# Patient Record
Sex: Male | Born: 1950 | Race: White | Hispanic: No | Marital: Married | Smoking: Never smoker
Health system: Southern US, Community
[De-identification: ages and names within clinical notes are randomized; demographics above are authoritative.]

## PROBLEM LIST (undated history)

## (undated) DIAGNOSIS — K760 Fatty (change of) liver, not elsewhere classified: Secondary | ICD-10-CM

## (undated) DIAGNOSIS — E119 Type 2 diabetes mellitus without complications: Secondary | ICD-10-CM

## (undated) DIAGNOSIS — Z950 Presence of cardiac pacemaker: Secondary | ICD-10-CM

## (undated) DIAGNOSIS — I499 Cardiac arrhythmia, unspecified: Secondary | ICD-10-CM

## (undated) DIAGNOSIS — I6529 Occlusion and stenosis of unspecified carotid artery: Secondary | ICD-10-CM

## (undated) DIAGNOSIS — M51369 Other intervertebral disc degeneration, lumbar region without mention of lumbar back pain or lower extremity pain: Secondary | ICD-10-CM

## (undated) DIAGNOSIS — N200 Calculus of kidney: Secondary | ICD-10-CM

## (undated) DIAGNOSIS — I739 Peripheral vascular disease, unspecified: Secondary | ICD-10-CM

## (undated) DIAGNOSIS — I4891 Unspecified atrial fibrillation: Secondary | ICD-10-CM

## (undated) DIAGNOSIS — E538 Deficiency of other specified B group vitamins: Secondary | ICD-10-CM

## (undated) DIAGNOSIS — E669 Obesity, unspecified: Secondary | ICD-10-CM

## (undated) DIAGNOSIS — Z8739 Personal history of other diseases of the musculoskeletal system and connective tissue: Secondary | ICD-10-CM

## (undated) DIAGNOSIS — M5136 Other intervertebral disc degeneration, lumbar region: Secondary | ICD-10-CM

## (undated) DIAGNOSIS — Z87442 Personal history of urinary calculi: Secondary | ICD-10-CM

## (undated) DIAGNOSIS — L565 Disseminated superficial actinic porokeratosis (DSAP): Secondary | ICD-10-CM

## (undated) DIAGNOSIS — I10 Essential (primary) hypertension: Secondary | ICD-10-CM

## (undated) DIAGNOSIS — I509 Heart failure, unspecified: Secondary | ICD-10-CM

## (undated) DIAGNOSIS — E7800 Pure hypercholesterolemia, unspecified: Secondary | ICD-10-CM

## (undated) DIAGNOSIS — G459 Transient cerebral ischemic attack, unspecified: Secondary | ICD-10-CM

## (undated) HISTORY — PX: CARDIAC CATHETERIZATION: SHX172

---

## 2002-01-27 HISTORY — PX: APPENDECTOMY: SHX54

## 2003-01-28 HISTORY — PX: CARDIAC PACEMAKER PLACEMENT: SHX583

## 2003-05-06 ENCOUNTER — Other Ambulatory Visit: Payer: Self-pay

## 2003-05-08 ENCOUNTER — Other Ambulatory Visit: Payer: Self-pay

## 2006-12-07 DIAGNOSIS — I10 Essential (primary) hypertension: Secondary | ICD-10-CM | POA: Insufficient documentation

## 2006-12-07 DIAGNOSIS — I4891 Unspecified atrial fibrillation: Secondary | ICD-10-CM | POA: Insufficient documentation

## 2006-12-11 ENCOUNTER — Ambulatory Visit: Payer: Self-pay | Admitting: Cardiovascular Disease

## 2007-01-11 ENCOUNTER — Ambulatory Visit: Payer: Self-pay | Admitting: Family Medicine

## 2007-01-11 DIAGNOSIS — M9979 Connective tissue and disc stenosis of intervertebral foramina of abdomen and other regions: Secondary | ICD-10-CM | POA: Insufficient documentation

## 2007-02-05 DIAGNOSIS — G4700 Insomnia, unspecified: Secondary | ICD-10-CM | POA: Insufficient documentation

## 2008-01-05 ENCOUNTER — Ambulatory Visit: Payer: Self-pay | Admitting: Pain Medicine

## 2008-04-24 ENCOUNTER — Ambulatory Visit: Payer: Self-pay | Admitting: Pain Medicine

## 2008-05-09 ENCOUNTER — Ambulatory Visit: Payer: Self-pay | Admitting: Pain Medicine

## 2008-05-24 ENCOUNTER — Ambulatory Visit: Payer: Self-pay | Admitting: Physician Assistant

## 2008-06-29 ENCOUNTER — Ambulatory Visit: Payer: Self-pay | Admitting: Pain Medicine

## 2008-08-01 ENCOUNTER — Ambulatory Visit: Payer: Self-pay | Admitting: Pain Medicine

## 2008-09-12 ENCOUNTER — Emergency Department: Payer: Self-pay

## 2008-09-29 DIAGNOSIS — I10 Essential (primary) hypertension: Secondary | ICD-10-CM | POA: Insufficient documentation

## 2008-12-11 ENCOUNTER — Ambulatory Visit: Payer: Self-pay | Admitting: Pain Medicine

## 2008-12-19 ENCOUNTER — Ambulatory Visit: Payer: Self-pay | Admitting: Pain Medicine

## 2009-01-01 ENCOUNTER — Ambulatory Visit: Payer: Self-pay | Admitting: Pain Medicine

## 2009-01-27 DIAGNOSIS — G459 Transient cerebral ischemic attack, unspecified: Secondary | ICD-10-CM

## 2009-01-27 HISTORY — DX: Transient cerebral ischemic attack, unspecified: G45.9

## 2009-03-29 ENCOUNTER — Ambulatory Visit: Payer: Self-pay | Admitting: Pain Medicine

## 2009-05-29 DIAGNOSIS — I519 Heart disease, unspecified: Secondary | ICD-10-CM | POA: Insufficient documentation

## 2009-06-11 ENCOUNTER — Ambulatory Visit: Payer: Self-pay | Admitting: Pain Medicine

## 2010-05-30 ENCOUNTER — Emergency Department: Payer: Self-pay | Admitting: Emergency Medicine

## 2012-09-06 DIAGNOSIS — IMO0001 Reserved for inherently not codable concepts without codable children: Secondary | ICD-10-CM | POA: Insufficient documentation

## 2012-09-06 DIAGNOSIS — R339 Retention of urine, unspecified: Secondary | ICD-10-CM | POA: Insufficient documentation

## 2012-09-06 DIAGNOSIS — N509 Disorder of male genital organs, unspecified: Secondary | ICD-10-CM | POA: Insufficient documentation

## 2012-09-06 DIAGNOSIS — N138 Other obstructive and reflux uropathy: Secondary | ICD-10-CM | POA: Insufficient documentation

## 2012-09-08 ENCOUNTER — Ambulatory Visit: Payer: Self-pay | Admitting: Urology

## 2012-09-08 LAB — CBC WITH DIFFERENTIAL/PLATELET
Basophil #: 0 10*3/uL (ref 0.0–0.1)
Basophil %: 0.8 %
Eosinophil #: 0.1 10*3/uL (ref 0.0–0.7)
Eosinophil %: 3.1 %
HCT: 43.6 % (ref 40.0–52.0)
HGB: 15.4 g/dL (ref 13.0–18.0)
Lymphocyte #: 1.3 10*3/uL (ref 1.0–3.6)
Lymphocyte %: 33.4 %
MCH: 34.9 pg — ABNORMAL HIGH (ref 26.0–34.0)
MCHC: 35.2 g/dL (ref 32.0–36.0)
MCV: 99 fL (ref 80–100)
Monocyte #: 0.4 x10 3/mm (ref 0.2–1.0)
Monocyte %: 9.8 %
Neutrophil #: 2.1 10*3/uL (ref 1.4–6.5)
Neutrophil %: 52.9 %
Platelet: 151 10*3/uL (ref 150–440)
RBC: 4.41 10*6/uL (ref 4.40–5.90)
RDW: 13.4 % (ref 11.5–14.5)
WBC: 4 10*3/uL (ref 3.8–10.6)

## 2012-09-08 LAB — BASIC METABOLIC PANEL
Anion Gap: 7 (ref 7–16)
BUN: 9 mg/dL (ref 7–18)
Calcium, Total: 9.3 mg/dL (ref 8.5–10.1)
Chloride: 104 mmol/L (ref 98–107)
Co2: 28 mmol/L (ref 21–32)
Creatinine: 0.95 mg/dL (ref 0.60–1.30)
EGFR (African American): >60
EGFR (Non-African Amer.): >60
Glucose: 263 mg/dL — ABNORMAL HIGH (ref 65–99)
Osmolality: 285 (ref 275–301)
Potassium: 4 mmol/L (ref 3.5–5.1)
Sodium: 139 mmol/L (ref 136–145)

## 2012-09-21 ENCOUNTER — Ambulatory Visit: Payer: Self-pay | Admitting: Urology

## 2012-09-21 LAB — PROTIME-INR
INR: 1.3
Prothrombin Time: 15.8 secs — ABNORMAL HIGH (ref 11.5–14.7)

## 2012-09-22 LAB — PATHOLOGY REPORT

## 2012-10-07 DIAGNOSIS — N480 Leukoplakia of penis: Secondary | ICD-10-CM | POA: Insufficient documentation

## 2013-01-10 ENCOUNTER — Ambulatory Visit: Payer: Self-pay | Admitting: Family Medicine

## 2013-06-30 ENCOUNTER — Ambulatory Visit
Admission: RE | Admit: 2013-06-30 | Discharge: 2013-06-30 | Disposition: A | Discharge status: Home / Self Care | Payer: BC Managed Care – PPO | Source: Ambulatory Visit | Attending: Physical Medicine and Rehabilitation | Admitting: Physical Medicine and Rehabilitation

## 2013-06-30 ENCOUNTER — Other Ambulatory Visit: Payer: Self-pay | Admitting: Physical Medicine and Rehabilitation

## 2013-06-30 DIAGNOSIS — M25551 Pain in right hip: Secondary | ICD-10-CM

## 2013-09-14 ENCOUNTER — Inpatient Hospital Stay: Payer: Self-pay | Admitting: Internal Medicine

## 2013-09-14 DIAGNOSIS — K5289 Other specified noninfective gastroenteritis and colitis: Secondary | ICD-10-CM | POA: Diagnosis present

## 2013-09-14 DIAGNOSIS — L02419 Cutaneous abscess of limb, unspecified: Secondary | ICD-10-CM | POA: Diagnosis present

## 2013-09-14 DIAGNOSIS — Z950 Presence of cardiac pacemaker: Secondary | ICD-10-CM | POA: Diagnosis not present

## 2013-09-14 DIAGNOSIS — I4891 Unspecified atrial fibrillation: Secondary | ICD-10-CM | POA: Diagnosis present

## 2013-09-14 DIAGNOSIS — Z7982 Long term (current) use of aspirin: Secondary | ICD-10-CM | POA: Diagnosis not present

## 2013-09-14 DIAGNOSIS — Z7901 Long term (current) use of anticoagulants: Secondary | ICD-10-CM | POA: Diagnosis not present

## 2013-09-14 DIAGNOSIS — I2510 Atherosclerotic heart disease of native coronary artery without angina pectoris: Secondary | ICD-10-CM | POA: Diagnosis present

## 2013-09-14 DIAGNOSIS — I509 Heart failure, unspecified: Secondary | ICD-10-CM | POA: Diagnosis present

## 2013-09-14 DIAGNOSIS — I5023 Acute on chronic systolic (congestive) heart failure: Secondary | ICD-10-CM | POA: Diagnosis present

## 2013-09-14 DIAGNOSIS — E119 Type 2 diabetes mellitus without complications: Secondary | ICD-10-CM | POA: Diagnosis present

## 2013-09-14 DIAGNOSIS — Z882 Allergy status to sulfonamides status: Secondary | ICD-10-CM | POA: Diagnosis not present

## 2013-09-14 DIAGNOSIS — I10 Essential (primary) hypertension: Secondary | ICD-10-CM | POA: Diagnosis present

## 2013-09-14 DIAGNOSIS — D696 Thrombocytopenia, unspecified: Secondary | ICD-10-CM | POA: Diagnosis present

## 2013-09-14 DIAGNOSIS — A419 Sepsis, unspecified organism: Secondary | ICD-10-CM | POA: Diagnosis present

## 2013-09-14 DIAGNOSIS — L03119 Cellulitis of unspecified part of limb: Secondary | ICD-10-CM | POA: Diagnosis not present

## 2013-09-14 LAB — URINALYSIS, COMPLETE
Bacteria: NONE SEEN
Bilirubin,UR: NEGATIVE
Glucose,UR: 50 mg/dL (ref 0–75)
Leukocyte Esterase: NEGATIVE
Nitrite: NEGATIVE
Ph: 5 (ref 4.5–8.0)
Protein: NEGATIVE
RBC,UR: 1 /HPF (ref 0–5)
Specific Gravity: 1.041 (ref 1.003–1.030)
Squamous Epithelial: <1
WBC UR: 1 /HPF (ref 0–5)

## 2013-09-14 LAB — CBC WITH DIFFERENTIAL/PLATELET
Basophil #: 0 10*3/uL (ref 0.0–0.1)
Basophil %: 0.2 %
Eosinophil #: 0 10*3/uL (ref 0.0–0.7)
Eosinophil %: 0.4 %
HCT: 45.8 % (ref 40.0–52.0)
HGB: 15.4 g/dL (ref 13.0–18.0)
Lymphocyte #: 0.8 10*3/uL — ABNORMAL LOW (ref 1.0–3.6)
Lymphocyte %: 7.3 %
MCH: 34.3 pg — ABNORMAL HIGH (ref 26.0–34.0)
MCHC: 33.6 g/dL (ref 32.0–36.0)
MCV: 102 fL — ABNORMAL HIGH (ref 80–100)
Monocyte #: 0.5 x10 3/mm (ref 0.2–1.0)
Monocyte %: 4.5 %
Neutrophil #: 9.5 10*3/uL — ABNORMAL HIGH (ref 1.4–6.5)
Neutrophil %: 87.6 %
Platelet: 132 10*3/uL — ABNORMAL LOW (ref 150–440)
RBC: 4.48 10*6/uL (ref 4.40–5.90)
RDW: 13.3 % (ref 11.5–14.5)
WBC: 10.8 10*3/uL — ABNORMAL HIGH (ref 3.8–10.6)

## 2013-09-14 LAB — COMPREHENSIVE METABOLIC PANEL
Albumin: 3.3 g/dL — ABNORMAL LOW (ref 3.4–5.0)
Alkaline Phosphatase: 122 U/L — ABNORMAL HIGH
Anion Gap: 14 (ref 7–16)
BUN: 7 mg/dL (ref 7–18)
Bilirubin,Total: 1.6 mg/dL — ABNORMAL HIGH (ref 0.2–1.0)
Calcium, Total: 8.7 mg/dL (ref 8.5–10.1)
Chloride: 104 mmol/L (ref 98–107)
Co2: 23 mmol/L (ref 21–32)
Creatinine: 1.02 mg/dL (ref 0.60–1.30)
EGFR (African American): >60
EGFR (Non-African Amer.): >60
Glucose: 189 mg/dL — ABNORMAL HIGH (ref 65–99)
Osmolality: 284 (ref 275–301)
Potassium: 3.9 mmol/L (ref 3.5–5.1)
SGOT(AST): 71 U/L — ABNORMAL HIGH (ref 15–37)
SGPT (ALT): 39 U/L
Sodium: 141 mmol/L (ref 136–145)
Total Protein: 7.5 g/dL (ref 6.4–8.2)

## 2013-09-14 LAB — PROTIME-INR
INR: 2.5
Prothrombin Time: 26 secs — ABNORMAL HIGH (ref 11.5–14.7)

## 2013-09-14 LAB — RAPID INFLUENZA A&B ANTIGENS

## 2013-09-14 LAB — MAGNESIUM: Magnesium: 1.3 mg/dL — ABNORMAL LOW

## 2013-09-14 LAB — TROPONIN I: Troponin-I: <0.02 ng/mL

## 2013-09-14 LAB — PHOSPHORUS: Phosphorus: 1.1 mg/dL — ABNORMAL LOW (ref 2.5–4.9)

## 2013-09-15 LAB — COMPREHENSIVE METABOLIC PANEL
Albumin: 2.5 g/dL — ABNORMAL LOW (ref 3.4–5.0)
Alkaline Phosphatase: 62 U/L
Anion Gap: 10 (ref 7–16)
BUN: 6 mg/dL — ABNORMAL LOW (ref 7–18)
Bilirubin,Total: 1.4 mg/dL — ABNORMAL HIGH (ref 0.2–1.0)
Calcium, Total: 7.4 mg/dL — ABNORMAL LOW (ref 8.5–10.1)
Chloride: 109 mmol/L — ABNORMAL HIGH (ref 98–107)
Co2: 23 mmol/L (ref 21–32)
Creatinine: 0.92 mg/dL (ref 0.60–1.30)
EGFR (African American): >60
EGFR (Non-African Amer.): >60
Glucose: 156 mg/dL — ABNORMAL HIGH (ref 65–99)
Osmolality: 284 (ref 275–301)
Potassium: 3.6 mmol/L (ref 3.5–5.1)
SGOT(AST): 46 U/L — ABNORMAL HIGH (ref 15–37)
SGPT (ALT): 29 U/L
Sodium: 142 mmol/L (ref 136–145)
Total Protein: 5.4 g/dL — ABNORMAL LOW (ref 6.4–8.2)

## 2013-09-15 LAB — CBC WITH DIFFERENTIAL/PLATELET
Basophil #: 0 10*3/uL (ref 0.0–0.1)
Basophil %: 0.2 %
Eosinophil #: 0 10*3/uL (ref 0.0–0.7)
Eosinophil %: 0.3 %
HCT: 35.5 % — ABNORMAL LOW (ref 40.0–52.0)
HGB: 12 g/dL — ABNORMAL LOW (ref 13.0–18.0)
Lymphocyte #: 0.6 10*3/uL — ABNORMAL LOW (ref 1.0–3.6)
Lymphocyte %: 10.6 %
MCH: 34.2 pg — ABNORMAL HIGH (ref 26.0–34.0)
MCHC: 33.7 g/dL (ref 32.0–36.0)
MCV: 102 fL — ABNORMAL HIGH (ref 80–100)
Monocyte #: 0.4 x10 3/mm (ref 0.2–1.0)
Monocyte %: 7.2 %
Neutrophil #: 4.7 10*3/uL (ref 1.4–6.5)
Neutrophil %: 81.7 %
Platelet: 84 10*3/uL — ABNORMAL LOW (ref 150–440)
RBC: 3.5 10*6/uL — ABNORMAL LOW (ref 4.40–5.90)
RDW: 13.3 % (ref 11.5–14.5)
WBC: 5.7 10*3/uL (ref 3.8–10.6)

## 2013-09-15 LAB — PROTIME-INR
INR: 2.6
Prothrombin Time: 27.4 secs — ABNORMAL HIGH (ref 11.5–14.7)

## 2013-09-15 LAB — WBCS, STOOL

## 2013-09-15 LAB — TROPONIN I
Troponin-I: <0.02 ng/mL
Troponin-I: 0.02 ng/mL

## 2013-09-15 LAB — CLOSTRIDIUM DIFFICILE(ARMC)

## 2013-09-16 DIAGNOSIS — I369 Nonrheumatic tricuspid valve disorder, unspecified: Secondary | ICD-10-CM

## 2013-09-16 LAB — BASIC METABOLIC PANEL
Anion Gap: 11 (ref 7–16)
BUN: 5 mg/dL — ABNORMAL LOW (ref 7–18)
Calcium, Total: 7.7 mg/dL — ABNORMAL LOW (ref 8.5–10.1)
Chloride: 107 mmol/L (ref 98–107)
Co2: 24 mmol/L (ref 21–32)
Creatinine: 0.97 mg/dL (ref 0.60–1.30)
EGFR (African American): >60
EGFR (Non-African Amer.): >60
Glucose: 232 mg/dL — ABNORMAL HIGH (ref 65–99)
Osmolality: 288 (ref 275–301)
Potassium: 3.4 mmol/L — ABNORMAL LOW (ref 3.5–5.1)
Sodium: 142 mmol/L (ref 136–145)

## 2013-09-16 LAB — MAGNESIUM: Magnesium: 1.9 mg/dL

## 2013-09-16 LAB — CBC WITH DIFFERENTIAL/PLATELET
Basophil #: 0 10*3/uL (ref 0.0–0.1)
Basophil %: 0.1 %
Eosinophil #: 0.1 10*3/uL (ref 0.0–0.7)
Eosinophil %: 1 %
HCT: 36.8 % — ABNORMAL LOW (ref 40.0–52.0)
HGB: 12.8 g/dL — ABNORMAL LOW (ref 13.0–18.0)
Lymphocyte #: 0.8 10*3/uL — ABNORMAL LOW (ref 1.0–3.6)
Lymphocyte %: 14.4 %
MCH: 35.4 pg — ABNORMAL HIGH (ref 26.0–34.0)
MCHC: 34.8 g/dL (ref 32.0–36.0)
MCV: 102 fL — ABNORMAL HIGH (ref 80–100)
Monocyte #: 0.5 x10 3/mm (ref 0.2–1.0)
Monocyte %: 9.2 %
Neutrophil #: 4.4 10*3/uL (ref 1.4–6.5)
Neutrophil %: 75.3 %
Platelet: 89 10*3/uL — ABNORMAL LOW (ref 150–440)
RBC: 3.62 10*6/uL — ABNORMAL LOW (ref 4.40–5.90)
RDW: 13.7 % (ref 11.5–14.5)
WBC: 5.8 10*3/uL (ref 3.8–10.6)

## 2013-09-16 LAB — PHOSPHORUS: Phosphorus: 1.7 mg/dL — ABNORMAL LOW (ref 2.5–4.9)

## 2013-09-16 LAB — URINE CULTURE

## 2013-09-16 LAB — PROTIME-INR
INR: 2.4
Prothrombin Time: 25.4 secs — ABNORMAL HIGH (ref 11.5–14.7)

## 2013-09-17 LAB — CREATININE, SERUM
Creatinine: 0.89 mg/dL (ref 0.60–1.30)
EGFR (African American): >60
EGFR (Non-African Amer.): >60

## 2013-09-17 LAB — PROTIME-INR
INR: 2.1
Prothrombin Time: 22.9 secs — ABNORMAL HIGH (ref 11.5–14.7)

## 2013-09-17 LAB — VANCOMYCIN, TROUGH: Vancomycin, Trough: 9 ug/mL — ABNORMAL LOW (ref 10–20)

## 2013-09-17 LAB — PLATELET COUNT: Platelet: 107 10*3/uL — ABNORMAL LOW (ref 150–440)

## 2013-09-17 LAB — STOOL CULTURE

## 2013-09-18 LAB — PROTIME-INR
INR: 2
Prothrombin Time: 21.9 secs — ABNORMAL HIGH (ref 11.5–14.7)

## 2013-09-19 LAB — PROTIME-INR
INR: 1.7
Prothrombin Time: 19.5 secs — ABNORMAL HIGH (ref 11.5–14.7)

## 2013-09-20 LAB — CULTURE, BLOOD (SINGLE)

## 2013-09-21 LAB — CULTURE, BLOOD (SINGLE)

## 2013-10-24 DIAGNOSIS — I482 Chronic atrial fibrillation, unspecified: Secondary | ICD-10-CM | POA: Insufficient documentation

## 2013-12-27 DIAGNOSIS — I4891 Unspecified atrial fibrillation: Secondary | ICD-10-CM | POA: Diagnosis not present

## 2014-01-23 DIAGNOSIS — I10 Essential (primary) hypertension: Secondary | ICD-10-CM | POA: Diagnosis not present

## 2014-01-23 DIAGNOSIS — E119 Type 2 diabetes mellitus without complications: Secondary | ICD-10-CM | POA: Diagnosis not present

## 2014-01-23 DIAGNOSIS — E780 Pure hypercholesterolemia: Secondary | ICD-10-CM | POA: Diagnosis not present

## 2014-02-15 DIAGNOSIS — I6529 Occlusion and stenosis of unspecified carotid artery: Secondary | ICD-10-CM | POA: Insufficient documentation

## 2014-02-15 DIAGNOSIS — I6523 Occlusion and stenosis of bilateral carotid arteries: Secondary | ICD-10-CM | POA: Insufficient documentation

## 2014-04-24 LAB — HEMOGLOBIN A1C: Hgb A1c MFr Bld: 7.5 % — AB (ref 4.0–6.0)

## 2014-05-02 DIAGNOSIS — H2513 Age-related nuclear cataract, bilateral: Secondary | ICD-10-CM | POA: Diagnosis not present

## 2014-05-05 DIAGNOSIS — I4891 Unspecified atrial fibrillation: Secondary | ICD-10-CM | POA: Diagnosis not present

## 2014-05-05 DIAGNOSIS — Z87440 Personal history of urinary (tract) infections: Secondary | ICD-10-CM | POA: Diagnosis not present

## 2014-05-05 DIAGNOSIS — K521 Toxic gastroenteritis and colitis: Secondary | ICD-10-CM | POA: Diagnosis not present

## 2014-05-05 LAB — POCT INR: INR: 2.5 — AB (ref <=1.1)

## 2014-05-19 NOTE — Op Note (Signed)
PATIENT NAME:  Christian Berg, Christian Berg MR#:  [id number] DATE OF BIRTH:  April 30, 1950  DATE OF PROCEDURE:  09/21/2012  PRINCIPAL DIAGNOSES: Severe phimosis, glandular adhesions, neoplasm uncertain of the scrotal skin.   POSTOPERATIVE DIAGNOSES: Severe phimosis, glandular adhesions, neoplasm uncertain of the scrotal skin.   PROCEDURE: Circumcision. Scrotal biopsy.   SURGEON: Edrick Oh, M.D.   ANESTHESIA: General endotracheal anesthesia.   ESTIMATED BLOOD LOSS: Minimal.   PATHOLOGY: Foreskin and scrotal biopsy.   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 He has developed significant difficulty with urination due to the degree of phimosis. He has developed recent urinary tract infections also related to the degree of phimosis. He presents for circumcision.   DESCRIPTION OF PROCEDURE: After informed consent was obtained, the patient was taken to the operating room and placed in the supine position on the operating table under laryngeal mask airway anesthesia. The patient was then prepped and draped in the usual standard fashion. The penis was noted to be buried in a large suprapubic fat pad. This required extensive manipulation for attempts at visualization. The penis was unable to be visualized due to the degree of severe phimosis. The glans and other structures were not visualized.   The decision was made to proceed with a dorsal slit. A straight hemostat was placed on the dorsal portion of the phimotic foreskin. It was clamped for approximately two minutes. An incision was made. The penis was able to be extruded from the tissue. Additional incision was made after clamping with a straight clamp. The penis was able to be removed from the subcutaneous fat pad. The penis was then cleaned with additional Betadine solution once the penis was visible. Extensive glandular adhesions of the foreskin were noted from the mid  glans penis to the coronal sulcus. These were separated using both sharp and blunt dissection.   Once a significant portion of the glandular adhesions were separated, the coronal sulcus was able to be identified. A circumferential incision was made approximately 2 mm proximal to the area of the coronal sulcus. Areas of bleeding and ooze were present due to the degree of adhesion. These were cauterized utilizing electrocautery as needed for hemostasis.   Once the circumferential incision was made behind the glans penis, the foreskin was withdrawn back over the glans penis. A hemostat was placed at the 6 o'clock and 12 o'clock positions just behind the area of incision. A circumferential incision was made at this area. Once the incisions were made, a tunnel was made under the remaining foreskin cuff. It was incised utilizing electrocautery. The electrocautery was then utilized to remove the foreskin's cuff circumferentially. Once the foreskin cuff was removed, several areas of bleeding were identified. These were cauterized utilizing electrocautery. Overall blood loss was minimal.   Once adequate hemostasis was obtained, the foreskin was secured to the segment of skin remaining behind the coronal sulcus at the 6 o'clock and 12 o'clock positions. The skin was noted to be very friable due to the degree of adhesion and inflammation. Additional interrupted 4-0 Monocryl sutures were placed circumferentially to complete the reanastomosis. Several of the stitches had to be replaced due to pull-through due to the poor condition of the tissue.   The overall procedure was complicated due to the buried penis, which required significant manipulation for visualization as well as the glandular adhesions. Once the reanastomosis was completed, a Vaseline gauze, Kling, and Coban dressing was applied. This  also required significant pressure to the surrounding suprapubic fat pad for visualization. Approximately 10 mL of 0.5%  Marcaine was instilled along the base of the penis.   The patient was also noted to have an abnormal area on his scrotum. This was approximately 3 cm in size. It was raised, white in nature, and very atypical in appearance. The decision was made to proceed with a scrotal biopsy. A 5 mm biopsy device was utilized for a circumferential biopsy of the mid aspect of the lesion. The incision was made through the full dermis layer. Iris scissors were then utilized to remove the segment. This will be sent for pathology analysis for evaluation.   The base of the incision was cauterized utilizing electrocautery, and 4-0 Monocryl sutures were placed for anastomosis of the tissue. Bacitracin was applied to the scrotal incision site. The patient was then awakened from laryngeal mask airway anesthesia and was taken to the recovery room in stable condition. There were no problems or complications. The patient tolerated the procedure well.     ____________________________ Denice Bors. Jacqlyn Larsen, MD bsc:np D: 09/22/2012 20:57:35 ET T: 09/22/2012 21:50:15 ET JOB#: [id number]  cc: Denice Bors. Jacqlyn Larsen, MD, <Dictator> Denice Bors Kharee Lesesne MD ELECTRONICALLY SIGNED 10/05/2012 7:09

## 2014-05-20 NOTE — H&P (Signed)
PATIENT NAME:  Christian Berg, Christian Berg MR#:  [id number] DATE OF BIRTH:  [birth year]  DATE OF ADMISSION:  09/14/2013  REFERRING PHYSICIAN:  Dr. Milana Huntsman  PRIMARY CARE PHYSICIAN: [facility], Dr. Lorelle Formosa.  CHIEF COMPLAINT: Fever, nausea, abdominal pain, confusion, chest pain.   HISTORY OF PRESENT ILLNESS: This is a 64 year old male with known history of atrial fibrillation on anticoagulation with warfarin, coronary artery disease, diabetes, hypertension, who presents with above-mentioned complaints. Reports all his symptoms started today. Reports he has is having some fever, chills, nausea and abdominal pain, reports that abdominal pain is not related to eating. Reports having diarrhea up to between 3 to 4 bowel movements, loose, very watery at home. Denies any sick contacts. As well, the patient was febrile here in the Emergency Room at 103.1 upon presentation. He denies any cough, any productive sputum, any headache any neck pain, back pain or any dysuria or polyuria. The patient did not have any significant  leukocytosis. White blood cell count was at 10.8,  urinalysis was negative. The patient had CT abdomen and pelvis given his abdominal pain, which did not show any acute inflammatory process. As well, he had right upper quadrant ultrasound which did show a normal gallbladder and normal caliber common bile duct. The patient was started on broad-spectrum antibiotics, vancomycin and Zosyn in the ED, and had blood cultures sent.  The patient also had complaints of midsternal chest pain, nonradiating, currently resolved without any intervention. The patient had negative troponins. Hospitalist requested to admit the patient for further evaluation.   PAST MEDICAL HISTORY:  1. Atrial fibrillation. 2. Coronary artery disease.  3. Diabetes.  4. Hypertension.   PAST SURGICAL HISTORY:  1. Appendectomy.  2. Pacemaker.   SOCIAL HISTORY: No smoking. No alcohol. No illicit drug use.    FAMILY HISTORY: The patient is adopted and unaware of any family history.   ALLERGIES: SULFA DRUGS.   HOME MEDICATIONS:  1. Metoprolol 100 mg oral daily and 50 mg at bedtime.  2. Acetaminophen/Tramadol 3 tablets once a day and 2 tablets in the evening.  3. Aspirin 81 mg in the evening.  4. Norco 325 mg/5 mg oral 1 to 2 tablets every 4 to 6 hours.  5. Lisinopril 20 mg oral in the morning.  6. Warfarin 7.5 mg oral daily.  7. Metformin 850 mg oral 2 times a day.  8. Pravastatin 20 mg oral in the evening.   REVIEW OF SYSTEMS:  CONSTITUTIONAL: Reports fever, chills, fatigue, weakness.  EYES: Denies blurry vision or inflammation.  EARS, NOSE AND THROAT: Denies tinnitus, ear pain, hearing loss, gross epistaxis.  RESPIRATORY: Denies cough, wheezing, hemoptysis, dyspnea.  CARDIOVASCULAR: Reports episode of chest pain as well. Denies edema, palpitations, syncope.  GASTROINTESTINAL: Reports abdominal pain and diarrhea. Denies coffee-ground emesis, bright red blood per rectum. Reports nausea. Denies any vomiting.  GENITOURINARY: Denies dysuria, hematuria or renal colic.  ENDOCRINE: Denies polyuria, polydipsia, heat or cold intolerance.  HEMATOLOGY: Denies anemia, easy bruising, bleeding.  INTEGUMENT: Denies acne. Reports chronic skin rash and lesion.  MUSCULOSKELETAL: Denies any swelling, gout, arthritis, cramps.  NEUROLOGIC: Denies CVA, TIA, dementia, ataxia. PSYCHIATRIC: Denies anxiety, insomnia, or depression.   PHYSICAL EXAMINATION:  VITAL SIGNS: Temperature 99.4, pulse 119, respiratory rate 20, blood pressure 147/72, saturating 98% on room air. T-max was 103.1 on presentation.  GENERAL: Morbidly obese male who looks comfortable in bed, in no apparent distress.  HEENT: Head is atraumatic, normocephalic. Pupils equal, reactive to light. Pink conjunctivae. Anicteric sclerae.  Moist oral mucosa.  NECK: Supple. No thyromegaly. No JVD.   CHEST: Good air entry bilaterally. No wheezing, rales  or rhonchi. No use of accessory muscles.  CARDIOVASCULAR: S1, S2 heard. No rubs, murmurs, or gallops. Regular, slightly tachycardic. PMI nondisplaced.  ABDOMEN: Obese, soft, nontender, nondistended. Bowel sounds present. No rebound, no guarding.  EXTREMITIES: +2 edema bilaterally which is chronic with chronic skin rash. No clubbing. No cyanosis. Pedal pulses felt bilaterally.  PSYCHIATRIC: Appropriate affect. Awake, alert x 3. Intact judgment and insight.  NEUROLOGIC: Cranial nerves grossly intact. Motor 5/5. No focal deficits  MUSCULOSKELETAL: No joint effusion or erythema. LYMPHATICS: No cervical lymphadenopathy could be appreciated.   PERTINENT LABORATORY DATA: Glucose 189, BUN 7, creatinine 1.02, sodium 141, potassium 3.9, chloride 104, CO2 23, ALT 39, AST 71, alkaline phosphatase 122, total bilirubin 1.6, albumin 3.3. Troponin less than 0.02. White blood cells 10.8, hemoglobin 15.4, hematocrit 45.8, platelets 132. Urinalysis negative for leukocyte esterase and nitrite.   IMAGING STUDIES: CT abdomen and pelvis showing no acute inflammatory process and ultrasound showing common bile duct within normal caliber and no acute finding on gallbladder.   ASSESSMENT AND PLAN:  1. Systemic inflammatory response syndrome, the patient presents with fever, tachycardia and etiology so far is unclear started on broad-spectrum antibiotics including vancomycin and Zosyn. CT abdomen and pelvis, and ultrasound does not show any acute process. This is most likely related to  possible viral gastroenteritis, but the patient will be admitted for hydration, we will check his stool work-up including C. difficile, white blood cells and culture and sensitivity. We will continue him on Cipro and Flagyl pending his work-up. We will continue with hydration.  2. Atrial fibrillation resumed back on metoprolol. Heart rate is uncontrolled. We will adjust metoprolol if needed. We will consult pharmacy as far as warfarin. INR is  therapeutic.  3. Diabetes mellitus. Will hold metformin given the fact he received IV contrast, will continue with insulin sliding scale. 4. Coronary artery disease. We will resume back on aspirin when he is more stable.  5. Chest pain, currently resolved. Troponins are negative, we will give 1 time 324 milligrams of aspirin. Will cycle cardiac enzymes on telemetry.  6. Hypertension. Blood pressure acceptable. Continue with home medications.  7. Deep vein thrombosis prophylaxis. The patient is on full dose anticoagulation with warfarin and INR is therapeutic.  CODE STATUS: Full code.   TOTAL TIME SPENT ON ADMISSION AND PATIENT CARE: 55 minutes.   ____________________________ Albertine Patricia, MD dse:JT D: 09/15/2013 02:08:22 ET T: 09/15/2013 05:25:05 ET JOB#: [id number]  cc: Albertine Patricia, MD, <Dictator> Djon Tith Graciela Husbands MD ELECTRONICALLY SIGNED 09/16/2013 5:04

## 2014-05-20 NOTE — Discharge Summary (Signed)
PATIENT NAME:  Christian Berg, BUNYARD MR#:  [id number] DATE OF BIRTH:  1950-02-05  DATE OF ADMISSION:  09/14/2013 DATE OF DISCHARGE:  09/19/2013  ADMISSION DIAGNOSES:   1.  Systemic inflammatory response syndrome with fever, tachycardia,  2.  Atrial fibrillation.   DISCHARGE DIAGNOSES: 1.  Staphylococcus caprae bacteremia with right lower extremity cellulitis.  2.  Congestive heart failure, acute systolic exacerbation, in the setting of intravenous fluids.  3.  Gastroenteritis. 4.  Sepsis.   5.  Paroxysmal atrial fibrillation with rapid ventricular response due to sepsis, resolved.  6.  Thrombocytopenia.  7.  Diabetes.  8.  INR is 1.7.   OTHER DISCHARGE LABORATORIES: Her initial blood cultures were positive for Staphylococcus caprae.  Repeat blood cultures on the 21st of August were negative to date.  A 2D echocardiogram showed an EF of 40% to 45% with no vegetation nor thrombotic source. He has moderate concentric LVH. White blood cells 5.8, hemoglobin 12.8, hematocrit 36.8, platelets are 89,000.   HOSPITAL COURSE: A 64 year old male who presented with fever, tachycardia, found to have a cellulitis of his right lower extremity and subsequently had bacteremia. For further details, please refer to the H and P. 1.  Staphylococcus caprae bacteremia with right lower extremity cellulitis. I spoke with Dr. Ola Spurr from infectious disease, likely the Staphylococcus caprae is actually a contaminant as it is coagulase-negative staphylococcus. In any case, he did have a cellulitis, which caused his sepsis, and he was treated initially with vancomycin and changed to Levaquin. Cellulitis is much improved.  2.  Acute systolic CHF exacerbation in the setting of IV fluids; shortness of breath has improved. The patient will need to follow up with Dr. Nehemiah Massed to evaluate for ischemia. I did discuss this in great detail with the patient. The patient is on a beta blocker, ACE inhibitor, and he was started on a  Lasix.  3.  Gastroenteritis, resolved.  4.  His C. difficile was negative, tolerating his diet. 5.  Sepsis, resolved, secondary to diffuse cellulitis.  6. Paroxysmal atrial fibrillation with rapid ventricular response due to sepsis, which is resolved. His INR is 1.7 at discharge and will continue anticoagulation.  7.  Thrombocytopenia from sepsis, which has improved.   DISCHARGE MEDICATIONS: 1.  Aspirin 81 mg daily.  2.  Lisinopril 20 mg daily. 3.  Metformin 850 b.i.d.  4.  metoprol 50 mg 2 tablets in the morning, 1 tablet at bedtime.  5.  Coumadin 7.5 daily.  6.  Pravastatin 20 mg daily.  7.  Acetaminophen, tramadol 325/37.5 three tablets in the morning and 2 tablets in the evening.  8.  Norco 5/325, one to two tablets q. 4-6 hours p.r.n. pain. 9.  Levaquin 500 mg daily for 10 days.  10.  Lasix 20 mg daily.   DISCHARGE DIET: Low sodium.   DISCHARGE ACTIVITY: As tolerated.   DISCHARGE FOLLOWUP: The patient will have followup in 10 days with Dr. Nehemiah Massed and Maurine Minister, NP  TIME SPENT: Thirty-five minutes. The patient was stable for discharge.     ____________________________ Nilza Eaker P. Benjie Karvonen, MD spm:LT D: 09/19/2013 11:48:46 ET T: 09/19/2013 21:08:42 ET JOB#: [id number]  cc: Bunny Kleist P. Benjie Karvonen, MD, <Dictator> Meredith Leeds, NP Corey Skains, MD Donell Beers Haydn Hutsell MD ELECTRONICALLY SIGNED 09/20/2013 14:45

## 2014-05-30 DIAGNOSIS — N390 Urinary tract infection, site not specified: Secondary | ICD-10-CM | POA: Diagnosis not present

## 2014-05-30 DIAGNOSIS — K521 Toxic gastroenteritis and colitis: Secondary | ICD-10-CM | POA: Diagnosis not present

## 2014-05-30 DIAGNOSIS — I4891 Unspecified atrial fibrillation: Secondary | ICD-10-CM | POA: Diagnosis not present

## 2014-05-31 DIAGNOSIS — L565 Disseminated superficial actinic porokeratosis (DSAP): Secondary | ICD-10-CM | POA: Insufficient documentation

## 2014-05-31 DIAGNOSIS — H269 Unspecified cataract: Secondary | ICD-10-CM | POA: Insufficient documentation

## 2014-05-31 DIAGNOSIS — Z87442 Personal history of urinary calculi: Secondary | ICD-10-CM | POA: Insufficient documentation

## 2014-05-31 DIAGNOSIS — E7800 Pure hypercholesterolemia, unspecified: Secondary | ICD-10-CM | POA: Insufficient documentation

## 2014-05-31 DIAGNOSIS — E669 Obesity, unspecified: Secondary | ICD-10-CM | POA: Insufficient documentation

## 2014-05-31 DIAGNOSIS — J45909 Unspecified asthma, uncomplicated: Secondary | ICD-10-CM | POA: Insufficient documentation

## 2014-05-31 DIAGNOSIS — Q828 Other specified congenital malformations of skin: Secondary | ICD-10-CM

## 2014-05-31 DIAGNOSIS — I639 Cerebral infarction, unspecified: Secondary | ICD-10-CM | POA: Insufficient documentation

## 2014-05-31 DIAGNOSIS — E119 Type 2 diabetes mellitus without complications: Secondary | ICD-10-CM | POA: Insufficient documentation

## 2014-05-31 DIAGNOSIS — E538 Deficiency of other specified B group vitamins: Secondary | ICD-10-CM | POA: Insufficient documentation

## 2014-05-31 DIAGNOSIS — I5030 Unspecified diastolic (congestive) heart failure: Secondary | ICD-10-CM | POA: Insufficient documentation

## 2014-05-31 DIAGNOSIS — I5032 Chronic diastolic (congestive) heart failure: Secondary | ICD-10-CM | POA: Insufficient documentation

## 2014-06-06 DIAGNOSIS — H2513 Age-related nuclear cataract, bilateral: Secondary | ICD-10-CM | POA: Diagnosis not present

## 2014-06-07 DIAGNOSIS — K521 Toxic gastroenteritis and colitis: Secondary | ICD-10-CM | POA: Diagnosis not present

## 2014-06-07 DIAGNOSIS — N390 Urinary tract infection, site not specified: Secondary | ICD-10-CM | POA: Diagnosis not present

## 2014-06-07 DIAGNOSIS — I4891 Unspecified atrial fibrillation: Secondary | ICD-10-CM | POA: Diagnosis not present

## 2014-06-08 ENCOUNTER — Encounter: Payer: Self-pay | Admitting: *Deleted

## 2014-06-12 NOTE — Discharge Instructions (Signed)
Follow-Up Appointment is:  Thursday, May 19 @ 9:30 am    Cataract Surgery Care After Refer to this sheet in the next few weeks. These instructions provide you with information on caring for yourself after your procedure. Your caregiver may also give you more specific instructions. Your treatment has been planned according to current medical practices, but problems sometimes occur. Call your caregiver if you have any problems or questions after your procedure.  HOME CARE INSTRUCTIONS   Avoid strenuous activities as directed by your caregiver.  Ask your caregiver when you can resume driving.  Use eyedrops or other medicines to help healing and control pressure inside your eye as directed by your caregiver.  Only take over-the-counter or prescription medicines for pain, discomfort, or fever as directed by your caregiver.  Do not to touch or rub your eyes.  You may be instructed to use a protective shield during the first few days and nights after surgery. If not, wear sunglasses to protect your eyes. This is to protect the eye from pressure or from being accidentally bumped.  Keep the area around your eye clean and dry. Avoid swimming or allowing water to hit you directly in the face while showering. Keep soap and shampoo out of your eyes.  Do not bend or lift heavy objects. Bending increases pressure in the eye. You can walk, climb stairs, and do light household chores.  Do not put a contact lens into the eye that had surgery until your caregiver says it is okay to do so.  Ask your doctor when you can return to work. This will depend on the kind of work that you do. If you work in a dusty environment, you may be advised to wear protective eyewear for a period of time.  Ask your caregiver when it will be safe to engage in sexual activity.  Continue with your regular eye exams as directed by your caregiver. What to expect:  It is normal to feel itching and mild discomfort for a few days  after cataract surgery. Some fluid discharge is also common, and your eye may be sensitive to light and touch.  After 1 to 2 days, even moderate discomfort should disappear. In most cases, healing will take about 6 weeks.  If you received an intraocular lens (IOL), you may notice that colors are very bright or have a blue tinge. Also, if you have been in bright sunlight, everything may appear reddish for a few hours. If you see these color tinges, it is because your lens is clear and no longer cloudy. Within a few months after receiving an IOL, these extra colors should go away. When you have healed, you will probably need new glasses. SEEK MEDICAL CARE IF:   You have increased bruising around your eye.  You have discomfort not helped by medicine. SEEK IMMEDIATE MEDICAL CARE IF:   You have a fever.  You have a worsening or sudden vision loss.  You have redness, swelling, or increasing pain in the eye.  You have a thick discharge from the eye that had surgery. MAKE SURE YOU:  Understand these instructions.  Will watch your condition.  Will get help right away if you are not doing well or get worse. Document Released: 08/02/2004 Document Revised: 04/07/2011 Document Reviewed: 09/06/2010 [facility] Patient Information 2015 [place], [place]. This information is not intended to replace advice given to you by your health care provider. Make sure you discuss any questions you have with your health care provider.  General Anesthesia, Care After Refer to this sheet in the next few weeks. These instructions provide you with information on caring for yourself after your procedure. Your health care provider may also give you more specific instructions. Your treatment has been planned according to current medical practices, but problems sometimes occur. Call your health care provider if you have any problems or questions after your procedure. WHAT TO EXPECT AFTER THE PROCEDURE After the procedure,  it is typical to experience:  Sleepiness.  Nausea and vomiting. HOME CARE INSTRUCTIONS  For the first 24 hours after general anesthesia:  Have a responsible person with you.  Do not drive a car. If you are alone, do not take public transportation.  Do not drink alcohol.  Do not take medicine that has not been prescribed by your health care provider.  Do not sign important papers or make important decisions.  You may resume a normal diet and activities as directed by your health care provider.  Change bandages (dressings) as directed.  If you have questions or problems that seem related to general anesthesia, call the hospital and ask for the anesthetist or anesthesiologist on call. SEEK MEDICAL CARE IF:  You have nausea and vomiting that continue the day after anesthesia.  You develop a rash. SEEK IMMEDIATE MEDICAL CARE IF:   You have difficulty breathing.  You have chest pain.  You have any allergic problems. Document Released: 04/21/2000 Document Revised: 01/18/2013 Document Reviewed: 07/29/2012 [facility] Patient Information 2015 [place], [place]. This information is not intended to replace advice given to you by your health care provider. Make sure you discuss any questions you have with your health care provider.

## 2014-06-13 DIAGNOSIS — N390 Urinary tract infection, site not specified: Secondary | ICD-10-CM | POA: Diagnosis not present

## 2014-06-13 DIAGNOSIS — K521 Toxic gastroenteritis and colitis: Secondary | ICD-10-CM | POA: Diagnosis not present

## 2014-06-13 DIAGNOSIS — I4891 Unspecified atrial fibrillation: Secondary | ICD-10-CM | POA: Diagnosis not present

## 2014-06-14 ENCOUNTER — Encounter
Admission: RE | Disposition: A | Discharge status: Home / Self Care | Payer: Self-pay | Source: Ambulatory Visit | Attending: Ophthalmology

## 2014-06-14 ENCOUNTER — Ambulatory Visit: Payer: Medicare Other | Admitting: Anesthesiology

## 2014-06-14 ENCOUNTER — Ambulatory Visit
Admission: RE | Admit: 2014-06-14 | Discharge: 2014-06-14 | Disposition: A | Discharge status: Home / Self Care | Payer: Medicare Other | Source: Ambulatory Visit | Attending: Ophthalmology | Admitting: Ophthalmology

## 2014-06-14 DIAGNOSIS — I10 Essential (primary) hypertension: Secondary | ICD-10-CM | POA: Diagnosis not present

## 2014-06-14 DIAGNOSIS — H269 Unspecified cataract: Secondary | ICD-10-CM | POA: Diagnosis present

## 2014-06-14 DIAGNOSIS — E669 Obesity, unspecified: Secondary | ICD-10-CM | POA: Insufficient documentation

## 2014-06-14 DIAGNOSIS — E119 Type 2 diabetes mellitus without complications: Secondary | ICD-10-CM | POA: Insufficient documentation

## 2014-06-14 DIAGNOSIS — I6529 Occlusion and stenosis of unspecified carotid artery: Secondary | ICD-10-CM | POA: Diagnosis not present

## 2014-06-14 DIAGNOSIS — H2511 Age-related nuclear cataract, right eye: Secondary | ICD-10-CM | POA: Diagnosis not present

## 2014-06-14 DIAGNOSIS — Z9889 Other specified postprocedural states: Secondary | ICD-10-CM | POA: Diagnosis not present

## 2014-06-14 DIAGNOSIS — Z87442 Personal history of urinary calculi: Secondary | ICD-10-CM | POA: Insufficient documentation

## 2014-06-14 DIAGNOSIS — Z950 Presence of cardiac pacemaker: Secondary | ICD-10-CM | POA: Diagnosis not present

## 2014-06-14 DIAGNOSIS — L565 Disseminated superficial actinic porokeratosis (DSAP): Secondary | ICD-10-CM | POA: Diagnosis not present

## 2014-06-14 DIAGNOSIS — Z8673 Personal history of transient ischemic attack (TIA), and cerebral infarction without residual deficits: Secondary | ICD-10-CM | POA: Insufficient documentation

## 2014-06-14 DIAGNOSIS — H2513 Age-related nuclear cataract, bilateral: Secondary | ICD-10-CM | POA: Diagnosis not present

## 2014-06-14 HISTORY — DX: Pure hypercholesterolemia, unspecified: E78.00

## 2014-06-14 HISTORY — DX: Disseminated superficial actinic porokeratosis (DSAP): L56.5

## 2014-06-14 HISTORY — DX: Other intervertebral disc degeneration, lumbar region without mention of lumbar back pain or lower extremity pain: M51.369

## 2014-06-14 HISTORY — DX: Type 2 diabetes mellitus without complications: E11.9

## 2014-06-14 HISTORY — DX: Presence of cardiac pacemaker: Z95.0

## 2014-06-14 HISTORY — DX: Transient cerebral ischemic attack, unspecified: G45.9

## 2014-06-14 HISTORY — DX: Essential (primary) hypertension: I10

## 2014-06-14 HISTORY — DX: Calculus of kidney: N20.0

## 2014-06-14 HISTORY — PX: CATARACT EXTRACTION W/PHACO: SHX586

## 2014-06-14 HISTORY — DX: Cardiac arrhythmia, unspecified: I49.9

## 2014-06-14 HISTORY — DX: Other intervertebral disc degeneration, lumbar region: M51.36

## 2014-06-14 HISTORY — DX: Heart failure, unspecified: I50.9

## 2014-06-14 HISTORY — DX: Peripheral vascular disease, unspecified: I73.9

## 2014-06-14 LAB — GLUCOSE, CAPILLARY
Glucose-Capillary: 255 mg/dL — ABNORMAL HIGH (ref 65–99)
Glucose-Capillary: 253 mg/dL — ABNORMAL HIGH (ref 65–99)

## 2014-06-14 SURGERY — PHACOEMULSIFICATION, CATARACT, WITH IOL INSERTION
Anesthesia: Monitor Anesthesia Care | Laterality: Right

## 2014-06-14 MED ORDER — LABETALOL HCL 5 MG/ML IV SOLN
INTRAVENOUS | Status: DC | PRN
  Administered 2014-06-14: 5 mg via INTRAVENOUS

## 2014-06-14 MED ORDER — EPINEPHRINE HCL 1 MG/ML IJ SOLN
INTRAMUSCULAR | Status: DC | PRN
  Administered 2014-06-14: 1 mg

## 2014-06-14 MED ORDER — POVIDONE-IODINE 5 % OP SOLN
1.0000 "application " | Freq: Once | OPHTHALMIC | Status: AC
  Administered 2014-06-14: 1 via OPHTHALMIC

## 2014-06-14 MED ORDER — MIDAZOLAM HCL 2 MG/2ML IJ SOLN
INTRAMUSCULAR | Status: DC | PRN
  Administered 2014-06-14: 2 mg via INTRAVENOUS

## 2014-06-14 MED ORDER — TIMOLOL MALEATE 0.5 % OP SOLN
OPHTHALMIC | Status: DC | PRN
  Administered 2014-06-14: 1 [drp] via OPHTHALMIC

## 2014-06-14 MED ORDER — BRIMONIDINE TARTRATE 0.2 % OP SOLN
OPHTHALMIC | Status: DC | PRN
  Administered 2014-06-14: 1 [drp] via OPHTHALMIC

## 2014-06-14 MED ORDER — NA HYALUR & NA CHOND-NA HYALUR 0.4-0.35 ML IO KIT
PACK | INTRAOCULAR | Status: DC | PRN
  Administered 2014-06-14: 1 mL via INTRAOCULAR

## 2014-06-14 MED ORDER — ACETAMINOPHEN 160 MG/5ML PO SOLN: 325.0000 mg | ORAL | Status: DC | PRN

## 2014-06-14 MED ORDER — BSS IO SOLN
INTRAOCULAR | Status: DC | PRN
  Administered 2014-06-14: 20 mL via INTRAOCULAR
  Administered 2014-06-14: 88 mL via INTRAOCULAR

## 2014-06-14 MED ORDER — ACETAMINOPHEN 325 MG PO TABS: 325.0000 mg | ORAL_TABLET | ORAL | Status: DC | PRN

## 2014-06-14 MED ORDER — ARMC OPHTHALMIC DILATING GEL
1.0000 "application " | OPHTHALMIC | Status: DC | PRN
  Administered 2014-06-14 (×2): 1 via OPHTHALMIC

## 2014-06-14 MED ORDER — FENTANYL CITRATE (PF) 100 MCG/2ML IJ SOLN
INTRAMUSCULAR | Status: DC | PRN
  Administered 2014-06-14 (×2): 50 ug via INTRAVENOUS

## 2014-06-14 MED ORDER — TETRACAINE HCL 0.5 % OP SOLN
1.0000 [drp] | Freq: Once | OPHTHALMIC | Status: AC
  Administered 2014-06-14: 1 [drp] via OPHTHALMIC

## 2014-06-14 MED ORDER — CEFUROXIME OPHTHALMIC INJECTION 1 MG/0.1 ML
INJECTION | OPHTHALMIC | Status: DC | PRN
  Administered 2014-06-14: .3 mL via INTRACAMERAL

## 2014-06-14 SURGICAL SUPPLY — 25 items
CANNULA ANT/CHMB 27GA (MISCELLANEOUS) ×2 IMPLANT
GLOVE SURG LX 7.5 STRW (GLOVE) ×1
GLOVE SURG LX STRL 7.5 STRW (GLOVE) ×1 IMPLANT
GLOVE SURG TRIUMPH 8.0 PF LTX (GLOVE) ×2 IMPLANT
GOWN STRL REUS W/ TWL LRG LVL3 (GOWN DISPOSABLE) ×2 IMPLANT
GOWN STRL REUS W/TWL LRG LVL3 (GOWN DISPOSABLE) ×2
LENS IOL TECNIS 21.5 (Intraocular Lens) ×2 IMPLANT
LENS IOL TECNIS MONO 1P 21.5 (Intraocular Lens) ×1 IMPLANT
MARKER SKIN SURG W/RULER VIO (MISCELLANEOUS) ×2 IMPLANT
NDL RETROBULBAR .5 NSTRL (NEEDLE) IMPLANT
NEEDLE FILTER BLUNT 18X 1/2SAF (NEEDLE) ×1
NEEDLE FILTER BLUNT 18X1 1/2 (NEEDLE) ×1 IMPLANT
PACK CATARACT BRASINGTON (MISCELLANEOUS) ×2 IMPLANT
PACK EYE AFTER SURG (MISCELLANEOUS) ×2 IMPLANT
PACK OPTHALMIC (MISCELLANEOUS) ×2 IMPLANT
RING MALYGIN 7.0 (MISCELLANEOUS) IMPLANT
SUT ETHILON 10-0 CS-B-6CS-B-6 (SUTURE)
SUT VICRYL  9 0 (SUTURE)
SUT VICRYL 9 0 (SUTURE) IMPLANT
SUTURE EHLN 10-0 CS-B-6CS-B-6 (SUTURE) IMPLANT
SYR 3ML LL SCALE MARK (SYRINGE) ×2 IMPLANT
SYR 5ML LL (SYRINGE) IMPLANT
SYR TB 1ML LUER SLIP (SYRINGE) ×2 IMPLANT
WATER STERILE IRR 500ML POUR (IV SOLUTION) ×2 IMPLANT
WIPE NON LINTING 3.25X3.25 (MISCELLANEOUS) ×2 IMPLANT

## 2014-06-14 NOTE — Anesthesia Preprocedure Evaluation (Signed)
Anesthesia Evaluation  Patient identified by MRN, date of birth, ID band  Reviewed: Allergy & Precautions, H&P , NPO status , Patient's Chart, lab work & pertinent test results  Airway Mallampati: I  TM Distance: >3 FB Neck ROM: full    Dental no notable dental hx.    Pulmonary    Pulmonary exam normal       Cardiovascular hypertension, + dysrhythmias Atrial Fibrillation Rhythm:irregular Rate:Normal     Neuro/Psych    GI/Hepatic   Endo/Other  diabetes, Type 2  Renal/GU      Musculoskeletal   Abdominal   Peds  Hematology   Anesthesia Other Findings   Reproductive/Obstetrics                             Anesthesia Physical Anesthesia Plan  ASA: II  Anesthesia Plan: MAC   Post-op Pain Management:    Induction:   Airway Management Planned:   Additional Equipment:   Intra-op Plan:   Post-operative Plan:   Informed Consent: I have reviewed the patients History and Physical, chart, labs and discussed the procedure including the risks, benefits and alternatives for the proposed anesthesia with the patient or authorized representative who has indicated his/her understanding and acceptance.     Plan Discussed with: CRNA  Anesthesia Plan Comments:         Anesthesia Quick Evaluation

## 2014-06-14 NOTE — H&P (Signed)
  The History and Physical notes were scanned in.  The patient remains stable and unchanged from the H&P.   Previous H&P reviewed, patient examined, and there are no changes.  Christian Berg 06/14/2014 7:29 AM

## 2014-06-14 NOTE — Op Note (Signed)
LOCATION:  [place]   PREOPERATIVE DIAGNOSIS:     Nuclear sclerotic cataract right eye. H25.11   POSTOPERATIVE DIAGNOSIS:     Nuclear sclerotic cataract right eye.     PROCEDURE:  Phacoemusification with posterior chamber intraocular lens placement of the right eye   LENS:   Implant Name Type Inv. Item Serial No. Manufacturer Lot No. LRB No. Used  LENS IMPL INTRAOC [id number] 21.5 - [id number] Intraocular Lens LENS IMPL INTRAOC ZCB00 21.5 [id number] AMO   Right 1        ULTRASOUND TIME: 11 % of 1 minutes, 45 seconds.  CDE 11.3   SURGEON:  Wyonia Hough, MD   ANESTHESIA:  Topical with tetracaine drops and 2% Xylocaine jelly.   COMPLICATIONS:  None.   DESCRIPTION OF PROCEDURE:  The patient was identified in the holding room and transported to the operating room and placed in the supine position under the operating microscope.  The right eye was identified as the operative eye and it was prepped and draped in the usual sterile ophthalmic fashion.   A 1 millimeter clear-corneal paracentesis was made at the 12:00 position.  The anterior chamber was filled with Viscoat viscoelastic.  A 2.4 millimeter keratome was used to make a near-clear corneal incision at the 9:00 position.  A curvilinear capsulorrhexis was made with a cystotome and capsulorrhexis forceps.  Balanced salt solution was used to hydrodissect and hydrodelineate the nucleus.   Phacoemulsification was then used in stop and chop fashion to remove the lens nucleus and epinucleus.  The remaining cortex was then removed using the irrigation and aspiration handpiece. Provisc was then placed into the capsular bag to distend it for lens placement.  A 21.5 -diopter lens was then injected into the capsular bag.  The remaining viscoelastic was aspirated.   Wounds were hydrated with balanced salt solution.  The anterior chamber was inflated to a physiologic pressure with balanced salt solution.  No wound leaks were noted.  Cefuroxime 0.1 ml of a 50m/ml solution was injected into the anterior chamber for a dose of 1 mg of intracameral antibiotic at the completion of the case.   Timolol and Brimonidine drops were applied to the eye.  The patient was taken to the recovery room in stable condition without complications of anesthesia or surgery.   Christian Berg 06/14/2014, 8:03 AM

## 2014-06-14 NOTE — Transfer of Care (Signed)
Immediate Anesthesia Transfer of Care Note  Patient: Christian Berg  Procedure(s) Performed: Procedure(s): CATARACT EXTRACTION PHACO AND INTRAOCULAR LENS PLACEMENT (IOC) (Right)  Patient Location: PACU  Anesthesia Type: MAC  Level of Consciousness: awake, alert  and patient cooperative  Airway and Oxygen Therapy: Patient Spontanous Breathing and Patient connected to supplemental oxygen  Post-op Assessment: Post-op Vital signs reviewed, Patient's Cardiovascular Status Stable, Respiratory Function Stable, Patent Airway and No signs of Nausea or vomiting  Post-op Vital Signs: Reviewed and stable  Complications: No apparent anesthesia complications

## 2014-06-14 NOTE — Anesthesia Postprocedure Evaluation (Signed)
  Anesthesia Post-op Note  Patient: Christian Berg  Procedure(s) Performed: Procedure(s): CATARACT EXTRACTION PHACO AND INTRAOCULAR LENS PLACEMENT (IOC) (Right)  Anesthesia type:MAC  Patient location: PACU  Post pain: Pain level controlled  Post assessment: Post-op Vital signs reviewed, Patient's Cardiovascular Status Stable, Respiratory Function Stable, Patent Airway and No signs of Nausea or vomiting  Post vital signs: Reviewed and stable  Last Vitals:  Filed Vitals:   06/14/14 0804  BP: 166/105  Pulse: 100  Temp: 36.4 C  Resp: 12    Level of consciousness: awake, alert  and patient cooperative  Complications: No apparent anesthesia complications

## 2014-06-15 ENCOUNTER — Encounter: Payer: Self-pay | Admitting: Ophthalmology

## 2014-06-22 DIAGNOSIS — I10 Essential (primary) hypertension: Secondary | ICD-10-CM | POA: Diagnosis not present

## 2014-06-22 DIAGNOSIS — N401 Enlarged prostate with lower urinary tract symptoms: Secondary | ICD-10-CM | POA: Diagnosis not present

## 2014-06-22 DIAGNOSIS — N390 Urinary tract infection, site not specified: Secondary | ICD-10-CM | POA: Diagnosis not present

## 2014-06-22 DIAGNOSIS — R310 Gross hematuria: Secondary | ICD-10-CM | POA: Diagnosis not present

## 2014-06-22 DIAGNOSIS — E669 Obesity, unspecified: Secondary | ICD-10-CM | POA: Diagnosis not present

## 2014-06-24 DIAGNOSIS — I739 Peripheral vascular disease, unspecified: Secondary | ICD-10-CM | POA: Insufficient documentation

## 2014-07-03 ENCOUNTER — Other Ambulatory Visit: Payer: Self-pay

## 2014-07-03 DIAGNOSIS — R319 Hematuria, unspecified: Secondary | ICD-10-CM

## 2014-07-04 ENCOUNTER — Telehealth: Payer: Self-pay | Admitting: Urology

## 2014-07-04 NOTE — Telephone Encounter (Signed)
Pt asked if he needed to get another urine cx once completing this around of abt. His pcp next door gave him this particular abt. Please advise. Cw,lpn

## 2014-07-04 NOTE — Telephone Encounter (Signed)
Pt called wondering about his CT appointment. Best contact # [phone] 07/04/14 maf

## 2014-07-04 NOTE — Telephone Encounter (Signed)
Yes. We will need to see a UA and perform a urine culture 3-5 days after finishing his antibiotic. I assume the notes regarding the recent antibiotic given by his primary care physician are in EPIC.

## 2014-07-05 ENCOUNTER — Other Ambulatory Visit: Payer: Self-pay | Admitting: Family Medicine

## 2014-07-05 ENCOUNTER — Other Ambulatory Visit: Payer: Medicare Other

## 2014-07-05 ENCOUNTER — Ambulatory Visit (INDEPENDENT_AMBULATORY_CARE_PROVIDER_SITE_OTHER): Payer: Medicare Other

## 2014-07-05 DIAGNOSIS — I482 Chronic atrial fibrillation, unspecified: Secondary | ICD-10-CM

## 2014-07-05 DIAGNOSIS — N390 Urinary tract infection, site not specified: Secondary | ICD-10-CM

## 2014-07-05 DIAGNOSIS — E785 Hyperlipidemia, unspecified: Secondary | ICD-10-CM

## 2014-07-05 LAB — URINALYSIS, COMPLETE
Bilirubin, UA: NEGATIVE
Leukocytes, UA: NEGATIVE
Nitrite, UA: NEGATIVE
Protein, UA: NEGATIVE
Specific Gravity, UA: 1.02 (ref 1.005–1.030)
Urobilinogen, Ur: 0.2 mg/dL (ref 0.2–1.0)
pH, UA: 5 (ref 5.0–7.5)

## 2014-07-05 LAB — POCT INR
INR: 1.7
PT: 20.6

## 2014-07-05 LAB — MICROSCOPIC EXAMINATION: Bacteria, UA: NONE SEEN

## 2014-07-05 MED ORDER — WARFARIN SODIUM 10 MG PO TABS: 10.0000 mg | ORAL_TABLET | Freq: Every day | ORAL | Status: DC

## 2014-07-05 MED ORDER — LOVASTATIN 10 MG PO TABS: 20.0000 mg | ORAL_TABLET | Freq: Every day | ORAL | Status: DC

## 2014-07-07 ENCOUNTER — Other Ambulatory Visit: Payer: Self-pay | Admitting: Family Medicine

## 2014-07-07 DIAGNOSIS — E119 Type 2 diabetes mellitus without complications: Secondary | ICD-10-CM

## 2014-07-07 LAB — CULTURE, URINE COMPREHENSIVE

## 2014-07-07 MED ORDER — METFORMIN HCL 850 MG PO TABS: 850.0000 mg | ORAL_TABLET | Freq: Two times a day (BID) | ORAL | Status: DC

## 2014-07-10 ENCOUNTER — Telehealth: Payer: Self-pay

## 2014-07-10 NOTE — Telephone Encounter (Signed)
-----   Message from Nori Riis, PA-C sent at 07/07/2014  4:56 PM EDT ----- Patient has 2K E. Coli, not clinically significant.  He does not need an antibiotic at this time.  He needs a CT Urogram.  Has that been scheduled and does he have a follow up appointment with me?

## 2014-07-10 NOTE — Telephone Encounter (Signed)
Spoke with pt who states he feels ok right now. Made him aware of 2K E. Coli. Pt stated he will have CT URogram completed 07/12/14. While on the phone pt made f/u appt with [facility]. Cw,lpn

## 2014-07-12 ENCOUNTER — Ambulatory Visit
Admission: RE | Admit: 2014-07-12 | Discharge: 2014-07-12 | Disposition: A | Discharge status: Home / Self Care | Payer: Medicare Other | Source: Ambulatory Visit | Attending: Urology | Admitting: Urology

## 2014-07-12 DIAGNOSIS — N200 Calculus of kidney: Secondary | ICD-10-CM | POA: Diagnosis not present

## 2014-07-12 DIAGNOSIS — R319 Hematuria, unspecified: Secondary | ICD-10-CM

## 2014-07-12 DIAGNOSIS — R310 Gross hematuria: Secondary | ICD-10-CM | POA: Diagnosis not present

## 2014-07-12 MED ORDER — IOHEXOL 300 MG/ML  SOLN
125.0000 mL | Freq: Once | INTRAMUSCULAR | Status: AC | PRN
  Administered 2014-07-12: 125 mL via INTRAVENOUS

## 2014-07-17 ENCOUNTER — Ambulatory Visit (INDEPENDENT_AMBULATORY_CARE_PROVIDER_SITE_OTHER): Payer: Medicare Other | Admitting: Family Medicine

## 2014-07-17 ENCOUNTER — Other Ambulatory Visit: Payer: Self-pay

## 2014-07-17 ENCOUNTER — Encounter: Payer: Self-pay | Admitting: Family Medicine

## 2014-07-17 VITALS — BP 128/82 | HR 65 | Temp 98.7°F | Resp 16 | Wt 294.8 lb

## 2014-07-17 DIAGNOSIS — I10 Essential (primary) hypertension: Secondary | ICD-10-CM | POA: Diagnosis not present

## 2014-07-17 DIAGNOSIS — I481 Persistent atrial fibrillation: Secondary | ICD-10-CM | POA: Diagnosis not present

## 2014-07-17 DIAGNOSIS — L565 Disseminated superficial actinic porokeratosis (DSAP): Secondary | ICD-10-CM

## 2014-07-17 DIAGNOSIS — E118 Type 2 diabetes mellitus with unspecified complications: Secondary | ICD-10-CM | POA: Diagnosis not present

## 2014-07-17 DIAGNOSIS — N481 Balanitis: Secondary | ICD-10-CM

## 2014-07-17 DIAGNOSIS — I4819 Other persistent atrial fibrillation: Secondary | ICD-10-CM

## 2014-07-17 LAB — POCT GLYCOSYLATED HEMOGLOBIN (HGB A1C): Hemoglobin A1C: 8.9

## 2014-07-17 MED ORDER — GLIPIZIDE 5 MG PO TABS: 5.0000 mg | ORAL_TABLET | Freq: Two times a day (BID) | ORAL | Status: DC

## 2014-07-17 MED ORDER — NYSTATIN 100000 UNIT/GM EX CREA: 1.0000 "application " | TOPICAL_CREAM | Freq: Two times a day (BID) | CUTANEOUS | Status: DC

## 2014-07-17 NOTE — Patient Instructions (Addendum)
Start glipizide with metformin for diabetes. If you are experiencing nose bleeding take 1/2 your usual coumadin dose the following day. We will call you with lab results

## 2014-07-17 NOTE — Progress Notes (Signed)
Subjective:     Patient ID: Christian Berg, male   DOB: [birth year], 64 y.o.   MRN: [id number]  HPI  Chief Complaint  Patient presents with  . Hypertension    Patient is here today to follow up for hypertension LOV 04/24/14 continue Lisinopril   . Diabetes    Follow up diabetes LOV 04/24/14 changed Metformin HCI 163m BID  . Hyperlipidemia    follow up hypercholesterolemia LOV 04/24/14 continue Lovastatin 147mBID  . Epistaxis    Patient reports two episodes recently of heavy nose bleeding that lasted two hrs.   . Hemorrhoids    External hemorrhoids started bleeding last week around same time nose bleeds began  States he is completing workup of hematuria and has f/u later this week to see urology. Incidental finding of liver cirrhosis was noted on CT scan upon review today.   Review of Systems  Respiratory: Negative for shortness of breath.   Cardiovascular: Negative for chest pain.  Endocrine:       Reports dietary indiscretions and having to hold metformin for two days after his latest CT Scan.       Objective:   Physical Exam  Constitutional: He appears well-developed and well-nourished. No distress.  Cardiovascular:  Irregularly irregular rhythm c/w with hx of a-fib.  Pulmonary/Chest: Breath sounds normal.  Musculoskeletal: He exhibits no edema.       Assessment:     1. Type 2 diabetes mellitus with complication - POCT glycosylated hemoglobin (Hb A1C) - glipiZIDE (GLUCOTROL) 5 MG tablet; Take 1 tablet (5 mg total) by mouth 2 (two) times daily before a meal.  Dispense: 60 tablet; Refill: 3 - Comprehensive metabolic panel - Lipid panel  2. Essential (primary) hypertension - Comprehensive metabolic panel - Lipid panel  3. Persistent atrial fibrillation  - POCT INR  4. 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

## 2014-07-18 DIAGNOSIS — E118 Type 2 diabetes mellitus with unspecified complications: Secondary | ICD-10-CM | POA: Diagnosis not present

## 2014-07-18 DIAGNOSIS — I10 Essential (primary) hypertension: Secondary | ICD-10-CM | POA: Diagnosis not present

## 2014-07-19 ENCOUNTER — Telehealth: Payer: Self-pay

## 2014-07-19 ENCOUNTER — Ambulatory Visit: Payer: Self-pay | Admitting: Family Medicine

## 2014-07-19 ENCOUNTER — Ambulatory Visit: Payer: Medicare Other

## 2014-07-19 LAB — LIPID PANEL
Chol/HDL Ratio: 4 ratio units (ref 0.0–5.0)
Cholesterol, Total: 132 mg/dL (ref 100–199)
HDL: 33 mg/dL — ABNORMAL LOW (ref >39)
LDL Calculated: 58 mg/dL (ref 0–99)
Triglycerides: 206 mg/dL — ABNORMAL HIGH (ref 0–149)
VLDL Cholesterol Cal: 41 mg/dL — ABNORMAL HIGH (ref 5–40)

## 2014-07-19 LAB — COMPREHENSIVE METABOLIC PANEL
ALT: 30 IU/L (ref 0–44)
AST: 39 IU/L (ref 0–40)
Albumin/Globulin Ratio: 1.2 (ref 1.1–2.5)
Albumin: 3.5 g/dL — ABNORMAL LOW (ref 3.6–4.8)
Alkaline Phosphatase: 146 IU/L — ABNORMAL HIGH (ref 39–117)
BUN/Creatinine Ratio: 8 — ABNORMAL LOW (ref 10–22)
BUN: 7 mg/dL — ABNORMAL LOW (ref 8–27)
Bilirubin Total: 1 mg/dL (ref 0.0–1.2)
CO2: 22 mmol/L (ref 18–29)
Calcium: 9.4 mg/dL (ref 8.6–10.2)
Chloride: 99 mmol/L (ref 97–108)
Creatinine, Ser: 0.84 mg/dL (ref 0.76–1.27)
GFR calc Af Amer: 106 mL/min/{1.73_m2} (ref >59)
GFR calc non Af Amer: 92 mL/min/{1.73_m2} (ref >59)
Globulin, Total: 2.9 g/dL (ref 1.5–4.5)
Glucose: 312 mg/dL — ABNORMAL HIGH (ref 65–99)
Potassium: 4.6 mmol/L (ref 3.5–5.2)
Sodium: 139 mmol/L (ref 134–144)
Total Protein: 6.4 g/dL (ref 6.0–8.5)

## 2014-07-19 NOTE — Telephone Encounter (Signed)
-----   Message from [name], [place] sent at 07/19/2014  7:50 AM EDT ----- Cholesterol ok on current medication. Kidney function good. Sugar high as we discussed in the office.

## 2014-07-19 NOTE — Telephone Encounter (Signed)
Advised pt of lab results. Pt verbally acknowledges understanding. Christian Berg, CMA   

## 2014-07-21 ENCOUNTER — Ambulatory Visit (INDEPENDENT_AMBULATORY_CARE_PROVIDER_SITE_OTHER): Payer: Medicare Other | Admitting: Urology

## 2014-07-21 ENCOUNTER — Encounter: Payer: Self-pay | Admitting: Urology

## 2014-07-21 VITALS — BP 146/86 | HR 120 | Ht 68.0 in | Wt 297.3 lb

## 2014-07-21 DIAGNOSIS — N200 Calculus of kidney: Secondary | ICD-10-CM | POA: Diagnosis not present

## 2014-07-21 DIAGNOSIS — K7469 Other cirrhosis of liver: Secondary | ICD-10-CM

## 2014-07-21 DIAGNOSIS — R310 Gross hematuria: Secondary | ICD-10-CM

## 2014-07-21 LAB — URINALYSIS, COMPLETE
Bilirubin, UA: NEGATIVE
Leukocytes, UA: NEGATIVE
Nitrite, UA: NEGATIVE
Protein, UA: NEGATIVE
Specific Gravity, UA: 1.02 (ref 1.005–1.030)
Urobilinogen, Ur: 0.2 mg/dL (ref 0.2–1.0)
pH, UA: 5 (ref 5.0–7.5)

## 2014-07-21 LAB — MICROSCOPIC EXAMINATION: Bacteria, UA: NONE SEEN

## 2014-07-23 NOTE — Progress Notes (Signed)
07/21/2014 11:58 PM   Christian Berg [birth year] [id number]  Referring provider: Carmon Berg, [address] [place], [place] [address]  Chief Complaint  Patient presents with  . Follow-up    CT results    HPI: Christian Berg is a 64 year old white male who presented to [place] with recurrent urinary tract infections and gross hematuria.These infections and gross hematuria have been occurring since February 2016 and he was scheduled for an appointment with [place] in March, but he did not keep that appointment.   His current urinary symptoms are frequency, urgency, dysuria, incontinence, nocturia and weak urinary stream. These have been occurring since February 2016.   IPPS 8/3. PVR is 0 mL. His UA today notes >30RBCs/hpf.  His PSA is 1.3 ng/mL on 06/22/2014.    His symptoms during an UTI's are gross hematuria, low grade fevers, dysuria and malaise. The antibiotics do relieve the symptoms, but they return with a few weeks of stopping the antibiotic.   He does have a past history of stones.   He is also suffering from ED.   He underwent CT urogram on 07/12/2014. He presents today with his wife to discuss results.  The films are reviewed with patient and his wife. He was found to have a single nonobstructing left renal calculus. The left ureter was not completely opacified in the study. He was also found to have liver cirrhosis with evidence of portal hypertension with no ascites.   PMH: Past Medical History  Diagnosis Date  . Hypertension   . Presence of permanent cardiac pacemaker     Inactive  . CHF (congestive heart failure)   . Peripheral vascular disease     Carotid stenosis  . TIA (transient ischemic attack) 2011    No deficits  . Hypercholesteremia   . Kidney stones   . Degenerative disc disease, lumbar     s/p injury  . Diabetes mellitus without complication   . Disseminated superficial actinic porokeratosis   . Dysrhythmia     A-FIB,  palpatations    Surgical History: Past Surgical History  Procedure Laterality Date  . Appendectomy  2004  . Cardiac catheterization    . Cardiac pacemaker placement  2005  . Cataract extraction w/phaco Right 06/14/2014    Procedure: CATARACT EXTRACTION PHACO AND INTRAOCULAR LENS PLACEMENT (IOC);  Surgeon: Leandrew Koyanagi, MD;  Location: [facility];  Service: Ophthalmology;  Laterality: Right;    Home Medications:    Medication List       This list is accurate as of: 07/21/14 11:59 PM.  Always use your most recent med list.               aspirin 81 MG chewable tablet  Chew 1 tablet by mouth daily.     fluorouracil 5 % cream  Commonly known as:  EFUDEX  1 application 2 (two) times daily.     glipiZIDE 5 MG tablet  Commonly known as:  GLUCOTROL  Take 1 tablet (5 mg total) by mouth 2 (two) times daily before a meal.     ibuprofen 800 MG tablet  Commonly known as:  ADVIL,MOTRIN  Take 850 mg by mouth every 8 (eight) hours as needed.     lisinopril 20 MG tablet  Commonly known as:  PRINIVIL,ZESTRIL  Take 1 tablet by mouth daily.     lovastatin 10 MG tablet  Commonly known as:  MEVACOR  Take 2 tablets (20 mg total) by mouth daily.  metFORMIN 850 MG tablet  Commonly known as:  GLUCOPHAGE  Take 1 tablet (850 mg total) by mouth 2 (two) times daily with a meal.     metoprolol 100 MG tablet  Commonly known as:  LOPRESSOR  Take 1 tablet by mouth 2 (two) times daily. AM     nystatin cream  Commonly known as:  MYCOSTATIN  Apply 1 application topically 2 (two) times daily.     traMADol-acetaminophen 37.5-325 MG per tablet  Commonly known as:  ULTRACET  Take 1 tablet by mouth as needed.     warfarin 10 MG tablet  Commonly known as:  COUMADIN  Take 1 tablet (10 mg total) by mouth daily. New dose        Allergies:  Allergies  Allergen Reactions  . Sulfa Antibiotics Hives    Other reaction(s): Skin Rashes    Family History: Family History    Problem Relation Age of Onset  . Adopted: Yes  . Heart disease Mother   . Fibromyalgia Sister   . Hypertension Daughter   . Migraines Daughter   . Kidney disease Neg Hx   . Prostate cancer Neg Hx   . Bladder Cancer Neg Hx     Social History:  reports that he has never smoked. He does not have any smokeless tobacco history on file. He reports that he does not drink alcohol or use illicit drugs.  ROS: Urological Symptom Review  Patient is experiencing the following symptoms: Hard to postpone urination Get up at night to urinate Leakage of urine Erection problems (male only)   Review of Systems  Gastrointestinal (upper)  : Negative for upper GI symptoms  Gastrointestinal (lower) : Negative for lower GI symptoms  Constitutional : Negative for symptoms  Skin: Negative for skin symptoms  Eyes: Negative for eye symptoms  Ear/Nose/Throat : Negative for Ear/Nose/Throat symptoms  Hematologic/Lymphatic: Negative for Hematologic/Lymphatic symptoms  Cardiovascular : Negative for cardiovascular symptoms  Respiratory : Negative for respiratory symptoms  Endocrine: Negative for endocrine symptoms  Musculoskeletal: Negative for musculoskeletal symptoms  Neurological: Negative for neurological symptoms  Psychologic: Negative for psychiatric symptoms   Physical Exam: BP 146/86 mmHg  Pulse 120  Ht 5' 8"  (1.727 m)  Wt 297 lb 4.8 oz (134.854 kg)  BMI 45.21 kg/m2   Laboratory Data: Results for orders placed or performed in visit on 07/21/14  CULTURE, URINE COMPREHENSIVE  Result Value Ref Range   Result 1 Streptococcus species (A)    Result 2 Gram negative rods (A)   Microscopic Examination  Result Value Ref Range   WBC, UA 0-5 0 -  5 /hpf   RBC, UA 0-2 0 -  2 /hpf   Epithelial Cells (non renal) 0-10 0 - 10 /hpf   Bacteria, UA None seen None seen/Few  Urinalysis, Complete  Result Value Ref Range   Specific Gravity, UA 1.020 1.005 - 1.030   pH, UA 5.0  5.0 - 7.5   Color, UA Yellow Yellow   Appearance Ur Clear Clear   Leukocytes, UA Negative Negative   Protein, UA Negative Negative/Trace   Glucose, UA 3+ (A) Negative   Ketones, UA Trace (A) Negative   RBC, UA Trace (A) Negative   Bilirubin, UA Negative Negative   Urobilinogen, Ur 0.2 0.2 - 1.0 mg/dL   Nitrite, UA Negative Negative   Microscopic Examination See below:    Lab Results  Component Value Date   WBC 5.8 09/16/2013   HGB 12.8* 09/16/2013   HCT 36.8* 09/16/2013  MCV 102* 09/16/2013   PLT 107* 09/17/2013    Lab Results  Component Value Date   CREATININE 0.84 07/18/2014    No results found for: PSA  No results found for: TESTOSTERONE  Lab Results  Component Value Date   HGBA1C 8.9% 07/17/2014    Urinalysis    Component Value Date/Time   GLUCOSEU 3+* 07/21/2014 1239   BILIRUBINUR Negative 07/21/2014 1239   NITRITE Negative 07/21/2014 1239   LEUKOCYTESUR Negative 07/21/2014 1239    Pertinent Imaging:  CLINICAL DATA: Hematuria, chronic renal infections and gross hematuria.  EXAM: CT ABDOMEN AND PELVIS WITHOUT AND WITH CONTRAST  TECHNIQUE: Multidetector CT imaging of the abdomen and pelvis was performed following the standard protocol before and following the bolus administration of intravenous contrast.  CONTRAST: 148m OMNIPAQUE IOHEXOL 300 MG/ML SOLN  COMPARISON: CT 09/14/2013  FINDINGS: Lower chest: Lung bases are clear.  Hepatobiliary: Liver has a nodular contour. The caudate lobe is enlarged. There is recanalization of the umbilical vein. Venous collaterals in the pelvis.  Pancreas: Pancreas is normal. No ductal dilatation. No pancreatic inflammation.  Spleen: Normal spleen  Adrenals/urinary tract: Adrenal glands are normal.  Single nonobstructing 2 mm calculus in lower pole of the left kidney. No ureterolithiasis or obstructive uropathy. Bladder calculi  Stomach/Bowel: Stomach, small bowel, appendix, and cecum  are normal. The colon and rectosigmoid colon are normal.  Vascular/Lymphatic: Abdominal aorta is normal caliber. There is no retroperitoneal or periportal lymphadenopathy. No pelvic lymphadenopathy.  Reproductive: Post hysterectomy anatomy.  Musculoskeletal: No aggressive osseous lesion.  Other: No ascites.  IMPRESSION: 1. Single nonobstructing left renal calculus. 2. No explanation for hematuria. No nephrolithiasis, ureterolithiasis, enhancing renal cortical lesion, or filling defects within the collecting systems. 3. No bladder stones or filling defects in the bladder which does not excluded a bladder lesion. 4. 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 No ascites.   Electronically Signed  By: SSuzy BouchardM.D.  On: 07/12/2014 11:54     Assessment & Plan:    1. Gross hematuria:   Patient has completed the first part of his hematuria workup with a CT urogram completed on 07/12/2014. He was found to have a single nonobstructing left renal calculus. His left ureter was not completely opacified during the study.  He will be scheduled for cystoscopic the with bilateral retrogrades with possible biopsy and possible ureteral stent placement.  I described to the patient how the procedure is performed and the risks associated with the procedure, such as: Infection, bleeding, uncomfortableness with the first few days after the procedure, the possibility of a biopsy of an area of concern in the bladder or the ureters.  I explained to the patient if a ureteral stent does happen to be placed, he may experience urinary frequency, blood in the urine, pain in the flank when passing urine, stent displacement and/or infection.  I also explained the risks of general anesthesia, such as: MI, CVA, paralysis, coma and/or death.  Patient is on anticoagulants (ASA and [place]), so we will obtain cardiac clearance.     - Urinalysis, Complete - CULTURE, URINE COMPREHENSIVE  2.  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     No Follow-up on file.  [facility] [address] [place] [phone]

## 2014-07-24 ENCOUNTER — Ambulatory Visit: Payer: Self-pay | Admitting: Family Medicine

## 2014-07-24 DIAGNOSIS — R310 Gross hematuria: Secondary | ICD-10-CM | POA: Insufficient documentation

## 2014-07-24 DIAGNOSIS — K7469 Other cirrhosis of liver: Secondary | ICD-10-CM | POA: Insufficient documentation

## 2014-07-24 DIAGNOSIS — N200 Calculus of kidney: Secondary | ICD-10-CM | POA: Insufficient documentation

## 2014-07-24 LAB — CULTURE, URINE COMPREHENSIVE

## 2014-07-24 NOTE — Telephone Encounter (Signed)
Spoke with Mardene Celeste at Dr[facility]hem[facility] Massed and asked for a message to be sent to his nurse in regards to patient stopping his ASA and coumadin prior to surgery on 08-09-14. Clearance form was also faxed awaiting their response/SW

## 2014-07-25 ENCOUNTER — Telehealth: Payer: Self-pay

## 2014-07-25 DIAGNOSIS — N390 Urinary tract infection, site not specified: Secondary | ICD-10-CM

## 2014-07-25 MED ORDER — NITROFURANTOIN MONOHYD MACRO 100 MG PO CAPS: 100.0000 mg | ORAL_CAPSULE | Freq: Two times a day (BID) | ORAL | Status: DC

## 2014-07-25 NOTE — Telephone Encounter (Signed)
-----   Message from Nori Riis, PA-C sent at 07/24/2014  7:53 PM EDT ----- Patient needs to start Macrobid 100 mg one capsule twice daily for 30 days.  I want him to be on it for his surgery.

## 2014-07-25 NOTE — Telephone Encounter (Signed)
Spoke with pt and made aware of needing abt. Pt voiced understanding. Medication was called into pharmacy. Cwl,pn

## 2014-07-27 DIAGNOSIS — E782 Mixed hyperlipidemia: Secondary | ICD-10-CM | POA: Diagnosis not present

## 2014-07-27 DIAGNOSIS — I5032 Chronic diastolic (congestive) heart failure: Secondary | ICD-10-CM | POA: Diagnosis not present

## 2014-07-27 DIAGNOSIS — I482 Chronic atrial fibrillation: Secondary | ICD-10-CM | POA: Diagnosis not present

## 2014-07-27 DIAGNOSIS — I10 Essential (primary) hypertension: Secondary | ICD-10-CM | POA: Diagnosis not present

## 2014-08-01 ENCOUNTER — Other Ambulatory Visit: Payer: Self-pay

## 2014-08-01 ENCOUNTER — Telehealth: Payer: Self-pay | Admitting: Urology

## 2014-08-01 NOTE — Telephone Encounter (Signed)
[name] from pre-op at [facility] called stating that Christian Berg was a no show  08/01/14 MAF

## 2014-08-02 NOTE — Telephone Encounter (Signed)
I spoke w/ the pt and notified him that he missed his appt. For [place]. I gave the pt the phone number to pre-admit for him to reschedule his appt. He called back and informed me that his rescheduled appt is 08/07/14.

## 2014-08-03 ENCOUNTER — Encounter: Payer: Self-pay | Admitting: Family Medicine

## 2014-08-03 ENCOUNTER — Ambulatory Visit (INDEPENDENT_AMBULATORY_CARE_PROVIDER_SITE_OTHER): Payer: Medicare Other | Admitting: Family Medicine

## 2014-08-03 ENCOUNTER — Ambulatory Visit: Payer: Medicare Other | Admitting: Family Medicine

## 2014-08-03 VITALS — BP 128/84 | HR 103 | Temp 99.1°F | Resp 18 | Wt 302.6 lb

## 2014-08-03 DIAGNOSIS — R509 Fever, unspecified: Secondary | ICD-10-CM | POA: Diagnosis not present

## 2014-08-03 DIAGNOSIS — R52 Pain, unspecified: Secondary | ICD-10-CM | POA: Diagnosis not present

## 2014-08-03 LAB — POCT URINALYSIS DIPSTICK
Glucose, UA: 5000
Leukocytes, UA: NEGATIVE
Nitrite, UA: NEGATIVE
Spec Grav, UA: 1.02
Urobilinogen, UA: 4
pH, UA: 5

## 2014-08-03 MED ORDER — DOXYCYCLINE HYCLATE 100 MG PO TABS
100.0000 mg | ORAL_TABLET | Freq: Two times a day (BID) | ORAL | Status: DC
Start: 2014-08-03 — End: 2014-09-05

## 2014-08-03 NOTE — Patient Instructions (Signed)
Check protime at clinic next week. If worsening with persistent high fevers or increased confusion go to the ER.

## 2014-08-03 NOTE — Progress Notes (Signed)
Subjective:     Patient ID: Christian Berg, male   DOB: 07-14-1950, 64 y.o.   MRN: [id number]  HPI  Chief Complaint  Patient presents with  . Fever    Patient comes in office today with concerns of fever ince Tuesday, patient states that symptoms began in morning with feer and chills and subsided with use of medication. Wednesday symptoms returned and patient has had a high of 100.9. Today he complains of body ache, fever up to 100.9 he denies cough, runny nose, ear pain or sinus pain. Patient does report the other day he felt off and had fell and bruised his left arm  Also reports headache, urinary urgency and hallucinations at onset of sx. Does have possibility of tick exposure as he works in his yard and [employer]. Accompanied by his wife today   Review of Systems  Genitourinary:       Undergoing urology evaluation for hematuria       Objective:   Physical Exam  Constitutional: He appears well-developed and well-nourished. No distress.  Pulmonary/Chest: Breath sounds normal.       Assessment:    1. Fever, unspecified fever cause - POCT urinalysis dipstick - doxycycline (VIBRA-TABS) 100 MG tablet; Take 1 tablet (100 mg total) by mouth 2 (two) times daily.  Dispense: 20 tablet; Refill: 0  2. Body aches - doxycycline (VIBRA-TABS) 100 MG tablet; Take 1 tablet (100 mg total) by mouth 2 (two) times daily.  Dispense: 20 tablet; Refill: 0    Plan:   Will treat presumptively for tick fever.

## 2014-08-04 ENCOUNTER — Telehealth: Payer: Self-pay | Admitting: Family Medicine

## 2014-08-04 NOTE — Telephone Encounter (Signed)
May use Tylenol for discomfort but no more than 3000 mg.daily. If fever does not break in the next 24 hours or you are feeling worse/hallucinating report to the ER.

## 2014-08-04 NOTE — Telephone Encounter (Signed)
Pt called havin chills from th etick fever and fever 101  Wants to know if you think he needs to go to the ER.  Please call pt back.  Thanks, TP

## 2014-08-04 NOTE — Telephone Encounter (Signed)
Patient advised as below.  

## 2014-08-04 NOTE — Telephone Encounter (Signed)
Since this is common with tick fever would you advise just taking OTC tylenol?

## 2014-08-07 ENCOUNTER — Other Ambulatory Visit: Payer: Self-pay

## 2014-08-07 ENCOUNTER — Inpatient Hospital Stay: Admission: RE | Admit: 2014-08-07 | Payer: Self-pay | Source: Ambulatory Visit

## 2014-08-07 ENCOUNTER — Inpatient Hospital Stay
Admission: EM | Admit: 2014-08-07 | Discharge: 2014-08-09 | DRG: 871 | Disposition: A | Discharge status: Home / Self Care | Payer: Medicare Other | Attending: Internal Medicine | Admitting: Internal Medicine

## 2014-08-07 ENCOUNTER — Emergency Department: Payer: Medicare Other

## 2014-08-07 ENCOUNTER — Encounter: Payer: Self-pay | Admitting: Emergency Medicine

## 2014-08-07 DIAGNOSIS — Z8249 Family history of ischemic heart disease and other diseases of the circulatory system: Secondary | ICD-10-CM

## 2014-08-07 DIAGNOSIS — R6510 Systemic inflammatory response syndrome (SIRS) of non-infectious origin without acute organ dysfunction: Secondary | ICD-10-CM

## 2014-08-07 DIAGNOSIS — Z9049 Acquired absence of other specified parts of digestive tract: Secondary | ICD-10-CM | POA: Diagnosis present

## 2014-08-07 DIAGNOSIS — Z9841 Cataract extraction status, right eye: Secondary | ICD-10-CM | POA: Diagnosis not present

## 2014-08-07 DIAGNOSIS — I151 Hypertension secondary to other renal disorders: Secondary | ICD-10-CM | POA: Diagnosis not present

## 2014-08-07 DIAGNOSIS — I739 Peripheral vascular disease, unspecified: Secondary | ICD-10-CM | POA: Diagnosis present

## 2014-08-07 DIAGNOSIS — G8929 Other chronic pain: Secondary | ICD-10-CM | POA: Diagnosis present

## 2014-08-07 DIAGNOSIS — I10 Essential (primary) hypertension: Secondary | ICD-10-CM | POA: Diagnosis not present

## 2014-08-07 DIAGNOSIS — Z87442 Personal history of urinary calculi: Secondary | ICD-10-CM

## 2014-08-07 DIAGNOSIS — E785 Hyperlipidemia, unspecified: Secondary | ICD-10-CM | POA: Diagnosis present

## 2014-08-07 DIAGNOSIS — Z79899 Other long term (current) drug therapy: Secondary | ICD-10-CM

## 2014-08-07 DIAGNOSIS — I509 Heart failure, unspecified: Secondary | ICD-10-CM | POA: Diagnosis present

## 2014-08-07 DIAGNOSIS — Z7901 Long term (current) use of anticoagulants: Secondary | ICD-10-CM

## 2014-08-07 DIAGNOSIS — E780 Pure hypercholesterolemia: Secondary | ICD-10-CM | POA: Diagnosis present

## 2014-08-07 DIAGNOSIS — A419 Sepsis, unspecified organism: Secondary | ICD-10-CM | POA: Diagnosis present

## 2014-08-07 DIAGNOSIS — Z7982 Long term (current) use of aspirin: Secondary | ICD-10-CM | POA: Diagnosis not present

## 2014-08-07 DIAGNOSIS — I4891 Unspecified atrial fibrillation: Secondary | ICD-10-CM | POA: Diagnosis not present

## 2014-08-07 DIAGNOSIS — G9340 Encephalopathy, unspecified: Secondary | ICD-10-CM | POA: Diagnosis present

## 2014-08-07 DIAGNOSIS — Z950 Presence of cardiac pacemaker: Secondary | ICD-10-CM

## 2014-08-07 DIAGNOSIS — R197 Diarrhea, unspecified: Secondary | ICD-10-CM | POA: Diagnosis present

## 2014-08-07 DIAGNOSIS — K766 Portal hypertension: Secondary | ICD-10-CM | POA: Diagnosis present

## 2014-08-07 DIAGNOSIS — E119 Type 2 diabetes mellitus without complications: Secondary | ICD-10-CM | POA: Diagnosis not present

## 2014-08-07 DIAGNOSIS — K7460 Unspecified cirrhosis of liver: Secondary | ICD-10-CM | POA: Diagnosis present

## 2014-08-07 DIAGNOSIS — I482 Chronic atrial fibrillation: Secondary | ICD-10-CM | POA: Diagnosis present

## 2014-08-07 DIAGNOSIS — R310 Gross hematuria: Secondary | ICD-10-CM | POA: Diagnosis not present

## 2014-08-07 DIAGNOSIS — N159 Renal tubulo-interstitial disease, unspecified: Secondary | ICD-10-CM | POA: Diagnosis present

## 2014-08-07 DIAGNOSIS — M5136 Other intervertebral disc degeneration, lumbar region: Secondary | ICD-10-CM | POA: Diagnosis present

## 2014-08-07 DIAGNOSIS — A938 Other specified arthropod-borne viral fevers: Secondary | ICD-10-CM | POA: Diagnosis not present

## 2014-08-07 DIAGNOSIS — N200 Calculus of kidney: Secondary | ICD-10-CM | POA: Diagnosis present

## 2014-08-07 DIAGNOSIS — Z882 Allergy status to sulfonamides status: Secondary | ICD-10-CM

## 2014-08-07 DIAGNOSIS — N390 Urinary tract infection, site not specified: Secondary | ICD-10-CM | POA: Diagnosis present

## 2014-08-07 DIAGNOSIS — E118 Type 2 diabetes mellitus with unspecified complications: Secondary | ICD-10-CM | POA: Diagnosis present

## 2014-08-07 DIAGNOSIS — R000 Tachycardia, unspecified: Secondary | ICD-10-CM | POA: Diagnosis not present

## 2014-08-07 DIAGNOSIS — Z8673 Personal history of transient ischemic attack (TIA), and cerebral infarction without residual deficits: Secondary | ICD-10-CM | POA: Diagnosis not present

## 2014-08-07 DIAGNOSIS — R6883 Chills (without fever): Secondary | ICD-10-CM | POA: Diagnosis not present

## 2014-08-07 DIAGNOSIS — E872 Acidosis: Secondary | ICD-10-CM | POA: Diagnosis present

## 2014-08-07 DIAGNOSIS — R509 Fever, unspecified: Secondary | ICD-10-CM | POA: Diagnosis not present

## 2014-08-07 LAB — CBC
HCT: 41.8 % (ref 40.0–52.0)
Hemoglobin: 14.1 g/dL (ref 13.0–18.0)
MCH: 33.5 pg (ref 26.0–34.0)
MCHC: 33.9 g/dL (ref 32.0–36.0)
MCV: 99 fL (ref 80.0–100.0)
Platelets: 143 10*3/uL — ABNORMAL LOW (ref 150–440)
RBC: 4.22 MIL/uL — ABNORMAL LOW (ref 4.40–5.90)
RDW: 13.6 % (ref 11.5–14.5)
WBC: 6.1 10*3/uL (ref 3.8–10.6)

## 2014-08-07 LAB — BASIC METABOLIC PANEL
Anion gap: 10 (ref 5–15)
BUN: 10 mg/dL (ref 6–20)
CO2: 23 mmol/L (ref 22–32)
Calcium: 9.5 mg/dL (ref 8.9–10.3)
Chloride: 104 mmol/L (ref 101–111)
Creatinine, Ser: 0.91 mg/dL (ref 0.61–1.24)
GFR calc Af Amer: >60 mL/min (ref >60)
GFR calc non Af Amer: >60 mL/min (ref >60)
Glucose, Bld: 247 mg/dL — ABNORMAL HIGH (ref 65–99)
Potassium: 3.7 mmol/L (ref 3.5–5.1)
Sodium: 137 mmol/L (ref 135–145)

## 2014-08-07 LAB — URINALYSIS COMPLETE WITH MICROSCOPIC (ARMC ONLY)
Bacteria, UA: NONE SEEN
Bilirubin Urine: NEGATIVE
Glucose, UA: >500 mg/dL — AB
Ketones, ur: NEGATIVE mg/dL
Nitrite: NEGATIVE
Protein, ur: NEGATIVE mg/dL
Specific Gravity, Urine: 1.013 (ref 1.005–1.030)
Squamous Epithelial / LPF: NONE SEEN
pH: 5 (ref 5.0–8.0)

## 2014-08-07 LAB — LACTIC ACID, PLASMA
Lactic Acid, Venous: 4 mmol/L (ref 0.5–2.0)
Lactic Acid, Venous: 2.4 mmol/L (ref 0.5–2.0)

## 2014-08-07 LAB — APTT: aPTT: 55 seconds — ABNORMAL HIGH (ref 24–36)

## 2014-08-07 LAB — GLUCOSE, CAPILLARY: Glucose-Capillary: 171 mg/dL — ABNORMAL HIGH (ref 65–99)

## 2014-08-07 LAB — PROTIME-INR
INR: 3.04
Prothrombin Time: 31.5 seconds — ABNORMAL HIGH (ref 11.4–15.0)

## 2014-08-07 MED ORDER — GLIPIZIDE 5 MG PO TABS
5.0000 mg | ORAL_TABLET | Freq: Two times a day (BID) | ORAL | Status: DC
  Administered 2014-08-08: 5 mg via ORAL
  Filled 2014-08-07 (×2): qty 1

## 2014-08-07 MED ORDER — VANCOMYCIN HCL IN DEXTROSE 1-5 GM/200ML-% IV SOLN
1000.0000 mg | Freq: Once | INTRAVENOUS | Status: AC
  Administered 2014-08-07: 1000 mg via INTRAVENOUS
  Filled 2014-08-07: qty 200

## 2014-08-07 MED ORDER — WARFARIN - PHYSICIAN DOSING INPATIENT: Freq: Every day | Status: DC

## 2014-08-07 MED ORDER — VANCOMYCIN HCL IN DEXTROSE 1-5 GM/200ML-% IV SOLN: 1000.0000 mg | INTRAVENOUS | Status: DC

## 2014-08-07 MED ORDER — TRAMADOL-ACETAMINOPHEN 37.5-325 MG PO TABS
1.0000 | ORAL_TABLET | Freq: Four times a day (QID) | ORAL | Status: DC | PRN
  Administered 2014-08-07 – 2014-08-08 (×3): 1 via ORAL
  Filled 2014-08-07 (×3): qty 1

## 2014-08-07 MED ORDER — SODIUM CHLORIDE 0.9 % IV BOLUS (SEPSIS)
1000.0000 mL | Freq: Once | INTRAVENOUS | Status: AC
  Administered 2014-08-07: 1000 mL via INTRAVENOUS

## 2014-08-07 MED ORDER — VANCOMYCIN HCL IN DEXTROSE 1-5 GM/200ML-% IV SOLN
1000.0000 mg | Freq: Three times a day (TID) | INTRAVENOUS | Status: DC
  Administered 2014-08-08 – 2014-08-09 (×4): 1000 mg via INTRAVENOUS
  Filled 2014-08-07 (×7): qty 200

## 2014-08-07 MED ORDER — WARFARIN SODIUM 10 MG PO TABS
10.0000 mg | ORAL_TABLET | Freq: Every day | ORAL | Status: DC
  Administered 2014-08-08: 10 mg via ORAL
  Filled 2014-08-07 (×2): qty 1

## 2014-08-07 MED ORDER — SODIUM CHLORIDE 0.9 % IV BOLUS (SEPSIS)
3050.0000 mL | Freq: Once | INTRAVENOUS | Status: AC
  Administered 2014-08-07: 3050 mL via INTRAVENOUS

## 2014-08-07 MED ORDER — SODIUM CHLORIDE 0.9 % IV SOLN: INTRAVENOUS | Status: DC

## 2014-08-07 MED ORDER — SODIUM CHLORIDE 0.9 % IV SOLN: 75.0000 mg/kg | Freq: Three times a day (TID) | INTRAVENOUS | Status: DC

## 2014-08-07 MED ORDER — PIPERACILLIN-TAZOBACTAM 3.375 G IVPB
3.3750 g | Freq: Once | INTRAVENOUS | Status: AC
  Administered 2014-08-07: 3.375 g via INTRAVENOUS
  Filled 2014-08-07: qty 50

## 2014-08-07 MED ORDER — METOPROLOL TARTRATE 100 MG PO TABS
100.0000 mg | ORAL_TABLET | Freq: Two times a day (BID) | ORAL | Status: DC
  Administered 2014-08-07 – 2014-08-09 (×4): 100 mg via ORAL
  Filled 2014-08-07 (×4): qty 1

## 2014-08-07 MED ORDER — DOXYCYCLINE HYCLATE 100 MG PO TABS
100.0000 mg | ORAL_TABLET | Freq: Two times a day (BID) | ORAL | Status: DC
  Administered 2014-08-07 – 2014-08-08 (×3): 100 mg via ORAL
  Filled 2014-08-07 (×3): qty 1

## 2014-08-07 MED ORDER — METFORMIN HCL 850 MG PO TABS
850.0000 mg | ORAL_TABLET | Freq: Two times a day (BID) | ORAL | Status: DC
  Administered 2014-08-08: 850 mg via ORAL
  Filled 2014-08-07 (×5): qty 1

## 2014-08-07 MED ORDER — VANCOMYCIN HCL 10 G IV SOLR: 1250.0000 mg | Freq: Two times a day (BID) | INTRAVENOUS | Status: DC

## 2014-08-07 MED ORDER — PIPERACILLIN-TAZOBACTAM 3.375 G IVPB
3.3750 g | Freq: Three times a day (TID) | INTRAVENOUS | Status: DC
  Administered 2014-08-07 – 2014-08-09 (×4): 3.375 g via INTRAVENOUS
  Filled 2014-08-07 (×11): qty 50

## 2014-08-07 MED ORDER — SODIUM CHLORIDE 0.9 % IV SOLN: 1250.0000 mg | Freq: Once | INTRAVENOUS | Status: DC

## 2014-08-07 MED ORDER — PRAVASTATIN SODIUM 10 MG PO TABS
10.0000 mg | ORAL_TABLET | Freq: Every day | ORAL | Status: DC
  Administered 2014-08-08: 10 mg via ORAL
  Filled 2014-08-07: qty 1

## 2014-08-07 NOTE — H&P (Signed)
[facility] NAME: Christian Berg    MR#:  [id number]  DATE OF BIRTH:  [birth year]  DATE OF ADMISSION:  08/07/2014  PRIMARY CARE PHYSICIAN: [name], PA   REQUESTING/REFERRING PHYSICIAN:DR.Herbie Baltimore  CHIEF COMPLAINT fatigue ,chills    Chief Complaint  Patient presents with  . Fatigue    HISTORY OF PRESENT ILLNESS:  Christian Berg  is a 64 y.o. male with a known history of hypertension, diabetes, chronic A. fib comes in because of fever chills and fatigue. Patient is having fever since last Tuesday. Patient was given doxycycline for possible tick bite. Patient says that her fever is coming down but still has significant fatigue. Patient has seen by him to urology because of hematuria patient had a CT urogram on June 15 and found to have a single nonobstructing left renal calculus. Patient supposed to have cystoscopy with possible biopsy this week. However is here because of her continued chills fatigue. Patient also found to have liver cirrhosis with evidence of portal hypertension associated admission diagnoses. Patient denies any nausea or vomiting. Does have diarrhea going on for about 3 days. Does have some abdominal cramps. And has poor appetite. Urine cultures from March 24 showed Enterobacter UTI. Patient was given Macrobid. . Now he is taking Macrobid for preparation for cystoscopy and bilateral retrograde and possible biopsy and he is on doxycycline for possible tick bite. Does have some dysuria and hematuria. Denies any flank pain.  PAST MEDICAL HISTORY:   Past Medical History  Diagnosis Date  . Hypertension   . Presence of permanent cardiac pacemaker     Inactive  . CHF (congestive heart failure)   . Peripheral vascular disease     Carotid stenosis  . TIA (transient ischemic attack) 2011    No deficits  . Hypercholesteremia   . Kidney stones   . Degenerative disc disease, lumbar     s/p injury  . Diabetes  mellitus without complication   . Disseminated superficial actinic porokeratosis   . Dysrhythmia     A-FIB, palpatations    PAST SURGICAL HISTOIRY:   Past Surgical History  Procedure Laterality Date  . Appendectomy  2004  . Cardiac catheterization    . Cardiac pacemaker placement  2005  . Cataract extraction w/phaco Right 06/14/2014    Procedure: CATARACT EXTRACTION PHACO AND INTRAOCULAR LENS PLACEMENT (IOC);  Surgeon: Leandrew Koyanagi, MD;  Location: [facility];  Service: Ophthalmology;  Laterality: Right;    SOCIAL HISTORY:   History  Substance Use Topics  . Smoking status: Never Smoker   . Smokeless tobacco: Not on file  . Alcohol Use: No    FAMILY HISTORY:   Family History  Problem Relation Age of Onset  . Adopted: Yes  . Heart disease Mother   . Fibromyalgia Sister   . Hypertension Daughter   . Migraines Daughter   . Kidney disease Neg Hx   . Prostate cancer Neg Hx   . Bladder Cancer Neg Hx     DRUG ALLERGIES:   Allergies  Allergen Reactions  . Sulfa Antibiotics Hives    Other reaction(s): Skin Rashes    REVIEW OF SYSTEMS:  CONSTITUTIONAL: No fever, fatigue or weakness.  EYES: No blurred or double vision.  EARS, NOSE, AND THROAT: No tinnitus or ear pain.  RESPIRATORY: No cough, shortness of breath, wheezing or hemoptysis.  CARDIOVASCULAR: No chest pain, orthopnea, edema.  GASTROINTESTINAL: No nausea, vomiting, diarrhea or abdominal pain.  GENITOURINARY: Has dysuria, hematuria. And also has frequent urination. ENDOCRINE: No polyuria, nocturia,  HEMATOLOGY: No anemia, easy bruising or bleeding SKIN: No rash or lesion. MUSCULOSKELETAL: No joint pain or arthritis.   NEUROLOGIC: No Tingling or numbness.[name] PSYCHIATRY: No anxiety or depression.   MEDICATIONS AT HOME:   Prior to Admission medications   Medication Sig Start Date End Date Taking? Authorizing Provider  aspirin 81 MG chewable tablet Chew 1 tablet by mouth daily.    Historical  Provider, MD  doxycycline (VIBRA-TABS) 100 MG tablet Take 1 tablet (100 mg total) by mouth 2 (two) times daily. 08/03/14   [name], PA  fluorouracil (EFUDEX) 5 % cream 1 application 2 (two) times daily. 04/24/14   Historical Provider, MD  glipiZIDE (GLUCOTROL) 5 MG tablet Take 1 tablet (5 mg total) by mouth 2 (two) times daily before a meal. 07/17/14   [name], PA  ibuprofen (ADVIL,MOTRIN) 800 MG tablet Take 850 mg by mouth every 8 (eight) hours as needed.    Historical Provider, MD  lisinopril (PRINIVIL,ZESTRIL) 20 MG tablet Take 1 tablet by mouth daily. 12/30/13   Historical Provider, MD  lovastatin (MEVACOR) 10 MG tablet Take 2 tablets (20 mg total) by mouth daily. 07/05/14   [name], PA  metFORMIN (GLUCOPHAGE) 850 MG tablet Take 1 tablet (850 mg total) by mouth 2 (two) times daily with a meal. 07/07/14   [name], PA  metoprolol (LOPRESSOR) 100 MG tablet Take 1 tablet by mouth 2 (two) times daily. AM 12/29/13   Historical Provider, MD  nitrofurantoin, macrocrystal-monohydrate, (MACROBID) 100 MG capsule Take 1 capsule (100 mg total) by mouth 2 (two) times daily. 07/25/14 08/24/14  Larene Beach A McGowan, PA-C  nystatin cream (MYCOSTATIN) Apply 1 application topically 2 (two) times daily. 07/17/14   Nori Riis, PA-C  traMADol-acetaminophen (ULTRACET) 37.5-325 MG per tablet Take 1 tablet by mouth as needed.    Historical Provider, MD  warfarin (COUMADIN) 10 MG tablet Take 1 tablet (10 mg total) by mouth daily. New dose 07/05/14   [name], PA      VITAL SIGNS:  Blood pressure 160/99, pulse 104, temperature 98.6 F (37 C), temperature source Oral, resp. rate 19, height 5' 8"  (1.727 m), weight 134.718 kg (297 lb), SpO2 99 %.  PHYSICAL EXAMINATION:  GENERAL:  64 y.o.-year-old patient lying in the bed with no acute distress.  EYES: Pupils equal, round, reactive to light and accommodation. No scleral icterus. Extraocular muscles intact.  HEENT: Head atraumatic, normocephalic.  Oropharynx and nasopharynx clear.  NECK:  Supple, no jugular venous distention. No thyroid enlargement, no tenderness.  LUNGS: Normal breath sounds bilaterally, no wheezing, rales,rhonchi or crepitation. No use of accessory muscles of respiration.  CARDIOVASCULAR: S1, S2 normal. No murmurs, rubs, or gallops.  ABDOMEN: Soft, nontender, nondistended. Bowel sounds present. No organomegaly or mass.  EXTREMITIES: No pedal edema, cyanosis, or clubbing.  NEUROLOGIC: Cranial nerves II through XII are intact. Muscle strength 5/5 in all extremities. Sensation intact. Gait not checked.  PSYCHIATRIC: The patient is alert and oriented x 3.  SKIN: No obvious rash, lesion, or ulcer.   LABORATORY PANEL:   CBC  Recent Labs Lab 08/07/14 1447  WBC 6.1  HGB 14.1  HCT 41.8  PLT 143*   ------------------------------------------------------------------------------------------------------------------  Chemistries   Recent Labs Lab 08/07/14 1447  NA 137  K 3.7  CL 104  CO2 23  GLUCOSE 247*  BUN 10  CREATININE 0.91  CALCIUM 9.5   ------------------------------------------------------------------------------------------------------------------  Cardiac Enzymes No  results for input(s): TROPONINI in the last 168 hours. ------------------------------------------------------------------------------------------------------------------  RADIOLOGY:  Dg Chest 2 View  08/07/2014   CLINICAL DATA:  Fever and tachycardia with chills  EXAM: CHEST - 2 VIEW  COMPARISON:  09/18/2013  FINDINGS: Cardiac shadow remains mildly enlarged. A pacing device is again seen and stable. The lungs are well aerated bilaterally. No focal infiltrate or sizable effusion is seen. No acute bony abnormality is noted.  IMPRESSION: No acute abnormality seen.   Electronically Signed   By: Inez Catalina M.D.   On: 08/07/2014 16:09    EKG:   Orders placed or performed during the hospital encounter of 08/07/14  . ED EKG  . ED EKG    EKG showed atrial fibrillation with RVR 1 19 bpm  IMPRESSION AND PLAN:   #94:64 year old male patient with chills fatigue and fever and lactic acidosis likely sepsis secondary to UTI, . Urine cultures from June 24 showed enterococci. Follow blood culture.start him on vancomycin and Zosyn .consult urology because of his hematuria and dysuria and also left. Renal Calculus. #2 A. fib with RVR likely secondary to sepsis continue fluids, continue Coumadin, metoprolol. Adjust the Coumadin level according to INR.  3: Chronic back pain patient is on tramadol and Tylenol for 20 years and he does have portal hypertension and cirrhosis and the workup is pending and most likely the cause for her cirrhosis is drug-induced.  Diabetes type II continue glipizide, metformin   All the records are reviewed and case discussed with ED provider. Management plans discussed with the patient, family and they are in agreement.  CODE STATUS:Full  TOTAL TIME TAKING CARE OF THIS PATIENT: 74s.    Epifanio Lesches M.D on 08/07/2014 at 6:07 PM  Between 7am to 6pm - Pager - [phone]  After 6pm go to [email or web address] - password [email or web address] [facility]  [facility]  Office  [phone]  CC: Primary care physician; [name], PA

## 2014-08-07 NOTE — Progress Notes (Signed)
ANTIBIOTIC CONSULT NOTE - INITIAL  Pharmacy Consult for Vancomycin/Zosyn Indication: rule out sepsis  Allergies  Allergen Reactions  . Sulfa Antibiotics Hives    Patient Measurements: Height: 5' 8"  (172.7 cm) Weight: 297 lb (134.718 kg) IBW/kg (Calculated) : 68.4 Adjusted Body Weight: 95 kg  Vital Signs: Temp: 99.1 F (37.3 C) (07/11 2001) Temp Source: Oral (07/11 2001) BP: 161/110 mmHg (07/11 2001) Pulse Rate: 115 (07/11 2001) Intake/Output from previous day:   Intake/Output from this shift: Total I/O In: -  Out: 250 [Urine:250]  Labs:  Recent Labs  08/07/14 1447  WBC 6.1  HGB 14.1  PLT 143*  CREATININE 0.91   Estimated Creatinine Clearance: 108.6 mL/min (by C-G formula based on Cr of 0.91). No results for input(s): VANCOTROUGH, VANCOPEAK, VANCORANDOM, GENTTROUGH, GENTPEAK, GENTRANDOM, TOBRATROUGH, TOBRAPEAK, TOBRARND, AMIKACINPEAK, AMIKACINTROU, AMIKACIN in the last 72 hours.   Microbiology: Recent Results (from the past 720 hour(s))  Microscopic Examination     Status: None   Collection Time: 07/21/14 12:39 PM  Result Value Ref Range Status   WBC, UA 0-5 0 -  5 /hpf Final   RBC, UA 0-2 0 -  2 /hpf Final   Epithelial Cells (non renal) 0-10 0 - 10 /hpf Final   Bacteria, UA None seen None seen/Few Final  CULTURE, URINE COMPREHENSIVE     Status: Abnormal   Collection Time: 07/21/14 12:40 PM  Result Value Ref Range Status   Urine Culture, Comprehensive Final report (A)  Final   Result 1 Enterococcus species (A)  Final    Comment: 10,000-25,000 colony forming units per mL Note: this isolate is vancomycin-susceptible. This information is provided for epidemiologic purposes only: vancomycin is not among the antibiotics recommended for therapy of urinary tract infections caused by Enterococcus.    Result 2 Comment (A)  Final    Comment: Escherichia coli, identified by an automated biochemical system. 10,000-25,000 colony forming units per mL    ANTIMICROBIAL SUSCEPTIBILITY Comment  Final    Comment:       ** S = Susceptible; I = Intermediate; R = Resistant **                    P = Positive; N = Negative             MICS are expressed in micrograms per mL    Antibiotic                 RSLT#1    RSLT#2    RSLT#3    RSLT#4 Amoxicillin/Clavulanic Acid              R Ampicillin                     R         R Cefepime                                 S Ceftriaxone                              S Cefuroxime                               I Cephalothin  R Ciprofloxacin                  S         R Ertapenem                                S Gentamicin                     S         S Imipenem                                 S Levofloxacin                   S         R Nitrofurantoin                 S         S Penicillin                     S Piperacillin                             R Tetracycline                   R         S Tobramycin                               R Trimethoprim/Sulfa             S         S Vancomycin                     S      Medical History: Past Medical History  Diagnosis Date  . Hypertension   . Presence of permanent cardiac pacemaker     Inactive  . CHF (congestive heart failure)   . Peripheral vascular disease     Carotid stenosis  . TIA (transient ischemic attack) 2011    No deficits  . Hypercholesteremia   . Kidney stones   . Degenerative disc disease, lumbar     s/p injury  . Diabetes mellitus without complication   . Disseminated superficial actinic porokeratosis   . Dysrhythmia     A-FIB, palpatations    Medications:  Scheduled:  . doxycycline  100 mg Oral BID  . glipiZIDE  5 mg Oral BID AC  . metFORMIN  850 mg Oral BID WC  . metoprolol  100 mg Oral BID  . piperacillin-tazobactam (ZOSYN)  IV  3.375 g Intravenous 3 times per day  . pravastatin  10 mg Oral q1800  . vancomycin  1,000 mg Intravenous Once  . [START ON 08/08/2014] vancomycin  1,000 mg  Intravenous Q8H  . warfarin  10 mg Oral Daily  . [START ON 08/08/2014] Warfarin - Physician Dosing Inpatient   Does not apply q1800   Assessment: Patient is a 64 yo male admitted for possible sepsis.  Pharmacy consulted to dose Vancomycin and Zosyn for empiric treatment.   Goal of Therapy:  Vancomycin trough level 15-20 mcg/ml  Plan:  Patient received Vancomycin 1 gm IV once in ED at 1631.  Will order Vancomycin 1 gm IV once to be given 6 hours later for stacked dosing.  Will then start Vancomycin 1 gm IV q8h.  Check trough prior to 5th dose on 7/13 at 0630.  Will order Zosyn 3.375 gm IV q8h per EI protocol.   Measure antibiotic drug levels at steady state Follow up culture results   Pharmacy will continue to follow.  Shanira Tine G 08/07/2014,9:39 PM

## 2014-08-07 NOTE — ED Notes (Deleted)
Lab called CRITICAL Lactic acid value of 2.4 - primary RN on 2c and Dr. Jannifer Franklin made aware.

## 2014-08-07 NOTE — ED Notes (Signed)
Lactic acid 2.4 called to [facility], RN and Dr. Jannifer Franklin notified as well.

## 2014-08-07 NOTE — ED Notes (Signed)
Critical lactic acid value taken from lab and called to 2c. Lactic acid 2.4. Will notify admitting physician as well.

## 2014-08-07 NOTE — ED Notes (Signed)
Pt presents with fatigue. Pt dx and is currently being treated for tick fever and a UTI , kidney infection. Pt PCP advised for further eval by ED MD.

## 2014-08-07 NOTE — ED Notes (Signed)
Lab called critical lactic acid value however patient is already in room 225. Called and gave value to primary nurse. Lactic acid 2.4 and will notify admitting physician as well.

## 2014-08-07 NOTE — ED Notes (Signed)
Lactic acid=4.0 Dr. Edd Fabian aware.

## 2014-08-07 NOTE — ED Notes (Signed)
Lab called critical lactic acid value

## 2014-08-07 NOTE — Progress Notes (Addendum)
   08/07/14 2001  Vitals  BP (!) 161/110 mmHg  Dr. Vianne Bulls notified of elevated BP and HR. Pt continues to receive boluses per sepsis protocol. No SOB noted. Lung sounds are clear for now.  Hx of CHF.  Asked MD if she wanted to continue boluses and wanted to give evening dose of lopressor early. MD said stop boluses and decrease continuous fluids to 62m/hr and give lopressor dose that was scheduled for  2200 now. Will cont to monitor pt.

## 2014-08-07 NOTE — ED Notes (Signed)
MD at bedside. 

## 2014-08-07 NOTE — ED Provider Notes (Signed)
[facility] Emergency Department Provider Note  ____________________________________________  Time seen: Approximately 3:15 PM  I have reviewed the triage vital signs and the nursing notes.   HISTORY  Chief Complaint Fatigue    HPI Christian Berg is a 64 y.o. male with atrial fibrillation on Coumadin, CHF, hypertension, hyperlipidemia, diabetes, currently being treated for tickborne illness as well as urinary tract infection who presents for evaluation of fever, fatigue. Patient reports that he has had high fever with maximum temperature of 102F for nearly a week. Was seen by his primary care doctor and started on doxycycline 5 days ago without improvement of his symptoms. He has also intermittently had hallucinations. He is also being treated with Macrobid for kidney infection/UTI. Symptoms have had gradual onset and have been intermittent. Currently he reports severe fatigue. No modifying factors. No cough, sneezing, runny nose, congestion, vomiting. He has had nonbloody diarrhea.   Past Medical History  Diagnosis Date  . Hypertension   . Presence of permanent cardiac pacemaker     Inactive  . CHF (congestive heart failure)   . Peripheral vascular disease     Carotid stenosis  . TIA (transient ischemic attack) 2011    No deficits  . Hypercholesteremia   . Kidney stones   . Degenerative disc disease, lumbar     s/p injury  . Diabetes mellitus without complication   . Disseminated superficial actinic porokeratosis   . Dysrhythmia     A-FIB, palpatations    Patient Active Problem List   Diagnosis Date Noted  . Kidney stones 07/24/2014  . Gross hematuria 07/24/2014  . Other cirrhosis of liver 07/24/2014  . DSAP (disseminated superficial actinic porokeratosis) 07/17/2014  . Peripheral blood vessel disorder 06/24/2014  . Asthma 05/31/2014  . Bilateral cataracts 05/31/2014  . Chronic diastolic heart failure 47/82/9562  . Cerebral vascular accident  05/31/2014  . Type II diabetes mellitus, uncontrolled 05/31/2014  . Actinic porokeratosis 05/31/2014  . Hypercholesteremia 05/31/2014  . Personal history of urinary calculi 05/31/2014  . Adiposity 05/31/2014  . 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 B12 deficiency 05/31/2014  . Carotid artery narrowing 02/15/2014  . 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 Benign prostatic hyperplasia with urinary obstruction 09/06/2012  . Incomplete bladder emptying 09/06/2012  . Phimosis/adherent prepuce 09/06/2012  . Disorder of male genital organs 09/06/2012  . Heart disease 05/29/2009  . Benign essential HTN 09/29/2008  . Cannot sleep 02/05/2007  . Narrowing of intervertebral disc space 01/11/2007  . Essential (primary) hypertension 12/07/2006  . A-fib 12/07/2006    Past Surgical History  Procedure Laterality Date  . Appendectomy  2004  . Cardiac catheterization    . Cardiac pacemaker placement  2005  . Cataract extraction w/phaco Right 06/14/2014    Procedure: CATARACT EXTRACTION PHACO AND INTRAOCULAR LENS PLACEMENT (IOC);  Surgeon: Leandrew Koyanagi, MD;  Location: [facility];  Service: Ophthalmology;  Laterality: Right;    Current Outpatient Rx  Name  Route  Sig  Dispense  Refill  . aspirin 81 MG chewable tablet   Oral   Chew 1 tablet by mouth daily.         [name] doxycycline (VIBRA-TABS) 100 MG tablet   Oral   Take 1 tablet (100 mg total) by mouth 2 (two) times daily.   20 tablet   0   . fluorouracil (EFUDEX) 5 % cream      1 application 2 (two) times daily.         [name]  glipiZIDE (GLUCOTROL) 5 MG tablet   Oral   Take 1 tablet (5 mg total) by mouth 2 (two) times daily before a meal.   60 tablet   3   . ibuprofen (ADVIL,MOTRIN) 800 MG tablet   Oral   Take 850 mg by mouth every 8 (eight) hours as needed.         [name] lisinopril (PRINIVIL,ZESTRIL) 20 MG tablet   Oral   Take 1 tablet by mouth daily.         [name] lovastatin  (MEVACOR) 10 MG tablet   Oral   Take 2 tablets (20 mg total) by mouth daily.   180 tablet   3   . metFORMIN (GLUCOPHAGE) 850 MG tablet   Oral   Take 1 tablet (850 mg total) by mouth 2 (two) times daily with a meal.   180 tablet   1   . metoprolol (LOPRESSOR) 100 MG tablet   Oral   Take 1 tablet by mouth 2 (two) times daily. AM         . nitrofurantoin, macrocrystal-monohydrate, (MACROBID) 100 MG capsule   Oral   Take 1 capsule (100 mg total) by mouth 2 (two) times daily.   60 capsule   0   . nystatin cream (MYCOSTATIN)   Topical   Apply 1 application topically 2 (two) times daily.   30 g   0   . traMADol-acetaminophen (ULTRACET) 37.5-325 MG per tablet   Oral   Take 1 tablet by mouth as needed.         . warfarin (COUMADIN) 10 MG tablet   Oral   Take 1 tablet (10 mg total) by mouth daily. New dose   30 tablet   5     Allergies Sulfa antibiotics  Family History  Problem Relation Age of Onset  . Adopted: Yes  . Heart disease Mother   . Fibromyalgia Sister   . Hypertension Daughter   . Migraines Daughter   . Kidney disease Neg Hx   . Prostate cancer Neg Hx   . Bladder Cancer Neg Hx     Social History History  Substance Use Topics  . Smoking status: Never Smoker   . Smokeless tobacco: Not on file  . Alcohol Use: No    Review of Systems Constitutional: + fever/chills Eyes: No visual changes. ENT: No sore throat. Cardiovascular: Denies chest pain. Respiratory: Denies shortness of breath. Gastrointestinal: No abdominal pain.  No nausea, no vomiting.  + diarrhea.  No constipation. Genitourinary: Negative for dysuria. Musculoskeletal: Negative for back pain. Skin: Negative for rash. Neurological: Negative for headaches, focal weakness or numbness.  10-point ROS otherwise negative.  ____________________________________________   PHYSICAL EXAM:  VITAL SIGNS: ED Triage Vitals  Enc Vitals Group     BP 08/07/14 1437 156/91 mmHg     Pulse Rate  08/07/14 1437 130     Resp --      Temp 08/07/14 1437 98.9 F (37.2 C)     Temp Source 08/07/14 1437 Oral     SpO2 08/07/14 1437 95 %     Weight 08/07/14 1437 297 lb (134.718 kg)     Height 08/07/14 1437 5' 8"  (1.727 m)     Head Cir --      Peak Flow --      Pain Score 08/07/14 1437 4     Pain Loc --      Pain Edu? --      Excl. in [place]? --  Constitutional: Alert and orientedx4. Fatigued but nontoxic-appearing and in no acute distress. Pleasant, cooperative and answers all questions appropriately. Eyes: Conjunctivae are normal. PERRL. EOMI. Head: Atraumatic. Nose: No congestion/rhinnorhea. Mouth/Throat: Mucous membranes are moist.  Oropharynx non-erythematous. Neck: No stridor.   Cardiovascular: tachycardic rate, irregularly irregular rhythm. Grossly normal heart sounds.  Good peripheral circulation. Respiratory: Normal respiratory effort.  Mild tachypnea. No retractions. Lungs CTAB. Gastrointestinal: Soft and nontender. No distention. No abdominal bruits. No CVA tenderness. Genitourinary: Deferred Musculoskeletal: No lower extremity tenderness nor edema.  No joint effusions. Neurologic:  Normal speech and language. No gross focal neurologic deficits are appreciated. Speech is normal. No gait instability. Skin:  Skin is warm, dry and intact. Brown scaling circular lesions throughout the legs which are chronic Psychiatric: Mood and affect are normal. Speech and behavior are normal.  ____________________________________________   LABS (all labs ordered are listed, but only abnormal results are displayed)  Labs Reviewed  BASIC METABOLIC PANEL - Abnormal; Notable for the following:    Glucose, Bld 247 (*)    All other components within normal limits  CBC - Abnormal; Notable for the following:    RBC 4.22 (*)    Platelets 143 (*)    All other components within normal limits  LACTIC ACID, PLASMA - Abnormal; Notable for the following:    Lactic Acid, Venous 4.0 (*)    All  other components within normal limits  APTT - Abnormal; Notable for the following:    aPTT 55 (*)    All other components within normal limits  PROTIME-INR - Abnormal; Notable for the following:    Prothrombin Time 31.5 (*)    All other components within normal limits  CULTURE, BLOOD (ROUTINE X 2)  CULTURE, BLOOD (ROUTINE X 2)  URINALYSIS COMPLETEWITH MICROSCOPIC ([facility] ONLY)  LACTIC ACID, PLASMA  B. BURGDORFI ANTIBODIES  CBG MONITORING, ED   ____________________________________________  EKG  ED ECG REPORT I, Joanne Gavel, the attending physician, personally viewed and interpreted this ECG.   Date: 08/07/2014  EKG Time: 14:58  Rate: 119  Rhythm: atrial fibrillation, rate 119  Axis: normal  Intervals:right bundle branch block  ST&T Change: No acute ST segment elevation  ____________________________________________  RADIOLOGY  CXR  FINDINGS: Cardiac shadow remains mildly enlarged. A pacing device is again seen and stable. The lungs are well aerated bilaterally. No focal infiltrate or sizable effusion is seen. No acute bony abnormality is noted.  IMPRESSION: No acute abnormality seen.   ____________________________________________   PROCEDURES  Procedure(s) performed: None  Critical Care performed: Yes, see critical care note(s). Total critical care time spent 35 minutes.  ____________________________________________   INITIAL IMPRESSION / ASSESSMENT AND PLAN / ED COURSE  Pertinent labs & imaging results that were available during my care of the patient were reviewed by me and considered in my medical decision making (see chart for details).  KORD MONETTE is a 64 y.o. male with atrial fibrillation on Coumadin, CHF, hypertension, hyperlipidemia, diabetes, currently being treated for tickborne illness as well as urinary tract infection who presents for evaluation of fever, fatigue. On exam, he is fatigued appearing but is in no acute distress. He is  tachycardic with intermittently increased respiratory rate in the mid 20s, meeting 2 out of 4 Sirs criteria. Source includes continued tickborne illness, poorly treated urinary tract infection, bacteremia. He denies symptoms of pneumonia but will obtain chest x-ray. We'll treat empirically with IV fluids, vancomycin, Zosyn. We'll obtain labs, blood culture, urine culture. Anticipate admission.   -----------------------------------------  5:20 PM on 08/07/2014 -----------------------------------------  Labs reviewed and are generally unremarkable with the exception of elevated lactic acid at 4.0. INR therapeutic at 3.04. Chest x-ray unremarkable. Awaiting urinalysis. Tachycardia has resolved at this time with the above treatment. Case discussed with hospitalist for admission.  FINAL CLINICAL IMPRESSION(S) / ED DIAGNOSES  Final diagnoses:  SIRS (systemic inflammatory response syndrome)     ____________________________________________   FINAL CLINICAL IMPRESSION(S) / ED DIAGNOSES  Final diagnoses:  SIRS (systemic inflammatory response syndrome)      Joanne Gavel, MD 08/07/14 1722

## 2014-08-07 NOTE — ED Notes (Signed)
Per Dr. Edd Fabian, hang IV fluids at slow rate due to history of congestive heart failure.

## 2014-08-08 LAB — CBC
HCT: 38.9 % — ABNORMAL LOW (ref 40.0–52.0)
Hemoglobin: 13.1 g/dL (ref 13.0–18.0)
MCH: 33.8 pg (ref 26.0–34.0)
MCHC: 33.6 g/dL (ref 32.0–36.0)
MCV: 100.5 fL — ABNORMAL HIGH (ref 80.0–100.0)
Platelets: 112 10*3/uL — ABNORMAL LOW (ref 150–440)
RBC: 3.87 MIL/uL — ABNORMAL LOW (ref 4.40–5.90)
RDW: 13.9 % (ref 11.5–14.5)
WBC: 5.1 10*3/uL (ref 3.8–10.6)

## 2014-08-08 LAB — BASIC METABOLIC PANEL
Anion gap: 8 (ref 5–15)
BUN: 10 mg/dL (ref 6–20)
CO2: 23 mmol/L (ref 22–32)
Calcium: 8.8 mg/dL — ABNORMAL LOW (ref 8.9–10.3)
Chloride: 108 mmol/L (ref 101–111)
Creatinine, Ser: 0.9 mg/dL (ref 0.61–1.24)
GFR calc Af Amer: >60 mL/min (ref >60)
GFR calc non Af Amer: >60 mL/min (ref >60)
Glucose, Bld: 209 mg/dL — ABNORMAL HIGH (ref 65–99)
Potassium: 3.6 mmol/L (ref 3.5–5.1)
Sodium: 139 mmol/L (ref 135–145)

## 2014-08-08 LAB — PROTIME-INR
INR: 3.15
Prothrombin Time: 32.4 s — ABNORMAL HIGH (ref 11.4–15.0)

## 2014-08-08 LAB — B. BURGDORFI ANTIBODIES: B burgdorferi Ab IgG+IgM: <0.91 {ISR} (ref 0.00–0.90)

## 2014-08-08 LAB — LACTIC ACID, PLASMA: Lactic Acid, Venous: 3 mmol/L (ref 0.5–2.0)

## 2014-08-08 MED ORDER — TRAMADOL HCL 50 MG PO TABS: 100.0000 mg | ORAL_TABLET | ORAL | Status: DC | PRN

## 2014-08-08 MED ORDER — TRAMADOL HCL 50 MG PO TABS
100.0000 mg | ORAL_TABLET | ORAL | Status: DC | PRN
  Administered 2014-08-08: 100 mg via ORAL
  Filled 2014-08-08: qty 2

## 2014-08-08 NOTE — Progress Notes (Signed)
Patient ID: Christian Berg, male   DOB: 09-27-1950, 64 y.o.   MRN: [id number] [facility] PROGRESS NOTE  PCP: [name], PA  HPI/Subjective: Patient states that he's been battling a urinary tract infection with hematuria for a few months now. He had a CT scan as outpatient which showed a kidney stone and urologist Dr. Erlene Quan was supposed to do a stent tomorrow. A few days ago he had a high fever and has been on doxycycline for possible tick fever since Thursday. Today he feels much better than he did yesterday. He was having disorientation prior to coming in to the hospital.    Objective: Filed Vitals:   08/08/14 0750  BP: 144/82  Pulse:   Temp:   Resp:     Intake/Output Summary (Last 24 hours) at 08/08/14 1250 Last data filed at 08/08/14 1000  Gross per 24 hour  Intake    800 ml  Output   1275 ml  Net   -475 ml   Filed Weights   08/07/14 1437  Weight: 134.718 kg (297 lb)    ROS: Review of Systems  Constitutional: Positive for fever, chills and malaise/fatigue.  Eyes: Negative for blurred vision.  Respiratory: Negative for cough and shortness of breath.   Cardiovascular: Negative for chest pain.  Gastrointestinal: Negative for nausea, vomiting, abdominal pain, diarrhea and constipation.  Genitourinary: Negative for dysuria.  Musculoskeletal: Positive for back pain. Negative for joint pain.  Neurological: Negative for dizziness and headaches.   Exam: Physical Exam  HENT:  Nose: No mucosal edema.  Mouth/Throat: No oropharyngeal exudate or posterior oropharyngeal edema.  Eyes: Conjunctivae, EOM and lids are normal. Pupils are equal, round, and reactive to light.  Neck: No JVD present. Carotid bruit is not present. No edema present. No thyroid mass and no thyromegaly present.  Cardiovascular: S1 normal and S2 normal.  Exam reveals no gallop.   No murmur heard. Pulses:      Dorsalis pedis pulses are 2+ on the right side, and 2+ on the left side.   Respiratory: No respiratory distress. He has no wheezes. He has no rhonchi. He has no rales.  GI: Soft. Bowel sounds are normal. There is no tenderness.  Musculoskeletal:       Right ankle: He exhibits no swelling.       Left ankle: He exhibits no swelling.  Lymphadenopathy:    He has no cervical adenopathy.  Neurological: He is alert. No cranial nerve deficit.  Skin: Skin is warm. No rash noted. Nails show no clubbing.  Psychiatric: He has a normal mood and affect.    Data Reviewed: Basic Metabolic Panel:  Recent Labs Lab 08/07/14 1447 08/08/14 0550  NA 137 139  K 3.7 3.6  CL 104 108  CO2 23 23  GLUCOSE 247* 209*  BUN 10 10  CREATININE 0.91 0.90  CALCIUM 9.5 8.8*    CBC:  Recent Labs Lab 08/07/14 1447 08/08/14 0550  WBC 6.1 5.1  HGB 14.1 13.1  HCT 41.8 38.9*  MCV 99.0 100.5*  PLT 143* 112*     Recent Labs Lab 08/07/14 1732  GLUCAP 171*    Recent Results (from the past 240 hour(s))  Blood culture (routine x 2)     Status: None (Preliminary result)   Collection Time: 08/07/14  3:21 PM  Result Value Ref Range Status   Specimen Description BLOOD RIGHT ANTECUBITAL  Final   Special Requests BOTTLES DRAWN AEROBIC AND ANAEROBIC 2ML  Final   Culture NO  GROWTH < 24 HOURS  Final   Report Status PENDING  Incomplete  Blood culture (routine x 2)     Status: None (Preliminary result)   Collection Time: 08/07/14  3:40 PM  Result Value Ref Range Status   Specimen Description BLOOD LFA  Final   Special Requests BOTTLES DRAWN AEROBIC AND ANAEROBIC 1ML  Final   Culture NO GROWTH < 24 HOURS  Final   Report Status PENDING  Incomplete  Urine culture     Status: None (Preliminary result)   Collection Time: 08/07/14  5:24 PM  Result Value Ref Range Status   Specimen Description URINE, CLEAN CATCH  Final   Special Requests Normal  Final   Culture NO GROWTH <24 HRS  Final   Report Status PENDING  Incomplete     Studies: Dg Chest 2 View  08/07/2014   CLINICAL DATA:   Fever and tachycardia with chills  EXAM: CHEST - 2 VIEW  COMPARISON:  09/18/2013  FINDINGS: Cardiac shadow remains mildly enlarged. A pacing device is again seen and stable. The lungs are well aerated bilaterally. No focal infiltrate or sizable effusion is seen. No acute bony abnormality is noted.  IMPRESSION: No acute abnormality seen.   Electronically Signed   By: Inez Catalina M.D.   On: 08/07/2014 16:09    Scheduled Meds: . doxycycline  100 mg Oral BID  . glipiZIDE  5 mg Oral BID AC  . metFORMIN  850 mg Oral BID WC  . metoprolol  100 mg Oral BID  . piperacillin-tazobactam (ZOSYN)  IV  3.375 g Intravenous 3 times per day  . pravastatin  10 mg Oral q1800  . vancomycin  1,000 mg Intravenous Q8H  . warfarin  10 mg Oral Daily  . Warfarin - Physician Dosing Inpatient   Does not apply q1800    Assessment/Plan:  1. Clinical sepsis. So for unclear etiology whether this is a tickborne fever or acute cystitis with hematuria. So far blood cultures are negative. Awaiting urine culture. The patient was put on oral doxycycline from the other day and here in the hospital was put on vancomycin and Zosyn. Previous urine culture did have enterococcus and so prior to discharging the patient I would like to know what the urine culture grows. The patient did have tachycardia and a low-grade temperature. 2. Rapid atrial fibrillation- continue metoprolol and warfarin. Heart rate a little better controlled today. Anticoagulated with warfarin. 3. Hyperlipidemia unspecified continue pravastatin. 4. Diabetes type 2 controlled on glipizide and metformin. 5. Nephrolithiasis and hematuria- await urological opinion. Was scheduled to have a procedure tomorrow.  Code Status:     Code Status Orders        Start     Ordered   08/07/14 1756  Full code   Continuous     08/07/14 1807     Disposition Plan: Home once urine culture back  Consultants:  Urology  Time spent: 25 minutes  Loletha Grayer  [facility]

## 2014-08-08 NOTE — Progress Notes (Signed)
Pt admitted to 2c from ED. Alert and oriented. VSS at this time. Afebrile. BP improved.  C/o chronic back pain which ultracet helps. Given ultracet x2. No other complaints voiced. Continues on IV antibiotics.  Voiding without difficulty. No distress noted. No SOB.  Lungs clear. Will cont to monitor.

## 2014-08-08 NOTE — Progress Notes (Addendum)
Dr. Jannifer Franklin notified of pt's lactic acid critical result of 3.0. BP in WNL . Fluids 67m/hr.  No new orders noted.

## 2014-08-08 NOTE — Progress Notes (Signed)
Inpatient Diabetes Program Recommendations  AACE/ADA: New Consensus Statement on Inpatient Glycemic Control (2013)  Target Ranges:  Prepandial:   less than 140 mg/dL      Peak postprandial:   less than 180 mg/dL (1-2 hours)      Critically ill patients:  140 - 180 mg/dL    Reason for assessment: elevated lab glucose   Diabetes history: Type 2 Outpatient Diabetes medications: Glipizide 68m bid, Metformin 8541mbid Current orders for Inpatient glycemic control: Glipizide 61m361mid, Metformin 850m32md  A1C on 07/17/14 was 8.9%- please consider adding CBG testing tid and hs and Novolog correction moderate correction scale 0-15 units tid with meals and 0-5 units qhs while patient is in the hospital.  Christian Berg, BA, MHA, CDE Diabetes Coordinator Inpatient Diabetes Program  [phone] Pager) [phone]/2016 2:38 PM

## 2014-08-09 ENCOUNTER — Encounter: Payer: Self-pay | Admitting: Anesthesiology

## 2014-08-09 ENCOUNTER — Encounter
Admission: EM | Disposition: A | Discharge status: Home / Self Care | Payer: Self-pay | Source: Home / Self Care | Attending: Internal Medicine

## 2014-08-09 ENCOUNTER — Ambulatory Visit: Admission: RE | Admit: 2014-08-09 | Payer: Medicare Other | Source: Ambulatory Visit | Admitting: Urology

## 2014-08-09 ENCOUNTER — Inpatient Hospital Stay: Payer: Medicare Other | Admitting: Anesthesiology

## 2014-08-09 HISTORY — PX: CYSTOSCOPY W/ RETROGRADES: SHX1426

## 2014-08-09 LAB — URINE CULTURE
Culture: NO GROWTH
Special Requests: NORMAL

## 2014-08-09 LAB — PROTIME-INR
INR: 3.19
Prothrombin Time: 32.7 seconds — ABNORMAL HIGH (ref 11.4–15.0)

## 2014-08-09 LAB — ROCKY MTN SPOTTED FVR ABS PNL(IGG+IGM)
RMSF IgG: POSITIVE — AB
RMSF IgM: 0.27 index (ref 0.00–0.89)

## 2014-08-09 LAB — VANCOMYCIN, TROUGH: Vancomycin Tr: 18 ug/mL (ref 10–20)

## 2014-08-09 LAB — RMSF, IGG, IFA: RMSF, IGG, IFA: <1:64 {titer}

## 2014-08-09 LAB — GLUCOSE, CAPILLARY: Glucose-Capillary: 208 mg/dL — ABNORMAL HIGH (ref 65–99)

## 2014-08-09 SURGERY — CYSTOSCOPY, WITH RETROGRADE PYELOGRAM
Anesthesia: General | Laterality: Bilateral | Wound class: Clean Contaminated

## 2014-08-09 MED ORDER — FENTANYL CITRATE (PF) 100 MCG/2ML IJ SOLN
INTRAMUSCULAR | Status: DC | PRN
  Administered 2014-08-09: 100 ug via INTRAVENOUS
  Administered 2014-08-09: 25 ug via INTRAVENOUS

## 2014-08-09 MED ORDER — TRAMADOL-ACETAMINOPHEN 37.5-325 MG PO TABS: 3.0000 | ORAL_TABLET | Freq: Two times a day (BID) | ORAL | Status: DC

## 2014-08-09 MED ORDER — ONDANSETRON HCL 4 MG/2ML IJ SOLN: 4.0000 mg | Freq: Once | INTRAMUSCULAR | Status: DC | PRN

## 2014-08-09 MED ORDER — SODIUM CHLORIDE 0.9 % IV SOLN
INTRAVENOUS | Status: DC | PRN
  Administered 2014-08-09: 10:00:00 via INTRAVENOUS

## 2014-08-09 MED ORDER — MIDAZOLAM HCL 2 MG/2ML IJ SOLN
INTRAMUSCULAR | Status: DC | PRN
  Administered 2014-08-09: 1 mg via INTRAVENOUS

## 2014-08-09 MED ORDER — GLYCOPYRROLATE 0.2 MG/ML IJ SOLN
INTRAMUSCULAR | Status: DC | PRN
  Administered 2014-08-09: 0.2 mg via INTRAVENOUS

## 2014-08-09 MED ORDER — LISINOPRIL 20 MG PO TABS: 20.0000 mg | ORAL_TABLET | Freq: Every day | ORAL | Status: DC

## 2014-08-09 MED ORDER — METOPROLOL TARTRATE 100 MG PO TABS: 100.0000 mg | ORAL_TABLET | Freq: Two times a day (BID) | ORAL | Status: DC

## 2014-08-09 MED ORDER — LABETALOL HCL 5 MG/ML IV SOLN
5.0000 mg | INTRAVENOUS | Status: DC | PRN
  Administered 2014-08-09: 5 mg via INTRAVENOUS
  Filled 2014-08-09: qty 4

## 2014-08-09 MED ORDER — FENTANYL CITRATE (PF) 100 MCG/2ML IJ SOLN: 25.0000 ug | INTRAMUSCULAR | Status: DC | PRN

## 2014-08-09 MED ORDER — ACETAMINOPHEN 325 MG PO TABS: 325.0000 mg | ORAL_TABLET | Freq: Four times a day (QID) | ORAL | Status: DC | PRN

## 2014-08-09 MED ORDER — FLUOROURACIL 5 % EX CREA
1.0000 "application " | TOPICAL_CREAM | Freq: Two times a day (BID) | CUTANEOUS | Status: DC
  Filled 2014-08-09 (×2): qty 40

## 2014-08-09 MED ORDER — WARFARIN SODIUM 7.5 MG PO TABS: 7.5000 mg | ORAL_TABLET | Freq: Every day | ORAL | Status: DC

## 2014-08-09 MED ORDER — PROPOFOL 10 MG/ML IV BOLUS
INTRAVENOUS | Status: DC | PRN
  Administered 2014-08-09: 150 mg via INTRAVENOUS

## 2014-08-09 MED ORDER — ONDANSETRON HCL 4 MG/2ML IJ SOLN
INTRAMUSCULAR | Status: DC | PRN
  Administered 2014-08-09: 4 mg via INTRAVENOUS

## 2014-08-09 MED ORDER — ASPIRIN EC 81 MG PO TBEC: 81.0000 mg | DELAYED_RELEASE_TABLET | Freq: Every day | ORAL | Status: DC

## 2014-08-09 MED ORDER — IOTHALAMATE MEGLUMINE 43 % IV SOLN
INTRAVENOUS | Status: DC | PRN
  Administered 2014-08-09: 15 mL

## 2014-08-09 MED ORDER — LIDOCAINE HCL (CARDIAC) 20 MG/ML IV SOLN
INTRAVENOUS | Status: DC | PRN
  Administered 2014-08-09: 100 mg via INTRAVENOUS

## 2014-08-09 SURGICAL SUPPLY — 18 items
BAG DRAIN CYSTO-URO LG1000N (MISCELLANEOUS) ×2 IMPLANT
CATH URETL 5X70 OPEN END (CATHETERS) ×2 IMPLANT
CONRAY 43 FOR UROLOGY 50M (MISCELLANEOUS) ×2 IMPLANT
GLOVE BIO SURGEON STRL SZ 6.5 (GLOVE) ×2 IMPLANT
GLOVE BIO SURGEON STRL SZ7 (GLOVE) ×4 IMPLANT
GOWN STRL REUS W/ TWL LRG LVL3 (GOWN DISPOSABLE) ×2 IMPLANT
GOWN STRL REUS W/TWL LRG LVL3 (GOWN DISPOSABLE) ×2
JELLY LUB 2OZ STRL (MISCELLANEOUS) ×1
JELLY LUBE 2OZ STRL (MISCELLANEOUS) ×1 IMPLANT
KIT RM TURNOVER CYSTO AR (KITS) ×2 IMPLANT
PACK CYSTO AR (MISCELLANEOUS) ×2 IMPLANT
PREP PVP WINGED SPONGE (MISCELLANEOUS) ×2 IMPLANT
PUMP SINGLE ACTION SAP (PUMP) IMPLANT
SENSORWIRE 0.038 NOT ANGLED (WIRE) ×2
SET CYSTO W/LG BORE CLAMP LF (SET/KITS/TRAYS/PACK) ×2 IMPLANT
SOL .9 NS 3000ML IRR  AL (IV SOLUTION) ×1
SOL .9 NS 3000ML IRR UROMATIC (IV SOLUTION) ×1 IMPLANT
WIRE SENSOR 0.038 NOT ANGLED (WIRE) ×1 IMPLANT

## 2014-08-09 NOTE — Progress Notes (Signed)
ANTIBIOTIC CONSULT NOTE - FOLLOW UP   Pharmacy Consult for Vancomycin/Zosyn Indication: rule out sepsis  Allergies  Allergen Reactions  . Sulfa Antibiotics Hives    Patient Measurements: Height: 5' 8"  (172.7 cm) Weight: 297 lb (134.718 kg) IBW/kg (Calculated) : 68.4 Adjusted Body Weight: 95 kg  Vital Signs: Temp: 97 F (36.1 C) (07/13 1100) Temp Source: Oral (07/13 0753) BP: 139/94 mmHg (07/13 1100) Pulse Rate: 65 (07/13 1053) Intake/Output from previous day: 07/12 0701 - 07/13 0700 In: 780 [P.O.:480; IV Piggyback:300] Out: 800 [Urine:800] Intake/Output from this shift: Total I/O In: 300 [I.V.:300] Out: 200 [Urine:200]  Labs:  Recent Labs  08/07/14 1447 08/08/14 0550  WBC 6.1 5.1  HGB 14.1 13.1  PLT 143* 112*  CREATININE 0.91 0.90   Estimated Creatinine Clearance: 109.8 mL/min (by C-G formula based on Cr of 0.9).  Recent Labs  08/09/14 0543  [place] 18     Microbiology: Recent Results (from the past 720 hour(s))  Microscopic Examination     Status: None   Collection Time: 07/21/14 12:39 PM  Result Value Ref Range Status   WBC, UA 0-5 0 -  5 /hpf Final   RBC, UA 0-2 0 -  2 /hpf Final   Epithelial Cells (non renal) 0-10 0 - 10 /hpf Final   Bacteria, UA None seen None seen/Few Final  CULTURE, URINE COMPREHENSIVE     Status: Abnormal   Collection Time: 07/21/14 12:40 PM  Result Value Ref Range Status   Urine Culture, Comprehensive Final report (A)  Final   Result 1 Enterococcus species (A)  Final    Comment: 10,000-25,000 colony forming units per mL Note: this isolate is vancomycin-susceptible. This information is provided for epidemiologic purposes only: vancomycin is not among the antibiotics recommended for therapy of urinary tract infections caused by Enterococcus.    Result 2 Comment (A)  Final    Comment: Escherichia coli, identified by an automated biochemical system. 10,000-25,000 colony forming units per mL    ANTIMICROBIAL  SUSCEPTIBILITY Comment  Final    Comment:       ** S = Susceptible; I = Intermediate; R = Resistant **                    P = Positive; N = Negative             MICS are expressed in micrograms per mL    Antibiotic                 RSLT#1    RSLT#2    RSLT#3    RSLT#4 Amoxicillin/Clavulanic Acid              R Ampicillin                     R         R Cefepime                                 S Ceftriaxone                              S Cefuroxime                               I Cephalothin  R Ciprofloxacin                  S         R Ertapenem                                S Gentamicin                     S         S Imipenem                                 S Levofloxacin                   S         R Nitrofurantoin                 S         S Penicillin                     S Piperacillin                             R Tetracycline                   R         S Tobramycin                               R Trimethoprim/Sulfa             S         S Vancomycin                     S    Blood culture (routine x 2)     Status: None (Preliminary result)   Collection Time: 08/07/14  3:21 PM  Result Value Ref Range Status   Specimen Description BLOOD RIGHT ANTECUBITAL  Final   Special Requests BOTTLES DRAWN AEROBIC AND ANAEROBIC 2ML  Final   Culture NO GROWTH 2 DAYS  Final   Report Status PENDING  Incomplete  Blood culture (routine x 2)     Status: None (Preliminary result)   Collection Time: 08/07/14  3:40 PM  Result Value Ref Range Status   Specimen Description BLOOD LFA  Final   Special Requests BOTTLES DRAWN AEROBIC AND ANAEROBIC 1ML  Final   Culture NO GROWTH 2 DAYS  Final   Report Status PENDING  Incomplete  Urine culture     Status: None (Preliminary result)   Collection Time: 08/07/14  5:24 PM  Result Value Ref Range Status   Specimen Description URINE, CLEAN CATCH  Final   Special Requests Normal  Final   Culture NO GROWTH <24 HRS  Final    Report Status PENDING  Incomplete    Medical History: Past Medical History  Diagnosis Date  . Hypertension   . Presence of permanent cardiac pacemaker     Inactive  . CHF (congestive heart failure)   . Peripheral vascular disease     Carotid stenosis  . TIA (transient ischemic attack) 2011    No deficits  . Hypercholesteremia   . Kidney stones   . Degenerative disc  disease, lumbar     s/p injury  . Diabetes mellitus without complication   . Disseminated superficial actinic porokeratosis   . Dysrhythmia     A-FIB, palpatations    Medications:  Scheduled:  . doxycycline  100 mg Oral BID  . glipiZIDE  5 mg Oral BID AC  . metoprolol  100 mg Oral BID  . piperacillin-tazobactam (ZOSYN)  IV  3.375 g Intravenous 3 times per day  . pravastatin  10 mg Oral q1800  . vancomycin  1,000 mg Intravenous Q8H  . warfarin  10 mg Oral Daily  . Warfarin - Physician Dosing Inpatient   Does not apply q1800   Assessment: Patient is a 64 yo male admitted for possible sepsis.  Pharmacy consulted to dose Vancomycin and Zosyn for empiric treatment.   Trough resulted @ 18 mcg/ml.   Goal of Therapy:  Vancomycin trough level 15-20 mcg/ml  Plan:  Continue Vancomycin 1 g IV q8 hours.   Will order Zosyn 3.375 gm IV q8h per EI protocol.   Measure antibiotic drug levels at steady state Follow up culture results   Pharmacy will continue to follow.   Addendum: Will order SCr for AM to continue to follow renal function. Pt at risk for accumulation of vancomycin with BMI of 45.2. Will need to continue to monitor levels when clinically warranted.   Rayna Sexton L 08/09/2014,11:40 AM

## 2014-08-09 NOTE — Plan of Care (Signed)
Problem: Discharge Progression Outcomes Goal: Other Discharge Outcomes/Goals Outcome: Progressing - Complained of pain, PRN pain meds given with improvement. - Ambulates in room. - Possible Bx today, NPO after MN this shift. Will continue to monitor.

## 2014-08-09 NOTE — Anesthesia Preprocedure Evaluation (Addendum)
Anesthesia Evaluation  Patient identified by MRN, date of birth, ID band Patient awake    Reviewed: Allergy & Precautions, NPO status , Patient's Chart, lab work & pertinent test results, reviewed documented beta blocker date and time   Airway Mallampati: III  TM Distance: >3 FB     Dental  (+) Chipped   Pulmonary asthma ,          Cardiovascular hypertension, + Peripheral Vascular Disease and +CHF + dysrhythmias Atrial Fibrillation + pacemaker     Neuro/Psych TIA   GI/Hepatic   Endo/Other  diabetes, Type 2  Renal/GU Renal disease     Musculoskeletal  (+) Arthritis -,   Abdominal   Peds  Hematology   Anesthesia Other Findings Pt. Denies CVA. Obesity. Several yrs ago a pacemaker was put in  Unnecessarily according to the pt. He had a 'falling out spell"  Was put on a holter monitor which malfunctioned thru the night. The battery has run out sometime ago. The doctors decided not to change the battery.  Reproductive/Obstetrics                            Anesthesia Physical Anesthesia Plan  ASA: III  Anesthesia Plan: General   Post-op Pain Management:    Induction: Intravenous  Airway Management Planned: LMA  Additional Equipment:   Intra-op Plan:   Post-operative Plan:   Informed Consent: I have reviewed the patients History and Physical, chart, labs and discussed the procedure including the risks, benefits and alternatives for the proposed anesthesia with the patient or authorized representative who has indicated his/her understanding and acceptance.     Plan Discussed with: CRNA  Anesthesia Plan Comments:         Anesthesia Quick Evaluation

## 2014-08-09 NOTE — Discharge Instructions (Signed)
Sepsis Sepsis is a serious infection of your blood or tissues that affects your whole body. The infection that causes sepsis may be bacterial, viral, fungal, or parasitic. Sepsis may be life threatening. Sepsis can cause your blood pressure to drop. This may result in shock. Shock causes your central nervous system and your organs to stop working correctly.  RISK FACTORS Sepsis can happen in anyone, but it is more likely to happen in people who have weakened immune systems. SIGNS AND SYMPTOMS  Symptoms of sepsis can include:  Fever or low body temperature (hypothermia).  Rapid breathing (hyperventilation).  Chills.  Rapid heartbeat (tachycardia).  Confusion or light-headedness.  Trouble breathing.  Urinating much less than usual.  Cool, clammy skin or red, flushed skin.  Other problems with the heart, kidneys, or brain. DIAGNOSIS  Your health care provider will likely do tests to look for an infection, to see if the infection has spread to your blood, and to see how serious your condition is. Tests can include:  Blood tests, including cultures of your blood.  Cultures of other fluids from your body, such as:  Urine.  Pus from wounds.  Mucus coughed up from your lungs.  Urine tests other than cultures.  X-ray exams or other imaging tests. TREATMENT  Treatment will begin with elimination of the source of infection. If your sepsis is likely caused by a bacterial or fungal infection, you will be given antibiotic or antifungal medicines. You may also receive:  Oxygen.  Fluids through an IV tube.  Medicines to increase your blood pressure.  A machine to clean your blood (dialysis) if your kidneys fail.  A machine to help you breathe if your lungs fail. SEEK IMMEDIATE MEDICAL CARE IF: You get an infection or develop any of the signs and symptoms of sepsis after surgery or a hospitalization. Document Released: 10/12/2002 Document Revised: 01/18/2013 Document Reviewed:  09/20/2012 [facility] Patient Information 2015 [place], [place]. This information is not intended to replace advice given to you by your health care provider. Make sure you discuss any questions you have with your health care provider.

## 2014-08-09 NOTE — Progress Notes (Signed)
Urology Consult Follow Up  Subjective: 64 year old male well-known to our clinic admitted for sepsis of unclear etiology.  He is currently undergoing workup for gross hematuria and was scheduled for the OR today for cystoscopy, bilateral retrograde pyelogram. He was treated last week for enterococcus/Escherichia coli UTI which has since cleared. UA on 08/07/2014 completely negative without any signs of infection. Urine culture was also negative. Afebrile 24 hours during this admission, HD.  Anti-infectives: Anti-infectives    Start     Dose/Rate Route Frequency Ordered Stop   08/08/14 1100  vancomycin (VANCOCIN) 1,250 mg in sodium chloride 0.9 % 250 mL IVPB  Status:  Discontinued     1,250 mg 166.7 mL/hr over 90 Minutes Intravenous Every 12 hours 08/07/14 1933 08/07/14 1937   08/08/14 0700  vancomycin (VANCOCIN) IVPB 1000 mg/200 mL premix     1,000 mg 200 mL/hr over 60 Minutes Intravenous Every 8 hours 08/07/14 1937     08/07/14 2300  vancomycin (VANCOCIN) 1,250 mg in sodium chloride 0.9 % 250 mL IVPB  Status:  Discontinued     1,250 mg 166.7 mL/hr over 90 Minutes Intravenous  Once 08/07/14 1933 08/07/14 1937   08/07/14 2300  vancomycin (VANCOCIN) IVPB 1000 mg/200 mL premix     1,000 mg 200 mL/hr over 60 Minutes Intravenous  Once 08/07/14 1937 08/07/14 2323   08/07/14 2230  piperacillin-tazobactam (ZOSYN) IVPB 3.375 g     3.375 g 12.5 mL/hr over 240 Minutes Intravenous 3 times per day 08/07/14 1832     08/07/14 2200  doxycycline (VIBRA-TABS) tablet 100 mg     100 mg Oral 2 times daily 08/07/14 1807     08/07/14 1815  piperacillin-tazo (ZOSYN) NICU IV syringe 200 mg/mL  Status:  Discontinued     75 mg/kg  134.7 kg 101 mL/hr over 30 Minutes Intravenous Every 8 hours 08/07/14 1807 08/07/14 1832   08/07/14 1815  vancomycin (VANCOCIN) IVPB 1000 mg/200 mL premix  Status:  Discontinued     1,000 mg 200 mL/hr over 60 Minutes Intravenous Every 24 hours 08/07/14 1807 08/07/14 2103   08/07/14  1545  vancomycin (VANCOCIN) IVPB 1000 mg/200 mL premix     1,000 mg 200 mL/hr over 60 Minutes Intravenous  Once 08/07/14 1536 08/07/14 1804   08/07/14 1545  piperacillin-tazobactam (ZOSYN) IVPB 3.375 g     3.375 g 12.5 mL/hr over 240 Minutes Intravenous  Once 08/07/14 1536 08/07/14 1714      Current Facility-Administered Medications  Medication Dose Route Frequency Provider Last Rate Last Dose  . doxycycline (VIBRA-TABS) tablet 100 mg  100 mg Oral BID Epifanio Lesches, MD   100 mg at 08/08/14 2142  . glipiZIDE (GLUCOTROL) tablet 5 mg  5 mg Oral BID AC Epifanio Lesches, MD   5 mg at 08/08/14 1005  . metoprolol (LOPRESSOR) tablet 100 mg  100 mg Oral BID Epifanio Lesches, MD   100 mg at 08/08/14 2142  . piperacillin-tazobactam (ZOSYN) IVPB 3.375 g  3.375 g Intravenous 3 times per day Epifanio Lesches, MD   3.375 g at 08/09/14 0547  . pravastatin (PRAVACHOL) tablet 10 mg  10 mg Oral q1800 Epifanio Lesches, MD   10 mg at 08/08/14 1758  . traMADol (ULTRAM) tablet 100 mg  100 mg Oral Q4H PRN Loletha Grayer, MD   100 mg at 08/08/14 2351  . traMADol-acetaminophen (ULTRACET) 37.5-325 MG per tablet 1 tablet  1 tablet Oral Q6H PRN Epifanio Lesches, MD   1 tablet at 08/08/14 1819  . vancomycin (  VANCOCIN) IVPB 1000 mg/200 mL premix  1,000 mg Intravenous Q8H Epifanio Lesches, MD   1,000 mg at 08/09/14 0740  . warfarin (COUMADIN) tablet 10 mg  10 mg Oral Daily Epifanio Lesches, MD   10 mg at 08/08/14 1005  . Warfarin - Physician Dosing Inpatient   Does not apply q1800 Epifanio Lesches, MD         Objective: Vital signs in last 24 hours: Temp:  [97.8 F (36.6 C)-98.8 F (37.1 C)] 98.5 F (36.9 C) (07/13 0753) Pulse Rate:  [84-100] 100 (07/13 0753) Resp:  [18-20] 20 (07/13 0753) BP: (133-147)/(73-93) 147/93 mmHg (07/13 0753) SpO2:  [97 %-100 %] 98 % (07/13 0753)  Intake/Output from previous day: 07/12 0701 - 07/13 0700 In: 49 [P.O.:480; IV Piggyback:300] Out: 800  [Urine:800] Intake/Output this shift:     Physical Exam  Constitutional: He is oriented to person, place, and time and well-developed, well-nourished, and in no distress.  HENT:  Head: Normocephalic.  Neck: Normal range of motion.  Cardiovascular: Normal rate and regular rhythm.   Pulmonary/Chest: Effort normal and breath sounds normal.  Abdominal: Soft. Bowel sounds are normal.  Neurological: He is alert and oriented to person, place, and time.  Skin: Skin is warm and dry.  Psychiatric: Affect normal.    Lab Results:   Recent Labs  08/07/14 1447 08/08/14 0550  WBC 6.1 5.1  HGB 14.1 13.1  HCT 41.8 38.9*  PLT 143* 112*   BMET  Recent Labs  08/07/14 1447 08/08/14 0550  NA 137 139  K 3.7 3.6  CL 104 108  CO2 23 23  GLUCOSE 247* 209*  BUN 10 10  CREATININE 0.91 0.90  CALCIUM 9.5 8.8*   PT/INR  Recent Labs  08/08/14 0550 08/09/14 0546  LABPROT 32.4* 32.7*  INR 3.15 3.19   ABG No results for input(s): PHART, HCO3 in the last 72 hours.  Invalid input(s): PCO2, PO2  Studies/Results: Dg Chest 2 View  08/07/2014   CLINICAL DATA:  Fever and tachycardia with chills  EXAM: CHEST - 2 VIEW  COMPARISON:  09/18/2013  FINDINGS: Cardiac shadow remains mildly enlarged. A pacing device is again seen and stable. The lungs are well aerated bilaterally. No focal infiltrate or sizable effusion is seen. No acute bony abnormality is noted.  IMPRESSION: No acute abnormality seen.   Electronically Signed   By: Inez Catalina M.D.   On: 08/07/2014 16:09     Assessment: 1) Sepsis of unclear etiology, currently on vanc/zosyn, clinically staple, afebrile  2) History of gross hematuria (not currently)  Plan: I see no contraindication to proceeding with the procedure today as previously scheduled. Discussed this with the patient who is agreeable. He is currently nothing by mouth. Consent order place.      LOS: 2 days    Hollice Espy 08/09/2014

## 2014-08-09 NOTE — Care Management Important Message (Signed)
Important Message  Patient Details  Name: Christian Berg MRN: [id number] Date of Birth: Oct 29, 1950   Medicare Important Message Given:  Yes-second notification given    Juliann Pulse A Allmond 08/09/2014, 10:06 AM

## 2014-08-09 NOTE — Progress Notes (Signed)
Discharge instructions and follow-up appointments were given to pt. IV and tele was removed. Pt was taken downstairs via wheelchair to meet his wife.

## 2014-08-09 NOTE — Op Note (Signed)
Date of procedure: 08/09/2014  Preoperative diagnosis:  1. Hematuria   Postoperative diagnosis:  1. Same as above   Procedure: 1. Cystoscopy 2. Bilateral retrograde pyelogram  Surgeon: Hollice Espy, MD  Anesthesia: General  Complications: None  Intraoperative findings: Normal cystoscopy, normal bilateral retrogrades  EBL: Minimal  Specimens: None  Drains: None  Indication: Christian Berg is a 64 y.o. patient with with a history of gross hematuria was counseled to proceed to the operating room for cystoscopy, bilateral retrograde program to complete his workup.  After reviewing the management options for treatment, he elected to proceed with the above surgical procedure(s). We have discussed the potential benefits and risks of the procedure, side effects of the proposed treatment, the likelihood of the patient achieving the goals of the procedure, and any potential problems that might occur during the procedure or recuperation. Informed consent has been obtained.  Description of procedure:  The patient was taken to the operating room and general anesthesia was induced.  The patient was placed in the dorsal lithotomy position, prepped and draped in the usual sterile fashion, and preoperative antibiotics were administered. A preoperative time-out was performed.   The 82 French rigid cystoscope was advanced per urethra into the bladder.  Of note, the prosthetic urethra did have evidence of bilateral by crepitation was slightly friable.   The bladder was then carefully inspected using both a 30 and 70 lenses.  This revealed normal bladder mucosa, very subtle trabeculation, no ulcerations, tumors, or masses.  The trigone was normal with clear influx of urine from both UOs.  Attention was then turned to the left ureteral orifice, which was cannulated using a 5 [place] open-ended ureteral catheter.  A retrograde program was then performed using half diluted contrast.  This revealed a  delicate appearing ureter without evidence of dilation of filling defects.  The upper tract was also normal, decompress, without filling defects.  The same exact procedure was performed on the right side, which was also negative for any pathology.  These images were saved to PACS.  The scope was then removed.  The patient was then repositioned in the supine position, reversed from anesthesia, and taken to PACU in stable condition.  Hollice Espy, M.D.

## 2014-08-09 NOTE — Discharge Summary (Signed)
Christian Berg at [place] NAME: Christian Berg    MR#:  [id number]  DATE OF BIRTH:  1950/04/03  DATE OF ADMISSION:  08/07/2014 ADMITTING PHYSICIAN: Christian Lesches, MD  DATE OF DISCHARGE: 08/09/2014  PRIMARY CARE PHYSICIAN: Christian Ginsberg, PA    ADMISSION DIAGNOSIS:  SIRS (systemic inflammatory response syndrome) [A41.9]  DISCHARGE DIAGNOSIS:  Active Problems:   Sepsis   SECONDARY DIAGNOSIS:   Past Medical History  Diagnosis Date  . Hypertension   . Presence of permanent cardiac pacemaker     Inactive  . CHF (congestive heart failure)   . Peripheral vascular disease     Carotid stenosis  . TIA (transient ischemic attack) 2011    No deficits  . Hypercholesteremia   . Kidney stones   . Degenerative disc disease, lumbar     s/p injury  . Diabetes mellitus without complication   . Disseminated superficial actinic porokeratosis   . Dysrhythmia     A-FIB, palpatations    HOSPITAL COURSE:   1. Clinical sepsis patient with fever and chills, tachycardia and acute encephalopathy. Patient was suspected to have a tick borne fever and was on doxycycline for a few days as outpatient. Blood cultures and urine cultures remained negative during the hospital course. Patient was put on aggressive antibiotics with vancomycin and Zosyn and kept on the doxycycline. Mental status improved. Patient remained afebrile. I think we can continue the doxycycline as outpatient and get rid of the aggressive antibiotics. Follow-up appointment already scheduled for Friday as a follow-up. 2. Rapid atrial fibrillation patient was initially given IV fluids. Patient is now rate controlled with metoprolol. Anticoagulated with Coumadin. INR on discharge 3.1.  Decrease dose didn't down to 7.5 mg daily. 3. Essential hypertension- no changes in medications made. 4. Type 2 diabetes- sugars a little bit high since Glucophage held on admission 5. Hyperlipidemia  unspecified continue statin. 6. Patient has been battling hematuria and urinary tract infection as outpatient- Dr. Erlene Berg did a cystoscopy today. Our urine culture on this hospitalization negative. Follow-up with Dr. Erlene Berg as outpatient.  DISCHARGE CONDITIONS:   Satisfactory  CONSULTS OBTAINED:  Urology  DRUG ALLERGIES:   Allergies  Allergen Reactions  . Sulfa Antibiotics Hives    DISCHARGE MEDICATIONS:   Current Discharge Medication List    CONTINUE these medications which have NOT CHANGED   Details  acetaminophen (TYLENOL) 500 MG tablet Take 1,500 mg by mouth every 6 (six) hours as needed for fever.    aspirin EC 81 MG tablet Take 81 mg by mouth daily.    doxycycline (VIBRA-TABS) 100 MG tablet Take 1 tablet (100 mg total) by mouth 2 (two) times daily. Qty: 20 tablet, Refills: 0   Associated Diagnoses: Fever, unspecified fever cause; Body aches    fluorouracil (EFUDEX) 5 % cream Apply 1 application topically 2 (two) times daily.    glipiZIDE (GLUCOTROL) 5 MG tablet Take 1 tablet (5 mg total) by mouth 2 (two) times daily before a meal. Qty: 60 tablet, Refills: 3   Associated Diagnoses: Type 2 diabetes mellitus with complication    lisinopril (PRINIVIL,ZESTRIL) 20 MG tablet Take 20 mg by mouth daily.    lovastatin (MEVACOR) 10 MG tablet Take 2 tablets (20 mg total) by mouth daily. Qty: 180 tablet, Refills: 3   Associated Diagnoses: Hyperlipidemia    metFORMIN (GLUCOPHAGE) 850 MG tablet Take 1 tablet (850 mg total) by mouth 2 (two) times daily with a meal. Qty: 180 tablet, Refills: 1  Associated Diagnoses: Type 2 diabetes mellitus without complication    metoprolol (LOPRESSOR) 100 MG tablet Take 100 mg by mouth 2 (two) times daily.    nystatin cream (MYCOSTATIN) Apply 1 application topically 2 (two) times daily. Qty: 30 g, Refills: 0   Associated Diagnoses: Balanitis    traMADol-acetaminophen (ULTRACET) 37.5-325 MG per tablet Take 3 tablets by mouth 2 (two)  times daily.     warfarin (COUMADIN) 7.5 MG tablet Take 7.5 mg by mouth daily.      STOP taking these medications     nitrofurantoin, macrocrystal-monohydrate, (MACROBID) 100 MG capsule          DISCHARGE INSTRUCTIONS:   Satisfactory  If you experience worsening of your admission symptoms, develop shortness of breath, life threatening emergency, suicidal or homicidal thoughts you must seek medical attention immediately by calling 911 or calling your MD immediately  if symptoms less severe.  You Must read complete instructions/literature along with all the possible adverse reactions/side effects for all the Medicines you take and that have been prescribed to you. Take any new Medicines after you have completely understood and accept all the possible adverse reactions/side effects.   Please note  You were cared for by a hospitalist during your hospital stay. If you have any questions about your discharge medications or the care you received while you were in the hospital after you are discharged, you can call the unit and asked to speak with the hospitalist on call if the hospitalist that took care of you is not available. Once you are discharged, your primary care physician will handle any further medical issues. Please note that NO REFILLS for any discharge medications will be authorized once you are discharged, as it is imperative that you return to your primary care physician (or establish a relationship with a primary care physician if you do not have one) for your aftercare needs so that they can reassess your need for medications and monitor your lab values.    Today   CHIEF COMPLAINT:   Chief Complaint  Patient presents with  . Fatigue    HISTORY OF PRESENT ILLNESS:  Christian Berg  is a 64 y.o. male with a known history of atrial fibrillation and hypertension. He presents with altered mental status and fever.   VITAL SIGNS:  Blood pressure 138/73, pulse 103, temperature  97.8 F (36.6 C), temperature source Oral, resp. rate 14, height 5' 8"  (1.727 m), weight 134.718 kg (297 lb), SpO2 97 %.  I/O:   Intake/Output Summary (Last 24 hours) at 08/09/14 1320 Last data filed at 08/09/14 1228  Gross per 24 hour  Intake    990 ml  Output   1250 ml  Net   -260 ml    PHYSICAL EXAMINATION:  GENERAL:  64 y.o.-year-old patient lying in the bed with no acute distress.  EYES: Pupils equal, round, reactive to light and accommodation. No scleral icterus. Extraocular muscles intact.  HEENT: Head atraumatic, normocephalic. Oropharynx and nasopharynx clear.  NECK:  Supple, no jugular venous distention. No thyroid enlargement, no tenderness.  LUNGS: Normal breath sounds bilaterally, no wheezing, rales,rhonchi or crepitation. No use of accessory muscles of respiration.  CARDIOVASCULAR: S1, S2 irregularly irregular. No murmurs, rubs, or gallops.  ABDOMEN: Soft, non-tender, non-distended. Bowel sounds present. No organomegaly or mass.  EXTREMITIES: 2+ edema, no cyanosis, or clubbing.  NEUROLOGIC: Cranial nerves II through XII are intact. Muscle strength 5/5 in all extremities. Sensation intact. Gait not checked.  PSYCHIATRIC: The patient is alert  and oriented x 3.  SKIN: Chronic lower extremity skin discoloration  DATA REVIEW:   CBC  Recent Labs Lab 08/08/14 0550  WBC 5.1  HGB 13.1  HCT 38.9*  PLT 112*    Chemistries   Recent Labs Lab 08/08/14 0550  NA 139  K 3.6  CL 108  CO2 23  GLUCOSE 209*  BUN 10  CREATININE 0.90  CALCIUM 8.8*    Microbiology Results  Results for orders placed or performed during the hospital encounter of 08/07/14  Blood culture (routine x 2)     Status: None (Preliminary result)   Collection Time: 08/07/14  3:21 PM  Result Value Ref Range Status   Specimen Description BLOOD RIGHT ANTECUBITAL  Final   Special Requests BOTTLES DRAWN AEROBIC AND ANAEROBIC 2ML  Final   Culture NO GROWTH 2 DAYS  Final   Report Status PENDING   Incomplete  Blood culture (routine x 2)     Status: None (Preliminary result)   Collection Time: 08/07/14  3:40 PM  Result Value Ref Range Status   Specimen Description BLOOD LFA  Final   Special Requests BOTTLES DRAWN AEROBIC AND ANAEROBIC 1ML  Final   Culture NO GROWTH 2 DAYS  Final   Report Status PENDING  Incomplete  Urine culture     Status: None   Collection Time: 08/07/14  5:24 PM  Result Value Ref Range Status   Specimen Description URINE, CLEAN CATCH  Final   Special Requests Normal  Final   Culture NO GROWTH 1 DAY  Final   Report Status 08/09/2014 FINAL  Final    RADIOLOGY:  Dg Chest 2 View  08/07/2014   CLINICAL DATA:  Fever and tachycardia with chills  EXAM: CHEST - 2 VIEW  COMPARISON:  09/18/2013  FINDINGS: Cardiac shadow remains mildly enlarged. A pacing device is again seen and stable. The lungs are well aerated bilaterally. No focal infiltrate or sizable effusion is seen. No acute bony abnormality is noted.  IMPRESSION: No acute abnormality seen.   Electronically Signed   By: Inez Catalina M.D.   On: 08/07/2014 16:09    Management plans discussed with the patient, family and they are in agreement.  CODE STATUS:     Code Status Orders        Start     Ordered   08/07/14 1756  Full code   Continuous     08/07/14 1807      TOTAL TIME TAKING CARE OF THIS PATIENT: 35 minutes.    Loletha Grayer M.D on 08/09/2014 at 1:20 PM  Between 7am to 6pm - Pager - [phone]  After 6pm go to [email or web address] - password [email or web address] [email or web address]  [facility]  Office  [phone]  CC: Primary care physician; Christian Ginsberg, PA

## 2014-08-09 NOTE — Transfer of Care (Signed)
Immediate Anesthesia Transfer of Care Note  Patient: Christian Berg  Procedure(s) Performed: Procedure(s): CYSTOSCOPY WITH RETROGRADE PYELOGRAM (Bilateral)  Patient Location: PACU  Anesthesia Type:General  Level of Consciousness: awake, alert , oriented and patient cooperative  Airway & Oxygen Therapy: Patient Spontanous Breathing and Patient connected to face mask oxygen  Post-op Assessment: Report given to RN and Post -op Vital signs reviewed and stable  Post vital signs: Reviewed and stable  Last Vitals:  Filed Vitals:   08/09/14 1100  BP: 139/94  Pulse:   Temp: 36.1 C  Resp: 20    Complications: No apparent anesthesia complications

## 2014-08-09 NOTE — Progress Notes (Signed)
ANTIBIOTIC CONSULT NOTE - FOLLOW UP   Pharmacy Consult for Vancomycin/Zosyn Indication: rule out sepsis  Allergies  Allergen Reactions  . Sulfa Antibiotics Hives    Patient Measurements: Height: 5' 8"  (172.7 cm) Weight: 297 lb (134.718 kg) IBW/kg (Calculated) : 68.4 Adjusted Body Weight: 95 kg  Vital Signs: Temp: 98.5 F (36.9 C) (07/13 0019) Temp Source: Oral (07/13 0019) BP: 138/80 mmHg (07/13 0019) Pulse Rate: 99 (07/13 0019) Intake/Output from previous day: 07/12 0701 - 07/13 0700 In: 780 [P.O.:480; IV Piggyback:300] Out: 800 [Urine:800] Intake/Output from this shift:    Labs:  Recent Labs  08/07/14 1447 08/08/14 0550  WBC 6.1 5.1  HGB 14.1 13.1  PLT 143* 112*  CREATININE 0.91 0.90   Estimated Creatinine Clearance: 109.8 mL/min (by C-G formula based on Cr of 0.9).  Recent Labs  08/09/14 0543  [place] [facility]     Microbiology: Recent Results (from the past 720 hour(s))  Microscopic Examination     Status: None   Collection Time: 07/21/14 12:39 PM  Result Value Ref Range Status   WBC, UA 0-5 0 -  5 /hpf Final   RBC, UA 0-2 0 -  2 /hpf Final   Epithelial Cells (non renal) 0-10 0 - 10 /hpf Final   Bacteria, UA None seen None seen/Few Final  CULTURE, URINE COMPREHENSIVE     Status: Abnormal   Collection Time: 07/21/14 12:40 PM  Result Value Ref Range Status   Urine Culture, Comprehensive Final report (A)  Final   Result 1 Enterococcus species (A)  Final    Comment: 10,000-25,000 colony forming units per mL Note: this isolate is vancomycin-susceptible. This information is provided for epidemiologic purposes only: vancomycin is not among the antibiotics recommended for therapy of urinary tract infections caused by Enterococcus.    Result 2 Comment (A)  Final    Comment: Escherichia coli, identified by an automated biochemical system. 10,000-25,000 colony forming units per mL    ANTIMICROBIAL SUSCEPTIBILITY Comment  Final    Comment:       ** S =  Susceptible; I = Intermediate; R = Resistant **                    P = Positive; N = Negative             MICS are expressed in micrograms per mL    Antibiotic                 RSLT#1    RSLT#2    RSLT#3    RSLT#4 Amoxicillin/Clavulanic Acid              R Ampicillin                     R         R Cefepime                                 S Ceftriaxone                              S Cefuroxime                               I Cephalothin  R Ciprofloxacin                  S         R Ertapenem                                S Gentamicin                     S         S Imipenem                                 S Levofloxacin                   S         R Nitrofurantoin                 S         S Penicillin                     S Piperacillin                             R Tetracycline                   R         S Tobramycin                               R Trimethoprim/Sulfa             S         S Vancomycin                     S    Blood culture (routine x 2)     Status: None (Preliminary result)   Collection Time: 08/07/14  3:21 PM  Result Value Ref Range Status   Specimen Description BLOOD RIGHT ANTECUBITAL  Final   Special Requests BOTTLES DRAWN AEROBIC AND ANAEROBIC 2ML  Final   Culture NO GROWTH < 24 HOURS  Final   Report Status PENDING  Incomplete  Blood culture (routine x 2)     Status: None (Preliminary result)   Collection Time: 08/07/14  3:40 PM  Result Value Ref Range Status   Specimen Description BLOOD LFA  Final   Special Requests BOTTLES DRAWN AEROBIC AND ANAEROBIC 1ML  Final   Culture NO GROWTH < 24 HOURS  Final   Report Status PENDING  Incomplete  Urine culture     Status: None (Preliminary result)   Collection Time: 08/07/14  5:24 PM  Result Value Ref Range Status   Specimen Description URINE, CLEAN CATCH  Final   Special Requests Normal  Final   Culture NO GROWTH <24 HRS  Final   Report Status PENDING  Incomplete    Medical  History: Past Medical History  Diagnosis Date  . Hypertension   . Presence of permanent cardiac pacemaker     Inactive  . CHF (congestive heart failure)   . Peripheral vascular disease     Carotid stenosis  . TIA (transient ischemic attack) 2011    No deficits  . Hypercholesteremia   . Kidney stones   .  Degenerative disc disease, lumbar     s/p injury  . Diabetes mellitus without complication   . Disseminated superficial actinic porokeratosis   . Dysrhythmia     A-FIB, palpatations    Medications:  Scheduled:  . doxycycline  100 mg Oral BID  . glipiZIDE  5 mg Oral BID AC  . metoprolol  100 mg Oral BID  . piperacillin-tazobactam (ZOSYN)  IV  3.375 g Intravenous 3 times per day  . pravastatin  10 mg Oral q1800  . vancomycin  1,000 mg Intravenous Q8H  . warfarin  10 mg Oral Daily  . Warfarin - Physician Dosing Inpatient   Does not apply q1800   Assessment: Patient is a 64 yo male admitted for possible sepsis.  Pharmacy consulted to dose Vancomycin and Zosyn for empiric treatment.   Trough resulted @ 18 mcg/ml.   Goal of Therapy:  Vancomycin trough level 15-20 mcg/ml  Plan:  Continue Vancomycin 1 g IV q8 hours.   Will order Zosyn 3.375 gm IV q8h per EI protocol.   Measure antibiotic drug levels at steady state Follow up culture results   Pharmacy will continue to follow.  Autumm Hattery D 08/09/2014,7:03 AM

## 2014-08-09 NOTE — Progress Notes (Signed)
Pt prefers to take the rest of his daily medications at home since he has discharge orders.

## 2014-08-10 NOTE — Anesthesia Postprocedure Evaluation (Signed)
  Anesthesia Post-op Note  Patient: Christian Berg  Procedure(s) Performed: Procedure(s): CYSTOSCOPY WITH RETROGRADE PYELOGRAM (Bilateral)  Anesthesia type:General  Patient location: PACU  Post pain: Pain level controlled  Post assessment: Post-op Vital signs reviewed, Patient's Cardiovascular Status Stable, Respiratory Function Stable, Patent Airway and No signs of Nausea or vomiting  Post vital signs: Reviewed and stable  Last Vitals:  Filed Vitals:   08/09/14 1343  BP: 136/82  Pulse: 90  Temp: 36.3 C  Resp:     Level of consciousness: awake, alert  and patient cooperative  Complications: No apparent anesthesia complications

## 2014-08-11 ENCOUNTER — Ambulatory Visit (INDEPENDENT_AMBULATORY_CARE_PROVIDER_SITE_OTHER): Payer: Medicare Other | Admitting: Family Medicine

## 2014-08-11 ENCOUNTER — Encounter: Payer: Self-pay | Admitting: Family Medicine

## 2014-08-11 ENCOUNTER — Ambulatory Visit (INDEPENDENT_AMBULATORY_CARE_PROVIDER_SITE_OTHER): Payer: Medicare Other

## 2014-08-11 VITALS — BP 120/82 | HR 107 | Temp 98.6°F | Resp 18 | Wt 297.0 lb

## 2014-08-11 DIAGNOSIS — E1165 Type 2 diabetes mellitus with hyperglycemia: Secondary | ICD-10-CM

## 2014-08-11 DIAGNOSIS — I482 Chronic atrial fibrillation, unspecified: Secondary | ICD-10-CM

## 2014-08-11 DIAGNOSIS — A419 Sepsis, unspecified organism: Secondary | ICD-10-CM | POA: Diagnosis not present

## 2014-08-11 DIAGNOSIS — K7460 Unspecified cirrhosis of liver: Secondary | ICD-10-CM | POA: Diagnosis not present

## 2014-08-11 DIAGNOSIS — IMO0002 Reserved for concepts with insufficient information to code with codable children: Secondary | ICD-10-CM

## 2014-08-11 LAB — GLUCOSE, POCT (MANUAL RESULT ENTRY): POC Glucose: 160 mg/dl — AB (ref 70–99)

## 2014-08-11 LAB — POCT INR
INR: 3.2
PT: 29

## 2014-08-11 MED ORDER — GLIPIZIDE 10 MG PO TABS: 10.0000 mg | ORAL_TABLET | Freq: Two times a day (BID) | ORAL | Status: DC

## 2014-08-11 NOTE — Patient Instructions (Signed)
PT/INR today INR 3.2  Hold coumadin for one day, then 5 mg daily except 10 mg on Monday, Wednesday and Friday. Call if any questions or concerns. Come back for a PT/INR in 2 weeks.

## 2014-08-11 NOTE — Patient Instructions (Signed)
D/C Macrobid per hospital instructions

## 2014-08-11 NOTE — Progress Notes (Signed)
Subjective:     Patient ID: Christian Berg, male   DOB: [birth year], 64 y.o.   MRN: [id number]  HPI  Chief Complaint  Patient presents with  . Hospitalization Follow-up    Patient was admitted to [facility] 07/11-7/13 with complaints of fever and body ache. Diagnosis in hospital was Sepsis, patient states for the past two night he has had more frequent urination and complaints of dry mouth  He remains on Macrobid after urology evaluation and doxycyline to complete coverage of tick fever (RMSF and Lyme negative). Blood cultures have been negative to date. Reports continued malaise and frequent urination. Wishes referral to G.I. due to cirrhosis found incidentally on Abdominal CT scan. No hx of alcohol use.   Review of Systems  Constitutional: Positive for fatigue. Negative for fever and chills.       Does not appear to feel well.  Respiratory: Negative for cough.   Gastrointestinal: Positive for diarrhea.  Hematological:       Hospital blood cultures negative       Objective:   Physical Exam  Constitutional: He appears well-developed and well-nourished. No distress.       Assessment:    1. Type II diabetes mellitus, uncontrolled - POCT Glucose (CBG) - glipiZIDE (GLUCOTROL) 10 MG tablet; Take 1 tablet (10 mg total) by mouth 2 (two) times daily before a meal.  Dispense: 60 tablet; Refill: 3  2. Cirrhosis of liver without ascites, unspecified hepatic cirrhosis type - Ambulatory referral to Gastroenterology  3. Sepsis, due to unspecified organism     Plan:    Complete courses of oral antibiotic. Will try for tighter control of diabetes.

## 2014-08-12 LAB — CULTURE, BLOOD (ROUTINE X 2)
Culture: NO GROWTH
Culture: NO GROWTH

## 2014-08-14 ENCOUNTER — Encounter: Payer: Self-pay | Admitting: Gastroenterology

## 2014-08-25 ENCOUNTER — Ambulatory Visit (INDEPENDENT_AMBULATORY_CARE_PROVIDER_SITE_OTHER): Payer: Medicare Other

## 2014-08-25 DIAGNOSIS — I482 Chronic atrial fibrillation, unspecified: Secondary | ICD-10-CM

## 2014-08-25 LAB — POCT INR
INR: 2.3
Protime: 27.9 seconds

## 2014-08-25 NOTE — Patient Instructions (Signed)
Continue current dose of 50m M, W, F and 559mDaily.  Recheck in four weeks.

## 2014-09-05 ENCOUNTER — Encounter: Payer: Self-pay | Admitting: Gastroenterology

## 2014-09-05 ENCOUNTER — Ambulatory Visit (INDEPENDENT_AMBULATORY_CARE_PROVIDER_SITE_OTHER): Payer: Medicare Other | Admitting: Gastroenterology

## 2014-09-05 VITALS — BP 128/87 | HR 108 | Temp 98.3°F | Ht 68.0 in | Wt 290.0 lb

## 2014-09-05 DIAGNOSIS — K7460 Unspecified cirrhosis of liver: Secondary | ICD-10-CM | POA: Diagnosis not present

## 2014-09-05 DIAGNOSIS — R188 Other ascites: Secondary | ICD-10-CM

## 2014-09-05 NOTE — Progress Notes (Addendum)
Gastroenterology Consultation  Referring Provider:     [name], [place] Primary Care Physician:  [name], PA Primary Gastroenterologist:  Dr. Allen Norris     Reason for Consultation:     cirrhosis        HPI:   Christian Berg is a 64 y.o. y/o male referred for consultation & management of cirrhosis by Dr. [name], [place].  This patient comes today with a history of recent imaging that showed him to have cirrhosis. The patient had a CT scan done for urological reasons and the imaging showed him to have a nodular liver consistent with cirrhosis. The patient denies ever been told yet abnormal liver enzymes in the past. The patient also denies any alcohol abuse. There is no history of any high risk activity. The patient denied ever using IV drugs are getting transfusion before 1990. The patient also states that he does not have any tattoos. He does report that he is been overweight for most of his life. The patient also has diabetes and hypercholesterolemia.  Past Medical History  Diagnosis Date  . Hypertension   . Presence of permanent cardiac pacemaker     Inactive  . CHF (congestive heart failure)   . Peripheral vascular disease     Carotid stenosis  . TIA (transient ischemic attack) 2011    No deficits  . Hypercholesteremia   . Kidney stones   . Degenerative disc disease, lumbar     s/p injury  . Diabetes mellitus without complication   . Disseminated superficial actinic porokeratosis   . Dysrhythmia     A-FIB, palpatations    Past Surgical History  Procedure Laterality Date  . Appendectomy  2004  . Cardiac catheterization    . Cardiac pacemaker placement  2005  . Cataract extraction w/phaco Right 06/14/2014    Procedure: CATARACT EXTRACTION PHACO AND INTRAOCULAR LENS PLACEMENT (IOC);  Surgeon: Leandrew Koyanagi, MD;  Location: [place];  Service: Ophthalmology;  Laterality: Right;  . Cystoscopy w/ retrogrades Bilateral 08/09/2014    Procedure: CYSTOSCOPY  WITH RETROGRADE PYELOGRAM;  Surgeon: Hollice Espy, MD;  Location: [facility];  Service: Urology;  Laterality: Bilateral;    Prior to Admission medications   Medication Sig Start Date End Date Taking? Authorizing Provider  aspirin EC 81 MG tablet Take 81 mg by mouth daily.   Yes Historical Provider, MD  fluorouracil (EFUDEX) 5 % cream Apply 1 application topically 2 (two) times daily.   Yes Historical Provider, MD  glipiZIDE (GLUCOTROL) 10 MG tablet Take 1 tablet (10 mg total) by mouth 2 (two) times daily before a meal. 08/11/14  Yes [name], PA  lisinopril (PRINIVIL,ZESTRIL) 20 MG tablet Take 20 mg by mouth daily.   Yes Historical Provider, MD  lovastatin (MEVACOR) 10 MG tablet Take 2 tablets (20 mg total) by mouth daily. 07/05/14  Yes [name], PA  metFORMIN (GLUCOPHAGE) 850 MG tablet Take 1 tablet (850 mg total) by mouth 2 (two) times daily with a meal. 07/07/14  Yes [name], PA  metoprolol (LOPRESSOR) 100 MG tablet Take 100 mg by mouth 2 (two) times daily.   Yes Historical Provider, MD  nystatin cream (MYCOSTATIN) Apply 1 application topically 2 (two) times daily. 07/17/14  Yes Shannon A McGowan, PA-C  traMADol (ULTRAM) 50 MG tablet Take 50 mg by mouth every 6 (six) hours as needed.   Yes Historical Provider, MD  warfarin (COUMADIN) 10 MG tablet  07/05/14  Yes Historical Provider, MD  acetaminophen (TYLENOL)  500 MG tablet Take 1,500 mg by mouth every 6 (six) hours as needed for fever.    Historical Provider, MD  traMADol-acetaminophen (ULTRACET) 37.5-325 MG per tablet Take 3 tablets by mouth 2 (two) times daily.     Historical Provider, MD  warfarin (COUMADIN) 7.5 MG tablet Take 7.5 mg by mouth daily.    Historical Provider, MD    Family History  Problem Relation Age of Onset  . Adopted: Yes  . Heart disease Mother   . Fibromyalgia Sister   . Hypertension Daughter   . Migraines Daughter   . Kidney disease Neg Hx   . Prostate cancer Neg Hx   . Bladder Cancer Neg Hx       History  Substance Use Topics  . Smoking status: Never Smoker   . Smokeless tobacco: Never Used  . Alcohol Use: No    Allergies as of 09/05/2014 - Review Complete 09/05/2014  Allergen Reaction Noted  . Sulfa antibiotics Hives 05/31/2014    Review of Systems:    All systems reviewed and negative except where noted in HPI.   Physical Exam:  BP 128/87 mmHg  Pulse 108  Temp(Src) 98.3 F (36.8 C) (Oral)  Ht 5' 8"  (1.727 m)  Wt 290 lb (131.543 kg)  BMI 44.10 kg/m2 No LMP for male patient. Psych:  Alert and cooperative. Normal mood and affect. General:   Alert,  Well-developed, Obese, well-nourished, pleasant and cooperative in NAD Head:  Normocephalic and atraumatic. Eyes:  Sclera clear, no icterus.   Conjunctiva pink. Ears:  Normal auditory acuity. Nose:  No deformity, discharge, or lesions. Mouth:  No deformity or lesions,oropharynx pink & moist. Neck:  Supple; no masses or thyromegaly. Lungs:  Respirations even and unlabored.  Clear throughout to auscultation.   No wheezes, crackles, or rhonchi. No acute distress. Heart:  Regular rate and rhythm; no murmurs, clicks, rubs, or gallops. Abdomen:  Normal bowel sounds.  No bruits.  Soft, non-tender and non-distended without masses, hepatosplenomegaly or hernias noted.  No guarding or rebound tenderness.  Negative Carnett sign.   Rectal:  Deferred.  Msk:  Symmetrical without gross deformities.  Good, equal movement & strength bilaterally. Pulses:  Normal pulses noted. Extremities:  No clubbing or edema.  No cyanosis. Neurologic:  Alert and oriented x3;  grossly normal neurologically. Skin:  Intact without significant lesions or rashes.  No jaundice. Lymph Nodes:  No significant cervical adenopathy. Psych:  Alert and cooperative. Normal mood and affect.  Imaging Studies: Dg Chest 2 View  08/07/2014   CLINICAL DATA:  Fever and tachycardia with chills  EXAM: CHEST - 2 VIEW  COMPARISON:  09/18/2013  FINDINGS: Cardiac shadow  remains mildly enlarged. A pacing device is again seen and stable. The lungs are well aerated bilaterally. No focal infiltrate or sizable effusion is seen. No acute bony abnormality is noted.  IMPRESSION: No acute abnormality seen.   Electronically Signed   By: Inez Catalina M.D.   On: 08/07/2014 16:09    Assessment and Plan:   Christian Berg is a 64 y.o. y/o male  Who comes today with a history of recent imaging that showed cirrhosis. The patient will be set up for labs to look for the possible causes of his cirrhosis. He will also be set up for a right quadrant ultrasound. The patients most likely cause for his cirrhosis is fatty liver disease. The patient and the patient's wife have been explained the plan and agree with it.   Note: This  dictation was prepared with Dragon dictation along with smaller phrase technology. Any transcriptional errors that result from this process are unintentional.

## 2014-09-06 LAB — HEPATIC FUNCTION PANEL
ALT: 37 IU/L (ref 0–44)
AST: 61 IU/L — ABNORMAL HIGH (ref 0–40)
Albumin: 3.7 g/dL (ref 3.6–4.8)
Alkaline Phosphatase: 105 IU/L (ref 39–117)
Bilirubin Total: 1.3 mg/dL — ABNORMAL HIGH (ref 0.0–1.2)
Bilirubin, Direct: 0.35 mg/dL (ref 0.00–0.40)
Total Protein: 6.9 g/dL (ref 6.0–8.5)

## 2014-09-06 LAB — ALPHA-1-ANTITRYPSIN: A-1 Antitrypsin: 123 mg/dL (ref 90–200)

## 2014-09-06 LAB — IRON AND TIBC
Iron Saturation: 65 % — ABNORMAL HIGH (ref 15–55)
Iron: 179 ug/dL — ABNORMAL HIGH (ref 38–169)
Total Iron Binding Capacity: 275 ug/dL (ref 250–450)
UIBC: 96 ug/dL — ABNORMAL LOW (ref 111–343)

## 2014-09-06 LAB — HEPATITIS A ANTIBODY, TOTAL: Hep A Total Ab: POSITIVE — AB

## 2014-09-06 LAB — HEPATITIS B SURFACE ANTIBODY,QUALITATIVE: Hep B Surface Ab, Qual: NONREACTIVE

## 2014-09-06 LAB — PROTIME-INR
INR: 2 — ABNORMAL HIGH (ref 0.8–1.2)
Prothrombin Time: 20.8 s — ABNORMAL HIGH (ref 9.1–12.0)

## 2014-09-06 LAB — AFP TUMOR MARKER: AFP-Tumor Marker: 3.7 ng/mL (ref 0.0–8.3)

## 2014-09-06 LAB — HEPATITIS B SURFACE ANTIGEN: Hepatitis B Surface Ag: NEGATIVE

## 2014-09-06 LAB — ANTI-SMOOTH MUSCLE ANTIBODY, IGG: Smooth Muscle Ab: 28 Units — ABNORMAL HIGH (ref 0–19)

## 2014-09-06 LAB — MITOCHONDRIAL ANTIBODIES: Mitochondrial Ab: 12 Units (ref 0.0–20.0)

## 2014-09-06 LAB — CERULOPLASMIN: Ceruloplasmin: 27 mg/dL (ref 16.0–31.0)

## 2014-09-06 LAB — FERRITIN: Ferritin: 419 ng/mL — ABNORMAL HIGH (ref 30–400)

## 2014-09-08 ENCOUNTER — Telehealth: Payer: Self-pay

## 2014-09-08 DIAGNOSIS — R748 Abnormal levels of other serum enzymes: Secondary | ICD-10-CM

## 2014-09-08 NOTE — Telephone Encounter (Signed)
-----   Message from Lucilla Lame, MD sent at 09/06/2014 11:13 AM EDT ----- The patient know that his liver enzymes are slightly elevated but no other sign of a cause for the liver disease was seen area and we are still waiting for other labs, at this time.

## 2014-09-08 NOTE — Telephone Encounter (Signed)
Advised pt of results and the new lab order. Lab request faxed to [facility] on [address].

## 2014-09-08 NOTE — Telephone Encounter (Signed)
Per Dr. 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

## 2014-09-18 ENCOUNTER — Telehealth: Payer: Self-pay | Admitting: Gastroenterology

## 2014-09-18 NOTE — Telephone Encounter (Signed)
Please call patient with his lab/blood work results from a week ago. Thanks

## 2014-09-19 NOTE — Telephone Encounter (Signed)
Please review pt's ANA and IgG lab. I had to call [facility] to receive copy.

## 2014-09-19 NOTE — Telephone Encounter (Signed)
Please let the patient know that his antinuclear antigen was negative but his immunoglobulins for high. This may be consistent with autoimmune disease but is not a confirmation. The next step would be a transjugular liver biopsy by interventional radiology. If the patient would like to consider this we can set this up or he can come into the office to discuss it further.

## 2014-09-19 NOTE — Telephone Encounter (Signed)
Pt scheduled for a follow up to discuss labs and liver biopsy option with [facility] on 09-28-14.

## 2014-09-22 ENCOUNTER — Ambulatory Visit (INDEPENDENT_AMBULATORY_CARE_PROVIDER_SITE_OTHER): Payer: Medicare Other

## 2014-09-22 DIAGNOSIS — I482 Chronic atrial fibrillation, unspecified: Secondary | ICD-10-CM

## 2014-09-22 LAB — POCT INR: INR: 1.8

## 2014-09-22 NOTE — Patient Instructions (Addendum)
Take Coumadin 10 mg Daily from Monday to Friday and then 5 mg Saturday and Sunday.  Recheck in 2 weeks. Call if any questions or concerns.

## 2014-09-28 ENCOUNTER — Encounter: Payer: Self-pay | Admitting: Gastroenterology

## 2014-09-28 ENCOUNTER — Ambulatory Visit (INDEPENDENT_AMBULATORY_CARE_PROVIDER_SITE_OTHER): Payer: Medicare Other | Admitting: Gastroenterology

## 2014-09-28 ENCOUNTER — Encounter (INDEPENDENT_AMBULATORY_CARE_PROVIDER_SITE_OTHER): Payer: Self-pay

## 2014-09-28 VITALS — BP 159/79 | HR 96 | Temp 98.2°F | Ht 68.0 in | Wt 300.0 lb

## 2014-09-28 DIAGNOSIS — K7460 Unspecified cirrhosis of liver: Secondary | ICD-10-CM

## 2014-09-28 NOTE — Progress Notes (Signed)
Primary Care Physician: [name], PA  Primary Gastroenterologist:  Dr. Lucilla Lame  Chief Complaint  Patient presents with  . Discuss having liver biopsy    HPI: Christian Berg is a 64 y.o. male here follow-up after blood work done for his cirrhosis. 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 The patient has been obese most of his life and has never tried to lose weight. He now is here for follow-up of his labs and to discuss further recommendations.  Current Outpatient Prescriptions  Medication Sig Dispense Refill  . acetaminophen (TYLENOL) 500 MG tablet Take 1,500 mg by mouth every 6 (six) hours as needed for fever.    [name] aspirin EC 81 MG tablet Take 81 mg by mouth daily.    . fluorouracil (EFUDEX) 5 % cream Apply 1 application topically 2 (two) times daily.    [name] glipiZIDE (GLUCOTROL) 10 MG tablet Take 1 tablet (10 mg total) by mouth 2 (two) times daily before a meal. 60 tablet 3  . lisinopril (PRINIVIL,ZESTRIL) 20 MG tablet Take 20 mg by mouth daily.    [name] lovastatin (MEVACOR) 10 MG tablet Take 2 tablets (20 mg total) by mouth daily. 180 tablet 3  . metFORMIN (GLUCOPHAGE) 850 MG tablet Take 1 tablet (850 mg total) by mouth 2 (two) times daily with a meal. 180 tablet 1  . metoprolol (LOPRESSOR) 100 MG tablet Take 100 mg by mouth 2 (two) times daily.    [name] nystatin cream (MYCOSTATIN) Apply 1 application topically 2 (two) times daily. 30 g 0  . traMADol (ULTRAM) 50 MG tablet Take 50 mg by mouth every 6 (six) hours as needed.    . traMADol-acetaminophen (ULTRACET) 37.5-325 MG per tablet Take 3 tablets by mouth 2 (two) times daily.     [name] warfarin (COUMADIN) 10 MG tablet     . warfarin (COUMADIN) 7.5 MG tablet Take 7.5 mg by mouth daily.     No current facility-administered medications for this visit.    Allergies as of 09/28/2014 - Review Complete  09/05/2014  Allergen Reaction Noted  . Sulfa antibiotics Hives 05/31/2014    ROS:  General: Negative for anorexia, weight loss, fever, chills, fatigue, weakness. ENT: Negative for hoarseness, difficulty swallowing , nasal congestion. CV: Negative for chest pain, angina, palpitations, dyspnea on exertion, peripheral edema.  Respiratory: Negative for dyspnea at rest, dyspnea on exertion, cough, sputum, wheezing.  GI: See history of present illness. GU:  Negative for dysuria, hematuria, urinary incontinence, urinary frequency, nocturnal urination.  Endo: Negative for unusual weight change.    Physical Examination:   BP 159/79 mmHg  Pulse 96  Temp(Src) 98.2 F (36.8 C) (Oral)  Ht 5' 8"  (1.727 m)  Wt 300 lb (136.079 kg)  BMI 45.63 kg/m2  General: Well-nourished, well-developed in no acute distress. Obese. Eyes: No icterus. Conjunctivae pink. Mouth: Oropharyngeal mucosa moist and pink , no lesions erythema or exudate. Lungs: Clear to auscultation bilaterally. Non-labored. Heart: Regular rate and rhythm, no murmurs rubs or gallops.  Abdomen: Bowel sounds are normal, nontender, nondistended, no hepatosplenomegaly or masses, no abdominal bruits or hernia , no rebound or guarding.   Extremities: No lower extremity edema. No clubbing or deformities. Neuro: Alert and oriented x 3.  Grossly intact. Skin: Warm and dry, no jaundice.   Psych: Alert and cooperative, normal mood and affect.  Labs:  Imaging Studies: No results found.  Assessment and Plan:   [name] is a 64 y.o. y/o male who comes in today with a history of cirrhosis that is likely from fatty liver although his smooth muscle antibodies positive but only weakly. The patient has been told that there is a chance that he has autoimmune hepatitis although it is more likely has fatty liver. The weighted distinguish the two would be to get a liver biopsy. The patient will be set up for a transjugular liver biopsy. The  patient has been explained the plan and agrees with it.   Note: This dictation was prepared with Dragon dictation along with smaller phrase technology. Any transcriptional errors that result from this process are unintentional.

## 2014-10-03 ENCOUNTER — Encounter: Payer: Self-pay | Admitting: Family Medicine

## 2014-10-03 ENCOUNTER — Ambulatory Visit (INDEPENDENT_AMBULATORY_CARE_PROVIDER_SITE_OTHER): Payer: Medicare Other | Admitting: Family Medicine

## 2014-10-03 ENCOUNTER — Other Ambulatory Visit: Payer: Self-pay | Admitting: Family Medicine

## 2014-10-03 VITALS — BP 136/96 | HR 123 | Temp 100.2°F | Resp 18 | Wt 300.6 lb

## 2014-10-03 DIAGNOSIS — R319 Hematuria, unspecified: Secondary | ICD-10-CM | POA: Diagnosis not present

## 2014-10-03 DIAGNOSIS — N390 Urinary tract infection, site not specified: Secondary | ICD-10-CM | POA: Diagnosis not present

## 2014-10-03 LAB — POCT URINALYSIS DIPSTICK
Bilirubin, UA: NEGATIVE
Glucose, UA: 500
Ketones, UA: POSITIVE
Nitrite, UA: POSITIVE
Spec Grav, UA: >=1.03
Urobilinogen, UA: 1
pH, UA: 6

## 2014-10-03 MED ORDER — NITROFURANTOIN MONOHYD MACRO 100 MG PO CAPS: 100.0000 mg | ORAL_CAPSULE | Freq: Two times a day (BID) | ORAL | Status: DC

## 2014-10-03 NOTE — Progress Notes (Signed)
Subjective:     Patient ID: Christian Berg, male   DOB: December 20, 1950, 64 y.o.   MRN: [id number]  HPI  Chief Complaint  Patient presents with  . Urinary Tract Infection    Patient comes in office today with concerns of urinary tract infection. He states that he has been having pain in his lower back mainly on the left side, last night he had frequency of urination voiding 5x at night. This morning patient complains of burning with urination and fever.   Review of prior cultures reveals sensitivity to Macrobid. Hx of sepsis earlier this summer. He has had negative G.U. Workup for hematuria (one left sided non-obstructing stone). Accompanied by his wife today.  Review of Systems  Constitutional: Positive for chills.  Neurological:       Reports "electric" sensations running up from his mid-back to the back of his head for the last two weeks.       Objective:   Physical Exam  Constitutional: He appears well-developed and well-nourished. He has a sickly appearance.  Genitourinary:  Bilateral c.v.a tenderness       Assessment:    1. Urinary tract infection with hematuria, site unspecified - POCT Urinalysis Dipstick - Urine culture - nitrofurantoin, macrocrystal-monohydrate, (MACROBID) 100 MG capsule; Take 1 capsule (100 mg total) by mouth 2 (two) times daily.  Dispense: 14 capsule; Refill: 0    Plan:    Pending urine culture. If worsening sx in next 24 hours he is to report to the ER.

## 2014-10-03 NOTE — Patient Instructions (Signed)
We will call you with the urine culture results. If fever not improving or mental status changes: report to the ER.

## 2014-10-05 ENCOUNTER — Telehealth: Payer: Self-pay

## 2014-10-05 LAB — URINE CULTURE

## 2014-10-05 NOTE — Telephone Encounter (Signed)
[id number] 

## 2014-10-05 NOTE — Telephone Encounter (Signed)
-----   Message from [name], [place] sent at 10/05/2014 12:06 PM EDT ----- You have an E. Coli infection which is sensitive to the antibiotic you are on. Are you feeling better?

## 2014-10-06 ENCOUNTER — Ambulatory Visit (INDEPENDENT_AMBULATORY_CARE_PROVIDER_SITE_OTHER): Payer: Medicare Other | Admitting: Family Medicine

## 2014-10-06 ENCOUNTER — Ambulatory Visit: Payer: Medicare Other

## 2014-10-06 ENCOUNTER — Other Ambulatory Visit: Payer: Self-pay

## 2014-10-06 ENCOUNTER — Encounter: Payer: Self-pay | Admitting: Family Medicine

## 2014-10-06 VITALS — HR 112 | Temp 99.4°F | Resp 16

## 2014-10-06 DIAGNOSIS — I482 Chronic atrial fibrillation, unspecified: Secondary | ICD-10-CM

## 2014-10-06 DIAGNOSIS — I4891 Unspecified atrial fibrillation: Secondary | ICD-10-CM | POA: Diagnosis not present

## 2014-10-06 DIAGNOSIS — R319 Hematuria, unspecified: Secondary | ICD-10-CM

## 2014-10-06 DIAGNOSIS — N390 Urinary tract infection, site not specified: Secondary | ICD-10-CM | POA: Diagnosis not present

## 2014-10-06 DIAGNOSIS — K7460 Unspecified cirrhosis of liver: Secondary | ICD-10-CM

## 2014-10-06 LAB — POCT INR
INR: 2.4
INR: 2.4
PT: 29.1
PT: 29.1

## 2014-10-06 MED ORDER — CEFTRIAXONE SODIUM 1 G IJ SOLR
1.0000 g | Freq: Once | INTRAMUSCULAR | Status: AC
  Administered 2014-10-06: 1 g via INTRAMUSCULAR

## 2014-10-06 MED ORDER — DOXYCYCLINE HYCLATE 100 MG PO TABS: 100.0000 mg | ORAL_TABLET | Freq: Two times a day (BID) | ORAL | Status: DC

## 2014-10-06 NOTE — Progress Notes (Signed)
Subjective:     Patient ID: Christian Berg, male   DOB: 1950/11/22, 64 y.o.   MRN: [id number]  HPI  Chief Complaint  Patient presents with  . Follow-up    Patient is present in office today to follow up for UTI, patient was last seen in office 10/03/14, urine culture showed E.Coli patient was prescibed macrobid but states that his symptoms have not improved and now he is running a fever.  Wife states he continues to have a fever up to 100.7.   Review of Systems  Musculoskeletal: Positive for back pain.       Objective:   Physical Exam  Constitutional: He appears well-developed and well-nourished. He has a sickly appearance.       Assessment:    1. Urinary tract infection with hematuria, site unspecified - cefTRIAXone (ROCEPHIN) injection 1 g; Inject 1 g into the muscle once. - doxycycline (VIBRA-TABS) 100 MG tablet; Take 1 tablet (100 mg total) by mouth 2 (two) times daily.  Dispense: 20 tablet; Refill: 0 2. Atrial fibrillatioin - POCT INR    Plan:    Urine culture reviewed: abx given according to sensitivities. Will return on 9/12 if not improving. Discussed ER if worsening over the weekend.

## 2014-10-06 NOTE — Telephone Encounter (Signed)
Patient was seen in office today for follow up and was advised of labs. Kw

## 2014-10-06 NOTE — Patient Instructions (Signed)
Continue current dose.

## 2014-10-06 NOTE — Patient Instructions (Addendum)
Stop Macrobid/change to doxycycline. F/u 9/12 if not improving or ER over the weekend if worsening.

## 2014-10-09 ENCOUNTER — Ambulatory Visit: Payer: Medicare Other | Admitting: Family Medicine

## 2014-10-09 ENCOUNTER — Other Ambulatory Visit: Payer: Self-pay

## 2014-10-09 DIAGNOSIS — K7460 Unspecified cirrhosis of liver: Secondary | ICD-10-CM

## 2014-10-09 DIAGNOSIS — M5417 Radiculopathy, lumbosacral region: Secondary | ICD-10-CM | POA: Diagnosis not present

## 2014-10-09 DIAGNOSIS — M5136 Other intervertebral disc degeneration, lumbar region: Secondary | ICD-10-CM | POA: Diagnosis not present

## 2014-10-09 DIAGNOSIS — M9905 Segmental and somatic dysfunction of pelvic region: Secondary | ICD-10-CM | POA: Diagnosis not present

## 2014-10-09 DIAGNOSIS — M9903 Segmental and somatic dysfunction of lumbar region: Secondary | ICD-10-CM | POA: Diagnosis not present

## 2014-10-11 DIAGNOSIS — M9903 Segmental and somatic dysfunction of lumbar region: Secondary | ICD-10-CM | POA: Diagnosis not present

## 2014-10-11 DIAGNOSIS — M5417 Radiculopathy, lumbosacral region: Secondary | ICD-10-CM | POA: Diagnosis not present

## 2014-10-11 DIAGNOSIS — M5136 Other intervertebral disc degeneration, lumbar region: Secondary | ICD-10-CM | POA: Diagnosis not present

## 2014-10-11 DIAGNOSIS — M9905 Segmental and somatic dysfunction of pelvic region: Secondary | ICD-10-CM | POA: Diagnosis not present

## 2014-10-13 ENCOUNTER — Encounter: Payer: Self-pay | Admitting: Family Medicine

## 2014-10-13 ENCOUNTER — Ambulatory Visit (INDEPENDENT_AMBULATORY_CARE_PROVIDER_SITE_OTHER): Payer: Medicare Other | Admitting: Family Medicine

## 2014-10-13 VITALS — BP 130/86 | HR 99 | Temp 98.9°F | Resp 18 | Wt 298.8 lb

## 2014-10-13 DIAGNOSIS — M5136 Other intervertebral disc degeneration, lumbar region: Secondary | ICD-10-CM | POA: Diagnosis not present

## 2014-10-13 DIAGNOSIS — M9905 Segmental and somatic dysfunction of pelvic region: Secondary | ICD-10-CM | POA: Diagnosis not present

## 2014-10-13 DIAGNOSIS — J069 Acute upper respiratory infection, unspecified: Secondary | ICD-10-CM | POA: Diagnosis not present

## 2014-10-13 DIAGNOSIS — M9903 Segmental and somatic dysfunction of lumbar region: Secondary | ICD-10-CM | POA: Diagnosis not present

## 2014-10-13 DIAGNOSIS — M5417 Radiculopathy, lumbosacral region: Secondary | ICD-10-CM | POA: Diagnosis not present

## 2014-10-13 NOTE — Progress Notes (Signed)
Subjective:     Patient ID: Christian Berg, male   DOB: [birth year], 64 y.o.   MRN: [id number]  HPI  Chief Complaint  Patient presents with  . Cough    Patient comes in office today with cold like symptoms for the past week. Patient reports having post nasal drip, coughing clear/brown phlegm, sore throat and chest congestion. Patient has tried otc Tylenol and cough syrup  Reports his UTI/ urinary sx improved after being treated with Rocephin on 04/05/14. States sinuses are starting to clear and cough/ chest congestion is primary symptom. Continues on doxycycline. Accompanied by his wife today.    Review of Systems  Constitutional: Positive for fever (low grade @ 99 degrees). Negative for chills.       Objective:   Physical Exam  Constitutional: He appears well-developed and well-nourished. No distress.  Ears: T.M's intact without inflammation Throat: no tonsillar enlargement or exudate Neck: bilateral anterior cervical nodes. Lungs: coarse crackles/wheezes posterior lung fields.     Assessment:    1. Upper respiratory infection    Plan:    Discussed sx treatment. Will continue on doxycycline.

## 2014-10-13 NOTE — Patient Instructions (Signed)
Discussed use of Mucinex and left over hydrocodone cough syrup.Also saline nasal spray and use Claritin and Delsym as needed.

## 2014-10-16 ENCOUNTER — Ambulatory Visit: Admission: RE | Admit: 2014-10-16 | Payer: Medicare Other | Source: Ambulatory Visit

## 2014-10-17 ENCOUNTER — Ambulatory Visit: Payer: Medicare Other | Admitting: Family Medicine

## 2014-10-24 ENCOUNTER — Other Ambulatory Visit: Payer: Self-pay | Admitting: Radiology

## 2014-10-25 ENCOUNTER — Telehealth: Payer: Self-pay | Admitting: Family Medicine

## 2014-10-25 ENCOUNTER — Ambulatory Visit
Admission: RE | Admit: 2014-10-25 | Discharge: 2014-10-25 | Disposition: A | Discharge status: Home / Self Care | Payer: Medicare Other | Source: Ambulatory Visit | Attending: Gastroenterology | Admitting: Gastroenterology

## 2014-10-25 ENCOUNTER — Other Ambulatory Visit: Payer: Self-pay | Admitting: Family Medicine

## 2014-10-25 DIAGNOSIS — K7460 Unspecified cirrhosis of liver: Secondary | ICD-10-CM | POA: Insufficient documentation

## 2014-10-25 DIAGNOSIS — K7581 Nonalcoholic steatohepatitis (NASH): Secondary | ICD-10-CM | POA: Diagnosis not present

## 2014-10-25 LAB — CBC
HCT: 39.4 % — ABNORMAL LOW (ref 40.0–52.0)
Hemoglobin: 13.5 g/dL (ref 13.0–18.0)
MCH: 33.9 pg (ref 26.0–34.0)
MCHC: 34.3 g/dL (ref 32.0–36.0)
MCV: 98.7 fL (ref 80.0–100.0)
Platelets: 145 10*3/uL — ABNORMAL LOW (ref 150–440)
RBC: 3.99 MIL/uL — ABNORMAL LOW (ref 4.40–5.90)
RDW: 13.6 % (ref 11.5–14.5)
WBC: 4.4 10*3/uL (ref 3.8–10.6)

## 2014-10-25 LAB — APTT: aPTT: 33 seconds (ref 24–36)

## 2014-10-25 LAB — PROTIME-INR
INR: 1.32
Prothrombin Time: 16.6 seconds — ABNORMAL HIGH (ref 11.4–15.0)

## 2014-10-25 MED ORDER — SODIUM CHLORIDE 0.9 % IV SOLN
INTRAVENOUS | Status: DC
  Administered 2014-10-25: 08:00:00 via INTRAVENOUS

## 2014-10-25 MED ORDER — HYDROCODONE-ACETAMINOPHEN 5-325 MG PO TABS
1.0000 | ORAL_TABLET | ORAL | Status: DC | PRN
  Filled 2014-10-25: qty 2

## 2014-10-25 MED ORDER — FENTANYL CITRATE (PF) 100 MCG/2ML IJ SOLN
INTRAMUSCULAR | Status: AC | PRN
  Administered 2014-10-25: 50 ug via INTRAVENOUS

## 2014-10-25 MED ORDER — MIDAZOLAM HCL 5 MG/5ML IJ SOLN
INTRAMUSCULAR | Status: AC | PRN
  Administered 2014-10-25: 1 mg via INTRAVENOUS
  Administered 2014-10-25: 0.5 mg via INTRAVENOUS

## 2014-10-25 MED ORDER — MIDAZOLAM HCL 5 MG/5ML IJ SOLN
INTRAMUSCULAR | Status: AC
  Filled 2014-10-25: qty 5

## 2014-10-25 MED ORDER — FENTANYL CITRATE (PF) 100 MCG/2ML IJ SOLN
INTRAMUSCULAR | Status: AC
  Filled 2014-10-25: qty 4

## 2014-10-25 NOTE — Telephone Encounter (Signed)
Patient states that he saw you last week for bronchitis and states that he is no better and is coughing a little more even with the cough medicine.  He only has one more dose.  Patient would like to know if you can call something else in.  He uses [facility].  CB# [phone] jld

## 2014-10-25 NOTE — Telephone Encounter (Signed)
Pt called back b/c his wife went to [facility] but medication hadn't been called in. I advised pt needs an OV. Pt wasn't very happy but he scheduled OV for tomorrow @ 11. Thanks TNP

## 2014-10-25 NOTE — Telephone Encounter (Signed)
I am sorry that I was not available to see him when he came in today. Need to see him for an office visit to reexamine him again tomorrow or with another provider if he can't wait until then.

## 2014-10-25 NOTE — Procedures (Signed)
Discussed procedure and risks with patient and wife. Informed consent obtained. Will perform US-guided liver biopsy.

## 2014-10-25 NOTE — Procedures (Signed)
Under US guidance, 5 core biopsies of right hepatic lobe obtained. No immediate complications.

## 2014-10-26 ENCOUNTER — Encounter: Payer: Self-pay | Admitting: Family Medicine

## 2014-10-26 ENCOUNTER — Ambulatory Visit (INDEPENDENT_AMBULATORY_CARE_PROVIDER_SITE_OTHER): Payer: Medicare Other | Admitting: Family Medicine

## 2014-10-26 VITALS — BP 124/72 | HR 105 | Temp 98.3°F | Resp 16 | Wt 298.2 lb

## 2014-10-26 DIAGNOSIS — J069 Acute upper respiratory infection, unspecified: Secondary | ICD-10-CM

## 2014-10-26 LAB — SURGICAL PATHOLOGY

## 2014-10-26 MED ORDER — HYDROCODONE-HOMATROPINE 5-1.5 MG/5ML PO SYRP: ORAL_SOLUTION | ORAL | Status: DC

## 2014-10-26 NOTE — Progress Notes (Signed)
Subjective:     Patient ID: Christian Berg, male   DOB: 10-25-1950, 64 y.o.   MRN: [id number]  HPI  Chief Complaint  Patient presents with  . URI    Patient presents in office today for follow up visit from 9/16.Last visit patient advised to continue doxycycline, patient reports he still has cough and was taking his wife prescription for Hydrocodone-Homatropine and an otc cough syrup but states that it did not help. Patient also reports today that he has been running a low grade fever of 99  Reports occasional production of brown, chunk-like sputum. Cough has been helped using hydrocodone cough syrup. Reports residual clear sinus congestion. Onset of sx originally on or about 9/9.   Review of Systems  Gastrointestinal:       Liver biopsy yesterday with residual pain.       Objective:   Physical Exam  Constitutional: He appears well-developed and well-nourished. No distress.  Pulmonary/Chest: Effort normal and breath sounds normal. He has no wheezes.       Assessment:    1. Upper respiratory infection: clearing of lung exam from prior visit - HYDROcodone-homatropine (HYCODAN) 5-1.5 MG/5ML syrup; 5 ml 4-6 hours as needed for cough  Dispense: 240 mL; Refill: 0    Plan:    Continue sx treatment. Call if not continuing to improve. Flu shot when better.

## 2014-10-26 NOTE — Patient Instructions (Signed)
Continue cough syrup and expectorant. Call me if not continuing to improve.

## 2014-10-30 ENCOUNTER — Other Ambulatory Visit: Payer: Self-pay | Admitting: Ophthalmology

## 2014-10-30 DIAGNOSIS — H53462 Homonymous bilateral field defects, left side: Secondary | ICD-10-CM | POA: Diagnosis not present

## 2014-10-31 ENCOUNTER — Telehealth: Payer: Self-pay

## 2014-10-31 ENCOUNTER — Telehealth: Payer: Self-pay | Admitting: Family Medicine

## 2014-10-31 ENCOUNTER — Ambulatory Visit (INDEPENDENT_AMBULATORY_CARE_PROVIDER_SITE_OTHER): Payer: Medicare Other | Admitting: Family Medicine

## 2014-10-31 ENCOUNTER — Ambulatory Visit (INDEPENDENT_AMBULATORY_CARE_PROVIDER_SITE_OTHER): Payer: Medicare Other | Admitting: Gastroenterology

## 2014-10-31 ENCOUNTER — Encounter: Payer: Self-pay | Admitting: Gastroenterology

## 2014-10-31 ENCOUNTER — Encounter: Payer: Self-pay | Admitting: Family Medicine

## 2014-10-31 ENCOUNTER — Other Ambulatory Visit: Payer: Self-pay

## 2014-10-31 VITALS — BP 151/83 | HR 98 | Temp 98.3°F | Wt 290.8 lb

## 2014-10-31 VITALS — BP 132/88 | HR 93 | Temp 99.1°F | Resp 16 | Wt 292.0 lb

## 2014-10-31 DIAGNOSIS — N390 Urinary tract infection, site not specified: Secondary | ICD-10-CM

## 2014-10-31 DIAGNOSIS — R319 Hematuria, unspecified: Secondary | ICD-10-CM

## 2014-10-31 DIAGNOSIS — J40 Bronchitis, not specified as acute or chronic: Secondary | ICD-10-CM | POA: Diagnosis not present

## 2014-10-31 DIAGNOSIS — R748 Abnormal levels of other serum enzymes: Secondary | ICD-10-CM | POA: Diagnosis not present

## 2014-10-31 DIAGNOSIS — I4891 Unspecified atrial fibrillation: Secondary | ICD-10-CM | POA: Diagnosis not present

## 2014-10-31 DIAGNOSIS — Z8673 Personal history of transient ischemic attack (TIA), and cerebral infarction without residual deficits: Secondary | ICD-10-CM | POA: Diagnosis not present

## 2014-10-31 LAB — POCT URINALYSIS DIPSTICK
Glucose, UA: 250
Protein, UA: 300
Spec Grav, UA: >=1.03
Urobilinogen, UA: 0.2
pH, UA: 6

## 2014-10-31 LAB — POCT INR: INR: 4

## 2014-10-31 MED ORDER — DOXYCYCLINE HYCLATE 100 MG PO TABS: 100.0000 mg | ORAL_TABLET | Freq: Two times a day (BID) | ORAL | Status: DC

## 2014-10-31 NOTE — Patient Instructions (Signed)
Hold Coumadin for one day then resume at 10 mg. Repeat Protime in 2 weeks. If you don't hear about brain imaging from Dr. Suzette Battiest him.

## 2014-10-31 NOTE — Progress Notes (Signed)
Subjective:     Patient ID: Christian Berg, male   DOB: 08-Nov-1950, 64 y.o.   MRN: [id number]  HPI  Chief Complaint  Patient presents with  . Hematuria    Patient comes in office today with concerns of blood in the urine since yesterday, associated symptoms include back pain  . Cough    Patient is accompanied by wife who has concerns because patient has been coughing up blood since the weekend  Reports increased urinary frequency, low back pain and blood in his urine. Also has had persistent productive cough from prior URI. States he had visual changes this past weekend and feared another TIA. He took an additional dose of Coumadin 7.5 mg. He has since seen ophthalmology and felt to have had a stroke with imaging pending. Hx of A. Fib and carotid stenosis. Accompanied by his wife today.   Review of Systems  Constitutional: Negative for fever and chills.  Genitourinary:       Prior urine culture with E.Coli sensitive to tetracycline.       Objective:   Physical Exam  Constitutional: He appears well-developed and well-nourished. No distress.  Neck: Carotid bruit is not present.  Cardiovascular: An irregularly irregular rhythm present. Tachycardia present.   Pulmonary/Chest: Breath sounds normal.  Musculoskeletal: He exhibits no edema (of lower extremities).       Assessment:    1. Urinary tract infection with hematuria, site unspecified - POCT urinalysis dipstick - Urine culture - doxycycline (VIBRA-TABS) 100 MG tablet; Take 1 tablet (100 mg total) by mouth 2 (two) times daily.  Dispense: 20 tablet; Refill: 0  2. Atrial fibrillation, unspecified type ([place]) - POCT INR  3. Bronchitis - doxycycline (VIBRA-TABS) 100 MG tablet; Take 1 tablet (100 mg total) by mouth 2 (two) times daily.  Dispense: 20 tablet; Refill: 0  4. History of TIA (transient ischemic attack)    Plan:    Hold Coumadin x one day with f/u in 2 weeks. Further f/u with ophthalmology as directed. Consider repeat  carotid dopplers. Will call with urine culture results.

## 2014-10-31 NOTE — Telephone Encounter (Signed)
Pt concern for his health, pt stated that he is having an MRI tomorrow and that he wanted to let [place] know of his concerns said that he would rather talk to [place]. Pt also stated that he wanted to make an appointment for blood in urine and he will talk to [facility] then. Scheduled pt for today at 4:00 pm.  Thanks,

## 2014-10-31 NOTE — Telephone Encounter (Signed)
Pt scheduled to follow up with Dr. Allen Norris today in [place].

## 2014-10-31 NOTE — Progress Notes (Signed)
Primary Care Physician: [name], PA  Primary Gastroenterologist:  Dr. Lucilla Lame  Chief Complaint  Patient presents with  . Follow up liver enzymes    HPI: Christian Berg is a 64 y.o. male here follow-up after liver biopsy. Biopsy showed significant inflammation and significant damage. The biopsy also showed increased iron in the hepatocytes. The patient had an episode of decreased vision on Saturday after having a liver biopsy  Current Outpatient Prescriptions  Medication Sig Dispense Refill  . acetaminophen (TYLENOL) 500 MG tablet Take 1,500 mg by mouth every 6 (six) hours as needed for fever.    [name] aspirin EC 81 MG tablet Take 81 mg by mouth daily.    . fluorouracil (EFUDEX) 5 % cream Apply 1 application topically 2 (two) times daily.    [name] glipiZIDE (GLUCOTROL) 10 MG tablet Take 1 tablet (10 mg total) by mouth 2 (two) times daily before a meal. 60 tablet 3  . HYDROcodone-homatropine (HYCODAN) 5-1.5 MG/5ML syrup 5 ml 4-6 hours as needed for cough 240 mL 0  . lisinopril (PRINIVIL,ZESTRIL) 20 MG tablet Take 20 mg by mouth daily.    [name] lovastatin (MEVACOR) 10 MG tablet Take 2 tablets (20 mg total) by mouth daily. 180 tablet 3  . metFORMIN (GLUCOPHAGE) 850 MG tablet Take 1 tablet (850 mg total) by mouth 2 (two) times daily with a meal. 180 tablet 1  . metoprolol (LOPRESSOR) 100 MG tablet Take 100 mg by mouth 2 (two) times daily.    [name] nystatin cream (MYCOSTATIN) Apply 1 application topically 2 (two) times daily. 30 g 0  . traMADol (ULTRAM) 50 MG tablet Take 50 mg by mouth every 6 (six) hours as needed.    . traMADol-acetaminophen (ULTRACET) 37.5-325 MG per tablet Take 3 tablets by mouth 2 (two) times daily.     [name] warfarin (COUMADIN) 10 MG tablet     . warfarin (COUMADIN) 7.5 MG tablet Take 7.5 mg by mouth daily.     No current facility-administered medications for this visit.    Allergies as of 10/31/2014 - Review Complete 10/31/2014  Allergen Reaction Noted  . Sulfa  antibiotics Hives 05/31/2014    ROS:  General: Negative for anorexia, weight loss, fever, chills, fatigue, weakness. Obese ENT: Negative for hoarseness, difficulty swallowing , nasal congestion. GI: See history of present illness. GU:  Negative for dysuria, hematuria, urinary incontinence, urinary frequency, nocturnal urination.  Endo: Negative for unusual weight change.    Physical Examination:   BP 151/83 mmHg  Pulse 98  Temp(Src) 98.3 F (36.8 C) (Oral)  Wt 290 lb 12.8 oz (131.906 kg)  General: Well-nourished, well-developed in no acute distress.  Eyes: No icterus. Conjunctivae pink. Mouth: Oropharyngeal mucosa moist and pink , no lesions erythema or exudate. Extremities: No lower extremity edema. No clubbing or deformities. Neuro: Alert and oriented x 3.  Grossly intact. Skin: Warm and dry, no jaundice.   Psych: Alert and cooperative, normal mood and affect.  Labs:    Imaging Studies: US Biopsy  10/25/2014   CLINICAL DATA:  Cirrhosis of liver without ascites, unspecified hepatic cirrhosis type.  EXAM: ULTRASOUND GUIDED core BIOPSY OF right hepatic lobe.  MEDICATIONS: 1.0 mg IV Versed; 50 mcg IV Fentanyl  Total Moderate Sedation Time: 15 minutes.  PROCEDURE: The procedure, risks, benefits, and alternatives were explained to the patient. Questions regarding the procedure were encouraged and answered. The patient understands and consents to the procedure.  The right mid-axillary line region was prepped with chlorhexidine in  a sterile fashion, and a sterile drape was applied covering the operative field. Sterile gloves were used for the procedure. Local anesthesia was provided with 1% Lidocaine.  Under ultrasound guidance, 17 gauge guiding needle was directed into right hepatic lobe. Five core samples were obtained using 18 gauge biopsy needle. Needle was removed with the simultaneous injection of Gel-Foam slurry. Appropriate dressing was applied.  COMPLICATIONS: None immediate.   FINDINGS: None.  IMPRESSION: Under ultrasound guidance, percutaneous biopsy of right hepatic lobe for evaluation of hepatic cirrhosis.   Electronically Signed   By: Marijo Conception, M.D.   On: 10/25/2014 09:41    Assessment and Plan:   Christian Berg is a 64 y.o. y/o male who comes in for follow-up after having a liver biopsy. The patient's liver biopsy showed significant inflammation and significant damage. The patient also had a episode of decreased vision and is being followed for that. The patient has been told that the biopsies was consistent with severe steatohepatitis and that he would need to lose weight. The patient has been given options for weight loss and has been told that if he cannot lose weight over the next 3 months he should contact me for possible bariatric surgery. His wife have explained the plan and agree with it.   Note: This dictation was prepared with Dragon dictation along with smaller phrase technology. Any transcriptional errors that result from this process are unintentional.

## 2014-10-31 NOTE — Telephone Encounter (Signed)
Pt called saying he needed to let you know he has been having multiple health issues since his liver biopsy.  When he stopped his blood thinner right after his surgery he ended up having a mini stroke, causing some loss of vision and bad headaches.  He is still in a lot of pain from surgery site.  He has an MRI scheduled tomorrow.   He would like for you to call him back and let you know everything that is going on.  Please call [phone]  [facility]

## 2014-10-31 NOTE — Telephone Encounter (Signed)
Patient called stating that he had a liver biopsy done last week and was wanting results. He also stated that he wanted to be seen by Dr. Allen Norris since he continues to have pain on his left side of his abdomen. He stated that he had a mini stroke this past Saturday 10/28/2014 since he had to stop his blood thinner medication for the procedure. He stated that he restarted his blood thinner as soon as he had the liver biopsy. In addition, he has lost part of his vision. He has seen his PCP for this but would like to see Dr. Allen Norris as soon as possible to know what will be his next step. Please give him a call once you schedule his appointment. Thank you.

## 2014-11-01 ENCOUNTER — Ambulatory Visit
Admission: RE | Admit: 2014-11-01 | Discharge: 2014-11-01 | Disposition: A | Discharge status: Home / Self Care | Payer: Medicare Other | Source: Ambulatory Visit | Attending: Ophthalmology | Admitting: Ophthalmology

## 2014-11-01 ENCOUNTER — Telehealth: Payer: Self-pay

## 2014-11-01 LAB — HEPATITIS C ANTIBODY: Hep C Virus Ab: <0.1 s/co ratio (ref 0.0–0.9)

## 2014-11-01 NOTE — Telephone Encounter (Signed)
-----   Message from Lucilla Lame, MD sent at 11/01/2014 10:58 AM EDT ----- Let the patient know the HCV was negative.

## 2014-11-02 ENCOUNTER — Other Ambulatory Visit: Payer: Self-pay | Admitting: Ophthalmology

## 2014-11-02 DIAGNOSIS — H53462 Homonymous bilateral field defects, left side: Secondary | ICD-10-CM

## 2014-11-02 NOTE — Telephone Encounter (Signed)
Please have him call Dr. Wallace Going first as he initiated the MRI and can decide about which would be the best alternative scan to perform.

## 2014-11-02 NOTE — Telephone Encounter (Signed)
Advised pt as directed. Pt verbalized fully understanding.  Thanks,

## 2014-11-02 NOTE — Telephone Encounter (Signed)
Pt states [facility] didn't schedule him a MRI due to him having a device in his chest.  He stated Mikki Santee told to call back within 24hrs if he was unable to get MRI scheduled and we would schedule that for him. Please BC# [phone]

## 2014-11-06 ENCOUNTER — Ambulatory Visit
Admission: RE | Admit: 2014-11-06 | Discharge: 2014-11-06 | Disposition: A | Discharge status: Home / Self Care | Payer: Medicare Other | Source: Ambulatory Visit | Attending: Ophthalmology | Admitting: Ophthalmology

## 2014-11-06 DIAGNOSIS — H53462 Homonymous bilateral field defects, left side: Secondary | ICD-10-CM | POA: Insufficient documentation

## 2014-11-06 DIAGNOSIS — I639 Cerebral infarction, unspecified: Secondary | ICD-10-CM | POA: Diagnosis not present

## 2014-11-06 MED ORDER — IOHEXOL 300 MG/ML  SOLN
75.0000 mL | Freq: Once | INTRAMUSCULAR | Status: AC | PRN
  Administered 2014-11-06: 75 mL via INTRAVENOUS

## 2014-11-07 LAB — POCT I-STAT CREATININE: Creatinine, Ser: 1 mg/dL (ref 0.61–1.24)

## 2014-11-11 ENCOUNTER — Inpatient Hospital Stay
Admission: EM | Admit: 2014-11-11 | Discharge: 2014-11-12 | DRG: 439 | Disposition: A | Discharge status: Home / Self Care | Payer: Medicare Other | Attending: Internal Medicine | Admitting: Internal Medicine

## 2014-11-11 ENCOUNTER — Emergency Department: Payer: Medicare Other

## 2014-11-11 ENCOUNTER — Encounter: Payer: Self-pay | Admitting: *Deleted

## 2014-11-11 DIAGNOSIS — I5032 Chronic diastolic (congestive) heart failure: Secondary | ICD-10-CM | POA: Diagnosis not present

## 2014-11-11 DIAGNOSIS — I110 Hypertensive heart disease with heart failure: Secondary | ICD-10-CM | POA: Diagnosis present

## 2014-11-11 DIAGNOSIS — Z7982 Long term (current) use of aspirin: Secondary | ICD-10-CM

## 2014-11-11 DIAGNOSIS — E119 Type 2 diabetes mellitus without complications: Secondary | ICD-10-CM | POA: Diagnosis not present

## 2014-11-11 DIAGNOSIS — Z8249 Family history of ischemic heart disease and other diseases of the circulatory system: Secondary | ICD-10-CM

## 2014-11-11 DIAGNOSIS — N200 Calculus of kidney: Secondary | ICD-10-CM | POA: Diagnosis not present

## 2014-11-11 DIAGNOSIS — Z7984 Long term (current) use of oral hypoglycemic drugs: Secondary | ICD-10-CM | POA: Diagnosis not present

## 2014-11-11 DIAGNOSIS — N481 Balanitis: Secondary | ICD-10-CM | POA: Diagnosis present

## 2014-11-11 DIAGNOSIS — K859 Acute pancreatitis without necrosis or infection, unspecified: Secondary | ICD-10-CM | POA: Diagnosis present

## 2014-11-11 DIAGNOSIS — N132 Hydronephrosis with renal and ureteral calculous obstruction: Secondary | ICD-10-CM | POA: Diagnosis not present

## 2014-11-11 DIAGNOSIS — R05 Cough: Secondary | ICD-10-CM | POA: Diagnosis not present

## 2014-11-11 DIAGNOSIS — E1165 Type 2 diabetes mellitus with hyperglycemia: Secondary | ICD-10-CM | POA: Diagnosis present

## 2014-11-11 DIAGNOSIS — Z950 Presence of cardiac pacemaker: Secondary | ICD-10-CM | POA: Diagnosis not present

## 2014-11-11 DIAGNOSIS — I10 Essential (primary) hypertension: Secondary | ICD-10-CM | POA: Diagnosis present

## 2014-11-11 DIAGNOSIS — I482 Chronic atrial fibrillation: Secondary | ICD-10-CM | POA: Diagnosis not present

## 2014-11-11 DIAGNOSIS — I739 Peripheral vascular disease, unspecified: Secondary | ICD-10-CM | POA: Diagnosis present

## 2014-11-11 DIAGNOSIS — E785 Hyperlipidemia, unspecified: Secondary | ICD-10-CM | POA: Diagnosis present

## 2014-11-11 DIAGNOSIS — I4891 Unspecified atrial fibrillation: Secondary | ICD-10-CM | POA: Diagnosis present

## 2014-11-11 DIAGNOSIS — E7800 Pure hypercholesterolemia, unspecified: Secondary | ICD-10-CM | POA: Diagnosis present

## 2014-11-11 DIAGNOSIS — Z9049 Acquired absence of other specified parts of digestive tract: Secondary | ICD-10-CM | POA: Diagnosis not present

## 2014-11-11 DIAGNOSIS — Z8673 Personal history of transient ischemic attack (TIA), and cerebral infarction without residual deficits: Secondary | ICD-10-CM

## 2014-11-11 DIAGNOSIS — I5030 Unspecified diastolic (congestive) heart failure: Secondary | ICD-10-CM | POA: Diagnosis present

## 2014-11-11 HISTORY — DX: Unspecified atrial fibrillation: I48.91

## 2014-11-11 LAB — URINALYSIS COMPLETE WITH MICROSCOPIC (ARMC ONLY)
Bacteria, UA: NONE SEEN
Bilirubin Urine: NEGATIVE
Glucose, UA: NEGATIVE mg/dL
Hgb urine dipstick: NEGATIVE
Ketones, ur: NEGATIVE mg/dL
Nitrite: NEGATIVE
Protein, ur: NEGATIVE mg/dL
Specific Gravity, Urine: 1.006 (ref 1.005–1.030)
pH: 5 (ref 5.0–8.0)

## 2014-11-11 LAB — CBC WITH DIFFERENTIAL/PLATELET
Basophils Absolute: 0 10*3/uL (ref 0–0.1)
Basophils Relative: 0 %
Eosinophils Absolute: 0.2 10*3/uL (ref 0–0.7)
Eosinophils Relative: 2 %
HCT: 42.2 % (ref 40.0–52.0)
Hemoglobin: 14.4 g/dL (ref 13.0–18.0)
Lymphocytes Relative: 12 %
Lymphs Abs: 1.3 10*3/uL (ref 1.0–3.6)
MCH: 33.5 pg (ref 26.0–34.0)
MCHC: 34 g/dL (ref 32.0–36.0)
MCV: 98.3 fL (ref 80.0–100.0)
Monocytes Absolute: 0.5 10*3/uL (ref 0.2–1.0)
Monocytes Relative: 5 %
Neutro Abs: 8.7 10*3/uL — ABNORMAL HIGH (ref 1.4–6.5)
Neutrophils Relative %: 81 %
Platelets: 190 10*3/uL (ref 150–440)
RBC: 4.29 MIL/uL — ABNORMAL LOW (ref 4.40–5.90)
RDW: 14 % (ref 11.5–14.5)
WBC: 10.8 10*3/uL — ABNORMAL HIGH (ref 3.8–10.6)

## 2014-11-11 LAB — COMPREHENSIVE METABOLIC PANEL
ALT: 21 U/L (ref 17–63)
AST: 47 U/L — ABNORMAL HIGH (ref 15–41)
Albumin: 3.2 g/dL — ABNORMAL LOW (ref 3.5–5.0)
Alkaline Phosphatase: 63 U/L (ref 38–126)
Anion gap: 7 (ref 5–15)
BUN: 9 mg/dL (ref 6–20)
CO2: 24 mmol/L (ref 22–32)
Calcium: 8.6 mg/dL — ABNORMAL LOW (ref 8.9–10.3)
Chloride: 106 mmol/L (ref 101–111)
Creatinine, Ser: 0.88 mg/dL (ref 0.61–1.24)
GFR calc Af Amer: >60 mL/min (ref >60)
GFR calc non Af Amer: >60 mL/min (ref >60)
Glucose, Bld: 70 mg/dL (ref 65–99)
Potassium: 3.3 mmol/L — ABNORMAL LOW (ref 3.5–5.1)
Sodium: 137 mmol/L (ref 135–145)
Total Bilirubin: 1.4 mg/dL — ABNORMAL HIGH (ref 0.3–1.2)
Total Protein: 7.1 g/dL (ref 6.5–8.1)

## 2014-11-11 LAB — LIPASE, BLOOD: Lipase: 277 U/L — ABNORMAL HIGH (ref 22–51)

## 2014-11-11 LAB — PROTIME-INR
INR: 4.23
Prothrombin Time: 40.6 seconds — ABNORMAL HIGH (ref 11.4–15.0)

## 2014-11-11 LAB — GLUCOSE, CAPILLARY: Glucose-Capillary: 76 mg/dL (ref 65–99)

## 2014-11-11 LAB — TROPONIN I: Troponin I: 0.03 ng/mL (ref <0.031)

## 2014-11-11 MED ORDER — ASPIRIN EC 81 MG PO TBEC
81.0000 mg | DELAYED_RELEASE_TABLET | Freq: Every day | ORAL | Status: DC
  Administered 2014-11-11: 81 mg via ORAL
  Filled 2014-11-11: qty 1

## 2014-11-11 MED ORDER — INSULIN ASPART 100 UNIT/ML ~~LOC~~ SOLN: 0.0000 [IU] | Freq: Four times a day (QID) | SUBCUTANEOUS | Status: DC

## 2014-11-11 MED ORDER — SODIUM CHLORIDE 0.9 % IJ SOLN: 3.0000 mL | Freq: Two times a day (BID) | INTRAMUSCULAR | Status: DC

## 2014-11-11 MED ORDER — INSULIN ASPART 100 UNIT/ML ~~LOC~~ SOLN
0.0000 [IU] | Freq: Every day | SUBCUTANEOUS | Status: DC
Start: 2014-11-11 — End: 2014-11-11

## 2014-11-11 MED ORDER — PRAVASTATIN SODIUM 10 MG PO TABS: 10.0000 mg | ORAL_TABLET | Freq: Every day | ORAL | Status: DC

## 2014-11-11 MED ORDER — HYDROMORPHONE HCL 1 MG/ML IJ SOLN: 0.5000 mg | INTRAMUSCULAR | Status: DC | PRN

## 2014-11-11 MED ORDER — INSULIN ASPART 100 UNIT/ML ~~LOC~~ SOLN: 0.0000 [IU] | Freq: Three times a day (TID) | SUBCUTANEOUS | Status: DC

## 2014-11-11 MED ORDER — IOHEXOL 240 MG/ML SOLN
25.0000 mL | Freq: Once | INTRAMUSCULAR | Status: AC | PRN
  Administered 2014-11-11: 25 mL via ORAL

## 2014-11-11 MED ORDER — SODIUM CHLORIDE 0.9 % IV BOLUS (SEPSIS)
1000.0000 mL | Freq: Once | INTRAVENOUS | Status: AC
  Administered 2014-11-11: 1000 mL via INTRAVENOUS

## 2014-11-11 MED ORDER — HYDROMORPHONE HCL 1 MG/ML IJ SOLN
0.5000 mg | Freq: Once | INTRAMUSCULAR | Status: DC
  Filled 2014-11-11: qty 1

## 2014-11-11 MED ORDER — SODIUM CHLORIDE 0.9 % IV SOLN
INTRAVENOUS | Status: AC
  Administered 2014-11-11: via INTRAVENOUS

## 2014-11-11 MED ORDER — METOPROLOL TARTRATE 100 MG PO TABS
100.0000 mg | ORAL_TABLET | Freq: Two times a day (BID) | ORAL | Status: DC
  Administered 2014-11-11 – 2014-11-12 (×2): 100 mg via ORAL
  Filled 2014-11-11 (×2): qty 1

## 2014-11-11 MED ORDER — LISINOPRIL 20 MG PO TABS
20.0000 mg | ORAL_TABLET | Freq: Every day | ORAL | Status: DC
  Administered 2014-11-12: 20 mg via ORAL
  Filled 2014-11-11: qty 1

## 2014-11-11 MED ORDER — IOHEXOL 300 MG/ML  SOLN
125.0000 mL | Freq: Once | INTRAMUSCULAR | Status: AC | PRN
  Administered 2014-11-11: 125 mL via INTRAVENOUS

## 2014-11-11 MED ORDER — ONDANSETRON HCL 4 MG/2ML IJ SOLN: 4.0000 mg | Freq: Four times a day (QID) | INTRAMUSCULAR | Status: DC | PRN

## 2014-11-11 MED ORDER — ONDANSETRON HCL 4 MG PO TABS: 4.0000 mg | ORAL_TABLET | Freq: Four times a day (QID) | ORAL | Status: DC | PRN

## 2014-11-11 NOTE — ED Notes (Signed)
Assumed care of patient. Wife at bedside. Patient declines pain medication at this time. Resting quietly. Warm blankets given.

## 2014-11-11 NOTE — H&P (Signed)
[facility] NAME: Christian Berg    MR#:  [id number]  DATE OF BIRTH:  1950-05-12  DATE OF ADMISSION:  11/11/2014  PRIMARY CARE PHYSICIAN: [name], PA   REQUESTING/REFERRING PHYSICIAN: Joni Fears, MD  CHIEF COMPLAINT:   Chief Complaint  Patient presents with  . Weakness    HISTORY OF PRESENT ILLNESS:  Christian Berg  is a 64 y.o. male who presents with fever, chills, and 2 episodes of diaphoresis and malaise. Patient is unable to describe his symptoms any better than that. He denies any episodes of pain anywhere. He simply states that last night he started having fevers and chills intermittently. Today at one point he was walking his bathroom and felt like he was losing his balance and was about to pass out. He states that this is similar to how he felt several months ago when he was hospitalized with bacteremia. So he decided to come in to the ED for evaluation. Initial labs in the ED are largely unremarkable except for an elevated lipase. He also has an elevated INR, he is on chronic warfarin for chronic A. fib. INR is 4.23. CT abdomen and pelvis did not show any gallstones or dilated biliary duct, it did however show a kidney stone at the UV junction. Hospitals were called for admission for pancreatitis. CT abdomen and pelvis resulted after initial admission orders placed.  PAST MEDICAL HISTORY:   Past Medical History  Diagnosis Date  . Hypertension   . Presence of permanent cardiac pacemaker     Inactive  . CHF (congestive heart failure) ([place])   . Peripheral vascular disease ([place])     Carotid stenosis  . TIA (transient ischemic attack) 2011    No deficits  . Hypercholesteremia   . Kidney stones   . Degenerative disc disease, lumbar     s/p injury  . Diabetes mellitus without complication ([place])   . Disseminated superficial actinic porokeratosis   . Dysrhythmia     A-FIB, palpatations  . A-fib [facility])     PAST  SURGICAL HISTORY:   Past Surgical History  Procedure Laterality Date  . Appendectomy  2004  . Cardiac catheterization    . Cardiac pacemaker placement  2005  . Cataract extraction w/phaco Right 06/14/2014    Procedure: CATARACT EXTRACTION PHACO AND INTRAOCULAR LENS PLACEMENT (IOC);  Surgeon: Leandrew Koyanagi, MD;  Location: [place];  Service: Ophthalmology;  Laterality: Right;  . Cystoscopy w/ retrogrades Bilateral 08/09/2014    Procedure: CYSTOSCOPY WITH RETROGRADE PYELOGRAM;  Surgeon: Hollice Espy, MD;  Location: [facility];  Service: Urology;  Laterality: Bilateral;    SOCIAL HISTORY:   Social History  Substance Use Topics  . Smoking status: Never Smoker   . Smokeless tobacco: Never Used  . Alcohol Use: No    FAMILY HISTORY:   Family History  Problem Relation Age of Onset  . Adopted: Yes  . Heart disease Mother   . Fibromyalgia Sister   . Hypertension Daughter   . Migraines Daughter   . Kidney disease Neg Hx   . Prostate cancer Neg Hx   . Bladder Cancer Neg Hx     DRUG ALLERGIES:   Allergies  Allergen Reactions  . Sulfa Antibiotics Hives    MEDICATIONS AT HOME:   Prior to Admission medications   Medication Sig Start Date End Date Taking? Authorizing Provider  aspirin EC 81 MG tablet Take 81 mg by mouth at bedtime.  Yes Historical Provider, MD  doxycycline (VIBRA-TABS) 100 MG tablet Take 1 tablet (100 mg total) by mouth 2 (two) times daily. 10/31/14  Yes [name], PA  fluorouracil (EFUDEX) 5 % cream Apply 1 application topically once a week.    Yes Historical Provider, MD  glipiZIDE (GLUCOTROL) 10 MG tablet Take 1 tablet (10 mg total) by mouth 2 (two) times daily before a meal. 08/11/14  Yes [name], PA  lisinopril (PRINIVIL,ZESTRIL) 20 MG tablet Take 20 mg by mouth daily.   Yes Historical Provider, MD  lovastatin (MEVACOR) 10 MG tablet Take 2 tablets (20 mg total) by mouth daily. 07/05/14  Yes [name], PA  metFORMIN (GLUCOPHAGE)  850 MG tablet Take 1 tablet (850 mg total) by mouth 2 (two) times daily with a meal. 07/07/14  Yes [name], PA  metoprolol (LOPRESSOR) 100 MG tablet Take 100 mg by mouth 2 (two) times daily.   Yes Historical Provider, MD  nystatin cream (MYCOSTATIN) Apply 1 application topically 2 (two) times daily. 07/17/14  Yes Shannon A McGowan, PA-C  traMADol-acetaminophen (ULTRACET) 37.5-325 MG per tablet Take 2-3 tablets by mouth See admin instructions. Take 3 tablets orally in the morning. Take 2 tablets orally once a day at bedtime.   Yes Historical Provider, MD  warfarin (COUMADIN) 10 MG tablet Take 10 mg by mouth every morning.  07/05/14  Yes Historical Provider, MD    REVIEW OF SYSTEMS:  Review of Systems  Constitutional: Positive for fever, chills and malaise/fatigue. Negative for weight loss.  HENT: Negative for ear pain, hearing loss and tinnitus.   Eyes: Negative for blurred vision, double vision, pain and redness.  Respiratory: Negative for cough, hemoptysis and shortness of breath.   Cardiovascular: Negative for chest pain, palpitations, orthopnea and leg swelling.  Gastrointestinal: Positive for nausea. Negative for vomiting, abdominal pain, diarrhea and constipation.  Genitourinary: Negative for dysuria, frequency and hematuria.  Musculoskeletal: Negative for back pain, joint pain and neck pain.  Skin:       No acne, rash, or lesions  Neurological: Positive for dizziness. Negative for tremors, focal weakness and weakness.  Endo/Heme/Allergies: Negative for polydipsia. Does not bruise/bleed easily.  Psychiatric/Behavioral: Negative for depression. The patient is not nervous/anxious and does not have insomnia.      VITAL SIGNS:   Filed Vitals:   11/11/14 1830 11/11/14 1930 11/11/14 1945 11/11/14 2000  BP: 130/82 111/70  131/84  Pulse: 93 84 104 118  Temp:      TempSrc:      Resp: 22 22 13 31   Height:      Weight:      SpO2: 98% 99% 100% 99%   Wt Readings from Last 3  Encounters:  11/11/14 131.543 kg (290 lb)  10/31/14 132.45 kg (292 lb)  10/31/14 131.906 kg (290 lb 12.8 oz)    PHYSICAL EXAMINATION:  Physical Exam  Vitals reviewed. Constitutional: He is oriented to person, place, and time. He appears well-developed and well-nourished. No distress.  HENT:  Head: Normocephalic and atraumatic.  Mouth/Throat: Oropharynx is clear and moist.  Eyes: Conjunctivae and EOM are normal. Pupils are equal, round, and reactive to light. No scleral icterus.  Neck: Normal range of motion. Neck supple. No JVD present. No thyromegaly present.  Cardiovascular: Intact distal pulses.  Exam reveals no gallop and no friction rub.   No murmur heard. Irregular rhythm, borderline rate controlled  Respiratory: Effort normal and breath sounds normal. No respiratory distress. He has no wheezes. He has no rales.  GI: Soft. Bowel sounds are normal. He exhibits no distension. There is no tenderness.  Musculoskeletal: Normal range of motion. He exhibits no edema.  No arthritis, no gout  Lymphadenopathy:    He has no cervical adenopathy.  Neurological: He is alert and oriented to person, place, and time. No cranial nerve deficit.  No dysarthria, no aphasia  Skin: Skin is warm and dry. No rash noted. No erythema.  Psychiatric: He has a normal mood and affect. His behavior is normal. Judgment and thought content normal.    LABORATORY PANEL:   CBC  Recent Labs Lab 11/11/14 1747  WBC 10.8*  HGB 14.4  HCT 42.2  PLT 190   ------------------------------------------------------------------------------------------------------------------  Chemistries   Recent Labs Lab 11/11/14 1747  NA 137  K 3.3*  CL 106  CO2 24  GLUCOSE 70  BUN 9  CREATININE 0.88  CALCIUM 8.6*  AST 47*  ALT 21  ALKPHOS 63  BILITOT 1.4*   ------------------------------------------------------------------------------------------------------------------  Cardiac Enzymes  Recent Labs Lab  11/11/14 1747  TROPONINI 0.03   ------------------------------------------------------------------------------------------------------------------  RADIOLOGY:  Dg Chest 2 View  11/11/2014  CLINICAL DATA:  Coughing, cold sweats, weakness EXAM: CHEST  2 VIEW COMPARISON:  None. FINDINGS: There is no focal parenchymal opacity. There is no pleural effusion or pneumothorax. The heart and mediastinal contours are unremarkable. There is a dual lead cardiac pacemaker. The osseous structures are unremarkable. IMPRESSION: No active cardiopulmonary disease. Electronically Signed   By: Kathreen Devoid   On: 11/11/2014 18:14   Ct Abdomen Pelvis W Contrast  11/11/2014  CLINICAL DATA:  Patient complaining of weakness and chills as well of upper abdominal pain and nausea. No vomiting. History of sepsis 3 months ago. History of an appendectomy. Previous liver biopsy. EXAM: CT ABDOMEN AND PELVIS WITH CONTRAST TECHNIQUE: Multidetector CT imaging of the abdomen and pelvis was performed using the standard protocol following bolus administration of intravenous contrast. CONTRAST:  135m OMNIPAQUE IOHEXOL 300 MG/ML  SOLN COMPARISON:  07/12/2014 FINDINGS: Lung bases: Minimal dependent subsegmental atelectasis. Heart mildly enlarged. Liver: Extensive fatty infiltration.  No mass or focal lesion. Spleen, gallbladder, pancreas, adrenal glands:  Unremarkable. Kidneys, ureters, bladder: 3 mm stone at the left ureterovesicular junction. Mild left hydronephrosis. No right hydronephrosis. Normal right ureter. Mild renal cortical thinning. No renal masses. Bladder otherwise unremarkable. Lymph nodes:  No pathologically enlarged lymph nodes. Ascites: None. Gastrointestinal:  Unremarkable.  Status post appendectomy. Musculoskeletal: Degenerative changes throughout the visualized spine. No osteoblastic or osteolytic lesions. IMPRESSION: 1. 3 mm stone at the left ureterovesicular junction causing mild left hydronephrosis. 2. No other acute  findings.  No intrarenal stones. 3. Extensive hepatic steatosis similar to the prior CT. Electronically Signed   By: DLajean ManesM.D.   On: 11/11/2014 20:57    EKG:   Orders placed or performed during the hospital encounter of 11/11/14  . ED EKG  . ED EKG    IMPRESSION AND PLAN:  Principal Problem:   Pancreatitis - patient has not had any abdominal pain. He has an elevated lipase. He has had significant nausea. Pancreatitis initially felt to be the cause of his symptoms, though odd with a total lack of abdominal pain. Subsequently resulting CT scan showed passing kidney stone. This is probably more likely cause of his symptoms. However, with his elevated lipase will treat for pancreatitis as well. Nothing by mouth tonight, pain control, nausea control. Advance diet as tolerated starting tomorrow. Active Problems:   Kidney stone - 3 mm stone and  UV junction. Likely nearly completely passed. Will strain urine to try and capture stone. Likely will only need outpatient urology follow-up, though could consider inpatient urology follow-up if his symptoms return. On CT abdomen and pelvis there were no other stones noted.   Chronic diastolic heart failure (HCC) - continue home meds for this   Type II diabetes mellitus, uncontrolled (HCC) - sliding scale insulin with appropriate fingerstick glucose checks every 6 hours for now while nothing by mouth, to be changed to before meals at bedtime when tolerating a diet, will need heart healthy/carb modified diet.   A-fib (HCC) - chronic A. fib, on Coumadin with supratherapeutic INR. We'll hold his Coumadin dose tonight, check his INR in the morning and resume once he approaches therapeutic level. Pharmacy consult ordered for assistance with this.   Benign essential HTN - stable, continue home medications   Hypercholesteremia - continue home anti-lipids  All the records are reviewed and case discussed with ED provider. Management plans discussed with the  patient and/or family.  DVT PROPHYLAXIS: Systemic anticoagulation  ADMISSION STATUS: Inpatient  CODE STATUS: Full  TOTAL TIME TAKING CARE OF THIS PATIENT: 50 minutes.    Joachim Carton [place] 11/11/2014, 9:48 PM  [facility]  Office  [phone]  CC: Primary care physician; [name], PA

## 2014-11-11 NOTE — Progress Notes (Signed)
ANTICOAGULATION CONSULT NOTE - Initial Consult  Pharmacy Consult for warfarin Indication: atrial fibrillation  Allergies  Allergen Reactions  . Sulfa Antibiotics Hives    Patient Measurements: Height: 5' 8"  (172.7 cm) Weight: 290 lb (131.543 kg) IBW/kg (Calculated) : 68.4 Heparin Dosing Weight: 99.3 kg  Vital Signs: Temp: 98.6 F (37 C) (10/15 2306) Temp Source: Oral (10/15 2306) BP: 147/92 mmHg (10/15 2306) Pulse Rate: 109 (10/15 2306)  Labs:  Recent Labs  11/11/14 1747  HGB 14.4  HCT 42.2  PLT 190  LABPROT 40.6*  INR 4.23*  CREATININE 0.88  TROPONINI 0.03    Estimated Creatinine Clearance: 110.8 mL/min (by C-G formula based on Cr of 0.88).   Medical History: Past Medical History  Diagnosis Date  . Hypertension   . Presence of permanent cardiac pacemaker     Inactive  . CHF (congestive heart failure) ([place])   . Peripheral vascular disease ([place])     Carotid stenosis  . TIA (transient ischemic attack) 2011    No deficits  . Hypercholesteremia   . Kidney stones   . Degenerative disc disease, lumbar     s/p injury  . Diabetes mellitus without complication ([place])   . Disseminated superficial actinic porokeratosis   . Dysrhythmia     A-FIB, palpatations  . A-fib (HCC)     Medications:  Infusions:  . sodium chloride      Assessment: 63 yom cc weakness with fever/chills/diaphoresis/malaise presents with supratherapeutic INR. Admitted for pancreatitis, elevated lipase, CT showed passing stone.   Goal of Therapy:  INR 2-3 Monitor platelets by anticoagulation protocol: Yes   Plan:  Will hold warfarin tonight and tomorrow, recheck INR Monday morning and redose as appropriate.  Laural Benes, Pharm.D.  Clinical Pharmacist 11/11/2014,11:10 PM

## 2014-11-11 NOTE — ED Provider Notes (Signed)
[facility] Emergency Department Provider Note  ____________________________________________  Time seen: 5:25 PM  I have reviewed the triage vital signs and the nursing notes.   HISTORY  Chief Complaint Weakness    HPI Christian Berg is a 64 y.o. male who complains of weakness and chills as well as upper abdominal pain and nausea. No vomiting. Denies chest pain or shortness of breath. Has a history of atrial fibrillation and takes Coumadin for this. No syncope or dizziness. He states that he has a history of sepsis 3 months ago and this feels similar. His recently treated with a 2 week course of doxycycline for a urinary tract infection, and those symptoms do feel better.     Past Medical History  Diagnosis Date  . Hypertension   . Presence of permanent cardiac pacemaker     Inactive  . CHF (congestive heart failure) ([place])   . Peripheral vascular disease ([place])     Carotid stenosis  . TIA (transient ischemic attack) 2011    No deficits  . Hypercholesteremia   . Kidney stones   . Degenerative disc disease, lumbar     s/p injury  . Diabetes mellitus without complication ([place])   . Disseminated superficial actinic porokeratosis   . Dysrhythmia     A-FIB, palpatations  . A-fib [facility])      Patient Active Problem List   Diagnosis Date Noted  . History of TIA (transient ischemic attack) 10/31/2014  . Kidney stones 07/24/2014  . Other cirrhosis of liver ([place]) 07/24/2014  . DSAP (disseminated superficial actinic porokeratosis) 07/17/2014  . Asthma 05/31/2014  . Bilateral cataracts 05/31/2014  . Chronic diastolic heart failure ([place]) 05/31/2014  . Type II diabetes mellitus, uncontrolled ([place]) 05/31/2014  . Actinic porokeratosis 05/31/2014  . Hypercholesteremia 05/31/2014  . Personal history of urinary calculi 05/31/2014  . Adiposity 05/31/2014  . 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 B12 deficiency 05/31/2014  . Carotid artery narrowing  02/15/2014  . 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 Benign prostatic hyperplasia with urinary obstruction 09/06/2012  . Heart disease 05/29/2009  . Benign essential HTN 09/29/2008  . Narrowing of intervertebral disc space 01/11/2007  . Essential (primary) hypertension 12/07/2006  . A-fib ([place]) 12/07/2006     Past Surgical History  Procedure Laterality Date  . Appendectomy  2004  . Cardiac catheterization    . Cardiac pacemaker placement  2005  . Cataract extraction w/phaco Right 06/14/2014    Procedure: CATARACT EXTRACTION PHACO AND INTRAOCULAR LENS PLACEMENT (IOC);  Surgeon: Leandrew Koyanagi, MD;  Location: [place];  Service: Ophthalmology;  Laterality: Right;  . Cystoscopy w/ retrogrades Bilateral 08/09/2014    Procedure: CYSTOSCOPY WITH RETROGRADE PYELOGRAM;  Surgeon: Hollice Espy, MD;  Location: [facility];  Service: Urology;  Laterality: Bilateral;     Current Outpatient Rx  Name  Route  Sig  Dispense  Refill  . acetaminophen (TYLENOL) 500 MG tablet   Oral   Take 1,500 mg by mouth every 6 (six) hours as needed for fever.         [name] aspirin EC 81 MG tablet   Oral   Take 81 mg by mouth daily.         [name] doxycycline (VIBRA-TABS) 100 MG tablet   Oral   Take 1 tablet (100 mg total) by mouth 2 (two) times daily.   20 tablet   0   . fluorouracil (EFUDEX) 5 % cream   Topical   Apply 1 application topically 2 (two)  times daily.         [name] glipiZIDE (GLUCOTROL) 10 MG tablet   Oral   Take 1 tablet (10 mg total) by mouth 2 (two) times daily before a meal.   60 tablet   3   . HYDROcodone-homatropine (HYCODAN) 5-1.5 MG/5ML syrup      5 ml 4-6 hours as needed for cough   240 mL   0   . lisinopril (PRINIVIL,ZESTRIL) 20 MG tablet   Oral   Take 20 mg by mouth daily.         [name] lovastatin (MEVACOR) 10 MG tablet   Oral   Take 2 tablets (20 mg total) by mouth daily.   180 tablet   3   . metFORMIN (GLUCOPHAGE) 850 MG tablet   Oral   Take 1 tablet (850 mg  total) by mouth 2 (two) times daily with a meal.   180 tablet   1   . metoprolol (LOPRESSOR) 100 MG tablet   Oral   Take 100 mg by mouth 2 (two) times daily.         [name] nystatin cream (MYCOSTATIN)   Topical   Apply 1 application topically 2 (two) times daily.   30 g   0   . traMADol (ULTRAM) 50 MG tablet   Oral   Take 50 mg by mouth every 6 (six) hours as needed.         . traMADol-acetaminophen (ULTRACET) 37.5-325 MG per tablet   Oral   Take 3 tablets by mouth 2 (two) times daily.          [name] warfarin (COUMADIN) 10 MG tablet               . warfarin (COUMADIN) 7.5 MG tablet   Oral   Take 7.5 mg by mouth daily.            Allergies Sulfa antibiotics   Family History  Problem Relation Age of Onset  . Adopted: Yes  . Heart disease Mother   . Fibromyalgia Sister   . Hypertension Daughter   . Migraines Daughter   . Kidney disease Neg Hx   . Prostate cancer Neg Hx   . Bladder Cancer Neg Hx     Social History Social History  Substance Use Topics  . Smoking status: Never Smoker   . Smokeless tobacco: Never Used  . Alcohol Use: No    Review of Systems  Constitutional:   No fever positive chills. No weight changes Eyes:   No blurry vision or double vision.  ENT:   No sore throat. Cardiovascular:   No chest pain. Respiratory:   No dyspnea or cough. Gastrointestinal:   Positive upper abdominal pain with nausea. No vomiting or diarrhea..  No BRBPR or melena. Genitourinary:   Negative for dysuria, urinary retention, bloody urine, or difficulty urinating. Musculoskeletal:   Negative for back pain. No joint swelling or pain. Skin:   Negative for rash. Neurological:   Negative for headaches, focal weakness or numbness. Psychiatric:  No anxiety or depression.   Endocrine:  No hot/cold intolerance, changes in energy, or sleep difficulty.  10-point ROS otherwise negative.  ____________________________________________   PHYSICAL EXAM:  VITAL  SIGNS: ED Triage Vitals  Enc Vitals Group     BP 11/11/14 1710 104/64 mmHg     Pulse Rate 11/11/14 1710 101     Resp 11/11/14 1710 18     Temp 11/11/14 1710 97.5 F (36.4 C)  Temp Source 11/11/14 1710 Oral     SpO2 11/11/14 1710 96 %     Weight 11/11/14 1710 290 lb (131.543 kg)     Height 11/11/14 1710 5' 8"  (1.727 m)     Head Cir --      Peak Flow --      Pain Score --      Pain Loc --      Pain Edu? --      Excl. in [place]? --      Constitutional:   Alert and oriented. Low energy, no distress Eyes:   No scleral icterus. No conjunctival pallor. PERRL. EOMI ENT   Head:   Normocephalic and atraumatic.   Nose:   No congestion/rhinnorhea. No septal hematoma   Mouth/Throat:   Dry mucous membranes, no pharyngeal erythema. No peritonsillar mass. No uvula shift.   Neck:   No stridor. No SubQ emphysema. No meningismus. Hematological/Lymphatic/Immunilogical:   No cervical lymphadenopathy. Cardiovascular:   Irregularly irregular rhythm, rate controlled at about 110. Normal and symmetric distal pulses are present in all extremities. No murmurs, rubs, or gallops. Respiratory:   Normal respiratory effort without tachypnea nor retractions. Breath sounds are clear and equal bilaterally. No wheezes/rales/rhonchi. Gastrointestinal:   Soft with epigastric tenderness. No distention. There is no CVA tenderness.  No rebound, rigidity, or guarding. Genitourinary:   deferred Musculoskeletal:   Nontender with normal range of motion in all extremities. No joint effusions.  No lower extremity tenderness.  No edema. Neurologic:   Normal speech and language.  CN 2-10 normal. Motor grossly intact. No pronator drift.  Normal gait. No gross focal neurologic deficits are appreciated.  Skin:    Skin is warm, dry and intact. No rash noted.  No petechiae, purpura, or bullae. Psychiatric:   Mood and affect are normal. Speech and behavior are normal. Patient exhibits appropriate insight and  judgment.  ____________________________________________    LABS (pertinent positives/negatives) (all labs ordered are listed, but only abnormal results are displayed) Labs Reviewed  COMPREHENSIVE METABOLIC PANEL - Abnormal; Notable for the following:    Potassium 3.3 (*)    Calcium 8.6 (*)    Albumin 3.2 (*)    AST 47 (*)    Total Bilirubin 1.4 (*)    All other components within normal limits  LIPASE, BLOOD - Abnormal; Notable for the following:    Lipase 277 (*)    All other components within normal limits  CBC WITH DIFFERENTIAL/PLATELET - Abnormal; Notable for the following:    WBC 10.8 (*)    RBC 4.29 (*)    Neutro Abs 8.7 (*)    All other components within normal limits  URINALYSIS COMPLETEWITH MICROSCOPIC ([facility] ONLY) - Abnormal; Notable for the following:    Color, Urine YELLOW (*)    APPearance CLEAR (*)    Leukocytes, UA 1+ (*)    Squamous Epithelial / LPF 0-5 (*)    All other components within normal limits  TROPONIN I  PROTIME-INR   ____________________________________________   EKG  Interpreted by me Atrial fibrillation with a rate of 98. Normal axis and intervals. Right bundle-branch block. Normal ST segments and T waves.  ____________________________________________    RADIOLOGY  Chest x-ray unremarkable  ____________________________________________   PROCEDURES   ____________________________________________   INITIAL IMPRESSION / ASSESSMENT AND PLAN / ED COURSE  Pertinent labs & imaging results that were available during my care of the patient were reviewed by me and considered in my medical decision making (see chart for details).  Patient presents with upper abdominal pain chills and fatigue. Exam and lab work consistent with pancreatitis. Patient states he's never had anything like this before. Not having any postprandial symptoms recently. We'll proceed with a CT abdomen and pelvis and hospitalization for further evaluation of  this.     ____________________________________________   FINAL CLINICAL IMPRESSION(S) / ED DIAGNOSES  Final diagnoses:  Acute pancreatitis, unspecified pancreatitis type      Carrie Mew, MD 11/11/14 1947

## 2014-11-11 NOTE — ED Notes (Addendum)
Pt states weakness, chills since last night, pt has hx of sepsis and pt states it feels the same, currently on abx for UTI

## 2014-11-12 LAB — BASIC METABOLIC PANEL
Anion gap: 7 (ref 5–15)
BUN: 8 mg/dL (ref 6–20)
CO2: 25 mmol/L (ref 22–32)
Calcium: 8.4 mg/dL — ABNORMAL LOW (ref 8.9–10.3)
Chloride: 109 mmol/L (ref 101–111)
Creatinine, Ser: 0.98 mg/dL (ref 0.61–1.24)
GFR calc Af Amer: >60 mL/min (ref >60)
GFR calc non Af Amer: >60 mL/min (ref >60)
Glucose, Bld: 101 mg/dL — ABNORMAL HIGH (ref 65–99)
Potassium: 3.6 mmol/L (ref 3.5–5.1)
Sodium: 141 mmol/L (ref 135–145)

## 2014-11-12 LAB — CBC
HCT: 42.6 % (ref 40.0–52.0)
Hemoglobin: 14.4 g/dL (ref 13.0–18.0)
MCH: 33.6 pg (ref 26.0–34.0)
MCHC: 33.8 g/dL (ref 32.0–36.0)
MCV: 99.1 fL (ref 80.0–100.0)
Platelets: 182 10*3/uL (ref 150–440)
RBC: 4.29 MIL/uL — ABNORMAL LOW (ref 4.40–5.90)
RDW: 14.1 % (ref 11.5–14.5)
WBC: 7.3 10*3/uL (ref 3.8–10.6)

## 2014-11-12 LAB — PROTIME-INR
INR: 4.24
Prothrombin Time: 40.7 seconds — ABNORMAL HIGH (ref 11.4–15.0)

## 2014-11-12 LAB — GLUCOSE, CAPILLARY: Glucose-Capillary: 90 mg/dL (ref 65–99)

## 2014-11-12 NOTE — Discharge Summary (Signed)
[facility] NAME: Christian Berg    MR#:  [id number]  DATE OF BIRTH:  26-Jul-1950  DATE OF ADMISSION:  11/11/2014 ADMITTING PHYSICIAN: Lance Coon, MD  DATE OF DISCHARGE: 11/12/14  PRIMARY CARE PHYSICIAN: [name], PA    ADMISSION DIAGNOSIS:  Acute pancreatitis, unspecified pancreatitis type [K85.9]  DISCHARGE DIAGNOSIS:  Principal Problem:   Kidney stone  Active Problems:   Chronic diastolic heart failure (HCC)   Type II diabetes mellitus, uncontrolled (HCC)   A-fib (HCC)   Benign essential HTN   Hypercholesteremia   Kidney stone   SECONDARY DIAGNOSIS:   Past Medical History  Diagnosis Date  . Hypertension   . Presence of permanent cardiac pacemaker     Inactive  . CHF (congestive heart failure) ([place])   . Peripheral vascular disease ([place])     Carotid stenosis  . TIA (transient ischemic attack) 2011    No deficits  . Hypercholesteremia   . Kidney stones   . Degenerative disc disease, lumbar     s/p injury  . Diabetes mellitus without complication ([place])   . Disseminated superficial actinic porokeratosis   . Dysrhythmia     A-FIB, palpatations  . A-fib [facility])     HOSPITAL COURSE:  This 64 year old male with a history of kidney stones in nature fibrillation who presented with chills and incidentally found to have very slightly elevated lipase and a CT scan which showed a 3 mm stone at the left UV junction. For further details please refer to H&P.  1. Left UV kidney stone: This was 3 mm and had mild left hydronephrosis. After speaking with the patient appears that the patient knows about this and has had the same kidney stone for 1 year.   2. Subjective fever and chills: Patient was initially thought to have pancreatitis due to very slightly elevated lipase. Patient had no abdominal pain. His CT did not show evidence of pancreatitis. I do not suspect the patient had pancreatitis. He did report that  he had similar subjective findings approximate 3 months ago when he was diagnosed with bacteremia. Thus far his blood and urine cultures are negative. He is afebrile and felt back to his baseline at discharge.   3. Essential hypertension: Patient continue lisinopril and metoprolol  4. Diabetes: Patient continues outpatient medications with ADA diet. I discussed with the patient that he should hold his metformin today since he received IV contrast for CT scan and he may restart this tomorrow.  5.: Hyperlipidemia: Continue lovastatin 6. Atrial fibrillation: Heart rate was controlled. INR is elevated at discharge he was instructed to stop Coumadin for now. He has follow up on Tuesday with his primary care physician and at that time INR should be rechecked in restarted if INR is between 2 and 3.  DISCHARGE CONDITIONS AND DIET:  Vision seeing discharged home in stable condition on a heart healthy/diabetic diet  CONSULTS OBTAINED:     DRUG ALLERGIES:   Allergies  Allergen Reactions  . Sulfa Antibiotics Hives    DISCHARGE MEDICATIONS:   Current Discharge Medication List    CONTINUE these medications which have NOT CHANGED   Details  aspirin EC 81 MG tablet Take 81 mg by mouth at bedtime.     doxycycline (VIBRA-TABS) 100 MG tablet Take 1 tablet (100 mg total) by mouth 2 (two) times daily. Qty: 20 tablet, Refills: 0   Associated Diagnoses: Urinary tract infection with hematuria, site unspecified; Bronchitis  fluorouracil (EFUDEX) 5 % cream Apply 1 application topically once a week.     glipiZIDE (GLUCOTROL) 10 MG tablet Take 1 tablet (10 mg total) by mouth 2 (two) times daily before a meal. Qty: 60 tablet, Refills: 3   Associated Diagnoses: Type II diabetes mellitus, uncontrolled (HCC)    lisinopril (PRINIVIL,ZESTRIL) 20 MG tablet Take 20 mg by mouth daily.    lovastatin (MEVACOR) 10 MG tablet Take 2 tablets (20 mg total) by mouth daily. Qty: 180 tablet, Refills: 3   Associated  Diagnoses: Hyperlipidemia    metFORMIN (GLUCOPHAGE) 850 MG tablet Take 1 tablet (850 mg total) by mouth 2 (two) times daily with a meal. Qty: 180 tablet, Refills: 1   Associated Diagnoses: Type 2 diabetes mellitus without complication (HCC)    metoprolol (LOPRESSOR) 100 MG tablet Take 100 mg by mouth 2 (two) times daily.    nystatin cream (MYCOSTATIN) Apply 1 application topically 2 (two) times daily. Qty: 30 g, Refills: 0   Associated Diagnoses: Balanitis    traMADol-acetaminophen (ULTRACET) 37.5-325 MG per tablet Take 2-3 tablets by mouth See admin instructions. Take 3 tablets orally in the morning. Take 2 tablets orally once a day at bedtime.      STOP taking these medications     warfarin (COUMADIN) 10 MG tablet               Today   CHIEF COMPLAINT:  Patient is doing well this point. Patient reports no abdominal pain, fever or chills.   VITAL SIGNS:  Blood pressure 109/62, pulse 102, temperature 98.6 F (37 C), temperature source Oral, resp. rate 16, height 5' 8"  (1.727 m), weight 127.914 kg (282 lb), SpO2 97 %.   REVIEW OF SYSTEMS:  Review of Systems  Constitutional: Negative for fever, chills and malaise/fatigue.  HENT: Negative for sore throat.   Eyes: Negative for blurred vision.  Respiratory: Negative for cough, hemoptysis, shortness of breath and wheezing.   Cardiovascular: Negative for chest pain, palpitations and leg swelling.  Gastrointestinal: Negative for nausea, vomiting, abdominal pain, diarrhea and blood in stool.  Genitourinary: Negative for dysuria.  Musculoskeletal: Negative for back pain.  Skin:       Skin lesions  Neurological: Negative for dizziness, tremors and headaches.  Endo/Heme/Allergies: Does not bruise/bleed easily.     PHYSICAL EXAMINATION:  GENERAL:  64 y.o.-year-old patient lying in the bed with no acute distress.  NECK:  Supple, no jugular venous distention. No thyroid enlargement, no tenderness.  LUNGS: Normal breath  sounds bilaterally, no wheezing, rales,rhonchi  No use of accessory muscles of respiration.  CARDIOVASCULAR: S1, S2 normal. No murmurs, rubs, or gallops.  ABDOMEN: Soft, non-tender, non-distended. Bowel sounds present. No organomegaly or mass.  EXTREMITIES: No pedal edema, cyanosis, or clubbing.  PSYCHIATRIC: The patient is alert and oriented x 3.  SKIN: He has chronic skin lesions in his lower extremity and upper arms.   DATA REVIEW:   CBC  Recent Labs Lab 11/12/14 0732  WBC 7.3  HGB 14.4  HCT 42.6  PLT 182    Chemistries   Recent Labs Lab 11/11/14 1747 11/12/14 0732  NA 137 141  K 3.3* 3.6  CL 106 109  CO2 24 25  GLUCOSE 70 101*  BUN 9 8  CREATININE 0.88 0.98  CALCIUM 8.6* 8.4*  AST 47*  --   ALT 21  --   ALKPHOS 63  --   BILITOT 1.4*  --     Cardiac Enzymes  Recent Labs Lab  11/11/14 1747  TROPONINI 0.03    Microbiology Results  @[email or web address] @  RADIOLOGY:  Dg Chest 2 View  11/11/2014  CLINICAL DATA:  Coughing, cold sweats, weakness EXAM: CHEST  2 VIEW COMPARISON:  None. FINDINGS: There is no focal parenchymal opacity. There is no pleural effusion or pneumothorax. The heart and mediastinal contours are unremarkable. There is a dual lead cardiac pacemaker. The osseous structures are unremarkable. IMPRESSION: No active cardiopulmonary disease. Electronically Signed   By: Kathreen Devoid   On: 11/11/2014 18:14   Ct Abdomen Pelvis W Contrast  11/11/2014  CLINICAL DATA:  Patient complaining of weakness and chills as well of upper abdominal pain and nausea. No vomiting. History of sepsis 3 months ago. History of an appendectomy. Previous liver biopsy. EXAM: CT ABDOMEN AND PELVIS WITH CONTRAST TECHNIQUE: Multidetector CT imaging of the abdomen and pelvis was performed using the standard protocol following bolus administration of intravenous contrast. CONTRAST:  164m OMNIPAQUE IOHEXOL 300 MG/ML  SOLN COMPARISON:  07/12/2014 FINDINGS: Lung bases: Minimal dependent  subsegmental atelectasis. Heart mildly enlarged. Liver: Extensive fatty infiltration.  No mass or focal lesion. Spleen, gallbladder, pancreas, adrenal glands:  Unremarkable. Kidneys, ureters, bladder: 3 mm stone at the left ureterovesicular junction. Mild left hydronephrosis. No right hydronephrosis. Normal right ureter. Mild renal cortical thinning. No renal masses. Bladder otherwise unremarkable. Lymph nodes:  No pathologically enlarged lymph nodes. Ascites: None. Gastrointestinal:  Unremarkable.  Status post appendectomy. Musculoskeletal: Degenerative changes throughout the visualized spine. No osteoblastic or osteolytic lesions. IMPRESSION: 1. 3 mm stone at the left ureterovesicular junction causing mild left hydronephrosis. 2. No other acute findings.  No intrarenal stones. 3. Extensive hepatic steatosis similar to the prior CT. Electronically Signed   By: DLajean ManesM.D.   On: 11/11/2014 20:57      Management plans discussed with the patient and he is in agreement. Stable for discharge home  Patient should follow up with PCP on Tuesday for INR  CODE STATUS:     Code Status Orders        Start     Ordered   11/11/14 2257  Full code   Continuous     11/11/14 2256      TOTAL TIME TAKING CARE OF THIS PATIENT: 35 minutes.    Note: This dictation was prepared with Dragon dictation along with smaller phrase technology. Any transcriptional errors that result from this process are unintentional.  Kinley Dozier M.D on 11/12/2014 at 11:33 AM  Between 7am to 6pm - Pager - [phone] After 6pm go to [email or web address] - password [email or web address] [facility]  Office  [phone] CC: Primary care physician; RCarmon Ginsberg PA

## 2014-11-12 NOTE — Progress Notes (Signed)
Pt admitted to [facility] from ED. Alert and oriented. VSS. No complaints voiced. Resting quietly at this time.

## 2014-11-12 NOTE — Progress Notes (Signed)
Alert and oriented. VSS. No pain or nausea reported. Resting comfortably. discharged per MD orders. Discharged instructions reviewed with pt and pt verbalized understanding. IV removed per policy. No prescriptions. Discharged via wheelchair escorted by volunteers.

## 2014-11-12 NOTE — Progress Notes (Signed)
Dr. Benjie Karvonen notified of critical INR of 4.24. No new orders.

## 2014-11-13 DIAGNOSIS — H5340 Unspecified visual field defects: Secondary | ICD-10-CM | POA: Diagnosis not present

## 2014-11-13 DIAGNOSIS — I10 Essential (primary) hypertension: Secondary | ICD-10-CM | POA: Diagnosis not present

## 2014-11-13 DIAGNOSIS — I6523 Occlusion and stenosis of bilateral carotid arteries: Secondary | ICD-10-CM | POA: Diagnosis not present

## 2014-11-13 DIAGNOSIS — I639 Cerebral infarction, unspecified: Secondary | ICD-10-CM | POA: Diagnosis not present

## 2014-11-13 DIAGNOSIS — Z6841 Body Mass Index (BMI) 40.0 and over, adult: Secondary | ICD-10-CM | POA: Diagnosis not present

## 2014-11-13 DIAGNOSIS — I482 Chronic atrial fibrillation: Secondary | ICD-10-CM | POA: Diagnosis not present

## 2014-11-13 LAB — URINE CULTURE: Culture: 5000

## 2014-11-14 ENCOUNTER — Encounter: Payer: Self-pay | Admitting: Family Medicine

## 2014-11-14 ENCOUNTER — Ambulatory Visit (INDEPENDENT_AMBULATORY_CARE_PROVIDER_SITE_OTHER): Payer: Medicare Other | Admitting: Family Medicine

## 2014-11-14 VITALS — BP 128/84 | HR 96 | Temp 98.5°F | Resp 16 | Wt 287.6 lb

## 2014-11-14 DIAGNOSIS — I6350 Cerebral infarction due to unspecified occlusion or stenosis of unspecified cerebral artery: Secondary | ICD-10-CM

## 2014-11-14 DIAGNOSIS — Z23 Encounter for immunization: Secondary | ICD-10-CM | POA: Diagnosis not present

## 2014-11-14 DIAGNOSIS — I63539 Cerebral infarction due to unspecified occlusion or stenosis of unspecified posterior cerebral artery: Secondary | ICD-10-CM

## 2014-11-14 DIAGNOSIS — I481 Persistent atrial fibrillation: Secondary | ICD-10-CM | POA: Diagnosis not present

## 2014-11-14 DIAGNOSIS — E119 Type 2 diabetes mellitus without complications: Secondary | ICD-10-CM

## 2014-11-14 DIAGNOSIS — I4819 Other persistent atrial fibrillation: Secondary | ICD-10-CM

## 2014-11-14 LAB — POCT GLYCOSYLATED HEMOGLOBIN (HGB A1C): Hemoglobin A1C: 6.4

## 2014-11-14 LAB — POCT INR
INR: 4.8
Prothrombin Time: 58

## 2014-11-14 MED ORDER — GLIPIZIDE 10 MG PO TABS: 10.0000 mg | ORAL_TABLET | Freq: Two times a day (BID) | ORAL | Status: DC

## 2014-11-14 NOTE — Patient Instructions (Addendum)
Complete evaluation of your stroke with Dr. Manuella Ghazi and Nehemiah Massed. Hold Coumadin for one day then resume at 7.5 mg.daily from left over supply. Repeat pro-time in two weeks.

## 2014-11-14 NOTE — Progress Notes (Signed)
Subjective:     Patient ID: IDAN PRIME, male   DOB: July 14, 1950, 64 y.o.   MRN: [id number]  HPI  Chief Complaint  Patient presents with  . Follow-up    Patient comes in office today for two week follow up to address PT/INR. Patient reports that he was seen at [facility] on 10/15 with complaints of fever and fatigue. Patient states that CT scan showed he had pancreatitis, patient states that he had no pain at visit and when report was reviewed by another physican they did not believe it was enough to show pancreatitis. Patient states neurology appt yesterday confirmed that patient did suffer stroke when he lost his vision around the dates of 9/30-10/1.  Reports he saw neurology,Dr. Manuella Ghazi, yesterday for his right occipital stroke- carotid dopplers are pending. He was noted to have elevated protime during his recent hospital stay and was told to hold Coumadin for one day then resume. States he is back on 10 mg.daily. He has been careful with his diabetic diet has had a 10# weight loss since the summer. Accompanied by his wife today   Review of Systems  Gastrointestinal: Negative for abdominal pain.       Hospital blood cultures negative to date.       Objective:   Physical Exam  Constitutional: He appears well-developed and well-nourished. No distress.       Assessment:    1. Persistent atrial fibrillation (HCC) - POCT INR  2. Type 2 diabetes mellitus without complication, without long-term current use of insulin ([place]); controlled - POCT HgB A1C - glipiZIDE (GLUCOTROL) 10 MG tablet; Take 1 tablet (10 mg total) by mouth 2 (two) times daily before a meal.  Dispense: 60 tablet; Refill: 5  3. Need for influenza vaccination - Flu vaccine HIGH DOSE PF  4. Cerebrovascular accident (CVA) of uncertain pathology involving posterior circulation ([place])     Plan:   Complete stroke evaluation. Hold Coumadin x one day and restart at 7.5 mg. Pro-time in 2 weeks.

## 2014-11-15 ENCOUNTER — Other Ambulatory Visit: Payer: Self-pay | Admitting: Family Medicine

## 2014-11-15 DIAGNOSIS — I482 Chronic atrial fibrillation, unspecified: Secondary | ICD-10-CM

## 2014-11-15 MED ORDER — WARFARIN SODIUM 7.5 MG PO TABS: 7.5000 mg | ORAL_TABLET | Freq: Every day | ORAL | Status: DC

## 2014-11-18 LAB — CULTURE, BLOOD (ROUTINE X 2)
Culture: NO GROWTH
Culture: NO GROWTH

## 2014-11-22 DIAGNOSIS — H5340 Unspecified visual field defects: Secondary | ICD-10-CM | POA: Insufficient documentation

## 2014-11-24 DIAGNOSIS — I6523 Occlusion and stenosis of bilateral carotid arteries: Secondary | ICD-10-CM | POA: Diagnosis not present

## 2014-11-24 DIAGNOSIS — I639 Cerebral infarction, unspecified: Secondary | ICD-10-CM | POA: Diagnosis not present

## 2014-12-01 ENCOUNTER — Ambulatory Visit (INDEPENDENT_AMBULATORY_CARE_PROVIDER_SITE_OTHER): Payer: Medicare Other

## 2014-12-01 DIAGNOSIS — I482 Chronic atrial fibrillation, unspecified: Secondary | ICD-10-CM

## 2014-12-01 LAB — POCT INR
INR: 40.2
PT: 3.3

## 2014-12-01 NOTE — Patient Instructions (Signed)
Hold dose for 1 day (tomorrow) and continue 7.44m daily. Schedule F/U in 2 weeks to monitor medication.

## 2014-12-15 ENCOUNTER — Ambulatory Visit (INDEPENDENT_AMBULATORY_CARE_PROVIDER_SITE_OTHER): Payer: Medicare Other | Admitting: Family Medicine

## 2014-12-15 ENCOUNTER — Encounter: Payer: Self-pay | Admitting: Family Medicine

## 2014-12-15 VITALS — BP 130/80 | HR 80 | Temp 98.7°F | Resp 16 | Wt 292.0 lb

## 2014-12-15 DIAGNOSIS — E119 Type 2 diabetes mellitus without complications: Secondary | ICD-10-CM | POA: Diagnosis not present

## 2014-12-15 DIAGNOSIS — R040 Epistaxis: Secondary | ICD-10-CM

## 2014-12-15 DIAGNOSIS — I481 Persistent atrial fibrillation: Secondary | ICD-10-CM

## 2014-12-15 DIAGNOSIS — I739 Peripheral vascular disease, unspecified: Secondary | ICD-10-CM | POA: Insufficient documentation

## 2014-12-15 DIAGNOSIS — I6350 Cerebral infarction due to unspecified occlusion or stenosis of unspecified cerebral artery: Secondary | ICD-10-CM | POA: Diagnosis not present

## 2014-12-15 DIAGNOSIS — I4819 Other persistent atrial fibrillation: Secondary | ICD-10-CM

## 2014-12-15 LAB — POCT INR
INR: 2
Prothrombin Time: 24.5

## 2014-12-15 MED ORDER — GLIPIZIDE 5 MG PO TABS: 5.0000 mg | ORAL_TABLET | Freq: Two times a day (BID) | ORAL | Status: DC

## 2014-12-15 NOTE — Patient Instructions (Addendum)
Discussed use of nasal decongestant spray right after your nose stops bleeding. Use saline (salt water nasal spray) twice daily or more to keep nasal passages moist and not irritated. Continue humidifier. Continue 7.5 mg. Coumadin daily.

## 2014-12-15 NOTE — Progress Notes (Signed)
Subjective:     Patient ID: Christian Berg, male   DOB: 1950/08/03, 64 y.o.   MRN: [id number]  HPI  Chief Complaint  Patient presents with  . Atrial Fibrillation    Patient is here today for PT/INR check, last visit was 12/01/14. INR was 3.3 and PT 40.2.   [name] Hypoglycemia    Patient would like to address bhypoglycemia incidents over the past 4 weeks. Patient states that he had hard time standing and felt lightheaded. He reports that he has been cutting Metformin in half to help control.   [name] Epistaxis    Patient states that yesterday while at restaurant patient reports heavy nose bleeding from his left nostril that lasted >6mn. Patient applied pressure, tilted head back and applied ice. He states that today his left nostril feels "clogged"  States he has been getting recurrent episodes of bleeding from his left nostril:"this last one was the worst". Denies injury to his nose. Reports electric heat with humidifier. States he will get shaky and jittery at times which he attributes to low sugar. Lowest reading has been 75 during these episodes. Will eat something sweet and his sx improve. A1C in October was 6.4.   Review of Systems     Objective:   Physical Exam  HENT:  Right nostril patent without drainage Left nostril with drying blood in anterior aspect but no active bleeding.       Assessment:    1. Persistent atrial fibrillation (HCC) - POCT INR  2. Type 2 diabetes mellitus without complication, without long-term current use of insulin ([place]: will decrease glipizide. - glipiZIDE (GLUCOTROL) 5 MG tablet; Take 1 tablet (5 mg total) by mouth 2 (two) times daily before a meal.  Dispense: 60 tablet; Refill: 3  3. Epistaxis, recurrent     Plan:    Discussed use of saline nasal spray for humidification and nasal decongestant spray after a bleeding episode. Consider ENT referral. Continue 7.5 mg. Coumadin with f/u in 2 weeks.

## 2014-12-19 ENCOUNTER — Other Ambulatory Visit: Payer: Self-pay | Admitting: Family Medicine

## 2014-12-19 DIAGNOSIS — I10 Essential (primary) hypertension: Secondary | ICD-10-CM

## 2014-12-19 MED ORDER — LISINOPRIL 20 MG PO TABS: 20.0000 mg | ORAL_TABLET | Freq: Every day | ORAL | Status: DC

## 2014-12-29 ENCOUNTER — Ambulatory Visit (INDEPENDENT_AMBULATORY_CARE_PROVIDER_SITE_OTHER): Payer: Medicare Other

## 2014-12-29 DIAGNOSIS — E538 Deficiency of other specified B group vitamins: Secondary | ICD-10-CM | POA: Diagnosis not present

## 2014-12-29 DIAGNOSIS — I482 Chronic atrial fibrillation, unspecified: Secondary | ICD-10-CM

## 2014-12-29 LAB — POCT INR
INR: 1.8
PT: 21.8

## 2014-12-29 MED ORDER — CYANOCOBALAMIN 1000 MCG/ML IJ SOLN: 1000.0000 ug | Freq: Once | INTRAMUSCULAR | Status: DC

## 2014-12-29 NOTE — Patient Instructions (Signed)
Anticoagulation Dose Instructions as of 12/29/2014      Christian Berg Tue Wed Thu Fri Sat   New Dose 7.5 mg 7.5 mg 10 mg 7.5 mg 7.5 mg 7.5 mg 0 mg    Description        7.15m daily except 10 mg on Sat and Tues, f/u 2 weeks

## 2015-01-12 ENCOUNTER — Ambulatory Visit (INDEPENDENT_AMBULATORY_CARE_PROVIDER_SITE_OTHER): Payer: Medicare Other

## 2015-01-12 DIAGNOSIS — I482 Chronic atrial fibrillation, unspecified: Secondary | ICD-10-CM

## 2015-01-12 LAB — POCT INR
INR: 1.8
PT: 22.1

## 2015-01-12 NOTE — Patient Instructions (Signed)
Anticoagulation Dose Instructions as of 01/12/2015      Dorene Grebe Tue Wed Thu Fri Sat   New Dose 7.5 mg 10 mg 7.5 mg 7.5 mg 7.5 mg 10 mg 7.5 mg    Description        7.46m daily except 10 mg on M and F, f/u 2 weeks

## 2015-01-17 ENCOUNTER — Encounter: Payer: Self-pay | Admitting: Family Medicine

## 2015-01-25 DIAGNOSIS — I10 Essential (primary) hypertension: Secondary | ICD-10-CM | POA: Diagnosis not present

## 2015-01-25 DIAGNOSIS — I482 Chronic atrial fibrillation: Secondary | ICD-10-CM | POA: Diagnosis not present

## 2015-01-25 DIAGNOSIS — I6523 Occlusion and stenosis of bilateral carotid arteries: Secondary | ICD-10-CM | POA: Diagnosis not present

## 2015-01-25 DIAGNOSIS — I639 Cerebral infarction, unspecified: Secondary | ICD-10-CM | POA: Diagnosis not present

## 2015-01-25 DIAGNOSIS — Z8673 Personal history of transient ischemic attack (TIA), and cerebral infarction without residual deficits: Secondary | ICD-10-CM | POA: Insufficient documentation

## 2015-01-26 ENCOUNTER — Ambulatory Visit (INDEPENDENT_AMBULATORY_CARE_PROVIDER_SITE_OTHER): Payer: Medicare Other

## 2015-01-26 DIAGNOSIS — I482 Chronic atrial fibrillation, unspecified: Secondary | ICD-10-CM

## 2015-01-26 LAB — POCT INR
INR: 2
PT: 24.3

## 2015-01-26 NOTE — Patient Instructions (Signed)
Anticoagulation Dose Instructions as of 01/26/2015      Dorene Grebe Tue Wed Thu Fri Sat   New Dose 7.5 mg 10 mg 7.5 mg 7.5 mg 7.5 mg 10 mg 7.5 mg    Description        7.60m daily except 10 mg on M and F, f/u 2 weeks

## 2015-01-27 ENCOUNTER — Other Ambulatory Visit: Payer: Self-pay | Admitting: Family Medicine

## 2015-01-30 DIAGNOSIS — H53462 Homonymous bilateral field defects, left side: Secondary | ICD-10-CM | POA: Diagnosis not present

## 2015-02-09 ENCOUNTER — Ambulatory Visit: Payer: Self-pay | Admitting: Urology

## 2015-02-09 ENCOUNTER — Ambulatory Visit (INDEPENDENT_AMBULATORY_CARE_PROVIDER_SITE_OTHER): Payer: Medicare Other

## 2015-02-09 DIAGNOSIS — I482 Chronic atrial fibrillation, unspecified: Secondary | ICD-10-CM

## 2015-02-09 LAB — POCT INR
INR: 2.5
PT: 30.2

## 2015-02-09 NOTE — Patient Instructions (Signed)
Anticoagulation Dose Instructions as of 02/09/2015      Christian Berg Tue Wed Thu Fri Sat   New Dose 7.5 mg 10 mg 7.5 mg 7.5 mg 7.5 mg 10 mg 7.5 mg    Description        7.33m daily except 10 mg on M and F, f/u 4 weeks

## 2015-03-09 ENCOUNTER — Ambulatory Visit (INDEPENDENT_AMBULATORY_CARE_PROVIDER_SITE_OTHER): Payer: Medicare Other | Admitting: Emergency Medicine

## 2015-03-09 DIAGNOSIS — I482 Chronic atrial fibrillation, unspecified: Secondary | ICD-10-CM

## 2015-03-09 NOTE — Patient Instructions (Signed)
Anticoagulation Dose Instructions as of 03/09/2015      Christian Berg Tue Wed Thu Fri Sat   New Dose 7.5 mg 10 mg 7.5 mg 7.5 mg 7.5 mg 10 mg 7.5 mg    Description        7.57m daily except 10 mg on M and F, f/u 8 weeks

## 2015-03-22 ENCOUNTER — Other Ambulatory Visit: Payer: Self-pay | Admitting: Family Medicine

## 2015-03-22 ENCOUNTER — Telehealth: Payer: Self-pay | Admitting: Family Medicine

## 2015-03-22 DIAGNOSIS — M545 Low back pain: Secondary | ICD-10-CM

## 2015-03-22 MED ORDER — TRAMADOL-ACETAMINOPHEN 37.5-325 MG PO TABS: ORAL_TABLET | ORAL | Status: DC

## 2015-04-02 ENCOUNTER — Other Ambulatory Visit: Payer: Self-pay | Admitting: Family Medicine

## 2015-04-06 ENCOUNTER — Other Ambulatory Visit: Payer: Self-pay | Admitting: Family Medicine

## 2015-04-06 DIAGNOSIS — L565 Disseminated superficial actinic porokeratosis (DSAP): Secondary | ICD-10-CM

## 2015-04-06 MED ORDER — FLUOROURACIL 5 % EX CREA: 1.0000 "application " | TOPICAL_CREAM | Freq: Two times a day (BID) | CUTANEOUS | Status: DC

## 2015-04-13 ENCOUNTER — Telehealth: Payer: Self-pay | Admitting: Family Medicine

## 2015-04-13 ENCOUNTER — Other Ambulatory Visit: Payer: Self-pay | Admitting: Family Medicine

## 2015-04-13 DIAGNOSIS — L565 Disseminated superficial actinic porokeratosis (DSAP): Secondary | ICD-10-CM

## 2015-04-13 MED ORDER — FLUOROURACIL 5 % EX CREA: 1.0000 "application " | TOPICAL_CREAM | Freq: Two times a day (BID) | CUTANEOUS | Status: DC

## 2015-04-19 ENCOUNTER — Other Ambulatory Visit: Payer: Self-pay | Admitting: Family Medicine

## 2015-04-30 ENCOUNTER — Other Ambulatory Visit: Payer: Self-pay | Admitting: Family Medicine

## 2015-05-04 ENCOUNTER — Ambulatory Visit (INDEPENDENT_AMBULATORY_CARE_PROVIDER_SITE_OTHER): Payer: Medicare Other

## 2015-05-04 DIAGNOSIS — I482 Chronic atrial fibrillation, unspecified: Secondary | ICD-10-CM

## 2015-05-04 LAB — POCT INR
INR: 2.5
PT: 30.4

## 2015-05-04 NOTE — Patient Instructions (Signed)
Anticoagulation Dose Instructions as of 05/04/2015      Christian Berg Tue Wed Thu Fri Sat   New Dose 7.5 mg 10 mg 7.5 mg 7.5 mg 7.5 mg 10 mg 7.5 mg    Description        7.6m daily except 10 mg on M and F, f/u 8 weeks

## 2015-05-08 ENCOUNTER — Telehealth: Payer: Self-pay | Admitting: Family Medicine

## 2015-05-08 NOTE — Telephone Encounter (Signed)
Letter up front for pickup

## 2015-05-08 NOTE — Telephone Encounter (Signed)
Patient states that he called in and asked you to write him a prescription for Tramadol on 03/22/2015 because he couldn't get in touch with his pain doctor.  Now workers comp has contacted his lawyer wanting documentation stating why you prescribed it and when.  The patient states the reason you prescribed it is because he asked you to because he couldn't get in touch with his pain doctor.

## 2015-05-17 ENCOUNTER — Telehealth: Payer: Self-pay

## 2015-05-17 NOTE — Telephone Encounter (Signed)
Appt has been scheduled for 05/21/15.KW

## 2015-05-17 NOTE — Telephone Encounter (Signed)
-----   Message from [name], [place] sent at 05/16/2015  7:51 AM EDT ----- Please call him and have him schedule a diabetes/cholesterol follow up with me this week or next.

## 2015-05-21 ENCOUNTER — Ambulatory Visit (INDEPENDENT_AMBULATORY_CARE_PROVIDER_SITE_OTHER): Payer: Medicare Other | Admitting: Family Medicine

## 2015-05-21 ENCOUNTER — Encounter: Payer: Self-pay | Admitting: Family Medicine

## 2015-05-21 VITALS — BP 126/84 | HR 70 | Temp 98.5°F | Resp 16 | Wt 297.8 lb

## 2015-05-21 DIAGNOSIS — K5900 Constipation, unspecified: Secondary | ICD-10-CM | POA: Diagnosis not present

## 2015-05-21 DIAGNOSIS — E7800 Pure hypercholesterolemia, unspecified: Secondary | ICD-10-CM | POA: Diagnosis not present

## 2015-05-21 DIAGNOSIS — E119 Type 2 diabetes mellitus without complications: Secondary | ICD-10-CM | POA: Diagnosis not present

## 2015-05-21 DIAGNOSIS — I10 Essential (primary) hypertension: Secondary | ICD-10-CM

## 2015-05-21 DIAGNOSIS — I739 Peripheral vascular disease, unspecified: Secondary | ICD-10-CM | POA: Diagnosis not present

## 2015-05-21 DIAGNOSIS — R5383 Other fatigue: Secondary | ICD-10-CM | POA: Diagnosis not present

## 2015-05-21 MED ORDER — SITAGLIPTIN PHOSPHATE 100 MG PO TABS: 100.0000 mg | ORAL_TABLET | Freq: Every day | ORAL | Status: DC

## 2015-05-21 NOTE — Progress Notes (Signed)
Subjective:     Patient ID: Christian Berg, male   DOB: 05/21/1950, 65 y.o.   MRN: [id number]  HPI  Chief Complaint  Patient presents with  . Diabetes    Patient returns to office for follow up, last office visit was 11/14/14. At last visit HgbA1C was 6.4%, patient was advised to continue Glipizide 86m, patient reports good compliance and tolerance on medication  . Hypertension    Follow up from 07/17/14, blood pressure last visit was 128/80. Patient reports good compliance and tolerance on Lisinopril.   . Fatigue    Patient would like to address concerns of fatigue that he has been experiencing for the past several days.   States he is moving into a house from a trailer and notices he has little energy. Attributes it to deconditioning as he is not exercising and eating a lot of sweets: "I crave sugar". Last had f/u with his cardiologist in December of last year.   Review of Systems  Gastrointestinal:       States for the last two weeks he has been having one bowel movement weekly associated with cramping, loose stringy stools, fecal urgency, and at times incontinence.       Objective:   Physical Exam  Constitutional: He appears well-developed and well-nourished. No distress.  Lungs: clear Heart: irregularly irregular without murmur Lower extremities: no edema; pedal pulses intact, sensation to monofilament intact, no wounds noted.     Assessment:    1. Type 2 diabetes mellitus without complication, without long-term current use of insulin ([place]: poor control, will add third medication - POCT glycosylated hemoglobin (Hb A1C) - sitaGLIPtin (JANUVIA) 100 MG tablet; Take 1 tablet (100 mg total) by mouth daily.  Dispense: 30 tablet; Refill: 2  2. Hypercholesteremia - Lipid panel  3. Other fatigue - CBC with Differential/Platelet  4. Benign essential HTN - Comprehensive metabolic panel  5. Constipation, unspecified constipation type    Plan:    Try generic Miralax. Consider  decreasing metformin if no improvement. Resume exercise and minimize sweet intake. Further f/u pending lab work.

## 2015-05-21 NOTE — Patient Instructions (Addendum)
Try generic  Miralax daily and see if you can increase your frequency of bowel movements and not have the explosive uncontrolled episodes. Resume exercise 30 minutes daily and minimize sweet intake.

## 2015-05-24 DIAGNOSIS — R5383 Other fatigue: Secondary | ICD-10-CM | POA: Diagnosis not present

## 2015-05-24 DIAGNOSIS — E7800 Pure hypercholesterolemia, unspecified: Secondary | ICD-10-CM | POA: Diagnosis not present

## 2015-05-24 DIAGNOSIS — I10 Essential (primary) hypertension: Secondary | ICD-10-CM | POA: Diagnosis not present

## 2015-05-25 ENCOUNTER — Telehealth: Payer: Self-pay

## 2015-05-25 LAB — CBC WITH DIFFERENTIAL/PLATELET
Basophils Absolute: 0 10*3/uL (ref 0.0–0.2)
Basos: 0 %
EOS (ABSOLUTE): 0.2 10*3/uL (ref 0.0–0.4)
Eos: 2 %
Hematocrit: 43.7 % (ref 37.5–51.0)
Hemoglobin: 14.7 g/dL (ref 12.6–17.7)
Immature Grans (Abs): 0 10*3/uL (ref 0.0–0.1)
Immature Granulocytes: 0 %
Lymphocytes Absolute: 1.7 10*3/uL (ref 0.7–3.1)
Lymphs: 21 %
MCH: 34.4 pg — ABNORMAL HIGH (ref 26.6–33.0)
MCHC: 33.6 g/dL (ref 31.5–35.7)
MCV: 102 fL — ABNORMAL HIGH (ref 79–97)
Monocytes Absolute: 0.5 10*3/uL (ref 0.1–0.9)
Monocytes: 7 %
Neutrophils Absolute: 5.4 10*3/uL (ref 1.4–7.0)
Neutrophils: 70 %
Platelets: 180 10*3/uL (ref 150–379)
RBC: 4.27 x10E6/uL (ref 4.14–5.80)
RDW: 13.7 % (ref 12.3–15.4)
WBC: 7.7 10*3/uL (ref 3.4–10.8)

## 2015-05-25 LAB — LIPID PANEL
Chol/HDL Ratio: 2.9 ratio units (ref 0.0–5.0)
Cholesterol, Total: 105 mg/dL (ref 100–199)
HDL: 36 mg/dL — ABNORMAL LOW (ref >39)
LDL Calculated: 42 mg/dL (ref 0–99)
Triglycerides: 136 mg/dL (ref 0–149)
VLDL Cholesterol Cal: 27 mg/dL (ref 5–40)

## 2015-05-25 LAB — COMPREHENSIVE METABOLIC PANEL
ALT: 30 IU/L (ref 0–44)
AST: 58 IU/L — ABNORMAL HIGH (ref 0–40)
Albumin/Globulin Ratio: 1.2 (ref 1.2–2.2)
Albumin: 3.7 g/dL (ref 3.6–4.8)
Alkaline Phosphatase: 81 IU/L (ref 39–117)
BUN/Creatinine Ratio: 10 (ref 10–24)
BUN: 9 mg/dL (ref 8–27)
Bilirubin Total: 1.4 mg/dL — ABNORMAL HIGH (ref 0.0–1.2)
CO2: 21 mmol/L (ref 18–29)
Calcium: 9.1 mg/dL (ref 8.6–10.2)
Chloride: 101 mmol/L (ref 96–106)
Creatinine, Ser: 0.93 mg/dL (ref 0.76–1.27)
GFR calc Af Amer: 99 mL/min/{1.73_m2} (ref >59)
GFR calc non Af Amer: 85 mL/min/{1.73_m2} (ref >59)
Globulin, Total: 3 g/dL (ref 1.5–4.5)
Glucose: 223 mg/dL — ABNORMAL HIGH (ref 65–99)
Potassium: 4.7 mmol/L (ref 3.5–5.2)
Sodium: 140 mmol/L (ref 134–144)
Total Protein: 6.7 g/dL (ref 6.0–8.5)

## 2015-05-25 NOTE — Telephone Encounter (Signed)
[id number] 

## 2015-05-25 NOTE — Telephone Encounter (Signed)
Patient has been advised. KW 

## 2015-05-25 NOTE — Telephone Encounter (Signed)
-----   Message from [name], [place] sent at 05/25/2015  7:49 AM EDT ----- Labs look ok except for elevated sugar as you know, and minimal elevation of two of your liver tests. No anemia at this time. Fatigue may be both deconditioning as you suggested and poor control of diabetes which we are working on.

## 2015-05-31 DIAGNOSIS — H401122 Primary open-angle glaucoma, left eye, moderate stage: Secondary | ICD-10-CM | POA: Diagnosis not present

## 2015-05-31 NOTE — Telephone Encounter (Signed)
Called patient no answer no message left.  Will try again.

## 2015-06-01 ENCOUNTER — Ambulatory Visit (INDEPENDENT_AMBULATORY_CARE_PROVIDER_SITE_OTHER): Payer: Medicare Other

## 2015-06-01 DIAGNOSIS — I482 Chronic atrial fibrillation, unspecified: Secondary | ICD-10-CM

## 2015-06-01 LAB — POCT INR
INR: 3.5
PT: 41.5

## 2015-06-01 NOTE — Telephone Encounter (Signed)
Patient picked up.

## 2015-06-01 NOTE — Patient Instructions (Signed)
Anticoagulation Dose Instructions as of 06/01/2015      Christian Berg Tue Wed Thu Fri Sat   New Dose 7.5 mg 7.5 mg 7.5 mg 7.5 mg 7.5 mg 10 mg 7.5 mg    Description        7.43m daily except 10 mg on F, f/u 2 weeks

## 2015-06-08 ENCOUNTER — Encounter: Payer: Self-pay | Admitting: Family Medicine

## 2015-06-08 ENCOUNTER — Ambulatory Visit (INDEPENDENT_AMBULATORY_CARE_PROVIDER_SITE_OTHER): Payer: Medicare Other | Admitting: Family Medicine

## 2015-06-08 VITALS — BP 134/92 | HR 83 | Temp 98.5°F | Resp 16 | Wt 294.8 lb

## 2015-06-08 DIAGNOSIS — R040 Epistaxis: Secondary | ICD-10-CM | POA: Diagnosis not present

## 2015-06-08 DIAGNOSIS — I482 Chronic atrial fibrillation, unspecified: Secondary | ICD-10-CM

## 2015-06-08 LAB — POCT INR
INR: 2.9
Prothrombin Time: 35.3

## 2015-06-08 LAB — POCT GLYCOSYLATED HEMOGLOBIN (HGB A1C): Hemoglobin A1C: 9.4

## 2015-06-08 NOTE — Progress Notes (Signed)
Subjective:     Patient ID: Christian Berg, male   DOB: Apr 22, 1950, 65 y.o.   MRN: [id number]  HPI  Chief Complaint  Patient presents with  . Epistaxis    Patient comes in office today with concerns of frequent nose bleeds from the left nostril for the past several days. Patient states that he was in the transition of moving and had been taking regular strength Aspirin instead of baby Aspirin. Patient reports that nose bleeds have been 2-3x a day  Reports he can stop the bleeding with tissue packing. Has not had his dose of coumadin today. Vision is impaired due to prior stroke.   Review of Systems     Objective:   Physical Exam  Constitutional: He appears well-developed and well-nourished. No distress.  HENT:  No nasal bleeding on presentation.       Assessment:    1. Chronic atrial fibrillation (HCC) - POCT INR  2. Epistaxis, recurrent    Plan:    Add nasal decongestant spray as needed for bleeding. Hold Coumadin today and resume 81 mg. ASA. Protime pending in a week.

## 2015-06-08 NOTE — Patient Instructions (Signed)
Use nasal decongestant spray after bleeding. Hold Coumadin today as you have done. Resume 81 mg.aspirin.

## 2015-06-13 ENCOUNTER — Encounter: Payer: Self-pay | Admitting: Family Medicine

## 2015-06-13 ENCOUNTER — Ambulatory Visit (INDEPENDENT_AMBULATORY_CARE_PROVIDER_SITE_OTHER): Payer: Medicare Other | Admitting: Family Medicine

## 2015-06-13 VITALS — BP 152/80 | HR 96 | Temp 98.2°F | Resp 16 | Wt 294.0 lb

## 2015-06-13 DIAGNOSIS — N390 Urinary tract infection, site not specified: Secondary | ICD-10-CM

## 2015-06-13 DIAGNOSIS — I482 Chronic atrial fibrillation, unspecified: Secondary | ICD-10-CM

## 2015-06-13 DIAGNOSIS — R319 Hematuria, unspecified: Secondary | ICD-10-CM

## 2015-06-13 LAB — POCT URINALYSIS DIPSTICK
Bilirubin, UA: NEGATIVE
Glucose, UA: 100
Ketones, UA: NEGATIVE
Nitrite, UA: POSITIVE
Spec Grav, UA: 1.02
Urobilinogen, UA: 0.2
pH, UA: 6

## 2015-06-13 LAB — POCT INR
INR: 2.1
PT: 24.9

## 2015-06-13 MED ORDER — DOXYCYCLINE HYCLATE 100 MG PO TABS: 100.0000 mg | ORAL_TABLET | Freq: Two times a day (BID) | ORAL | Status: AC

## 2015-06-13 NOTE — Patient Instructions (Signed)
Anticoagulation Dose Instructions as of 06/13/2015      Christian Berg Tue Wed Thu Fri Sat   New Dose 7.5 mg 7.5 mg 7.5 mg 7.5 mg 7.5 mg 7.5 mg 7.5 mg    Description        Current dose of Coumadin: 7.52m QD  INR:2.1 PT: 24.9 Changes made today: No change. Continue 7.571mQD Recheck: 3 weeks

## 2015-06-13 NOTE — Progress Notes (Signed)
Patient: Christian Berg Male    DOB: 02/29/1950   65 y.o.   MRN: [id number] Visit Date: 06/13/2015  Today's Provider: Lelon Huh, MD   Chief Complaint  Patient presents with  . Hematuria    x 1 day   Subjective:    Hematuria This is a new problem. The current episode started yesterday. The problem has been gradually improving since onset. He describes the hematuria as gross hematuria. He describes his urine color as dark red. Irritative symptoms include frequency and urgency. Obstructive symptoms include incomplete emptying. Obstructive symptoms do not include dribbling or straining. Associated symptoms include dysuria (burning). Pertinent negatives include no abdominal pain, chills, fever, nausea or vomiting.  Patient states his urine is a dark colored. He reports he has pain in his right kidney.Patient is currently on Coumadin 7.27m daily. His last PT/ INR check was done 06/08/2015 and the result was 2.9.     Allergies  Allergen Reactions  . Sulfa Antibiotics Hives   Previous Medications   ASPIRIN EC 81 MG TABLET    Take 81 mg by mouth at bedtime.    FLUOROURACIL (EFUDEX) 5 % CREAM    Apply 1 application topically 2 (two) times daily. As needed for DSAP   GLIPIZIDE (GLUCOTROL) 5 MG TABLET    Take 1 tablet (5 mg total) by mouth 2 (two) times daily before a meal.   LATANOPROST (XALATAN) 0.005 % OPHTHALMIC SOLUTION       LISINOPRIL (PRINIVIL,ZESTRIL) 20 MG TABLET    Take 1 tablet (20 mg total) by mouth daily.   LOVASTATIN (MEVACOR) 10 MG TABLET    Take 2 tablets (20 mg total) by mouth daily.   METFORMIN (GLUCOPHAGE) 850 MG TABLET    TAKE ONE TABLET BY MOUTH TWICE DAILY WITH MEALS   METOPROLOL (LOPRESSOR) 100 MG TABLET    TAKE ONE TABLET BY MOUTH TWICE DAILY   NYSTATIN CREAM (MYCOSTATIN)    Apply 1 application topically 2 (two) times daily.   SITAGLIPTIN (JANUVIA) 100 MG TABLET    Take 1 tablet (100 mg total) by mouth daily.   TRAMADOL-ACETAMINOPHEN (ULTRACET) 37.5-325 MG  TABLET    One to two every 6 hours as needed for back pain   WARFARIN (COUMADIN) 10 MG TABLET    Two times a week   WARFARIN (COUMADIN) 7.5 MG TABLET    Take 1 tablet (7.5 mg total) by mouth daily.    Review of Systems  Constitutional: Negative for fever, chills and appetite change.  Respiratory: Negative for chest tightness, shortness of breath and wheezing.   Cardiovascular: Negative for chest pain and palpitations.  Gastrointestinal: Negative for nausea, vomiting and abdominal pain.  Genitourinary: Positive for dysuria (burning), urgency, frequency, hematuria and incomplete emptying.  Musculoskeletal: Positive for back pain (pain near right kidney).    Social History  Substance Use Topics  . Smoking status: Never Smoker   . Smokeless tobacco: Never Used  . Alcohol Use: No   Objective:   BP 152/80 mmHg  Pulse 96  Temp(Src) 98.2 F (36.8 C) (Oral)  Resp 16  Wt 294 lb (133.358 kg)  SpO2 97%  Physical Exam   General Appearance:    Alert, cooperative, no distress  Eyes:    PERRL, conjunctiva/corneas clear, EOM's intact       Lungs:     Clear to auscultation bilaterally, respirations unlabored  Heart:    Regular rate and rhythm  Neurologic:   Awake, alert, oriented x  3. No apparent focal neurological           defect.   Abd:   No CVAT   Results for orders placed or performed in visit on 06/13/15  POCT urinalysis dipstick  Result Value Ref Range   Color, UA Amber    Clarity, UA cloudy    Glucose, UA 100 (trace)    Bilirubin, UA Negative    Ketones, UA Negative    Spec Grav, UA 1.020    Blood, UA Large (Hemolyzed)    pH, UA 6.0    Protein, UA Trace    Urobilinogen, UA 0.2    Nitrite, UA Positive    Leukocytes, UA moderate (2+) (A) Negative  POCT INR  Result Value Ref Range   INR 2.1    PT 24.9        Assessment & Plan:     1. Chronic atrial fibrillation (HCC)  - POCT INR  2. Urinary tract infection with hematuria, site unspecified Doxycycline 163m twice  a day - POCT urinalysis dipstick - Urine Culture     The entirety of the information documented in the History of Present Illness, Review of Systems and Physical Exam were personally obtained by me. Portions of this information were initially documented by RMeyer Cory CMA and reviewed by me for thoroughness and accuracy.    DLelon Huh MD  [facility]

## 2015-06-15 ENCOUNTER — Ambulatory Visit: Payer: Self-pay

## 2015-06-15 ENCOUNTER — Ambulatory Visit: Payer: Medicare Other | Admitting: Family Medicine

## 2015-06-17 LAB — URINE CULTURE

## 2015-07-06 ENCOUNTER — Ambulatory Visit (INDEPENDENT_AMBULATORY_CARE_PROVIDER_SITE_OTHER): Payer: Medicare Other

## 2015-07-06 DIAGNOSIS — K7460 Unspecified cirrhosis of liver: Secondary | ICD-10-CM

## 2015-07-06 DIAGNOSIS — R188 Other ascites: Secondary | ICD-10-CM

## 2015-07-06 LAB — POCT INR
INR: 2.1
PT: 25.7

## 2015-07-06 NOTE — Patient Instructions (Signed)
Anticoagulation Dose Instructions as of 07/06/2015      Christian Berg Tue Wed Thu Fri Sat   New Dose 7.5 mg 7.5 mg 7.5 mg 7.5 mg 7.5 mg 7.5 mg 7.5 mg    Description        7.5 mg daily, f/u 4 weeks

## 2015-07-09 ENCOUNTER — Ambulatory Visit (INDEPENDENT_AMBULATORY_CARE_PROVIDER_SITE_OTHER): Payer: Medicare Other | Admitting: Family Medicine

## 2015-07-09 VITALS — BP 138/80 | HR 86 | Temp 98.5°F | Resp 16 | Ht 68.0 in | Wt 294.8 lb

## 2015-07-09 DIAGNOSIS — R42 Dizziness and giddiness: Secondary | ICD-10-CM

## 2015-07-09 DIAGNOSIS — S060X1A Concussion with loss of consciousness of 30 minutes or less, initial encounter: Secondary | ICD-10-CM | POA: Diagnosis not present

## 2015-07-09 NOTE — Patient Instructions (Addendum)
Put your brain to rest and try to avoid rapid changes in position pending CT scan.

## 2015-07-09 NOTE — Progress Notes (Signed)
Subjective:     Patient ID: Christian Berg, male   DOB: 01-Aug-1950, 65 y.o.   MRN: [id number]  HPI  Chief Complaint  Patient presents with  . Fall    pt fell twice this weekend Saturday morning,6/11 at 6 and sunday night, 6/12, at 9 p.m pt felt dizzy when he fell.pt had bruises on forehead from first fall and one on back side of head is from 2nd fall  He states on the first occasion he had gotten up suddenly to go to the door. He looked down at the door handle,developed vertigo, and fell on his forehead. Did not lose consciousness and dizziness abated quickly. He developed vertigo again the following night when he reached up to turn off a ceiling fan. This time he fell on the back of his head. Wife reports he was disoriented for several minutes before he cleared and realized what happened. States he developed a headache which has abated but remains sore where he struck his head. Denies palpitations or ictal activity. Remains on Coumadin atrial fib.   Review of Systems     Objective:   Physical Exam  Constitutional: He appears well-developed and well-nourished. No distress.  HENT:  Right Ear: No hemotympanum.  Left Ear: No hemotympanum.  Eyes: EOM are normal.  Right pupil aphakic/ left pupil reactive   Cardiovascular: An irregularly irregular rhythm present.  Rate controlled @ 100  Pulmonary/Chest: Breath sounds normal.  Musculoskeletal:  Grip strength 5/5/ symmetrically  Neurological: Coordination (finger to nose and heel to shin WNL,  Romberg negative) normal.  Skin:   Minor contusion/abrasions to his frontal and posterior scalp.       Assessment:    1. Vertigo: ? Positional ? Orthostatic ? Paroxysmal A. Fib  2. Concussion, with loss of consciousness of 30 minutes or less, initial encounter - CT Head Wo Contrast; Future    Plan:    Patient defers ENT referral Discussed putting his brain to rest and avoid sudden positional changes or climbing ladders. 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

## 2015-07-11 ENCOUNTER — Ambulatory Visit
Admission: RE | Admit: 2015-07-11 | Discharge: 2015-07-11 | Disposition: A | Discharge status: Home / Self Care | Payer: Medicare Other | Source: Ambulatory Visit | Attending: Family Medicine | Admitting: Family Medicine

## 2015-07-11 DIAGNOSIS — Z7901 Long term (current) use of anticoagulants: Secondary | ICD-10-CM | POA: Diagnosis not present

## 2015-07-11 DIAGNOSIS — W19XXXA Unspecified fall, initial encounter: Secondary | ICD-10-CM | POA: Diagnosis not present

## 2015-07-11 DIAGNOSIS — Z8673 Personal history of transient ischemic attack (TIA), and cerebral infarction without residual deficits: Secondary | ICD-10-CM | POA: Diagnosis not present

## 2015-07-11 DIAGNOSIS — S060X1A Concussion with loss of consciousness of 30 minutes or less, initial encounter: Secondary | ICD-10-CM | POA: Diagnosis not present

## 2015-07-11 DIAGNOSIS — R296 Repeated falls: Secondary | ICD-10-CM | POA: Diagnosis not present

## 2015-07-18 ENCOUNTER — Other Ambulatory Visit: Payer: Self-pay | Admitting: Family Medicine

## 2015-07-26 ENCOUNTER — Other Ambulatory Visit: Payer: Self-pay | Admitting: Family Medicine

## 2015-07-30 ENCOUNTER — Other Ambulatory Visit: Payer: Self-pay | Admitting: Family Medicine

## 2015-08-01 ENCOUNTER — Other Ambulatory Visit: Payer: Self-pay | Admitting: Family Medicine

## 2015-08-02 ENCOUNTER — Other Ambulatory Visit: Payer: Self-pay | Admitting: Family Medicine

## 2015-08-03 ENCOUNTER — Ambulatory Visit (INDEPENDENT_AMBULATORY_CARE_PROVIDER_SITE_OTHER): Payer: Medicare Other

## 2015-08-03 DIAGNOSIS — I482 Chronic atrial fibrillation, unspecified: Secondary | ICD-10-CM

## 2015-08-03 LAB — POCT INR
INR: 2.1
PT: 25.3

## 2015-08-03 NOTE — Patient Instructions (Signed)
Anticoagulation Dose Instructions as of 08/03/2015      Sun Mon Tue Wed Thu Fri Sat   New Dose 7.5 mg 7.5 mg 7.5 mg 7.5 mg 7.5 mg 7.5 mg 7.5 mg    Description        7.5 mg daily, f/u 4 weeks

## 2015-08-31 ENCOUNTER — Ambulatory Visit (INDEPENDENT_AMBULATORY_CARE_PROVIDER_SITE_OTHER): Payer: Medicare Other

## 2015-08-31 DIAGNOSIS — I482 Chronic atrial fibrillation, unspecified: Secondary | ICD-10-CM

## 2015-08-31 LAB — POCT INR
INR: 2.1
PT: 25.5

## 2015-08-31 NOTE — Patient Instructions (Signed)
Anticoagulation Dose Instructions as of 08/31/2015      Christian Berg Tue Wed Thu Fri Sat   New Dose 7.5 mg 7.5 mg 7.5 mg 7.5 mg 7.5 mg 7.5 mg 7.5 mg    Description   7.5 mg daily, f/u 4 weeks

## 2015-09-05 ENCOUNTER — Other Ambulatory Visit: Payer: Self-pay | Admitting: Family Medicine

## 2015-09-05 DIAGNOSIS — E119 Type 2 diabetes mellitus without complications: Secondary | ICD-10-CM

## 2015-09-05 NOTE — Telephone Encounter (Signed)
error 

## 2015-09-06 ENCOUNTER — Other Ambulatory Visit: Payer: Self-pay | Admitting: Family Medicine

## 2015-09-15 ENCOUNTER — Emergency Department (HOSPITAL_COMMUNITY): Payer: Medicare Other

## 2015-09-15 ENCOUNTER — Other Ambulatory Visit: Payer: Self-pay | Admitting: Internal Medicine

## 2015-09-15 ENCOUNTER — Inpatient Hospital Stay (HOSPITAL_COMMUNITY)
Admission: EM | Admit: 2015-09-15 | Discharge: 2015-09-19 | DRG: 563 | Disposition: A | Discharge status: Home / Self Care | Payer: Medicare Other | Attending: Surgery | Admitting: Surgery

## 2015-09-15 ENCOUNTER — Encounter (HOSPITAL_COMMUNITY): Payer: Self-pay | Admitting: *Deleted

## 2015-09-15 DIAGNOSIS — S3981XA Other specified injuries of abdomen, initial encounter: Secondary | ICD-10-CM | POA: Diagnosis not present

## 2015-09-15 DIAGNOSIS — I5032 Chronic diastolic (congestive) heart failure: Secondary | ICD-10-CM | POA: Diagnosis present

## 2015-09-15 DIAGNOSIS — S40012A Contusion of left shoulder, initial encounter: Secondary | ICD-10-CM | POA: Diagnosis not present

## 2015-09-15 DIAGNOSIS — Z79899 Other long term (current) drug therapy: Secondary | ICD-10-CM | POA: Diagnosis not present

## 2015-09-15 DIAGNOSIS — S199XXA Unspecified injury of neck, initial encounter: Secondary | ICD-10-CM | POA: Diagnosis not present

## 2015-09-15 DIAGNOSIS — I4510 Unspecified right bundle-branch block: Secondary | ICD-10-CM | POA: Diagnosis present

## 2015-09-15 DIAGNOSIS — Z881 Allergy status to other antibiotic agents status: Secondary | ICD-10-CM | POA: Diagnosis not present

## 2015-09-15 DIAGNOSIS — L565 Disseminated superficial actinic porokeratosis (DSAP): Secondary | ICD-10-CM | POA: Diagnosis present

## 2015-09-15 DIAGNOSIS — Z950 Presence of cardiac pacemaker: Secondary | ICD-10-CM | POA: Diagnosis not present

## 2015-09-15 DIAGNOSIS — S299XXA Unspecified injury of thorax, initial encounter: Secondary | ICD-10-CM | POA: Diagnosis not present

## 2015-09-15 DIAGNOSIS — S42292A Other displaced fracture of upper end of left humerus, initial encounter for closed fracture: Secondary | ICD-10-CM | POA: Diagnosis not present

## 2015-09-15 DIAGNOSIS — Y92007 Garden or yard of unspecified non-institutional (private) residence as the place of occurrence of the external cause: Secondary | ICD-10-CM | POA: Diagnosis not present

## 2015-09-15 DIAGNOSIS — E785 Hyperlipidemia, unspecified: Secondary | ICD-10-CM | POA: Diagnosis present

## 2015-09-15 DIAGNOSIS — I110 Hypertensive heart disease with heart failure: Secondary | ICD-10-CM | POA: Diagnosis present

## 2015-09-15 DIAGNOSIS — I4891 Unspecified atrial fibrillation: Secondary | ICD-10-CM | POA: Diagnosis not present

## 2015-09-15 DIAGNOSIS — E1151 Type 2 diabetes mellitus with diabetic peripheral angiopathy without gangrene: Secondary | ICD-10-CM | POA: Diagnosis present

## 2015-09-15 DIAGNOSIS — I509 Heart failure, unspecified: Secondary | ICD-10-CM | POA: Diagnosis not present

## 2015-09-15 DIAGNOSIS — S42295A Other nondisplaced fracture of upper end of left humerus, initial encounter for closed fracture: Secondary | ICD-10-CM | POA: Diagnosis not present

## 2015-09-15 DIAGNOSIS — Z6841 Body Mass Index (BMI) 40.0 and over, adult: Secondary | ICD-10-CM

## 2015-09-15 DIAGNOSIS — S0990XA Unspecified injury of head, initial encounter: Secondary | ICD-10-CM | POA: Diagnosis not present

## 2015-09-15 DIAGNOSIS — Z8249 Family history of ischemic heart disease and other diseases of the circulatory system: Secondary | ICD-10-CM

## 2015-09-15 DIAGNOSIS — N400 Enlarged prostate without lower urinary tract symptoms: Secondary | ICD-10-CM | POA: Diagnosis present

## 2015-09-15 DIAGNOSIS — S2232XA Fracture of one rib, left side, initial encounter for closed fracture: Secondary | ICD-10-CM | POA: Diagnosis not present

## 2015-09-15 DIAGNOSIS — I482 Chronic atrial fibrillation, unspecified: Secondary | ICD-10-CM | POA: Diagnosis present

## 2015-09-15 DIAGNOSIS — I959 Hypotension, unspecified: Secondary | ICD-10-CM | POA: Diagnosis not present

## 2015-09-15 DIAGNOSIS — Z8673 Personal history of transient ischemic attack (TIA), and cerebral infarction without residual deficits: Secondary | ICD-10-CM | POA: Diagnosis not present

## 2015-09-15 DIAGNOSIS — Z7901 Long term (current) use of anticoagulants: Secondary | ICD-10-CM

## 2015-09-15 DIAGNOSIS — I700 Atherosclerosis of aorta: Secondary | ICD-10-CM | POA: Diagnosis not present

## 2015-09-15 DIAGNOSIS — W28XXXA Contact with powered lawn mower, initial encounter: Secondary | ICD-10-CM

## 2015-09-15 DIAGNOSIS — S42302A Unspecified fracture of shaft of humerus, left arm, initial encounter for closed fracture: Secondary | ICD-10-CM | POA: Diagnosis not present

## 2015-09-15 DIAGNOSIS — S42202A Unspecified fracture of upper end of left humerus, initial encounter for closed fracture: Secondary | ICD-10-CM | POA: Diagnosis not present

## 2015-09-15 DIAGNOSIS — E7800 Pure hypercholesterolemia, unspecified: Secondary | ICD-10-CM | POA: Diagnosis present

## 2015-09-15 DIAGNOSIS — Z7984 Long term (current) use of oral hypoglycemic drugs: Secondary | ICD-10-CM

## 2015-09-15 DIAGNOSIS — Z7982 Long term (current) use of aspirin: Secondary | ICD-10-CM

## 2015-09-15 DIAGNOSIS — D62 Acute posthemorrhagic anemia: Secondary | ICD-10-CM | POA: Diagnosis present

## 2015-09-15 DIAGNOSIS — M25512 Pain in left shoulder: Secondary | ICD-10-CM | POA: Diagnosis not present

## 2015-09-15 DIAGNOSIS — Y92009 Unspecified place in unspecified non-institutional (private) residence as the place of occurrence of the external cause: Secondary | ICD-10-CM

## 2015-09-15 LAB — CBC
HCT: 41.7 % (ref 39.0–52.0)
HCT: 36.2 % — ABNORMAL LOW (ref 39.0–52.0)
Hemoglobin: 14.1 g/dL (ref 13.0–17.0)
Hemoglobin: 12.3 g/dL — ABNORMAL LOW (ref 13.0–17.0)
MCH: 33.6 pg (ref 26.0–34.0)
MCH: 33.6 pg (ref 26.0–34.0)
MCHC: 33.8 g/dL (ref 30.0–36.0)
MCHC: 34 g/dL (ref 30.0–36.0)
MCV: 99.3 fL (ref 78.0–100.0)
MCV: 98.9 fL (ref 78.0–100.0)
Platelets: 120 10*3/uL — ABNORMAL LOW (ref 150–400)
Platelets: 136 10*3/uL — ABNORMAL LOW (ref 150–400)
RBC: 4.2 MIL/uL — ABNORMAL LOW (ref 4.22–5.81)
RBC: 3.66 MIL/uL — ABNORMAL LOW (ref 4.22–5.81)
RDW: 13.1 % (ref 11.5–15.5)
RDW: 13.2 % (ref 11.5–15.5)
WBC: 6.9 10*3/uL (ref 4.0–10.5)
WBC: 7.7 10*3/uL (ref 4.0–10.5)

## 2015-09-15 LAB — PREPARE FRESH FROZEN PLASMA
Unit division: 0
Unit division: 0

## 2015-09-15 LAB — I-STAT CHEM 8, ED
BUN: 6 mg/dL (ref 6–20)
Calcium, Ion: 1.1 mmol/L — ABNORMAL LOW (ref 1.12–1.23)
Chloride: 103 mmol/L (ref 101–111)
Creatinine, Ser: 0.9 mg/dL (ref 0.61–1.24)
Glucose, Bld: 251 mg/dL — ABNORMAL HIGH (ref 65–99)
HCT: 40 % (ref 39.0–52.0)
Hemoglobin: 13.6 g/dL (ref 13.0–17.0)
Potassium: 3.9 mmol/L (ref 3.5–5.1)
Sodium: 139 mmol/L (ref 135–145)
TCO2: 21 mmol/L (ref 0–100)

## 2015-09-15 LAB — TYPE AND SCREEN
ABO/RH(D): O POS
Antibody Screen: NEGATIVE
Unit division: 0
Unit division: 0

## 2015-09-15 LAB — BASIC METABOLIC PANEL
Anion gap: 12 (ref 5–15)
BUN: 7 mg/dL (ref 6–20)
CO2: 17 mmol/L — ABNORMAL LOW (ref 22–32)
Calcium: 9 mg/dL (ref 8.9–10.3)
Chloride: 106 mmol/L (ref 101–111)
Creatinine, Ser: 0.89 mg/dL (ref 0.61–1.24)
GFR calc Af Amer: >60 mL/min (ref >60)
GFR calc non Af Amer: >60 mL/min (ref >60)
Glucose, Bld: 267 mg/dL — ABNORMAL HIGH (ref 65–99)
Potassium: 4.4 mmol/L (ref 3.5–5.1)
Sodium: 135 mmol/L (ref 135–145)

## 2015-09-15 LAB — ABO/RH: ABO/RH(D): O POS

## 2015-09-15 LAB — CBG MONITORING, ED: Glucose-Capillary: 272 mg/dL — ABNORMAL HIGH (ref 65–99)

## 2015-09-15 LAB — PROTIME-INR
INR: 2.28
Prothrombin Time: 25.5 seconds — ABNORMAL HIGH (ref 11.4–15.2)

## 2015-09-15 MED ORDER — SODIUM CHLORIDE 0.9 % IV BOLUS (SEPSIS)
1000.0000 mL | Freq: Once | INTRAVENOUS | Status: AC
  Administered 2015-09-15: 1000 mL via INTRAVENOUS

## 2015-09-15 MED ORDER — FENTANYL CITRATE (PF) 100 MCG/2ML IJ SOLN
INTRAMUSCULAR | Status: AC
  Filled 2015-09-15: qty 2

## 2015-09-15 MED ORDER — VITAMIN K1 10 MG/ML IJ SOLN
10.0000 mg | INTRAVENOUS | Status: DC
  Filled 2015-09-15: qty 1

## 2015-09-15 MED ORDER — FENTANYL CITRATE (PF) 100 MCG/2ML IJ SOLN
100.0000 ug | Freq: Once | INTRAMUSCULAR | Status: AC
  Administered 2015-09-15: 100 ug via INTRAVENOUS

## 2015-09-15 MED ORDER — PROTHROMBIN COMPLEX CONC HUMAN 500 UNITS IV KIT: 2500.0000 [IU] | PACK | INTRAVENOUS | Status: DC

## 2015-09-15 MED ORDER — FENTANYL CITRATE (PF) 100 MCG/2ML IJ SOLN: 50.0000 ug | Freq: Once | INTRAMUSCULAR | Status: DC

## 2015-09-15 MED ORDER — OXYCODONE HCL 5 MG PO TABS
5.0000 mg | ORAL_TABLET | ORAL | Status: DC | PRN
  Administered 2015-09-15 – 2015-09-19 (×12): 5 mg via ORAL
  Filled 2015-09-15 (×12): qty 1

## 2015-09-15 MED ORDER — HYDROMORPHONE HCL 1 MG/ML IJ SOLN
1.0000 mg | Freq: Once | INTRAMUSCULAR | Status: AC
  Administered 2015-09-15: 1 mg via INTRAVENOUS
  Filled 2015-09-15: qty 1

## 2015-09-15 MED ORDER — CEFAZOLIN IN D5W 1 GM/50ML IV SOLN
1.0000 g | Freq: Once | INTRAVENOUS | Status: AC
  Administered 2015-09-15: 1 g via INTRAVENOUS
  Filled 2015-09-15: qty 50

## 2015-09-15 NOTE — ED Triage Notes (Addendum)
Per [facility] pt was mowing grass & went over an embankment & the lawn mower rolled over on top of the pt, Fire Dept removed the [employer], pt has 3 cm abrasion to L forehead, denies LOC, deformity to L shoulder with quarter size bruising present, L extremity immobolized on arrival, pt in A fib with hx of the same, denies taking blood thinners, A&O x4, pt rcvd 100 mcg Fentanyl pta

## 2015-09-15 NOTE — ED Notes (Signed)
Nurse notified provider about hematoma on left shoulder blistering and growing from a quarter size to about an orange.  No orders given.

## 2015-09-15 NOTE — Consult Note (Signed)
Cardiology Consult    Patient ID: Christian Berg MRN: [id number], DOB/AGE: 1950-07-07   Admit date: 09/15/2015 Date of Consult: 09/15/2015  Primary Physician: [name], PA Reason for Consult:  Anticoagulation management  Primary Cardiologist: Unknown Requesting Provider: Dr. Georgette Dover    History of Present Illness    65 year old male with a past medical history of atrial fibrillation, congestive heart failure (EF unknown), hypertension, and hyperlipidemia presents to the emergency department after experiencing a lawnmower accident. The patient was seen by trauma surgery after he was evaluated in the Ed. The CT scan was significant for a left humerus fracture and rib fracture. The patient has a long history of atrial fibrillation and has experienced 3 strokes when he was taken off his coumadin. The patient was seen by Dr. Gershon Crane who noticed a left shoulder hematoma that was expanding. While in the CT scanner, the patient become hypotensive but was asymptomatic. At this time, the patient is complaining of pain at the site of the fracture but no chest pain or palpitations.   Past Medical History   Past Medical History:  Diagnosis Date  . A-fib ([place])   . CHF (congestive heart failure) ([place])   . Degenerative disc disease, lumbar    s/p injury  . Diabetes mellitus without complication ([place])   . Disseminated superficial actinic porokeratosis   . Dysrhythmia    A-FIB, palpatations  . Hypercholesteremia   . Hypertension   . Kidney stones   . Peripheral vascular disease ([place])    Carotid stenosis  . Presence of permanent cardiac pacemaker    Inactive  . TIA (transient ischemic attack) 2011   No deficits    Past Surgical History:  Procedure Laterality Date  . APPENDECTOMY  2004  . CARDIAC CATHETERIZATION    . CARDIAC PACEMAKER PLACEMENT  2005  . CATARACT EXTRACTION W/PHACO Right 06/14/2014   Procedure: CATARACT EXTRACTION PHACO AND INTRAOCULAR LENS PLACEMENT (IOC);  Surgeon:  Leandrew Koyanagi, MD;  Location: [place];  Service: Ophthalmology;  Laterality: Right;  . CYSTOSCOPY W/ RETROGRADES Bilateral 08/09/2014   Procedure: CYSTOSCOPY WITH RETROGRADE PYELOGRAM;  Surgeon: Hollice Espy, MD;  Location: [facility];  Service: Urology;  Laterality: Bilateral;     Allergies  Allergies  Allergen Reactions  . Sulfa Antibiotics Hives    Inpatient Medications    . fentaNYL        Family History    Family History  Problem Relation Age of Onset  . Adopted: Yes  . Heart disease Mother   . Fibromyalgia Sister   . Hypertension Daughter   . Migraines Daughter   . Kidney disease Neg Hx   . Prostate cancer Neg Hx   . Bladder Cancer Neg Hx     Social History    Social History   Social History  . Marital status: Married    Spouse name: N/A  . Number of children: N/A  . Years of education: N/A   Occupational History  . Not on file.   Social History Main Topics  . Smoking status: Never Smoker  . Smokeless tobacco: Never Used  . Alcohol use No  . Drug use: No  . Sexual activity: Not on file   Other Topics Concern  . Not on file   Social History Narrative  . No narrative on file     Review of Systems    All other systems reviewed and are otherwise negative except as noted above.  Physical Exam  Blood pressure 122/67, pulse (!) 127, temperature 98.2 F (36.8 C), temperature source Oral, resp. rate 21, height 5' 8"  (1.727 m), weight 296 lb (134.3 kg), SpO2 98 %.  GEN: NAD, A&Ox3 HEENT: NCAT, EOMI, No JVD CV: RRR, +S1/S2, No m/r/g's appreciated.   Pulm: CTA bilaterally  Abdomen: +BS, NTND Extremities: No c/c/e, 2+ pulses. Left humerus fracture complicated by left shoulder hematoma. Neuro: No focal neurological deficits, CN II-XII Grossly intact.    Labs    Troponin (Point of Care Test) No results for input(s): TROPIPOC in the last 72 hours. No results for input(s): CKTOTAL, CKMB, TROPONINI in the last 72 hours. Lab  Results  Component Value Date   WBC 7.7 09/15/2015   HGB 12.3 (L) 09/15/2015   HCT 36.2 (L) 09/15/2015   MCV 98.9 09/15/2015   PLT 136 (L) 09/15/2015    Recent Labs Lab 09/15/15 1355 09/15/15 1527  NA 135 139  K 4.4 3.9  CL 106 103  CO2 17*  --   BUN 7 6  CREATININE 0.89 0.90  CALCIUM 9.0  --   GLUCOSE 267* 251*   Lab Results  Component Value Date   CHOL 105 05/24/2015   HDL 36 (L) 05/24/2015   LDLCALC 42 05/24/2015   TRIG 136 05/24/2015   No results found for: [facility]   Radiology Studies    Dg Chest 2 View  Result Date: 09/15/2015 CLINICAL DATA:  Low more fell onto patient. Severe left shoulder pain. EXAM: CHEST  2 VIEW COMPARISON:  11/11/2014 FINDINGS: Cardiac silhouette is mildly enlarged. No mediastinal or hilar masses or evidence of adenopathy. Lung volumes are low. Lungs are clear. No pleural effusion or pneumothorax. Stable well-positioned left anterior chest wall pacemaker. Bony thorax is demineralized. Humeral head projects over the posterior margin of the glenoid. It may be subluxed posteriorly. Posterior dislocation is possible and not excluded based on the limited views of the shoulder obtained on the shoulder radiographs. IMPRESSION: No acute cardiopulmonary disease. Electronically Signed   By: Lajean Manes M.D.   On: 09/15/2015 14:44   Ct Head Wo Contrast  Result Date: 09/15/2015 CLINICAL DATA:  65 year old male status post blunt trauma, was run over struck by tractor. Initial encounter. EXAM: CT HEAD WITHOUT CONTRAST CT CERVICAL SPINE WITHOUT CONTRAST TECHNIQUE: Multidetector CT imaging of the head and cervical spine was performed following the standard protocol without intravenous contrast. Multiplanar CT image reconstructions of the cervical spine were also generated. COMPARISON:  Head CT without contrast 07/11/2015 and earlier. FINDINGS: CT HEAD FINDINGS No scalp hematoma identified. Negative visualized orbit and face soft tissues. Visualized paranasal sinuses  and mastoids are stable and well pneumatized. Calvarium intact. Chronic right PCA infarct with unchanged right occipital lobe encephalomalacia. Elsewhere, Gray-white matter differentiation is within normal limits throughout the brain. No midline shift, ventriculomegaly, mass effect, evidence of mass lesion, intracranial hemorrhage or evidence of cortically based acute infarction. CT CERVICAL SPINE FINDINGS Quantum mottle artifact from large body habitus. Mild reversal of cervical lordosis. Visualized skull base is intact. No atlanto-occipital dissociation. Cervicothoracic junction alignment is within normal limits. Bilateral posterior element alignment is within normal limits. Bulky anterior cervical spine endplate spurring. Moderate to severe upper cervical facet degeneration greater on the left. No acute cervical spine fracture identified. Visible upper thoracic levels appear intact. There is a partially visible severe fracture about the proximal left humerus with surrounding intramuscular and subcutaneous hematoma. Retropharyngeal course of the cervical carotids. Otherwise negative noncontrast neck soft tissues. Partially visible left chest  pacemaker device. There is a nondisplaced fracture of the left anterolateral third rib (series 209, image 101). No pneumothorax or pleural effusion in the lung apices. Mediastinal lipomatosis. IMPRESSION: 1.  No acute intracranial injury.  Chronic right PCA infarct. 2. No acute fracture or listhesis identified in the cervical spine. Ligamentous injury is not excluded. 3. Partially visible comminuted proximal left humerus fracture with intramuscular and subcutaneous hematoma. 4. Nondisplaced fracture left anterolateral third rib. 5. Negative visualized lung apices. CT chest abdomen and pelvis from today reported separately. Electronically Signed   By: Genevie Ann M.D.   On: 09/15/2015 17:32   Ct Chest W Contrast  Result Date: 09/15/2015 CLINICAL DATA:  Run over by a lawnmower.  Trauma to the left shoulder region and chest. EXAM: CT CHEST, ABDOMEN, AND PELVIS WITH CONTRAST TECHNIQUE: Multidetector CT imaging of the chest, abdomen and pelvis was performed following the standard protocol during bolus administration of intravenous contrast. CONTRAST:  100 cc Isovue-300 COMPARISON:  11/11/2014 FINDINGS: CT CHEST FINDINGS Mediastinum/Lymph Nodes: No traumatic finding. Pacemaker inserted from the left side. No mass or lymphadenopathy. Lungs/Pleura: Clear and normal. Musculoskeletal: Left humeral fracture described elsewhere. No rib fracture seen. No fracture of the scapula. No fracture of the thoracic spine. Ordinary spondylosis. No fracture of the sternum. CT ABDOMEN PELVIS FINDINGS Hepatobiliary: Chronic changes of cirrhosis as described previously. No evidence of focal liver lesion by CT. No calcified gallstones. Pancreas: Normal Spleen: Normal.  No injury. Adrenals/Urinary Tract: Normal adrenal glands. Few areas of renal scarring without evidence of cyst, mass, stone or hydronephrosis. No renal injury. Both kidneys function. Stomach/Bowel: No evidence of bowel injury. No focal bowel pathology. Vascular/Lymphatic: Aortic atherosclerosis. No aneurysm. No evidence of vascular injury. IVC is normal. No retroperitoneal lymphadenopathy. Reproductive: Bladder, prostate gland and seminal vesicles are unremarkable. Other: None. Musculoskeletal: Chronic degenerative changes throughout the lower thoracic and lumbar spine, with mild chronic superior endplate depression at U88 and L2. IMPRESSION: Left humeral fracture. No other acute or traumatic finding in the region imaged. Cirrhosis of the liver. Aortic atherosclerosis. Electronically Signed   By: Nelson Chimes M.D.   On: 09/15/2015 17:33   Ct Cervical Spine Wo Contrast  Result Date: 09/15/2015 CLINICAL DATA:  65 year old male status post blunt trauma, was run over struck by tractor. Initial encounter. EXAM: CT HEAD WITHOUT CONTRAST CT CERVICAL  SPINE WITHOUT CONTRAST TECHNIQUE: Multidetector CT imaging of the head and cervical spine was performed following the standard protocol without intravenous contrast. Multiplanar CT image reconstructions of the cervical spine were also generated. COMPARISON:  Head CT without contrast 07/11/2015 and earlier. FINDINGS: CT HEAD FINDINGS No scalp hematoma identified. Negative visualized orbit and face soft tissues. Visualized paranasal sinuses and mastoids are stable and well pneumatized. Calvarium intact. Chronic right PCA infarct with unchanged right occipital lobe encephalomalacia. Elsewhere, Gray-white matter differentiation is within normal limits throughout the brain. No midline shift, ventriculomegaly, mass effect, evidence of mass lesion, intracranial hemorrhage or evidence of cortically based acute infarction. CT CERVICAL SPINE FINDINGS Quantum mottle artifact from large body habitus. Mild reversal of cervical lordosis. Visualized skull base is intact. No atlanto-occipital dissociation. Cervicothoracic junction alignment is within normal limits. Bilateral posterior element alignment is within normal limits. Bulky anterior cervical spine endplate spurring. Moderate to severe upper cervical facet degeneration greater on the left. No acute cervical spine fracture identified. Visible upper thoracic levels appear intact. There is a partially visible severe fracture about the proximal left humerus with surrounding intramuscular and subcutaneous hematoma. Retropharyngeal course of  the cervical carotids. Otherwise negative noncontrast neck soft tissues. Partially visible left chest pacemaker device. There is a nondisplaced fracture of the left anterolateral third rib (series 209, image 101). No pneumothorax or pleural effusion in the lung apices. Mediastinal lipomatosis. IMPRESSION: 1.  No acute intracranial injury.  Chronic right PCA infarct. 2. No acute fracture or listhesis identified in the cervical spine.  Ligamentous injury is not excluded. 3. Partially visible comminuted proximal left humerus fracture with intramuscular and subcutaneous hematoma. 4. Nondisplaced fracture left anterolateral third rib. 5. Negative visualized lung apices. CT chest abdomen and pelvis from today reported separately. Electronically Signed   By: Genevie Ann M.D.   On: 09/15/2015 17:32   Ct Abdomen Pelvis W Contrast  Result Date: 09/15/2015 CLINICAL DATA:  Run over by a lawnmower. Trauma to the left shoulder region and chest. EXAM: CT CHEST, ABDOMEN, AND PELVIS WITH CONTRAST TECHNIQUE: Multidetector CT imaging of the chest, abdomen and pelvis was performed following the standard protocol during bolus administration of intravenous contrast. CONTRAST:  100 cc Isovue-300 COMPARISON:  11/11/2014 FINDINGS: CT CHEST FINDINGS Mediastinum/Lymph Nodes: No traumatic finding. Pacemaker inserted from the left side. No mass or lymphadenopathy. Lungs/Pleura: Clear and normal. Musculoskeletal: Left humeral fracture described elsewhere. No rib fracture seen. No fracture of the scapula. No fracture of the thoracic spine. Ordinary spondylosis. No fracture of the sternum. CT ABDOMEN PELVIS FINDINGS Hepatobiliary: Chronic changes of cirrhosis as described previously. No evidence of focal liver lesion by CT. No calcified gallstones. Pancreas: Normal Spleen: Normal.  No injury. Adrenals/Urinary Tract: Normal adrenal glands. Few areas of renal scarring without evidence of cyst, mass, stone or hydronephrosis. No renal injury. Both kidneys function. Stomach/Bowel: No evidence of bowel injury. No focal bowel pathology. Vascular/Lymphatic: Aortic atherosclerosis. No aneurysm. No evidence of vascular injury. IVC is normal. No retroperitoneal lymphadenopathy. Reproductive: Bladder, prostate gland and seminal vesicles are unremarkable. Other: None. Musculoskeletal: Chronic degenerative changes throughout the lower thoracic and lumbar spine, with mild chronic superior  endplate depression at U27 and L2. IMPRESSION: Left humeral fracture. No other acute or traumatic finding in the region imaged. Cirrhosis of the liver. Aortic atherosclerosis. Electronically Signed   By: Nelson Chimes M.D.   On: 09/15/2015 17:33   Dg Shoulder Left  Result Date: 09/15/2015 CLINICAL DATA:  Lawnmower fell onto patient, severe left shoulder pain.Axillary view was not obtained due to patients condition EXAM: LEFT SHOULDER - 2+ VIEW COMPARISON:  None. FINDINGS: There is a fracture across the proximal left humeral metaphysis. Fracture is displaced mildly comminuted. The shaft fracture component has migrated superiorly and overlaps the humeral head component, most likely anterior to the humeral head component. Displacement is approximately 4 cm. The humeral head appears aligned with the glenoid on the limited views acquired. AC joint is normally aligned. Bones are diffusely demineralized. IMPRESSION: Displaced fracture the proximal left humerus, across the metaphysis with comminuted components. No convincing dislocation on the views acquired. Electronically Signed   By: Lajean Manes M.D.   On: 09/15/2015 14:42    EKG & Cardiac Imaging    EKG: Atrial fibrillation at 127 beats per minutes, RBBB, ST depressions in leads V5-V6.   Assessment & Plan  65 year old male with a past medical history as above presents with atrial fibrillation with RVR and left shoulder hematoma s/p lawnmower accident.  1. Atrial fibrillation with RVR--> likely multifactorial from pain and blood loss. Would control pain and monitor HR. Gentle IVF. -Patient's INR 2.3 at this time. Would not continue  coumadin in light of worsening hematoma and would monitor CBC's/hematoma closely. Once INR is <2 and if patient is without evidence of further bleeding would initiate a heparin gtt given history of multiple CVA's (risks of no a/c >benefit). If no further bleeding occurs once the heparin gtt is started then coumadin can be  restarted.  -Continue with lopressor for rate control.   2. CHF -Patient's euvolemic at this time. Would monitor volume status closely.     Signed,  [employer]: [phone]

## 2015-09-15 NOTE — ED Notes (Signed)
BP dropped to the 90/50, Cardiology made aware while rounding the pt 250 mL NS bolus given per Cardiology order. Pt AO x 4, NAD noticed, family at the bedside.

## 2015-09-15 NOTE — ED Notes (Signed)
Pt and family member updated about the wait for a bed on stepdown, recliner chair provided to wife for comfort while waiting for a bed.

## 2015-09-15 NOTE — ED Notes (Signed)
Family at bedside. 

## 2015-09-15 NOTE — ED Notes (Signed)
MD at bedside. 

## 2015-09-15 NOTE — ED Provider Notes (Signed)
[facility] DEPT Provider Note   CSN: [id number] Arrival date & time: 09/15/15  1320  History   Chief Complaint Chief Complaint  Patient presents with  . Shoulder Pain   HPI Christian Berg is a 65 y.o. male.  HPI 65 y.o. male with a hx of Afib on Coumadin, CHF, DM, HLD, HTN, presents to the Emergency Department today complaining of left shoulder pain s/p fall from mower. Pt states that he was riding his lawnmower when he reach an embankment that caused the mower to tip over. Pt states that the mower landed on top of him. Fire department had to remove lawnmower. Noted 3cm superficial abrasion to left forehead. No LOC. No vision changes. No N/V. No headache. No dizziness. States only pain is in left shoulder. Unsure if landed on arm or not. States pain is 5/10 after getting fentanyl en route. Noted bruise on anterior aspect of shoulder. No numbness/tingling. No other symptoms noted.    Past Medical History:  Diagnosis Date  . A-fib ([place])   . CHF (congestive heart failure) ([place])   . Degenerative disc disease, lumbar    s/p injury  . Diabetes mellitus without complication ([place])   . Disseminated superficial actinic porokeratosis   . Dysrhythmia    A-FIB, palpatations  . Hypercholesteremia   . Hypertension   . Kidney stones   . Peripheral vascular disease ([place])    Carotid stenosis  . Presence of permanent cardiac pacemaker    Inactive  . TIA (transient ischemic attack) 2011   No deficits    Patient Active Problem List   Diagnosis Date Noted  . Ischemic embolic stroke ([place]) 22/48/2500  . Congestive heart failure ([place]) 12/15/2014  . Peripheral vascular disease ([place]) 12/15/2014  . Morbid (severe) obesity due to excess calories ([place]) 11/22/2014  . VFD (visual field defect) 11/22/2014  . Cerebrovascular accident (CVA) of uncertain pathology involving posterior circulation ([place]) 11/14/2014  . History of TIA (transient ischemic attack) 10/31/2014  . Kidney stones 07/24/2014  .  Other cirrhosis of liver ([place]) 07/24/2014  . DSAP (disseminated superficial actinic porokeratosis) 07/17/2014  . Asthma 05/31/2014  . Chronic diastolic heart failure ([place]) 05/31/2014  . Diabetes mellitus ([place]) 05/31/2014  . Hypercholesteremia 05/31/2014  . B12 deficiency 05/31/2014  . Carotid artery narrowing 02/15/2014  . Chronic atrial fibrillation ([place]) 10/24/2013  . 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 Benign prostatic hyperplasia with urinary obstruction 09/06/2012  . Heart disease 05/29/2009  . Benign essential HTN 09/29/2008  . Narrowing of intervertebral disc space 01/11/2007    Past Surgical History:  Procedure Laterality Date  . APPENDECTOMY  2004  . CARDIAC CATHETERIZATION    . CARDIAC PACEMAKER PLACEMENT  2005  . CATARACT EXTRACTION W/PHACO Right 06/14/2014   Procedure: CATARACT EXTRACTION PHACO AND INTRAOCULAR LENS PLACEMENT (IOC);  Surgeon: Leandrew Koyanagi, MD;  Location: [facility];  Service: Ophthalmology;  Laterality: Right;  . CYSTOSCOPY W/ RETROGRADES Bilateral 08/09/2014   Procedure: CYSTOSCOPY WITH RETROGRADE PYELOGRAM;  Surgeon: Hollice Espy, MD;  Location: [facility];  Service: Urology;  Laterality: Bilateral;       Home Medications    Prior to Admission medications   Medication Sig Start Date End Date Taking? Authorizing Provider  aspirin EC 81 MG tablet Take 81 mg by mouth at bedtime.     Historical Provider, MD  fluorouracil (EFUDEX) 5 % cream Apply 1 application topically 2 (two) times daily. As needed for DSAP 04/13/15   [name], PA  glipiZIDE (GLUCOTROL) 5 MG tablet  TAKE ONE TABLET BY MOUTH TWICE DAILY BEFORE MEAL(S) 09/05/15   [name], PA  latanoprost (XALATAN) 0.005 % ophthalmic solution  05/31/15   Historical Provider, MD  lisinopril (PRINIVIL,ZESTRIL) 20 MG tablet Take 1 tablet (20 mg total) by mouth daily. 12/19/14   [name], PA  lovastatin (MEVACOR) 10 MG tablet TAKE TWO TABLETS BY MOUTH ONCE DAILY 08/01/15   [name],  PA  metFORMIN (GLUCOPHAGE) 850 MG tablet Take 1 tablet (850 mg total) by mouth 2 (two) times daily with a meal. 07/26/15   [name], PA  metoprolol (LOPRESSOR) 100 MG tablet TAKE ONE TABLET BY MOUTH TWICE DAILY 12/19/14   [name], PA  nystatin cream (MYCOSTATIN) Apply 1 application topically 2 (two) times daily. 07/17/14   Larene [place] A McGowan, PA-C  sitaGLIPtin (JANUVIA) 100 MG tablet Take 1 tablet (100 mg total) by mouth daily. 05/21/15   [name], PA  traMADol-acetaminophen (ULTRACET) 37.5-325 MG tablet One to two every 6 hours as needed for back pain 03/22/15   [name], PA  warfarin (COUMADIN) 7.5 MG tablet TAKE ONE TABLET BY MOUTH ONCE DAILY 07/18/15   [name], PA    Family History Family History  Problem Relation Age of Onset  . Adopted: Yes  . Heart disease Mother   . Fibromyalgia Sister   . Hypertension Daughter   . Migraines Daughter   . Kidney disease Neg Hx   . Prostate cancer Neg Hx   . Bladder Cancer Neg Hx     Social History Social History  Substance Use Topics  . Smoking status: Never Smoker  . Smokeless tobacco: Never Used  . Alcohol use No     Allergies   Sulfa antibiotics   Review of Systems Review of Systems ROS reviewed and all are negative for acute change except as noted in the HPI.  Physical Exam Updated Vital Signs BP 115/90   Pulse 110   Temp 98.3 F (36.8 C) (Oral)   Resp 15   Ht 5' 8"  (1.727 m)   Wt 134.3 kg   SpO2 95%   BMI 45.01 kg/m   Physical Exam  Constitutional: He is oriented to person, place, and time. Vital signs are normal. He appears well-developed and well-nourished.  HENT:  Head: Normocephalic. Head is with abrasion. Head is without raccoon's eyes, without Battle's sign, without laceration, without right periorbital erythema and without left periorbital erythema.  Right Ear: Hearing normal.  Left Ear: Hearing normal.  3cm Abrasion on forehead. Bleeding controlled.  Eyes: Conjunctivae and EOM  are normal. Pupils are equal, round, and reactive to light.  Neck: Trachea normal, normal range of motion, full passive range of motion without pain and phonation normal. Neck supple. No spinous process tenderness and no muscular tenderness present. No neck rigidity. No edema, no erythema and normal range of motion present.  Cardiovascular: Normal heart sounds, intact distal pulses and normal pulses.  An irregularly irregular rhythm present. Tachycardia present.   Pulmonary/Chest: Effort normal and breath sounds normal. No respiratory distress. He has no wheezes. He has no rales. He exhibits no tenderness and no bony tenderness.  Abdominal: Soft. Normal appearance. There is no tenderness. There is no rigidity, no rebound, no guarding, no CVA tenderness, no tenderness at McBurney's point and negative Murphy's sign.  Musculoskeletal:       Left shoulder: He exhibits decreased range of motion.       Cervical back: Normal.       Thoracic back: Normal.  Left  Shoulder with limited ROM. Pt in sling. TTP. Neurovascularly intact. Motor/sensation intact. Cap refill <2sec. Small 1cm ecchymosis noted on left anterior shoulder.   Neurological: He is alert and oriented to person, place, and time. He has normal strength. No cranial nerve deficit or sensory deficit.  Cranial Nerves:  II: Pupils equal, round, reactive to light III,IV, VI: ptosis not present, extra-ocular motions intact bilaterally  V,VII: smile symmetric, facial light touch sensation equal VIII: hearing grossly normal bilaterally  IX,X: midline uvula rise  XI: bilateral shoulder shrug equal and strong XII: midline tongue extension  Skin: Skin is warm and dry.  Psychiatric: He has a normal mood and affect. His speech is normal and behavior is normal. Thought content normal.  Nursing note and vitals reviewed.  ED Treatments / Results  Labs (all labs ordered are listed, but only abnormal results are displayed) Labs Reviewed  CBC - Abnormal;  Notable for the following:       Result Value   RBC 4.20 (*)    Platelets 120 (*)    All other components within normal limits  BASIC METABOLIC PANEL - Abnormal; Notable for the following:    CO2 17 (*)    Glucose, Bld 267 (*)    All other components within normal limits  PROTIME-INR - Abnormal; Notable for the following:    Prothrombin Time 25.5 (*)    All other components within normal limits  CBG MONITORING, ED - Abnormal; Notable for the following:    Glucose-Capillary 272 (*)    All other components within normal limits  I-STAT CHEM 8, ED - Abnormal; Notable for the following:    Glucose, Bld 251 (*)    Calcium, Ion 1.10 (*)    All other components within normal limits  TYPE AND SCREEN  PREPARE FRESH FROZEN PLASMA    EKG  EKG Interpretation None       Radiology Dg Chest 2 View  Result Date: 09/15/2015 CLINICAL DATA:  Low more fell onto patient. Severe left shoulder pain. EXAM: CHEST  2 VIEW COMPARISON:  11/11/2014 FINDINGS: Cardiac silhouette is mildly enlarged. No mediastinal or hilar masses or evidence of adenopathy. Lung volumes are low. Lungs are clear. No pleural effusion or pneumothorax. Stable well-positioned left anterior chest wall pacemaker. Bony thorax is demineralized. Humeral head projects over the posterior margin of the glenoid. It may be subluxed posteriorly. Posterior dislocation is possible and not excluded based on the limited views of the shoulder obtained on the shoulder radiographs. IMPRESSION: No acute cardiopulmonary disease. Electronically Signed   By: Lajean Manes M.D.   On: 09/15/2015 14:44   Dg Shoulder Left  Result Date: 09/15/2015 CLINICAL DATA:  Lawnmower fell onto patient, severe left shoulder pain.Axillary view was not obtained due to patients condition EXAM: LEFT SHOULDER - 2+ VIEW COMPARISON:  None. FINDINGS: There is a fracture across the proximal left humeral metaphysis. Fracture is displaced mildly comminuted. The shaft fracture  component has migrated superiorly and overlaps the humeral head component, most likely anterior to the humeral head component. Displacement is approximately 4 cm. The humeral head appears aligned with the glenoid on the limited views acquired. AC joint is normally aligned. Bones are diffusely demineralized. IMPRESSION: Displaced fracture the proximal left humerus, across the metaphysis with comminuted components. No convincing dislocation on the views acquired. Electronically Signed   By: Lajean Manes M.D.   On: 09/15/2015 14:42    Procedures Procedures (including critical care time)  Medications Ordered in ED Medications  sodium  chloride 0.9 % bolus 1,000 mL (not administered)  HYDROmorphone (DILAUDID) injection 1 mg (1 mg Intravenous Given 09/15/15 1351)   Initial Impression / Assessment and Plan / ED Course  I have reviewed the triage vital signs and the nursing notes.  Pertinent labs & imaging results that were available during my care of the patient were reviewed by me and considered in my medical decision making (see chart for details).  Clinical Course   Final Clinical Impressions(s) / ED Diagnoses  I have reviewed and evaluated the relevant laboratory valuesI have reviewed and evaluated the relevant imaging studies. I have interpreted the relevant EKG.I have reviewed the relevant previous healthcare records.I have reviewed EMS Documentation.I obtained HPI from historian. Patient discussed with supervising physician  ED Course:  Assessment: Pt is a 54yM with hx Afib on Coumadin, CHF, DM, HLD, HTN who presents with left shoulder pain s/p fall from lawn mower. Noted head trauma with abrasion. No LOC. No N/V. Pt on blood thinners. On exam, pt in NAD. Nontoxic/nonseptic appearing. VS with tachycardia. Afebrile. Lungs CTA. Abdomen nontender soft. Limited ROM of left shoulder with sling in place. Neurovascularly intact. CBC/BMP unreamrkable. PT/INR obtained. EKG with Afib. CT Head/Neck  pending. XR Left Shoulder showed displaced proximal left humerus fracture. Placed in sling immobilizer per Ortho consult (Dr. Lyla Glassing) Given analgesia in ED.   4:02 PM- Pt became increasingly hypotensive in ED with systolic in the 16X upon repeats. Only given 75m Dilaudid earlier. Possible side effect? Discussed with supervising physician who has seen and evaluated patient. Due to distracting injury with humerus fracture and continued hypotension. Will upgrade to Level 1 Trauma. Bedside Ultrasound difficult due to body habitus. Sent to CT Scan for Chest/ABD. Continue to bolus with fluids. Trauma surgery to see.     4:04 PM- Sign out to [employer] Dis[employer] pending imaging results. Anticipate admission to Obs.   Disposition/Plan:  Dispo pending imaging results  Supervising Physician DJulianne Rice MD   Final diagnoses:  Proximal humerus fracture, left, closed, initial encounter    New Prescriptions New Prescriptions   No medications on file     TShary Decamp PA-C 09/15/15 1639    DJulianne Rice MD 09/16/15 1640

## 2015-09-15 NOTE — ED Notes (Signed)
Lita Mains, MD at bedside, neg fast per MD with limited visibilty d/t pts size, pt to receive CT scans

## 2015-09-15 NOTE — ED Notes (Signed)
Ortho surgeon at the bedside.
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
## 2015-09-15 NOTE — Progress Notes (Signed)
   09/15/15 1600  Clinical Encounter Type  Visited With Family  Visit Type ED  Referral From Nurse  Spiritual Encounters  Spiritual Needs Emotional  Stress Factors  Family Stress Factors Exhausted;Health changes  Chaplain received page for [facility] 1 Trauma, patient already in department. Upon arrival in room, found patient's wife waiting; said her husband was getting an ultrasound. Provided hospitality and kept her company for a short time. Malessa Zartman, Chaplain
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
## 2015-09-15 NOTE — H&P (Signed)
History   Christian Berg is an 65 y.o. male.   Chief Complaint:  Chief Complaint  Patient presents with  . Shoulder Pain    HPI 65 yo male - lawnmower accident.  A riding lawnmower rolled over, landing on his left shoulder and chest.  He came is a non-trauma activation and was being evaluated by the ED.  Several hours after arrival, he was on the way to the CT scanner when he was noted to be hypotensive.  A level I activation was called, but upon the arrival of the trauma team, the patient was in the CT scanner and his blood pressure was normal.  I discussed with the EDP, since I had to take another patient to the operating room.  His CT scans showed no internal injuries.  His only significant injuries are a left humerus fracture and a rib fracture.  However, the patient is anticoagulated and he has a hematoma in his left shoulder that is expanding and he has been tachycardic.   Past Medical History:  Diagnosis Date  . A-fib ([place])   . CHF (congestive heart failure) ([place])   . Degenerative disc disease, lumbar    s/p injury  . Diabetes mellitus without complication ([place])   . Disseminated superficial actinic porokeratosis   . Dysrhythmia    A-FIB, palpatations  . Hypercholesteremia   . Hypertension   . Kidney stones   . Peripheral vascular disease ([place])    Carotid stenosis  . Presence of permanent cardiac pacemaker    Inactive  . TIA (transient ischemic attack) 2011   No deficits    Past Surgical History:  Procedure Laterality Date  . APPENDECTOMY  2004  . CARDIAC CATHETERIZATION    . CARDIAC PACEMAKER PLACEMENT  2005  . CATARACT EXTRACTION W/PHACO Right 06/14/2014   Procedure: CATARACT EXTRACTION PHACO AND INTRAOCULAR LENS PLACEMENT (IOC);  Surgeon: Leandrew Koyanagi, MD;  Location: [facility];  Service: Ophthalmology;  Laterality: Right;  . CYSTOSCOPY W/ RETROGRADES Bilateral 08/09/2014   Procedure: CYSTOSCOPY WITH RETROGRADE PYELOGRAM;  Surgeon: Hollice Espy, MD;   Location: [facility];  Service: Urology;  Laterality: Bilateral;    Family History  Problem Relation Age of Onset  . Adopted: Yes  . Heart disease Mother   . Fibromyalgia Sister   . Hypertension Daughter   . Migraines Daughter   . Kidney disease Neg Hx   . Prostate cancer Neg Hx   . Bladder Cancer Neg Hx    Social History:  reports that he has never smoked. He has never used smokeless tobacco. He reports that he does not drink alcohol or use drugs.  Allergies   Allergies  Allergen Reactions  . Sulfa Antibiotics Hives    Home Medications   Prior to Admission medications   Medication Sig Start Date End Date Taking? Authorizing Provider  aspirin EC 81 MG tablet Take 81 mg by mouth at bedtime.    Yes Historical Provider, MD  fluorouracil (EFUDEX) 5 % cream Apply 1 application topically 2 (two) times daily. As needed for DSAP 04/13/15  Yes [name], PA  glipiZIDE (GLUCOTROL) 5 MG tablet Take 2.5 mg by mouth 2 (two) times daily before a meal.   Yes Historical Provider, MD  lisinopril (PRINIVIL,ZESTRIL) 20 MG tablet Take 1 tablet (20 mg total) by mouth daily. 12/19/14  Yes [name], PA  lovastatin (MEVACOR) 10 MG tablet TAKE TWO TABLETS BY MOUTH ONCE DAILY 08/01/15  Yes [name], PA  metFORMIN (GLUCOPHAGE) 850 MG  tablet Take 1 tablet (850 mg total) by mouth 2 (two) times daily with a meal. 07/26/15  Yes [name], PA  metoprolol (LOPRESSOR) 100 MG tablet TAKE ONE TABLET BY MOUTH TWICE DAILY 12/19/14  Yes [name], PA  nystatin cream (MYCOSTATIN) Apply 1 application topically 2 (two) times daily. Patient taking differently: Apply 1 application topically 2 (two) times daily as needed.  07/17/14  Yes Shannon A McGowan, PA-C  traMADol-acetaminophen (ULTRACET) 37.5-325 MG tablet One to two every 6 hours as needed for back pain Patient taking differently: Take 2-3 tablets by mouth See admin instructions. Pt takes 3 tablets in the morning, and 2 to 3 tablets every  evening 03/22/15  Yes [name], PA  warfarin (COUMADIN) 7.5 MG tablet TAKE ONE TABLET BY MOUTH ONCE DAILY 07/18/15  Yes [name], PA     Trauma Course   Results for orders placed or performed during the hospital encounter of 09/15/15 (from the past 48 hour(s))  CBC     Status: Abnormal   Collection Time: 09/15/15  1:55 PM  Result Value Ref Range   WBC 6.9 4.0 - 10.5 K/uL   RBC 4.20 (L) 4.22 - 5.81 MIL/uL   Hemoglobin 14.1 13.0 - 17.0 g/dL   HCT 41.7 39.0 - 52.0 %   MCV 99.3 78.0 - 100.0 fL   MCH 33.6 26.0 - 34.0 pg   MCHC 33.8 30.0 - 36.0 g/dL   RDW 13.1 11.5 - 15.5 %   Platelets 120 (L) 150 - 400 K/uL  Basic metabolic panel     Status: Abnormal   Collection Time: 09/15/15  1:55 PM  Result Value Ref Range   Sodium 135 135 - 145 mmol/L   Potassium 4.4 3.5 - 5.1 mmol/L   Chloride 106 101 - 111 mmol/L   CO2 17 (L) 22 - 32 mmol/L   Glucose, Bld 267 (H) 65 - 99 mg/dL   BUN 7 6 - 20 mg/dL   Creatinine, Ser 0.89 0.61 - 1.24 mg/dL   Calcium 9.0 8.9 - 10.3 mg/dL   GFR calc non Af Amer >60 >60 mL/min   GFR calc Af Amer >60 >60 mL/min    Comment: (NOTE) The eGFR has been calculated using the CKD EPI equation. This calculation has not been validated in all clinical situations. eGFR's persistently <60 mL/min signify possible Chronic Kidney Disease.    Anion gap 12 5 - 15  Protime-INR     Status: Abnormal   Collection Time: 09/15/15  1:55 PM  Result Value Ref Range   Prothrombin Time 25.5 (H) 11.4 - 15.2 seconds   INR 2.28   CBG monitoring, ED     Status: Abnormal   Collection Time: 09/15/15  3:08 PM  Result Value Ref Range   Glucose-Capillary 272 (H) 65 - 99 mg/dL  I-Stat Chem 8, ED     Status: Abnormal   Collection Time: 09/15/15  3:27 PM  Result Value Ref Range   Sodium 139 135 - 145 mmol/L   Potassium 3.9 3.5 - 5.1 mmol/L   Chloride 103 101 - 111 mmol/L   BUN 6 6 - 20 mg/dL   Creatinine, Ser 0.90 0.61 - 1.24 mg/dL   Glucose, Bld 251 (H) 65 - 99 mg/dL    Calcium, Ion 1.10 (L) 1.12 - 1.23 mmol/L   TCO2 21 0 - 100 mmol/L   Hemoglobin 13.6 13.0 - 17.0 g/dL   HCT 40.0 39.0 - 52.0 %  Prepare fresh frozen plasma  Status: None   Collection Time: 09/15/15  3:52 PM  Result Value Ref Range   Unit Number [id number]    Blood Component Type THAWED PLASMA    Unit division 00    Status of Unit REL FROM [facility]    Unit tag comment VERBAL ORDERS PER DR YELVERTON    Transfusion Status OK TO TRANSFUSE    Unit Number [id number]    Blood Component Type THAWED PLASMA    Unit division 00    Status of Unit REL FROM [facility]    Unit tag comment VERBAL ORDERS PER DR YELVERTON    Transfusion Status OK TO TRANSFUSE   Type and screen [place]     Status: None   Collection Time: 09/15/15  4:48 PM  Result Value Ref Range   ABO/RH(D) O POS    Antibody Screen NEG    Sample Expiration 09/18/2015    Unit Number [id number]    Blood Component Type RED CELLS,LR    Unit division 00    Status of Unit REL FROM [facility]    Unit tag comment VERBAL ORDERS PER DR YELVERTON    Transfusion Status OK TO TRANSFUSE    Crossmatch Result NOT NEEDED    Unit Number [id number]    Blood Component Type RED CELLS,LR    Unit division 00    Status of Unit REL FROM [facility]    Unit tag comment VERBAL ORDERS PER DR Lita Mains    Transfusion Status OK TO TRANSFUSE    Crossmatch Result NOT NEEDED   ABO/Rh     Status: None   Collection Time: 09/15/15  4:48 PM  Result Value Ref Range   ABO/RH(D) O POS    Dg Chest 2 View  Result Date: 09/15/2015 CLINICAL DATA:  Low more fell onto patient. Severe left shoulder pain. EXAM: CHEST  2 VIEW COMPARISON:  11/11/2014 FINDINGS: Cardiac silhouette is mildly enlarged. No mediastinal or hilar masses or evidence of adenopathy. Lung volumes are low. Lungs are clear. No pleural effusion or pneumothorax. Stable well-positioned left anterior chest wall pacemaker. Bony thorax is demineralized. Humeral head projects over the  posterior margin of the glenoid. It may be subluxed posteriorly. Posterior dislocation is possible and not excluded based on the limited views of the shoulder obtained on the shoulder radiographs. IMPRESSION: No acute cardiopulmonary disease. Electronically Signed   By: Lajean Manes M.D.   On: 09/15/2015 14:44   Ct Head Wo Contrast  Result Date: 09/15/2015 CLINICAL DATA:  65 year old male status post blunt trauma, was run over struck by tractor. Initial encounter. EXAM: CT HEAD WITHOUT CONTRAST CT CERVICAL SPINE WITHOUT CONTRAST TECHNIQUE: Multidetector CT imaging of the head and cervical spine was performed following the standard protocol without intravenous contrast. Multiplanar CT image reconstructions of the cervical spine were also generated. COMPARISON:  Head CT without contrast 07/11/2015 and earlier. FINDINGS: CT HEAD FINDINGS No scalp hematoma identified. Negative visualized orbit and face soft tissues. Visualized paranasal sinuses and mastoids are stable and well pneumatized. Calvarium intact. Chronic right PCA infarct with unchanged right occipital lobe encephalomalacia. Elsewhere, Gray-white matter differentiation is within normal limits throughout the brain. No midline shift, ventriculomegaly, mass effect, evidence of mass lesion, intracranial hemorrhage or evidence of cortically based acute infarction. CT CERVICAL SPINE FINDINGS Quantum mottle artifact from large body habitus. Mild reversal of cervical lordosis. Visualized skull base is intact. No atlanto-occipital dissociation. Cervicothoracic junction alignment is within normal limits. Bilateral posterior element alignment is within normal limits. Bulky anterior  cervical spine endplate spurring. Moderate to severe upper cervical facet degeneration greater on the left. No acute cervical spine fracture identified. Visible upper thoracic levels appear intact. There is a partially visible severe fracture about the proximal left humerus with  surrounding intramuscular and subcutaneous hematoma. Retropharyngeal course of the cervical carotids. Otherwise negative noncontrast neck soft tissues. Partially visible left chest pacemaker device. There is a nondisplaced fracture of the left anterolateral third rib (series 209, image 101). No pneumothorax or pleural effusion in the lung apices. Mediastinal lipomatosis. IMPRESSION: 1.  No acute intracranial injury.  Chronic right PCA infarct. 2. No acute fracture or listhesis identified in the cervical spine. Ligamentous injury is not excluded. 3. Partially visible comminuted proximal left humerus fracture with intramuscular and subcutaneous hematoma. 4. Nondisplaced fracture left anterolateral third rib. 5. Negative visualized lung apices. CT chest abdomen and pelvis from today reported separately. Electronically Signed   By: Genevie Ann M.D.   On: 09/15/2015 17:32   Ct Chest W Contrast  Result Date: 09/15/2015 CLINICAL DATA:  Run over by a lawnmower. Trauma to the left shoulder region and chest. EXAM: CT CHEST, ABDOMEN, AND PELVIS WITH CONTRAST TECHNIQUE: Multidetector CT imaging of the chest, abdomen and pelvis was performed following the standard protocol during bolus administration of intravenous contrast. CONTRAST:  100 cc Isovue-300 COMPARISON:  11/11/2014 FINDINGS: CT CHEST FINDINGS Mediastinum/Lymph Nodes: No traumatic finding. Pacemaker inserted from the left side. No mass or lymphadenopathy. Lungs/Pleura: Clear and normal. Musculoskeletal: Left humeral fracture described elsewhere. No rib fracture seen. No fracture of the scapula. No fracture of the thoracic spine. Ordinary spondylosis. No fracture of the sternum. CT ABDOMEN PELVIS FINDINGS Hepatobiliary: Chronic changes of cirrhosis as described previously. No evidence of focal liver lesion by CT. No calcified gallstones. Pancreas: Normal Spleen: Normal.  No injury. Adrenals/Urinary Tract: Normal adrenal glands. Few areas of renal scarring without  evidence of cyst, mass, stone or hydronephrosis. No renal injury. Both kidneys function. Stomach/Bowel: No evidence of bowel injury. No focal bowel pathology. Vascular/Lymphatic: Aortic atherosclerosis. No aneurysm. No evidence of vascular injury. IVC is normal. No retroperitoneal lymphadenopathy. Reproductive: Bladder, prostate gland and seminal vesicles are unremarkable. Other: None. Musculoskeletal: Chronic degenerative changes throughout the lower thoracic and lumbar spine, with mild chronic superior endplate depression at I34 and L2. IMPRESSION: Left humeral fracture. No other acute or traumatic finding in the region imaged. Cirrhosis of the liver. Aortic atherosclerosis. Electronically Signed   By: Nelson Chimes M.D.   On: 09/15/2015 17:33   Ct Cervical Spine Wo Contrast  Result Date: 09/15/2015 CLINICAL DATA:  65 year old male status post blunt trauma, was run over struck by tractor. Initial encounter. EXAM: CT HEAD WITHOUT CONTRAST CT CERVICAL SPINE WITHOUT CONTRAST TECHNIQUE: Multidetector CT imaging of the head and cervical spine was performed following the standard protocol without intravenous contrast. Multiplanar CT image reconstructions of the cervical spine were also generated. COMPARISON:  Head CT without contrast 07/11/2015 and earlier. FINDINGS: CT HEAD FINDINGS No scalp hematoma identified. Negative visualized orbit and face soft tissues. Visualized paranasal sinuses and mastoids are stable and well pneumatized. Calvarium intact. Chronic right PCA infarct with unchanged right occipital lobe encephalomalacia. Elsewhere, Gray-white matter differentiation is within normal limits throughout the brain. No midline shift, ventriculomegaly, mass effect, evidence of mass lesion, intracranial hemorrhage or evidence of cortically based acute infarction. CT CERVICAL SPINE FINDINGS Quantum mottle artifact from large body habitus. Mild reversal of cervical lordosis. Visualized skull base is intact. No  atlanto-occipital dissociation. Cervicothoracic junction alignment  is within normal limits. Bilateral posterior element alignment is within normal limits. Bulky anterior cervical spine endplate spurring. Moderate to severe upper cervical facet degeneration greater on the left. No acute cervical spine fracture identified. Visible upper thoracic levels appear intact. There is a partially visible severe fracture about the proximal left humerus with surrounding intramuscular and subcutaneous hematoma. Retropharyngeal course of the cervical carotids. Otherwise negative noncontrast neck soft tissues. Partially visible left chest pacemaker device. There is a nondisplaced fracture of the left anterolateral third rib (series 209, image 101). No pneumothorax or pleural effusion in the lung apices. Mediastinal lipomatosis. IMPRESSION: 1.  No acute intracranial injury.  Chronic right PCA infarct. 2. No acute fracture or listhesis identified in the cervical spine. Ligamentous injury is not excluded. 3. Partially visible comminuted proximal left humerus fracture with intramuscular and subcutaneous hematoma. 4. Nondisplaced fracture left anterolateral third rib. 5. Negative visualized lung apices. CT chest abdomen and pelvis from today reported separately. Electronically Signed   By: Genevie Ann M.D.   On: 09/15/2015 17:32   Ct Abdomen Pelvis W Contrast  Result Date: 09/15/2015 CLINICAL DATA:  Run over by a lawnmower. Trauma to the left shoulder region and chest. EXAM: CT CHEST, ABDOMEN, AND PELVIS WITH CONTRAST TECHNIQUE: Multidetector CT imaging of the chest, abdomen and pelvis was performed following the standard protocol during bolus administration of intravenous contrast. CONTRAST:  100 cc Isovue-300 COMPARISON:  11/11/2014 FINDINGS: CT CHEST FINDINGS Mediastinum/Lymph Nodes: No traumatic finding. Pacemaker inserted from the left side. No mass or lymphadenopathy. Lungs/Pleura: Clear and normal. Musculoskeletal: Left humeral  fracture described elsewhere. No rib fracture seen. No fracture of the scapula. No fracture of the thoracic spine. Ordinary spondylosis. No fracture of the sternum. CT ABDOMEN PELVIS FINDINGS Hepatobiliary: Chronic changes of cirrhosis as described previously. No evidence of focal liver lesion by CT. No calcified gallstones. Pancreas: Normal Spleen: Normal.  No injury. Adrenals/Urinary Tract: Normal adrenal glands. Few areas of renal scarring without evidence of cyst, mass, stone or hydronephrosis. No renal injury. Both kidneys function. Stomach/Bowel: No evidence of bowel injury. No focal bowel pathology. Vascular/Lymphatic: Aortic atherosclerosis. No aneurysm. No evidence of vascular injury. IVC is normal. No retroperitoneal lymphadenopathy. Reproductive: Bladder, prostate gland and seminal vesicles are unremarkable. Other: None. Musculoskeletal: Chronic degenerative changes throughout the lower thoracic and lumbar spine, with mild chronic superior endplate depression at M01 and L2. IMPRESSION: Left humeral fracture. No other acute or traumatic finding in the region imaged. Cirrhosis of the liver. Aortic atherosclerosis. Electronically Signed   By: Nelson Chimes M.D.   On: 09/15/2015 17:33   Dg Shoulder Left  Result Date: 09/15/2015 CLINICAL DATA:  Lawnmower fell onto patient, severe left shoulder pain.Axillary view was not obtained due to patients condition EXAM: LEFT SHOULDER - 2+ VIEW COMPARISON:  None. FINDINGS: There is a fracture across the proximal left humeral metaphysis. Fracture is displaced mildly comminuted. The shaft fracture component has migrated superiorly and overlaps the humeral head component, most likely anterior to the humeral head component. Displacement is approximately 4 cm. The humeral head appears aligned with the glenoid on the limited views acquired. AC joint is normally aligned. Bones are diffusely demineralized. IMPRESSION: Displaced fracture the proximal left humerus, across the  metaphysis with comminuted components. No convincing dislocation on the views acquired. Electronically Signed   By: Lajean Manes M.D.   On: 09/15/2015 14:42    Review of Systems  Constitutional: Negative for weight loss.  HENT: Negative for ear discharge, ear pain, hearing loss  and tinnitus.   Eyes: Negative for blurred vision, double vision, photophobia and pain.  Respiratory: Negative for cough, sputum production and shortness of breath.   Cardiovascular: Positive for chest pain.  Gastrointestinal: Negative for abdominal pain, nausea and vomiting.  Genitourinary: Negative for dysuria, flank pain, frequency and urgency.  Musculoskeletal: Positive for joint pain. Negative for back pain, falls, myalgias and neck pain.  Neurological: Negative for dizziness, tingling, sensory change, focal weakness, loss of consciousness and headaches.  Endo/Heme/Allergies: Does not bruise/bleed easily.  Psychiatric/Behavioral: Negative for depression, memory loss and substance abuse. The patient is not nervous/anxious.     Blood pressure 109/62, pulse (!) 124, temperature 98.2 F (36.8 C), temperature source Oral, resp. rate 19, height 5' 8"  (1.727 m), weight 134.3 kg (296 lb), SpO2 96 %. Physical Exam  Vitals reviewed. Constitutional: He is oriented to person, place, and time. He appears well-developed and well-nourished. He is cooperative. No distress. Cervical collar and nasal cannula in place.  HENT:  Head: Normocephalic and atraumatic. Head is without raccoon's eyes, without Battle's sign, without abrasion, without contusion and without laceration.  Right Ear: Hearing, tympanic membrane, external ear and ear canal normal. No lacerations. No drainage or tenderness. No foreign bodies. Tympanic membrane is not perforated. No hemotympanum.  Left Ear: Hearing, tympanic membrane, external ear and ear canal normal. No lacerations. No drainage or tenderness. No foreign bodies. Tympanic membrane is not  perforated. No hemotympanum.  Nose: Nose normal. No nose lacerations, sinus tenderness, nasal deformity or nasal septal hematoma. No epistaxis.  Mouth/Throat: Uvula is midline, oropharynx is clear and moist and mucous membranes are normal. No lacerations.  Eyes: Conjunctivae, EOM and lids are normal. Pupils are equal, round, and reactive to light. No scleral icterus.  Neck: Trachea normal. No JVD present. No spinous process tenderness and no muscular tenderness present. Carotid bruit is not present. No thyromegaly present.  Cardiovascular: Normal rate, regular rhythm, normal heart sounds, intact distal pulses and normal pulses.   Respiratory: Effort normal and breath sounds normal. No respiratory distress. He exhibits no tenderness, no bony tenderness, no laceration and no crepitus.  Left anterior shoulder - tender; ecchymosis; subcutaneous hematoma - reportedly larger than on arrival  GI: Soft. Normal appearance and bowel sounds are normal. He exhibits no distension. There is no tenderness. There is no rigidity, no rebound, no guarding and no CVA tenderness.  Musculoskeletal: He exhibits no edema or tenderness.  Left upper arm in sling; tender over humerus   Lymphadenopathy:    He has no cervical adenopathy.  Neurological: He is alert and oriented to person, place, and time. He has normal strength. No cranial nerve deficit or sensory deficit. GCS eye subscore is 4. GCS verbal subscore is 5. GCS motor subscore is 6.  Skin: Skin is warm, dry and intact. He is not diaphoretic.  Psychiatric: He has a normal mood and affect. His speech is normal and behavior is normal.     Assessment/Plan 1.  Lawnmower accident 2.  Left humerus fracture 3.  Anticoagulated 4.  Tachycardic/ atrial fibrillation 5.  Expanding left anterior shoulder expanding hematoma  Vitamin K Consult Cardiology re: pacer/ atrial fibrillation w RVR Admit to Stepdown Recheck Hgb   Nazaire Cordial K. 09/15/2015, 7:42  PM   Procedures

## 2015-09-15 NOTE — ED Notes (Signed)
CBG 272; MD and PA notified

## 2015-09-15 NOTE — Progress Notes (Signed)
Orthopedic Tech Progress Note Patient Details:  Christian Berg 06-Jun-1950 [id number]  Ortho Devices Type of Ortho Device: Shoulder immobilizer Ortho Device/Splint Location: LUE Ortho Device/Splint Interventions: Ordered, Application   Braulio Bosch 09/15/2015, 5:13 PM

## 2015-09-15 NOTE — ED Notes (Signed)
PA notified re: pts BP, this RN to bolus NS in with pressure bag & accompany pt to CT, pt A&O x4, follows commands, speaks in complete sentences, family aware of plan

## 2015-09-16 DIAGNOSIS — I4891 Unspecified atrial fibrillation: Secondary | ICD-10-CM

## 2015-09-16 DIAGNOSIS — S42292A Other displaced fracture of upper end of left humerus, initial encounter for closed fracture: Secondary | ICD-10-CM

## 2015-09-16 DIAGNOSIS — W28XXXA Contact with powered lawn mower, initial encounter: Secondary | ICD-10-CM

## 2015-09-16 LAB — CBC
HCT: 33.2 % — ABNORMAL LOW (ref 39.0–52.0)
Hemoglobin: 11.5 g/dL — ABNORMAL LOW (ref 13.0–17.0)
MCH: 34.4 pg — ABNORMAL HIGH (ref 26.0–34.0)
MCHC: 34.6 g/dL (ref 30.0–36.0)
MCV: 99.4 fL (ref 78.0–100.0)
Platelets: 127 10*3/uL — ABNORMAL LOW (ref 150–400)
RBC: 3.34 MIL/uL — ABNORMAL LOW (ref 4.22–5.81)
RDW: 13.5 % (ref 11.5–15.5)
WBC: 6.3 10*3/uL (ref 4.0–10.5)

## 2015-09-16 LAB — GLUCOSE, CAPILLARY
Glucose-Capillary: 164 mg/dL — ABNORMAL HIGH (ref 65–99)
Glucose-Capillary: 264 mg/dL — ABNORMAL HIGH (ref 65–99)
Glucose-Capillary: 182 mg/dL — ABNORMAL HIGH (ref 65–99)

## 2015-09-16 LAB — COMPREHENSIVE METABOLIC PANEL
ALT: 23 U/L (ref 17–63)
AST: 39 U/L (ref 15–41)
Albumin: 2.7 g/dL — ABNORMAL LOW (ref 3.5–5.0)
Alkaline Phosphatase: 45 U/L (ref 38–126)
Anion gap: 8 (ref 5–15)
BUN: 8 mg/dL (ref 6–20)
CO2: 20 mmol/L — ABNORMAL LOW (ref 22–32)
Calcium: 8.1 mg/dL — ABNORMAL LOW (ref 8.9–10.3)
Chloride: 108 mmol/L (ref 101–111)
Creatinine, Ser: 1.03 mg/dL (ref 0.61–1.24)
GFR calc Af Amer: >60 mL/min (ref >60)
GFR calc non Af Amer: >60 mL/min (ref >60)
Glucose, Bld: 206 mg/dL — ABNORMAL HIGH (ref 65–99)
Potassium: 3.5 mmol/L (ref 3.5–5.1)
Sodium: 136 mmol/L (ref 135–145)
Total Bilirubin: 1.5 mg/dL — ABNORMAL HIGH (ref 0.3–1.2)
Total Protein: 5.4 g/dL — ABNORMAL LOW (ref 6.5–8.1)

## 2015-09-16 LAB — PROTIME-INR
INR: 2.14
Prothrombin Time: 24.2 seconds — ABNORMAL HIGH (ref 11.4–15.2)

## 2015-09-16 LAB — MRSA PCR SCREENING: MRSA by PCR: NEGATIVE

## 2015-09-16 MED ORDER — POTASSIUM CHLORIDE IN NACL 20-0.9 MEQ/L-% IV SOLN
INTRAVENOUS | Status: DC
  Administered 2015-09-16 – 2015-09-17 (×2): via INTRAVENOUS
  Filled 2015-09-16 (×2): qty 1000

## 2015-09-16 MED ORDER — LISINOPRIL 20 MG PO TABS
20.0000 mg | ORAL_TABLET | Freq: Every day | ORAL | Status: DC
  Administered 2015-09-17: 20 mg via ORAL
  Filled 2015-09-16 (×2): qty 1

## 2015-09-16 MED ORDER — ONDANSETRON HCL 4 MG PO TABS: 4.0000 mg | ORAL_TABLET | Freq: Four times a day (QID) | ORAL | Status: DC | PRN

## 2015-09-16 MED ORDER — GLIPIZIDE 2.5 MG HALF TABLET
2.5000 mg | ORAL_TABLET | Freq: Two times a day (BID) | ORAL | Status: DC
  Administered 2015-09-16 – 2015-09-19 (×7): 2.5 mg via ORAL
  Filled 2015-09-16 (×9): qty 1

## 2015-09-16 MED ORDER — METOPROLOL TARTRATE 100 MG PO TABS
100.0000 mg | ORAL_TABLET | Freq: Two times a day (BID) | ORAL | Status: DC
  Administered 2015-09-16 – 2015-09-19 (×7): 100 mg via ORAL
  Filled 2015-09-16 (×7): qty 1

## 2015-09-16 MED ORDER — ONDANSETRON HCL 4 MG/2ML IJ SOLN: 4.0000 mg | Freq: Four times a day (QID) | INTRAMUSCULAR | Status: DC | PRN

## 2015-09-16 MED ORDER — MORPHINE SULFATE (PF) 2 MG/ML IV SOLN
2.0000 mg | INTRAVENOUS | Status: DC | PRN
  Administered 2015-09-16: 4 mg via INTRAVENOUS
  Administered 2015-09-16 (×2): 2 mg via INTRAVENOUS
  Administered 2015-09-17 (×2): 4 mg via INTRAVENOUS
  Filled 2015-09-16: qty 1
  Filled 2015-09-16 (×3): qty 2
  Filled 2015-09-16: qty 1

## 2015-09-16 MED ORDER — METFORMIN HCL 850 MG PO TABS
850.0000 mg | ORAL_TABLET | Freq: Two times a day (BID) | ORAL | Status: DC
  Administered 2015-09-16 – 2015-09-19 (×7): 850 mg via ORAL
  Filled 2015-09-16 (×7): qty 1

## 2015-09-16 NOTE — ED Notes (Signed)
Report attempted, RN unable to get report at this time. RN to call back ASAP.
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
## 2015-09-16 NOTE — Progress Notes (Addendum)
Cardiologist: Dr. Serafina Royals  Subjective:   65 year old with history of atrial fibrillation, hypertension, heart failure, hyperlipidemia who unfortunately experienced a lawnmower accident in which left humerus fracture and rib fracture to place.  He has experienced 3 strokes when he was taken off Coumadin in the past. Left shoulder hematoma was expanding, became hypotensive.  Pulse was elevated in the 120s to 140s range.  Objective:  Vital Signs in the last 24 hours: Temp:  [98.2 F (36.8 C)-98.6 F (37 C)] 98.6 F (37 C) (08/20 0741) Pulse Rate:  [91-143] 141 (08/20 1058) Resp:  [14-35] 20 (08/20 0741) BP: (86-143)/(48-100) 122/57 (08/20 1058) SpO2:  [94 %-100 %] 95 % (08/20 0741) Weight:  [296 lb (134.3 kg)] 296 lb (134.3 kg) (08/19 1328)  Intake/Output from previous day: 08/19 0701 - 08/20 0700 In: 1050 [I.V.:1050] Out: 100 [Urine:100]   Physical Exam: General: Well developed, well nourished, in no acute distress. Head:  Normocephalic and atraumatic. Lungs: Clear to auscultation and percussion. Heart: Normal S1 and S2.  No murmur, rubs or gallops.  Abdomen: soft, non-tender, positive bowel sounds. Extremities: No clubbing or cyanosis. No edema. Neurologic: Alert and oriented x 3.    Lab Results:  Recent Labs  09/15/15 2148 09/16/15 0813  WBC 7.7 6.3  HGB 12.3* 11.5*  PLT 136* 127*    Recent Labs  09/15/15 1355 09/15/15 1527 09/16/15 0813  NA 135 139 136  K 4.4 3.9 3.5  CL 106 103 108  CO2 17*  --  20*  GLUCOSE 267* 251* 206*  BUN 7 6 8   CREATININE 0.89 0.90 1.03   No results for input(s): TROPONINI in the last 72 hours.  Invalid input(s): CK, MB Hepatic Function Panel  Recent Labs  09/16/15 0813  PROT 5.4*  ALBUMIN 2.7*  AST 39  ALT 23  ALKPHOS 45  BILITOT 1.5*   No results for input(s): CHOL in the last 72 hours. No results for input(s): PROTIME in the last 72 hours.  Imaging: Dg Chest 2 View  Result Date:  09/15/2015 CLINICAL DATA:  Low more fell onto patient. Severe left shoulder pain. EXAM: CHEST  2 VIEW COMPARISON:  11/11/2014 FINDINGS: Cardiac silhouette is mildly enlarged. No mediastinal or hilar masses or evidence of adenopathy. Lung volumes are low. Lungs are clear. No pleural effusion or pneumothorax. Stable well-positioned left anterior chest wall pacemaker. Bony thorax is demineralized. Humeral head projects over the posterior margin of the glenoid. It may be subluxed posteriorly. Posterior dislocation is possible and not excluded based on the limited views of the shoulder obtained on the shoulder radiographs. IMPRESSION: No acute cardiopulmonary disease. Electronically Signed   By: Lajean Manes M.D.   On: 09/15/2015 14:44   Ct Head Wo Contrast  Result Date: 09/15/2015 CLINICAL DATA:  64 year old male status post blunt trauma, was run over struck by tractor. Initial encounter. EXAM: CT HEAD WITHOUT CONTRAST CT CERVICAL SPINE WITHOUT CONTRAST TECHNIQUE: Multidetector CT imaging of the head and cervical spine was performed following the standard protocol without intravenous contrast. Multiplanar CT image reconstructions of the cervical spine were also generated. COMPARISON:  Head CT without contrast 07/11/2015 and earlier. FINDINGS: CT HEAD FINDINGS No scalp hematoma identified. Negative visualized orbit and face soft tissues. Visualized paranasal sinuses and mastoids are stable and well pneumatized. Calvarium intact. Chronic right PCA infarct with unchanged right occipital lobe encephalomalacia. Elsewhere, Gray-white matter differentiation is within normal limits throughout the brain. No midline shift, ventriculomegaly, mass effect, evidence of mass lesion, intracranial  hemorrhage or evidence of cortically based acute infarction. CT CERVICAL SPINE FINDINGS Quantum mottle artifact from large body habitus. Mild reversal of cervical lordosis. Visualized skull base is intact. No atlanto-occipital  dissociation. Cervicothoracic junction alignment is within normal limits. Bilateral posterior element alignment is within normal limits. Bulky anterior cervical spine endplate spurring. Moderate to severe upper cervical facet degeneration greater on the left. No acute cervical spine fracture identified. Visible upper thoracic levels appear intact. There is a partially visible severe fracture about the proximal left humerus with surrounding intramuscular and subcutaneous hematoma. Retropharyngeal course of the cervical carotids. Otherwise negative noncontrast neck soft tissues. Partially visible left chest pacemaker device. There is a nondisplaced fracture of the left anterolateral third rib (series 209, image 101). No pneumothorax or pleural effusion in the lung apices. Mediastinal lipomatosis. IMPRESSION: 1.  No acute intracranial injury.  Chronic right PCA infarct. 2. No acute fracture or listhesis identified in the cervical spine. Ligamentous injury is not excluded. 3. Partially visible comminuted proximal left humerus fracture with intramuscular and subcutaneous hematoma. 4. Nondisplaced fracture left anterolateral third rib. 5. Negative visualized lung apices. CT chest abdomen and pelvis from today reported separately. Electronically Signed   By: Genevie Ann M.D.   On: 09/15/2015 17:32   Ct Chest W Contrast  Result Date: 09/15/2015 CLINICAL DATA:  Run over by a lawnmower. Trauma to the left shoulder region and chest. EXAM: CT CHEST, ABDOMEN, AND PELVIS WITH CONTRAST TECHNIQUE: Multidetector CT imaging of the chest, abdomen and pelvis was performed following the standard protocol during bolus administration of intravenous contrast. CONTRAST:  100 cc Isovue-300 COMPARISON:  11/11/2014 FINDINGS: CT CHEST FINDINGS Mediastinum/Lymph Nodes: No traumatic finding. Pacemaker inserted from the left side. No mass or lymphadenopathy. Lungs/Pleura: Clear and normal. Musculoskeletal: Left humeral fracture described  elsewhere. No rib fracture seen. No fracture of the scapula. No fracture of the thoracic spine. Ordinary spondylosis. No fracture of the sternum. CT ABDOMEN PELVIS FINDINGS Hepatobiliary: Chronic changes of cirrhosis as described previously. No evidence of focal liver lesion by CT. No calcified gallstones. Pancreas: Normal Spleen: Normal.  No injury. Adrenals/Urinary Tract: Normal adrenal glands. Few areas of renal scarring without evidence of cyst, mass, stone or hydronephrosis. No renal injury. Both kidneys function. Stomach/Bowel: No evidence of bowel injury. No focal bowel pathology. Vascular/Lymphatic: Aortic atherosclerosis. No aneurysm. No evidence of vascular injury. IVC is normal. No retroperitoneal lymphadenopathy. Reproductive: Bladder, prostate gland and seminal vesicles are unremarkable. Other: None. Musculoskeletal: Chronic degenerative changes throughout the lower thoracic and lumbar spine, with mild chronic superior endplate depression at Y40 and L2. IMPRESSION: Left humeral fracture. No other acute or traumatic finding in the region imaged. Cirrhosis of the liver. Aortic atherosclerosis. Electronically Signed   By: Nelson Chimes M.D.   On: 09/15/2015 17:33   Ct Cervical Spine Wo Contrast  Result Date: 09/15/2015 CLINICAL DATA:  65 year old male status post blunt trauma, was run over struck by tractor. Initial encounter. EXAM: CT HEAD WITHOUT CONTRAST CT CERVICAL SPINE WITHOUT CONTRAST TECHNIQUE: Multidetector CT imaging of the head and cervical spine was performed following the standard protocol without intravenous contrast. Multiplanar CT image reconstructions of the cervical spine were also generated. COMPARISON:  Head CT without contrast 07/11/2015 and earlier. FINDINGS: CT HEAD FINDINGS No scalp hematoma identified. Negative visualized orbit and face soft tissues. Visualized paranasal sinuses and mastoids are stable and well pneumatized. Calvarium intact. Chronic right PCA infarct with  unchanged right occipital lobe encephalomalacia. Elsewhere, Gray-white matter differentiation is within normal limits  throughout the brain. No midline shift, ventriculomegaly, mass effect, evidence of mass lesion, intracranial hemorrhage or evidence of cortically based acute infarction. CT CERVICAL SPINE FINDINGS Quantum mottle artifact from large body habitus. Mild reversal of cervical lordosis. Visualized skull base is intact. No atlanto-occipital dissociation. Cervicothoracic junction alignment is within normal limits. Bilateral posterior element alignment is within normal limits. Bulky anterior cervical spine endplate spurring. Moderate to severe upper cervical facet degeneration greater on the left. No acute cervical spine fracture identified. Visible upper thoracic levels appear intact. There is a partially visible severe fracture about the proximal left humerus with surrounding intramuscular and subcutaneous hematoma. Retropharyngeal course of the cervical carotids. Otherwise negative noncontrast neck soft tissues. Partially visible left chest pacemaker device. There is a nondisplaced fracture of the left anterolateral third rib (series 209, image 101). No pneumothorax or pleural effusion in the lung apices. Mediastinal lipomatosis. IMPRESSION: 1.  No acute intracranial injury.  Chronic right PCA infarct. 2. No acute fracture or listhesis identified in the cervical spine. Ligamentous injury is not excluded. 3. Partially visible comminuted proximal left humerus fracture with intramuscular and subcutaneous hematoma. 4. Nondisplaced fracture left anterolateral third rib. 5. Negative visualized lung apices. CT chest abdomen and pelvis from today reported separately. Electronically Signed   By: Genevie Ann M.D.   On: 09/15/2015 17:32   Ct Abdomen Pelvis W Contrast  Result Date: 09/15/2015 CLINICAL DATA:  Run over by a lawnmower. Trauma to the left shoulder region and chest. EXAM: CT CHEST, ABDOMEN, AND PELVIS WITH  CONTRAST TECHNIQUE: Multidetector CT imaging of the chest, abdomen and pelvis was performed following the standard protocol during bolus administration of intravenous contrast. CONTRAST:  100 cc Isovue-300 COMPARISON:  11/11/2014 FINDINGS: CT CHEST FINDINGS Mediastinum/Lymph Nodes: No traumatic finding. Pacemaker inserted from the left side. No mass or lymphadenopathy. Lungs/Pleura: Clear and normal. Musculoskeletal: Left humeral fracture described elsewhere. No rib fracture seen. No fracture of the scapula. No fracture of the thoracic spine. Ordinary spondylosis. No fracture of the sternum. CT ABDOMEN PELVIS FINDINGS Hepatobiliary: Chronic changes of cirrhosis as described previously. No evidence of focal liver lesion by CT. No calcified gallstones. Pancreas: Normal Spleen: Normal.  No injury. Adrenals/Urinary Tract: Normal adrenal glands. Few areas of renal scarring without evidence of cyst, mass, stone or hydronephrosis. No renal injury. Both kidneys function. Stomach/Bowel: No evidence of bowel injury. No focal bowel pathology. Vascular/Lymphatic: Aortic atherosclerosis. No aneurysm. No evidence of vascular injury. IVC is normal. No retroperitoneal lymphadenopathy. Reproductive: Bladder, prostate gland and seminal vesicles are unremarkable. Other: None. Musculoskeletal: Chronic degenerative changes throughout the lower thoracic and lumbar spine, with mild chronic superior endplate depression at F79 and L2. IMPRESSION: Left humeral fracture. No other acute or traumatic finding in the region imaged. Cirrhosis of the liver. Aortic atherosclerosis. Electronically Signed   By: Nelson Chimes M.D.   On: 09/15/2015 17:33   Dg Shoulder Left  Result Date: 09/15/2015 CLINICAL DATA:  Lawnmower fell onto patient, severe left shoulder pain.Axillary view was not obtained due to patients condition EXAM: LEFT SHOULDER - 2+ VIEW COMPARISON:  None. FINDINGS: There is a fracture across the proximal left humeral metaphysis.  Fracture is displaced mildly comminuted. The shaft fracture component has migrated superiorly and overlaps the humeral head component, most likely anterior to the humeral head component. Displacement is approximately 4 cm. The humeral head appears aligned with the glenoid on the limited views acquired. AC joint is normally aligned. Bones are diffusely demineralized. IMPRESSION: Displaced fracture the proximal left humerus,  across the metaphysis with comminuted components. No convincing dislocation on the views acquired. Electronically Signed   By: Lajean Manes M.D.   On: 09/15/2015 14:42   Personally viewed.   Telemetry: Atrial fibrillation with rapid ventricular response heart rate 140s to 120s, right bundle branch block, ST depression V5, V6 Personally viewed.   EKG:  Atrial fibrillation with rapid ventricular response rate 123 Personally viewed.  Cardiac Studies:    Meds: Scheduled Meds: . glipiZIDE  2.5 mg Oral BID AC  . lisinopril  20 mg Oral Daily  . metFORMIN  850 mg Oral BID WC  . metoprolol  100 mg Oral BID   Continuous Infusions: . 0.9 % NaCl with KCl 20 mEq / L 75 mL/hr at 09/16/15 1105   PRN Meds:.morphine injection, ondansetron **OR** ondansetron (ZOFRAN) IV, oxyCODONE  Assessment/Plan:  Active Problems:   Contact with powered lawnmower as cause of accidental injury at home as place of occurrence  Atrial fibrillation with rapid ventricular response, lawnmower accident. Persistent/chronic atrial fibrillation.  - Dr. Judie Grieve note reviewed. Cardiac consultation  - Agree that worsening heart rate control is likely multifactorial from both pain as well as blood loss.  - Agree that if hematoma is worsening/bleeding occurring that Coumadin should be held and if no further bleeding occurs resume Coumadin. Heparin drip can be used as a bridge if necessary.  - Prior history of stroke when off of Coumadin.  - Continue with metoprolol 100 mg twice a day as he was taking at home.  -  If necessary, we can add diltiazem 30 mg by mouth every 6 hours to assist with rate control. I think that as pain continues to improve, heart rate will follow. If blood pressure gets too low, we could transiently use amiodarone. Has pacemaker for backup.  - Dr. Nehemiah Massed follows in [place].   - He states that heart rates in the 120s are not unusual for him.  Pacemaker  - Dual-chamber pacemaker. He states that this was placed originally on medication we use to rate control his atrial fibrillation for protection to keep it greater than 60 bpm. He says that the battery has been depleted on the pacemaker and no plans for removal or replacement since he has not demonstrated any further bradycardia.  Chronic heart failure unknown EF  - Appears euvolemic.  Left proximal humerus fracture  - Seen by orthopedic surgery  - Follow-up 7-10 days after discharge with Dr. Alma Friendly  - Hematoma appears stable after review of surgery note.    Geovonni Meyerhoff 09/16/2015, 11:08 AM

## 2015-09-17 DIAGNOSIS — I482 Chronic atrial fibrillation: Secondary | ICD-10-CM

## 2015-09-17 LAB — GLUCOSE, CAPILLARY
Glucose-Capillary: 200 mg/dL — ABNORMAL HIGH (ref 65–99)
Glucose-Capillary: 244 mg/dL — ABNORMAL HIGH (ref 65–99)
Glucose-Capillary: 228 mg/dL — ABNORMAL HIGH (ref 65–99)
Glucose-Capillary: 188 mg/dL — ABNORMAL HIGH (ref 65–99)

## 2015-09-17 LAB — PROTIME-INR
INR: 1.88
Prothrombin Time: 21.9 seconds — ABNORMAL HIGH (ref 11.4–15.2)

## 2015-09-17 MED ORDER — WARFARIN SODIUM 5 MG PO TABS
7.5000 mg | ORAL_TABLET | Freq: Every day | ORAL | Status: DC
  Administered 2015-09-17: 7.5 mg via ORAL
  Filled 2015-09-17: qty 2

## 2015-09-17 MED ORDER — IOPAMIDOL (ISOVUE-300) INJECTION 61%
100.0000 mL | Freq: Once | INTRAVENOUS | Status: AC | PRN
  Administered 2015-09-15: 100 mL via INTRAVENOUS

## 2015-09-17 MED ORDER — DILTIAZEM HCL 60 MG PO TABS
30.0000 mg | ORAL_TABLET | Freq: Four times a day (QID) | ORAL | Status: DC
  Administered 2015-09-17 – 2015-09-18 (×5): 30 mg via ORAL
  Filled 2015-09-17 (×5): qty 1

## 2015-09-17 MED ORDER — WARFARIN - PHYSICIAN DOSING INPATIENT
Freq: Every day | Status: DC
  Administered 2015-09-17 – 2015-09-18 (×2)

## 2015-09-17 MED ORDER — INSULIN ASPART 100 UNIT/ML ~~LOC~~ SOLN
0.0000 [IU] | Freq: Three times a day (TID) | SUBCUTANEOUS | Status: DC
  Administered 2015-09-17 – 2015-09-18 (×5): 7 [IU] via SUBCUTANEOUS
  Administered 2015-09-19 (×2): 4 [IU] via SUBCUTANEOUS

## 2015-09-17 MED ORDER — MORPHINE SULFATE (PF) 2 MG/ML IV SOLN
2.0000 mg | INTRAVENOUS | Status: DC | PRN
  Administered 2015-09-17 (×2): 4 mg via INTRAVENOUS
  Administered 2015-09-18 (×2): 2 mg via INTRAVENOUS
  Filled 2015-09-17: qty 1
  Filled 2015-09-17 (×2): qty 2
  Filled 2015-09-17: qty 1

## 2015-09-17 NOTE — Progress Notes (Signed)
    Subjective:  Denies CP or dyspnea   Objective:  Vitals:   09/17/15 0601 09/17/15 0602 09/17/15 0800 09/17/15 1229  BP:   (!) 126/97 101/70  Pulse:  (!) 133 (!) 144 (!) 102  Resp: 18 (!) 23 14 (!) 22  Temp:   98.9 F (37.2 C) 98.3 F (36.8 C)  TempSrc:   Oral Oral  SpO2:  97% 99% 98%  Weight:      Height:        Intake/Output from previous day:  Intake/Output Summary (Last 24 hours) at 09/17/15 1319 Last data filed at 09/17/15 0757  Gross per 24 hour  Intake              360 ml  Output                0 ml  Net              360 ml    Physical Exam: Physical exam: Well-developed obese in no acute distress.  Skin is warm and dry.  HEENT is with abrations Neck is supple.  Chest is clear to auscultation with normal expansion.  Cardiovascular exam is irregular Abdominal exam nontender or distended. No masses palpated. Extremities show no edema. Left shoulder hematoma neuro grossly intact    Lab Results: Basic Metabolic Panel:  Recent Labs  09/15/15 1355 09/15/15 1527 09/16/15 0813  NA 135 139 136  K 4.4 3.9 3.5  CL 106 103 108  CO2 17*  --  20*  GLUCOSE 267* 251* 206*  BUN 7 6 8   CREATININE 0.89 0.90 1.03  CALCIUM 9.0  --  8.1*   CBC:  Recent Labs  09/15/15 2148 09/16/15 0813  WBC 7.7 6.3  HGB 12.3* 11.5*  HCT 36.2* 33.2*  MCV 98.9 99.4  PLT 136* 127*     Assessment/Plan:  1 Permanent atrial fibrillation-HR elevated; BP borderline; hold lisinopril; continue metoprolol; add low dose cardizem; would add IV heparin when ok with surgery until INR therapeutic (on hold for shoulder hematoma). 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 2 h/o pacemaker 3 trauma-per surgery  Kirk Ruths 09/17/2015, 1:19 PM

## 2015-09-17 NOTE — Evaluation (Addendum)
Physical Therapy Evaluation Patient Details Name: Christian Berg MRN: [id number] DOB: [birth year] Today's Date: 09/17/2015   History of Present Illness  65 year old with history of atrial fibrillation, hypertension, heart failure, hyperlipidemia who unfortunately experienced a lawnmower accident in which left humerus fracture and rib fracture to place.  Clinical Impression  Pt admitted with above diagnosis. Pt currently with functional limitations due to the deficits listed below (see PT Problem List). Pt was able to ambulate on unit without LOB.  Pt steady overall but did discuss that he may want to use a cane to incr steadiness. Paged MD to ask for OT consult to discuss techniques for ADLs.  Will follow acutely.  Pt will benefit from skilled PT to increase their independence and safety with mobility to allow discharge to the venue listed below.      Follow Up Recommendations Supervision/Assistance - 24 hour;No PT follow up    Equipment Recommendations  Other (comment) (may need a tub bench)    Recommendations for Other Services       Precautions / Restrictions Precautions Precautions: Fall Required Braces or Orthoses: Sling (on at all times except to clean armpit) Restrictions Weight Bearing Restrictions: Yes LUE Weight Bearing: Non weight bearing      Mobility  Bed Mobility Overal bed mobility: Needs Assistance;+2 for physical assistance Bed Mobility: Supine to Sit     Supine to sit: Mod assist;+2 for physical assistance     General bed mobility comments: Needed assist for elevation of trunk due to left shoulder in sling  Transfers Overall transfer level: Needs assistance Equipment used: None Transfers: Sit to/from Stand Sit to Stand: Min guard         General transfer comment: Pt able to stand up with guard assist.    Ambulation/Gait Ambulation/Gait assistance: Min guard Ambulation Distance (Feet): 320 Feet Assistive device: None Gait Pattern/deviations:  Step-through pattern;Decreased stride length;Wide base of support   Gait velocity interpretation: Below normal speed for age/gender General Gait Details: Pt was able to ambulate around unit with good balance overall.  Pt did not lose balance throughout the walk.    Stairs            Wheelchair Mobility    Modified Rankin (Stroke Patients Only)       Balance Overall balance assessment: Needs assistance;History of Falls Sitting-balance support: No upper extremity supported;Feet supported Sitting balance-Leahy Scale: Fair     Standing balance support: No upper extremity supported;During functional activity Standing balance-Leahy Scale: Fair Standing balance comment: can stand statically and balance without assist.                              Pertinent Vitals/Pain Pain Assessment: Faces Faces Pain Scale: Hurts even more Pain Location: left shoulder and ribs Pain Descriptors / Indicators: Aching;Grimacing;Guarding Pain Intervention(s): Limited activity within patient's tolerance;Monitored during session;Premedicated before session;Repositioned;Ice applied  96-113 bpm but went as high as 140 bpm with activity, 89-91% on RA.       Home Living Family/patient expects to be discharged to:: Private residence Living Arrangements: Spouse/significant other Available Help at Discharge: Family;Available 24 hours/day Type of Home: House       Home Layout: One level Home Equipment: Cane - single point (lift chair) Additional Comments: reports he has slept in lift chair since 2008  Reports 2 falls a month or so ago letting wife's cat out at night.   Prior Function Level of Independence: Independent  Comments: {Pt reports two falls at night      Hand Dominance        Extremity/Trunk Assessment   Upper Extremity Assessment: Defer to OT evaluation           Lower Extremity Assessment: Generalized weakness      Cervical / Trunk Assessment:  Normal  Communication   Communication: No difficulties  Cognition Arousal/Alertness: Awake/alert Behavior During Therapy: WFL for tasks assessed/performed Overall Cognitive Status: Within Functional Limits for tasks assessed                      General Comments General comments (skin integrity, edema, etc.): Adjusted the sling once pt sitting .    Exercises        Assessment/Plan    PT Assessment Patient needs continued PT services  PT Diagnosis Generalized weakness;Acute pain   PT Problem List Decreased activity tolerance;Decreased balance;Decreased mobility;Decreased knowledge of use of DME;Decreased safety awareness;Decreased knowledge of precautions;Pain  PT Treatment Interventions DME instruction;Gait training;Stair training;Functional mobility training;Therapeutic activities;Therapeutic exercise;Balance training;Patient/family education   PT Goals (Current goals can be found in the Care Plan section) Acute Rehab PT Goals Patient Stated Goal: to go home PT Goal Formulation: With patient Time For Goal Achievement: 09/24/15 Potential to Achieve Goals: Good    Frequency Min 3X/week   Barriers to discharge        Co-evaluation               End of Session Equipment Utilized During Treatment: Gait belt Activity Tolerance: Patient limited by fatigue Patient left: in chair;with call bell/phone within reach;with chair alarm set Nurse Communication: Mobility status         Time: 5366-4403 PT Time Calculation (min) (ACUTE ONLY): 24 min   Charges:   PT Evaluation $PT Eval Moderate Complexity: 1 Procedure PT Treatments $Gait Training: 8-22 mins   PT G CodesDenice Paradise 2015-10-14, 1:47 PM [place]ust,PT Acute Rehabilitation [phone] (pager)

## 2015-09-17 NOTE — Progress Notes (Signed)
   Subjective:  Patient reports pain as mild to moderate.  C/o L shoulder pain.  Objective:   VITALS:   Vitals:   09/17/15 0600 09/17/15 0601 09/17/15 0602 09/17/15 0800  BP: 111/66   (!) 126/97  Pulse: (!) 132  (!) 133 (!) 144  Resp: (!) 26 18 (!) 23 14  Temp:    98.9 F (37.2 C)  TempSrc:    Oral  SpO2: 97%  97% 99%  Weight:      Height:        NAD LUE: sling intact. Hematoma and fx blisters stable. (+) AIN, PIN, U. SILT Ax/MC/R/U/M. 2+ radial.  Lab Results  Component Value Date   WBC 6.3 09/16/2015   HGB 11.5 (L) 09/16/2015   HCT 33.2 (L) 09/16/2015   MCV 99.4 09/16/2015   PLT 127 (L) 09/16/2015   BMET    Component Value Date/Time   NA 136 09/16/2015 0813   NA 140 05/24/2015 1058   NA 142 09/16/2013 0356   K 3.5 09/16/2015 0813   K 3.4 (L) 09/16/2013 0356   CL 108 09/16/2015 0813   CL 107 09/16/2013 0356   CO2 20 (L) 09/16/2015 0813   CO2 24 09/16/2013 0356   GLUCOSE 206 (H) 09/16/2015 0813   GLUCOSE 232 (H) 09/16/2013 0356   BUN 8 09/16/2015 0813   BUN 9 05/24/2015 1058   BUN 5 (L) 09/16/2013 0356   CREATININE 1.03 09/16/2015 0813   CREATININE 0.89 09/17/2013 0458   CALCIUM 8.1 (L) 09/16/2015 0813   CALCIUM 7.7 (L) 09/16/2013 0356   GFRNONAA >60 09/16/2015 0813   GFRNONAA >60 09/17/2013 0458   GFRAA >60 09/16/2015 0813   GFRAA >60 09/17/2013 0458     Assessment/Plan:     Active Problems:   Contact with powered lawnmower as cause of accidental injury at home as place of occurrence   A/P: L proximal humerus fx. Coumadin usage. NWB LUE Sling at all times. May don/doff for axillary care Follow up 7-10 days after discharge with Dr. Gus Rankin, Horald Pollen 09/17/2015, 12:25 PM   Rod Can, MD Cell [phone]

## 2015-09-17 NOTE — Plan of Care (Signed)
Problem: Safety: Goal: Ability to remain free from injury will improve Outcome: Progressing Patient calling for assistance appropriately. Ambulated in the halls with PT without issue.   Problem: Pain Managment: Goal: General experience of comfort will improve Outcome: Progressing Receiving prn pain medications as ordered. Using PO meds more than IV meds.   Problem: Skin Integrity: Goal: Risk for impaired skin integrity will decrease Outcome: Progressing No skin breakdown at this time.   Problem: Tissue Perfusion: Goal: Risk factors for ineffective tissue perfusion will decrease Outcome: Progressing SCD's being used. Coumadin restarted.  Problem: Nutrition: Goal: Adequate nutrition will be maintained Outcome: Completed/Met Date Met: 09/17/15 Great appetite.

## 2015-09-18 LAB — CBC WITH DIFFERENTIAL/PLATELET
Basophils Absolute: 0 10*3/uL (ref 0.0–0.1)
Basophils Relative: 0 %
Eosinophils Absolute: 0.2 10*3/uL (ref 0.0–0.7)
Eosinophils Relative: 4 %
HCT: 31.2 % — ABNORMAL LOW (ref 39.0–52.0)
Hemoglobin: 10.6 g/dL — ABNORMAL LOW (ref 13.0–17.0)
Lymphocytes Relative: 25 %
Lymphs Abs: 1.7 10*3/uL (ref 0.7–4.0)
MCH: 34 pg (ref 26.0–34.0)
MCHC: 34 g/dL (ref 30.0–36.0)
MCV: 100 fL (ref 78.0–100.0)
Monocytes Absolute: 0.8 10*3/uL (ref 0.1–1.0)
Monocytes Relative: 11 %
Neutro Abs: 4 10*3/uL (ref 1.7–7.7)
Neutrophils Relative %: 60 %
Platelets: 141 10*3/uL — ABNORMAL LOW (ref 150–400)
RBC: 3.12 MIL/uL — ABNORMAL LOW (ref 4.22–5.81)
RDW: 13.4 % (ref 11.5–15.5)
WBC: 6.7 10*3/uL (ref 4.0–10.5)

## 2015-09-18 LAB — BLOOD PRODUCT ORDER (VERBAL) VERIFICATION

## 2015-09-18 LAB — GLUCOSE, CAPILLARY
Glucose-Capillary: 213 mg/dL — ABNORMAL HIGH (ref 65–99)
Glucose-Capillary: 205 mg/dL — ABNORMAL HIGH (ref 65–99)
Glucose-Capillary: 226 mg/dL — ABNORMAL HIGH (ref 65–99)
Glucose-Capillary: 212 mg/dL — ABNORMAL HIGH (ref 65–99)

## 2015-09-18 LAB — PROTIME-INR
INR: 1.51
Prothrombin Time: 18.4 seconds — ABNORMAL HIGH (ref 11.4–15.2)

## 2015-09-18 LAB — HEPARIN LEVEL (UNFRACTIONATED): Heparin Unfractionated: 0.2 IU/mL — ABNORMAL LOW (ref 0.30–0.70)

## 2015-09-18 MED ORDER — DILTIAZEM HCL 60 MG PO TABS
60.0000 mg | ORAL_TABLET | Freq: Three times a day (TID) | ORAL | Status: DC
  Administered 2015-09-18 – 2015-09-19 (×2): 60 mg via ORAL
  Filled 2015-09-18 (×2): qty 1

## 2015-09-18 MED ORDER — WARFARIN SODIUM 5 MG PO TABS
7.5000 mg | ORAL_TABLET | Freq: Every day | ORAL | Status: DC
  Administered 2015-09-18 – 2015-09-19 (×2): 7.5 mg via ORAL
  Filled 2015-09-18 (×2): qty 2

## 2015-09-18 MED ORDER — HEPARIN (PORCINE) IN NACL 100-0.45 UNIT/ML-% IJ SOLN
1450.0000 [IU]/h | INTRAMUSCULAR | Status: DC
  Administered 2015-09-18: 1200 [IU]/h via INTRAVENOUS
  Administered 2015-09-19: 1450 [IU]/h via INTRAVENOUS
  Filled 2015-09-18 (×2): qty 250

## 2015-09-18 MED ORDER — TRAMADOL HCL 50 MG PO TABS
50.0000 mg | ORAL_TABLET | Freq: Four times a day (QID) | ORAL | Status: DC
  Administered 2015-09-18 – 2015-09-19 (×5): 50 mg via ORAL
  Filled 2015-09-18 (×5): qty 1

## 2015-09-18 NOTE — Progress Notes (Signed)
    Subjective:  Denies CP or dyspnea   Objective:  Vitals:   09/18/15 0503 09/18/15 0800 09/18/15 1031 09/18/15 1205  BP: (!) 114/95 (!) 114/53 112/74 121/69  Pulse: (!) 102 93 (!) 111 92  Resp: 16 16  18   Temp: 99.3 F (37.4 C) 98.4 F (36.9 C)  98.5 F (36.9 C)  TempSrc: Oral Oral  Oral  SpO2: 99% 99%  100%  Weight:      Height:        Intake/Output from previous day:  Intake/Output Summary (Last 24 hours) at 09/18/15 1330 Last data filed at 09/18/15 1100  Gross per 24 hour  Intake           1165.2 ml  Output              675 ml  Net            490.2 ml    Physical Exam: Physical exam: Well-developed obese in no acute distress.  Skin is warm and dry.  HEENT is with abrations Neck is supple.  Chest is clear to auscultation with normal expansion.  Cardiovascular exam is irregular Abdominal exam nontender or distended. No masses palpated. Extremities show no edema. Left shoulder hematoma neuro grossly intact    Lab Results: Basic Metabolic Panel:  Recent Labs  09/15/15 1355 09/15/15 1527 09/16/15 0813  NA 135 139 136  K 4.4 3.9 3.5  CL 106 103 108  CO2 17*  --  20*  GLUCOSE 267* 251* 206*  BUN 7 6 8   CREATININE 0.89 0.90 1.03  CALCIUM 9.0  --  8.1*   CBC:  Recent Labs  09/16/15 0813 09/18/15 0623  WBC 6.3 6.7  NEUTROABS  --  4.0  HGB 11.5* 10.6*  HCT 33.2* 31.2*  MCV 99.4 100.0  PLT 127* 141*     Assessment/Plan:  1 Permanent atrial fibrillation-HR elevated; continue metoprolol; change cardizem to 60 mg every 8 hrs; heparin/coumadin initiated; INR will need to be therapeutic prior to heparin being DCed (or outpt lovenox). 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 2 h/o pacemaker 3 trauma-per surgery  Kirk Ruths 09/18/2015, 1:30 PM

## 2015-09-18 NOTE — Progress Notes (Signed)
Trauma Service Note  Subjective: Patient doing very well.  Did ambulate yesterday.  Objective: Vital signs in last 24 hours: Temp:  [98.3 F (36.8 C)-99.8 F (37.7 C)] 98.4 F (36.9 C) (08/22 0800) Pulse Rate:  [93-108] 93 (08/22 0800) Resp:  [16-22] 16 (08/22 0800) BP: (101-123)/(53-95) 114/53 (08/22 0800) SpO2:  [98 %-100 %] 99 % (08/22 0800) Last BM Date: 09/15/15  Intake/Output from previous day: 08/21 0701 - 08/22 0700 In: 940 [P.O.:940] Out: 675 [Urine:675] Intake/Output this shift: No intake/output data recorded.  General: No acute distress.  Sitting up and doing well.  Lungs: Clear to auscultation.  Abd: Benign  Extremities: Left arm in sling.  Will follow-up with Dr. Veverly Fells as outpatient.  Neuro: Intact  Lab Results: CBC   Recent Labs  09/16/15 0813 09/18/15 0623  WBC 6.3 6.7  HGB 11.5* 10.6*  HCT 33.2* 31.2*  PLT 127* 141*   BMET  Recent Labs  09/15/15 1355 09/15/15 1527 09/16/15 0813  NA 135 139 136  K 4.4 3.9 3.5  CL 106 103 108  CO2 17*  --  20*  GLUCOSE 267* 251* 206*  BUN 7 6 8   CREATININE 0.89 0.90 1.03  CALCIUM 9.0  --  8.1*   PT/INR  Recent Labs  09/17/15 0220 09/18/15 0623  LABPROT 21.9* 18.4*  INR 1.88 1.51   ABG No results for input(s): PHART, HCO3 in the last 72 hours.  Invalid input(s): PCO2, PO2  Studies/Results: No results found.  Anti-infectives: Anti-infectives    Start     Dose/Rate Route Frequency Ordered Stop   09/15/15 2015  ceFAZolin (ANCEF) IVPB 1 g/50 mL premix     1 g 100 mL/hr over 30 Minutes Intravenous  Once 09/15/15 2002 09/15/15 2128      Assessment/Plan: s/p  Advance diet Heparin drip per Cardiology until INR is up  Transfer to Telemetry.  LOS: 3 days   Kathryne Eriksson. Dahlia Bailiff, MD, FACS [phone] Trauma Surgeon 09/18/2015

## 2015-09-18 NOTE — Evaluation (Signed)
Occupational Therapy Evaluation Patient Details Name: Christian Berg MRN: [id number] DOB: Mar 12, 1950 Today's Date: 09/18/2015    History of Present Illness 65 year old with history of atrial fibrillation, hypertension, heart failure, hyperlipidemia who unfortunately experienced a lawnmower accident in which left humerus fracture and rib fracture to place.   Clinical Impression   Pt and wife educated in multiple uses and benefits of 3 in 1, compensatory strategies for ADL, sling use and positioning L UE. Verbalized understanding. Pt given incentive spirometer and instructed in use. Moving well. Will follow acutely.    Follow Up Recommendations  No OT follow up    Equipment Recommendations  3 in 1 bedside comode (extra wide)    Recommendations for Other Services       Precautions / Restrictions Precautions Required Braces or Orthoses: Sling (on at all times except for ADL) Restrictions Weight Bearing Restrictions: Yes LUE Weight Bearing: Non weight bearing      Mobility Bed Mobility Overal bed mobility: Needs Assistance Bed Mobility: Supine to Sit     Supine to sit: Min assist     General bed mobility comments: Needed assist for elevation of trunk due to left shoulder in sling  Transfers Overall transfer level: Needs assistance Equipment used: None Transfers: Sit to/from Stand Sit to Stand: Supervision         General transfer comment: no physical assist, supervision for safety, elevated recliner with pillows due to pt being used to a lift chair    Balance     Sitting balance-Leahy Scale: Good       Standing balance-Leahy Scale: Fair                              ADL Overall ADL's : Needs assistance/impaired Eating/Feeding: Set up;Sitting   Grooming: Wash/dry hands;Standing;Supervision/safety   Upper Body Bathing: Moderate assistance;Sitting   Lower Body Bathing: Moderate assistance;Sit to/from stand   Upper Body Dressing : Moderate  assistance;Sitting   Lower Body Dressing: Moderate assistance;Sit to/from stand Lower Body Dressing Details (indicate cue type and reason): does not plan to wear socks Toilet Transfer: RW;BSC;Ambulation;Supervision/safety (BSC over toilet)         Tub/Shower Transfer Details (indicate cue type and reason): recommended use of BSC as shower seat and supervision of wife for transfer Functional mobility during ADLs: Supervision/safety General ADL Comments: Educated pt and wife in compensatory strategies for ADL, positioning R UE in chair and bed and sling use.     Vision     Perception     Praxis      Pertinent Vitals/Pain Pain Assessment: 0-10 Pain Score: 6  Pain Location: L UE Pain Descriptors / Indicators: Aching Pain Intervention(s): Repositioned;Monitored during session     Hand Dominance Right   Extremity/Trunk Assessment Upper Extremity Assessment Upper Extremity Assessment: LUE deficits/detail LUE Deficits / Details: intact hand movement, not otherwise assessed LUE: Unable to fully assess due to immobilization LUE Coordination: decreased gross motor   Lower Extremity Assessment Lower Extremity Assessment: Defer to PT evaluation   Cervical / Trunk Assessment Cervical / Trunk Assessment: Normal (hx of back pain)   Communication Communication Communication: No difficulties   Cognition Arousal/Alertness: Awake/alert Behavior During Therapy: WFL for tasks assessed/performed Overall Cognitive Status: Within Functional Limits for tasks assessed                     General Comments       Exercises  Shoulder Instructions      Home Living Family/patient expects to be discharged to:: Private residence Living Arrangements: Spouse/significant other Available Help at Discharge: Family;Available 24 hours/day Type of Home: House       Home Layout: One level     Bathroom Shower/Tub: Teacher, early years/pre: Standard     Home  Equipment: Cane - single point;Other (comment) (lift chair)   Additional Comments: reports he has slept in lift chair since 2008 due to back pain      Prior Functioning/Environment Level of Independence: Independent             OT Diagnosis: Generalized weakness;Acute pain   OT Problem List: Decreased knowledge of use of DME or AE;Increased edema;Impaired UE functional use;Obesity;Decreased activity tolerance;Impaired balance (sitting and/or standing);Decreased coordination   OT Treatment/Interventions: Self-care/ADL training;DME and/or AE instruction;Patient/family education    OT Goals(Current goals can be found in the care plan section) Acute Rehab OT Goals Patient Stated Goal: to go home OT Goal Formulation: With patient Time For Goal Achievement: 09/25/15 Potential to Achieve Goals: Good  OT Frequency: Min 2X/week   Barriers to D/C:            Co-evaluation              End of Session    Activity Tolerance: Patient tolerated treatment well Patient left: in chair;with call bell/phone within reach;with family/visitor present   Time: 8280-0349 OT Time Calculation (min): 37 min Charges:  OT General Charges $OT Visit: 1 Procedure OT Evaluation $OT Eval Moderate Complexity: 1 Procedure OT Treatments $Self Care/Home Management : 8-22 mins G-Codes:    Malka So 09/18/2015, 4:37 PM  [phone]

## 2015-09-18 NOTE — Progress Notes (Signed)
Physical Therapy Treatment Patient Details Name: Christian Berg MRN: [id number] DOB: 1950/12/13 Today's Date: 09/18/2015    History of Present Illness 65 year old with history of atrial fibrillation, hypertension, heart failure, hyperlipidemia who unfortunately experienced a lawnmower accident in which left humerus fracture and rib fracture to place.    PT Comments    Patient seen for mobility progression. Performed stairs and ambulation, receptive to education. Goals met, no further acute needs, will sign off.  Follow Up Recommendations  Supervision/Assistance - 24 hour;No PT follow up     Equipment Recommendations  None recommended by PT    Recommendations for Other Services       Precautions / Restrictions Precautions Required Braces or Orthoses: Sling (on at all times except for ADL) Restrictions Weight Bearing Restrictions: Yes LUE Weight Bearing: Non weight bearing    Mobility  Bed Mobility Overal bed mobility: Needs Assistance Bed Mobility: Supine to Sit     Supine to sit: Min assist     General bed mobility comments: Needed assist for elevation of trunk due to left shoulder in sling  Transfers Overall transfer level: Needs assistance Equipment used: None Transfers: Sit to/from Stand Sit to Stand: Supervision         General transfer comment: no physical assist required supervision for safety  Ambulation/Gait Ambulation/Gait assistance: Independent Ambulation Distance (Feet): 340 Feet Assistive device: None Gait Pattern/deviations: WFL(Within Functional Limits) Gait velocity: initally decreased but improved to [facility]   General Gait Details: patient steady with ambulation, initial supervision with education IR:WERX speed due to alterred center of gravity, but progressed to independent without difficulty   Stairs Stairs: Yes Stairs assistance: Modified independent (Device/Increase time) Stair Management: One rail Left;Sideways Number of Stairs:  5 General stair comments: discussed options for performance, patient able to perform sideways without cues. did use rail for safety.  Wheelchair Mobility    Modified Rankin (Stroke Patients Only)       Balance Overall balance assessment: History of Falls Sitting-balance support: No upper extremity supported Sitting balance-Leahy Scale: Good     Standing balance support: No upper extremity supported Standing balance-Leahy Scale: Fair                      Cognition Arousal/Alertness: Awake/alert Behavior During Therapy: WFL for tasks assessed/performed Overall Cognitive Status: Within Functional Limits for tasks assessed                      Exercises      General Comments General comments (skin integrity, edema, etc.): educated on mobility expectations, pain management and balance techniques.      Pertinent Vitals/Pain Pain Assessment: 0-10 Pain Score: 6  Pain Location: L UE Pain Descriptors / Indicators: Aching Pain Intervention(s): Repositioned;Monitored during session    Home Living Family/patient expects to be discharged to:: Private residence Living Arrangements: Spouse/significant other Available Help at Discharge: Family;Available 24 hours/day Type of Home: House     Home Layout: One level Home Equipment: Cane - single point;Other (comment) (lift chair) Additional Comments: reports he has slept in lift chair since 2008 due to back pain    Prior Function Level of Independence: Independent          PT Goals (current goals can now be found in the care plan section) Acute Rehab PT Goals Patient Stated Goal: to go home PT Goal Formulation: With patient Time For Goal Achievement: 09/24/15 Potential to Achieve Goals: Good Progress towards PT goals: Goals met/education completed,  patient discharged from PT    Frequency       PT Plan Other (comment) (no further acute needs)    Co-evaluation             End of Session    Activity Tolerance: Patient tolerated treatment well Patient left: in chair;with call bell/phone within reach;with family/visitor present     Time: 3612-2449 PT Time Calculation (min) (ACUTE ONLY): 12 min  Charges:  $Gait Training: 8-22 mins                    G CodesDuncan Dull 2015/09/29, 4:52 PM Alben Deeds, [place] DPT  [phone]

## 2015-09-18 NOTE — Progress Notes (Addendum)
ANTICOAGULATION CONSULT NOTE - Initial Consult  Pharmacy Consult for Heparin Indication: atrial fibrillation  Allergies  Allergen Reactions  . Sulfa Antibiotics Hives    Patient Measurements: Height: 5' 8"  (172.7 cm) Weight: 296 lb (134.3 kg) IBW/kg (Calculated) : 68.4 Heparin Dosing Weight: 102 kg  Vital Signs: Temp: 98.4 F (36.9 C) (08/22 0800) Temp Source: Oral (08/22 0800) BP: 114/53 (08/22 0800) Pulse Rate: 93 (08/22 0800)  Labs:  Recent Labs  09/15/15 1355 09/15/15 1527 09/15/15 2148 09/16/15 0813 09/17/15 0220 09/18/15 0623  HGB 14.1 13.6 12.3* 11.5*  --  10.6*  HCT 41.7 40.0 36.2* 33.2*  --  31.2*  PLT 120*  --  136* 127*  --  141*  LABPROT 25.5*  --   --  24.2* 21.9* 18.4*  INR 2.28  --   --  2.14 1.88 1.51  CREATININE 0.89 0.90  --  1.03  --   --     Estimated Creatinine Clearance: 94.6 mL/min (by C-G formula based on SCr of 1.03 mg/dL).   Medical History: Past Medical History:  Diagnosis Date  . A-fib ([place])   . CHF (congestive heart failure) ([place])   . Degenerative disc disease, lumbar    s/p injury  . Diabetes mellitus without complication ([place])   . Disseminated superficial actinic porokeratosis   . Dysrhythmia    A-FIB, palpatations  . Hypercholesteremia   . Hypertension   . Kidney stones   . Peripheral vascular disease ([place])    Carotid stenosis  . Presence of permanent cardiac pacemaker    Inactive  . TIA (transient ischemic attack) 2011   No deficits   Assessment:   65 yr old male admitted 09/15/15 after lawnmower accident, has fractured left humerus. No surgery, arm in sling.     On Coumadin prior to admission for afib and hx CVAs.   INR was therapeutic (2.28) on admission, but Coumadin held due to significant shoulder hematoma.   Coumadin resumed on 8/21 when INR 1.88.  INR down to 1.51 today. Adding heparin bridge.  He takes Coumadin in the morning at home, and only missed 1 dose on 8/20, since he had had his dose prior to admission  on 8/19.  Did not receive Vitamin K or [place].   Platelet count below normal range, but seems low stable.   Off home Aspirin 81 mg daily.    Home Coumadin regimen: 7.5 mg daily.  He reports sometimes taking 10 mg a few days a week in the past, if INR fell < 2  Goal of Therapy:  INR 2-3 Heparin level 0.3-0.5 units/ml Monitor platelets by anticoagulation protocol: Yes   Plan:    Begin heparin drip at 1200 units/hr (~12 units/kg/hr)   Heparin level ~6-8 hrs after drip begins.     Will target low-therapeutic heparin levels for now.      Coumadin 7.5 mg daily per MD.  Changed to am dosing vs 6pm, as at home.   Daily heparin level, CBC and PT/INR.   Monitor for any bleeding.  Arty Baumgartner, [place] Pager: [phone] 09/18/2015,10:09 AM

## 2015-09-18 NOTE — Progress Notes (Signed)
[place] for Heparin Indication: atrial fibrillation  Allergies  Allergen Reactions  . Sulfa Antibiotics Hives    Patient Measurements: Height: 5' 8"  (172.7 cm) Weight: 296 lb (134.3 kg) IBW/kg (Calculated) : 68.4 Heparin Dosing Weight: 102 kg  Vital Signs: Temp: 98.6 F (37 C) (08/22 1511) Temp Source: Oral (08/22 1511) BP: 107/54 (08/22 1511) Pulse Rate: 90 (08/22 1511)  Labs:  Recent Labs  09/15/15 2148 09/16/15 0813 09/17/15 0220 09/18/15 0623 09/18/15 1956  HGB 12.3* 11.5*  --  10.6*  --   HCT 36.2* 33.2*  --  31.2*  --   PLT 136* 127*  --  141*  --   LABPROT  --  24.2* 21.9* 18.4*  --   INR  --  2.14 1.88 1.51  --   HEPARINUNFRC  --   --   --   --  0.20*  CREATININE  --  1.03  --   --   --     Estimated Creatinine Clearance: 94.6 mL/min (by C-G formula based on SCr of 1.03 mg/dL).   Assessment:   65 yr old male admitted 09/15/15 after lawnmower accident, has fractured left humerus. He is on Coumadin prior to admission for afib and hx CVAs.   INR was therapeutic (2.28) on admission, but Coumadin held due to significant shoulder hematoma and resumed 8/21. INR down to 1.51 today and heparin added (no bolus per MD)    Home Coumadin regimen: 7.5 mg daily.  He reports sometimes taking 10 mg a few days a week in the past, if INR fell < 2  Goal of Therapy:  INR 2-3 Heparin level 0.3-0.5 units/ml Monitor platelets by anticoagulation protocol: Yes   Plan:  -Increase heparin to 1450 units/hr -Heparin level in 6 hours and daily wth CBC daily  Hildred Laser, Pharm D 09/18/2015 9:25 PM

## 2015-09-18 NOTE — Discharge Instructions (Addendum)

## 2015-09-19 ENCOUNTER — Telehealth: Payer: Self-pay

## 2015-09-19 DIAGNOSIS — S40012A Contusion of left shoulder, initial encounter: Secondary | ICD-10-CM | POA: Diagnosis present

## 2015-09-19 DIAGNOSIS — D62 Acute posthemorrhagic anemia: Secondary | ICD-10-CM | POA: Diagnosis not present

## 2015-09-19 LAB — CBC
HCT: 31.8 % — ABNORMAL LOW (ref 39.0–52.0)
Hemoglobin: 10.6 g/dL — ABNORMAL LOW (ref 13.0–17.0)
MCH: 33.1 pg (ref 26.0–34.0)
MCHC: 33.3 g/dL (ref 30.0–36.0)
MCV: 99.4 fL (ref 78.0–100.0)
Platelets: 157 10*3/uL (ref 150–400)
RBC: 3.2 MIL/uL — ABNORMAL LOW (ref 4.22–5.81)
RDW: 13.6 % (ref 11.5–15.5)
WBC: 6.8 10*3/uL (ref 4.0–10.5)

## 2015-09-19 LAB — GLUCOSE, CAPILLARY
Glucose-Capillary: 195 mg/dL — ABNORMAL HIGH (ref 65–99)
Glucose-Capillary: 178 mg/dL — ABNORMAL HIGH (ref 65–99)

## 2015-09-19 LAB — PROTIME-INR
INR: 1.7
Prothrombin Time: 20.2 seconds — ABNORMAL HIGH (ref 11.4–15.2)

## 2015-09-19 LAB — HEPARIN LEVEL (UNFRACTIONATED): Heparin Unfractionated: 0.45 IU/mL (ref 0.30–0.70)

## 2015-09-19 MED ORDER — DILTIAZEM HCL 60 MG PO TABS: 60.0000 mg | ORAL_TABLET | Freq: Three times a day (TID) | ORAL | 0 refills | Status: DC

## 2015-09-19 MED ORDER — OXYCODONE-ACETAMINOPHEN 5-325 MG PO TABS: 1.0000 | ORAL_TABLET | ORAL | 0 refills | Status: DC | PRN

## 2015-09-19 MED ORDER — ENOXAPARIN SODIUM 150 MG/ML ~~LOC~~ SOLN: 140.0000 mg | Freq: Two times a day (BID) | SUBCUTANEOUS | 0 refills | Status: DC

## 2015-09-19 NOTE — Telephone Encounter (Signed)
[id number] 

## 2015-09-19 NOTE — Care Management Important Message (Signed)
Important Message  Patient Details  Name: Christian Berg MRN: [id number] Date of Birth: 1950/12/29   Medicare Important Message Given:  Yes    [name] Montine Circle 09/19/2015, 11:56 AM

## 2015-09-19 NOTE — Care Management Note (Signed)
Case Management Note  Patient Details  Name: Christian Berg MRN: [id number] Date of Birth: 03-Aug-1950  Subjective/Objective:   Pt admitted on 09/15/15 s/p lawnmower accident with Lt humerus fx and rib fx.  PTA, pt independent, lives with spouse.  PT/OT recommending no OP follow up, 3 in 1 for home.                    Action/Plan: Pt stable for dc home today with spouse.  HHRN initially ordered for PT/INR follow ups; pt politely declines HH, stating he is fine to go to his primary MD office to have his blood drawn.  Called [name], PA's office ([place]); appointment made for Friday, August 25 at 9:45 for PT/INR draw.  Referral to [facility] for DME needs.    Expected Discharge Date:  09/24/15               Expected Discharge Plan:  Home/Self Care  In-House Referral:     Discharge planning Services  CM Consult, Follow-up appt scheduled  Post Acute Care Choice:    Choice offered to:     DME Arranged:   3 in 1 [facility] DME Agency:    [place] Arranged:    Pt Refused HH Agency:     Status of Service:  Completed, signed off  If discussed at [place] of Stay Meetings, dates discussed:    Additional Comments:  Reinaldo Raddle, RN, BSN  Trauma/Neuro ICU Case Manager [phone]

## 2015-09-19 NOTE — Progress Notes (Signed)
    Subjective:  Denies CP or dyspnea   Objective:  Vitals:   09/18/15 1205 09/18/15 1511 09/18/15 2153 09/19/15 0621  BP: 121/69 (!) 107/54 135/82 (!) 142/86  Pulse: 92 90 (!) 128 98  Resp: 18 18 17 18   Temp: 98.5 F (36.9 C) 98.6 F (37 C) 99.2 F (37.3 C) 99.5 F (37.5 C)  TempSrc: Oral Oral Oral Oral  SpO2: 100% 98% 99% 100%  Weight:      Height:        Intake/Output from previous day:  Intake/Output Summary (Last 24 hours) at 09/19/15 2863 Last data filed at 09/19/15 0600  Gross per 24 hour  Intake           1313.2 ml  Output                0 ml  Net           1313.2 ml    Physical Exam: Physical exam: Well-developed obese in no acute distress.  Skin is warm and dry.  HEENT is with abrations Neck is supple.  Chest is clear to auscultation with normal expansion.  Cardiovascular exam is irregular Abdominal exam nontender or distended. No masses palpated. Extremities show no edema. Left shoulder hematoma neuro grossly intact    Lab Results: Basic Metabolic Panel: No results for input(s): NA, K, CL, CO2, GLUCOSE, BUN, CREATININE, CALCIUM, MG, PHOS in the last 72 hours. CBC:  Recent Labs  09/18/15 0623 09/19/15 0638  WBC 6.7 6.8  NEUTROABS 4.0  --   HGB 10.6* 10.6*  HCT 31.2* 31.8*  MCV 100.0 99.4  PLT 141* 157     Assessment/Plan:  1 Permanent atrial fibrillation-HR 90-120. He will be discharged today with plans for outpt Lovenox-Coumadin ( INR 1.7). 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  2 h/o pacemaker 3 trauma-per surgery-tractor accident   Plan: F/U INR Friday at his PCP in [place].  F/U with Dr Nehemiah Massed- his cardiologist- in two weeks.   [employer] PA 09/19/2015, 8:28 AM

## 2015-09-19 NOTE — Telephone Encounter (Signed)
-----   Message from [name], [place] sent at 09/19/2015 10:06 AM EDT ----- Regarding: office visit 8/25 Schedule f/u visit for [facility] after recent hospitalization for a fractured humerus. Document how he is doing in the message.

## 2015-09-19 NOTE — Telephone Encounter (Signed)
PT/INR: Scheduled for Friday. Spoke with patient on the phone who states that he fractured rib and shoulder. Patient states that he is doing okay, he is currently wearing a sling and is staying mobile. KW

## 2015-09-19 NOTE — Progress Notes (Signed)
Patient ID: Christian Berg, male   DOB: May 12, 1950, 65 y.o.   MRN: [id number]   LOS: 4 days   Subjective: Doing well, ready to go home.   Objective: Vital signs in last 24 hours: Temp:  [98.5 F (36.9 C)-99.5 F (37.5 C)] 99.5 F (37.5 C) (08/23 0621) Pulse Rate:  [90-128] 98 (08/23 0621) Resp:  [17-18] 18 (08/23 0621) BP: (107-142)/(54-86) 142/86 (08/23 0621) SpO2:  [98 %-100 %] 100 % (08/23 0621) Last BM Date: 09/18/15   Laboratory  CBC  Recent Labs  09/18/15 0623 09/19/15 0638  WBC 6.7 6.8  HGB 10.6* 10.6*  HCT 31.2* 31.8*  PLT 141* 157   PT/INR: Pending   Physical Exam General appearance: alert and no distress Resp: clear to auscultation bilaterally Cardio: regular rate and rhythm GI: normal findings: bowel sounds normal and soft, non-tender Extremities: NVI   Assessment/Plan: Lawnmower accident Left humerus fx w/shoulder hematoma -- per Dr. Lyla Glassing ABL anemia -- Mild, stable Afib on coumadin -- Restarted coumadin yesterday Multiple medical problems -- Home meds Dispo -- Will d/c home on Lovenox bridge    Lisette Abu, PA-C Pager: [phone] General Trauma PA Pager: [phone]  09/19/2015

## 2015-09-19 NOTE — Progress Notes (Signed)
Pt discharged home in stable condition this pm, wife provided transportation. Discharge teaching included lovenox administration at home. Pt and wife both voiced understanding the teaching

## 2015-09-19 NOTE — Discharge Summary (Signed)
Physician Discharge Summary  Patient ID: Christian Berg MRN: [id number] DOB/AGE: 29-Jan-1950 65 y.o.  Admit date: 09/15/2015 Discharge date: 09/19/2015  Discharge Diagnoses Patient Active Problem List   Diagnosis Date Noted  . Fracture of left humerus 09/19/2015  . Traumatic hematoma of left shoulder 09/19/2015  . Acute blood loss anemia 09/19/2015  . Contact with powered lawnmower as cause of accidental injury at home as place of occurrence 09/15/2015  . Ischemic embolic stroke ([place]) 54/62/7035  . Congestive heart failure ([place]) 12/15/2014  . Peripheral vascular disease ([place]) 12/15/2014  . Morbid (severe) obesity due to excess calories ([place]) 11/22/2014  . VFD (visual field defect) 11/22/2014  . Cerebrovascular accident (CVA) of uncertain pathology involving posterior circulation ([place]) 11/14/2014  . History of TIA (transient ischemic attack) 10/31/2014  . Kidney stones 07/24/2014  . Other cirrhosis of liver ([place]) 07/24/2014  . DSAP (disseminated superficial actinic porokeratosis) 07/17/2014  . Asthma 05/31/2014  . Chronic diastolic heart failure ([place]) 05/31/2014  . Diabetes mellitus ([place]) 05/31/2014  . Hypercholesteremia 05/31/2014  . B12 deficiency 05/31/2014  . Carotid artery narrowing 02/15/2014  . Chronic atrial fibrillation ([place]) 10/24/2013  . 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 Benign prostatic hyperplasia with urinary obstruction 09/06/2012  . Heart disease 05/29/2009  . Benign essential HTN 09/29/2008  . Narrowing of intervertebral disc space 01/11/2007    Consultants Dr. Rod Berg for orthopedic surgery  Dr. Ernest Berg for cardiology   Procedures None   HPI: Christian Berg was on a riding lawnmower that rolled over, landing on his left shoulder and chest. He came in as a non-trauma activation and was being evaluated by the ED.  Several hours after arrival, he was on the way to the CT scanner when he was noted to be hypotensive. A level I activation was called, but upon  the arrival of the trauma team, the patient was in the CT scanner and his blood pressure was normal. His CT scans showed no internal injuries. His only significant injuries were a left humerus fracture and a rib fracture. However, the patient was anticoagulated and he had a hematoma in his left shoulder that was expanding and he had been tachycardic. Orthopedic surgery and cardiology were consulted and he was admitted to the trauma service.   Hospital Course: Orthopedic surgery recommended non-operative treatment of his humerus fracture. Cardiology recommended cessation of coumadin and then beginning a heparin drip once his INR became nontherapeutic. He was mobilized with physical and occupational therapies and did well. He develop a mild acute blood loss anemia that did not require transfusion. He was discharged home in good condition with resumption of his coumadin and a Lovenox bridge.     Medication List    TAKE these medications   aspirin EC 81 MG tablet Take 81 mg by mouth at bedtime.   diltiazem 60 MG tablet Commonly known as:  CARDIZEM Take 1 tablet (60 mg total) by mouth every 8 (eight) hours.   enoxaparin 150 MG/ML injection Commonly known as:  LOVENOX Inject 0.93 mLs (140 mg total) into the skin every 12 (twelve) hours.   fluorouracil 5 % cream Commonly known as:  EFUDEX Apply 1 application topically 2 (two) times daily. As needed for DSAP   glipiZIDE 5 MG tablet Commonly known as:  GLUCOTROL Take 2.5 mg by mouth 2 (two) times daily before a meal.   lisinopril 20 MG tablet Commonly known as:  PRINIVIL,ZESTRIL Take 1 tablet (20 mg total) by mouth daily.   lovastatin 10 MG  tablet Commonly known as:  MEVACOR TAKE TWO TABLETS BY MOUTH ONCE DAILY   metFORMIN 850 MG tablet Commonly known as:  GLUCOPHAGE Take 1 tablet (850 mg total) by mouth 2 (two) times daily with a meal.   metoprolol 100 MG tablet Commonly known as:  LOPRESSOR TAKE ONE TABLET BY MOUTH TWICE DAILY    nystatin cream Commonly known as:  MYCOSTATIN Apply 1 application topically 2 (two) times daily. What changed:  when to take this  reasons to take this   oxyCODONE-acetaminophen 5-325 MG tablet Commonly known as:  ROXICET Take 1-2 tablets by mouth every 4 (four) hours as needed (Pain).   traMADol-acetaminophen 37.5-325 MG tablet Commonly known as:  ULTRACET One to two every 6 hours as needed for back pain What changed:  how much to take  how to take this  when to take this  additional instructions   warfarin 7.5 MG tablet Commonly known as:  COUMADIN TAKE ONE TABLET BY MOUTH ONCE DAILY       Follow-up Information    NORRIS,STEVEN R, MD. Schedule an appointment as soon as possible for a visit in 1 week(s).   Specialty:  Orthopedic Surgery Contact information: [address] [place] [address] [phone]        [facility] .   Why:  Call as needed Contact information: [address] [id number] [place], [place] .   Specialty:  Family Medicine Why:  Call today to have INR checked Friday 8/25 Contact information: [address] [place] [address] [phone]        Corey Skains, MD .   Specialty:  Internal Medicine Why:  Call office to arrange follow up in 1-2 weeks Contact information: [facility] [place] [address] [phone]            Signed: Lisette Abu, PA-C Pager: [phone] General Trauma PA Pager: [phone] 09/19/2015, 8:31 AM

## 2015-09-21 ENCOUNTER — Ambulatory Visit (INDEPENDENT_AMBULATORY_CARE_PROVIDER_SITE_OTHER): Payer: Medicare Other

## 2015-09-21 DIAGNOSIS — I482 Chronic atrial fibrillation, unspecified: Secondary | ICD-10-CM

## 2015-09-21 LAB — POCT INR
INR: 1.9
PT: 22.4

## 2015-09-21 NOTE — Patient Instructions (Signed)
Anticoagulation Dose Instructions as of 09/21/2015      Christian Berg Tue Wed Thu Fri Sat   New Dose 7.5 mg 7.5 mg 7.5 mg 7.5 mg 7.5 mg 7.5 mg 7.5 mg    Description   Make no changes today, take 7.5 mg daily and re check in 1 week.

## 2015-09-28 ENCOUNTER — Ambulatory Visit (INDEPENDENT_AMBULATORY_CARE_PROVIDER_SITE_OTHER): Payer: Medicare Other | Admitting: Emergency Medicine

## 2015-09-28 DIAGNOSIS — I482 Chronic atrial fibrillation, unspecified: Secondary | ICD-10-CM

## 2015-09-28 LAB — POCT INR
INR: 2.8
PT: 33.8

## 2015-09-28 NOTE — Patient Instructions (Signed)
Anticoagulation Dose Instructions as of 09/28/2015      Dorene Grebe Tue Wed Thu Fri Sat   New Dose 7.5 mg 7.5 mg 7.5 mg 7.5 mg 7.5 mg 7.5 mg 7.5 mg    Description   Make no changes today, take 7.5 mg daily and re check in 2 weeks.

## 2015-10-04 DIAGNOSIS — I6523 Occlusion and stenosis of bilateral carotid arteries: Secondary | ICD-10-CM | POA: Diagnosis not present

## 2015-10-04 DIAGNOSIS — I482 Chronic atrial fibrillation: Secondary | ICD-10-CM | POA: Diagnosis not present

## 2015-10-10 ENCOUNTER — Encounter: Payer: Self-pay | Admitting: Family Medicine

## 2015-10-10 ENCOUNTER — Ambulatory Visit (INDEPENDENT_AMBULATORY_CARE_PROVIDER_SITE_OTHER): Payer: Medicare Other | Admitting: Family Medicine

## 2015-10-10 VITALS — BP 126/82 | HR 68 | Temp 99.2°F | Resp 16 | Wt 293.0 lb

## 2015-10-10 DIAGNOSIS — S42302D Unspecified fracture of shaft of humerus, left arm, subsequent encounter for fracture with routine healing: Secondary | ICD-10-CM | POA: Diagnosis not present

## 2015-10-10 DIAGNOSIS — N3001 Acute cystitis with hematuria: Secondary | ICD-10-CM

## 2015-10-10 DIAGNOSIS — I482 Chronic atrial fibrillation, unspecified: Secondary | ICD-10-CM

## 2015-10-10 LAB — POCT INR
INR: 2.6
Prothrombin Time: 31.2

## 2015-10-10 LAB — POCT URINALYSIS DIPSTICK
Glucose, UA: 2000
Nitrite, UA: POSITIVE
Spec Grav, UA: 1.01
Urobilinogen, UA: 1
pH, UA: 6

## 2015-10-10 MED ORDER — DOXYCYCLINE HYCLATE 100 MG PO TABS
100.0000 mg | ORAL_TABLET | Freq: Two times a day (BID) | ORAL | 0 refills | Status: DC
Start: 2015-10-10 — End: 2016-01-03

## 2015-10-10 NOTE — Progress Notes (Signed)
Subjective:     Patient ID: Christian Berg, male   DOB: 11-04-1950, 65 y.o.   MRN: [id number]  HPI  Chief Complaint  Patient presents with  . Back Pain    Patient comes in office today with complaints of back pain, dysuria, chills and fever for the past 24hrs  States he has not been able yet to get in the [facility] orthopedics in f/u of his comminuted left humerus fracture (discharged 8/23). Reports less pain and swelling. Also requests Protime today as due later this week. Accompanied by his wife today.   Review of Systems     Objective:   Physical Exam  Constitutional: He appears well-developed and well-nourished. No distress.       Assessment:    1. Fracture of left humerus, with routine healing, subsequent encounter - Ambulatory referral to Orthopedic Surgery  2. Acute cystitis with hematuria - POCT urinalysis dipstick - Urine culture - doxycycline (VIBRA-TABS) 100 MG tablet; Take 1 tablet (100 mg total) by mouth 2 (two) times daily.  Dispense: 14 tablet; Refill: 0  3. Chronic atrial fibrillation (HCC) - POCT INR    Plan:    Further f/u pending culture report. Protime in 4 weeks. F/u diabetes when UTI resolved.

## 2015-10-10 NOTE — Patient Instructions (Addendum)
We will call about the orthopedic referral. Schedule a f/u visit for diabetes when your urinary infection is gone.

## 2015-10-11 DIAGNOSIS — S42292A Other displaced fracture of upper end of left humerus, initial encounter for closed fracture: Secondary | ICD-10-CM | POA: Diagnosis not present

## 2015-10-11 DIAGNOSIS — M25512 Pain in left shoulder: Secondary | ICD-10-CM | POA: Diagnosis not present

## 2015-10-12 ENCOUNTER — Ambulatory Visit: Payer: Self-pay

## 2015-10-12 ENCOUNTER — Ambulatory Visit
Admission: RE | Admit: 2015-10-12 | Discharge: 2015-10-12 | Disposition: A | Discharge status: Home / Self Care | Payer: Medicare Other | Source: Ambulatory Visit | Attending: Orthopedic Surgery | Admitting: Orthopedic Surgery

## 2015-10-12 ENCOUNTER — Other Ambulatory Visit: Payer: Self-pay | Admitting: Orthopedic Surgery

## 2015-10-12 DIAGNOSIS — S42292A Other displaced fracture of upper end of left humerus, initial encounter for closed fracture: Secondary | ICD-10-CM | POA: Diagnosis not present

## 2015-10-13 LAB — URINE CULTURE

## 2015-10-15 ENCOUNTER — Telehealth: Payer: Self-pay

## 2015-10-15 NOTE — Telephone Encounter (Signed)
-----   Message from [name], [place] sent at 10/13/2015 12:07 PM EDT ----- No specific organisms on culture-are antibiotics helping?

## 2015-10-15 NOTE — Telephone Encounter (Signed)
Advised pt of lab results. Pt verbally acknowledges understanding. Abx are improving sx per pt. Renaldo Fiddler, CMA

## 2015-10-16 ENCOUNTER — Other Ambulatory Visit: Payer: Self-pay | Admitting: Family Medicine

## 2015-10-16 DIAGNOSIS — M25512 Pain in left shoulder: Secondary | ICD-10-CM | POA: Diagnosis not present

## 2015-10-16 DIAGNOSIS — I10 Essential (primary) hypertension: Secondary | ICD-10-CM

## 2015-10-16 DIAGNOSIS — I482 Chronic atrial fibrillation, unspecified: Secondary | ICD-10-CM

## 2015-10-16 DIAGNOSIS — S42292G Other displaced fracture of upper end of left humerus, subsequent encounter for fracture with delayed healing: Secondary | ICD-10-CM | POA: Diagnosis not present

## 2015-10-16 MED ORDER — LISINOPRIL 20 MG PO TABS: 20.0000 mg | ORAL_TABLET | Freq: Every day | ORAL | 3 refills | Status: DC

## 2015-10-17 DIAGNOSIS — S42202G Unspecified fracture of upper end of left humerus, subsequent encounter for fracture with delayed healing: Secondary | ICD-10-CM | POA: Insufficient documentation

## 2015-10-17 DIAGNOSIS — I482 Chronic atrial fibrillation: Secondary | ICD-10-CM | POA: Diagnosis not present

## 2015-10-22 DIAGNOSIS — J4520 Mild intermittent asthma, uncomplicated: Secondary | ICD-10-CM | POA: Diagnosis not present

## 2015-10-22 DIAGNOSIS — I5042 Chronic combined systolic (congestive) and diastolic (congestive) heart failure: Secondary | ICD-10-CM | POA: Diagnosis not present

## 2015-10-22 DIAGNOSIS — Z7984 Long term (current) use of oral hypoglycemic drugs: Secondary | ICD-10-CM | POA: Diagnosis not present

## 2015-10-22 DIAGNOSIS — Z6841 Body Mass Index (BMI) 40.0 and over, adult: Secondary | ICD-10-CM | POA: Diagnosis not present

## 2015-10-22 DIAGNOSIS — I517 Cardiomegaly: Secondary | ICD-10-CM | POA: Diagnosis not present

## 2015-10-22 DIAGNOSIS — K760 Fatty (change of) liver, not elsewhere classified: Secondary | ICD-10-CM | POA: Diagnosis not present

## 2015-10-22 DIAGNOSIS — S42292G Other displaced fracture of upper end of left humerus, subsequent encounter for fracture with delayed healing: Secondary | ICD-10-CM | POA: Diagnosis not present

## 2015-10-22 DIAGNOSIS — Z01818 Encounter for other preprocedural examination: Secondary | ICD-10-CM | POA: Diagnosis not present

## 2015-10-22 DIAGNOSIS — E118 Type 2 diabetes mellitus with unspecified complications: Secondary | ICD-10-CM | POA: Diagnosis not present

## 2015-10-22 DIAGNOSIS — I6523 Occlusion and stenosis of bilateral carotid arteries: Secondary | ICD-10-CM | POA: Diagnosis not present

## 2015-10-22 DIAGNOSIS — I739 Peripheral vascular disease, unspecified: Secondary | ICD-10-CM | POA: Diagnosis not present

## 2015-10-22 DIAGNOSIS — I10 Essential (primary) hypertension: Secondary | ICD-10-CM | POA: Diagnosis not present

## 2015-10-22 DIAGNOSIS — E782 Mixed hyperlipidemia: Secondary | ICD-10-CM | POA: Diagnosis not present

## 2015-10-22 DIAGNOSIS — Z950 Presence of cardiac pacemaker: Secondary | ICD-10-CM | POA: Diagnosis not present

## 2015-10-22 DIAGNOSIS — E8809 Other disorders of plasma-protein metabolism, not elsewhere classified: Secondary | ICD-10-CM | POA: Insufficient documentation

## 2015-10-22 DIAGNOSIS — D649 Anemia, unspecified: Secondary | ICD-10-CM | POA: Diagnosis not present

## 2015-10-22 DIAGNOSIS — I272 Other secondary pulmonary hypertension: Secondary | ICD-10-CM | POA: Diagnosis not present

## 2015-10-22 DIAGNOSIS — I482 Chronic atrial fibrillation: Secondary | ICD-10-CM | POA: Diagnosis not present

## 2015-10-22 DIAGNOSIS — K7581 Nonalcoholic steatohepatitis (NASH): Secondary | ICD-10-CM | POA: Insufficient documentation

## 2015-10-22 DIAGNOSIS — Z79899 Other long term (current) drug therapy: Secondary | ICD-10-CM | POA: Diagnosis not present

## 2015-10-22 DIAGNOSIS — R42 Dizziness and giddiness: Secondary | ICD-10-CM | POA: Diagnosis not present

## 2015-10-22 DIAGNOSIS — Z8673 Personal history of transient ischemic attack (TIA), and cerebral infarction without residual deficits: Secondary | ICD-10-CM | POA: Diagnosis not present

## 2015-10-22 DIAGNOSIS — Z7901 Long term (current) use of anticoagulants: Secondary | ICD-10-CM | POA: Diagnosis not present

## 2015-10-29 DIAGNOSIS — S42291A Other displaced fracture of upper end of right humerus, initial encounter for closed fracture: Secondary | ICD-10-CM | POA: Diagnosis not present

## 2015-10-29 DIAGNOSIS — S42292G Other displaced fracture of upper end of left humerus, subsequent encounter for fracture with delayed healing: Secondary | ICD-10-CM | POA: Diagnosis not present

## 2015-10-29 DIAGNOSIS — I10 Essential (primary) hypertension: Secondary | ICD-10-CM | POA: Diagnosis not present

## 2015-10-29 DIAGNOSIS — Z882 Allergy status to sulfonamides status: Secondary | ICD-10-CM | POA: Diagnosis not present

## 2015-10-29 DIAGNOSIS — I5042 Chronic combined systolic (congestive) and diastolic (congestive) heart failure: Secondary | ICD-10-CM | POA: Diagnosis not present

## 2015-10-29 DIAGNOSIS — I110 Hypertensive heart disease with heart failure: Secondary | ICD-10-CM | POA: Diagnosis present

## 2015-10-29 DIAGNOSIS — Z79899 Other long term (current) drug therapy: Secondary | ICD-10-CM | POA: Diagnosis not present

## 2015-10-29 DIAGNOSIS — Z950 Presence of cardiac pacemaker: Secondary | ICD-10-CM | POA: Diagnosis not present

## 2015-10-29 DIAGNOSIS — Z7982 Long term (current) use of aspirin: Secondary | ICD-10-CM | POA: Diagnosis not present

## 2015-10-29 DIAGNOSIS — E782 Mixed hyperlipidemia: Secondary | ICD-10-CM | POA: Diagnosis not present

## 2015-10-29 DIAGNOSIS — I6529 Occlusion and stenosis of unspecified carotid artery: Secondary | ICD-10-CM | POA: Diagnosis present

## 2015-10-29 DIAGNOSIS — Z6841 Body Mass Index (BMI) 40.0 and over, adult: Secondary | ICD-10-CM | POA: Diagnosis not present

## 2015-10-29 DIAGNOSIS — I482 Chronic atrial fibrillation: Secondary | ICD-10-CM | POA: Diagnosis not present

## 2015-10-29 DIAGNOSIS — E119 Type 2 diabetes mellitus without complications: Secondary | ICD-10-CM | POA: Diagnosis not present

## 2015-10-29 DIAGNOSIS — I495 Sick sinus syndrome: Secondary | ICD-10-CM | POA: Diagnosis not present

## 2015-10-29 DIAGNOSIS — G8929 Other chronic pain: Secondary | ICD-10-CM | POA: Diagnosis present

## 2015-10-29 DIAGNOSIS — Z8673 Personal history of transient ischemic attack (TIA), and cerebral infarction without residual deficits: Secondary | ICD-10-CM | POA: Diagnosis not present

## 2015-10-29 DIAGNOSIS — I739 Peripheral vascular disease, unspecified: Secondary | ICD-10-CM | POA: Diagnosis present

## 2015-10-29 DIAGNOSIS — S42242A 4-part fracture of surgical neck of left humerus, initial encounter for closed fracture: Secondary | ICD-10-CM | POA: Diagnosis present

## 2015-10-29 DIAGNOSIS — Z7984 Long term (current) use of oral hypoglycemic drugs: Secondary | ICD-10-CM | POA: Diagnosis not present

## 2015-10-29 DIAGNOSIS — Z7901 Long term (current) use of anticoagulants: Secondary | ICD-10-CM | POA: Diagnosis not present

## 2015-10-29 DIAGNOSIS — I272 Pulmonary hypertension, unspecified: Secondary | ICD-10-CM | POA: Diagnosis not present

## 2015-10-29 DIAGNOSIS — D696 Thrombocytopenia, unspecified: Secondary | ICD-10-CM | POA: Diagnosis not present

## 2015-11-07 ENCOUNTER — Ambulatory Visit (INDEPENDENT_AMBULATORY_CARE_PROVIDER_SITE_OTHER): Payer: Medicare Other

## 2015-11-07 DIAGNOSIS — Z23 Encounter for immunization: Secondary | ICD-10-CM | POA: Diagnosis not present

## 2015-11-07 DIAGNOSIS — I482 Chronic atrial fibrillation, unspecified: Secondary | ICD-10-CM

## 2015-11-07 LAB — POCT INR
INR: 2.5
Prothrombin Time: 30.4

## 2015-11-07 NOTE — Patient Instructions (Signed)
Anticoagulation Dose Instructions as of 11/07/2015      Christian Berg Tue Wed Thu Fri Sat   New Dose 7.5 mg 7.5 mg 7.5 mg 7.5 mg 7.5 mg 7.5 mg 7.5 mg    Description   Make no changes today, take 7.5 mg daily and re check in 4 weeks.

## 2015-11-13 DIAGNOSIS — S42292G Other displaced fracture of upper end of left humerus, subsequent encounter for fracture with delayed healing: Secondary | ICD-10-CM | POA: Diagnosis not present

## 2015-11-13 DIAGNOSIS — M25512 Pain in left shoulder: Secondary | ICD-10-CM | POA: Diagnosis not present

## 2015-11-16 ENCOUNTER — Other Ambulatory Visit: Payer: Self-pay | Admitting: Family Medicine

## 2015-11-16 DIAGNOSIS — I482 Chronic atrial fibrillation: Secondary | ICD-10-CM | POA: Diagnosis not present

## 2015-11-16 DIAGNOSIS — I6523 Occlusion and stenosis of bilateral carotid arteries: Secondary | ICD-10-CM | POA: Diagnosis not present

## 2015-11-16 DIAGNOSIS — E782 Mixed hyperlipidemia: Secondary | ICD-10-CM | POA: Diagnosis not present

## 2015-11-16 DIAGNOSIS — Z6841 Body Mass Index (BMI) 40.0 and over, adult: Secondary | ICD-10-CM | POA: Diagnosis not present

## 2015-11-19 ENCOUNTER — Other Ambulatory Visit: Payer: Self-pay | Admitting: Family Medicine

## 2015-12-05 ENCOUNTER — Ambulatory Visit (INDEPENDENT_AMBULATORY_CARE_PROVIDER_SITE_OTHER): Payer: Medicare Other

## 2015-12-05 DIAGNOSIS — I482 Chronic atrial fibrillation, unspecified: Secondary | ICD-10-CM

## 2015-12-05 LAB — POCT INR
INR: 2.4
PT: 29.2

## 2015-12-05 NOTE — Patient Instructions (Signed)
Anticoagulation Dose Instructions as of 12/05/2015      Christian Berg Tue Wed Thu Fri Sat   New Dose 7.5 mg 7.5 mg 7.5 mg 7.5 mg 7.5 mg 7.5 mg 7.5 mg    Description   Make no changes today, take 7.5 mg daily and re check in 4 weeks.

## 2015-12-11 DIAGNOSIS — S42292D Other displaced fracture of upper end of left humerus, subsequent encounter for fracture with routine healing: Secondary | ICD-10-CM | POA: Diagnosis not present

## 2015-12-11 DIAGNOSIS — M25512 Pain in left shoulder: Secondary | ICD-10-CM | POA: Diagnosis not present

## 2015-12-11 DIAGNOSIS — S42291D Other displaced fracture of upper end of right humerus, subsequent encounter for fracture with routine healing: Secondary | ICD-10-CM | POA: Diagnosis not present

## 2015-12-18 DIAGNOSIS — S42202G Unspecified fracture of upper end of left humerus, subsequent encounter for fracture with delayed healing: Secondary | ICD-10-CM | POA: Diagnosis not present

## 2015-12-18 DIAGNOSIS — M79622 Pain in left upper arm: Secondary | ICD-10-CM | POA: Diagnosis not present

## 2015-12-21 DIAGNOSIS — M79622 Pain in left upper arm: Secondary | ICD-10-CM | POA: Diagnosis not present

## 2015-12-21 DIAGNOSIS — S42202G Unspecified fracture of upper end of left humerus, subsequent encounter for fracture with delayed healing: Secondary | ICD-10-CM | POA: Diagnosis not present

## 2015-12-25 DIAGNOSIS — M79622 Pain in left upper arm: Secondary | ICD-10-CM | POA: Diagnosis not present

## 2015-12-25 DIAGNOSIS — S42202G Unspecified fracture of upper end of left humerus, subsequent encounter for fracture with delayed healing: Secondary | ICD-10-CM | POA: Diagnosis not present

## 2015-12-26 ENCOUNTER — Other Ambulatory Visit: Payer: Self-pay | Admitting: Family Medicine

## 2015-12-28 DIAGNOSIS — M79622 Pain in left upper arm: Secondary | ICD-10-CM | POA: Diagnosis not present

## 2015-12-28 DIAGNOSIS — S42202G Unspecified fracture of upper end of left humerus, subsequent encounter for fracture with delayed healing: Secondary | ICD-10-CM | POA: Diagnosis not present

## 2016-01-01 DIAGNOSIS — M79622 Pain in left upper arm: Secondary | ICD-10-CM | POA: Diagnosis not present

## 2016-01-01 DIAGNOSIS — S42202G Unspecified fracture of upper end of left humerus, subsequent encounter for fracture with delayed healing: Secondary | ICD-10-CM | POA: Diagnosis not present

## 2016-01-02 ENCOUNTER — Ambulatory Visit (INDEPENDENT_AMBULATORY_CARE_PROVIDER_SITE_OTHER): Payer: Medicare Other

## 2016-01-02 DIAGNOSIS — I482 Chronic atrial fibrillation, unspecified: Secondary | ICD-10-CM

## 2016-01-02 NOTE — Patient Instructions (Signed)
Anticoagulation Dose Instructions as of 01/02/2016      Dorene Grebe Tue Wed Thu Fri Sat   New Dose 7.5 mg 7.5 mg 7.5 mg 7.5 mg 7.5 mg 7.5 mg 7.5 mg    Description   Make no changes today, take 7.5 mg daily and re check in 4 weeks.

## 2016-01-03 ENCOUNTER — Ambulatory Visit (INDEPENDENT_AMBULATORY_CARE_PROVIDER_SITE_OTHER): Payer: Medicare Other | Admitting: Family Medicine

## 2016-01-03 ENCOUNTER — Encounter: Payer: Self-pay | Admitting: Family Medicine

## 2016-01-03 VITALS — BP 128/84 | HR 74 | Temp 98.5°F | Resp 16 | Wt 297.2 lb

## 2016-01-03 DIAGNOSIS — I10 Essential (primary) hypertension: Secondary | ICD-10-CM

## 2016-01-03 DIAGNOSIS — K760 Fatty (change of) liver, not elsewhere classified: Secondary | ICD-10-CM

## 2016-01-03 DIAGNOSIS — K7469 Other cirrhosis of liver: Secondary | ICD-10-CM | POA: Diagnosis not present

## 2016-01-03 DIAGNOSIS — E119 Type 2 diabetes mellitus without complications: Secondary | ICD-10-CM | POA: Diagnosis not present

## 2016-01-03 DIAGNOSIS — R197 Diarrhea, unspecified: Secondary | ICD-10-CM | POA: Diagnosis not present

## 2016-01-03 DIAGNOSIS — S42202G Unspecified fracture of upper end of left humerus, subsequent encounter for fracture with delayed healing: Secondary | ICD-10-CM | POA: Diagnosis not present

## 2016-01-03 LAB — POCT INR: INR: 8.6

## 2016-01-03 LAB — POCT GLYCOSYLATED HEMOGLOBIN (HGB A1C): Hemoglobin A1C: 8.6

## 2016-01-03 MED ORDER — OXYCODONE-ACETAMINOPHEN 5-325 MG PO TABS: 1.0000 | ORAL_TABLET | ORAL | 0 refills | Status: DC | PRN

## 2016-01-03 NOTE — Progress Notes (Signed)
Subjective:     Patient ID: Christian Berg, male   DOB: [birth year], 65 y.o.   MRN: [id number]  HPI  Chief Complaint  Patient presents with  . Follow-up    disccus lab order for liver function   . Diabetes    Follow up from 05/21/15, patient was advised to continua Januvia and decrease Metformin if no improvement. patient was advised to resume exercise and minimize sweet intake.   Has been undergoing therapy after surgery for a comminuted left humerus fracture on 10/29/15. Reports he no longer has oxycodone and tramadol does not control the arm pain which he is on for his chronic back pain. It s limiting his ability to participate in therapy. States he has had chronic loose stools with urgency over the last month. States he is compliant with glipizide and metformin but it is not clear that he started Januvia. Also has hx of fatty liver and cirrhosis. Does not wish to return to Dr. Allen Norris due to CVA that occurred in association with a liver biopsy. Accompanied by his wife.   Review of Systems  Respiratory: Negative for shortness of breath.   Cardiovascular: Negative for chest pain.       Objective:   Physical Exam  Constitutional: He appears well-developed and well-nourished. No distress.  Cardiovascular:  Irregularly, irregular rhythm per his baseline A. Fib.  Pulmonary/Chest: Breath sounds normal.  Musculoskeletal: He exhibits no edema (of lower extremities).       Assessment:    1. Type 2 diabetes mellitus without complication, without long-term current use of insulin (HCC) - POCT glycosylated hemoglobin (Hb A1C): improved control but will hold off on further medication adjustment pending lab work.  2. Benign essential HTN - Comprehensive metabolic panel  3. Diarrhea, unspecified type - Stool C-Diff Toxin Assay  4. Other cirrhosis of liver (HCC) - Comprehensive metabolic panel  5. Fatty liver  6. Closed fracture of proximal end of left humerus with delayed healing,  unspecified fracture morphology, subsequent encounter - oxyCODONE-acetaminophen (ROXICET) 5-325 MG tablet; Take 1-2 tablets by mouth every 4 (four) hours as needed (Pain).  Dispense: 60 tablet; Refill: 0    Plan:    Further f/u pending lab work and in 3 months. Will need referral to different G.I. specialist

## 2016-01-03 NOTE — Patient Instructions (Signed)
We will call you with the lab results as they come available. We will see about finding another G.I. Specialist once labs back.

## 2016-01-04 ENCOUNTER — Encounter: Payer: Self-pay | Admitting: Family Medicine

## 2016-01-04 ENCOUNTER — Other Ambulatory Visit: Payer: Self-pay | Admitting: Family Medicine

## 2016-01-04 DIAGNOSIS — S42202G Unspecified fracture of upper end of left humerus, subsequent encounter for fracture with delayed healing: Secondary | ICD-10-CM | POA: Diagnosis not present

## 2016-01-04 DIAGNOSIS — R197 Diarrhea, unspecified: Secondary | ICD-10-CM | POA: Diagnosis not present

## 2016-01-04 DIAGNOSIS — E119 Type 2 diabetes mellitus without complications: Secondary | ICD-10-CM

## 2016-01-04 DIAGNOSIS — M79622 Pain in left upper arm: Secondary | ICD-10-CM | POA: Diagnosis not present

## 2016-01-04 LAB — COMPREHENSIVE METABOLIC PANEL
ALT: 20 IU/L (ref 0–44)
AST: 36 IU/L (ref 0–40)
Albumin/Globulin Ratio: 1 — ABNORMAL LOW (ref 1.2–2.2)
Albumin: 3.5 g/dL — ABNORMAL LOW (ref 3.6–4.8)
Alkaline Phosphatase: 130 IU/L — ABNORMAL HIGH (ref 39–117)
BUN/Creatinine Ratio: 8 — ABNORMAL LOW (ref 10–24)
BUN: 7 mg/dL — ABNORMAL LOW (ref 8–27)
Bilirubin Total: 0.8 mg/dL (ref 0.0–1.2)
CO2: 24 mmol/L (ref 18–29)
Calcium: 9.1 mg/dL (ref 8.6–10.2)
Chloride: 98 mmol/L (ref 96–106)
Creatinine, Ser: 0.89 mg/dL (ref 0.76–1.27)
GFR calc Af Amer: 102 mL/min/{1.73_m2} (ref >59)
GFR calc non Af Amer: 88 mL/min/{1.73_m2} (ref >59)
Globulin, Total: 3.4 g/dL (ref 1.5–4.5)
Glucose: 330 mg/dL — ABNORMAL HIGH (ref 65–99)
Potassium: 4.4 mmol/L (ref 3.5–5.2)
Sodium: 136 mmol/L (ref 134–144)
Total Protein: 6.9 g/dL (ref 6.0–8.5)

## 2016-01-04 MED ORDER — GLIPIZIDE 5 MG PO TABS: 5.0000 mg | ORAL_TABLET | Freq: Two times a day (BID) | ORAL | 2 refills | Status: DC

## 2016-01-06 LAB — CLOSTRIDIUM DIFFICILE EIA: C difficile Toxins A+B, EIA: NEGATIVE

## 2016-01-07 ENCOUNTER — Telehealth: Payer: Self-pay

## 2016-01-07 NOTE — Telephone Encounter (Signed)
[id number] 

## 2016-01-07 NOTE — Telephone Encounter (Signed)
-----   Message from [name], [place] sent at 01/07/2016  7:33 AM EST ----- Your stool test is ok. I suspect the pain medication may start slowing down your diarrhea. If not try imodium occasionally. If nothing is helping call me for that  G.I. Referral.

## 2016-01-08 DIAGNOSIS — M79622 Pain in left upper arm: Secondary | ICD-10-CM | POA: Diagnosis not present

## 2016-01-08 DIAGNOSIS — S42202G Unspecified fracture of upper end of left humerus, subsequent encounter for fracture with delayed healing: Secondary | ICD-10-CM | POA: Diagnosis not present

## 2016-01-10 NOTE — Telephone Encounter (Signed)
[id number] 

## 2016-01-11 DIAGNOSIS — S42202G Unspecified fracture of upper end of left humerus, subsequent encounter for fracture with delayed healing: Secondary | ICD-10-CM | POA: Diagnosis not present

## 2016-01-11 DIAGNOSIS — M79622 Pain in left upper arm: Secondary | ICD-10-CM | POA: Diagnosis not present

## 2016-01-14 NOTE — Telephone Encounter (Signed)
LMTCB

## 2016-01-15 ENCOUNTER — Telehealth: Payer: Self-pay | Admitting: Family Medicine

## 2016-01-15 DIAGNOSIS — M79622 Pain in left upper arm: Secondary | ICD-10-CM | POA: Diagnosis not present

## 2016-01-15 DIAGNOSIS — S42202G Unspecified fracture of upper end of left humerus, subsequent encounter for fracture with delayed healing: Secondary | ICD-10-CM | POA: Diagnosis not present

## 2016-01-15 NOTE — Telephone Encounter (Signed)
Patient advised as below. Patient reports he is much better no more diarrhea.

## 2016-01-18 DIAGNOSIS — M79622 Pain in left upper arm: Secondary | ICD-10-CM | POA: Diagnosis not present

## 2016-01-18 DIAGNOSIS — S42202G Unspecified fracture of upper end of left humerus, subsequent encounter for fracture with delayed healing: Secondary | ICD-10-CM | POA: Diagnosis not present

## 2016-01-29 ENCOUNTER — Other Ambulatory Visit: Payer: Self-pay | Admitting: Family Medicine

## 2016-01-29 MED ORDER — METFORMIN HCL 850 MG PO TABS: 850.0000 mg | ORAL_TABLET | Freq: Two times a day (BID) | ORAL | 1 refills | Status: DC

## 2016-01-30 ENCOUNTER — Ambulatory Visit: Payer: Medicare Other | Admitting: Family Medicine

## 2016-01-30 ENCOUNTER — Other Ambulatory Visit: Payer: Self-pay | Admitting: Family Medicine

## 2016-01-30 ENCOUNTER — Ambulatory Visit (INDEPENDENT_AMBULATORY_CARE_PROVIDER_SITE_OTHER): Payer: Medicare Other

## 2016-01-30 DIAGNOSIS — G8929 Other chronic pain: Secondary | ICD-10-CM

## 2016-01-30 DIAGNOSIS — M545 Low back pain: Principal | ICD-10-CM

## 2016-01-30 DIAGNOSIS — I482 Chronic atrial fibrillation, unspecified: Secondary | ICD-10-CM

## 2016-01-30 LAB — POCT INR
INR: 2.1
PT: 25.3

## 2016-01-30 MED ORDER — TRAMADOL-ACETAMINOPHEN 37.5-325 MG PO TABS: ORAL_TABLET | ORAL | 5 refills | Status: DC

## 2016-01-30 NOTE — Patient Instructions (Signed)
Anticoagulation Dose Instructions as of 01/30/2016      Dorene Grebe Tue Wed Thu Fri Sat   New Dose 7.5 mg 7.5 mg 7.5 mg 7.5 mg 7.5 mg 7.5 mg 7.5 mg    Description   Make no changes today, take 7.5 mg daily and re check in 4 weeks.

## 2016-02-01 DIAGNOSIS — S42292G Other displaced fracture of upper end of left humerus, subsequent encounter for fracture with delayed healing: Secondary | ICD-10-CM | POA: Diagnosis not present

## 2016-02-01 DIAGNOSIS — M25512 Pain in left shoulder: Secondary | ICD-10-CM | POA: Diagnosis not present

## 2016-02-09 ENCOUNTER — Other Ambulatory Visit: Payer: Self-pay | Admitting: Family Medicine

## 2016-02-11 ENCOUNTER — Other Ambulatory Visit: Payer: Self-pay | Admitting: Family Medicine

## 2016-02-11 DIAGNOSIS — E1169 Type 2 diabetes mellitus with other specified complication: Secondary | ICD-10-CM

## 2016-02-11 DIAGNOSIS — E785 Hyperlipidemia, unspecified: Principal | ICD-10-CM

## 2016-02-11 MED ORDER — LOVASTATIN 10 MG PO TABS: 20.0000 mg | ORAL_TABLET | Freq: Every day | ORAL | 1 refills | Status: DC

## 2016-02-27 ENCOUNTER — Ambulatory Visit (INDEPENDENT_AMBULATORY_CARE_PROVIDER_SITE_OTHER): Payer: Medicare Other

## 2016-02-27 ENCOUNTER — Other Ambulatory Visit: Payer: Self-pay | Admitting: Family Medicine

## 2016-02-27 DIAGNOSIS — I482 Chronic atrial fibrillation, unspecified: Secondary | ICD-10-CM

## 2016-02-27 LAB — POCT INR
INR: 2
PT: 25.4

## 2016-02-27 NOTE — Patient Instructions (Signed)
Anticoagulation Dose Instructions as of 02/27/2016      Christian Berg Tue Wed Thu Fri Sat   New Dose 7.5 mg 7.5 mg 7.5 mg 7.5 mg 7.5 mg 7.5 mg 7.5 mg    Description   Make no changes today, take 7.5 mg daily and re check in 4 weeks.

## 2016-03-07 DIAGNOSIS — H401122 Primary open-angle glaucoma, left eye, moderate stage: Secondary | ICD-10-CM | POA: Diagnosis not present

## 2016-03-13 ENCOUNTER — Encounter: Payer: Self-pay | Admitting: Family Medicine

## 2016-03-15 ENCOUNTER — Ambulatory Visit (INDEPENDENT_AMBULATORY_CARE_PROVIDER_SITE_OTHER): Payer: Medicare Other | Admitting: Physician Assistant

## 2016-03-15 DIAGNOSIS — H6121 Impacted cerumen, right ear: Secondary | ICD-10-CM | POA: Diagnosis not present

## 2016-03-15 DIAGNOSIS — A46 Erysipelas: Secondary | ICD-10-CM | POA: Diagnosis not present

## 2016-03-15 MED ORDER — AMOXICILLIN 500 MG PO CAPS: 500.0000 mg | ORAL_CAPSULE | Freq: Three times a day (TID) | ORAL | 0 refills | Status: DC

## 2016-03-15 NOTE — Progress Notes (Signed)
g      Patient: Christian Berg Male    DOB: 11-29-1950   66 y.o.   MRN: [id number] Visit Date: 03/15/2016  Today's Provider: Mar Daring, PA-C   Chief Complaint  Patient presents with  . Ear Pain  . Headache   Subjective:    Patient stated that he woke this morning with a headache that is centered behind right ear. Patient also has redness around right ear that is hot and painful. Patient has no other symptoms, no upper respiratory, no fever. Patient stated that he had this same headache 4 days ago that only lasted for 2 hours.   Headache   This is a new problem. The current episode started today. The problem has been gradually worsening. The pain is located in the right unilateral region. The pain radiates to the face. The pain quality is similar to prior headaches. The quality of the pain is described as dull. The pain is at a severity of 4/10. The pain is moderate. Associated symptoms include scalp tenderness. Pertinent negatives include no abdominal pain, abnormal behavior, anorexia, back pain, blurred vision, coughing, dizziness, drainage, ear pain, eye pain, eye redness, facial sweating, fever, hearing loss, insomnia, loss of balance, muscle aches, nausea, neck pain, numbness, phonophobia, rhinorrhea, seizures, sinus pressure, sore throat, swollen glands, tingling, tinnitus, visual change, vomiting, weakness or weight loss. Nothing aggravates the symptoms. Treatments tried: tramadol  The treatment provided no relief. His past medical history is significant for cluster headaches. There is no history of migraine headaches.   The main complaint today is actually a new onset rash in the area where he is having tenderness and mild headache around that area. It is warm to touch. No exposure. Woke up this morning at 0630 and noticed it. The rash has not spread since. Does note mild swelling with the rash. It is located on the right side of the face and around the right ear. No  discharge. He reports he did wake up around 0230 this morning and did not have the rash. He also denies any insect bite/sting to his knowledge.    Allergies  Allergen Reactions  . Sulfa Antibiotics Hives     Current Outpatient Prescriptions:  .  aspirin EC 81 MG tablet, Take 81 mg by mouth at bedtime. , Disp: , Rfl:  .  diltiazem (CARDIZEM) 60 MG tablet, Take 1 tablet (60 mg total) by mouth every 8 (eight) hours., Disp: 90 tablet, Rfl: 0 .  enoxaparin (LOVENOX) 150 MG/ML injection, Inject 0.93 mLs (140 mg total) into the skin every 12 (twelve) hours., Disp: 10 Syringe, Rfl: 0 .  fluorouracil (EFUDEX) 5 % cream, Apply 1 application topically 2 (two) times daily. As needed for DSAP, Disp: 80 g, Rfl: 5 .  glipiZIDE (GLUCOTROL) 5 MG tablet, Take 1 tablet (5 mg total) by mouth 2 (two) times daily before a meal., Disp: 60 tablet, Rfl: 2 .  lisinopril (PRINIVIL,ZESTRIL) 20 MG tablet, Take 1 tablet (20 mg total) by mouth daily., Disp: 90 tablet, Rfl: 3 .  lovastatin (MEVACOR) 10 MG tablet, Take 2 tablets (20 mg total) by mouth daily., Disp: 180 tablet, Rfl: 1 .  metFORMIN (GLUCOPHAGE) 850 MG tablet, Take 1 tablet (850 mg total) by mouth 2 (two) times daily with a meal., Disp: 180 tablet, Rfl: 1 .  metoprolol (LOPRESSOR) 100 MG tablet, TAKE ONE TABLET BY MOUTH TWICE DAILY, Disp: 180 tablet, Rfl: 3 .  nystatin cream (MYCOSTATIN), Apply 1 application topically 2 (two)  times daily. (Patient taking differently: Apply 1 application topically 2 (two) times daily as needed. ), Disp: 30 g, Rfl: 0 .  oxyCODONE-acetaminophen (ROXICET) 5-325 MG tablet, Take 1-2 tablets by mouth every 4 (four) hours as needed (Pain)., Disp: 60 tablet, Rfl: 0 .  traMADol-acetaminophen (ULTRACET) 37.5-325 MG tablet, Two every  8 hours as needed for back pain. Maximum 8 tablets daily, Disp: 180 tablet, Rfl: 5 .  warfarin (COUMADIN) 7.5 MG tablet, TAKE ONE TABLET BY MOUTH ONCE DAILY, Disp: 30 tablet, Rfl: 5  Review of Systems    Constitutional: Negative for fever and weight loss.  HENT: Negative for congestion, ear pain, facial swelling, hearing loss, rhinorrhea, sinus pressure, sore throat, tinnitus and trouble swallowing.   Eyes: Negative for blurred vision, pain and redness.  Respiratory: Negative for cough.   Cardiovascular: Negative for chest pain, palpitations and leg swelling.  Gastrointestinal: Negative for abdominal pain, anorexia, nausea and vomiting.  Musculoskeletal: Negative for back pain, myalgias, neck pain and neck stiffness.  Skin: Positive for rash.  Neurological: Positive for headaches. Negative for dizziness, tingling, seizures, weakness, numbness and loss of balance.  Psychiatric/Behavioral: The patient does not have insomnia.     Social History  Substance Use Topics  . Smoking status: Never Smoker  . Smokeless tobacco: Never Used  . Alcohol use No   Objective:   There were no vitals taken for this visit.  Physical Exam  Constitutional: He appears well-developed and well-nourished. No distress.  HENT:  Head: Normocephalic and atraumatic.  Right Ear: Hearing, tympanic membrane, external ear and ear canal normal. Tympanic membrane is not erythematous and not bulging. No middle ear effusion.  Left Ear: Hearing, tympanic membrane, external ear and ear canal normal. Tympanic membrane is not erythematous and not bulging.  No middle ear effusion.  Nose: Mucosal edema and rhinorrhea present. Right sinus exhibits no maxillary sinus tenderness and no frontal sinus tenderness. Left sinus exhibits no maxillary sinus tenderness and no frontal sinus tenderness.  Mouth/Throat: Uvula is midline, oropharynx is clear and moist and mucous membranes are normal. Abnormal dentition. No oropharyngeal exudate, posterior oropharyngeal edema or posterior oropharyngeal erythema.  Cerumen impaction noted in the right ear. Ear lavage performed and successful. Right TM visualized and WNL.  Eyes: Conjunctivae and EOM  are normal. Pupils are equal, round, and reactive to light. Right eye exhibits no discharge. Left eye exhibits no discharge.  Neck: Normal range of motion. Neck supple. No tracheal deviation present. No Brudzinski's sign and no Kernig's sign noted. No thyromegaly present.  Cardiovascular: Normal rate, regular rhythm and normal heart sounds.  Exam reveals no gallop and no friction rub.   No murmur heard. Pulmonary/Chest: Effort normal and breath sounds normal. No stridor. No respiratory distress. He has no wheezes. He has no rales.  Lymphadenopathy:    He has no cervical adenopathy.  Skin: Skin is warm and dry. Rash noted. He is not diaphoretic.     Vitals reviewed.      Assessment & Plan:     1. Erysipelas Will treat with amoxil as below. Advised that he may use IBU prn for pain and fevers. He is to call if symptoms worsen. DDx: possible mastoiditis or mumps (both low differential), but if he doesn't improve would want to re-evaluate.  - amoxicillin (AMOXIL) 500 MG capsule; Take 1 capsule (500 mg total) by mouth 3 (three) times daily.  Dispense: 21 capsule; Refill: 0  2. Impacted cerumen of right ear Successful. Right TM visualized and normal. -  Ear Lavage       Mar Daring, PA-C  [facility]

## 2016-03-15 NOTE — Patient Instructions (Signed)
Erysipelas Introduction Erysipelas is an infection that affects the skin and the tissues that are near the surface of the skin. It causes the skin to become red, swollen, and painful. The infection is most common on the legs but may also affect other areas, such as the face. With treatment, the infection usually goes away in a few days. If not treated, the infection can spread or lead to other problems, such as abscesses. What are the causes? Erysipelas is caused by bacteria. Most often, it is caused by bacteria called streptococci. The bacteria often enter through a break in the skin, such as a cut, surgical incision, burn, insect bite, open sore, or crack in the skin. Sometimes the source where the bacteria entered is not known. What increases the risk? Some people are at an increased risk for developing erysipelas, including:  Young children.  Elderly people.  People with a weakened body defense system (immune system), such as people with HIV or AIDS.  People who have diabetes.  People who drink too much alcohol.  People who have had recent surgery.  People with yeast infections of the skin.  People who have swollen legs. What are the signs or symptoms? The infection causes a reddened area on the skin. This reddened area may:  Be painful and swollen.  Have a distinct border around it.  Feel itchy and hot.  Develop blisters. Other symptoms may include:  Fever.  Chills.  Nausea and vomiting.  Swollen glands (lymph nodes).  Headache.  Fatigue.  Loss of appetite. How is this diagnosed? Your health care provider will take your medical history and do a physical exam. He or she will usually be able to diagnose erysipelas by closely examining your skin. How is this treated? Erysipelas can usually be treated effectively with antibiotic medicines. The infection usually gets better within a few days of treatment. Follow these instructions at home:  Take medicines only as  directed by your health care provider.  Take your antibiotic medicine as directed by your health care provider. Finish the antibiotic even if you start to feel better.  If the skin infection is on your leg or arm, elevate the leg or arm to help reduce swelling.  Do not put creams or lotions on the affected area of your skin unless your health care provider instructs you to do that.  Do not share bedding, towels, or washcloths (linens) with other people. Using only your own linens will help to prevent the infection from spreading to others.  Keep all follow-up visits as directed by your health care provider. This is important. Contact a health care provider if:  You have pain or discomfort that is not controlled by medicines.  Your red area of skin gets larger or turns dark in color.  Your skin infection returns in the same area or appears in another area. Get help right away if:  Your fever is getting worse.  Your feelings of illness are getting worse.  You notice red streaks coming from the infected area. This information is not intended to replace advice given to you by your health care provider. Make sure you discuss any questions you have with your health care provider. Document Released: 10/08/2000 Document Revised: 06/21/2015 Document Reviewed: 08/29/2013  2017 [employer]vier

## 2016-03-26 ENCOUNTER — Ambulatory Visit (INDEPENDENT_AMBULATORY_CARE_PROVIDER_SITE_OTHER): Payer: Medicare Other

## 2016-03-26 DIAGNOSIS — I482 Chronic atrial fibrillation, unspecified: Secondary | ICD-10-CM

## 2016-03-26 LAB — POCT INR
INR: 2.3
PT: 27.5

## 2016-03-26 NOTE — Patient Instructions (Signed)
Anticoagulation Dose Instructions as of 03/26/2016      Christian Berg Tue Wed Thu Fri Sat   New Dose 7.5 mg 7.5 mg 7.5 mg 7.5 mg 7.5 mg 7.5 mg 7.5 mg    Description   Make no changes today, take 7.5 mg daily and re check in 4 weeks.

## 2016-04-23 ENCOUNTER — Ambulatory Visit (INDEPENDENT_AMBULATORY_CARE_PROVIDER_SITE_OTHER): Payer: Medicare Other

## 2016-04-23 DIAGNOSIS — I482 Chronic atrial fibrillation, unspecified: Secondary | ICD-10-CM

## 2016-04-23 LAB — POCT INR
INR: 2.2
PT: 26.1

## 2016-04-23 NOTE — Progress Notes (Signed)
Anticoagulation Dose Instructions as of 04/23/2016      Christian Berg Tue Wed Thu Fri Sat   New Dose 7.5 mg 7.5 mg 7.5 mg 7.5 mg 7.5 mg 7.5 mg 7.5 mg    Description   Take 7.5 mg daily and re check in 4 weeks.

## 2016-05-01 DIAGNOSIS — H401122 Primary open-angle glaucoma, left eye, moderate stage: Secondary | ICD-10-CM | POA: Diagnosis not present

## 2016-05-05 DIAGNOSIS — I5042 Chronic combined systolic (congestive) and diastolic (congestive) heart failure: Secondary | ICD-10-CM | POA: Diagnosis not present

## 2016-05-05 DIAGNOSIS — Z6841 Body Mass Index (BMI) 40.0 and over, adult: Secondary | ICD-10-CM | POA: Diagnosis not present

## 2016-05-05 DIAGNOSIS — I10 Essential (primary) hypertension: Secondary | ICD-10-CM | POA: Diagnosis not present

## 2016-05-05 DIAGNOSIS — I482 Chronic atrial fibrillation: Secondary | ICD-10-CM | POA: Diagnosis not present

## 2016-05-16 ENCOUNTER — Ambulatory Visit (INDEPENDENT_AMBULATORY_CARE_PROVIDER_SITE_OTHER): Payer: Medicare Other

## 2016-05-16 DIAGNOSIS — I482 Chronic atrial fibrillation, unspecified: Secondary | ICD-10-CM

## 2016-05-16 LAB — POCT INR
INR: 2.2
PT: 26.6

## 2016-05-16 NOTE — Patient Instructions (Signed)
Anticoagulation Dose Instructions as of 05/16/2016      Dorene Grebe Tue Wed Thu Fri Sat   New Dose 7.5 mg 7.5 mg 7.5 mg 7.5 mg 7.5 mg 7.5 mg 7.5 mg    Description   Take 7.5 mg daily and re check in 4 weeks.

## 2016-05-29 ENCOUNTER — Ambulatory Visit (INDEPENDENT_AMBULATORY_CARE_PROVIDER_SITE_OTHER): Payer: Medicare Other | Admitting: Family Medicine

## 2016-05-29 ENCOUNTER — Encounter: Payer: Self-pay | Admitting: Family Medicine

## 2016-05-29 VITALS — BP 120/90 | HR 95 | Temp 99.1°F | Resp 17 | Wt 292.4 lb

## 2016-05-29 DIAGNOSIS — M79604 Pain in right leg: Secondary | ICD-10-CM

## 2016-05-29 DIAGNOSIS — E119 Type 2 diabetes mellitus without complications: Secondary | ICD-10-CM

## 2016-05-29 DIAGNOSIS — B354 Tinea corporis: Secondary | ICD-10-CM | POA: Diagnosis not present

## 2016-05-29 DIAGNOSIS — R350 Frequency of micturition: Secondary | ICD-10-CM

## 2016-05-29 DIAGNOSIS — R3915 Urgency of urination: Secondary | ICD-10-CM | POA: Diagnosis not present

## 2016-05-29 DIAGNOSIS — L740 Miliaria rubra: Secondary | ICD-10-CM | POA: Diagnosis not present

## 2016-05-29 DIAGNOSIS — L03317 Cellulitis of buttock: Secondary | ICD-10-CM

## 2016-05-29 DIAGNOSIS — M79605 Pain in left leg: Secondary | ICD-10-CM

## 2016-05-29 LAB — POCT URINALYSIS DIPSTICK
Bilirubin, UA: NEGATIVE
Glucose, UA: 2000
Leukocytes, UA: NEGATIVE
Nitrite, UA: POSITIVE
Spec Grav, UA: 1.015 (ref 1.010–1.025)
Urobilinogen, UA: 1 E.U./dL
pH, UA: 6 (ref 5.0–8.0)

## 2016-05-29 LAB — POCT GLYCOSYLATED HEMOGLOBIN (HGB A1C): Hemoglobin A1C: 10.7

## 2016-05-29 MED ORDER — CEPHALEXIN 500 MG PO CAPS: 500.0000 mg | ORAL_CAPSULE | Freq: Four times a day (QID) | ORAL | 0 refills | Status: DC

## 2016-05-29 MED ORDER — GLIPIZIDE 5 MG PO TABS: 5.0000 mg | ORAL_TABLET | Freq: Two times a day (BID) | ORAL | 2 refills | Status: DC

## 2016-05-29 NOTE — Progress Notes (Signed)
Subjective:     Patient ID: Christian Berg, male   DOB: 12/16/1950, 66 y.o.   MRN: [id number]  HPI  Chief Complaint  Patient presents with  . Skin Problem    Patient comes in office today with concerns of a possible "bump" that has been present on the left buttock and in between both cheeks for over the past two weeks. Paitent reports that area is raised, tender and warm to the touch and has had some slight bleeding from site. Last night patient had fever high of 100.2, patient also reports redness and irritation on left side of groin he has been using otc antifungal cream.   . Diabetes    Patient returns for follow up, last office visit was 03/06/15 patients HgbA1C in house was 8.6%. Patient reports poor diet and unable to exercise due to pain in legs. Patient denies any hyperglcemia incidents or changes in feet. Patient reports good compliance and tolerance on medication.   . Leg Pain    Patient complains of bilateral leg pain for the past several days, patient states that he had difficulty walking from his bed to the bathroom, and wife who is present states that patient has had urinary accidents.   States he ran out of glipizide and did not think to call for refills. Reports he can't control his consumption of sweets. He also has had a rash on his lower back and below his pannus. Has tried Nystatin cream without success. Describes leg pain as "bone pain" and unrelated to his hx of chronic low back pain. [place] for pain management. He is accompanied by his wife today.    Review of Systems  Genitourinary: Positive for frequency and urgency.       Objective:   Physical Exam  Constitutional: He appears well-developed and well-nourished. No distress.  Musculoskeletal:  M.S. In lower extremities 5/5  Skin:  Left abdominal intertrigo area with scaly rash c/w Tinea. Superficial rash on mid back c/w heat rash/ Entire bilateral buttock area erythematous with upper third  indurated/tender to the touch. No specific area of pointing or drainage but has a small eschar on left buttock which may have been bacterial entry point.       Assessment:    1. Type 2 diabetes mellitus without complication, without long-term current use of insulin ([facility]) - POCT glycosylated hemoglobin (Hb A1C) - glipiZIDE (GLUCOTROL) 5 MG tablet; Take 1 tablet (5 mg total) by mouth 2 (two) times daily before a meal.  Dispense: 60 tablet; Refill: 2  2. Urinary frequency - POCT urinalysis dipstick - Urine culture  3. Urinary urgency - POCT urinalysis dipstick - Urine culture  4. Cellulitis of buttock - cephALEXin (KEFLEX) 500 MG capsule; Take 1 capsule (500 mg total) by mouth 4 (four) times daily.  Dispense: 28 capsule; Refill: 0  5. Tinea corporis: discussed use of otc clotriamzole  6. Heat rash  7. Leg pain, bilateral: ? Secondary to infection ? Statin related    Plan:    Discussed use powder for his back. If leg pain not improving with treatment of cellulitis to have trial off lovastatin.

## 2016-05-29 NOTE — Patient Instructions (Signed)
We will call you with the urine culture. Try powder for probable heat rash on your back. Start antifungal (like clotrimazole) for belly rash. Start glipizide again. If leg pain is not improving with antibiotics try coming of lovastatin for a week .

## 2016-06-01 LAB — URINE CULTURE

## 2016-06-02 ENCOUNTER — Telehealth: Payer: Self-pay | Admitting: Family Medicine

## 2016-06-02 DIAGNOSIS — I10 Essential (primary) hypertension: Secondary | ICD-10-CM | POA: Diagnosis not present

## 2016-06-02 DIAGNOSIS — I6523 Occlusion and stenosis of bilateral carotid arteries: Secondary | ICD-10-CM | POA: Diagnosis not present

## 2016-06-02 DIAGNOSIS — I482 Chronic atrial fibrillation: Secondary | ICD-10-CM | POA: Diagnosis not present

## 2016-06-02 DIAGNOSIS — E782 Mixed hyperlipidemia: Secondary | ICD-10-CM | POA: Diagnosis not present

## 2016-06-02 DIAGNOSIS — I495 Sick sinus syndrome: Secondary | ICD-10-CM | POA: Diagnosis not present

## 2016-06-02 DIAGNOSIS — Z6841 Body Mass Index (BMI) 40.0 and over, adult: Secondary | ICD-10-CM | POA: Diagnosis not present

## 2016-06-02 DIAGNOSIS — I739 Peripheral vascular disease, unspecified: Secondary | ICD-10-CM | POA: Diagnosis not present

## 2016-06-02 NOTE — Telephone Encounter (Signed)
I looked at results of urine culture and infection IS susceptible to cephalexin that was prescribed by Mikki Santee last week. He does not need to change medications.

## 2016-06-02 NOTE — Telephone Encounter (Signed)
Patient uses [facility].

## 2016-06-03 NOTE — Telephone Encounter (Signed)
Please see my previous message in this thread.

## 2016-06-03 NOTE — Telephone Encounter (Signed)
LMOVM for pt to return call 

## 2016-06-03 NOTE — Telephone Encounter (Signed)
[id number] 

## 2016-06-03 NOTE — Telephone Encounter (Signed)
Pt has called back.  He said he finished the antiobiobic and wants to know if you Dr. Caryn Section is going to call another antiobiotic in .   He uses [facility]

## 2016-06-03 NOTE — Telephone Encounter (Signed)
Please advise 

## 2016-06-06 ENCOUNTER — Ambulatory Visit: Payer: Self-pay

## 2016-06-06 ENCOUNTER — Ambulatory Visit: Payer: Medicare Other | Admitting: Family Medicine

## 2016-06-06 ENCOUNTER — Ambulatory Visit (INDEPENDENT_AMBULATORY_CARE_PROVIDER_SITE_OTHER): Payer: Medicare Other | Admitting: Emergency Medicine

## 2016-06-06 DIAGNOSIS — I482 Chronic atrial fibrillation, unspecified: Secondary | ICD-10-CM

## 2016-06-06 LAB — POCT INR
INR: 3.8
PT: 46

## 2016-06-06 NOTE — Patient Instructions (Signed)
Anticoagulation Warfarin Dose Instructions as of 06/06/2016      Christian Berg Tue Wed Thu Fri Sat   New Dose 7.5 mg 7.5 mg 7.5 mg 7.5 mg 7.5 mg Hold Hold    Description   Hold for 2 days then continue 7.5 mg daily and re check in 2 weeks.

## 2016-06-09 ENCOUNTER — Encounter: Payer: Self-pay | Admitting: Family Medicine

## 2016-06-09 ENCOUNTER — Ambulatory Visit (INDEPENDENT_AMBULATORY_CARE_PROVIDER_SITE_OTHER): Payer: Medicare Other | Admitting: Family Medicine

## 2016-06-09 VITALS — BP 110/78 | HR 103 | Temp 98.9°F | Resp 17 | Wt 299.8 lb

## 2016-06-09 DIAGNOSIS — L03317 Cellulitis of buttock: Secondary | ICD-10-CM

## 2016-06-09 DIAGNOSIS — R350 Frequency of micturition: Secondary | ICD-10-CM

## 2016-06-09 LAB — POCT URINALYSIS DIPSTICK
Bilirubin, UA: NEGATIVE
Glucose, UA: 2000
Ketones, UA: NEGATIVE
Nitrite, UA: NEGATIVE
Spec Grav, UA: 1.02 (ref 1.010–1.025)
Urobilinogen, UA: 1 E.U./dL
pH, UA: 5 (ref 5.0–8.0)

## 2016-06-09 MED ORDER — CEPHALEXIN 500 MG PO CAPS
500.0000 mg | ORAL_CAPSULE | Freq: Four times a day (QID) | ORAL | 0 refills | Status: DC
Start: 2016-06-09 — End: 2016-10-04

## 2016-06-09 NOTE — Patient Instructions (Signed)
We will call you with the urine culture result.

## 2016-06-09 NOTE — Progress Notes (Signed)
Subjective:     Patient ID: Christian Berg, male   DOB: 1950-09-07, 66 y.o.   MRN: [id number]  HPI  Chief Complaint  Patient presents with  . Urinary Frequency    Patient comes in office today to follow up for urinary infection that he was seen and treated for onm 05/29/16. Patient urine culture did show e.coli and patient was treated with Keflex 72m. Patient reports that urinary frequency has improved but he has had diarrhea for the past week and been more gassy.   . Cellulitis    Patient returns for follow up visit from 05/29/16,  patient was started on Keflex 5051mand completed antibiotic. Patient reports additional redness of skin and burning in his left flank area.   Accompanied by his wife, DoTamela Odditoday.States he spends much of his time in a lift chair at home.   Review of Systems     Objective:   Physical Exam  Constitutional: He appears well-developed and well-nourished. No distress.  Skin:  Bilateral buttock erythema has resolved with residual non-tender, raised cyst in his left buttock area. He has a fading well; marginated rash without scaling in his left flank area.       Assessment:    1. Cellulitis of buttock: continue abx for another week - cephALEXin (KEFLEX) 500 MG capsule; Take 1 capsule (500 mg total) by mouth 4 (four) times daily.  Dispense: 28 capsule; Refill: 0  2. Urinary frequency - POCT urinalysis dipstick - Urine culture    Plan:    Further f/u pending urine culture. Rx for egg crate pad for his chair at home.

## 2016-06-11 LAB — URINE CULTURE

## 2016-06-20 ENCOUNTER — Other Ambulatory Visit: Payer: Self-pay | Admitting: Family Medicine

## 2016-06-20 ENCOUNTER — Ambulatory Visit (INDEPENDENT_AMBULATORY_CARE_PROVIDER_SITE_OTHER): Payer: Medicare Other

## 2016-06-20 DIAGNOSIS — I482 Chronic atrial fibrillation, unspecified: Secondary | ICD-10-CM

## 2016-06-20 DIAGNOSIS — E119 Type 2 diabetes mellitus without complications: Secondary | ICD-10-CM

## 2016-06-20 LAB — POCT INR
INR: 2.5
PT: 30

## 2016-06-20 MED ORDER — GLIPIZIDE 5 MG PO TABS: 5.0000 mg | ORAL_TABLET | Freq: Two times a day (BID) | ORAL | 0 refills | Status: DC

## 2016-06-20 NOTE — Telephone Encounter (Signed)
[facility] faxed a 90 day supply refill request on the following medications:  [facility] [address]    glipiZIDE (GLUCOTROL) 5 MG tablet/MW

## 2016-06-24 DIAGNOSIS — S42292G Other displaced fracture of upper end of left humerus, subsequent encounter for fracture with delayed healing: Secondary | ICD-10-CM | POA: Diagnosis not present

## 2016-06-30 DIAGNOSIS — I10 Essential (primary) hypertension: Secondary | ICD-10-CM | POA: Diagnosis not present

## 2016-06-30 DIAGNOSIS — I482 Chronic atrial fibrillation: Secondary | ICD-10-CM | POA: Diagnosis not present

## 2016-06-30 DIAGNOSIS — I739 Peripheral vascular disease, unspecified: Secondary | ICD-10-CM | POA: Diagnosis not present

## 2016-06-30 DIAGNOSIS — I6523 Occlusion and stenosis of bilateral carotid arteries: Secondary | ICD-10-CM | POA: Diagnosis not present

## 2016-07-07 ENCOUNTER — Ambulatory Visit: Payer: Medicare Other | Admitting: Family Medicine

## 2016-07-10 ENCOUNTER — Telehealth: Payer: Self-pay | Admitting: Family Medicine

## 2016-07-10 NOTE — Telephone Encounter (Signed)
Opened in Error.

## 2016-07-18 ENCOUNTER — Ambulatory Visit (INDEPENDENT_AMBULATORY_CARE_PROVIDER_SITE_OTHER): Payer: Medicare Other

## 2016-07-18 DIAGNOSIS — I482 Chronic atrial fibrillation, unspecified: Secondary | ICD-10-CM

## 2016-07-18 LAB — POCT INR
INR: 2.2
PT: 26

## 2016-07-22 ENCOUNTER — Other Ambulatory Visit: Payer: Self-pay | Admitting: Family Medicine

## 2016-08-04 DIAGNOSIS — I495 Sick sinus syndrome: Secondary | ICD-10-CM | POA: Diagnosis not present

## 2016-08-04 DIAGNOSIS — I5042 Chronic combined systolic (congestive) and diastolic (congestive) heart failure: Secondary | ICD-10-CM | POA: Diagnosis not present

## 2016-08-04 DIAGNOSIS — I10 Essential (primary) hypertension: Secondary | ICD-10-CM | POA: Diagnosis not present

## 2016-08-04 DIAGNOSIS — I482 Chronic atrial fibrillation: Secondary | ICD-10-CM | POA: Diagnosis not present

## 2016-08-04 DIAGNOSIS — Z6841 Body Mass Index (BMI) 40.0 and over, adult: Secondary | ICD-10-CM | POA: Diagnosis not present

## 2016-08-08 ENCOUNTER — Other Ambulatory Visit: Payer: Self-pay | Admitting: Family Medicine

## 2016-08-08 ENCOUNTER — Telehealth: Payer: Self-pay | Admitting: Family Medicine

## 2016-08-08 DIAGNOSIS — E785 Hyperlipidemia, unspecified: Principal | ICD-10-CM

## 2016-08-08 DIAGNOSIS — E1169 Type 2 diabetes mellitus with other specified complication: Secondary | ICD-10-CM

## 2016-08-08 MED ORDER — WARFARIN SODIUM 7.5 MG PO TABS: 7.5000 mg | ORAL_TABLET | Freq: Every day | ORAL | 3 refills | Status: DC

## 2016-08-08 MED ORDER — LOVASTATIN 10 MG PO TABS: 20.0000 mg | ORAL_TABLET | Freq: Every day | ORAL | 1 refills | Status: DC

## 2016-08-08 NOTE — Telephone Encounter (Signed)
Pt needs refill   Lovastatin 10 mg #  180  Warfarin 7.5 mg  # [place]  Thank sTeri

## 2016-08-08 NOTE — Telephone Encounter (Signed)
Please review. KW 

## 2016-08-08 NOTE — Telephone Encounter (Signed)
Medication refilled

## 2016-08-15 ENCOUNTER — Ambulatory Visit (INDEPENDENT_AMBULATORY_CARE_PROVIDER_SITE_OTHER): Payer: Medicare Other | Admitting: Emergency Medicine

## 2016-08-15 DIAGNOSIS — I482 Chronic atrial fibrillation, unspecified: Secondary | ICD-10-CM

## 2016-08-15 LAB — POCT INR
INR: 3
PT: 35.4

## 2016-08-15 NOTE — Patient Instructions (Signed)
Anticoagulation Warfarin Dose Instructions as of 08/15/2016      Christian Berg Tue Wed Thu Fri Sat   New Dose 7.5 mg 7.5 mg 7.5 mg 7.5 mg 7.5 mg 0 mg 0 mg    Description   continue 7.5 mg daily and re check in  4 weeks.

## 2016-09-11 ENCOUNTER — Telehealth: Payer: Self-pay

## 2016-09-11 NOTE — Telephone Encounter (Signed)
Patient called saying that he has had a sore throat X 2 weeks. He states that it seemed to have gotten better, but when he woke up this morning, his sore throat is back. He denies any exposure to strep. He does have history of allergies. He has not been taking anything OTC for this. He is wanting to know if he should be referred to ENT because he has had a sore throat for so long? Or should he be seen here in the office? He does have an appt tomorrow for PT/INR check. Please advise. Thanks!

## 2016-09-11 NOTE — Telephone Encounter (Signed)
If he is having post nasal drainage or other allergy symptoms to start generic Flonase nasal spray over the counter. If he is having heartburn or throat is sore when he first awakens in the AM to start Prevacid 15 mg two pills daily.

## 2016-09-11 NOTE — Telephone Encounter (Signed)
Advised patient as below.  

## 2016-09-12 ENCOUNTER — Ambulatory Visit (INDEPENDENT_AMBULATORY_CARE_PROVIDER_SITE_OTHER): Payer: Medicare Other

## 2016-09-12 DIAGNOSIS — I482 Chronic atrial fibrillation, unspecified: Secondary | ICD-10-CM

## 2016-09-12 LAB — POCT INR
INR: 2.2
PT: 26.8

## 2016-09-19 ENCOUNTER — Other Ambulatory Visit: Payer: Self-pay | Admitting: Family Medicine

## 2016-09-19 DIAGNOSIS — E119 Type 2 diabetes mellitus without complications: Secondary | ICD-10-CM

## 2016-09-19 MED ORDER — GLIPIZIDE 5 MG PO TABS: 5.0000 mg | ORAL_TABLET | Freq: Two times a day (BID) | ORAL | 0 refills | Status: DC

## 2016-09-19 NOTE — Telephone Encounter (Signed)
[facility] faxed a request for a 90-days supply for the following medication. Thanks CC  glipiZIDE (GLUCOTROL) 5 MG tablet  >Take 1 (one) tablet by mouth twice daily before meals

## 2016-10-04 ENCOUNTER — Ambulatory Visit (INDEPENDENT_AMBULATORY_CARE_PROVIDER_SITE_OTHER): Payer: Medicare Other | Admitting: Family Medicine

## 2016-10-04 ENCOUNTER — Encounter: Payer: Self-pay | Admitting: Family Medicine

## 2016-10-04 VITALS — BP 148/88 | HR 92 | Temp 98.8°F | Resp 16 | Wt 300.0 lb

## 2016-10-04 DIAGNOSIS — N3001 Acute cystitis with hematuria: Secondary | ICD-10-CM | POA: Diagnosis not present

## 2016-10-04 DIAGNOSIS — L03317 Cellulitis of buttock: Secondary | ICD-10-CM | POA: Diagnosis not present

## 2016-10-04 MED ORDER — CEPHALEXIN 500 MG PO CAPS: 500.0000 mg | ORAL_CAPSULE | Freq: Four times a day (QID) | ORAL | 0 refills | Status: DC

## 2016-10-04 NOTE — Patient Instructions (Signed)
Urinary Tract Infection, Adult A urinary tract infection (UTI) is an infection of any part of the urinary tract, which includes the kidneys, ureters, bladder, and urethra. These organs make, store, and get rid of urine in the body. UTI can be a bladder infection (cystitis) or kidney infection (pyelonephritis). What are the causes? This infection may be caused by fungi, viruses, or bacteria. Bacteria are the most common cause of UTIs. This condition can also be caused by repeated incomplete emptying of the bladder during urination. What increases the risk? This condition is more likely to develop if:  You ignore your need to urinate or hold urine for long periods of time.  You do not empty your bladder completely during urination.  You wipe back to front after urinating or having a bowel movement, if you are male.  You are uncircumcised, if you are male.  You are constipated.  You have a urinary catheter that stays in place (indwelling).  You have a weak defense (immune) system.  You have a medical condition that affects your bowels, kidneys, or bladder.  You have diabetes.  You take antibiotic medicines frequently or for long periods of time, and the antibiotics no longer work well against certain types of infections (antibiotic resistance).  You take medicines that irritate your urinary tract.  You are exposed to chemicals that irritate your urinary tract.  You are male.  What are the signs or symptoms? Symptoms of this condition include:  Fever.  Frequent urination or passing small amounts of urine frequently.  Needing to urinate urgently.  Pain or burning with urination.  Urine that smells bad or unusual.  Cloudy urine.  Pain in the lower abdomen or back.  Trouble urinating.  Blood in the urine.  Vomiting or being less hungry than normal.  Diarrhea or abdominal pain.  Vaginal discharge, if you are male.  How is this diagnosed? This condition is  diagnosed with a medical history and physical exam. You will also need to provide a urine sample to test your urine. Other tests may be done, including:  Blood tests.  Sexually transmitted disease (STD) testing.  If you have had more than one UTI, a cystoscopy or imaging studies may be done to determine the cause of the infections. How is this treated? Treatment for this condition often includes a combination of two or more of the following:  Antibiotic medicine.  Other medicines to treat less common causes of UTI.  Over-the-counter medicines to treat pain.  Drinking enough water to stay hydrated.  Follow these instructions at home:  Take over-the-counter and prescription medicines only as told by your health care provider.  If you were prescribed an antibiotic, take it as told by your health care provider. Do not stop taking the antibiotic even if you start to feel better.  Avoid alcohol, caffeine, tea, and carbonated beverages. They can irritate your bladder.  Drink enough fluid to keep your urine clear or pale yellow.  Keep all follow-up visits as told by your health care provider. This is important.  Make sure to: ? Empty your bladder often and completely. Do not hold urine for long periods of time. ? Empty your bladder before and after sex. ? Wipe from front to back after a bowel movement if you are male. Use each tissue one time when you wipe. Contact a health care provider if:  You have back pain.  You have a fever.  You feel nauseous or vomit.  Your symptoms do not  get better after 3 days.  Your symptoms go away and then return. Get help right away if:  You have severe back pain or lower abdominal pain.  You are vomiting and cannot keep down any medicines or water. This information is not intended to replace advice given to you by your health care provider. Make sure you discuss any questions you have with your health care provider. Document Released:  10/23/2004 Document Revised: 06/27/2015 Document Reviewed: 12/04/2014 Elsevier Interactive Patient Education  2017 Elsevier Inc.    Cellulitis, Adult Cellulitis is a skin infection. The infected area is usually red and tender. This condition occurs most often in the arms and lower legs. The infection can travel to the muscles, blood, and underlying tissue and become serious. It is very important to get treated for this condition. What are the causes? Cellulitis is caused by bacteria. The bacteria enter through a break in the skin, such as a cut, burn, insect bite, open sore, or crack. What increases the risk? This condition is more likely to occur in people who:  Have a weak defense system (immune system).  Have open wounds on the skin such as cuts, burns, bites, and scrapes. Bacteria can enter the body through these open wounds.  Are older.  Have diabetes.  Have a type of long-lasting (chronic) liver disease (cirrhosis) or kidney disease.  Use IV drugs.  What are the signs or symptoms? Symptoms of this condition include:  Redness, streaking, or spotting on the skin.  Swollen area of the skin.  Tenderness or pain when an area of the skin is touched.  Warm skin.  Fever.  Chills.  Blisters.  How is this diagnosed? This condition is diagnosed based on a medical history and physical exam. You may also have tests, including:  Blood tests.  Lab tests.  Imaging tests.  How is this treated? Treatment for this condition may include:  Medicines, such as antibiotic medicines or antihistamines.  Supportive care, such as rest and application of cold or warm cloths (cold or warm compresses) to the skin.  Hospital care, if the condition is severe.  The infection usually gets better within 1-2 days of treatment. Follow these instructions at home:  Take over-the-counter and prescription medicines only as told by your health care provider.  If you were prescribed an  antibiotic medicine, take it as told by your health care provider. Do not stop taking the antibiotic even if you start to feel better.  Drink enough fluid to keep your urine clear or pale yellow.  Do not touch or rub the infected area.  Raise (elevate) the infected area above the level of your heart while you are sitting or lying down.  Apply warm or cold compresses to the affected area as told by your health care provider.  Keep all follow-up visits as told by your health care provider. This is important. These visits let your health care provider make sure a more serious infection is not developing. Contact a health care provider if:  You have a fever.  Your symptoms do not improve within 1-2 days of starting treatment.  Your bone or joint underneath the infected area becomes painful after the skin has healed.  Your infection returns in the same area or another area.  You notice a swollen bump in the infected area.  You develop new symptoms.  You have a general ill feeling (malaise) with muscle aches and pains. Get help right away if:  Your symptoms get worse.  You feel very sleepy.  You develop vomiting or diarrhea that persists.  You notice red streaks coming from the infected area.  Your red area gets larger or turns dark in color. This information is not intended to replace advice given to you by your health care provider. Make sure you discuss any questions you have with your health care provider. Document Released: 10/23/2004 Document Revised: 05/24/2015 Document Reviewed: 11/22/2014 Elsevier Interactive Patient Education  2017 [employer].

## 2016-10-04 NOTE — Progress Notes (Signed)
Patient: Christian Berg Male    DOB: Nov 02, 1950   66 y.o.   MRN: [id number] Visit Date: 10/04/2016  Today's Provider: Lavon Paganini, MD   Chief Complaint  Patient presents with  . Fever    Started yesterday 102.6 yesterday   Subjective:    Fever   This is a new problem. The current episode started yesterday. The problem occurs constantly. The problem has been unchanged. The maximum temperature noted was 102 to 102.9 F. The temperature was taken using an oral thermometer. Associated symptoms include headaches (Had a headache last night), muscle aches and a rash (Pt reports he is treating "an infection" under his abdomen. And several "Sores" from DSAP.). Pertinent negatives include no abdominal pain, chest pain, congestion, coughing, diarrhea, ear pain, nausea, sleepiness, sore throat, urinary pain, vomiting or wheezing. He has tried acetaminophen for the symptoms. The treatment provided moderate relief.    Frequent urination, unable to make it to the bathroom yesterday on time, some suprapubic pain, no dysuria.  Wife describes him as slightly lethargic and not quite himself, but not confused.  He has been using Nystatin, triamcinolone cream under pannus for intertrigo.  Wife says this is looking better.  He has a sore on his buttock from DSAP that has been infected in the past.  He improved with PO abx and did not require hospitalization for this last time.       Allergies  Allergen Reactions  . Sulfa Antibiotics Hives     Current Outpatient Prescriptions:  .  aspirin EC 81 MG tablet, Take 81 mg by mouth at bedtime. , Disp: , Rfl:  .  diltiazem (CARDIZEM) 60 MG tablet, Take 1 tablet (60 mg total) by mouth every 8 (eight) hours., Disp: 90 tablet, Rfl: 0 .  fluorouracil (EFUDEX) 5 % cream, Apply 1 application topically 2 (two) times daily. As needed for DSAP, Disp: 80 g, Rfl: 5 .  glipiZIDE (GLUCOTROL) 5 MG tablet, Take 1 tablet (5 mg total) by mouth 2 (two) times daily before  a meal., Disp: 180 tablet, Rfl: 0 .  lisinopril (PRINIVIL,ZESTRIL) 20 MG tablet, Take 1 tablet (20 mg total) by mouth daily., Disp: 90 tablet, Rfl: 3 .  lovastatin (MEVACOR) 10 MG tablet, Take 2 tablets (20 mg total) by mouth daily., Disp: 180 tablet, Rfl: 1 .  metFORMIN (GLUCOPHAGE) 850 MG tablet, TAKE ONE TABLET BY MOUTH TWICE DAILY WITH MEALS, Disp: 180 tablet, Rfl: 1 .  metoprolol (LOPRESSOR) 100 MG tablet, TAKE ONE TABLET BY MOUTH TWICE DAILY (Patient taking differently: TAKE ONE TABLET BY MOUTH TWICE DAILY 250 mg in the AM and 200 mg PM changed by cardiology), Disp: 180 tablet, Rfl: 3 .  nystatin cream (MYCOSTATIN), Apply 1 application topically 2 (two) times daily. (Patient taking differently: Apply 1 application topically 2 (two) times daily as needed. ), Disp: 30 g, Rfl: 0 .  traMADol (ULTRAM) 50 MG tablet, Take 50 mg by mouth 2 (two) times daily., Disp: , Rfl:  .  warfarin (COUMADIN) 7.5 MG tablet, Take 1 tablet (7.5 mg total) by mouth daily., Disp: 90 tablet, Rfl: 3  Review of Systems  Constitutional: Positive for chills, diaphoresis, fatigue and fever. Negative for activity change, appetite change and unexpected weight change.  HENT: Negative for congestion, ear pain and sore throat.   Eyes: Negative.   Respiratory: Negative.  Negative for cough and wheezing.   Cardiovascular: Negative for chest pain, palpitations and leg swelling.  Gastrointestinal: Negative.  Negative for  abdominal pain, diarrhea, nausea and vomiting.  Genitourinary: Positive for frequency and urgency. Negative for dysuria, hematuria and testicular pain.  Musculoskeletal: Positive for arthralgias, back pain, gait problem and myalgias.  Skin: Positive for rash (Pt reports he is treating "an infection" under his abdomen. And several "Sores" from DSAP.) and wound.  Neurological: Positive for weakness and headaches (Had a headache last night). Negative for dizziness, tremors, syncope, facial asymmetry, speech difficulty,  light-headedness and numbness.  Psychiatric/Behavioral: Negative.     Social History  Substance Use Topics  . Smoking status: Never Smoker  . Smokeless tobacco: Never Used  . Alcohol use No   Objective:   BP (!) 148/88 (BP Location: Left Arm, Patient Position: Sitting, Cuff Size: Large)   Pulse 92   Temp 98.8 F (37.1 C) (Oral)   Resp 16   Wt 300 lb (136.1 kg)   SpO2 97%   BMI 45.61 kg/m  Vitals:   10/04/16 1007  BP: (!) 148/88  Pulse: 92  Resp: 16  Temp: 98.8 F (37.1 C)  TempSrc: Oral  SpO2: 97%  Weight: 300 lb (136.1 kg)     Physical Exam  Constitutional: He is oriented to person, place, and time. He appears well-developed and well-nourished.  Appears to feel badly  HENT:  Head: Normocephalic and atraumatic.  Right Ear: External ear normal.  Left Ear: External ear normal.  Nose: Nose normal.  Mouth/Throat: Oropharynx is clear and moist. No oropharyngeal exudate.  Eyes: Pupils are equal, round, and reactive to light. Conjunctivae are normal. No scleral icterus.  Neck: Neck supple.  Cardiovascular: Normal rate.   No murmur heard. irreg irreg rhythm  Pulmonary/Chest: Effort normal and breath sounds normal. No respiratory distress. He has no wheezes. He has no rales.  Abdominal: Soft. Bowel sounds are normal. He exhibits no distension. There is no tenderness. There is no rebound and no guarding.  No CVA tenderness  Musculoskeletal: He exhibits no edema or deformity.  Lymphadenopathy:    He has no cervical adenopathy.  Neurological: He is alert and oriented to person, place, and time.  Skin:  Erythematous rash on buttock and lower back that surrounds open shallow ulcer on L buttock, warm to the touch Small amount of intertrigo under pannus Intertrigo of L axilla  Psychiatric: He has a normal mood and affect. His behavior is normal.      Assessment & Plan:     1. Cellulitis of buttock - exam consistent with cellulitis - patient is at higher risk due to  chronic skin wounds - not purulent, so unlikely MRSA - patient does appear well, but nontoxic - advised that he may be well suited to be checked out in the ED and get labs including CBC - patient refuses - cephALEXin (KEFLEX) 500 MG capsule; Take 1 capsule (500 mg total) by mouth 4 (four) times daily.  Dispense: 28 capsule; Refill: 0 - strict precautions regarding worsening discussed - close f/u with PCP  2. Acute cystitis with hematuria - UA c/w UTI and patient is symptomatic - Keflex will cover this and cellulitis - Urine Culture - Urinalysis, microscopic only - close f/u and precautions as above      The entirety of the information documented in the History of Present Illness, Review of Systems and Physical Exam were personally obtained by me. Portions of this information were initially documented by Ashley Royalty, CMA and reviewed by me for thoroughness and accuracy.     Lavon Paganini, MD  [facility]  [place]

## 2016-10-06 ENCOUNTER — Encounter: Payer: Self-pay | Admitting: Family Medicine

## 2016-10-06 ENCOUNTER — Ambulatory Visit (INDEPENDENT_AMBULATORY_CARE_PROVIDER_SITE_OTHER): Payer: Medicare Other | Admitting: Family Medicine

## 2016-10-06 VITALS — BP 136/84 | HR 92 | Temp 98.9°F | Resp 16 | Wt 301.0 lb

## 2016-10-06 DIAGNOSIS — E119 Type 2 diabetes mellitus without complications: Secondary | ICD-10-CM | POA: Diagnosis not present

## 2016-10-06 DIAGNOSIS — L03317 Cellulitis of buttock: Secondary | ICD-10-CM | POA: Diagnosis not present

## 2016-10-06 DIAGNOSIS — Z23 Encounter for immunization: Secondary | ICD-10-CM | POA: Diagnosis not present

## 2016-10-06 DIAGNOSIS — N3001 Acute cystitis with hematuria: Secondary | ICD-10-CM | POA: Diagnosis not present

## 2016-10-06 LAB — POCT GLYCOSYLATED HEMOGLOBIN (HGB A1C): Hemoglobin A1C: 9.5

## 2016-10-06 MED ORDER — CEPHALEXIN 500 MG PO CAPS: 500.0000 mg | ORAL_CAPSULE | Freq: Four times a day (QID) | ORAL | 0 refills | Status: DC

## 2016-10-06 MED ORDER — GLIPIZIDE 10 MG PO TABS: 10.0000 mg | ORAL_TABLET | Freq: Two times a day (BID) | ORAL | 1 refills | Status: DC

## 2016-10-06 NOTE — Progress Notes (Signed)
Subjective:     Patient ID: Christian Berg, male   DOB: August 29, 1950, 66 y.o.   MRN: [id number]  HPI  Chief Complaint  Patient presents with  . Cellulitis    Two day follow up  Also was treated for a UTI but culture not available yet. Feels much better. Also he is  treating intertrigo in pannus and left axilla with Nystatin. Accompanied by his wife today. Overdue for diabetes f/u-reports burning foot dysesthesias.   Review of Systems     Objective:   Physical Exam  Constitutional: He appears well-developed and well-nourished. No distress.  Skin:  Buttock area no longer erythematous with minimal tenderness. ? DSAP lesions present which may be entry for infection. Left axilla with confluent well demarcated rash. No scaling noted.       Assessment:    1. Type 2 diabetes mellitus without complication, without long-term current use of insulin ([place]): Will increase medication for improved control. - POCT glycosylated hemoglobin (Hb A1C) - glipiZIDE (GLUCOTROL) 10 MG tablet; Take 1 tablet (10 mg total) by mouth 2 (two) times daily before a meal. Take 30 minutes before a meal  Dispense: 180 tablet; Refill: 1  2. Cellulitis of buttock; resolved. Refill medication if recurrence - cephALEXin (KEFLEX) 500 MG capsule; Take 1 capsule (500 mg total) by mouth 4 (four) times daily.  Dispense: 28 capsule; Refill: 0  3. Need for influenza vaccination - Flu vaccine HIGH DOSE PF (Fluzone High dose)    Plan:    Reminded to take glipizide 30 minutes before a meal.

## 2016-10-06 NOTE — Patient Instructions (Signed)
Take glipizide 30 minutes before your two biggest meals of the day.

## 2016-10-07 LAB — URINALYSIS, MICROSCOPIC ONLY
Bacteria, UA: NONE SEEN /HPF
Hyaline Cast: NONE SEEN /LPF
Squamous Epithelial / LPF: NONE SEEN /HPF (ref <=5)
WBC, UA: >=60 /HPF — AB (ref 0–5)

## 2016-10-07 LAB — URINE CULTURE
MICRO NUMBER:: 80993269
SPECIMEN QUALITY:: ADEQUATE

## 2016-10-08 ENCOUNTER — Telehealth: Payer: Self-pay

## 2016-10-08 NOTE — Telephone Encounter (Signed)
-----   Message from Virginia Crews, MD sent at 10/08/2016 10:02 AM EDT ----- No specific urinary tract infection found on culture.  The fever was likely from the cellulitis of the back.  Virginia Crews, MD, MPH [facility] 10/08/2016 10:02 AM

## 2016-10-08 NOTE — Telephone Encounter (Signed)
lmtcb

## 2016-10-10 ENCOUNTER — Ambulatory Visit: Payer: Medicare Other

## 2016-10-14 NOTE — Telephone Encounter (Signed)
Patient advised on his voicemail as below-Shawn Carattini V Alianny Toelle, RMA

## 2016-10-15 IMAGING — CR DG CHEST 2V
2 series · 2 of 2 positions shown · non-contrast
Comparison: 09/18/2013

CLINICAL DATA: Fever and tachycardia with chills

EXAM:
CHEST - 2 VIEW

[chest pa]
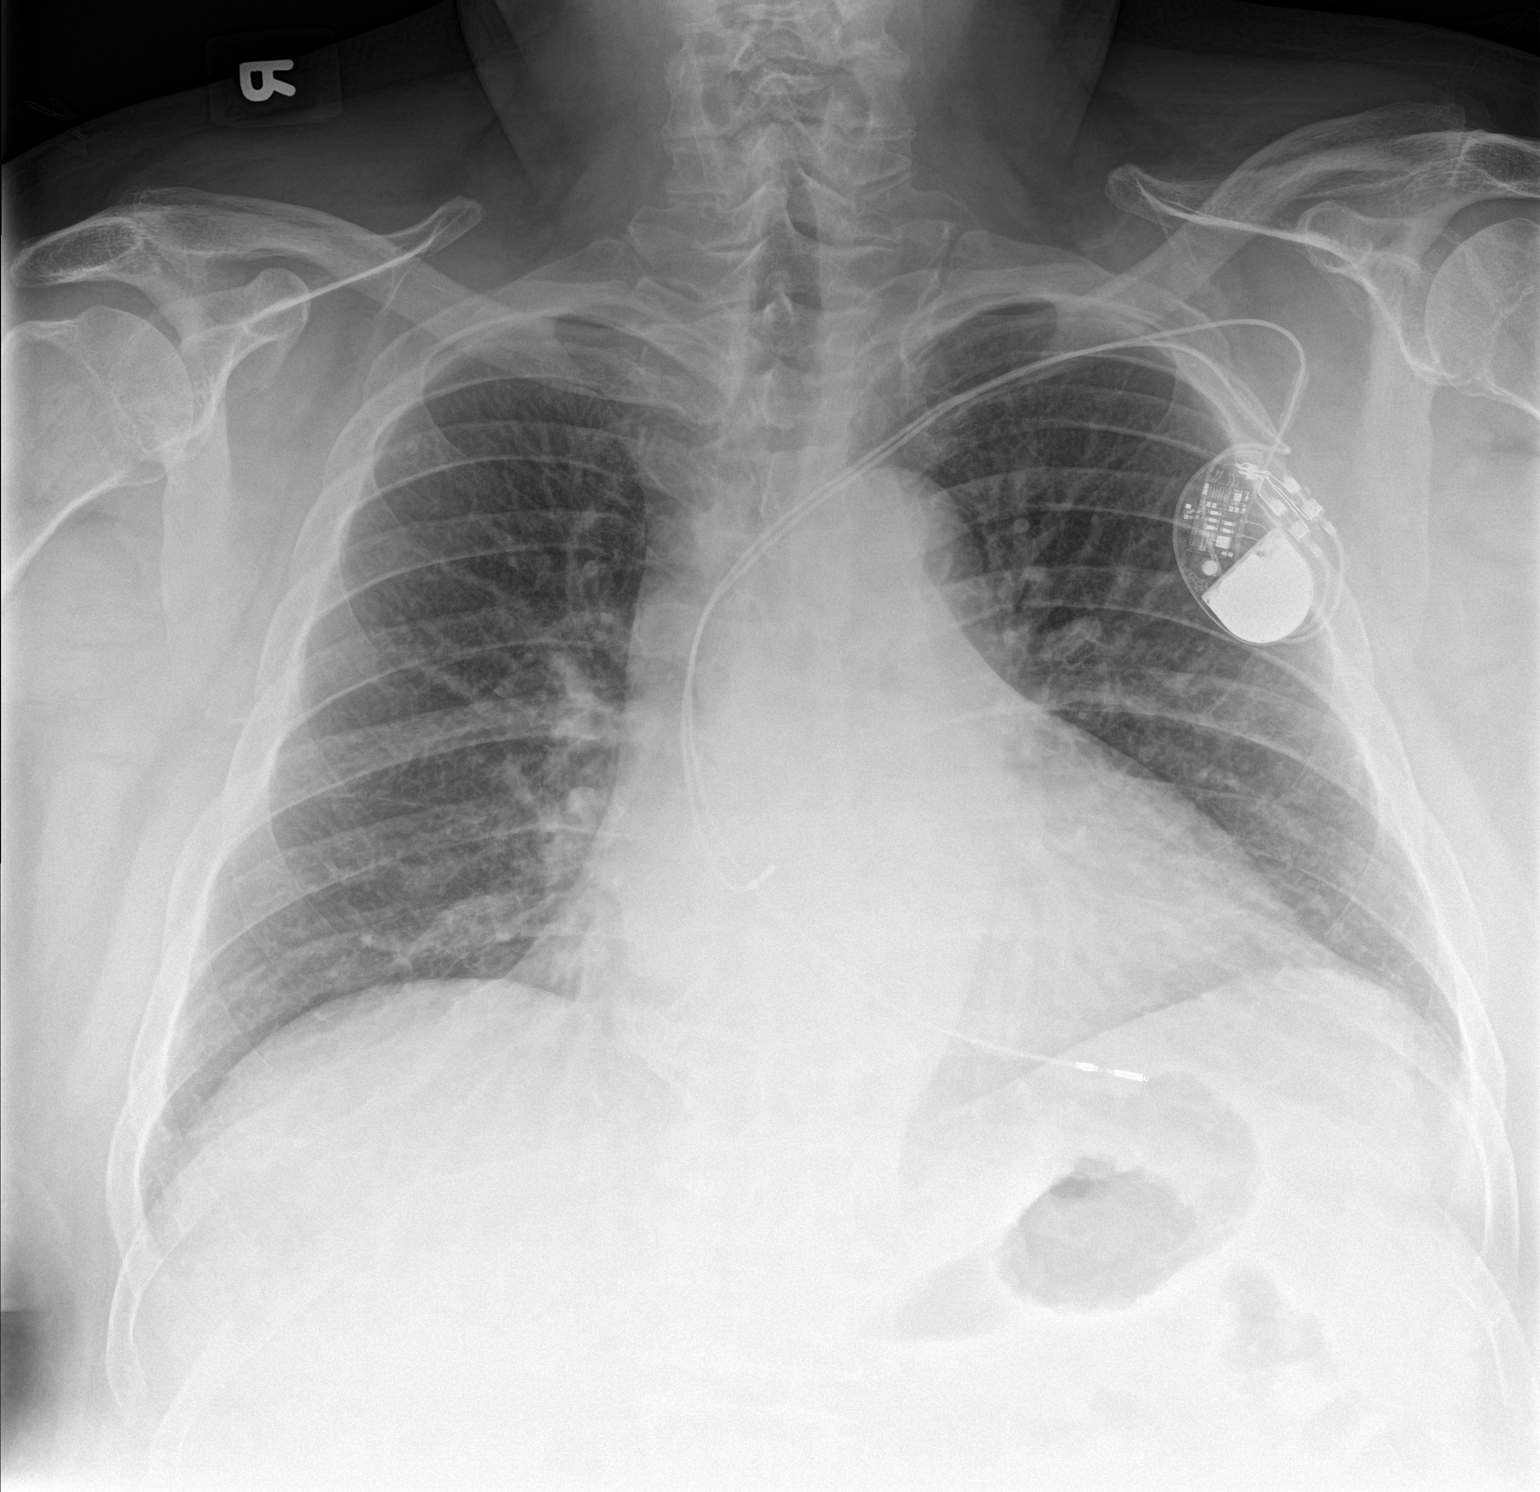

[chest lat]
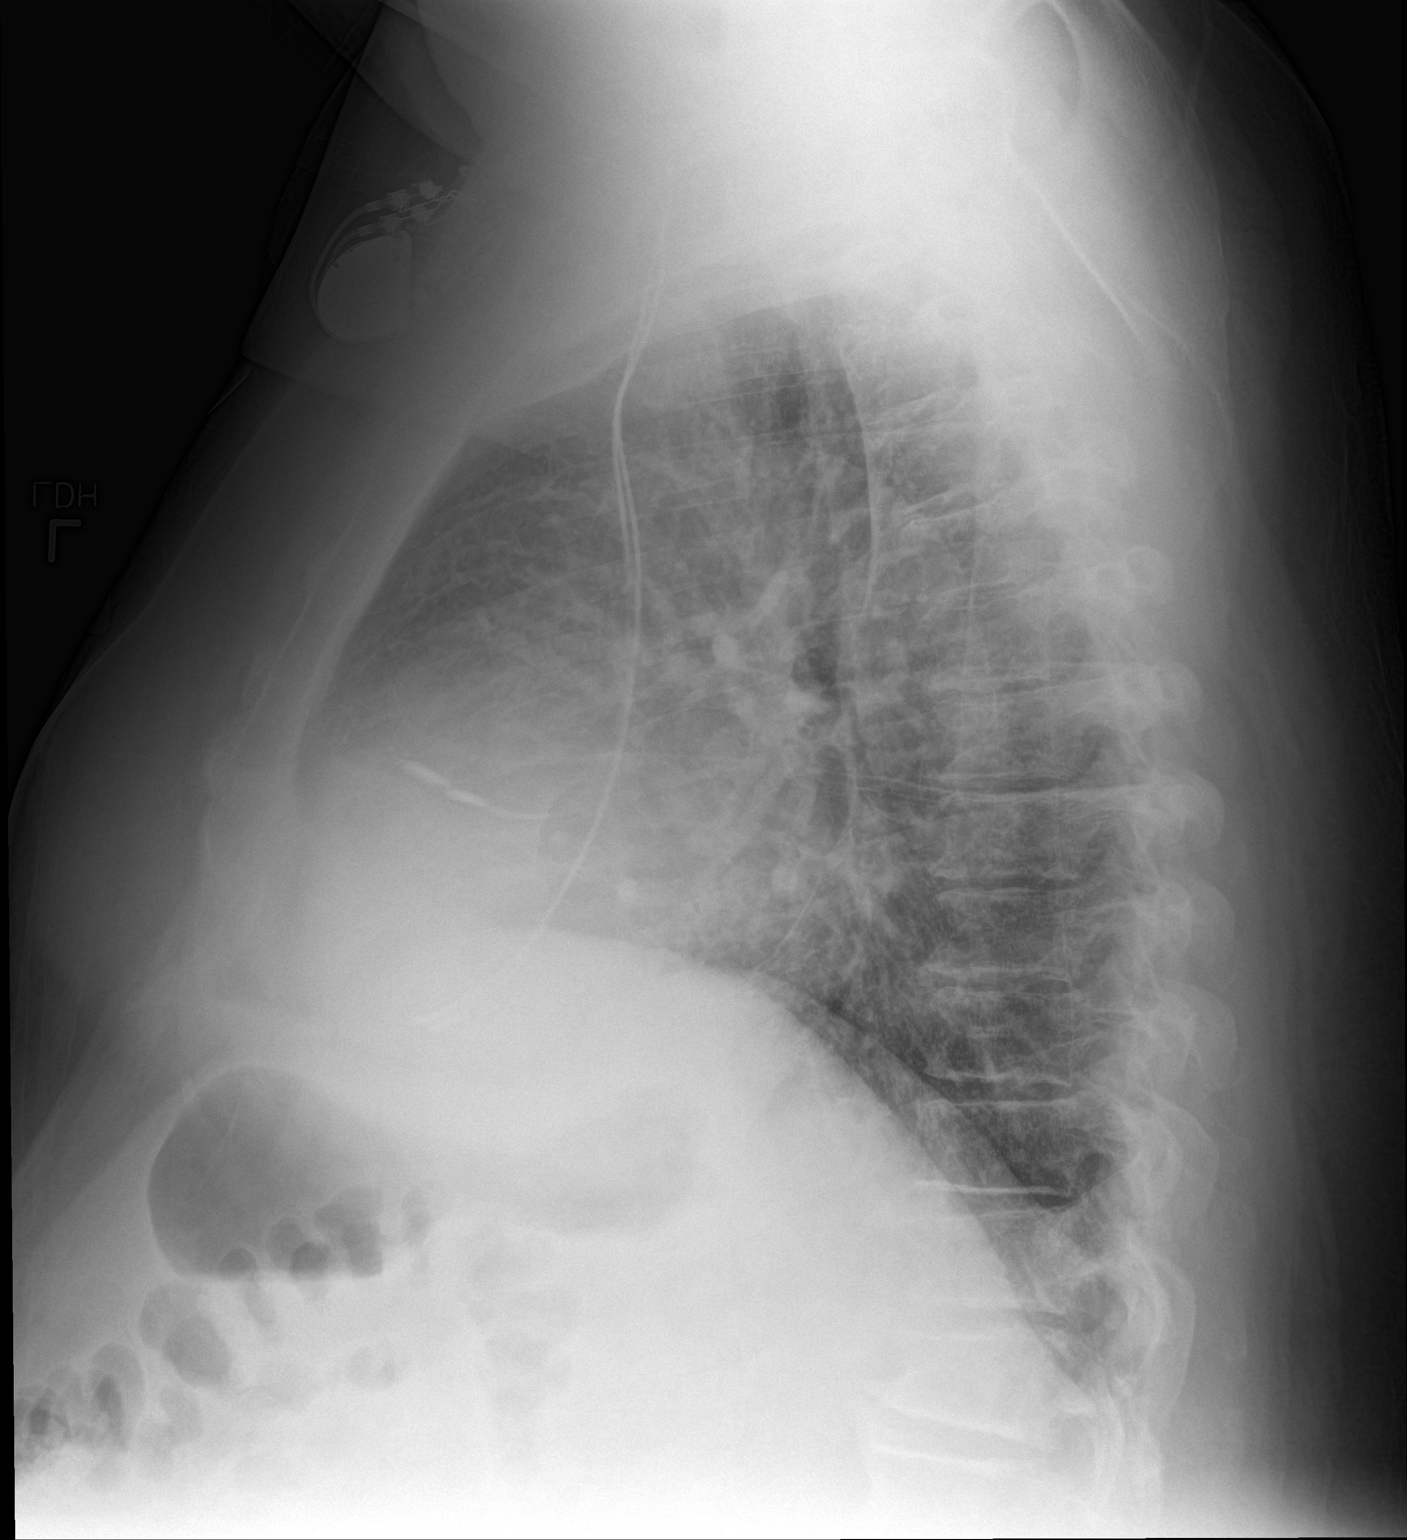

[2 of 2 positions shown; findings below may reference images not displayed]

FINDINGS: Cardiac shadow remains mildly enlarged. A pacing device is again
seen and stable. The lungs are well aerated bilaterally. No focal
infiltrate or sizable effusion is seen. No acute bony abnormality is
noted.
IMPRESSION: No acute abnormality seen.

## 2016-10-21 ENCOUNTER — Encounter: Payer: Self-pay | Admitting: Family Medicine

## 2016-10-22 ENCOUNTER — Encounter: Payer: Self-pay | Admitting: Emergency Medicine

## 2016-10-22 ENCOUNTER — Ambulatory Visit: Payer: Medicare Other

## 2016-10-22 ENCOUNTER — Inpatient Hospital Stay
Admission: EM | Admit: 2016-10-22 | Discharge: 2016-10-24 | DRG: 603 | Disposition: A | Discharge status: Home / Self Care | Payer: Medicare Other | Attending: Internal Medicine | Admitting: Internal Medicine

## 2016-10-22 ENCOUNTER — Inpatient Hospital Stay: Payer: Medicare Other

## 2016-10-22 DIAGNOSIS — M5136 Other intervertebral disc degeneration, lumbar region: Secondary | ICD-10-CM | POA: Diagnosis present

## 2016-10-22 DIAGNOSIS — W2203XA Walked into furniture, initial encounter: Secondary | ICD-10-CM | POA: Diagnosis present

## 2016-10-22 DIAGNOSIS — R17 Unspecified jaundice: Secondary | ICD-10-CM

## 2016-10-22 DIAGNOSIS — Z6841 Body Mass Index (BMI) 40.0 and over, adult: Secondary | ICD-10-CM | POA: Diagnosis not present

## 2016-10-22 DIAGNOSIS — S80812A Abrasion, left lower leg, initial encounter: Secondary | ICD-10-CM | POA: Diagnosis present

## 2016-10-22 DIAGNOSIS — I481 Persistent atrial fibrillation: Secondary | ICD-10-CM | POA: Diagnosis present

## 2016-10-22 DIAGNOSIS — Z8269 Family history of other diseases of the musculoskeletal system and connective tissue: Secondary | ICD-10-CM

## 2016-10-22 DIAGNOSIS — Z7901 Long term (current) use of anticoagulants: Secondary | ICD-10-CM

## 2016-10-22 DIAGNOSIS — I110 Hypertensive heart disease with heart failure: Secondary | ICD-10-CM | POA: Diagnosis present

## 2016-10-22 DIAGNOSIS — Z950 Presence of cardiac pacemaker: Secondary | ICD-10-CM | POA: Diagnosis not present

## 2016-10-22 DIAGNOSIS — K760 Fatty (change of) liver, not elsewhere classified: Secondary | ICD-10-CM | POA: Diagnosis not present

## 2016-10-22 DIAGNOSIS — L03116 Cellulitis of left lower limb: Principal | ICD-10-CM | POA: Diagnosis present

## 2016-10-22 DIAGNOSIS — Z7984 Long term (current) use of oral hypoglycemic drugs: Secondary | ICD-10-CM

## 2016-10-22 DIAGNOSIS — L0390 Cellulitis, unspecified: Secondary | ICD-10-CM | POA: Diagnosis present

## 2016-10-22 DIAGNOSIS — Z820 Family history of epilepsy and other diseases of the nervous system: Secondary | ICD-10-CM

## 2016-10-22 DIAGNOSIS — E7800 Pure hypercholesterolemia, unspecified: Secondary | ICD-10-CM | POA: Diagnosis present

## 2016-10-22 DIAGNOSIS — E119 Type 2 diabetes mellitus without complications: Secondary | ICD-10-CM | POA: Diagnosis not present

## 2016-10-22 DIAGNOSIS — I10 Essential (primary) hypertension: Secondary | ICD-10-CM | POA: Diagnosis not present

## 2016-10-22 DIAGNOSIS — Z8249 Family history of ischemic heart disease and other diseases of the circulatory system: Secondary | ICD-10-CM

## 2016-10-22 DIAGNOSIS — L570 Actinic keratosis: Secondary | ICD-10-CM | POA: Diagnosis present

## 2016-10-22 DIAGNOSIS — I6523 Occlusion and stenosis of bilateral carotid arteries: Secondary | ICD-10-CM | POA: Diagnosis present

## 2016-10-22 DIAGNOSIS — Z961 Presence of intraocular lens: Secondary | ICD-10-CM

## 2016-10-22 DIAGNOSIS — Z9049 Acquired absence of other specified parts of digestive tract: Secondary | ICD-10-CM

## 2016-10-22 DIAGNOSIS — Z79899 Other long term (current) drug therapy: Secondary | ICD-10-CM | POA: Diagnosis not present

## 2016-10-22 DIAGNOSIS — K219 Gastro-esophageal reflux disease without esophagitis: Secondary | ICD-10-CM | POA: Diagnosis present

## 2016-10-22 DIAGNOSIS — I872 Venous insufficiency (chronic) (peripheral): Secondary | ICD-10-CM | POA: Diagnosis present

## 2016-10-22 DIAGNOSIS — I4891 Unspecified atrial fibrillation: Secondary | ICD-10-CM | POA: Diagnosis not present

## 2016-10-22 DIAGNOSIS — Z87442 Personal history of urinary calculi: Secondary | ICD-10-CM

## 2016-10-22 DIAGNOSIS — I5032 Chronic diastolic (congestive) heart failure: Secondary | ICD-10-CM | POA: Diagnosis present

## 2016-10-22 DIAGNOSIS — R609 Edema, unspecified: Secondary | ICD-10-CM | POA: Diagnosis not present

## 2016-10-22 DIAGNOSIS — Z882 Allergy status to sulfonamides status: Secondary | ICD-10-CM | POA: Diagnosis not present

## 2016-10-22 DIAGNOSIS — Z79891 Long term (current) use of opiate analgesic: Secondary | ICD-10-CM | POA: Diagnosis not present

## 2016-10-22 DIAGNOSIS — Z7982 Long term (current) use of aspirin: Secondary | ICD-10-CM

## 2016-10-22 DIAGNOSIS — M7989 Other specified soft tissue disorders: Secondary | ICD-10-CM | POA: Diagnosis not present

## 2016-10-22 DIAGNOSIS — Z8673 Personal history of transient ischemic attack (TIA), and cerebral infarction without residual deficits: Secondary | ICD-10-CM

## 2016-10-22 DIAGNOSIS — E1151 Type 2 diabetes mellitus with diabetic peripheral angiopathy without gangrene: Secondary | ICD-10-CM | POA: Diagnosis present

## 2016-10-22 DIAGNOSIS — Z9841 Cataract extraction status, right eye: Secondary | ICD-10-CM

## 2016-10-22 LAB — CBC
HCT: 43.1 % (ref 40.0–52.0)
Hemoglobin: 15.1 g/dL (ref 13.0–18.0)
MCH: 33.8 pg (ref 26.0–34.0)
MCHC: 35 g/dL (ref 32.0–36.0)
MCV: 96.7 fL (ref 80.0–100.0)
Platelets: 183 10*3/uL (ref 150–440)
RBC: 4.46 MIL/uL (ref 4.40–5.90)
RDW: 13.6 % (ref 11.5–14.5)
WBC: 9.3 10*3/uL (ref 3.8–10.6)

## 2016-10-22 LAB — COMPREHENSIVE METABOLIC PANEL
ALT: 28 U/L (ref 17–63)
AST: 46 U/L — ABNORMAL HIGH (ref 15–41)
Albumin: 3.3 g/dL — ABNORMAL LOW (ref 3.5–5.0)
Alkaline Phosphatase: 96 U/L (ref 38–126)
Anion gap: 10 (ref 5–15)
BUN: 9 mg/dL (ref 6–20)
CO2: 25 mmol/L (ref 22–32)
Calcium: 9.1 mg/dL (ref 8.9–10.3)
Chloride: 101 mmol/L (ref 101–111)
Creatinine, Ser: 0.96 mg/dL (ref 0.61–1.24)
GFR calc Af Amer: >60 mL/min (ref >60)
GFR calc non Af Amer: >60 mL/min (ref >60)
Glucose, Bld: 298 mg/dL — ABNORMAL HIGH (ref 65–99)
Potassium: 4.4 mmol/L (ref 3.5–5.1)
Sodium: 136 mmol/L (ref 135–145)
Total Bilirubin: 1.9 mg/dL — ABNORMAL HIGH (ref 0.3–1.2)
Total Protein: 7.1 g/dL (ref 6.5–8.1)

## 2016-10-22 LAB — GLUCOSE, CAPILLARY
Glucose-Capillary: 204 mg/dL — ABNORMAL HIGH (ref 65–99)
Glucose-Capillary: 277 mg/dL — ABNORMAL HIGH (ref 65–99)

## 2016-10-22 LAB — PROTIME-INR
INR: 2.63
Prothrombin Time: 27.9 seconds — ABNORMAL HIGH (ref 11.4–15.2)

## 2016-10-22 MED ORDER — TRAMADOL-ACETAMINOPHEN 37.5-325 MG PO TABS
2.0000 | ORAL_TABLET | Freq: Two times a day (BID) | ORAL | Status: DC
Start: 2016-10-22 — End: 2016-10-24
  Administered 2016-10-22 – 2016-10-24 (×5): 2 via ORAL
  Filled 2016-10-22 (×5): qty 2

## 2016-10-22 MED ORDER — METFORMIN HCL 850 MG PO TABS
850.0000 mg | ORAL_TABLET | Freq: Two times a day (BID) | ORAL | Status: DC
  Administered 2016-10-22 – 2016-10-24 (×4): 850 mg via ORAL
  Filled 2016-10-22 (×5): qty 1

## 2016-10-22 MED ORDER — DILTIAZEM HCL 30 MG PO TABS
60.0000 mg | ORAL_TABLET | Freq: Three times a day (TID) | ORAL | Status: DC
  Administered 2016-10-22 – 2016-10-24 (×6): 60 mg via ORAL
  Filled 2016-10-22 (×7): qty 2

## 2016-10-22 MED ORDER — VANCOMYCIN HCL 10 G IV SOLR
1500.0000 mg | Freq: Two times a day (BID) | INTRAVENOUS | Status: DC
  Administered 2016-10-22 – 2016-10-24 (×4): 1500 mg via INTRAVENOUS
  Filled 2016-10-22 (×5): qty 1500

## 2016-10-22 MED ORDER — PIPERACILLIN-TAZOBACTAM 3.375 G IVPB
3.3750 g | Freq: Three times a day (TID) | INTRAVENOUS | Status: DC
  Administered 2016-10-22 – 2016-10-24 (×6): 3.375 g via INTRAVENOUS
  Filled 2016-10-22 (×6): qty 50

## 2016-10-22 MED ORDER — PNEUMOCOCCAL VAC POLYVALENT 25 MCG/0.5ML IJ INJ: 0.5000 mL | INJECTION | INTRAMUSCULAR | Status: DC

## 2016-10-22 MED ORDER — INSULIN ASPART 100 UNIT/ML ~~LOC~~ SOLN
0.0000 [IU] | Freq: Three times a day (TID) | SUBCUTANEOUS | Status: DC
  Administered 2016-10-22: 3 [IU] via SUBCUTANEOUS
  Administered 2016-10-23: 2 [IU] via SUBCUTANEOUS
  Administered 2016-10-23 (×2): 5 [IU] via SUBCUTANEOUS
  Administered 2016-10-24: 1 [IU] via SUBCUTANEOUS
  Administered 2016-10-24: 5 [IU] via SUBCUTANEOUS
  Filled 2016-10-22 (×5): qty 1

## 2016-10-22 MED ORDER — INSULIN ASPART 100 UNIT/ML ~~LOC~~ SOLN
0.0000 [IU] | Freq: Every day | SUBCUTANEOUS | Status: DC
  Administered 2016-10-22 – 2016-10-23 (×2): 3 [IU] via SUBCUTANEOUS
  Filled 2016-10-22 (×2): qty 1

## 2016-10-22 MED ORDER — VANCOMYCIN HCL IN DEXTROSE 1-5 GM/200ML-% IV SOLN
1000.0000 mg | Freq: Once | INTRAVENOUS | Status: AC
  Administered 2016-10-22: 1000 mg via INTRAVENOUS
  Filled 2016-10-22: qty 200

## 2016-10-22 MED ORDER — METOPROLOL TARTRATE 50 MG PO TABS
250.0000 mg | ORAL_TABLET | Freq: Every day | ORAL | Status: DC
  Filled 2016-10-22: qty 5

## 2016-10-22 MED ORDER — ONDANSETRON HCL 4 MG/2ML IJ SOLN: 4.0000 mg | Freq: Four times a day (QID) | INTRAMUSCULAR | Status: DC | PRN

## 2016-10-22 MED ORDER — PIPERACILLIN-TAZOBACTAM 3.375 G IVPB 30 MIN
3.3750 g | Freq: Once | INTRAVENOUS | Status: AC
  Administered 2016-10-22: 3.375 g via INTRAVENOUS
  Filled 2016-10-22: qty 50

## 2016-10-22 MED ORDER — METOPROLOL TARTRATE 50 MG PO TABS
200.0000 mg | ORAL_TABLET | Freq: Every day | ORAL | Status: DC
  Administered 2016-10-22: 200 mg via ORAL
  Filled 2016-10-22: qty 4

## 2016-10-22 MED ORDER — ACETAMINOPHEN 650 MG RE SUPP: 650.0000 mg | Freq: Four times a day (QID) | RECTAL | Status: DC | PRN

## 2016-10-22 MED ORDER — ONDANSETRON HCL 4 MG PO TABS: 4.0000 mg | ORAL_TABLET | Freq: Four times a day (QID) | ORAL | Status: DC | PRN

## 2016-10-22 MED ORDER — PIPERACILLIN-TAZOBACTAM 3.375 G IVPB 30 MIN
INTRAVENOUS | Status: AC
  Filled 2016-10-22: qty 50

## 2016-10-22 MED ORDER — SODIUM CHLORIDE 0.9 % IV SOLN
1250.0000 mg | Freq: Once | INTRAVENOUS | Status: DC
  Administered 2016-10-22: 1250 mg via INTRAVENOUS
  Filled 2016-10-22: qty 1250

## 2016-10-22 MED ORDER — ACETAMINOPHEN 325 MG PO TABS: 650.0000 mg | ORAL_TABLET | Freq: Four times a day (QID) | ORAL | Status: DC | PRN

## 2016-10-22 MED ORDER — WARFARIN - PHARMACIST DOSING INPATIENT: Freq: Every day | Status: DC

## 2016-10-22 MED ORDER — PANTOPRAZOLE SODIUM 40 MG PO TBEC
40.0000 mg | DELAYED_RELEASE_TABLET | Freq: Every day | ORAL | Status: DC
  Administered 2016-10-22 – 2016-10-24 (×3): 40 mg via ORAL
  Filled 2016-10-22 (×3): qty 1

## 2016-10-22 MED ORDER — WARFARIN SODIUM 7.5 MG PO TABS
7.5000 mg | ORAL_TABLET | Freq: Every day | ORAL | Status: DC
  Administered 2016-10-23: 7.5 mg via ORAL
  Filled 2016-10-22 (×2): qty 1

## 2016-10-22 MED ORDER — METOPROLOL TARTRATE 50 MG PO TABS: 200.0000 mg | ORAL_TABLET | Freq: Two times a day (BID) | ORAL | Status: DC

## 2016-10-22 MED ORDER — VANCOMYCIN HCL 10 G IV SOLR
1500.0000 mg | Freq: Two times a day (BID) | INTRAVENOUS | Status: DC
  Filled 2016-10-22: qty 1500

## 2016-10-22 MED ORDER — LISINOPRIL 20 MG PO TABS
20.0000 mg | ORAL_TABLET | Freq: Every day | ORAL | Status: DC
  Administered 2016-10-24: 20 mg via ORAL
  Filled 2016-10-22 (×2): qty 1

## 2016-10-22 MED ORDER — GLIPIZIDE 10 MG PO TABS
10.0000 mg | ORAL_TABLET | Freq: Two times a day (BID) | ORAL | Status: DC
  Administered 2016-10-22 – 2016-10-23 (×2): 10 mg via ORAL
  Filled 2016-10-22 (×2): qty 1

## 2016-10-22 MED ORDER — ASPIRIN EC 81 MG PO TBEC
81.0000 mg | DELAYED_RELEASE_TABLET | Freq: Every evening | ORAL | Status: DC
  Administered 2016-10-22 – 2016-10-23 (×2): 81 mg via ORAL
  Filled 2016-10-22 (×2): qty 1

## 2016-10-22 NOTE — Progress Notes (Signed)
Pharmacy Antibiotic Note  Christian Berg is a 66 y.o. male admitted on 10/22/2016 with cellulitis.  Pharmacy has been consulted for vancomycin and Zosyn dosing.  Plan: Vancomycin 1g IV x 1 in the ED. Will continue vancomycin 1512m IV Q12hr for goal trough of 10-15. Will obtain trough prior to am dose on 9/28.    Zosyn 3.375g IV x 1 in the ED. Will continue Zosyn EI 3.375g IV Q8hr.   Height: 5' 8"  (172.7 cm) Weight: (!) 301 lb (136.5 kg) IBW/kg (Calculated) : 68.4  ABW: 96kg  Temp (24hrs), Avg:98.7 F (37.1 C), Min:98.7 F (37.1 C), Max:98.7 F (37.1 C)   Recent Labs Lab 10/22/16 0825  WBC 9.3  CREATININE 0.96    Estimated Creatinine Clearance: 101 mL/min (by C-G formula based on SCr of 0.96 mg/dL).    Allergies  Allergen Reactions  . Sulfa Antibiotics Hives    Antimicrobials this admission: Vancomycin 9/26 >>  Zosyn 9/26 >>   Dose adjustments this admission: N/A  Microbiology results: N/A  Thank you for allowing pharmacy to be a part of this patient's care.  Simpson,Michael L 10/22/2016 12:02 PM

## 2016-10-22 NOTE — Progress Notes (Signed)
Patient admitted via the ED with Cellulitis to left lower leg.  Antibiotics have been started.  Edges of cellulitis have been marked.  Telemetry started.

## 2016-10-22 NOTE — Progress Notes (Signed)
Patient declines compression hose at this time. He thinks it would be very painful on his legs.  SCDs on.

## 2016-10-22 NOTE — ED Provider Notes (Signed)
[name]  ____________________________________________   I have reviewed the triage vital signs and the nursing notes.   HISTORY  Chief Complaint Leg Swelling    HPI Christian Berg is a 66 y.o. male who presents today complaining of cellulitis to the left lower shortly. He does have chronic venous insufficiency there, patientstates he has had a small cut on that leg, abrasion, the last several weeks. He states that however overnight it suddenly became red and swollen and tender and hot all around the lower leg. This is not happened to him before that he can recall. Patient states to half weeks ago he was on antibiotics for "blood poisoning" because he felt weak and had a fever, this was Keflex, he is done with that medication. He has had no fevers with this event this time thus far. Denies any shortness of breath no history of PE or DVT that he knows of. Pleg is swollen and tender over its baseline with diffuse erythema which is nearly circumferential  nothing makes it better nothing makes it worse   Past Medical History:  Diagnosis Date  . A-fib ([place])   . CHF (congestive heart failure) ([place])   . Degenerative disc disease, lumbar    s/p injury  . Diabetes mellitus without complication ([place])   . Disseminated superficial actinic porokeratosis   . Dysrhythmia    A-FIB, palpatations  . Hypercholesteremia   . Hypertension   . Kidney stones   . Peripheral vascular disease ([place])    Carotid stenosis  . Presence of permanent cardiac pacemaker    Inactive  . TIA (transient ischemic attack) 2011   No deficits    Patient Active Problem List   Diagnosis Date Noted  . Hypoalbuminemia 10/22/2015  . NASH (nonalcoholic steatohepatitis) 10/22/2015  . Anemia 10/22/2015  . Closed fracture of proximal end of left humerus with delayed healing 10/17/2015  . Ischemic embolic stroke ([place]) 44/92/0100  . Peripheral vascular  disease ([place]) 12/15/2014  . Morbid (severe) obesity due to excess calories ([place]) 11/22/2014  . VFD (visual field defect) 11/22/2014  . History of TIA (transient ischemic attack) 10/31/2014  . Kidney stones 07/24/2014  . Other cirrhosis of liver ([place]) 07/24/2014  . DSAP (disseminated superficial actinic porokeratosis) 07/17/2014  . Asthma 05/31/2014  . Chronic diastolic heart failure ([place]) 05/31/2014  . Diabetes mellitus ([place]) 05/31/2014  . Hypercholesteremia 05/31/2014  . B12 deficiency 05/31/2014  . Carotid artery narrowing 02/15/2014  . Bilateral carotid artery stenosis 02/15/2014  . Chronic atrial fibrillation ([place]) 10/24/2013  . Benign prostatic hyperplasia with urinary obstruction 09/06/2012  . Heart disease 05/29/2009  . Benign essential HTN 09/29/2008  . Narrowing of intervertebral disc space 01/11/2007    Past Surgical History:  Procedure Laterality Date  . APPENDECTOMY  2004  . CARDIAC CATHETERIZATION    . CARDIAC PACEMAKER PLACEMENT  2005  . CATARACT EXTRACTION W/PHACO Right 06/14/2014   Procedure: CATARACT EXTRACTION PHACO AND INTRAOCULAR LENS PLACEMENT (IOC);  Surgeon: Leandrew Koyanagi, MD;  Location: [place];  Service: Ophthalmology;  Laterality: Right;  . CYSTOSCOPY W/ RETROGRADES Bilateral 08/09/2014   Procedure: CYSTOSCOPY WITH RETROGRADE PYELOGRAM;  Surgeon: Hollice Espy, MD;  Location: [facility];  Service: Urology;  Laterality: Bilateral;    Prior to Admission medications   Medication Sig Start Date End Date Taking? Authorizing Provider  aspirin EC 81 MG tablet Take 81 mg by mouth at bedtime.     [provider]  cephALEXin [facility])  500 MG capsule Take 1 capsule (500 mg total) by mouth 4 (four) times daily. 10/06/16   [name], PA  diltiazem (CARDIZEM) 60 MG tablet Take 1 tablet (60 mg total) by mouth every 8 (eight) hours. 09/19/15   Lisette Abu, PA-C  fluorouracil (EFUDEX) 5 % cream Apply 1 application topically 2 (two) times  daily. As needed for DSAP 04/13/15   [name], PA  glipiZIDE (GLUCOTROL) 10 MG tablet Take 1 tablet (10 mg total) by mouth 2 (two) times daily before a meal. Take 30 minutes before a meal 10/06/16   [name], PA  lansoprazole (PREVACID) 15 MG capsule Take 15 mg by mouth daily at 12 noon. 1-2 pills daily as needed for heartburn/reflux    [provider]  lisinopril (PRINIVIL,ZESTRIL) 20 MG tablet Take 1 tablet (20 mg total) by mouth daily. 10/16/15   [name], PA  lovastatin (MEVACOR) 10 MG tablet Take 2 tablets (20 mg total) by mouth daily. 08/08/16   [name], PA  metFORMIN (GLUCOPHAGE) 850 MG tablet TAKE ONE TABLET BY MOUTH TWICE DAILY WITH MEALS 07/22/16   [name], PA  metoprolol (LOPRESSOR) 100 MG tablet TAKE ONE TABLET BY MOUTH TWICE DAILY Patient taking differently: TAKE ONE TABLET BY MOUTH TWICE DAILY 250 mg in the AM and 200 mg PM changed by cardiology 12/26/15   [name], PA  nystatin cream (MYCOSTATIN) Apply 1 application topically 2 (two) times daily. Patient taking differently: Apply 1 application topically 2 (two) times daily as needed.  07/17/14   Zara Council A, PA-C  traMADol (ULTRAM) 50 MG tablet Take 50 mg by mouth 2 (two) times daily.    [provider]  warfarin (COUMADIN) 7.5 MG tablet Take 1 tablet (7.5 mg total) by mouth daily. 08/08/16   [name], PA    Allergies Sulfa antibiotics  Family History  Problem Relation Age of Onset  . Adopted: Yes  . Heart disease Mother   . Fibromyalgia Sister   . Hypertension Daughter   . Migraines Daughter   . Kidney disease Neg Hx   . Prostate cancer Neg Hx   . Bladder Cancer Neg Hx     Social History Social History  Substance Use Topics  . Smoking status: Never Smoker  . Smokeless tobacco: Never Used  . Alcohol use No    Review of Systems Constitutional: No fever/chills Eyes: No visual changes. ENT: No sore throat. No stiff neck no neck  pain Cardiovascular: Denies chest pain. Respiratory: Denies shortness of breath. Gastrointestinal:   no vomiting.  No diarrhea.  No constipation. Genitourinary: Negative for dysuria. Musculoskeletal: positivelower extremity swelling Skin: positivefor rash. Neurological: Negative for severe headaches, focal weakness or numbness.   ____________________________________________   PHYSICAL EXAM:  VITAL SIGNS: ED Triage Vitals [10/22/16 0813]  Enc Vitals Group     BP 129/79     Pulse Rate (!) 105     Resp 16     Temp 98.7 F (37.1 C)     Temp Source Oral     SpO2 98 %     Weight (!) 301 lb (136.5 kg)     Height 5' 8"  (1.727 m)     Head Circumference      Peak Flow      Pain Score 8     Pain Loc      Pain Edu?      Excl. in [place]?     Constitutional: Alert and oriented. Well appearing and in no  acute distress. Eyes: Conjunctivae are normal Head: Atraumatic HEENT: No congestion/rhinnorhea. Mucous membranes are moist.  Oropharynx non-erythematous Neck:   Nontender with no meningismus, no masses, no stridor Cardiovascular: Normal rate, regular rhythm. Grossly normal heart sounds.  Good peripheral circulation. Respiratory: Normal respiratory effort.  No retractions. Lungs CTAB. Abdominal: Soft and nontender. No distention. No guarding no rebound Back:  There is no focal tenderness or step off.  there is no midline tenderness there are no lesions noted. there is no CVA tenderness Musculoskeletal: No lower extremity tenderness, no upper extremity tenderness. No joint effusions, no DVT signs patient with diffuse cellulitic changes see below, skin,  Neurologic:  Normal speech and language. No gross focal neurologic deficits are appreciated.  Skin:  Skin is warm, dry and intact. there is a old abrasion noted to the left anterior shin and there is glabrous  skin changes in the left shin, and there is diffuse erythema and warmth noted to the lower leg that goes around towards the calf. It  takes up most of the pretibial region. Feet seem to be spared. There is tenderness to palpation. There is no crepitus, I don't think the tenderness is out of proportion to exam, nothing to suggest necrotizing fasciitis or gas gangrene   Psychiatric: Mood and affect are normal. Speech and behavior are normal.  ____________________________________________   LABS (all labs ordered are listed, but only abnormal results are displayed)  Labs Reviewed  COMPREHENSIVE METABOLIC PANEL - Abnormal; Notable for the following:       Result Value   Glucose, Bld 298 (*)    Albumin 3.3 (*)    AST 46 (*)    Total Bilirubin 1.9 (*)    All other components within normal limits  CULTURE, BLOOD (ROUTINE X 2)  CULTURE, BLOOD (ROUTINE X 2)  CBC    Pertinent labs  results that were available during my care of the patient were reviewed by me and considered in my medical decision making (see chart for details). ____________________________________________  EKG  I personally interpreted any EKGs ordered by me or triage  ____________________________________________  RADIOLOGY  Pertinent labs & imaging results that were available during my care of the patient were reviewed by me and considered in my medical decision making (see chart for details). If possible, patient and/or family made aware of any abnormal findings. ____________________________________________    PROCEDURES  Procedure(s) performed: None  Procedures  Critical Care performed: None  ____________________________________________   INITIAL IMPRESSION / ASSESSMENT AND PLAN / ED COURSE  Pertinent labs & imaging results that were available during my care of the patient were reviewed by me and considered in my medical decision making (see chart for details).  patient with a very rapid cellulitis, has chronic venous congestion and there is an abrasion to that area was certainly could've lead to infection. Differential includes DVT,  has a fasciitis mother which I think are present clinically. However, patient was recently on antibiotics for unknown infection, I will give him aggressive antibiotics for what appears to be rapidly progressing cellulitis and we will admit.    ____________________________________________   FINAL CLINICAL IMPRESSION(S) / ED DIAGNOSES  Final diagnoses:  Cellulitis of left lower extremity      This chart was dictated using voice recognition software.  Despite best efforts to proofread,  errors can occur which can change meaning.      Schuyler Amor, MD 10/22/16 1018

## 2016-10-22 NOTE — Progress Notes (Addendum)
ANTICOAGULATION CONSULT NOTE - Initial Consult  Pharmacy Consult for warfarin Indication: NVAF  Allergies  Allergen Reactions  . Sulfa Antibiotics Hives    Patient Measurements: Height: 5' 8"  (172.7 cm) Weight: (!) 301 lb (136.5 kg) IBW/kg (Calculated) : 68.4 Heparin Dosing Weight:   Vital Signs: Temp: 98.7 F (37.1 C) (09/26 1409) Temp Source: Oral (09/26 1409) BP: 151/81 (09/26 1409) Pulse Rate: 95 (09/26 1409)  Labs:  Recent Labs  10/22/16 0825  HGB 15.1  HCT 43.1  PLT 183  CREATININE 0.96    Estimated Creatinine Clearance: 101 mL/min (by C-G formula based on SCr of 0.96 mg/dL).   Medical History: Past Medical History:  Diagnosis Date  . A-fib ([place])   . CHF (congestive heart failure) ([place])   . Degenerative disc disease, lumbar    s/p injury  . Diabetes mellitus without complication ([place])   . Disseminated superficial actinic porokeratosis   . Dysrhythmia    A-FIB, palpatations  . Hypercholesteremia   . Hypertension   . Kidney stones   . Peripheral vascular disease ([place])    Carotid stenosis  . Presence of permanent cardiac pacemaker    Inactive  . TIA (transient ischemic attack) 2011   No deficits    Medications:  Infusions:  . piperacillin-tazobactam (ZOSYN)  IV    . vancomycin      Assessment: 65 yom cc leg swelling with PMH AF, CHF, DDD, DM, HTN, disseminated superficial actinic photokeratosis. Recently started on Keflex for malasie/leg weakness/erythema of upper back.   DATE INR DOSE 9/26 2.63 7.5 mg (taken at home before admissions  Goal of Therapy:  INR 2-3 Monitor platelets by anticoagulation protocol: Yes   Plan:  Baseline INR 2.63. Will continue home dose of warfarin 7.5 mg po daily and follow INR daily while on antibiotics (now on Vanc/Zosyn for cellulitis).  Start dosing on 9/27 - patient reports taking dose 9/26 before admission.   Laural Benes, Pharm.D., BCPS Clinical Pharmacist 10/22/2016,2:47 PM

## 2016-10-22 NOTE — ED Notes (Signed)
Called pharmacy for zosyn.

## 2016-10-22 NOTE — H&P (Signed)
[place] [facility] [place]: Artez Regis    MR#:  [id number]  DATE OF BIRTH:  1950-05-14  DATE OF ADMISSION:  10/22/2016  PRIMARY CARE PHYSICIAN: [name], PA   REQUESTING/REFERRING PHYSICIAN: Dr. Charlotte Crumb  CHIEF COMPLAINT:   Chief Complaint  Patient presents with  . Leg Swelling    HISTORY OF PRESENT ILLNESS:  Marcanthony Sleight  is a 66 y.o. male with a known history of persistent afib on warfarin, congestive heart failure, degenerative disc disease, diabetes mellitus, hypertension, disseminated superficial actinic photo keratosis presents to hospital secondary to worsening swelling, redness of his left leg. Patient had malaise, leg weakness and erythema on his upper back last week for which he was started on Keflex. Yesterday he noticed that his left leg was swollen, and red and warm to touch.The swelling did not seem to improved today. Denies any travel. No sick contacts. 2 weeks ago he bumped into a table and had an abrasion on the same leg which is healing at this time. No fevers, but had chills yesterday. No nausea or vomiting. No other complaints. No tick bites  PAST MEDICAL HISTORY:   Past Medical History:  Diagnosis Date  . A-fib ([place])   . CHF (congestive heart failure) ([place])   . Degenerative disc disease, lumbar    s/p injury  . Diabetes mellitus without complication ([place])   . Disseminated superficial actinic porokeratosis   . Dysrhythmia    A-FIB, palpatations  . Hypercholesteremia   . Hypertension   . Kidney stones   . Peripheral vascular disease ([place])    Carotid stenosis  . Presence of permanent cardiac pacemaker    Inactive  . TIA (transient ischemic attack) 2011   No deficits    PAST SURGICAL HISTORY:   Past Surgical History:  Procedure Laterality Date  . APPENDECTOMY  2004  . CARDIAC CATHETERIZATION    . CARDIAC PACEMAKER PLACEMENT  2005  . CATARACT EXTRACTION W/PHACO Right 06/14/2014   Procedure: CATARACT EXTRACTION PHACO AND INTRAOCULAR LENS PLACEMENT (IOC);  Surgeon: Leandrew Koyanagi, MD;  Location: [place];  Service: Ophthalmology;  Laterality: Right;  . CYSTOSCOPY W/ RETROGRADES Bilateral 08/09/2014   Procedure: CYSTOSCOPY WITH RETROGRADE PYELOGRAM;  Surgeon: Hollice Espy, MD;  Location: [facility];  Service: Urology;  Laterality: Bilateral;    SOCIAL HISTORY:   Social History  Substance Use Topics  . Smoking status: Never Smoker  . Smokeless tobacco: Never Used  . Alcohol use No    FAMILY HISTORY:   Family History  Problem Relation Age of Onset  . Adopted: Yes  . Heart disease Mother   . Fibromyalgia Sister   . Hypertension Daughter   . Migraines Daughter   . Kidney disease Neg Hx   . Prostate cancer Neg Hx   . Bladder Cancer Neg Hx     DRUG ALLERGIES:   Allergies  Allergen Reactions  . Sulfa Antibiotics Hives    REVIEW OF SYSTEMS:   Review of Systems  Constitutional: Positive for malaise/fatigue. Negative for chills, fever and weight loss.  HENT: Negative for ear discharge, ear pain, hearing loss, nosebleeds and tinnitus.   Eyes: Negative for blurred vision, double vision and photophobia.  Respiratory: Negative for cough, hemoptysis, shortness of breath and wheezing.   Cardiovascular: Positive for leg swelling. Negative for chest pain, palpitations and orthopnea.  Gastrointestinal: Negative for abdominal pain, constipation, diarrhea, heartburn, melena, nausea and vomiting.  Genitourinary: Negative for dysuria, frequency, hematuria and  urgency.  Musculoskeletal: Negative for back pain, myalgias and neck pain.  Skin: Negative for rash.  Neurological: Negative for dizziness, tingling, sensory change, speech change, focal weakness and headaches.  Endo/Heme/Allergies: Does not bruise/bleed easily.  Psychiatric/Behavioral: Negative for depression.    MEDICATIONS AT HOME:   Prior to Admission medications   Medication Sig Start  Date End Date Taking? Authorizing Provider  aspirin EC 81 MG tablet Take 81 mg by mouth every evening.    Yes [provider]  cephALEXin (KEFLEX) 500 MG capsule Take 1 capsule (500 mg total) by mouth 4 (four) times daily. 10/06/16  Yes [name], PA  diltiazem (CARDIZEM) 60 MG tablet Take 1 tablet (60 mg total) by mouth every 8 (eight) hours. 09/19/15  Yes Lisette Abu, PA-C  glipiZIDE (GLUCOTROL) 10 MG tablet Take 1 tablet (10 mg total) by mouth 2 (two) times daily before a meal. Take 30 minutes before a meal 10/06/16  Yes [name], PA  lansoprazole (PREVACID) 15 MG capsule Take 15-30 mg by mouth daily as needed. For heartburn/reflux   Yes [provider]  lisinopril (PRINIVIL,ZESTRIL) 20 MG tablet Take 1 tablet (20 mg total) by mouth daily. 10/16/15  Yes [name], PA  lovastatin (MEVACOR) 10 MG tablet Take 2 tablets (20 mg total) by mouth daily. Patient taking differently: Take 20 mg by mouth at bedtime.  08/08/16  Yes [name], PA  metFORMIN (GLUCOPHAGE) 850 MG tablet Take 850 mg by mouth 2 (two) times daily with a meal.   Yes [provider]  metoprolol tartrate (LOPRESSOR) 100 MG tablet Take 200-250 mg by mouth 2 (two) times daily. 250 mg every morning and 200 mg at bedtime   Yes [provider]  nystatin cream (MYCOSTATIN) Apply 1 application topically 2 (two) times daily. Patient taking differently: Apply 1 application topically 2 (two) times daily as needed for dry skin.  07/17/14  Yes McGowan, Larene [place] A, PA-C  traMADol-acetaminophen (ULTRACET) 37.5-325 MG tablet Take 2 tablets by mouth 2 (two) times daily.   Yes [provider]  traMADol-acetaminophen (ULTRACET) 37.5-325 MG tablet Take 2 tablets by mouth as needed. Daily with lunch as needed for severe pain   Yes [provider]  warfarin (COUMADIN) 7.5 MG tablet Take 1 tablet (7.5 mg total) by mouth daily. 08/08/16  Yes [name], PA      VITAL SIGNS:    Blood pressure 129/79, pulse (!) 105, temperature 98.7 F (37.1 C), temperature source Oral, resp. rate 16, height 5' 8"  (1.727 m), weight (!) 136.5 kg (301 lb), SpO2 98 %.  PHYSICAL EXAMINATION:   Physical Exam  GENERAL:  66 y.o.-year-old Obese patient lying in the bed with no acute distress.  EYES: Pupils equal, round, reactive to light and accommodation. No scleral icterus. Extraocular muscles intact.  HEENT: Head atraumatic, normocephalic. Oropharynx and nasopharynx clear.  NECK:  Supple, no jugular venous distention. No thyroid enlargement, no tenderness.  LUNGS: Normal breath sounds bilaterally, no wheezing, rales,rhonchi or crepitation. No use of accessory muscles of respiration. Decreased bibasilar breath sounds CARDIOVASCULAR: S1, S2 normal. No murmurs, rubs, or gallops.  ABDOMEN: Soft, nontender, nondistended. Bowel sounds present. No organomegaly or mass.  EXTREMITIES: left leg is erythematous, swollen, warm from knee down. No cyanosis, or clubbing.  NEUROLOGIC: Cranial nerves II through XII are intact. Muscle strength 5/5 in all extremities. Sensation intact. Gait not checked.  PSYCHIATRIC: The patient is alert and oriented x 3.  SKIN: No obvious rash, lesion, or ulcer. Superficial  skin lesions on legs, arms from his photosensitive skin rash.  LABORATORY PANEL:   CBC  Recent Labs Lab 10/22/16 0825  WBC 9.3  HGB 15.1  HCT 43.1  PLT 183   ------------------------------------------------------------------------------------------------------------------  Chemistries   Recent Labs Lab 10/22/16 0825  NA 136  K 4.4  CL 101  CO2 25  GLUCOSE 298*  BUN 9  CREATININE 0.96  CALCIUM 9.1  AST 46*  ALT 28  ALKPHOS 96  BILITOT 1.9*   ------------------------------------------------------------------------------------------------------------------  Cardiac Enzymes No results for input(s): TROPONINI in the last 168  hours. ------------------------------------------------------------------------------------------------------------------  RADIOLOGY:  No results found.  EKG:   Orders placed or performed during the hospital encounter of 09/15/15  . EKG 12-Lead  . EKG 12-Lead  . ED EKG  . ED EKG  . EKG 12-Lead  . EKG 12-Lead  . EKG    IMPRESSION AND PLAN:   Alonte Wulff  is a 66 y.o. male with a known history of persistent afib on warfarin, congestive heart failure, degenerative disc disease, diabetes mellitus, hypertension, disseminated superficial actinic photo keratosis presents to hospital secondary to worsening swelling, redness of his left leg.  #1 left lower extremity cellulitis-followed after her trauma and abrasion to the leg. -Blood cultures are pending -Continue vancomycin and Zosyn. Patient was on Keflex as outpatient which will be held -Keep the leg elevated  #2 persistent A. Fib-on high doses of metoprolol and Cardizem. Will continue that -On warfarin for anticoagulation. Check INR  #3 diabetes mellitus-takes metformin and glipizide. Most recent A1c is 9.5 - on sliding scale insulin  #4 Hypertension-on lisinopril, metoprolol and Cardizem  #5 DVT prophylaxis-already on warfarin    All the records are reviewed and case discussed with ED provider. Management plans discussed with the patient, family and they are in agreement.  CODE STATUS: Full Code  TOTAL TIME TAKING CARE OF THIS PATIENT: 50 minutes.    Gladstone Lighter M.D on 10/22/2016 at 11:13 AM  Between 7am to 6pm - Pager - [phone]  After 6pm go to [email or web address] - password [email or web address] [place] [facility]  Office  [phone]  CC: Primary care physician; [name], [place]

## 2016-10-22 NOTE — ED Notes (Signed)
Patient transported to Ultrasound 

## 2016-10-22 NOTE — ED Triage Notes (Signed)
Spreading redness left anterior leg.  No fever.

## 2016-10-23 LAB — CBC
HCT: 44.3 % (ref 40.0–52.0)
Hemoglobin: 15.4 g/dL (ref 13.0–18.0)
MCH: 34.4 pg — ABNORMAL HIGH (ref 26.0–34.0)
MCHC: 34.7 g/dL (ref 32.0–36.0)
MCV: 99.4 fL (ref 80.0–100.0)
Platelets: 157 10*3/uL (ref 150–440)
RBC: 4.46 MIL/uL (ref 4.40–5.90)
RDW: 13.9 % (ref 11.5–14.5)
WBC: 8.3 10*3/uL (ref 3.8–10.6)

## 2016-10-23 LAB — GLUCOSE, CAPILLARY
Glucose-Capillary: 182 mg/dL — ABNORMAL HIGH (ref 65–99)
Glucose-Capillary: 263 mg/dL — ABNORMAL HIGH (ref 65–99)
Glucose-Capillary: 272 mg/dL — ABNORMAL HIGH (ref 65–99)
Glucose-Capillary: 279 mg/dL — ABNORMAL HIGH (ref 65–99)

## 2016-10-23 LAB — BASIC METABOLIC PANEL
Anion gap: 10 (ref 5–15)
BUN: 14 mg/dL (ref 6–20)
CO2: 26 mmol/L (ref 22–32)
Calcium: 9.2 mg/dL (ref 8.9–10.3)
Chloride: 103 mmol/L (ref 101–111)
Creatinine, Ser: 1.23 mg/dL (ref 0.61–1.24)
GFR calc Af Amer: >60 mL/min (ref >60)
GFR calc non Af Amer: 59 mL/min — ABNORMAL LOW (ref >60)
Glucose, Bld: 162 mg/dL — ABNORMAL HIGH (ref 65–99)
Potassium: 3.6 mmol/L (ref 3.5–5.1)
Sodium: 139 mmol/L (ref 135–145)

## 2016-10-23 LAB — PROTIME-INR
INR: 2.46
Prothrombin Time: 26.5 seconds — ABNORMAL HIGH (ref 11.4–15.2)

## 2016-10-23 MED ORDER — INSULIN GLARGINE 100 UNIT/ML ~~LOC~~ SOLN: 14.0000 [IU] | Freq: Every day | SUBCUTANEOUS | Status: DC

## 2016-10-23 MED ORDER — METOPROLOL TARTRATE 100 MG PO TABS
200.0000 mg | ORAL_TABLET | Freq: Every day | ORAL | Status: DC
  Administered 2016-10-23: 200 mg via ORAL
  Filled 2016-10-23 (×2): qty 4
  Filled 2016-10-23: qty 2

## 2016-10-23 MED ORDER — INSULIN GLARGINE 100 UNIT/ML ~~LOC~~ SOLN
14.0000 [IU] | Freq: Every day | SUBCUTANEOUS | Status: DC
  Administered 2016-10-23: 14 [IU] via SUBCUTANEOUS
  Filled 2016-10-23 (×2): qty 0.14

## 2016-10-23 MED ORDER — METOPROLOL TARTRATE 50 MG PO TABS
250.0000 mg | ORAL_TABLET | Freq: Every day | ORAL | Status: DC
  Filled 2016-10-23: qty 1

## 2016-10-23 NOTE — Progress Notes (Signed)
Pt BP 111/54, MD Bridgett Larsson notified. Orders received to hold Lisinopril and Metoprolol.

## 2016-10-23 NOTE — Progress Notes (Signed)
[place] [facility] [place] NAME: Christian Berg    MR#:  [id number]  DATE OF BIRTH:  1950-04-01  SUBJECTIVE:  CHIEF COMPLAINT:   Chief Complaint  Patient presents with  . Leg Swelling   Left leg swelling and pain. REVIEW OF SYSTEMS:  Review of Systems  Constitutional: Negative for chills, fever and malaise/fatigue.  HENT: Negative for sore throat.   Eyes: Negative for blurred vision and double vision.  Respiratory: Negative for cough, hemoptysis, shortness of breath, wheezing and stridor.   Cardiovascular: Positive for leg swelling. Negative for chest pain, palpitations and orthopnea.  Gastrointestinal: Negative for abdominal pain, blood in stool, diarrhea, melena, nausea and vomiting.  Genitourinary: Negative for dysuria, flank pain and hematuria.  Musculoskeletal: Negative for back pain and joint pain.       Left leg redness, swelling and pain.  Skin: Negative for rash.  Neurological: Negative for dizziness, sensory change, focal weakness, seizures, loss of consciousness, weakness and headaches.  Endo/Heme/Allergies: Negative for polydipsia.  Psychiatric/Behavioral: Negative for depression. The patient is not nervous/anxious.     DRUG ALLERGIES:   Allergies  Allergen Reactions  . Sulfa Antibiotics Hives   VITALS:  Blood pressure 131/68, pulse 94, temperature 98.2 F (36.8 C), temperature source Oral, resp. rate 18, height 5' 8"  (1.727 m), weight (!) 301 lb (136.5 kg), SpO2 98 %. PHYSICAL EXAMINATION:  Physical Exam  Constitutional: He is oriented to person, place, and time. No distress.  Morbid obese.  HENT:  Head: Normocephalic.  Mouth/Throat: Oropharynx is clear and moist.  Eyes: Pupils are equal, round, and reactive to light. Conjunctivae and EOM are normal. No scleral icterus.  Neck: Normal range of motion. Neck supple. No JVD present. No tracheal deviation present.  Cardiovascular: Normal rate, regular rhythm and normal  heart sounds.  Exam reveals no gallop.   No murmur heard. Pulmonary/Chest: Effort normal and breath sounds normal. No respiratory distress. He has no wheezes. He has no rales.  Abdominal: Soft. Bowel sounds are normal. He exhibits no distension. There is no tenderness. There is no rebound.  Musculoskeletal: Normal range of motion. He exhibits no edema or tenderness.  Left leg erythema, swelling and tenderness.  Neurological: He is alert and oriented to person, place, and time. No cranial nerve deficit.  Skin: No rash noted. No erythema.  Psychiatric: Affect normal.   LABORATORY PANEL:  Male CBC  Recent Labs Lab 10/23/16 0441  WBC 8.3  HGB 15.4  HCT 44.3  PLT 157   ------------------------------------------------------------------------------------------------------------------ Chemistries   Recent Labs Lab 10/22/16 0825 10/23/16 0441  NA 136 139  K 4.4 3.6  CL 101 103  CO2 25 26  GLUCOSE 298* 162*  BUN 9 14  CREATININE 0.96 1.23  CALCIUM 9.1 9.2  AST 46*  --   ALT 28  --   ALKPHOS 96  --   BILITOT 1.9*  --    RADIOLOGY:  No results found. ASSESSMENT AND PLAN:   Christian Berg  is a 66 y.o. male with a known history of persistent afib on warfarin, congestive heart failure, degenerative disc disease, diabetes mellitus, hypertension, disseminated superficial actinic photo keratosis presents to hospital secondary to worsening swelling, redness of his left leg.  #1 left lower extremity cellulitis-followed after her trauma and abrasion to the leg. -Blood cultures are pending. [place]: No evidence of DVT within the LEFT lower extremity. -Continue vancomycin and Zosyn. Patient was on Keflex as outpatient. -Keep the leg elevated  #  2 persistent A. Fib Continue metoprolol and Cardizem. -On warfarin, INR is therapeutic.  #3 diabetes mellitus Hold metformin and glipizide. Most recent A1c is 9.5 Start Lantus 40 units at at bedtime and continued sliding scale insulin  #4  Hypertension-on lisinopril, metoprolol and Cardizem  Elevated bilirubin. Unclear etiology, follow-up abdominal ultrasound and bilirubin. PVD. Continue Coumadin. Morbid obesity.  All the records are reviewed and case discussed with Care Management/Social Worker. Management plans discussed with the patient, his wife and they are in agreement.  CODE STATUS: Full Code  TOTAL TIME TAKING CARE OF THIS PATIENT: 36 minutes.   More than 50% of the time was spent in counseling/coordination of care: YES  POSSIBLE D/C IN 2 DAYS, DEPENDING ON CLINICAL CONDITION.   Demetrios Loll M.D on 10/23/2016 at 3:31 PM  Between 7am to 6pm - Pager - [phone]  After 6pm go to [email or web address] - [place] [facility]

## 2016-10-23 NOTE — Care Management (Addendum)
Met with patient to discuss discharge planning. He is from home with wife. States he is independent with mobility but has a walker, cane, and bedside commode at home. His wife drives. He denies need for home health. PCP with [facility].  

## 2016-10-23 NOTE — Progress Notes (Signed)
Inpatient Diabetes Program Recommendations  AACE/ADA: New Consensus Statement on Inpatient Glycemic Control (2015)  Target Ranges:  Prepandial:   less than 140 mg/dL      Peak postprandial:   less than 180 mg/dL (1-2 hours)      Critically ill patients:  140 - 180 mg/dL   Lab Results  Component Value Date   GLUCAP 182 (H) 10/23/2016   HGBA1C 9.5 10/06/2016    Review of Glycemic Control  Results for Christian Berg, Christian Berg (MRN [id number]) as of 10/23/2016 08:09  Ref. Range 10/22/2016 16:05 10/22/2016 21:15 10/23/2016 07:35  Glucose-Capillary Latest Ref Range: 65 - 99 mg/dL 204 (H) 277 (H) 182 (H)   Diabetes history: Type 2 Outpatient Diabetes medications: Glipizide 55m bid, Glucophage 8574mbid Current orders for Inpatient glycemic control: Glipizide 1052mid, Glucophage 850m108md, Novolog 0-9 units tid, Novolog 0-5 units qhs  Inpatient Diabetes Program Recommendations:  Please d/c Glipizide while patient is inpatient. Consider adding Lantus 14 units qhs (0.1unit/kg)  JuliGentry Fitz, BA, [place] Diabetes Coordinator Inpatient Diabetes Program  [phone] Pager) [phone]/2018 8:12 AM

## 2016-10-23 NOTE — Progress Notes (Signed)
ANTICOAGULATION CONSULT NOTE - Initial Consult  Pharmacy Consult for warfarin Indication: NVAF  Allergies  Allergen Reactions  . Sulfa Antibiotics Hives   Patient Measurements: Height: 5' 8"  (172.7 cm) Weight: (!) 301 lb (136.5 kg) IBW/kg (Calculated) : 68.4  Vital Signs: Temp: 98.9 F (37.2 C) (09/27 0737) Temp Source: Oral (09/27 0737) BP: 122/53 (09/27 0737) Pulse Rate: 73 (09/27 0737)  Labs:  Recent Labs  10/22/16 0825 10/22/16 1348 10/23/16 0441  HGB 15.1  --  15.4  HCT 43.1  --  44.3  PLT 183  --  157  LABPROT  --  27.9* 26.5*  INR  --  2.63 2.46  CREATININE 0.96  --  1.23    Assessment: Pharmacy consulted to dose and monitor warfarin in this 66 year old male admitted with cellulitis who was taking warfarin PTA for atrial fibrillation. Patient was recently prescribed cephalexin PTA and is currently on vancomycin and piperacillin/tazobactam.  Home regimen: warfarin 7.5 mg PO daily INR = 2.63 was therapeutic on admission  Dosing history DATE INR DOSE 9/26 2.63 7.5 mg (taken at home before admission) 9/27 2.46 7.5 mg  Goal of Therapy:  INR 2-3 Monitor platelets by anticoagulation protocol: Yes   Plan:  INR is therapeutic. Will continue home dose of warfarin 7.5 mg PO daily and follow INR daily while on antibiotics.  Lenis Noon, Pharm.D., BCPS Clinical Pharmacist 10/23/2016,7:38 AM

## 2016-10-24 ENCOUNTER — Inpatient Hospital Stay: Payer: Medicare Other

## 2016-10-24 LAB — GLUCOSE, CAPILLARY
Glucose-Capillary: 144 mg/dL — ABNORMAL HIGH (ref 65–99)
Glucose-Capillary: 279 mg/dL — ABNORMAL HIGH (ref 65–99)

## 2016-10-24 LAB — PROTIME-INR
INR: 2.26
Prothrombin Time: 24.8 seconds — ABNORMAL HIGH (ref 11.4–15.2)

## 2016-10-24 LAB — BILIRUBIN, TOTAL: Total Bilirubin: 1.8 mg/dL — ABNORMAL HIGH (ref 0.3–1.2)

## 2016-10-24 MED ORDER — METOPROLOL TARTRATE 50 MG PO TABS
150.0000 mg | ORAL_TABLET | Freq: Every day | ORAL | Status: DC
  Administered 2016-10-24: 150 mg via ORAL

## 2016-10-24 MED ORDER — METOPROLOL TARTRATE 50 MG PO TABS: 100.0000 mg | ORAL_TABLET | Freq: Every day | ORAL | Status: DC

## 2016-10-24 MED ORDER — METOPROLOL TARTRATE 75 MG PO TABS: 150.0000 mg | ORAL_TABLET | Freq: Every day | ORAL | Status: DC

## 2016-10-24 MED ORDER — METOPROLOL TARTRATE 100 MG PO TABS: 100.0000 mg | ORAL_TABLET | Freq: Every day | ORAL | Status: DC

## 2016-10-24 MED ORDER — CLINDAMYCIN HCL 300 MG PO CAPS: 600.0000 mg | ORAL_CAPSULE | Freq: Three times a day (TID) | ORAL | 0 refills | Status: DC

## 2016-10-24 NOTE — Progress Notes (Signed)
ANTICOAGULATION CONSULT NOTE - Initial Consult  Pharmacy Consult for warfarin Indication: NVAF  Allergies  Allergen Reactions  . Sulfa Antibiotics Hives   Patient Measurements: Height: 5' 8"  (172.7 cm) Weight: (!) 301 lb (136.5 kg) IBW/kg (Calculated) : 68.4  Vital Signs: Temp: 99.5 F (37.5 C) (09/27 2002) Temp Source: Oral (09/27 2002) BP: 128/44 (09/27 2002) Pulse Rate: 71 (09/27 2002)  Labs:  Recent Labs  10/22/16 0825 10/22/16 1348 10/23/16 0441 10/24/16 0424  HGB 15.1  --  15.4  --   HCT 43.1  --  44.3  --   PLT 183  --  157  --   LABPROT  --  27.9* 26.5* 24.8*  INR  --  2.63 2.46 2.26  CREATININE 0.96  --  1.23  --     Assessment: Pharmacy consulted to dose and monitor warfarin in this 66 year old male admitted with cellulitis who was taking warfarin PTA for atrial fibrillation. Patient was recently prescribed cephalexin PTA and is currently on vancomycin and piperacillin/tazobactam.  Home regimen: warfarin 7.5 mg PO daily INR = 2.63 was therapeutic on admission  Dosing history DATE INR DOSE 9/26 2.63 7.5 mg (taken at home before admission) 9/27 2.46 7.5 mg 9/28 2.26 7.5 mg  Goal of Therapy:  INR 2-3 Monitor platelets by anticoagulation protocol: Yes   Plan:  INR is therapeutic. Will continue home dose of warfarin 7.5 mg PO daily and follow INR daily while on antibiotics.  Lenis Noon, Pharm.D., BCPS Clinical Pharmacist 10/24/2016,7:37 AM

## 2016-10-24 NOTE — Discharge Instructions (Signed)
Heart healthy and ADA diet. °

## 2016-10-24 NOTE — Progress Notes (Signed)
Discharge instructions and prescription reviewed with patient and wife. Appointment for PCP given to patient. IV removed without complication. Patient and wife voiced understanding of discharge instructions.

## 2016-10-24 NOTE — Discharge Summary (Signed)
[facility] at [place] NAME: Christian Berg    MR#:  [id number]  DATE OF BIRTH:  1950/06/09  DATE OF ADMISSION:  10/22/2016   ADMITTING PHYSICIAN: Gladstone Lighter, MD  DATE OF DISCHARGE: 10/24/2016  2:03 PM  PRIMARY CARE PHYSICIAN: [name], PA   ADMISSION DIAGNOSIS:  Swelling [R60.9] Cellulitis of left lower extremity [W97.989] DISCHARGE DIAGNOSIS:  Active Problems:   Cellulitis  SECONDARY DIAGNOSIS:   Past Medical History:  Diagnosis Date  . A-fib ([place])   . CHF (congestive heart failure) ([place])   . Degenerative disc disease, lumbar    s/p injury  . Diabetes mellitus without complication ([place])   . Disseminated superficial actinic porokeratosis   . Dysrhythmia    A-FIB, palpatations  . Hypercholesteremia   . Hypertension   . Kidney stones   . Peripheral vascular disease ([place])    Carotid stenosis  . Presence of permanent cardiac pacemaker    Inactive  . TIA (transient ischemic attack) 2011   No deficits   HOSPITAL COURSE:   Rogelio Waynick a 66 y.o. malewith a known history of persistent afib on warfarin, congestive heart failure, degenerative disc disease, diabetes mellitus, hypertension, disseminated superficial actinic photo keratosis presents to hospital secondary to worsening swelling, redness of his left leg.  #1 left lower extremity cellulitis Blood cultures negative so far. [place]: No evidence of DVT within the LEFT lower extremity. He has been treated with vancomycin and Zosyn. Patient was on Keflex as outpatient. -Keep the leg elevated Changed to clindamycin for 10 more days.  #2 persistent A. Fib Continue metoprolol and Cardizem. -On warfarin, INR is therapeutic.  #3 diabetes mellitus Hold metformin and glipizide. Most recent A1c is 9.5 On Lantus 14 units at at bedtime and sliding scale insulin in the hospital. Resume glipizide and metformin after discharge.  #4 Hypertension-on lisinopril,  metoprolol and Cardizem  Elevated bilirubin. Unclear etiology, Abdominal ultrasound: Hepatic steatosis.  PVD. Continue Coumadin. Morbid obesity.  DISCHARGE CONDITIONS:  Table, discharged to home today. CONSULTS OBTAINED:   DRUG ALLERGIES:   Allergies  Allergen Reactions  . Sulfa Antibiotics Hives   DISCHARGE MEDICATIONS:   Allergies as of 10/24/2016      Reactions   Sulfa Antibiotics Hives      Medication List    STOP taking these medications   cephALEXin 500 MG capsule Commonly known as:  KEFLEX     TAKE these medications   aspirin EC 81 MG tablet Take 81 mg by mouth every evening.   clindamycin 300 MG capsule Commonly known as:  CLEOCIN Take 2 capsules (600 mg total) by mouth 3 (three) times daily.   diltiazem 60 MG tablet Commonly known as:  CARDIZEM Take 1 tablet (60 mg total) by mouth every 8 (eight) hours.   glipiZIDE 10 MG tablet Commonly known as:  GLUCOTROL Take 1 tablet (10 mg total) by mouth 2 (two) times daily before a meal. Take 30 minutes before a meal   lansoprazole 15 MG capsule Commonly known as:  PREVACID Take 15-30 mg by mouth daily as needed. For heartburn/reflux   lisinopril 20 MG tablet Commonly known as:  PRINIVIL,ZESTRIL Take 1 tablet (20 mg total) by mouth daily.   lovastatin 10 MG tablet Commonly known as:  MEVACOR Take 2 tablets (20 mg total) by mouth daily. What changed:  when to take this   metFORMIN 850 MG tablet Commonly known as:  GLUCOPHAGE Take 850 mg by mouth 2 (two) times  daily with a meal.   metoprolol tartrate 100 MG tablet Commonly known as:  LOPRESSOR Take 1 tablet (100 mg total) by mouth at bedtime. What changed:  You were already taking a medication with the same name, and this prescription was added. Make sure you understand how and when to take each.   Metoprolol Tartrate 75 MG Tabs Take 150 mg by mouth daily. What changed:  medication strength  how much to take  when to take this  additional  instructions   nystatin cream Commonly known as:  MYCOSTATIN Apply 1 application topically 2 (two) times daily. What changed:  when to take this  reasons to take this   traMADol-acetaminophen 37.5-325 MG tablet Commonly known as:  ULTRACET Take 2 tablets by mouth 2 (two) times daily.   traMADol-acetaminophen 37.5-325 MG tablet Commonly known as:  ULTRACET Take 2 tablets by mouth as needed. Daily with lunch as needed for severe pain   warfarin 7.5 MG tablet Commonly known as:  COUMADIN Take 1 tablet (7.5 mg total) by mouth daily.            Discharge Care Instructions        Start     Ordered   10/25/16 0000  metoprolol tartrate 75 MG TABS  Daily     10/24/16 1051   10/24/16 0000  Increase activity slowly     10/24/16 1045   10/24/16 0000  Diet - low sodium heart healthy     10/24/16 1045   10/24/16 0000  clindamycin (CLEOCIN) 300 MG capsule  3 times daily     10/24/16 1049   10/24/16 0000  metoprolol tartrate (LOPRESSOR) 100 MG tablet  Daily at bedtime     10/24/16 1051       DISCHARGE INSTRUCTIONS:  See AVS.   If you experience worsening of your admission symptoms, develop shortness of breath, life threatening emergency, suicidal or homicidal thoughts you must seek medical attention immediately by calling 911 or calling your MD immediately  if symptoms less severe.  You Must read complete instructions/literature along with all the possible adverse reactions/side effects for all the Medicines you take and that have been prescribed to you. Take any new Medicines after you have completely understood and accpet all the possible adverse reactions/side effects.   Please note  You were cared for by a hospitalist during your hospital stay. If you have any questions about your discharge medications or the care you received while you were in the hospital after you are discharged, you can call the unit and asked to speak with the hospitalist on call if the hospitalist  that took care of you is not available. Once you are discharged, your primary care physician will handle any further medical issues. Please note that NO REFILLS for any discharge medications will be authorized once you are discharged, as it is imperative that you return to your primary care physician (or establish a relationship with a primary care physician if you do not have one) for your aftercare needs so that they can reassess your need for medications and monitor your lab values.    On the day of Discharge:  VITAL SIGNS:  Blood pressure (!) 146/74, pulse 90, temperature 99.3 F (37.4 C), temperature source Oral, resp. rate 18, height 5' 8"  (1.727 m), weight (!) 301 lb (136.5 kg), SpO2 97 %. PHYSICAL EXAMINATION:  GENERAL:  66 y.o.-year-old patient lying in the bed with no acute distress.  EYES: Pupils equal, round, reactive to  light and accommodation. No scleral icterus. Extraocular muscles intact.  HEENT: Head atraumatic, normocephalic. Oropharynx and nasopharynx clear.  NECK:  Supple, no jugular venous distention. No thyroid enlargement, no tenderness.  LUNGS: Normal breath sounds bilaterally, no wheezing, rales,rhonchi or crepitation. No use of accessory muscles of respiration.  CARDIOVASCULAR: S1, S2 normal. No murmurs, rubs, or gallops.  ABDOMEN: Soft, non-tender, non-distended. Bowel sounds present. No organomegaly or mass.  EXTREMITIES: No pedal edema, cyanosis, or clubbing.  Left leg swelling and mild tenderness.  NEUROLOGIC: Cranial nerves II through XII are intact. Muscle strength 5/5 in all extremities. Sensation intact. Gait not checked.  PSYCHIATRIC: The patient is alert and oriented x 3.  SKIN: No obvious rash, lesion, or ulcer.  DATA REVIEW:   CBC  Recent Labs Lab 10/23/16 0441  WBC 8.3  HGB 15.4  HCT 44.3  PLT 157    Chemistries   Recent Labs Lab 10/22/16 0825 10/23/16 0441 10/24/16 0424  NA 136 139  --   K 4.4 3.6  --   CL 101 103  --   CO2 25 26  --    GLUCOSE 298* 162*  --   BUN 9 14  --   CREATININE 0.96 1.23  --   CALCIUM 9.1 9.2  --   AST 46*  --   --   ALT 28  --   --   ALKPHOS 96  --   --   BILITOT 1.9*  --  1.8*     Microbiology Results  Results for orders placed or performed during the hospital encounter of 10/22/16  Culture, blood (routine x 2)     Status: None (Preliminary result)   Collection Time: 10/22/16 10:43 AM  Result Value Ref Range Status   Specimen Description BLOOD RIGHT HAND  Final   Special Requests   Final    BOTTLES DRAWN AEROBIC AND ANAEROBIC Blood Culture results may not be optimal due to an inadequate volume of blood received in culture bottles   Culture NO GROWTH 2 DAYS  Final   Report Status PENDING  Incomplete  Culture, blood (routine x 2)     Status: None (Preliminary result)   Collection Time: 10/22/16 10:43 AM  Result Value Ref Range Status   Specimen Description BLOOD RIGHT AC  Final   Special Requests   Final    BOTTLES DRAWN AEROBIC AND ANAEROBIC Blood Culture results may not be optimal due to an inadequate volume of blood received in culture bottles   Culture NO GROWTH 2 DAYS  Final   Report Status PENDING  Incomplete    RADIOLOGY:  US Abdomen Limited Ruq  Result Date: 10/24/2016 CLINICAL DATA:  66 year old with elevated bilirubin. EXAM: ULTRASOUND ABDOMEN LIMITED RIGHT UPPER QUADRANT COMPARISON:  CT dated in 08/1915 FINDINGS: Gallbladder: No gallstones or wall thickening visualized. No sonographic Murphy sign noted by sonographer. Common bile duct: Diameter: 0.4 cm Liver: Liver is difficult to evaluate. However, the liver is echogenic and heterogeneous. Limited evaluation for a hepatic lesion. Portal vein is patent on color Doppler imaging with normal direction of blood flow towards the liver. IMPRESSION: Hepatic steatosis.  Limited examination of the liver. Normal appearance of the gallbladder without stones and no biliary dilatation. Electronically Signed   By: Markus Daft M.D.   On:  10/24/2016 12:50     Management plans discussed with the patient, family and they are in agreement.  CODE STATUS: Full Code   TOTAL TIME TAKING CARE OF THIS PATIENT: 32 minutes.  Demetrios Loll M.D on 10/24/2016 at 4:27 PM  Between 7am to 6pm - Pager - [phone]  After 6pm go to [email or web address] - Proofreader  [facility] [place] [facility]  Office  [phone]  CC: Primary care physician; [name], PA   Note: This dictation was prepared with Dragon dictation along with smaller phrase technology. Any transcriptional errors that result from this process are unintentional.

## 2016-10-27 LAB — CULTURE, BLOOD (ROUTINE X 2)
Culture: NO GROWTH
Culture: NO GROWTH

## 2016-10-27 MED ORDER — DOXYCYCLINE HYCLATE 100 MG PO CAPS: 100.0000 mg | ORAL_CAPSULE | Freq: Two times a day (BID) | ORAL | 0 refills | Status: DC

## 2016-10-27 MED ORDER — BUMETANIDE 1 MG PO TABS: 1.0000 mg | ORAL_TABLET | Freq: Every day | ORAL | 0 refills | Status: DC

## 2016-10-27 NOTE — Progress Notes (Signed)
Reports possible reaction to Clindamycin (face turned red, couldn't breathe for few seconds, choking?).  He feels Leg is more red and swollen - will switch over him to Doxy and Bumex considering sulfa allergy.  Has f/up appt with his PCP on 10.8

## 2016-11-03 ENCOUNTER — Encounter: Payer: Self-pay | Admitting: Family Medicine

## 2016-11-03 ENCOUNTER — Ambulatory Visit (INDEPENDENT_AMBULATORY_CARE_PROVIDER_SITE_OTHER): Payer: Medicare Other | Admitting: Family Medicine

## 2016-11-03 VITALS — BP 144/100 | HR 95 | Temp 99.1°F | Resp 16 | Wt 299.2 lb

## 2016-11-03 DIAGNOSIS — R197 Diarrhea, unspecified: Secondary | ICD-10-CM | POA: Diagnosis not present

## 2016-11-03 DIAGNOSIS — Z9189 Other specified personal risk factors, not elsewhere classified: Secondary | ICD-10-CM | POA: Diagnosis not present

## 2016-11-03 DIAGNOSIS — IMO0001 Reserved for inherently not codable concepts without codable children: Secondary | ICD-10-CM

## 2016-11-03 NOTE — Progress Notes (Signed)
Subjective:     Patient ID: Christian Berg, male   DOB: 03/28/1950, 66 y.o.   MRN: [id number]  HPI  Chief Complaint  Patient presents with  . Hospitalization Follow-up    Patient comes in office today for hospital follow up after being admitted to [facility] on 10/22/16 with complaints of left leg swelling. Patient was discharged on 10/24/16 and diagnosed with cellulitis of lower extremity. Patient was stopped on Keflex and started on Doyxycyline, patient reports that he its still having pain, swelling and blisters on legs.   . Back Pain    Patient descibes pain and tremors inback radiating up to his head for one week causing stiffness in back and difficulty moving. Patient describes pain as shooting and states that it is paralyzing at times.   He has one more day remaining on doxycycline. Erythema has improved in his left leg except for residual area of erythema/tenderness over the dorsum of his foot. He reports multiple episodes of loose watery diarrhea. Also reports he will discuss his back spasms with his pain doctor tomorrow. Accompanied by his wife today.   Review of Systems  Gastrointestinal:       Also discussed ongoing f/u with [facility] Due to hx of fatty liver and elevated liver tests. Does not wish to return to Dr. Allen Norris due to previous medical misadventure.       Objective:   Physical Exam  Constitutional: He appears well-developed and well-nourished. No distress.  Skin:  Mild swelling/erythema of the dorsum of his left foot. Pedal pulses intact. No erythema noted proximally.       Assessment:    1. Transition of care performed with sharing of clinical summary: would liketo extend course of doxycyline for cellulitis but will check for C.Diff. first.  2. Diarrhea, unspecified type - C. difficile GDH and Toxin A/B    Plan:    Further f/u pending C.diff testing.

## 2016-11-03 NOTE — Patient Instructions (Signed)
We will call you with the lab results. At that time will reconsider whether to keep you on antibiotics longer. Continue leg elevation.

## 2016-11-04 ENCOUNTER — Other Ambulatory Visit: Payer: Self-pay | Admitting: Family Medicine

## 2016-11-04 ENCOUNTER — Telehealth: Payer: Self-pay

## 2016-11-04 LAB — C. DIFFICILE GDH AND TOXIN A/B
GDH ANTIGEN: NOT DETECTED
MICRO NUMBER:: 81120128
SPECIMEN QUALITY:: ADEQUATE
TOXIN A AND B: NOT DETECTED

## 2016-11-04 MED ORDER — DOXYCYCLINE HYCLATE 100 MG PO CAPS
100.0000 mg | ORAL_CAPSULE | Freq: Two times a day (BID) | ORAL | 0 refills | Status: DC
Start: 2016-11-04 — End: 2017-02-02

## 2016-11-04 NOTE — Telephone Encounter (Signed)
-----   Message from [name], [place] sent at 11/04/2016  7:26 AM EDT ----- Your stool test was ok. Start imodium over the counter for diarrhea. I will extend your antibiotics (doxycycline) for another 4 days.

## 2016-11-04 NOTE — Telephone Encounter (Signed)
[id number] 

## 2016-11-05 NOTE — Telephone Encounter (Signed)
Patient advised.

## 2016-11-12 ENCOUNTER — Other Ambulatory Visit: Payer: Self-pay | Admitting: Family Medicine

## 2016-11-12 DIAGNOSIS — I10 Essential (primary) hypertension: Secondary | ICD-10-CM

## 2016-12-10 DIAGNOSIS — I495 Sick sinus syndrome: Secondary | ICD-10-CM | POA: Diagnosis not present

## 2016-12-10 DIAGNOSIS — I5042 Chronic combined systolic (congestive) and diastolic (congestive) heart failure: Secondary | ICD-10-CM | POA: Diagnosis not present

## 2016-12-10 DIAGNOSIS — I739 Peripheral vascular disease, unspecified: Secondary | ICD-10-CM | POA: Diagnosis not present

## 2016-12-10 DIAGNOSIS — I10 Essential (primary) hypertension: Secondary | ICD-10-CM | POA: Diagnosis not present

## 2016-12-10 DIAGNOSIS — E782 Mixed hyperlipidemia: Secondary | ICD-10-CM | POA: Diagnosis not present

## 2016-12-10 DIAGNOSIS — E119 Type 2 diabetes mellitus without complications: Secondary | ICD-10-CM | POA: Diagnosis not present

## 2016-12-10 DIAGNOSIS — I272 Pulmonary hypertension, unspecified: Secondary | ICD-10-CM | POA: Diagnosis not present

## 2016-12-10 DIAGNOSIS — I6523 Occlusion and stenosis of bilateral carotid arteries: Secondary | ICD-10-CM | POA: Diagnosis not present

## 2016-12-10 DIAGNOSIS — I482 Chronic atrial fibrillation: Secondary | ICD-10-CM | POA: Diagnosis not present

## 2017-01-08 DIAGNOSIS — I6523 Occlusion and stenosis of bilateral carotid arteries: Secondary | ICD-10-CM | POA: Diagnosis not present

## 2017-01-08 DIAGNOSIS — Z6841 Body Mass Index (BMI) 40.0 and over, adult: Secondary | ICD-10-CM | POA: Diagnosis not present

## 2017-01-08 DIAGNOSIS — I10 Essential (primary) hypertension: Secondary | ICD-10-CM | POA: Diagnosis not present

## 2017-01-08 DIAGNOSIS — I482 Chronic atrial fibrillation: Secondary | ICD-10-CM | POA: Diagnosis not present

## 2017-01-16 DIAGNOSIS — I482 Chronic atrial fibrillation: Secondary | ICD-10-CM | POA: Diagnosis not present

## 2017-01-21 DIAGNOSIS — I482 Chronic atrial fibrillation: Secondary | ICD-10-CM | POA: Diagnosis not present

## 2017-01-21 DIAGNOSIS — I6523 Occlusion and stenosis of bilateral carotid arteries: Secondary | ICD-10-CM | POA: Diagnosis not present

## 2017-02-01 ENCOUNTER — Other Ambulatory Visit: Payer: Self-pay | Admitting: Family Medicine

## 2017-02-02 ENCOUNTER — Other Ambulatory Visit: Payer: Self-pay | Admitting: Family Medicine

## 2017-02-02 ENCOUNTER — Ambulatory Visit (INDEPENDENT_AMBULATORY_CARE_PROVIDER_SITE_OTHER): Payer: Medicare HMO | Admitting: Family Medicine

## 2017-02-02 ENCOUNTER — Encounter: Payer: Self-pay | Admitting: Family Medicine

## 2017-02-02 VITALS — BP 164/118 | HR 106 | Temp 98.8°F | Resp 16 | Wt 296.8 lb

## 2017-02-02 DIAGNOSIS — I482 Chronic atrial fibrillation, unspecified: Secondary | ICD-10-CM

## 2017-02-02 DIAGNOSIS — I10 Essential (primary) hypertension: Secondary | ICD-10-CM | POA: Diagnosis not present

## 2017-02-02 DIAGNOSIS — E119 Type 2 diabetes mellitus without complications: Secondary | ICD-10-CM

## 2017-02-02 DIAGNOSIS — I6523 Occlusion and stenosis of bilateral carotid arteries: Secondary | ICD-10-CM | POA: Diagnosis not present

## 2017-02-02 DIAGNOSIS — J069 Acute upper respiratory infection, unspecified: Secondary | ICD-10-CM | POA: Diagnosis not present

## 2017-02-02 DIAGNOSIS — I495 Sick sinus syndrome: Secondary | ICD-10-CM | POA: Diagnosis not present

## 2017-02-02 DIAGNOSIS — Z6841 Body Mass Index (BMI) 40.0 and over, adult: Secondary | ICD-10-CM | POA: Diagnosis not present

## 2017-02-02 LAB — POCT INR
INR: 2.9
Prothrombin Time: 34.7

## 2017-02-02 LAB — POCT GLYCOSYLATED HEMOGLOBIN (HGB A1C): Hemoglobin A1C: 8.2

## 2017-02-02 NOTE — Patient Instructions (Addendum)
Do follow up with Dr. Nehemiah Massed as scheduled today. Discussed use of saline nasal spray, Delsym for cough. And Claritin for day time drainage and Benadryl at night for postnasal drainage/cough. If sinuses not improving in 5-6 days call me. Return for INR/protime in 2 months. Make sure your are taking your glipizide 30 minutes before your two largest meals of the day.

## 2017-02-02 NOTE — Progress Notes (Signed)
Subjective:     Patient ID: Christian Berg, male   DOB: [birth year], 67 y.o.   MRN: [id number] Chief Complaint  Patient presents with  . Sore Throat    Patient comes in ofice today with complaints of sore throat on the left side for the past 2 days. Patient reports that he has had post nasal drip and difficulty swallowing.  . Diabetes    Patient returns for follow up last office visit was 10/06/16 HGlipizide was increased for improved control. HgBA1C at last visit was 9.5%, patient denies any hyperglycemia incidents since last visit and reports good compliance on medication.    HPI Also is overdue for A1c and INR. States he does not take his glipizide until after his meal. Currently being closely followed by Dr. Nehemiah Massed, cardiology, for A.Fib and HTN. He has appointment today. Accompanied by his wife today.  Review of Systems  Constitutional: Negative for chills and fever.       Objective:   Physical Exam  Constitutional: He appears well-developed and well-nourished. No distress.  Ears: T.M's intact without inflammation Throat: no tonsillar enlargement or exudate; mild posterior pharyngeal erythema Neck: left anterior cervical node Lungs: clear     Assessment:    1. Viral upper respiratory tract infectio  2. Chronic atrial fibrillation [facility]): continue Coumadin 7.5 mg. - POCT INR  3. Type 2 diabetes mellitus without complication, without long-term current use of insulin (HCC: improved-instructed to take glipizide before meals. - POCT glycosylated hemoglobin (Hb A1C)    Plan:    Discussed use of otc medication and to avoid decongestants.

## 2017-02-20 ENCOUNTER — Ambulatory Visit (INDEPENDENT_AMBULATORY_CARE_PROVIDER_SITE_OTHER): Payer: Medicare HMO | Admitting: Family Medicine

## 2017-02-20 ENCOUNTER — Encounter: Payer: Self-pay | Admitting: Family Medicine

## 2017-02-20 VITALS — BP 160/110 | HR 120 | Temp 101.2°F | Resp 18 | Wt 289.4 lb

## 2017-02-20 DIAGNOSIS — R509 Fever, unspecified: Secondary | ICD-10-CM

## 2017-02-20 DIAGNOSIS — B349 Viral infection, unspecified: Secondary | ICD-10-CM

## 2017-02-20 LAB — POCT INFLUENZA A/B
Influenza A, POC: NEGATIVE
Influenza B, POC: NEGATIVE

## 2017-02-20 MED ORDER — OSELTAMIVIR PHOSPHATE 75 MG PO CAPS: 75.0000 mg | ORAL_CAPSULE | Freq: Two times a day (BID) | ORAL | 0 refills | Status: DC

## 2017-02-20 NOTE — Progress Notes (Signed)
Subjective:     Patient ID: Christian Berg, male   DOB: [birth year], 67 y.o.   MRN: [id number] Chief Complaint  Patient presents with  . Fever    Patient comes in office today with complaints of fever high of 100, headache, and body ache. Patient states that symptoms began yesterday afternoon, he reports having decreased appetite and fatigue.    HPI Also reports one episode of watery loose stool and cough. No nausea or vomiting. + flu shot. Accompanied by his wife.  Review of Systems     Objective:   Physical Exam  Constitutional: He appears well-developed and well-nourished. He has a sickly appearance.  Ears: T.M's intact without inflammation Throat: no tonsillar enlargement or exudate Neck: no cervical adenopathy Lungs: clear Abdomen: mild epigastric tenderness without guarding     Assessment:    1. Fever, unspecified fever cause - POCT Influenza A/B  2. Acute viral syndrome: suspect flu though test negative. - oseltamivir (TAMIFLU) 75 MG capsule; Take 1 capsule (75 mg total) by mouth 2 (two) times daily.  Dispense: 10 capsule; Refill: 0    Plan:    Monitor for further symptoms. Hold  glipizide if not eating or 1/2 dose otherwise.   Encouraged fluids.

## 2017-02-20 NOTE — Patient Instructions (Signed)
If you are not eating don't take the glipizide otherwise take 1/2 the usual dose. Keep up with fluids and eat as tolerated.

## 2017-02-23 ENCOUNTER — Ambulatory Visit (INDEPENDENT_AMBULATORY_CARE_PROVIDER_SITE_OTHER): Payer: Medicare HMO | Admitting: Family Medicine

## 2017-02-23 ENCOUNTER — Encounter: Payer: Self-pay | Admitting: Family Medicine

## 2017-02-23 VITALS — BP 156/96 | HR 127 | Temp 99.1°F | Resp 17 | Wt 292.0 lb

## 2017-02-23 DIAGNOSIS — N3091 Cystitis, unspecified with hematuria: Secondary | ICD-10-CM | POA: Diagnosis not present

## 2017-02-23 LAB — POCT URINALYSIS DIPSTICK
Bilirubin, UA: NEGATIVE
Glucose, UA: 2000
Ketones, UA: NEGATIVE
Nitrite, UA: POSITIVE
Spec Grav, UA: 1.01 (ref 1.010–1.025)
Urobilinogen, UA: 1 E.U./dL
pH, UA: 5 (ref 5.0–8.0)

## 2017-02-23 MED ORDER — CEPHALEXIN 500 MG PO CAPS: 500.0000 mg | ORAL_CAPSULE | Freq: Two times a day (BID) | ORAL | 0 refills | Status: DC

## 2017-02-23 NOTE — Progress Notes (Signed)
Subjective:     Patient ID: Christian Berg, male   DOB: 1950/05/05, 67 y.o.   MRN: [id number] Chief Complaint  Patient presents with  . Urinary Tract Infection    Patient returns back to office today with complaints of low grade fever, difficulty emptying blader, frequency of urination and blood in urine.    HPI Saw him last week and felt he was developing the flu. States he started the Tamiflu and cough and fevers have lessened. Accompanied by his wife today.  Review of Systems     Objective:   Physical Exam  Constitutional: He appears well-developed and well-nourished. No distress.  Genitourinary:  Genitourinary Comments: No cva tenderness       Assessment:    1. Cystitis with hematuria - POCT urinalysis dipstick - Urine Culture - cephALEXin (KEFLEX) 500 MG capsule; Take 1 capsule (500 mg total) by mouth 2 (two) times daily.  Dispense: 14 capsule; Refill: 0    Plan:    Further f/u pending urine culture results.

## 2017-02-23 NOTE — Patient Instructions (Signed)
We will call you with the urine culture result. Use a glycerin or Dulcolax suppository for constipation.

## 2017-02-27 ENCOUNTER — Telehealth: Payer: Self-pay | Admitting: Emergency Medicine

## 2017-02-27 LAB — URINE CULTURE

## 2017-02-27 NOTE — Telephone Encounter (Signed)
-----   Message from [name], [place] sent at 02/27/2017  9:34 AM EST ----- Your E. Coli infection is sensitive to the cephalexin you are taking-are you feeling better?

## 2017-02-27 NOTE — Telephone Encounter (Signed)
Pt advised. He reports that he is feeling better.

## 2017-03-05 ENCOUNTER — Encounter: Payer: Self-pay | Admitting: Family Medicine

## 2017-03-05 ENCOUNTER — Ambulatory Visit (INDEPENDENT_AMBULATORY_CARE_PROVIDER_SITE_OTHER): Payer: Medicare HMO | Admitting: Family Medicine

## 2017-03-05 VITALS — BP 142/90 | HR 107 | Temp 98.5°F | Resp 16 | Wt 290.8 lb

## 2017-03-05 DIAGNOSIS — N3001 Acute cystitis with hematuria: Secondary | ICD-10-CM

## 2017-03-05 LAB — POCT URINALYSIS DIPSTICK
Bilirubin, UA: NEGATIVE
Glucose, UA: 1000
Ketones, UA: NEGATIVE
Nitrite, UA: POSITIVE
Spec Grav, UA: 1.025 (ref 1.010–1.025)
Urobilinogen, UA: 0.2 E.U./dL
pH, UA: 5 (ref 5.0–8.0)

## 2017-03-05 MED ORDER — DOXYCYCLINE HYCLATE 100 MG PO TABS: 100.0000 mg | ORAL_TABLET | Freq: Two times a day (BID) | ORAL | 0 refills | Status: DC

## 2017-03-05 NOTE — Progress Notes (Signed)
Subjective:     Patient ID: RISHAV ROCKEFELLER, male   DOB: 1950-09-05, 67 y.o.   MRN: [id number] Chief Complaint  Patient presents with  . Urinary Tract Infection    Patient returns to office today for follow up from 02/23/17 patient reports that he completed antibiotic but symptoms never cleared. Patient reports that two nights ago he started with night sweats, fever and lower back pain on the right side.    HPI States symptoms recurred soon after completing cephalexin. Accompanied by his wife today.  Review of Systems     Objective:   Physical Exam  Constitutional: He appears well-developed and well-nourished. No distress.       Assessment:    1. Acute cystitis with hematuria: urine sample too small for culture. Will rx based on past culture. - POCT urinalysis dipstick - doxycycline (VIBRA-TABS) 100 MG tablet; Take 1 tablet (100 mg total) by mouth 2 (two) times daily.  Dispense: 14 tablet; Refill: 0    Plan:    Further f/u if not improving/consider refer back to [facility]. Get Protime next week.

## 2017-03-05 NOTE — Patient Instructions (Addendum)
Let me know if you are not improving on doxycycline. Get your pro-time (blood thinning time) next week. Continue increased fluids.

## 2017-03-11 ENCOUNTER — Ambulatory Visit: Payer: Self-pay

## 2017-03-13 ENCOUNTER — Ambulatory Visit (INDEPENDENT_AMBULATORY_CARE_PROVIDER_SITE_OTHER): Payer: Medicare HMO

## 2017-03-13 DIAGNOSIS — I482 Chronic atrial fibrillation, unspecified: Secondary | ICD-10-CM

## 2017-03-13 LAB — POCT INR
INR: 4.3
PT: 51

## 2017-03-13 NOTE — Patient Instructions (Signed)
Hold for two day; then restart current dose 7.33m daily. Recheck at the surgery center Monday.

## 2017-03-16 ENCOUNTER — Ambulatory Visit (INDEPENDENT_AMBULATORY_CARE_PROVIDER_SITE_OTHER): Payer: Medicare HMO | Admitting: Family Medicine

## 2017-03-16 DIAGNOSIS — I482 Chronic atrial fibrillation, unspecified: Secondary | ICD-10-CM

## 2017-03-16 LAB — POCT INR
INR: 2.1
PT: 25.6

## 2017-03-16 NOTE — Patient Instructions (Signed)
Take 7.5 mg daily. Recheck in 1 month 04/13/2017.

## 2017-03-16 NOTE — Progress Notes (Signed)
PT/INR only

## 2017-03-30 ENCOUNTER — Encounter
Admission: EM | Disposition: A | Discharge status: Skilled Nursing Facility | Payer: Self-pay | Source: Home / Self Care | Attending: Internal Medicine

## 2017-03-30 ENCOUNTER — Emergency Department: Payer: Medicare HMO

## 2017-03-30 ENCOUNTER — Emergency Department: Payer: Medicare HMO | Admitting: Anesthesiology

## 2017-03-30 ENCOUNTER — Inpatient Hospital Stay
Admission: EM | Admit: 2017-03-30 | Discharge: 2017-04-10 | DRG: 152 | Disposition: A | Discharge status: Skilled Nursing Facility | Payer: Medicare HMO | Attending: Specialist | Admitting: Specialist

## 2017-03-30 ENCOUNTER — Encounter: Payer: Self-pay | Admitting: Radiology

## 2017-03-30 ENCOUNTER — Inpatient Hospital Stay: Payer: Medicare HMO

## 2017-03-30 ENCOUNTER — Other Ambulatory Visit: Payer: Self-pay

## 2017-03-30 DIAGNOSIS — Z950 Presence of cardiac pacemaker: Secondary | ICD-10-CM

## 2017-03-30 DIAGNOSIS — Z7984 Long term (current) use of oral hypoglycemic drugs: Secondary | ICD-10-CM

## 2017-03-30 DIAGNOSIS — J45909 Unspecified asthma, uncomplicated: Secondary | ICD-10-CM | POA: Diagnosis not present

## 2017-03-30 DIAGNOSIS — E1151 Type 2 diabetes mellitus with diabetic peripheral angiopathy without gangrene: Secondary | ICD-10-CM | POA: Diagnosis not present

## 2017-03-30 DIAGNOSIS — N289 Disorder of kidney and ureter, unspecified: Secondary | ICD-10-CM | POA: Diagnosis not present

## 2017-03-30 DIAGNOSIS — Z8673 Personal history of transient ischemic attack (TIA), and cerebral infarction without residual deficits: Secondary | ICD-10-CM | POA: Diagnosis not present

## 2017-03-30 DIAGNOSIS — Z7401 Bed confinement status: Secondary | ICD-10-CM | POA: Diagnosis not present

## 2017-03-30 DIAGNOSIS — Z8249 Family history of ischemic heart disease and other diseases of the circulatory system: Secondary | ICD-10-CM

## 2017-03-30 DIAGNOSIS — J9601 Acute respiratory failure with hypoxia: Secondary | ICD-10-CM

## 2017-03-30 DIAGNOSIS — E1165 Type 2 diabetes mellitus with hyperglycemia: Secondary | ICD-10-CM | POA: Diagnosis not present

## 2017-03-30 DIAGNOSIS — T380X5A Adverse effect of glucocorticoids and synthetic analogues, initial encounter: Secondary | ICD-10-CM | POA: Diagnosis not present

## 2017-03-30 DIAGNOSIS — R2681 Unsteadiness on feet: Secondary | ICD-10-CM | POA: Diagnosis not present

## 2017-03-30 DIAGNOSIS — B370 Candidal stomatitis: Secondary | ICD-10-CM | POA: Diagnosis not present

## 2017-03-30 DIAGNOSIS — L89311 Pressure ulcer of right buttock, stage 1: Secondary | ICD-10-CM | POA: Diagnosis present

## 2017-03-30 DIAGNOSIS — I482 Chronic atrial fibrillation: Secondary | ICD-10-CM | POA: Diagnosis present

## 2017-03-30 DIAGNOSIS — Z882 Allergy status to sulfonamides status: Secondary | ICD-10-CM | POA: Diagnosis not present

## 2017-03-30 DIAGNOSIS — J0510 Acute epiglottitis without obstruction: Secondary | ICD-10-CM | POA: Diagnosis not present

## 2017-03-30 DIAGNOSIS — L8990 Pressure ulcer of unspecified site, unspecified stage: Secondary | ICD-10-CM

## 2017-03-30 DIAGNOSIS — E785 Hyperlipidemia, unspecified: Secondary | ICD-10-CM | POA: Diagnosis present

## 2017-03-30 DIAGNOSIS — R4182 Altered mental status, unspecified: Secondary | ICD-10-CM | POA: Diagnosis not present

## 2017-03-30 DIAGNOSIS — F05 Delirium due to known physiological condition: Secondary | ICD-10-CM | POA: Diagnosis not present

## 2017-03-30 DIAGNOSIS — R000 Tachycardia, unspecified: Secondary | ICD-10-CM | POA: Diagnosis not present

## 2017-03-30 DIAGNOSIS — I495 Sick sinus syndrome: Secondary | ICD-10-CM | POA: Diagnosis not present

## 2017-03-30 DIAGNOSIS — E119 Type 2 diabetes mellitus without complications: Secondary | ICD-10-CM | POA: Diagnosis not present

## 2017-03-30 DIAGNOSIS — Z881 Allergy status to other antibiotic agents status: Secondary | ICD-10-CM | POA: Diagnosis not present

## 2017-03-30 DIAGNOSIS — J9811 Atelectasis: Secondary | ICD-10-CM | POA: Diagnosis not present

## 2017-03-30 DIAGNOSIS — Z87442 Personal history of urinary calculi: Secondary | ICD-10-CM

## 2017-03-30 DIAGNOSIS — R52 Pain, unspecified: Secondary | ICD-10-CM | POA: Diagnosis not present

## 2017-03-30 DIAGNOSIS — Z452 Encounter for adjustment and management of vascular access device: Secondary | ICD-10-CM | POA: Diagnosis not present

## 2017-03-30 DIAGNOSIS — L89321 Pressure ulcer of left buttock, stage 1: Secondary | ICD-10-CM | POA: Diagnosis present

## 2017-03-30 DIAGNOSIS — I5032 Chronic diastolic (congestive) heart failure: Secondary | ICD-10-CM | POA: Diagnosis present

## 2017-03-30 DIAGNOSIS — E662 Morbid (severe) obesity with alveolar hypoventilation: Secondary | ICD-10-CM | POA: Diagnosis not present

## 2017-03-30 DIAGNOSIS — B369 Superficial mycosis, unspecified: Secondary | ICD-10-CM | POA: Diagnosis not present

## 2017-03-30 DIAGNOSIS — Z6841 Body Mass Index (BMI) 40.0 and over, adult: Secondary | ICD-10-CM | POA: Diagnosis not present

## 2017-03-30 DIAGNOSIS — R0603 Acute respiratory distress: Secondary | ICD-10-CM | POA: Diagnosis not present

## 2017-03-30 DIAGNOSIS — J0430 Supraglottitis, unspecified, without obstruction: Secondary | ICD-10-CM | POA: Diagnosis present

## 2017-03-30 DIAGNOSIS — M6281 Muscle weakness (generalized): Secondary | ICD-10-CM | POA: Diagnosis not present

## 2017-03-30 DIAGNOSIS — J029 Acute pharyngitis, unspecified: Secondary | ICD-10-CM | POA: Diagnosis not present

## 2017-03-30 DIAGNOSIS — I110 Hypertensive heart disease with heart failure: Secondary | ICD-10-CM | POA: Diagnosis not present

## 2017-03-30 DIAGNOSIS — R410 Disorientation, unspecified: Secondary | ICD-10-CM | POA: Diagnosis not present

## 2017-03-30 DIAGNOSIS — Z5189 Encounter for other specified aftercare: Secondary | ICD-10-CM | POA: Diagnosis not present

## 2017-03-30 DIAGNOSIS — Z4682 Encounter for fitting and adjustment of non-vascular catheter: Secondary | ICD-10-CM | POA: Diagnosis not present

## 2017-03-30 DIAGNOSIS — Z4659 Encounter for fitting and adjustment of other gastrointestinal appliance and device: Secondary | ICD-10-CM

## 2017-03-30 DIAGNOSIS — I10 Essential (primary) hypertension: Secondary | ICD-10-CM | POA: Diagnosis not present

## 2017-03-30 DIAGNOSIS — G9341 Metabolic encephalopathy: Secondary | ICD-10-CM | POA: Diagnosis not present

## 2017-03-30 DIAGNOSIS — Z978 Presence of other specified devices: Secondary | ICD-10-CM

## 2017-03-30 DIAGNOSIS — I4891 Unspecified atrial fibrillation: Secondary | ICD-10-CM | POA: Diagnosis not present

## 2017-03-30 DIAGNOSIS — J342 Deviated nasal septum: Secondary | ICD-10-CM | POA: Diagnosis present

## 2017-03-30 DIAGNOSIS — E7800 Pure hypercholesterolemia, unspecified: Secondary | ICD-10-CM | POA: Diagnosis not present

## 2017-03-30 DIAGNOSIS — Z7982 Long term (current) use of aspirin: Secondary | ICD-10-CM

## 2017-03-30 HISTORY — DX: Acute epiglottitis without obstruction: J05.10

## 2017-03-30 HISTORY — PX: INTUBATION-ENDOTRACHEAL WITH TRACHEOSTOMY STANDBY: SHX6592

## 2017-03-30 LAB — CBC WITH DIFFERENTIAL/PLATELET
Basophils Absolute: 0 10*3/uL (ref 0–0.1)
Basophils Relative: 0 %
Eosinophils Absolute: 0.2 10*3/uL (ref 0–0.7)
Eosinophils Relative: 3 %
HCT: 38.9 % — ABNORMAL LOW (ref 40.0–52.0)
Hemoglobin: 13.5 g/dL (ref 13.0–18.0)
Lymphocytes Relative: 16 %
Lymphs Abs: 1.2 10*3/uL (ref 1.0–3.6)
MCH: 34.7 pg — ABNORMAL HIGH (ref 26.0–34.0)
MCHC: 34.8 g/dL (ref 32.0–36.0)
MCV: 99.7 fL (ref 80.0–100.0)
Monocytes Absolute: 0.4 10*3/uL (ref 0.2–1.0)
Monocytes Relative: 5 %
Neutro Abs: 5.7 10*3/uL (ref 1.4–6.5)
Neutrophils Relative %: 76 %
Platelets: 192 10*3/uL (ref 150–440)
RBC: 3.9 MIL/uL — ABNORMAL LOW (ref 4.40–5.90)
RDW: 14.1 % (ref 11.5–14.5)
WBC: 7.6 10*3/uL (ref 3.8–10.6)

## 2017-03-30 LAB — COMPREHENSIVE METABOLIC PANEL
ALT: 19 U/L (ref 17–63)
AST: 45 U/L — ABNORMAL HIGH (ref 15–41)
Albumin: 3.2 g/dL — ABNORMAL LOW (ref 3.5–5.0)
Alkaline Phosphatase: 99 U/L (ref 38–126)
Anion gap: 9 (ref 5–15)
BUN: 9 mg/dL (ref 6–20)
CO2: 25 mmol/L (ref 22–32)
Calcium: 9 mg/dL (ref 8.9–10.3)
Chloride: 105 mmol/L (ref 101–111)
Creatinine, Ser: 1.41 mg/dL — ABNORMAL HIGH (ref 0.61–1.24)
GFR calc Af Amer: 58 mL/min — ABNORMAL LOW (ref >60)
GFR calc non Af Amer: 50 mL/min — ABNORMAL LOW (ref >60)
Glucose, Bld: 205 mg/dL — ABNORMAL HIGH (ref 65–99)
Potassium: 4 mmol/L (ref 3.5–5.1)
Sodium: 139 mmol/L (ref 135–145)
Total Bilirubin: 0.9 mg/dL (ref 0.3–1.2)
Total Protein: 7.9 g/dL (ref 6.5–8.1)

## 2017-03-30 LAB — BLOOD GAS, ARTERIAL
Acid-base deficit: 0.6 mmol/L (ref 0.0–2.0)
Bicarbonate: 25.4 mmol/L (ref 20.0–28.0)
FIO2: 0.35
MECHVT: 500 mL
O2 Saturation: 97.1 %
PEEP: 5 cmH2O
Patient temperature: 37
RATE: 15 resp/min
pCO2 arterial: 46 mmHg (ref 32.0–48.0)
pH, Arterial: 7.35 (ref 7.350–7.450)
pO2, Arterial: 96 mmHg (ref 83.0–108.0)

## 2017-03-30 LAB — TYPE AND SCREEN
ABO/RH(D): O POS
Antibody Screen: NEGATIVE

## 2017-03-30 LAB — GLUCOSE, CAPILLARY
Glucose-Capillary: 228 mg/dL — ABNORMAL HIGH (ref 65–99)
Glucose-Capillary: 275 mg/dL — ABNORMAL HIGH (ref 65–99)
Glucose-Capillary: 280 mg/dL — ABNORMAL HIGH (ref 65–99)
Glucose-Capillary: 294 mg/dL — ABNORMAL HIGH (ref 65–99)
Glucose-Capillary: 298 mg/dL — ABNORMAL HIGH (ref 65–99)

## 2017-03-30 LAB — PROTIME-INR
INR: 4.55
Prothrombin Time: 42.8 seconds — ABNORMAL HIGH (ref 11.4–15.2)

## 2017-03-30 LAB — GROUP A STREP BY PCR: Group A Strep by PCR: NOT DETECTED

## 2017-03-30 LAB — APTT: aPTT: 52 seconds — ABNORMAL HIGH (ref 24–36)

## 2017-03-30 LAB — MRSA PCR SCREENING: MRSA by PCR: NEGATIVE

## 2017-03-30 SURGERY — INTUBATION-ENDOTRACHEAL WITH TRACHEOSTOMY STANDBY
Anesthesia: General | Wound class: Contaminated

## 2017-03-30 MED ORDER — LORAZEPAM 2 MG/ML IJ SOLN
INTRAMUSCULAR | Status: AC
  Filled 2017-03-30: qty 1

## 2017-03-30 MED ORDER — MIDAZOLAM HCL 2 MG/2ML IJ SOLN
INTRAMUSCULAR | Status: DC | PRN
  Administered 2017-03-30 (×2): 1 mg via INTRAVENOUS
  Administered 2017-03-30: 3 mg via INTRAVENOUS
  Administered 2017-03-30: 2 mg via INTRAVENOUS

## 2017-03-30 MED ORDER — FAMOTIDINE IN NACL 20-0.9 MG/50ML-% IV SOLN
20.0000 mg | INTRAVENOUS | Status: DC
  Administered 2017-03-30 – 2017-04-06 (×8): 20 mg via INTRAVENOUS
  Filled 2017-03-30 (×9): qty 50

## 2017-03-30 MED ORDER — LORAZEPAM 2 MG/ML IJ SOLN
INTRAMUSCULAR | Status: AC
  Administered 2017-03-30: 4 mg via INTRAVENOUS
  Filled 2017-03-30: qty 1

## 2017-03-30 MED ORDER — OXYMETAZOLINE HCL 0.05 % NA SOLN
NASAL | Status: AC
  Filled 2017-03-30: qty 15

## 2017-03-30 MED ORDER — FENTANYL 2500MCG IN NS 250ML (10MCG/ML) PREMIX INFUSION
0.0000 ug/h | INTRAVENOUS | Status: DC
  Administered 2017-03-30: 150 ug/h via INTRAVENOUS
  Administered 2017-03-30: 350 ug/h via INTRAVENOUS
  Administered 2017-03-31 – 2017-04-01 (×3): 300 ug/h via INTRAVENOUS
  Administered 2017-04-02: 275 ug/h via INTRAVENOUS
  Filled 2017-03-30 (×8): qty 250

## 2017-03-30 MED ORDER — PRO-STAT SUGAR FREE PO LIQD
60.0000 mL | Freq: Two times a day (BID) | ORAL | Status: DC
  Administered 2017-03-30 – 2017-04-01 (×6): 60 mL

## 2017-03-30 MED ORDER — RACEPINEPHRINE HCL 2.25 % IN NEBU
0.5000 mL | INHALATION_SOLUTION | Freq: Once | RESPIRATORY_TRACT | Status: AC
  Administered 2017-03-30: 0.5 mL via RESPIRATORY_TRACT
  Filled 2017-03-30: qty 0.5

## 2017-03-30 MED ORDER — MIDAZOLAM HCL 2 MG/2ML IJ SOLN
INTRAMUSCULAR | Status: AC
  Filled 2017-03-30: qty 2

## 2017-03-30 MED ORDER — MIDAZOLAM HCL 2 MG/2ML IJ SOLN
1.0000 mg | INTRAMUSCULAR | Status: DC | PRN
  Administered 2017-03-30: 2 mg via INTRAVENOUS

## 2017-03-30 MED ORDER — VITAL HIGH PROTEIN PO LIQD
1000.0000 mL | ORAL | Status: DC
  Administered 2017-03-30 – 2017-04-02 (×2): 1000 mL

## 2017-03-30 MED ORDER — VITAL HIGH PROTEIN PO LIQD: 1000.0000 mL | ORAL | Status: DC

## 2017-03-30 MED ORDER — PROPOFOL 10 MG/ML IV BOLUS
INTRAVENOUS | Status: AC
  Filled 2017-03-30: qty 20

## 2017-03-30 MED ORDER — SENNOSIDES 8.8 MG/5ML PO SYRP
5.0000 mL | ORAL_SOLUTION | Freq: Two times a day (BID) | ORAL | Status: DC
  Administered 2017-03-30 – 2017-04-01 (×6): 5 mL
  Filled 2017-03-30 (×11): qty 5

## 2017-03-30 MED ORDER — INSULIN ASPART 100 UNIT/ML ~~LOC~~ SOLN
0.0000 [IU] | SUBCUTANEOUS | Status: DC
  Administered 2017-03-30 (×3): 8 [IU] via SUBCUTANEOUS
  Administered 2017-03-31: 11 [IU] via SUBCUTANEOUS
  Administered 2017-03-31 (×2): 15 [IU] via SUBCUTANEOUS
  Administered 2017-03-31 (×2): 11 [IU] via SUBCUTANEOUS
  Filled 2017-03-30 (×8): qty 1

## 2017-03-30 MED ORDER — FENTANYL BOLUS VIA INFUSION
25.0000 ug | INTRAVENOUS | Status: DC | PRN
  Administered 2017-03-30: 25 ug via INTRAVENOUS
  Administered 2017-03-30: 50 ug via INTRAVENOUS
  Administered 2017-03-31 (×2): 25 ug via INTRAVENOUS
  Filled 2017-03-30: qty 50

## 2017-03-30 MED ORDER — DOCUSATE SODIUM 50 MG/5ML PO LIQD
100.0000 mg | Freq: Two times a day (BID) | ORAL | Status: DC
  Administered 2017-03-30 – 2017-04-01 (×6): 100 mg
  Filled 2017-03-30 (×6): qty 10

## 2017-03-30 MED ORDER — PIPERACILLIN-TAZOBACTAM 3.375 G IVPB
3.3750 g | Freq: Three times a day (TID) | INTRAVENOUS | Status: DC
  Administered 2017-03-30 – 2017-04-06 (×22): 3.375 g via INTRAVENOUS
  Filled 2017-03-30 (×22): qty 50

## 2017-03-30 MED ORDER — PIPERACILLIN-TAZOBACTAM 3.375 G IVPB 30 MIN
3.3750 g | Freq: Once | INTRAVENOUS | Status: AC
  Administered 2017-03-30: 3.375 g via INTRAVENOUS
  Filled 2017-03-30: qty 50

## 2017-03-30 MED ORDER — SUGAMMADEX SODIUM 200 MG/2ML IV SOLN: INTRAVENOUS | Status: DC | PRN

## 2017-03-30 MED ORDER — ONDANSETRON HCL 4 MG/2ML IJ SOLN: 4.0000 mg | Freq: Once | INTRAMUSCULAR | Status: AC

## 2017-03-30 MED ORDER — ASPIRIN 81 MG PO CHEW
324.0000 mg | CHEWABLE_TABLET | ORAL | Status: AC
  Administered 2017-03-30: 324 mg via ORAL
  Filled 2017-03-30: qty 4

## 2017-03-30 MED ORDER — LACTATED RINGERS IV SOLN
INTRAVENOUS | Status: DC | PRN
  Administered 2017-03-30: 09:00:00 via INTRAVENOUS

## 2017-03-30 MED ORDER — INSULIN ASPART 100 UNIT/ML ~~LOC~~ SOLN
3.0000 [IU] | SUBCUTANEOUS | Status: DC
  Administered 2017-03-30 – 2017-03-31 (×5): 3 [IU] via SUBCUTANEOUS
  Filled 2017-03-30 (×4): qty 1

## 2017-03-30 MED ORDER — SUCCINYLCHOLINE CHLORIDE 20 MG/ML IJ SOLN
INTRAMUSCULAR | Status: DC | PRN
  Administered 2017-03-30: 100 mg via INTRAVENOUS

## 2017-03-30 MED ORDER — SODIUM CHLORIDE 0.9 % IV BOLUS (SEPSIS): 1000.0000 mL | Freq: Once | INTRAVENOUS | Status: DC

## 2017-03-30 MED ORDER — ONDANSETRON HCL 4 MG/2ML IJ SOLN
INTRAMUSCULAR | Status: AC
  Filled 2017-03-30: qty 2

## 2017-03-30 MED ORDER — CHLORHEXIDINE GLUCONATE 0.12% ORAL RINSE (MEDLINE KIT)
15.0000 mL | Freq: Two times a day (BID) | OROMUCOSAL | Status: DC
  Administered 2017-03-30 – 2017-04-02 (×8): 15 mL via OROMUCOSAL

## 2017-03-30 MED ORDER — SODIUM CHLORIDE 0.9 % IV BOLUS (SEPSIS)
1000.0000 mL | Freq: Once | INTRAVENOUS | Status: AC
Start: 2017-03-30 — End: 2017-03-30
  Administered 2017-03-30: 1000 mL via INTRAVENOUS

## 2017-03-30 MED ORDER — DEXAMETHASONE SODIUM PHOSPHATE 4 MG/ML IJ SOLN
4.0000 mg | INTRAMUSCULAR | Status: DC
  Administered 2017-03-30 – 2017-03-31 (×7): 4 mg via INTRAVENOUS
  Filled 2017-03-30 (×7): qty 1

## 2017-03-30 MED ORDER — LIDOCAINE HCL 4 % EX SOLN
Freq: Once | CUTANEOUS | Status: AC
  Administered 2017-03-30: 08:00:00 via TOPICAL
  Filled 2017-03-30: qty 50

## 2017-03-30 MED ORDER — PROTHROMBIN COMPLEX CONC HUMAN 500 UNITS IV KIT
3500.0000 [IU] | PACK | Status: DC
  Filled 2017-03-30: qty 3500

## 2017-03-30 MED ORDER — ADULT MULTIVITAMIN LIQUID CH
15.0000 mL | Freq: Every day | ORAL | Status: DC
  Administered 2017-03-30 – 2017-04-01 (×3): 15 mL
  Filled 2017-03-30 (×4): qty 15

## 2017-03-30 MED ORDER — IOPAMIDOL (ISOVUE-300) INJECTION 61%
75.0000 mL | Freq: Once | INTRAVENOUS | Status: AC | PRN
  Administered 2017-03-30: 75 mL via INTRAVENOUS

## 2017-03-30 MED ORDER — SODIUM CHLORIDE 0.9 % IV SOLN: 250.0000 mL | INTRAVENOUS | Status: DC | PRN

## 2017-03-30 MED ORDER — INSULIN ASPART 100 UNIT/ML ~~LOC~~ SOLN
0.0000 [IU] | SUBCUTANEOUS | Status: DC
  Administered 2017-03-30: 3 [IU] via SUBCUTANEOUS
  Filled 2017-03-30: qty 1

## 2017-03-30 MED ORDER — ORAL CARE MOUTH RINSE
15.0000 mL | OROMUCOSAL | Status: DC
  Administered 2017-03-30 – 2017-04-03 (×40): 15 mL via OROMUCOSAL

## 2017-03-30 MED ORDER — ASPIRIN 300 MG RE SUPP: 300.0000 mg | RECTAL | Status: AC

## 2017-03-30 MED ORDER — PROPOFOL 10 MG/ML IV BOLUS
INTRAVENOUS | Status: DC | PRN
  Administered 2017-03-30: 150 mg via INTRAVENOUS

## 2017-03-30 MED ORDER — ONDANSETRON HCL 4 MG/2ML IJ SOLN
4.0000 mg | Freq: Once | INTRAMUSCULAR | Status: AC
Start: 2017-03-30 — End: 2017-03-30
  Administered 2017-03-30: 4 mg via INTRAVENOUS

## 2017-03-30 MED ORDER — MIDAZOLAM HCL 5 MG/5ML IJ SOLN
INTRAMUSCULAR | Status: AC
  Filled 2017-03-30: qty 5

## 2017-03-30 MED ORDER — VITAMIN K1 10 MG/ML IJ SOLN
10.0000 mg | INTRAVENOUS | Status: DC
  Filled 2017-03-30: qty 1

## 2017-03-30 MED ORDER — LORAZEPAM 2 MG/ML IJ SOLN
2.0000 mg | INTRAMUSCULAR | Status: DC | PRN
  Administered 2017-03-30: 4 mg via INTRAVENOUS
  Administered 2017-03-31 – 2017-04-01 (×4): 2 mg via INTRAVENOUS
  Administered 2017-04-01: 4 mg via INTRAVENOUS
  Administered 2017-04-01 (×2): 2 mg via INTRAVENOUS
  Filled 2017-03-30 (×5): qty 1
  Filled 2017-03-30: qty 2
  Filled 2017-03-30: qty 1
  Filled 2017-03-30: qty 2

## 2017-03-30 MED ORDER — DEXAMETHASONE SODIUM PHOSPHATE 4 MG/ML IJ SOLN
10.0000 mg | Freq: Once | INTRAMUSCULAR | Status: AC
  Administered 2017-03-30: 10 mg via INTRAVENOUS
  Filled 2017-03-30: qty 3

## 2017-03-30 MED ORDER — MIDAZOLAM HCL 2 MG/2ML IJ SOLN
INTRAMUSCULAR | Status: AC
  Administered 2017-03-30: 2 mg via INTRAVENOUS
  Filled 2017-03-30: qty 2

## 2017-03-30 SURGICAL SUPPLY — 31 items
BLADE SURG 15 STRL LF DISP TIS (BLADE) IMPLANT
BLADE SURG 15 STRL SS (BLADE)
BLADE SURG SZ11 CARB STEEL (BLADE) ×3 IMPLANT
CANISTER SUCT 1200ML W/VALVE (MISCELLANEOUS) IMPLANT
CORD BIP STRL DISP 12FT (MISCELLANEOUS) IMPLANT
ELECT REM PT RETURN 9FT ADLT (ELECTROSURGICAL)
ELECTRODE REM PT RTRN 9FT ADLT (ELECTROSURGICAL) IMPLANT
FORCEPS JEWEL BIP 4-3/4 STR (INSTRUMENTS) IMPLANT
GAUZE PACKING IODOFORM 1/2 (PACKING) IMPLANT
GLOVE BIO SURGEON STRL SZ7.5 (GLOVE) ×3 IMPLANT
GOWN STRL REUS W/ TWL LRG LVL3 (GOWN DISPOSABLE) ×2 IMPLANT
GOWN STRL REUS W/TWL LRG LVL3 (GOWN DISPOSABLE) ×1
HEMOSTAT SURGICEL 2X3 (HEMOSTASIS) IMPLANT
HLDR TRACH TUBE NECKBAND 18 (MISCELLANEOUS) IMPLANT
HOLDER TRACH TUBE NECKBAND 18 (MISCELLANEOUS)
KIT TURNOVER KIT A (KITS) ×3 IMPLANT
LABEL OR SOLS (LABEL) ×3 IMPLANT
NS IRRIG 500ML POUR BTL (IV SOLUTION) ×3 IMPLANT
PACK HEAD/NECK (MISCELLANEOUS) ×3 IMPLANT
SHEARS HARMONIC 9CM CVD (BLADE) IMPLANT
SPONGE DRAIN TRACH 4X4 STRL 2S (GAUZE/BANDAGES/DRESSINGS) IMPLANT
SPONGE KITTNER 5P (MISCELLANEOUS) IMPLANT
SUCTION FRAZIER HANDLE 10FR (MISCELLANEOUS) ×1
SUCTION TUBE FRAZIER 10FR DISP (MISCELLANEOUS) ×2 IMPLANT
SUT SILK 2 0 (SUTURE)
SUT SILK 2 0 SH (SUTURE) IMPLANT
SUT SILK 2-0 18XBRD TIE 12 (SUTURE) IMPLANT
SUT VIC AB 3-0 PS2 18 (SUTURE) IMPLANT
SYR 3ML LL SCALE MARK (SYRINGE) ×3 IMPLANT
TUBE TRACH SHILEY  6 DIST  CUF (TUBING) IMPLANT
TUBE TRACH SHILEY 8 DIST CUF (TUBING) IMPLANT

## 2017-03-30 NOTE — Transfer of Care (Signed)
Immediate Anesthesia Transfer of Care Note  Patient: Christian Berg  Procedure(s) Performed: [place] STANDBY  Patient Location: PACU and ICU  Anesthesia Type:General  Level of Consciousness: sedated and unresponsive  Airway & Oxygen Therapy: Patient remains intubated per anesthesia plan and Patient placed on Ventilator (see vital sign flow sheet for setting)  Post-op Assessment: Report given to RN and Post -op Vital signs reviewed and stable  Post vital signs: Reviewed and stable  Last Vitals:  Vitals:   03/30/17 0533 03/30/17 0632  BP: (!) 161/104 (!) 155/97  Pulse: (!) 122 (!) 112  Resp: (!) 22   Temp: 37 C   SpO2: 99% 99%    Last Pain:  Vitals:   03/30/17 0533  TempSrc: Oral  PainSc: 7          Complications: No apparent anesthesia complications

## 2017-03-30 NOTE — Anesthesia Preprocedure Evaluation (Signed)
Anesthesia Evaluation  Patient identified by MRN, date of birth, ID band Patient awake    Reviewed: Allergy & Precautions, H&P , NPO status , Patient's Chart, lab work & pertinent test results  History of Anesthesia Complications Negative for: history of anesthetic complications  Airway Mallampati: III  TM Distance: <3 FB Neck ROM: limited    Dental  (+) Chipped, Poor Dentition   Pulmonary shortness of breath, asthma ,           Cardiovascular Exercise Tolerance: Poor hypertension, (-) angina+ Peripheral Vascular Disease, +CHF and + PND  (-) Past MI + dysrhythmias Atrial Fibrillation + pacemaker      Neuro/Psych TIACVA negative psych ROS   GI/Hepatic negative GI ROS, (+) Hepatitis -  Endo/Other  diabetes, Type 2  Renal/GU Renal disease     Musculoskeletal  (+) Arthritis ,   Abdominal   Peds  Hematology negative hematology ROS (+)   Anesthesia Other Findings Past Medical History: No date: A-fib (HCC) No date: CHF (congestive heart failure) (HCC) No date: Degenerative disc disease, lumbar     Comment:  s/p injury No date: Diabetes mellitus without complication (HCC) No date: Disseminated superficial actinic porokeratosis No date: Dysrhythmia     Comment:  A-FIB, palpatations No date: Hypercholesteremia No date: Hypertension No date: Kidney stones No date: Peripheral vascular disease ([place])     Comment:  Carotid stenosis No date: Presence of permanent cardiac pacemaker     Comment:  Inactive 2011: TIA (transient ischemic attack)     Comment:  No deficits  Past Surgical History: 2004: APPENDECTOMY No date: CARDIAC CATHETERIZATION 2005: CARDIAC PACEMAKER PLACEMENT 06/14/2014: CATARACT EXTRACTION W/PHACO; Right     Comment:  Procedure: CATARACT EXTRACTION PHACO AND INTRAOCULAR               LENS PLACEMENT (IOC);  Surgeon: Leandrew Koyanagi, MD;               Location: [place];  Service:  Ophthalmology;                Laterality: Right; 08/09/2014: CYSTOSCOPY W/ RETROGRADES; Bilateral     Comment:  Procedure: CYSTOSCOPY WITH RETROGRADE PYELOGRAM;                Surgeon: Hollice Espy, MD;  Location: [facility];                Service: Urology;  Laterality: Bilateral;  BMI    Body Mass Index:  45.46 kg/m      Reproductive/Obstetrics negative OB ROS                             Anesthesia Physical Anesthesia Plan  ASA: V and emergent  Anesthesia Plan: General ETT   Post-op Pain Management:    Induction: Intravenous and Rapid sequence  PONV Risk Score and Plan:   Airway Management Planned: Oral ETT and Video Laryngoscope Planned  Additional Equipment:   Intra-op Plan:   Post-operative Plan: Post-operative intubation/ventilation  Informed Consent: I have reviewed the patients History and Physical, chart, labs and discussed the procedure including the risks, benefits and alternatives for the proposed anesthesia with the patient or authorized representative who has indicated his/her understanding and acceptance.   Dental Advisory Given  Plan Discussed with: Anesthesiologist, CRNA and Surgeon  Anesthesia Plan Comments: (Patient brought emergently to OR for epiglottitis.  Patient ventilating well and NPO appropriate.  Patient very anxious and would not  be a good candidate for awake intubation.  Air did pre op scope exam and reported that airway is mildly edematous, epiglottis normal.  Patient consented for risks of anesthesia including but not limited to:  - adverse reactions to medications - damage to teeth, lips or other oral mucosa - sore throat or hoarseness - Damage to heart, brain, lungs or loss of life  Patient voiced understanding.)        Anesthesia Quick Evaluation

## 2017-03-30 NOTE — Anesthesia Postprocedure Evaluation (Signed)
Anesthesia Post Note  Patient: Christian Berg  Procedure(s) Performed: [place] STANDBY  Patient location during evaluation: SICU Anesthesia Type: General Level of consciousness: sedated Pain management: pain level controlled Vital Signs Assessment: post-procedure vital signs reviewed and stable Respiratory status: patient remains intubated per anesthesia plan Cardiovascular status: stable Postop Assessment: no apparent nausea or vomiting Anesthetic complications: no     Last Vitals:  Vitals:   03/30/17 1100 03/30/17 1200  BP: (!) 114/102 96/71  Pulse: (!) 128 (!) 107  Resp: 20 15  Temp:  37 C  SpO2: 99% 98%    Last Pain:  Vitals:   03/30/17 1200  TempSrc: Oral  PainSc:                  Precious Haws Piscitello

## 2017-03-30 NOTE — ED Notes (Signed)
Pt to CT via stretcher with CT tech.

## 2017-03-30 NOTE — Op Note (Signed)
..  03/30/2017  9:36 AM    Jabier Mutton  [id number]   Pre-Op Dx:  Epiglottitis [J05.10]  Post-op Dx: Epiglottitis [J05.10]  Proc:Standby for emergent intubation  Surg: Micaiah Remillard  Anes:  GEN  EBL:  None  Comp:  None  Findings:  Significant supraglottic posterior edema and erythema, moderate left sided epiglottic edema  Procedure: With the patient in a comfortable supine position, general anesthesia was administered by Anesthesiologist with Glyde laryngoscope.  Findings are listed above.  Please see Anesthesia records for details.  Tracheostomy and adult bronchoscope was ready in case of need for surgical intervention for airway.  Following this  The patient was transferred to ICU in critical condition.    Plan: Will follow along with ICU for possible air leak/extubation.   Josey Dettmann 9:36 AM 03/30/2017

## 2017-03-30 NOTE — Consult Note (Signed)
Name: Christian Berg MRN: [id number] DOB: May 24, 1950     CONSULTATION DATE: 03/30/2017  REFERRING MD :  Pryor Ochoa  CHIEF COMPLAINT:  resp failure    HISTORY OF PRESENT ILLNESS:   67 y.o. male on Coumadin for chronic atrial fibrillation presents today with a sensation of swelling to his throat.   PER ER REPORT  He states around 1145 last night, after being his normal state of health he noticed there was some swelling to his throat.    That continued.  He states that he is able to breathe but it is assisted if he holds his head back, he also states that he cannot swallow his secretions.  He is spitting them out.  He denies any bleeding.  Has had no fever, no chills, no antecedent symptoms.  It does hurt to swallow.  Pain is again, worse when he swallows nothing makes it better aside from not swallowing, no other complaints.  No cough.  Patient seen by ENT in ER and was taken to OR for emergent intubation for severe epiglottitis. Patient now intubated and sedated On vent support    PAST MEDICAL HISTORY :   has a past medical history of A-fib ([place]), CHF (congestive heart failure) ([place]), Degenerative disc disease, lumbar, Diabetes mellitus without complication ([place]), Disseminated superficial actinic porokeratosis, Dysrhythmia, Hypercholesteremia, Hypertension, Kidney stones, Peripheral vascular disease ([place]), Presence of permanent cardiac pacemaker, and TIA (transient ischemic attack) (2011).  has a past surgical history that includes Appendectomy (2004); Cardiac catheterization; Cardiac pacemaker placement (2005); Cataract extraction w/PHACO (Right, 06/14/2014); and Cystoscopy w/ retrogrades (Bilateral, 08/09/2014). Prior to Admission medications   Medication Sig Start Date End Date Taking? Authorizing Provider  aspirin EC 81 MG tablet Take 81 mg by mouth every evening.     [provider]  cyclobenzaprine (FLEXERIL) 5 MG tablet cyclobenzaprine 5 mg tablet    [provider]  diltiazem (CARDIZEM) 60 MG tablet Take 1 tablet (60 mg total) by mouth every 8 (eight) hours. 09/19/15   Lisette Abu, PA-C  doxycycline (VIBRA-TABS) 100 MG tablet Take 1 tablet (100 mg total) by mouth 2 (two) times daily. 03/05/17   [name], PA  gabapentin (NEURONTIN) 300 MG capsule gabapentin 300 mg capsule  Take 1 PO QHS X 5D then 1PO BID X5D, 1 PO Q8    [provider]  glipiZIDE (GLUCOTROL) 10 MG tablet Take 1 tablet (10 mg total) by mouth 2 (two) times daily before a meal. Take 30 minutes before a meal 10/06/16   [name], PA  lansoprazole (PREVACID) 15 MG capsule Take 15-30 mg by mouth daily as needed. For heartburn/reflux    [provider]  lisinopril (PRINIVIL,ZESTRIL) 20 MG tablet TAKE ONE TABLET BY MOUTH ONCE DAILY 11/13/16   [name], PA  lovastatin (MEVACOR) 10 MG tablet Take 2 tablets (20 mg total) by mouth daily. Patient taking differently: Take 20 mg by mouth at bedtime.  08/08/16   [name], PA  metFORMIN (GLUCOPHAGE) 850 MG tablet TAKE 1 TABLET BY MOUTH TWICE DAILY WITH MEALS 02/02/17   [name], PA  metoprolol tartrate (LOPRESSOR) 100 MG tablet Take 1 tablet (100 mg total) by mouth at bedtime. Patient taking differently: Take 100 mg by mouth 2 (two) times daily.  10/24/16   Demetrios Loll, MD  metoprolol tartrate (LOPRESSOR) 50 MG tablet Take by mouth daily before breakfast.    [provider]  nystatin cream (MYCOSTATIN) Apply 1 application topically 2 (two) times daily. Patient  taking differently: Apply 1 application topically 2 (two) times daily as needed for dry skin.  07/17/14   Zara Council A, PA-C  oseltamivir (TAMIFLU) 75 MG capsule Take 1 capsule (75 mg total) by mouth 2 (two) times daily. 02/20/17   [name], PA  traMADol (ULTRAM-ER) 100 MG 24 hr tablet tramadol ER 100 mg tablet,extended release 24 hr    [provider]  traMADol-acetaminophen (ULTRACET) 37.5-325 MG tablet Take 2 tablets by  mouth 2 (two) times daily.    [provider]  traMADol-acetaminophen (ULTRACET) 37.5-325 MG tablet Take 2 tablets by mouth as needed. Daily with lunch as needed for severe pain    [provider]  traMADol-acetaminophen (ULTRACET) 37.5-325 MG tablet Ultracet 37.5 mg-325 mg tablet  up to 6 per day x 1 week, then up to 5 per day x 1 week, to continue taper. To fill 10-22-16.    [provider]  warfarin (COUMADIN) 7.5 MG tablet Take 1 tablet (7.5 mg total) by mouth daily. 08/08/16   [name], PA   Allergies  Allergen Reactions  . Clindamycin/Lincomycin     Chest discomfort  . Sulfa Antibiotics Hives  . Sulfasalazine Hives    FAMILY HISTORY:  family history includes Fibromyalgia in his sister; Heart disease in his mother; Hypertension in his daughter; Migraines in his daughter. He was adopted. SOCIAL HISTORY:  reports that  has never smoked. he has never used smokeless tobacco. He reports that he does not drink alcohol or use drugs.  REVIEW OF SYSTEMS:   Unable to obtain due to critical illness ALL OTHER ROS ARE NEGATIVE    VITAL SIGNS: Temp:  [98.6 F (37 C)] 98.6 F (37 C) (03/04 0533) Pulse Rate:  [112-122] 112 (03/04 0632) Resp:  [22] 22 (03/04 0533) BP: (155-161)/(97-104) 155/97 (03/04 0632) SpO2:  [99 %] 99 % (03/04 6237) Weight:  [299 lb (135.6 kg)] 299 lb (135.6 kg) (03/04 0534)  Physical Examination:  GENERAL:critically ill appearing, +resp distress HEAD: Normocephalic, atraumatic.  EYES: Pupils equal, round, reactive to light.  No scleral icterus.  MOUTH: Moist mucosal membrane. NECK: Supple. No thyromegaly. No nodules. No JVD.  PULMONARY: +rhonchi, +wheezing CARDIOVASCULAR: S1 and S2. Regular rate and rhythm. No murmurs, rubs, or gallops.  GASTROINTESTINAL: Soft, nontender, -distended. No masses. Positive bowel sounds. No hepatosplenomegaly.  MUSCULOSKELETAL: No swelling, clubbing, or edema.  NEUROLOGIC:  obtunded SKIN:intact,warm,dry       Recent Labs  Lab 03/30/17 0538  NA 139  K 4.0  CL 105  CO2 25  BUN 9  CREATININE 1.41*  GLUCOSE 205*   Recent Labs  Lab 03/30/17 0538  HGB 13.5  HCT 38.9*  WBC 7.6  PLT 192    ASSESSMENT / PLAN: 67 yo obese white male with multiple medical issues admitted to ICU for severe upper airway swelling and epiglottitis with acute upper airway obstruction now intubated  1.continue vent support 2.sedation as needed 3.IV abx 4.Iv steroids 5.follow up ENT recs  Will need to assess for cuff leaks and ENT suggestion for trials of extubation  Critical Care Time devoted to patient care services described in this note is 45 minutes.   Overall, patient is critically ill, prognosis is guarded.  Patient with Multiorgan failure and at high risk for cardiac arrest and death.    Corrin Parker, M.D.  [facility]  Medical Director [place] Director [facility] Cardio-Pulmonary Department

## 2017-03-30 NOTE — Progress Notes (Signed)
Inpatient Diabetes Program Recommendations  AACE/ADA: New Consensus Statement on Inpatient Glycemic Control (2015)  Target Ranges:  Prepandial:   less than 140 mg/dL      Peak postprandial:   less than 180 mg/dL (1-2 hours)      Critically ill patients:  140 - 180 mg/dL   Results for Wigfall, JEZIEL HOFFMANN "RAY" (MRN [id number]) as of 03/30/2017 10:07  Ref. Range 03/30/2017 05:38  Glucose Latest Ref Range: 65 - 99 mg/dL 205 (H)  Results for Freund, Traci R "RAY" (MRN [id number]) as of 03/30/2017 10:07  Ref. Range 02/02/2017 14:35  Hemoglobin A1C Unknown 8.2   Review of Glycemic Control  Diabetes history: DM2 Outpatient Diabetes medications: Glipizide 10 mg BID, Metformin 850 mg BID Current orders for Inpatient glycemic control: None  Inpatient Diabetes Program Recommendations:  Correction ([facility]): Please consider ordering CBGs with Novolog 0-15 units Q4H.  Thanks, Barnie Alderman, RN, MSN, CDE Diabetes Coordinator Inpatient Diabetes Program [phone] (Team Pager from 8am to 5pm)

## 2017-03-30 NOTE — ED Provider Notes (Signed)
[facility] Emergency Department Provider Note  ____________________________________________   I have reviewed the triage vital signs and the nursing notes. Where available I have reviewed prior notes and, if possible and indicated, outside hospital notes.    HISTORY  Chief Complaint Oral Swelling    HPI Christian Berg is a 67 y.o. male on Coumadin for chronic atrial fibrillation presents today with a sensation of swelling to his throat.  He states around 1145, after being his normal state of health he noticed there was some swelling to his throat.  That continued.  He states that he is able to breathe but it is assisted if he holds his head back, he also states that he cannot swallow his secretions.  He is spitting them out.  He denies any bleeding.  Has had no fever, no chills, no antecedent symptoms.  It does hurt to swallow.  Pain is again, worse when he swallows nothing makes it better aside from not swallowing, no other complaints.  No cough. Patient was triaged, CT scan was obtained prior to my arrival by antecedent physician, his steroids were already given at 6:06 AM.   Past Medical History:  Diagnosis Date  . A-fib ([place])   . CHF (congestive heart failure) ([place])   . Degenerative disc disease, lumbar    s/p injury  . Diabetes mellitus without complication ([place])   . Disseminated superficial actinic porokeratosis   . Dysrhythmia    A-FIB, palpatations  . Hypercholesteremia   . Hypertension   . Kidney stones   . Peripheral vascular disease ([place])    Carotid stenosis  . Presence of permanent cardiac pacemaker    Inactive  . TIA (transient ischemic attack) 2011   No deficits    Patient Active Problem List   Diagnosis Date Noted  . Hypoalbuminemia 10/22/2015  . NASH (nonalcoholic steatohepatitis) 10/22/2015  . Closed fracture of proximal end of left humerus with delayed healing 10/17/2015  . Ischemic embolic stroke ([place]) 16/94/5038  . Peripheral  vascular disease ([place]) 12/15/2014  . Morbid (severe) obesity due to excess calories ([place]) 11/22/2014  . VFD (visual field defect) 11/22/2014  . History of TIA (transient ischemic attack) 10/31/2014  . Kidney stones 07/24/2014  . Other cirrhosis of liver ([place]) 07/24/2014  . DSAP (disseminated superficial actinic porokeratosis) 07/17/2014  . Asthma 05/31/2014  . Chronic diastolic heart failure ([place]) 05/31/2014  . Diabetes mellitus ([place]) 05/31/2014  . Hypercholesteremia 05/31/2014  . B12 deficiency 05/31/2014  . Carotid artery narrowing 02/15/2014  . Bilateral carotid artery stenosis 02/15/2014  . Chronic atrial fibrillation ([place]) 10/24/2013  . Benign prostatic hyperplasia with urinary obstruction 09/06/2012  . Heart disease 05/29/2009  . Benign essential HTN 09/29/2008  . Narrowing of intervertebral disc space 01/11/2007    Past Surgical History:  Procedure Laterality Date  . APPENDECTOMY  2004  . CARDIAC CATHETERIZATION    . CARDIAC PACEMAKER PLACEMENT  2005  . CATARACT EXTRACTION W/PHACO Right 06/14/2014   Procedure: CATARACT EXTRACTION PHACO AND INTRAOCULAR LENS PLACEMENT (IOC);  Surgeon: Leandrew Koyanagi, MD;  Location: [facility];  Service: Ophthalmology;  Laterality: Right;  . CYSTOSCOPY W/ RETROGRADES Bilateral 08/09/2014   Procedure: CYSTOSCOPY WITH RETROGRADE PYELOGRAM;  Surgeon: Hollice Espy, MD;  Location: [facility];  Service: Urology;  Laterality: Bilateral;    Prior to Admission medications   Medication Sig Start Date End Date Taking? Authorizing Provider  aspirin EC 81 MG tablet Take 81 mg by mouth every evening.     [provider]  cyclobenzaprine (FLEXERIL) 5 MG tablet cyclobenzaprine 5 mg tablet    [provider]  diltiazem (CARDIZEM) 60 MG tablet Take 1 tablet (60 mg total) by mouth every 8 (eight) hours. 09/19/15   Lisette Abu, PA-C  doxycycline (VIBRA-TABS) 100 MG tablet Take 1 tablet (100 mg total) by mouth 2 (two) times  daily. 03/05/17   [name], PA  gabapentin (NEURONTIN) 300 MG capsule gabapentin 300 mg capsule  Take 1 PO QHS X 5D then 1PO BID X5D, 1 PO Q8    [provider]  glipiZIDE (GLUCOTROL) 10 MG tablet Take 1 tablet (10 mg total) by mouth 2 (two) times daily before a meal. Take 30 minutes before a meal 10/06/16   [name], PA  lansoprazole (PREVACID) 15 MG capsule Take 15-30 mg by mouth daily as needed. For heartburn/reflux    [provider]  lisinopril (PRINIVIL,ZESTRIL) 20 MG tablet TAKE ONE TABLET BY MOUTH ONCE DAILY 11/13/16   [name], PA  lovastatin (MEVACOR) 10 MG tablet Take 2 tablets (20 mg total) by mouth daily. Patient taking differently: Take 20 mg by mouth at bedtime.  08/08/16   [name], PA  metFORMIN (GLUCOPHAGE) 850 MG tablet TAKE 1 TABLET BY MOUTH TWICE DAILY WITH MEALS 02/02/17   [name], PA  metoprolol tartrate (LOPRESSOR) 100 MG tablet Take 1 tablet (100 mg total) by mouth at bedtime. Patient taking differently: Take 100 mg by mouth 2 (two) times daily.  10/24/16   Demetrios Loll, MD  metoprolol tartrate (LOPRESSOR) 50 MG tablet Take by mouth daily before breakfast.    [provider]  nystatin cream (MYCOSTATIN) Apply 1 application topically 2 (two) times daily. Patient taking differently: Apply 1 application topically 2 (two) times daily as needed for dry skin.  07/17/14   Zara Council A, PA-C  oseltamivir (TAMIFLU) 75 MG capsule Take 1 capsule (75 mg total) by mouth 2 (two) times daily. 02/20/17   [name], PA  traMADol (ULTRAM-ER) 100 MG 24 hr tablet tramadol ER 100 mg tablet,extended release 24 hr    [provider]  traMADol-acetaminophen (ULTRACET) 37.5-325 MG tablet Take 2 tablets by mouth 2 (two) times daily.    [provider]  traMADol-acetaminophen (ULTRACET) 37.5-325 MG tablet Take 2 tablets by mouth as needed. Daily with lunch as needed for severe pain    [provider]   traMADol-acetaminophen (ULTRACET) 37.5-325 MG tablet Ultracet 37.5 mg-325 mg tablet  up to 6 per day x 1 week, then up to 5 per day x 1 week, to continue taper. To fill 10-22-16.    [provider]  warfarin (COUMADIN) 7.5 MG tablet Take 1 tablet (7.5 mg total) by mouth daily. 08/08/16   [name], PA    Allergies Clindamycin/lincomycin; Sulfa antibiotics; and Sulfasalazine  Family History  Adopted: Yes  Problem Relation Age of Onset  . Heart disease Mother   . Fibromyalgia Sister   . Hypertension Daughter   . Migraines Daughter   . Kidney disease Neg Hx   . Prostate cancer Neg Hx   . Bladder Cancer Neg Hx     Social History Social History   Tobacco Use  . Smoking status: Never Smoker  . Smokeless tobacco: Never Used  Substance Use Topics  . Alcohol use: No    Alcohol/week: 0.0 oz  . Drug use: No    Review of Systems Constitutional: No fever/chills Eyes: No visual changes. ENT: + sore throat. No stiff neck no  neck pain Cardiovascular: Denies chest pain. Respiratory: Denies shortness of breath. Gastrointestinal:   no vomiting.  No diarrhea.  No constipation. Genitourinary: Negative for dysuria. Musculoskeletal: Negative lower extremity swelling Skin: Negative for rash. Neurological: Negative for severe headaches, focal weakness or numbness.   ____________________________________________   PHYSICAL EXAM:  VITAL SIGNS: ED Triage Vitals  Enc Vitals Group     BP 03/30/17 0533 (!) 161/104     Pulse Rate 03/30/17 0533 (!) 122     Resp 03/30/17 0533 (!) 22     Temp 03/30/17 0533 98.6 F (37 C)     Temp Source 03/30/17 0533 Oral     SpO2 03/30/17 0533 99 %     Weight 03/30/17 0534 299 lb (135.6 kg)     Height 03/30/17 0534 5' 8"  (1.727 m)     Head Circumference --      Peak Flow --      Pain Score 03/30/17 0533 7     Pain Loc --      Pain Edu? --      Excl. in [place]? --     Constitutional: Alert and oriented.  Chronically ill-appearing, but  nontoxic at this moment Eyes: Conjunctivae are normal Head: Atraumatic HEENT: No congestion/rhinnorhea. Mucous membranes are moist.  Oropharynx non-erythematous, no upper airway issues noted on exam. Neck:   Mild anterior tenderness with no significant masses morbid obesity noted with no meningismus, no masses, no stridor voice is very slightly hoarse no increased work of breathing Cardiovascular: Tachycardia irregularly irregular. Grossly normal heart sounds.  Good peripheral circulation. Respiratory: Normal respiratory effort.  No retractions. Lungs CTAB. Abdominal: Soft and nontender. No distention. No guarding no rebound Back:  There is no focal tenderness or step off.  there is no midline tenderness there are no lesions noted. there is no CVA tenderness Musculoskeletal: No lower extremity tenderness, no upper extremity tenderness. No joint effusions, no DVT signs strong distal pulses no edema Neurologic:  Normal speech and language. No gross focal neurologic deficits are appreciated.  Skin:  Skin is warm, dry and intact. No rash noted. Psychiatric: Mood and affect are normal. Speech and behavior are normal.  ____________________________________________   LABS (all labs ordered are listed, but only abnormal results are displayed)  Labs Reviewed  COMPREHENSIVE METABOLIC PANEL - Abnormal; Notable for the following components:      Result Value   Glucose, Bld 205 (*)    Creatinine, Ser 1.41 (*)    Albumin 3.2 (*)    AST 45 (*)    GFR calc non Af Amer 50 (*)    GFR calc Af Amer 58 (*)    All other components within normal limits  CBC WITH DIFFERENTIAL/PLATELET - Abnormal; Notable for the following components:   RBC 3.90 (*)    HCT 38.9 (*)    MCH 34.7 (*)    All other components within normal limits  GROUP A STREP BY PCR  PROTIME-INR    Pertinent labs  results that were available during my care of the patient were reviewed by me and considered in my medical decision making (see  chart for details). ____________________________________________  EKG  I personally interpreted any EKGs ordered by me or triage  ____________________________________________  RADIOLOGY  Pertinent labs & imaging results that were available during my care of the patient were reviewed by me and considered in my medical decision making (see chart for details). If possible, patient and/or family made aware of any abnormal findings.  No  results found. ____________________________________________    PROCEDURES  Procedure(s) performed: None  Procedures  Critical Care performed: CRITICAL CARE Performed by: Schuyler Amor   Total critical care time: 65 minutes  Critical care time was exclusive of separately billable procedures and treating other patients.  Critical care was necessary to treat or prevent imminent or life-threatening deterioration.  Critical care was time spent personally by me on the following activities: development of treatment plan with patient and/or surrogate as well as nursing, discussions with consultants, evaluation of patient's response to treatment, examination of patient, obtaining history from patient or surrogate, ordering and performing treatments and interventions, ordering and review of laboratory studies, ordering and review of radiographic studies, pulse oximetry and re-evaluation of patient's condition.   ____________________________________________   INITIAL IMPRESSION / ASSESSMENT AND PLAN / ED COURSE  Pertinent labs & imaging results that were available during my care of the patient were reviewed by me and considered in my medical decision making (see chart for details).   ----------------------------------------- 7:18 AM on 03/30/2017 -----------------------------------------  Have d/w Radiologist personally, Paging ENT. Giving zosyn and has had decadron getting 2 ivs.   ----------------------------------------- 7:23 AM on  03/30/2017 -----------------------------------------  D/w dr. Ladene Artist concern for epiglottitis, he is going to have his partner come directly down. I am giving racemic epi and we have difficult airway equipment in the room.   ----------------------------------------- 7:32 AM on 03/30/2017 -----------------------------------------  Dr. Pryor Ochoa is coming in, aware of findings. I very much appreciate ENT support.   ----------------------------------------- 7:35 AM on 03/30/2017 -----------------------------------------  After Epi, pt states his swelling is improved.  ----------------------------------------- 7:58 AM on 03/30/2017 -----------------------------------------  ENT was able to visualize the patient's airway there is some swelling but at this time does not appear to have imminent airway collapse, and they feel that it would be safer for the patient to receive definitive airway management in the operating room.  We are arranging therefore stat transfer to the OR.  INR noted.  ----------------------------------------- 8:15 AM on 03/30/2017 -----------------------------------------  Patient to OR as soon as possible,.  Monitoring closely here.          ____________________________________________   FINAL CLINICAL IMPRESSION(S) / ED DIAGNOSES  Final diagnoses:  None      This chart was dictated using voice recognition software.  Despite best efforts to proofread,  errors can occur which can change meaning.      Schuyler Amor, MD 03/30/17 [phone]

## 2017-03-30 NOTE — Anesthesia Procedure Notes (Signed)
Procedure Name: Intubation Date/Time: 03/30/2017 8:32 AM Performed by: Andria Frames, MD Pre-anesthesia Checklist: Patient identified, Patient being monitored, Timeout performed, Emergency Drugs available and Suction available Patient Re-evaluated:Patient Re-evaluated prior to induction Oxygen Delivery Method: Circle system utilized Preoxygenation: Pre-oxygenation with 100% oxygen Induction Type: IV induction and Rapid sequence Ventilation: Mask ventilation without difficulty Laryngoscope Size: 3 and Glidescope Grade View: Grade I Tube type: Oral Tube size: 7.0 mm Number of attempts: 1 Airway Equipment and Method: Stylet Placement Confirmation: ETT inserted through vocal cords under direct vision,  positive ETCO2 and breath sounds checked- equal and bilateral Secured at: 22 cm Tube secured with: Tape Dental Injury: Teeth and Oropharynx as per pre-operative assessment  Difficulty Due To: Difficulty was anticipated, Difficult Airway- due to large tongue, Difficult Airway- due to reduced neck mobility and Difficult Airway-  due to edematous airway Future Recommendations: Recommend- induction with short-acting agent, and alternative techniques readily available

## 2017-03-30 NOTE — ED Notes (Signed)
ENT at bedside

## 2017-03-30 NOTE — ED Triage Notes (Addendum)
Pt says around 1145 pt he started having a sore throat; now pt says it feels like his throat is closing; unable to swallow his saliva; voice hoarse; pt says he can breathe better with his head tilted back;

## 2017-03-30 NOTE — Op Note (Signed)
Christian Berg, Christian Berg [id number] [birth year]  [name]  Reason for Consult: airway distress, epiglottitis  HPI: 67 y.o. Obese male presented to ED with acute onset of throat pain and difficulty breathing/tolerating secretions.  Presented to ED and CT scan obtained.  This showed epiglottic/supraglottic narrowing.  Patient reports increase in difficulty.  Given abx, racemic epinephrine in ER, and steroids.  Patient denies any improvement.  Call emergently for airway evaluation.  Allergies:  Allergies  Allergen Reactions  . Clindamycin/Lincomycin     Chest discomfort  . Sulfa Antibiotics Hives  . Sulfasalazine Hives    ROS: Review of systems normal other than 12 systems except per HPI.  PMH:  Past Medical History:  Diagnosis Date  . A-fib ([place])   . CHF (congestive heart failure) ([place])   . Degenerative disc disease, lumbar    s/p injury  . Diabetes mellitus without complication ([place])   . Disseminated superficial actinic porokeratosis   . Dysrhythmia    A-FIB, palpatations  . Hypercholesteremia   . Hypertension   . Kidney stones   . Peripheral vascular disease ([place])    Carotid stenosis  . Presence of permanent cardiac pacemaker    Inactive  . TIA (transient ischemic attack) 2011   No deficits    FH:  Family History  Adopted: Yes  Problem Relation Age of Onset  . Heart disease Mother   . Fibromyalgia Sister   . Hypertension Daughter   . Migraines Daughter   . Kidney disease Neg Hx   . Prostate cancer Neg Hx   . Bladder Cancer Neg Hx     SH:  Social History   Socioeconomic History  . Marital status: Married    Spouse name: Not on file  . Number of children: Not on file  . Years of education: Not on file  . Highest education level: Not on file  Social Needs  . Financial resource strain: Not on file  . Food insecurity - worry: Not on file  . Food insecurity - inability: Not on file  . Transportation needs - medical: Not on file  . Transportation needs -  non-medical: Not on file  Occupational History  . Not on file  Tobacco Use  . Smoking status: Never Smoker  . Smokeless tobacco: Never Used  Substance and Sexual Activity  . Alcohol use: No    Alcohol/week: 0.0 oz  . Drug use: No  . Sexual activity: Not on file  Other Topics Concern  . Not on file  Social History Narrative   Lives at home with wife. Independent at baseline    PSH:  Past Surgical History:  Procedure Laterality Date  . APPENDECTOMY  2004  . CARDIAC CATHETERIZATION    . CARDIAC PACEMAKER PLACEMENT  2005  . CATARACT EXTRACTION W/PHACO Right 06/14/2014   Procedure: CATARACT EXTRACTION PHACO AND INTRAOCULAR LENS PLACEMENT (IOC);  Surgeon: Leandrew Koyanagi, MD;  Location: [facility];  Service: Ophthalmology;  Laterality: Right;  . CYSTOSCOPY W/ RETROGRADES Bilateral 08/09/2014   Procedure: CYSTOSCOPY WITH RETROGRADE PYELOGRAM;  Surgeon: Hollice Espy, MD;  Location: [facility];  Service: Urology;  Laterality: Bilateral;    Physical  Exam:  GEN-  Male sitting upright in bed with difficulty tolerating secretions.  Phonating but hoarse NOSE-  Left septal deviation, no masses or lesions OC/OP-  No swelling NECK-  Shortened neck with limited landmarks, painful with palpation of thyroid cartilage. RESP-  Tachypneic, labored breathing CARD-  Irregular rate  Laryngoscope-  Emergent laryngoscopy was performed with  Afrin/Liodcaine anesthesia.  This demonstrated significant posterior supraglottic edema and erythema prolapsing into airway.  Moderate left sided epiglottic edema.  Anterior vocal cords visualized with phonation.  A/P: Epiglottitis/supraglottitis  Plan:  To OR for emergent intubation due to impending airway compromise.  Risks and benefits discussed with patient and family.   Christian Berg 03/30/2017 9:29 AM

## 2017-03-30 NOTE — Anesthesia Post-op Follow-up Note (Signed)
Anesthesia QCDR form completed.        

## 2017-03-30 NOTE — Progress Notes (Addendum)
Initial Nutrition Assessment  DOCUMENTATION CODES:   Morbid obesity  INTERVENTION:  Initiate Vital High Protein at 60 mL/hr (1440 mL goal daily volume) + Pro-Stat 60 mL BID via NGT. Provides 1840 kcal, 186 grams of protein, 1210 mL H2O daily.  Provide liquid MVI daily per tube.  NUTRITION DIAGNOSIS:   Inadequate oral intake related to inability to eat as evidenced by NPO status.  GOAL:   Provide needs based on ASPEN/SCCM guidelines  MONITOR:   Vent status, Labs, Weight trends, TF tolerance, Skin, I & O's  REASON FOR ASSESSMENT:   Ventilator    ASSESSMENT:   67 year old male with PMHx of HTN, PVD, CHF, hx TIA, hypercholesteremia, degenerative disc disease, DM type 2, A-fib, s/p cardiac pacemaker placement who presented with throat pain and swelling, began having difficulty breathing and inability to swallow secretions found to have severe epiglottitis requiring emergent intubation 3/4.   Patient intubated and sedated. Wife reports patient's UBW was 294 lbs PTA. Another family member arrived and began speaking with patient's wife, so unable to get any further nutrition history. Abdomen soft on NFPE.  Access: NGT placed 3/4; terminates in proximal stomach per abdominal x-ray 3/4  MAP: 60-115 mmHg; not on any pressors  Patient is currently intubated on ventilator support MV: 7.3 L/min Temp (24hrs), Avg:98.6 F (37 C), Min:98.6 F (37 C), Max:98.6 F (37 C)  Propofol: N/A  Medications reviewed and include: Decadron 4 mg Q4hrs IV, Senokot 5 mL BID per tube, Colace 100 mg BID per tube, famotidine, fentanyl gtt, Zosyn.  Labs reviewed: CBG 228-294, Creatinine 1.41.  I/O: 200 mL UOP so far today  Patient does not meet criteria for malnutrition, but is at risk for malnutrition in setting of pressure ulcer on coccyx.  Discussed with RN and on rounds. Plan is to start tube feeds today.  NUTRITION - FOCUSED PHYSICAL EXAM:    Most Recent Value  Orbital Region  No  depletion  Upper Arm Region  No depletion  Thoracic and Lumbar Region  No depletion  Buccal Region  Unable to assess  Temple Region  No depletion  Clavicle Bone Region  No depletion  Clavicle and Acromion Bone Region  No depletion  Scapular Bone Region  Unable to assess  Dorsal Hand  No depletion  Patellar Region  No depletion  Anterior Thigh Region  No depletion  Posterior Calf Region  No depletion  Edema (RD Assessment)  Mild  Hair  Reviewed  Eyes  Unable to assess  Mouth  Unable to assess  Skin  Reviewed  Nails  Reviewed     Diet Order:  Diet NPO time specified  EDUCATION NEEDS:   No education needs have been identified at this time  Skin:  Skin Assessment: Skin Integrity Issues: Skin Integrity Issues:: Unstageable, Other (Comment) Unstageable: coccyx Other: MSAD to bilateral groin  Last BM:  Unknown  Height:   Ht Readings from Last 1 Encounters:  03/30/17 5' 8"  (1.727 m)    Weight:   Wt Readings from Last 1 Encounters:  03/30/17 299 lb (135.6 kg)    Ideal Body Weight:  70 kg  BMI:  Body mass index is 45.46 kg/m.  Estimated Nutritional Needs:   Kcal:  0177-9390 (11-14 kcal/kg)  Protein:  >/= 175 grams (>/= 2.5 grams/kg IBW)  Fluid:  1.75 L/day (25 mL/kg IBW)  Willey Blade, MS, RD, LDN Office: [phone] Pager: [phone] After Hours/Weekend Pager: [phone]

## 2017-03-30 NOTE — Progress Notes (Signed)
Pt Map of 63 at 17:00. MD notified. 1L NS bolus ordered.

## 2017-03-30 NOTE — Progress Notes (Signed)
[facility] for Warfarin Indication: atrial fibrillation  Allergies  Allergen Reactions  . Clindamycin/Lincomycin     Chest discomfort  . Sulfa Antibiotics Hives  . Sulfasalazine Hives    Patient Measurements: Height: 5' 8"  (172.7 cm) Weight: 299 lb (135.6 kg) IBW/kg (Calculated) : 68.4   Vital Signs: Temp: 98.6 F (37 C) (03/04 1200) Temp Source: Oral (03/04 1200) BP: 88/76 (03/04 1400) Pulse Rate: 98 (03/04 1400)  Labs: Recent Labs    03/30/17 0538 03/30/17 0709  HGB 13.5  --   HCT 38.9*  --   PLT 192  --   APTT  --  52*  LABPROT  --  42.8*  INR  --  4.55*  CREATININE 1.41*  --     Estimated Creatinine Clearance: 67.6 mL/min (A) (by C-G formula based on SCr of 1.41 mg/dL (H)).   Medical History: Past Medical History:  Diagnosis Date  . A-fib ([place])   . CHF (congestive heart failure) ([place])   . Degenerative disc disease, lumbar    s/p injury  . Diabetes mellitus without complication ([place])   . Disseminated superficial actinic porokeratosis   . Dysrhythmia    A-FIB, palpatations  . Hypercholesteremia   . Hypertension   . Kidney stones   . Peripheral vascular disease ([place])    Carotid stenosis  . Presence of permanent cardiac pacemaker    Inactive  . TIA (transient ischemic attack) 2011   No deficits    Assessment: 67 y/o M with a h/o atrial fibrillation on warfarin 7.5 mg daily PTA admitted with epiglottitis and intubated. Vitamin K and KCentra ordered with elevated INR for possible trach but not administered.   Goal of Therapy:  INR 2-3   Plan:  Will f/u AM INR.   Ulice Dash D 03/30/2017,3:36 PM

## 2017-03-31 LAB — PROTIME-INR
INR: 3.16
Prothrombin Time: 32.2 seconds — ABNORMAL HIGH (ref 11.4–15.2)

## 2017-03-31 LAB — BASIC METABOLIC PANEL
Anion gap: 6 (ref 5–15)
BUN: 19 mg/dL (ref 6–20)
CO2: 23 mmol/L (ref 22–32)
Calcium: 8 mg/dL — ABNORMAL LOW (ref 8.9–10.3)
Chloride: 106 mmol/L (ref 101–111)
Creatinine, Ser: 1.73 mg/dL — ABNORMAL HIGH (ref 0.61–1.24)
GFR calc Af Amer: 45 mL/min — ABNORMAL LOW (ref >60)
GFR calc non Af Amer: 39 mL/min — ABNORMAL LOW (ref >60)
Glucose, Bld: 339 mg/dL — ABNORMAL HIGH (ref 65–99)
Potassium: 4.5 mmol/L (ref 3.5–5.1)
Sodium: 135 mmol/L (ref 135–145)

## 2017-03-31 LAB — GLUCOSE, CAPILLARY
Glucose-Capillary: 316 mg/dL — ABNORMAL HIGH (ref 65–99)
Glucose-Capillary: 302 mg/dL — ABNORMAL HIGH (ref 65–99)
Glucose-Capillary: 397 mg/dL — ABNORMAL HIGH (ref 65–99)
Glucose-Capillary: 370 mg/dL — ABNORMAL HIGH (ref 65–99)
Glucose-Capillary: 350 mg/dL — ABNORMAL HIGH (ref 65–99)
Glucose-Capillary: 348 mg/dL — ABNORMAL HIGH (ref 65–99)

## 2017-03-31 MED ORDER — DEXAMETHASONE SODIUM PHOSPHATE 4 MG/ML IJ SOLN
4.0000 mg | Freq: Three times a day (TID) | INTRAMUSCULAR | Status: DC
  Administered 2017-03-31 – 2017-04-02 (×7): 4 mg via INTRAVENOUS
  Filled 2017-03-31 (×10): qty 1

## 2017-03-31 MED ORDER — INSULIN ASPART 100 UNIT/ML ~~LOC~~ SOLN
5.0000 [IU] | SUBCUTANEOUS | Status: DC
  Administered 2017-03-31 (×3): 5 [IU] via SUBCUTANEOUS
  Filled 2017-03-31 (×3): qty 1

## 2017-03-31 MED ORDER — INSULIN GLARGINE 100 UNIT/ML ~~LOC~~ SOLN
10.0000 [IU] | Freq: Every day | SUBCUTANEOUS | Status: DC
  Administered 2017-03-31: 10 [IU] via SUBCUTANEOUS
  Filled 2017-03-31 (×2): qty 0.1

## 2017-03-31 MED ORDER — SODIUM CHLORIDE 0.9 % IV SOLN
INTRAVENOUS | Status: DC
  Administered 2017-03-31: 2.9 [IU]/h via INTRAVENOUS
  Administered 2017-04-01: 15.3 [IU]/h via INTRAVENOUS
  Administered 2017-04-01: 15.1 [IU]/h via INTRAVENOUS
  Administered 2017-04-02: 19.7 [IU]/h via INTRAVENOUS
  Administered 2017-04-02: 21.9 [IU]/h via INTRAVENOUS
  Filled 2017-03-31 (×6): qty 1

## 2017-03-31 NOTE — Consult Note (Signed)
   Name: Christian Berg MRN: [id number] DOB: 07/13/1950     CONSULTATION DATE: 03/30/2017  REFERRING MD :  Pryor Ochoa  CHIEF COMPLAINT:  resp failure    HISTORY OF PRESENT ILLNESS:   emergent intubation for severe epiglottitis. Patient now intubated and sedated On vent support Remains critically ill Follow up ENT recs   REVIEW OF SYSTEMS:   Unable to obtain due to critical illness ALL OTHER ROS ARE NEGATIVE    VITAL SIGNS: Temp:  [97.7 F (36.5 C)-99.8 F (37.7 C)] 97.7 F (36.5 C) (03/05 0800) Pulse Rate:  [91-128] 99 (03/05 0600) Resp:  [15-20] 17 (03/05 0600) BP: (76-150)/(41-102) 106/77 (03/05 0600) SpO2:  [94 %-100 %] 97 % (03/05 0600) FiO2 (%):  [35 %-40 %] 35 % (03/05 0800) Weight:  [296 lb 1.2 oz (134.3 kg)] 296 lb 1.2 oz (134.3 kg) (03/05 0453)  Physical Examination:  GENERAL:critically ill appearing, on vent HEAD: Normocephalic, atraumatic.  EYES: Pupils equal, round, reactive to light.  No scleral icterus.  MOUTH: Moist mucosal membrane. NECK: Supple. No thyromegaly. No nodules. No JVD.  PULMONARY: +rhonchi, +wheezing CARDIOVASCULAR: S1 and S2. Regular rate and rhythm. No murmurs, rubs, or gallops.  GASTROINTESTINAL: Soft, nontender, -distended. No masses. Positive bowel sounds. No hepatosplenomegaly.  MUSCULOSKELETAL: No swelling, clubbing, or edema.  NEUROLOGIC: obtunded SKIN:intact,warm,dry       Recent Labs  Lab 03/30/17 0538 03/31/17 0406  NA 139 135  K 4.0 4.5  CL 105 106  CO2 25 23  BUN 9 19  CREATININE 1.41* 1.73*  GLUCOSE 205* 339*   Recent Labs  Lab 03/30/17 0538  HGB 13.5  HCT 38.9*  WBC 7.6  PLT 192    ASSESSMENT / PLAN: 67 yo obese white male with multiple medical issues admitted to ICU for severe upper airway swelling and epiglottitis with acute upper airway obstruction now intubated for severe resp failure  1.continue vent support 2.sedation as needed 3.IV abx 4.Iv steroids 5.follow up ENT recs  Will need to  assess for cuff leaks and ENT suggestion for trials of extubation  Critical Care Time devoted to patient care services described in this note is 34 minutes.   Overall, patient is critically ill, prognosis is guarded.  Patient with Multiorgan failure and at high risk for cardiac arrest and death.    Corrin Parker, M.D.  [facility]  Medical Director [place] Director [facility] Cardio-Pulmonary Department

## 2017-03-31 NOTE — Progress Notes (Signed)
This RN took over care of patient after 4pm, patient continues on ventilator, sedated with fentanyl.  Vital signs stable. No sign of discomfort noted.

## 2017-03-31 NOTE — Progress Notes (Signed)
Inpatient Diabetes Program Recommendations  AACE/ADA: New Consensus Statement on Inpatient Glycemic Control (2015)  Target Ranges:  Prepandial:   less than 140 mg/dL      Peak postprandial:   less than 180 mg/dL (1-2 hours)      Critically ill patients:  140 - 180 mg/dL  Results for Christian Berg, Christian JILEK "RAY" (MRN [id number]) as of 03/31/2017 08:33  Ref. Range 03/30/2017 08:52 03/30/2017 12:09 03/30/2017 16:13 03/30/2017 19:34 03/30/2017 23:49 03/31/2017 04:09 03/31/2017 07:50  Glucose-Capillary Latest Ref Range: 65 - 99 mg/dL 228 (H) 294 (H)  Novolog 3 units 280 (H)  Novolog 11 units 298 (H)  Novolog11 units 275 (H)  Novolog 11 units 316 (H)  Novolog 14 units 302 (H)  Novolog 14 units    Review of Glycemic Control  Diabetes history: DM2 Outpatient Diabetes medications: Glipizide 10 mg BID, Metformin 850 mg BID Current orders for Inpatient glycemic control: Lantus 10 units daily, Novolog 0-15 units Q4H, Novolog 3 units Q4H; Decadron 4 mg Q4H  Inpatient Diabetes Program Recommendations:  Insulin: Noted Lantus order this morning and glucose has ranged from 228-316 mg/dl over the past 24 hours. Would recommend using ICU Glycemic control Phase 2 (IV insulin) and discontinue all other insulin orders at this time to improve inpatient glycemic control.  Thanks, Barnie Alderman, RN, MSN, CDE Diabetes Coordinator Inpatient Diabetes Program [phone] (Team Pager from 8am to 5pm)

## 2017-04-01 ENCOUNTER — Inpatient Hospital Stay: Payer: Medicare HMO

## 2017-04-01 ENCOUNTER — Ambulatory Visit: Payer: Self-pay

## 2017-04-01 LAB — GLUCOSE, CAPILLARY
Glucose-Capillary: 230 mg/dL — ABNORMAL HIGH (ref 65–99)
Glucose-Capillary: 260 mg/dL — ABNORMAL HIGH (ref 65–99)
Glucose-Capillary: 261 mg/dL — ABNORMAL HIGH (ref 65–99)
Glucose-Capillary: 267 mg/dL — ABNORMAL HIGH (ref 65–99)
Glucose-Capillary: 280 mg/dL — ABNORMAL HIGH (ref 65–99)
Glucose-Capillary: 281 mg/dL — ABNORMAL HIGH (ref 65–99)
Glucose-Capillary: 281 mg/dL — ABNORMAL HIGH (ref 65–99)
Glucose-Capillary: 294 mg/dL — ABNORMAL HIGH (ref 65–99)
Glucose-Capillary: 297 mg/dL — ABNORMAL HIGH (ref 65–99)
Glucose-Capillary: 304 mg/dL — ABNORMAL HIGH (ref 65–99)
Glucose-Capillary: 306 mg/dL — ABNORMAL HIGH (ref 65–99)
Glucose-Capillary: 311 mg/dL — ABNORMAL HIGH (ref 65–99)
Glucose-Capillary: 312 mg/dL — ABNORMAL HIGH (ref 65–99)
Glucose-Capillary: 313 mg/dL — ABNORMAL HIGH (ref 65–99)
Glucose-Capillary: 319 mg/dL — ABNORMAL HIGH (ref 65–99)
Glucose-Capillary: 321 mg/dL — ABNORMAL HIGH (ref 65–99)
Glucose-Capillary: 338 mg/dL — ABNORMAL HIGH (ref 65–99)
Glucose-Capillary: 339 mg/dL — ABNORMAL HIGH (ref 65–99)

## 2017-04-01 LAB — PROTIME-INR
INR: 2.24
Prothrombin Time: 24.6 seconds — ABNORMAL HIGH (ref 11.4–15.2)

## 2017-04-01 LAB — BASIC METABOLIC PANEL
Anion gap: 9 (ref 5–15)
BUN: 28 mg/dL — ABNORMAL HIGH (ref 6–20)
CO2: 24 mmol/L (ref 22–32)
Calcium: 8.6 mg/dL — ABNORMAL LOW (ref 8.9–10.3)
Chloride: 108 mmol/L (ref 101–111)
Creatinine, Ser: 1.44 mg/dL — ABNORMAL HIGH (ref 0.61–1.24)
GFR calc Af Amer: 56 mL/min — ABNORMAL LOW (ref >60)
GFR calc non Af Amer: 48 mL/min — ABNORMAL LOW (ref >60)
Glucose, Bld: 298 mg/dL — ABNORMAL HIGH (ref 65–99)
Potassium: 4.2 mmol/L (ref 3.5–5.1)
Sodium: 141 mmol/L (ref 135–145)

## 2017-04-01 LAB — HEMOGLOBIN A1C
Hgb A1c MFr Bld: 8.6 % — ABNORMAL HIGH (ref 4.8–5.6)
Mean Plasma Glucose: 200.12 mg/dL

## 2017-04-01 MED ORDER — QUETIAPINE FUMARATE 100 MG PO TABS: 100.0000 mg | ORAL_TABLET | Freq: Every day | ORAL | Status: DC

## 2017-04-01 MED ORDER — QUETIAPINE FUMARATE 25 MG PO TABS
50.0000 mg | ORAL_TABLET | Freq: Once | ORAL | Status: AC
  Administered 2017-04-01: 50 mg
  Filled 2017-04-01: qty 2

## 2017-04-01 MED ORDER — AMIODARONE HCL IN DEXTROSE 360-4.14 MG/200ML-% IV SOLN
30.0000 mg/h | INTRAVENOUS | Status: DC
  Administered 2017-04-01 – 2017-04-02 (×2): 30 mg/h via INTRAVENOUS
  Filled 2017-04-01 (×2): qty 200

## 2017-04-01 MED ORDER — AMIODARONE HCL IN DEXTROSE 360-4.14 MG/200ML-% IV SOLN
INTRAVENOUS | Status: AC
  Administered 2017-04-01: 150 mg via INTRAVENOUS
  Filled 2017-04-01: qty 200

## 2017-04-01 MED ORDER — AMIODARONE HCL IN DEXTROSE 360-4.14 MG/200ML-% IV SOLN
60.0000 mg/h | INTRAVENOUS | Status: AC
  Administered 2017-04-01 (×2): 60 mg/h via INTRAVENOUS
  Filled 2017-04-01: qty 200

## 2017-04-01 MED ORDER — AMIODARONE LOAD VIA INFUSION
150.0000 mg | Freq: Once | INTRAVENOUS | Status: AC
  Administered 2017-04-01: 150 mg via INTRAVENOUS
  Filled 2017-04-01: qty 83.34

## 2017-04-01 NOTE — Procedures (Signed)
Central Venous Catheter Placement:TRIPLE LUMEN Indication: Patient receiving vesicant or irritant drug.; Patient receiving intravenous therapy for longer than 5 days.; Patient has limited or no vascular access.   Consent:emergent    Hand washing performed prior to starting the procedure.   Procedure:   An active timeout was performed and correct patient, name, & ID confirmed.   Patient was positioned correctly for central venous access.  Patient was prepped using strict sterile technique including chlorohexadine preps, sterile drape, sterile gown and sterile gloves.    The area was prepped, draped and anesthetized in the usual sterile manner. Patient comfort was obtained.    A triple lumen catheter was placed in LEFT Internal Jugular Vein There was good blood return, catheter caps were placed on lumens, catheter flushed easily, the line was secured and a sterile dressing and BIO-PATCH applied.   Ultrasound was used to visualize vasculature and guidance of needle.   Number of Attempts: 1 Complications:none Estimated Blood Loss: none Chest Radiograph indicated and ordered.  Operator: Conlee Sliter.   Corrin Parker, M.D.  [facility]  Medical Director [place] Director [facility] Cardio-Pulmonary Department

## 2017-04-01 NOTE — Progress Notes (Signed)
WUA was done this morning. Patient was extremely agitated, HR in the 170's-190's. Pt was given 83m of ativan and a 1063m bolus of fentanyl. Pt remained in the 170's. MD notified. EKG ordered and obtained. Patient was in afib rvr. MD updated. Amiodarone bolus and drip ordered. Patient access is limited to two peripherals. Insulin drip was paused to administer amiodarone. CVC was ordered per md. Will continue insulin drip when access is obtained.

## 2017-04-01 NOTE — Consult Note (Signed)
   Name: Christian Berg MRN: [id number] DOB: 1950/04/18     CONSULTATION DATE: 03/30/2017  REFERRING MD :  Pryor Ochoa  CHIEF COMPLAINT:  resp failure  HISTORY OF PRESENT ILLNESS:   emergent intubation for severe epiglottitis. Patient now intubated and sedated On vent support Remains critically ill Follow up ENT recs Now with acute AFIB Swelling all ext Needs CVL Family updated   REVIEW OF SYSTEMS:   Unable to obtain due to critical illness ALL OTHER ROS ARE NEGATIVE    VITAL SIGNS: Temp:  [97.8 F (36.6 C)-98.8 F (37.1 C)] 97.9 F (36.6 C) (03/06 0800) Pulse Rate:  [95-139] 139 (03/06 0900) Resp:  [13-23] 23 (03/06 0900) BP: (107-148)/(59-128) 134/101 (03/06 0800) SpO2:  [94 %-99 %] 95 % (03/06 0900) FiO2 (%):  [30 %-35 %] 30 % (03/06 0800) Weight:  [293 lb 14 oz (133.3 kg)-296 lb 1.2 oz (134.3 kg)] 293 lb 14 oz (133.3 kg) (03/06 0432)  Physical Examination:  GENERAL:critically ill appearing, on vent HEAD: Normocephalic, atraumatic.  EYES: Pupils equal, round, reactive to light.  No scleral icterus.  MOUTH: Moist mucosal membrane. NECK: Supple. No thyromegaly. No nodules. No JVD.  PULMONARY: +rhonchi, +wheezing CARDIOVASCULAR: S1 and S2. Regular rate and rhythm. No murmurs, rubs, or gallops.  GASTROINTESTINAL: Soft, nontender, -distended. No masses. Positive bowel sounds. No hepatosplenomegaly.  MUSCULOSKELETAL: No swelling, clubbing, or edema.  NEUROLOGIC: obtunded SKIN:intact,warm,dry       Recent Labs  Lab 03/30/17 0538 03/31/17 0406 04/01/17 0521  NA 139 135 141  K 4.0 4.5 4.2  CL 105 106 108  CO2 25 23 24   BUN 9 19 28*  CREATININE 1.41* 1.73* 1.44*  GLUCOSE 205* 339* 298*   Recent Labs  Lab 03/30/17 0538  HGB 13.5  HCT 38.9*  WBC 7.6  PLT 192     ASSESSMENT / PLAN: 67 yo obese white male with multiple medical issues admitted to ICU for severe upper airway swelling and epiglottitis with acute upper airway obstruction now intubated for  severe resp failure complicated by acute agitation and afib with RVR  1.continue vent support 2.sedation as needed 3.IV abx 4.Iv steroids 5.follow up ENT recs 6.place CVL  Will need to assess for cuff leaks and ENT suggestion for trials of extubation  Critical Care Time devoted to patient care services described in this note is 32 minutes.   Overall, patient is critically ill, prognosis is guarded.  Patient with Multiorgan failure and at high risk for cardiac arrest and death.    Corrin Parker, M.D.  [facility]  Medical Director [place] Director [facility] Cardio-Pulmonary Department

## 2017-04-02 LAB — BASIC METABOLIC PANEL
Anion gap: 8 (ref 5–15)
BUN: 34 mg/dL — ABNORMAL HIGH (ref 6–20)
CO2: 29 mmol/L (ref 22–32)
Calcium: 8.5 mg/dL — ABNORMAL LOW (ref 8.9–10.3)
Chloride: 108 mmol/L (ref 101–111)
Creatinine, Ser: 1.33 mg/dL — ABNORMAL HIGH (ref 0.61–1.24)
GFR calc Af Amer: >60 mL/min (ref >60)
GFR calc non Af Amer: 53 mL/min — ABNORMAL LOW (ref >60)
Glucose, Bld: 216 mg/dL — ABNORMAL HIGH (ref 65–99)
Potassium: 4.3 mmol/L (ref 3.5–5.1)
Sodium: 145 mmol/L (ref 135–145)

## 2017-04-02 LAB — GLUCOSE, CAPILLARY
Glucose-Capillary: 103 mg/dL — ABNORMAL HIGH (ref 65–99)
Glucose-Capillary: 118 mg/dL — ABNORMAL HIGH (ref 65–99)
Glucose-Capillary: 129 mg/dL — ABNORMAL HIGH (ref 65–99)
Glucose-Capillary: 134 mg/dL — ABNORMAL HIGH (ref 65–99)
Glucose-Capillary: 141 mg/dL — ABNORMAL HIGH (ref 65–99)
Glucose-Capillary: 144 mg/dL — ABNORMAL HIGH (ref 65–99)
Glucose-Capillary: 146 mg/dL — ABNORMAL HIGH (ref 65–99)
Glucose-Capillary: 149 mg/dL — ABNORMAL HIGH (ref 65–99)
Glucose-Capillary: 151 mg/dL — ABNORMAL HIGH (ref 65–99)
Glucose-Capillary: 163 mg/dL — ABNORMAL HIGH (ref 65–99)
Glucose-Capillary: 167 mg/dL — ABNORMAL HIGH (ref 65–99)
Glucose-Capillary: 171 mg/dL — ABNORMAL HIGH (ref 65–99)
Glucose-Capillary: 171 mg/dL — ABNORMAL HIGH (ref 65–99)
Glucose-Capillary: 176 mg/dL — ABNORMAL HIGH (ref 65–99)
Glucose-Capillary: 178 mg/dL — ABNORMAL HIGH (ref 65–99)
Glucose-Capillary: 178 mg/dL — ABNORMAL HIGH (ref 65–99)
Glucose-Capillary: 188 mg/dL — ABNORMAL HIGH (ref 65–99)
Glucose-Capillary: 189 mg/dL — ABNORMAL HIGH (ref 65–99)
Glucose-Capillary: 192 mg/dL — ABNORMAL HIGH (ref 65–99)
Glucose-Capillary: 199 mg/dL — ABNORMAL HIGH (ref 65–99)
Glucose-Capillary: 224 mg/dL — ABNORMAL HIGH (ref 65–99)
Glucose-Capillary: 232 mg/dL — ABNORMAL HIGH (ref 65–99)
Glucose-Capillary: 238 mg/dL — ABNORMAL HIGH (ref 65–99)
Glucose-Capillary: 249 mg/dL — ABNORMAL HIGH (ref 65–99)

## 2017-04-02 LAB — PROTIME-INR
INR: 1.87
Prothrombin Time: 21.4 seconds — ABNORMAL HIGH (ref 11.4–15.2)

## 2017-04-02 MED ORDER — FUROSEMIDE 10 MG/ML IJ SOLN
60.0000 mg | Freq: Once | INTRAMUSCULAR | Status: AC
  Administered 2017-04-02: 60 mg via INTRAVENOUS
  Filled 2017-04-02: qty 6

## 2017-04-02 MED ORDER — DEXAMETHASONE SODIUM PHOSPHATE 4 MG/ML IJ SOLN
4.0000 mg | Freq: Two times a day (BID) | INTRAMUSCULAR | Status: DC
  Administered 2017-04-02 – 2017-04-06 (×8): 4 mg via INTRAVENOUS
  Filled 2017-04-02 (×8): qty 1

## 2017-04-02 MED ORDER — ENOXAPARIN SODIUM 40 MG/0.4ML ~~LOC~~ SOLN
40.0000 mg | Freq: Two times a day (BID) | SUBCUTANEOUS | Status: DC
  Administered 2017-04-02 – 2017-04-10 (×16): 40 mg via SUBCUTANEOUS
  Filled 2017-04-02 (×16): qty 0.4

## 2017-04-02 MED ORDER — BISACODYL 10 MG RE SUPP
10.0000 mg | Freq: Every day | RECTAL | Status: DC | PRN
  Administered 2017-04-05: 10 mg via RECTAL
  Filled 2017-04-02: qty 1

## 2017-04-02 MED ORDER — DEXTROSE 5 % IV SOLN
INTRAVENOUS | Status: DC
  Administered 2017-04-02 (×2): via INTRAVENOUS

## 2017-04-02 MED ORDER — AMIODARONE HCL IN DEXTROSE 360-4.14 MG/200ML-% IV SOLN
60.0000 mg/h | INTRAVENOUS | Status: AC
  Administered 2017-04-02: 60 mg/h via INTRAVENOUS
  Filled 2017-04-02: qty 200

## 2017-04-02 MED ORDER — MORPHINE SULFATE (PF) 2 MG/ML IV SOLN
2.0000 mg | Freq: Once | INTRAVENOUS | Status: AC
  Administered 2017-04-02: 2 mg via INTRAVENOUS

## 2017-04-02 MED ORDER — DILTIAZEM LOAD VIA INFUSION
20.0000 mg | Freq: Once | INTRAVENOUS | Status: AC
  Administered 2017-04-02: 20 mg via INTRAVENOUS
  Filled 2017-04-02: qty 20

## 2017-04-02 MED ORDER — MORPHINE SULFATE (PF) 2 MG/ML IV SOLN
INTRAVENOUS | Status: AC
  Administered 2017-04-02: 2 mg via INTRAVENOUS
  Filled 2017-04-02: qty 1

## 2017-04-02 MED ORDER — AMIODARONE LOAD VIA INFUSION
150.0000 mg | Freq: Once | INTRAVENOUS | Status: AC
  Administered 2017-04-02: 150 mg via INTRAVENOUS

## 2017-04-02 MED ORDER — SODIUM CHLORIDE 0.9 % IV SOLN
INTRAVENOUS | Status: DC
  Administered 2017-04-02: 8.1 [IU]/h via INTRAVENOUS
  Filled 2017-04-02: qty 1

## 2017-04-02 MED ORDER — DEXTROSE 5 % IV SOLN
5.0000 mg/h | INTRAVENOUS | Status: DC
  Administered 2017-04-02: 15 mg/h via INTRAVENOUS
  Administered 2017-04-02: 5 mg/h via INTRAVENOUS
  Administered 2017-04-03: 10 mg/h via INTRAVENOUS
  Filled 2017-04-02 (×3): qty 100

## 2017-04-02 MED ORDER — AMIODARONE HCL IN DEXTROSE 360-4.14 MG/200ML-% IV SOLN
30.0000 mg/h | INTRAVENOUS | Status: DC
  Administered 2017-04-02 – 2017-04-05 (×7): 30 mg/h via INTRAVENOUS
  Filled 2017-04-02 (×3): qty 200
  Filled 2017-04-02: qty 400
  Filled 2017-04-02 (×2): qty 200

## 2017-04-02 MED ORDER — AMIODARONE HCL IN DEXTROSE 360-4.14 MG/200ML-% IV SOLN: 30.0000 mg/h | INTRAVENOUS | Status: DC

## 2017-04-02 NOTE — Progress Notes (Signed)
17:45 Patient moaning with HR in the 170's. Pt's son stated that he thinks the patient told him he can't breathe. Patient's O2 sat at 98%. RR at 18. Patient looks visibly uncomfortable. MD notified. 149m of Amio bolus ordered. 269mof morphine ordered.  18:19: Patient's HR in the 130's. Patient currently resting, with eyes closed.

## 2017-04-02 NOTE — Consult Note (Signed)
   Name: Christian Berg MRN: [id number] DOB: 1950/03/28     CONSULTATION DATE: 03/30/2017  REFERRING MD :  Pryor Ochoa  CHIEF COMPLAINT:  resp failure   HISTORY OF PRESENT ILLNESS:   emergent intubation for severe epiglottitis. Patient now intubated and sedated On vent support Remains critically ill Follow up ENT recs Now with acute AFIB Family updated Plan for trial of extubation today with family and ENT at bedside    REVIEW OF SYSTEMS:   Unable to obtain due to critical illness ALL OTHER ROS ARE NEGATIVE    VITAL SIGNS: Temp:  [97.6 F (36.4 C)-99.2 F (37.3 C)] 99.2 F (37.3 C) (03/07 0800) Pulse Rate:  [81-115] 85 (03/07 0800) Resp:  [12-20] 15 (03/07 0800) BP: (98-161)/(69-92) 161/81 (03/07 0800) SpO2:  [95 %-99 %] 98 % (03/07 0800) FiO2 (%):  [30 %] 30 % (03/07 0800) Weight:  [297 lb 13.5 oz (135.1 kg)] 297 lb 13.5 oz (135.1 kg) (03/07 0500)  Physical Examination:  GENERAL:critically ill appearing, on vent HEAD: Normocephalic, atraumatic.  EYES: Pupils equal, round, reactive to light.  No scleral icterus.  MOUTH: Moist mucosal membrane. NECK: Supple. No thyromegaly. No nodules. No JVD.  PULMONARY: +rhonchi, +wheezing CARDIOVASCULAR: S1 and S2. Regular rate and rhythm. No murmurs, rubs, or gallops.  GASTROINTESTINAL: Soft, nontender, -distended. No masses. Positive bowel sounds. No hepatosplenomegaly.  MUSCULOSKELETAL: No swelling, clubbing, or edema.  NEUROLOGIC: gcs<8T SKIN:intact,warm,dry       Recent Labs  Lab 03/31/17 0406 04/01/17 0521 04/02/17 0415  NA 135 141 145  K 4.5 4.2 4.3  CL 106 108 108  CO2 23 24 29   BUN 19 28* 34*  CREATININE 1.73* 1.44* 1.33*  GLUCOSE 339* 298* 216*   Recent Labs  Lab 03/30/17 0538  HGB 13.5  HCT 38.9*  WBC 7.6  PLT 192     ASSESSMENT / PLAN: 67 yo obese white male with multiple medical issues admitted to ICU for severe upper airway swelling and epiglottitis with acute upper airway obstruction now  intubated for severe resp failure complicated by acute agitation and afib with RVR  1.continue vent support 2.sedation as needed 3.IV abx 4.Iv steroids 5.follow up ENT recs   Will need to assess for cuff leaks and ENT suggestion for trials of extubation today Family at bedside  Critical Care Time devoted to patient care services described in this note is 31 minutes.   Overall, patient is critically ill, prognosis is guarded.  Patient with Multiorgan failure and at high risk for cardiac arrest and death.    Corrin Parker, M.D.  [facility]  Medical Director [place] Director [facility] Cardio-Pulmonary Department

## 2017-04-02 NOTE — Progress Notes (Signed)
Pt. Extubated to 2lnc . No apparent distress noted at this time.

## 2017-04-02 NOTE — Progress Notes (Signed)
.. 04/02/2017 7:01 AM  Jabier Mutton [id number]  Post-Op Day 3    Temp:  [97.6 F (36.4 C)-98.5 F (36.9 C)] 98 F (36.7 C) (03/07 0000) Pulse Rate:  [81-139] 98 (03/07 0600) Resp:  [12-23] 17 (03/07 0600) BP: (98-145)/(69-101) 141/84 (03/07 0600) SpO2:  [95 %-99 %] 96 % (03/07 0600) FiO2 (%):  [30 %] 30 % (03/07 0322) Weight:  [135.1 kg (297 lb 13.5 oz)] 135.1 kg (297 lb 13.5 oz) (03/07 0500),     Intake/Output Summary (Last 24 hours) at 04/02/2017 0701 Last data filed at 04/02/2017 0600 Gross per 24 hour  Intake 4552.52 ml  Output 2800 ml  Net 1752.52 ml    Results for orders placed or performed during the hospital encounter of 03/30/17 (from the past 24 hour(s))  Glucose, capillary     Status: Abnormal   Collection Time: 04/01/17  7:48 AM  Result Value Ref Range   Glucose-Capillary 230 (H) 65 - 99 mg/dL  Glucose, capillary     Status: Abnormal   Collection Time: 04/01/17 12:07 PM  Result Value Ref Range   Glucose-Capillary 280 (H) 65 - 99 mg/dL  Glucose, capillary     Status: Abnormal   Collection Time: 04/01/17  1:23 PM  Result Value Ref Range   Glucose-Capillary 339 (H) 65 - 99 mg/dL  Glucose, capillary     Status: Abnormal   Collection Time: 04/01/17  2:43 PM  Result Value Ref Range   Glucose-Capillary 294 (H) 65 - 99 mg/dL  Glucose, capillary     Status: Abnormal   Collection Time: 04/01/17  3:47 PM  Result Value Ref Range   Glucose-Capillary 321 (H) 65 - 99 mg/dL  Glucose, capillary     Status: Abnormal   Collection Time: 04/01/17  5:07 PM  Result Value Ref Range   Glucose-Capillary 306 (H) 65 - 99 mg/dL  Glucose, capillary     Status: Abnormal   Collection Time: 04/01/17  6:13 PM  Result Value Ref Range   Glucose-Capillary 313 (H) 65 - 99 mg/dL  Glucose, capillary     Status: Abnormal   Collection Time: 04/01/17  7:57 PM  Result Value Ref Range   Glucose-Capillary 260 (H) 65 - 99 mg/dL  Glucose, capillary     Status: Abnormal   Collection Time:  04/01/17  9:04 PM  Result Value Ref Range   Glucose-Capillary 281 (H) 65 - 99 mg/dL  Glucose, capillary     Status: Abnormal   Collection Time: 04/01/17 10:07 PM  Result Value Ref Range   Glucose-Capillary 267 (H) 65 - 99 mg/dL  Glucose, capillary     Status: Abnormal   Collection Time: 04/01/17 11:04 PM  Result Value Ref Range   Glucose-Capillary 311 (H) 65 - 99 mg/dL  Glucose, capillary     Status: Abnormal   Collection Time: 04/02/17 12:04 AM  Result Value Ref Range   Glucose-Capillary 249 (H) 65 - 99 mg/dL  Glucose, capillary     Status: Abnormal   Collection Time: 04/02/17  1:02 AM  Result Value Ref Range   Glucose-Capillary 224 (H) 65 - 99 mg/dL  Glucose, capillary     Status: Abnormal   Collection Time: 04/02/17  2:02 AM  Result Value Ref Range   Glucose-Capillary 238 (H) 65 - 99 mg/dL  Glucose, capillary     Status: Abnormal   Collection Time: 04/02/17  3:02 AM  Result Value Ref Range   Glucose-Capillary 232 (H) 65 - 99 mg/dL  Protime-INR  Status: Abnormal   Collection Time: 04/02/17  4:15 AM  Result Value Ref Range   Prothrombin Time 21.4 (H) 11.4 - 15.2 seconds   INR 6.16   Basic metabolic panel     Status: Abnormal   Collection Time: 04/02/17  4:15 AM  Result Value Ref Range   Sodium 145 135 - 145 mmol/L   Potassium 4.3 3.5 - 5.1 mmol/L   Chloride 108 101 - 111 mmol/L   CO2 29 22 - 32 mmol/L   Glucose, Bld 216 (H) 65 - 99 mg/dL   BUN 34 (H) 6 - 20 mg/dL   Creatinine, Ser 1.33 (H) 0.61 - 1.24 mg/dL   Calcium 8.5 (L) 8.9 - 10.3 mg/dL   GFR calc non Af Amer 53 (L) >60 mL/min   GFR calc Af Amer >60 >60 mL/min   Anion gap 8 5 - 15    SUBJECTIVE:  No acute events overnight.    OBJECTIVE:  GEN-  Sedated and intbuated OC/OP-  ETT in place NECK-  Midline landmarks palpated   IMPRESSION:  S/p emergent intubation for supraglottitis  PLAN:  Trial of extubation today per Pulmonology.  Will plan to be at bedside with scope.  Kejon Feild 04/02/2017, 7:01  AM

## 2017-04-02 NOTE — Progress Notes (Signed)
SLP Cancellation Note  Patient Details Name: Christian Berg MRN: [id number] DOB: 1950/07/12   Cancelled treatment:       Reason Eval/Treat Not Completed: Fatigue/lethargy limiting ability to participate;Medical issues which prohibited therapy(chart reviewed; consulted NSG). NSG reported pt was lethargic and "not safe" for any oral intake at this time. She indicated he was poorly arousable to any stimulation. ST services will f/u tomorrow for BSE. Recommend oral care for hygiene and stimulation if awakens; aspiration precautions.    Orinda Kenner, MS, CCC-SLP [name] 04/02/2017, 5:16 PM

## 2017-04-02 NOTE — Progress Notes (Signed)
Nutrition Follow-up  DOCUMENTATION CODES:   Morbid obesity  INTERVENTION:  RD will monitor for diet advancement. Once diet able to be advanced patient will benefit from oral nutrition supplement to help meet calorie/protein needs.  NUTRITION DIAGNOSIS:   Inadequate oral intake related to inability to eat as evidenced by NPO status.  Ongoing.  GOAL:   Patient will meet greater than or equal to 90% of their needs  Not met.  MONITOR:   Diet advancement, Labs, Weight trends, Skin, I & O's  REASON FOR ASSESSMENT:   Ventilator    ASSESSMENT:   67 year old male with PMHx of HTN, PVD, CHF, hx TIA, hypercholesteremia, degenerative disc disease, DM type 2, A-fib, s/p cardiac pacemaker placement who presented with throat pain and swelling, began having difficulty breathing and inability to swallow secretions found to have severe epiglottitis requiring emergent intubation 3/4.  -Patient was extubated this morning. NGT was removed. Patient had previously been tolerating tube feeds well.  Patient was lethargic at time of RD assessment this morning. Plan was for PT and SLP consult today. Per chart patient was not alert enough for PT today. Per discussion with RN in the afternoon patient was seen by SLP and is going to remain NPO at this time.  Medications reviewed and include: Decadron 4 mg Q12hrs IV, Senokot, Colace, amiodarone gtt, D5 @ 50 mL/hr (60 grams dextrose, 204 kcal daily), diltiazem gtt, famotidine, Zosyn.  Labs reviewed: CBG 103-232, BUN 34, Creatinine 1.33.  Discussed with RN and on rounds.  Diet Order:  Diet NPO time specified  EDUCATION NEEDS:   No education needs have been identified at this time  Skin:  Skin Assessment: Skin Integrity Issues: Skin Integrity Issues:: Unstageable, Other (Comment) Unstageable: coccyx Other: MSAD to bilateral groin  Last BM:  Unknown  Height:   Ht Readings from Last 1 Encounters:  03/31/17 _0  (1.727 m)    Weight:   Wt  Readings from Last 1 Encounters:  04/02/17 297 lb 13.5 oz (135.1 kg)    Ideal Body Weight:  70 kg  BMI:  Body mass index is 45.29 kg/m.  Estimated Nutritional Needs:   Kcal:  5940-9050 (11-14 kcal/kg)  Protein:  >/= 175 grams (>/= 2.5 grams/kg IBW)  Fluid:  1.75 L/day (25 mL/kg IBW)  Willey Blade, MS, RD, LDN Office: [phone] Pager: [phone] After Hours/Weekend Pager: [phone]

## 2017-04-02 NOTE — Progress Notes (Signed)
[place] for Warfarin Indication: atrial fibrillation  Allergies  Allergen Reactions  . Clindamycin/Lincomycin     Chest discomfort  . Sulfa Antibiotics Hives  . Sulfasalazine Hives    Patient Measurements: Height: 5' 8"  (172.7 cm) Weight: 297 lb 13.5 oz (135.1 kg) IBW/kg (Calculated) : 68.4   Vital Signs: Temp: 97.8 F (36.6 C) (03/07 1200) Temp Source: Oral (03/07 1200) BP: 170/120 (03/07 1500) Pulse Rate: 136 (03/07 1500)  Labs: Recent Labs    03/31/17 0406 04/01/17 0521 04/02/17 0415  LABPROT 32.2* 24.6* 21.4*  INR 3.16 2.24 1.87  CREATININE 1.73* 1.44* 1.33*    Estimated Creatinine Clearance: 71.5 mL/min (A) (by C-G formula based on SCr of 1.33 mg/dL (H)).   Medical History: Past Medical History:  Diagnosis Date  . A-fib ([place])   . CHF (congestive heart failure) ([place])   . Degenerative disc disease, lumbar    s/p injury  . Diabetes mellitus without complication ([place])   . Disseminated superficial actinic porokeratosis   . Dysrhythmia    A-FIB, palpatations  . Hypercholesteremia   . Hypertension   . Kidney stones   . Peripheral vascular disease ([place])    Carotid stenosis  . Presence of permanent cardiac pacemaker    Inactive  . TIA (transient ischemic attack) 2011   No deficits    Assessment: 67 y/o M with a h/o atrial fibrillation on warfarin 7.5 mg daily PTA admitted with epiglottitis and intubated. Vitamin K and KCentra ordered with elevated INR for possible trach but not administered.   Patient extubated 3/7 and INR < 2.   Goal of Therapy:  INR 2-3   Plan:  Will plan to start Lovenox for DVT prophylaxis and resume warfarin when patient is able to take oral medications.   Ulice Dash D 04/02/2017,4:08 PM

## 2017-04-02 NOTE — Progress Notes (Signed)
10:15: Patient's HR was in the 170-180's after extubation. MD notified. Patient given another loading dose of amiodarone per MD.  11:20: Patient HR came down into the 130-140's. Patients responses to voice. Difficult to understand what patient is saying. Pt is still lethargic and unable able to lift head off pillow or raise his hands. However, he is following commands.

## 2017-04-02 NOTE — Progress Notes (Signed)
19:00: Patients HR back into the 170's. Patient is continually moaning, unable to communicate what is bothering him. Patient's O2 sats are in the high 90's but secretions can be heard in the back of his throat. [place] notified.  Awaiting orders per MD.

## 2017-04-02 NOTE — Progress Notes (Signed)
PT Cancellation Note  Patient Details Name: SEVIN LANGENBACH MRN: [id number] DOB: [birth year]   Cancelled Treatment:     Per RN, pt is tachycardic and not A&O at this time and asked to hold evaluation until tomorrow.  PT will re-attempt 04/03/2017.   Roxanne Gates, PT, DPT 04/02/2017, 2:18 PM

## 2017-04-03 ENCOUNTER — Other Ambulatory Visit: Payer: Self-pay

## 2017-04-03 LAB — GLUCOSE, CAPILLARY
Glucose-Capillary: 142 mg/dL — ABNORMAL HIGH (ref 65–99)
Glucose-Capillary: 147 mg/dL — ABNORMAL HIGH (ref 65–99)
Glucose-Capillary: 149 mg/dL — ABNORMAL HIGH (ref 65–99)
Glucose-Capillary: 152 mg/dL — ABNORMAL HIGH (ref 65–99)
Glucose-Capillary: 155 mg/dL — ABNORMAL HIGH (ref 65–99)
Glucose-Capillary: 159 mg/dL — ABNORMAL HIGH (ref 65–99)
Glucose-Capillary: 163 mg/dL — ABNORMAL HIGH (ref 65–99)
Glucose-Capillary: 166 mg/dL — ABNORMAL HIGH (ref 65–99)
Glucose-Capillary: 166 mg/dL — ABNORMAL HIGH (ref 65–99)
Glucose-Capillary: 172 mg/dL — ABNORMAL HIGH (ref 65–99)
Glucose-Capillary: 172 mg/dL — ABNORMAL HIGH (ref 65–99)
Glucose-Capillary: 179 mg/dL — ABNORMAL HIGH (ref 65–99)
Glucose-Capillary: 185 mg/dL — ABNORMAL HIGH (ref 65–99)
Glucose-Capillary: 191 mg/dL — ABNORMAL HIGH (ref 65–99)
Glucose-Capillary: 193 mg/dL — ABNORMAL HIGH (ref 65–99)
Glucose-Capillary: 326 mg/dL — ABNORMAL HIGH (ref 65–99)
Glucose-Capillary: 329 mg/dL — ABNORMAL HIGH (ref 65–99)
Glucose-Capillary: 372 mg/dL — ABNORMAL HIGH (ref 65–99)

## 2017-04-03 LAB — BASIC METABOLIC PANEL
Anion gap: 11 (ref 5–15)
BUN: 34 mg/dL — ABNORMAL HIGH (ref 6–20)
CO2: 30 mmol/L (ref 22–32)
Calcium: 8.5 mg/dL — ABNORMAL LOW (ref 8.9–10.3)
Chloride: 101 mmol/L (ref 101–111)
Creatinine, Ser: 1.47 mg/dL — ABNORMAL HIGH (ref 0.61–1.24)
GFR calc Af Amer: 55 mL/min — ABNORMAL LOW (ref >60)
GFR calc non Af Amer: 47 mL/min — ABNORMAL LOW (ref >60)
Glucose, Bld: 197 mg/dL — ABNORMAL HIGH (ref 65–99)
Potassium: 3.8 mmol/L (ref 3.5–5.1)
Sodium: 142 mmol/L (ref 135–145)

## 2017-04-03 LAB — TROPONIN I
Troponin I: <0.03 ng/mL (ref <0.03)
Troponin I: <0.03 ng/mL (ref <0.03)
Troponin I: <0.03 ng/mL (ref <0.03)

## 2017-04-03 LAB — PROTIME-INR
INR: 1.67
Prothrombin Time: 19.6 seconds — ABNORMAL HIGH (ref 11.4–15.2)

## 2017-04-03 MED ORDER — LABETALOL HCL 5 MG/ML IV SOLN
INTRAVENOUS | Status: AC
  Filled 2017-04-03: qty 4

## 2017-04-03 MED ORDER — DEXMEDETOMIDINE HCL IN NACL 400 MCG/100ML IV SOLN
0.0000 ug/kg/h | INTRAVENOUS | Status: DC
  Administered 2017-04-03: 1.2 ug/kg/h via INTRAVENOUS
  Administered 2017-04-03: 0.6 ug/kg/h via INTRAVENOUS
  Administered 2017-04-03: 1 ug/kg/h via INTRAVENOUS
  Administered 2017-04-03: 0.7 ug/kg/h via INTRAVENOUS
  Administered 2017-04-03: 0.6 ug/kg/h via INTRAVENOUS
  Administered 2017-04-03 (×3): 1 ug/kg/h via INTRAVENOUS
  Administered 2017-04-04 (×2): 1.2 ug/kg/h via INTRAVENOUS
  Administered 2017-04-04: 1.199 ug/kg/h via INTRAVENOUS
  Administered 2017-04-04 (×2): 1.2 ug/kg/h via INTRAVENOUS
  Administered 2017-04-04: 1 ug/kg/h via INTRAVENOUS
  Administered 2017-04-04 – 2017-04-05 (×4): 1.2 ug/kg/h via INTRAVENOUS
  Administered 2017-04-05: 1.1 ug/kg/h via INTRAVENOUS
  Administered 2017-04-05: 0.8 ug/kg/h via INTRAVENOUS
  Filled 2017-04-03 (×18): qty 100

## 2017-04-03 MED ORDER — LABETALOL HCL 5 MG/ML IV SOLN
10.0000 mg | Freq: Once | INTRAVENOUS | Status: AC
  Administered 2017-04-03: 10 mg via INTRAVENOUS

## 2017-04-03 MED ORDER — INSULIN ASPART 100 UNIT/ML ~~LOC~~ SOLN
3.0000 [IU] | SUBCUTANEOUS | Status: DC
  Administered 2017-04-03: 3 [IU] via SUBCUTANEOUS

## 2017-04-03 MED ORDER — INSULIN ASPART 100 UNIT/ML ~~LOC~~ SOLN
0.0000 [IU] | SUBCUTANEOUS | Status: DC
  Administered 2017-04-03: 15 [IU] via SUBCUTANEOUS
  Administered 2017-04-04: 4 [IU] via SUBCUTANEOUS
  Administered 2017-04-04: 7 [IU] via SUBCUTANEOUS
  Administered 2017-04-04: 15 [IU] via SUBCUTANEOUS
  Administered 2017-04-04: 7 [IU] via SUBCUTANEOUS
  Administered 2017-04-04: 15 [IU] via SUBCUTANEOUS
  Administered 2017-04-04: 20 [IU] via SUBCUTANEOUS
  Administered 2017-04-05: 15 [IU] via SUBCUTANEOUS
  Administered 2017-04-05 (×2): 7 [IU] via SUBCUTANEOUS
  Administered 2017-04-05: 11 [IU] via SUBCUTANEOUS
  Filled 2017-04-03 (×10): qty 1

## 2017-04-03 MED ORDER — INSULIN ASPART 100 UNIT/ML ~~LOC~~ SOLN
SUBCUTANEOUS | Status: AC
  Filled 2017-04-03: qty 1

## 2017-04-03 MED ORDER — INSULIN DETEMIR 100 UNIT/ML ~~LOC~~ SOLN
20.0000 [IU] | Freq: Two times a day (BID) | SUBCUTANEOUS | Status: DC
  Administered 2017-04-03 – 2017-04-06 (×6): 20 [IU] via SUBCUTANEOUS
  Filled 2017-04-03 (×7): qty 0.2

## 2017-04-03 NOTE — Progress Notes (Signed)
[place] for Warfarin Indication: atrial fibrillation  Allergies  Allergen Reactions  . Clindamycin/Lincomycin     Chest discomfort  . Sulfa Antibiotics Hives  . Sulfasalazine Hives    Patient Measurements: Height: 5' 8"  (172.7 cm) Weight: 292 lb 5.3 oz (132.6 kg) IBW/kg (Calculated) : 68.4   Vital Signs: Temp: 98.9 F (37.2 C) (03/08 2017) Temp Source: Axillary (03/08 2017) BP: 159/84 (03/08 1800) Pulse Rate: 68 (03/08 1800)  Labs: Recent Labs    04/01/17 0521 04/02/17 0415 04/03/17 0110 04/03/17 0411 04/03/17 0647 04/03/17 1500  LABPROT 24.6* 21.4*  --  19.6*  --   --   INR 2.24 1.87  --  1.67  --   --   CREATININE 1.44* 1.33*  --  1.47*  --   --   TROPONINI  --   --  <0.03  --  <0.03 <0.03    Estimated Creatinine Clearance: 64 mL/min (A) (by C-G formula based on SCr of 1.47 mg/dL (H)).   Medical History: Past Medical History:  Diagnosis Date  . A-fib ([place])   . CHF (congestive heart failure) ([place])   . Degenerative disc disease, lumbar    s/p injury  . Diabetes mellitus without complication ([place])   . Disseminated superficial actinic porokeratosis   . Dysrhythmia    A-FIB, palpatations  . Hypercholesteremia   . Hypertension   . Kidney stones   . Peripheral vascular disease ([place])    Carotid stenosis  . Presence of permanent cardiac pacemaker    Inactive  . TIA (transient ischemic attack) 2011   No deficits    Assessment: 67 y/o M with a h/o atrial fibrillation on warfarin 7.5 mg daily PTA admitted with epiglottitis and intubated. Vitamin K and KCentra ordered with elevated INR for possible trach but not administered.   Patient extubated 3/7 and INR < 2.   Goal of Therapy:  INR 2-3   Plan:  Continue Lovenox for DVT prophylaxis and resume warfarin when patient is able to take oral medications.   Zhavia Cunanan L 04/03/2017,8:43 PM

## 2017-04-03 NOTE — Progress Notes (Signed)
PT Cancellation Note  Patient Details Name: Christian Berg MRN: [id number] DOB: 08/19/1950   Cancelled Treatment:    Reason Eval/Treat Not Completed: Other (comment). Consult received and chart reviewed. Pt started on precedex drip this morning secondary to agitation. Pt currently very lethargic and not appropriate for participation with PT at this time. Will re-attempt when pt more alert.   Maila Dukes 04/03/2017, 11:08 AM  Greggory Stallion, PT, DPT [phone]

## 2017-04-03 NOTE — Progress Notes (Signed)
   04/03/17 1045  Clinical Encounter Type  Visited With Family  Visit Type Initial   Introductory chaplain visit, engaged patient spouse. Spouse expressed appreciation of support, no needs to report at present.  Open to ongoing chaplain follow up, encouraged spouse to page chaplain as needed.

## 2017-04-03 NOTE — Progress Notes (Signed)
[facility] Progess Note    SYNOPSIS   Patient with severe epiglottitis, was intubated now status post extubation. Being monitored closely intensive care unit  ASSESSMENT/PLAN   Patient with epiglottitis. Status post extubation.ENT is following patient. Presently from a respiratory standpoint is doing okay. Stable oxygenation without any evidence of stridor at this time. Rosalie on Decadron and Zosyn  Atrial fibrillation. On amiodarone infusion  Encephalopathy. Has been started on Precedex.  Renal insufficiency. Increased BUN, creatinine also slight increase, will folow closely  Hyperglycemia. On coverage    INTAKE / OUTPUT:  Intake/Output Summary (Last 24 hours) at 04/03/2017 0918 Last data filed at 04/03/2017 0900 Gross per 24 hour  Intake 2627 ml  Output 4305 ml  Net -1678 ml   DOB: 1950/02/20    ADMISSION DATE:  03/30/2017   SUBJECTIVE:   Patient is presently extubated, has been encephalopathic overnight but has been able to be reoriented. Presently sleeping on Precedex  VITAL SIGNS: Temp:  [97.1 F (36.2 C)-98.7 F (37.1 C)] 97.1 F (36.2 C) (03/08 0805) Pulse Rate:  [62-157] 62 (03/08 0900) Resp:  [13-22] 22 (03/08 0900) BP: (128-179)/(69-120) 136/82 (03/08 0900) SpO2:  [92 %-99 %] 97 % (03/08 0900) Weight:  [132.6 kg (292 lb 5.3 oz)] 132.6 kg (292 lb 5.3 oz) (03/08 0405)   PHYSICAL EXAMINATION: Physical Examination:   VS: BP 136/82   Pulse 62   Temp (!) 97.1 F (36.2 C) (Axillary)   Resp (!) 22   Ht 5' 8"  (1.727 m)   Wt 132.6 kg (292 lb 5.3 oz)   SpO2 97%   BMI 44.45 kg/m   General Appearance: No distress, sedated on Precedex Neuro:as been encephalopathic overnight, presently on Precedex and sedated HEENT: trachea midline, no stridor appreciated Pulmonary: normal breath sounds   Cardiovascular pacer spikes noted, regular rhythm.   Abdomen: Benign exam Skin:   warm, no rashes, no ecchymosis  Extremities: normal, no  cyanosis, clubbing.  LABORATORY PANEL:   CBC Recent Labs  Lab 03/30/17 0538  WBC 7.6  HGB 13.5  HCT 38.9*  PLT 192    Chemistries  Recent Labs  Lab 03/30/17 0538  04/03/17 0411  NA 139   < > 142  K 4.0   < > 3.8  CL 105   < > 101  CO2 25   < > 30  GLUCOSE 205*   < > 197*  BUN 9   < > 34*  CREATININE 1.41*   < > 1.47*  CALCIUM 9.0   < > 8.5*  AST 45*  --   --   ALT 19  --   --   ALKPHOS 99  --   --   BILITOT 0.9  --   --    < > = values in this interval not displayed.    Recent Labs  Lab 04/03/17 0303 04/03/17 0403 04/03/17 0507 04/03/17 0606 04/03/17 0750 04/03/17 0852  GLUCAP 179* 172* 155* 166* 193* 172*   Recent Labs  Lab 03/30/17 1550  PHART 7.35  PCO2ART 46  PO2ART 96   Recent Labs  Lab 03/30/17 0538  AST 45*  ALT 19  ALKPHOS 99  BILITOT 0.9  ALBUMIN 3.2*    Cardiac Enzymes Recent Labs  Lab 04/03/17 0647  TROPONINI <0.03    RADIOLOGY:  Dg Chest Port 1 View  Result Date: 04/01/2017 CLINICAL DATA:  Central line placement EXAM: PORTABLE CHEST 1 VIEW COMPARISON:  03/30/2017 FINDINGS: Endotracheal tube tip is  5 cm above the carina. Nasogastric tube enters the abdomen. Left internal jugular central line tip is in the SVC just above the right atrium. No pneumothorax. Basilar atelectasis has worsened slightly since 2 days ago. IMPRESSION: Central line tip in the SVC just above the right atrium. No pneumothorax. Slight worsening of basilar atelectasis. Electronically Signed   By: Nelson Chimes M.D.   On: 04/01/2017 12:05   Hermelinda Dellen, DO  04/03/2017

## 2017-04-03 NOTE — Progress Notes (Signed)
Inpatient Diabetes Program Recommendations  AACE/ADA: New Consensus Statement on Inpatient Glycemic Control (2015)  Target Ranges:  Prepandial:   less than 140 mg/dL      Peak postprandial:   less than 180 mg/dL (1-2 hours)      Critically ill patients:  140 - 180 mg/dL   Results for Fellows, BION TODOROV "RAY" (MRN [id number]) as of 04/03/2017 09:51  Ref. Range 04/02/2017 23:47 04/03/2017 01:05 04/03/2017 01:58 04/03/2017 03:03 04/03/2017 04:03 04/03/2017 05:07 04/03/2017 06:06 04/03/2017 07:50 04/03/2017 08:52  Glucose-Capillary Latest Ref Range: 65 - 99 mg/dL 163 (H) 142 (H) 185 (H) 179 (H) 172 (H) 155 (H) 166 (H) 193 (H) 172 (H)     Home DM Meds: Glipizide 10 mg BID       Metformin 850 mg BID  Current Insulin Orders: IV Insulin drip     CBGs have stabilized on the IV Insulin drip, however, IV Insulin drip rates remain >4 units/hour.  Patient continuing to receive Decadron 4 mg BID.  Patient remains NPO.    MD- Once IV Insulin drip rates have stabilized at 4 units/hour or less, may consider transition to SQ insulin.  Recommend transition to Phase 3 of the ICU Glycemic Control Protocol once Insulin drip rates are less than 4 units/hour.     --Will follow patient during hospitalization--  Wyn Quaker RN, MSN, CDE Diabetes Coordinator Inpatient Glycemic Control Team Team Pager: [phone] (8a-5p)

## 2017-04-03 NOTE — Progress Notes (Signed)
SLP Cancellation Note  Patient Details Name: Christian Berg MRN: [id number] DOB: 1950-03-14   Cancelled treatment:       Reason Eval/Treat Not Completed: Fatigue/lethargy limiting ability to participate;Patient's level of consciousness;Patient not medically ready(chart reviewed). Attempted to see pt, but he only gave 2-3 mumbled/garbled words w/ eyes closed to mod+ verbal/tactile stim. Per chart, pt was started on a precedex drip this morning d/t agitation. Pt is not safe for BSE/oral intake at this time d/t lethargy and poor arousability. ST services will f/u tomorrow to assess pt's status and appropriateness for BSE/oral intake tomorrow. Recommend frequent oral care for hygiene and stimulation for swallowing while NPO.    Orinda Kenner, MS, CCC-SLP [name],Katherine 04/03/2017, 12:42 PM

## 2017-04-04 ENCOUNTER — Inpatient Hospital Stay: Payer: Medicare HMO

## 2017-04-04 DIAGNOSIS — J0510 Acute epiglottitis without obstruction: Secondary | ICD-10-CM

## 2017-04-04 LAB — PROTIME-INR
INR: 1.54
Prothrombin Time: 18.4 seconds — ABNORMAL HIGH (ref 11.4–15.2)

## 2017-04-04 LAB — CBC
HCT: 42.2 % (ref 40.0–52.0)
Hemoglobin: 14.1 g/dL (ref 13.0–18.0)
MCH: 33.7 pg (ref 26.0–34.0)
MCHC: 33.4 g/dL (ref 32.0–36.0)
MCV: 100.8 fL — ABNORMAL HIGH (ref 80.0–100.0)
Platelets: 159 10*3/uL (ref 150–440)
RBC: 4.19 MIL/uL — ABNORMAL LOW (ref 4.40–5.90)
RDW: 14.3 % (ref 11.5–14.5)
WBC: 7.2 10*3/uL (ref 3.8–10.6)

## 2017-04-04 LAB — COMPREHENSIVE METABOLIC PANEL
ALT: 50 U/L (ref 17–63)
AST: 77 U/L — ABNORMAL HIGH (ref 15–41)
Albumin: 2.9 g/dL — ABNORMAL LOW (ref 3.5–5.0)
Alkaline Phosphatase: 58 U/L (ref 38–126)
Anion gap: 10 (ref 5–15)
BUN: 45 mg/dL — ABNORMAL HIGH (ref 6–20)
CO2: 29 mmol/L (ref 22–32)
Calcium: 8.6 mg/dL — ABNORMAL LOW (ref 8.9–10.3)
Chloride: 102 mmol/L (ref 101–111)
Creatinine, Ser: 1.46 mg/dL — ABNORMAL HIGH (ref 0.61–1.24)
GFR calc Af Amer: 55 mL/min — ABNORMAL LOW (ref >60)
GFR calc non Af Amer: 48 mL/min — ABNORMAL LOW (ref >60)
Glucose, Bld: 344 mg/dL — ABNORMAL HIGH (ref 65–99)
Potassium: 4.4 mmol/L (ref 3.5–5.1)
Sodium: 141 mmol/L (ref 135–145)
Total Bilirubin: 1.2 mg/dL (ref 0.3–1.2)
Total Protein: 7 g/dL (ref 6.5–8.1)

## 2017-04-04 LAB — GLUCOSE, CAPILLARY
Glucose-Capillary: 323 mg/dL — ABNORMAL HIGH (ref 65–99)
Glucose-Capillary: 305 mg/dL — ABNORMAL HIGH (ref 65–99)
Glucose-Capillary: 237 mg/dL — ABNORMAL HIGH (ref 65–99)
Glucose-Capillary: 240 mg/dL — ABNORMAL HIGH (ref 65–99)
Glucose-Capillary: 190 mg/dL — ABNORMAL HIGH (ref 65–99)
Glucose-Capillary: 207 mg/dL — ABNORMAL HIGH (ref 65–99)

## 2017-04-04 LAB — PHOSPHORUS: Phosphorus: 2.7 mg/dL (ref 2.5–4.6)

## 2017-04-04 LAB — MAGNESIUM: Magnesium: 2.2 mg/dL (ref 1.7–2.4)

## 2017-04-04 MED ORDER — CHLORHEXIDINE GLUCONATE 0.12 % MT SOLN
15.0000 mL | Freq: Two times a day (BID) | OROMUCOSAL | Status: DC
  Administered 2017-04-04 – 2017-04-05 (×3): 15 mL via OROMUCOSAL
  Filled 2017-04-04 (×2): qty 15

## 2017-04-04 MED ORDER — ORAL CARE MOUTH RINSE
15.0000 mL | Freq: Two times a day (BID) | OROMUCOSAL | Status: DC
  Administered 2017-04-04: 15 mL via OROMUCOSAL

## 2017-04-04 MED ORDER — LORAZEPAM 2 MG/ML IJ SOLN
1.0000 mg | Freq: Four times a day (QID) | INTRAMUSCULAR | Status: DC | PRN
  Administered 2017-04-04 – 2017-04-06 (×4): 1 mg via INTRAVENOUS
  Filled 2017-04-04 (×4): qty 1

## 2017-04-04 NOTE — Evaluation (Signed)
Clinical/Bedside Swallow Evaluation Patient Details  Name: Christian Berg MRN: [id number] Date of Birth: July 04, 1950  Today's Date: 04/04/2017 Time: SLP Start Time (ACUTE ONLY): 1045 SLP Stop Time (ACUTE ONLY): 1128 SLP Time Calculation (min) (ACUTE ONLY): 43 min  Past Medical History:  Past Medical History:  Diagnosis Date  . A-fib ([place])   . CHF (congestive heart failure) ([place])   . Degenerative disc disease, lumbar    s/p injury  . Diabetes mellitus without complication ([place])   . Disseminated superficial actinic porokeratosis   . Dysrhythmia    A-FIB, palpatations  . Hypercholesteremia   . Hypertension   . Kidney stones   . Peripheral vascular disease ([place])    Carotid stenosis  . Presence of permanent cardiac pacemaker    Inactive  . TIA (transient ischemic attack) 2011   No deficits   Past Surgical History:  Past Surgical History:  Procedure Laterality Date  . APPENDECTOMY  2004  . CARDIAC CATHETERIZATION    . CARDIAC PACEMAKER PLACEMENT  2005  . CATARACT EXTRACTION W/PHACO Right 06/14/2014   Procedure: CATARACT EXTRACTION PHACO AND INTRAOCULAR LENS PLACEMENT (IOC);  Surgeon: Leandrew Koyanagi, MD;  Location: [facility];  Service: Ophthalmology;  Laterality: Right;  . CYSTOSCOPY W/ RETROGRADES Bilateral 08/09/2014   Procedure: CYSTOSCOPY WITH RETROGRADE PYELOGRAM;  Surgeon: Hollice Espy, MD;  Location: [facility];  Service: Urology;  Laterality: Bilateral;  . INTUBATION-ENDOTRACHEAL WITH TRACHEOSTOMY STANDBY  03/30/2017   Procedure: INTUBATION-ENDOTRACHEAL WITH TRACHEOSTOMY STANDBY;  Surgeon: Carloyn Manner, MD;  Location: [facility];  Service: ENT;;   HPI:    Pt arrived at the hospital with complaints of difficulty breathing. Emergent intubation for severe epiglottitis. Currently extubated, agitated and sedated but yelling for water.  Assessment / Plan / Recommendation Clinical Impression  Pt presents with overt s/s of aspiration with thin liquids  characterized by cough and throat clear. bedside swallow eval was limited secondary to agitation and lethargy. Given sips of thin liquid, Pt gulped continuously and beacme agitated when ST only allowed for small sips. Noted coughing and throat clearing after gulping the liquids. Pt did tolerate honey thick liquids without s/s of aspiration. Pt demanded more water but seemed satisfied with ice chips one at a time which he tolerated well. Oral transit was appropriate for applesauce, however, Pt refused after one bite, requesting more water. Rec Dys 1 diet with hioney thick liquids ONLY to be given if Pt is more alert. Pt may have ice chips IN MODERATION when requesting water. Wife and nsg instructed on recs and risk for aspiration if fed when not alert. Pt is not likely to be able to maintain nutrtion via PO's alone unless alertness inproves. Discontinue feeding with any s/s of aspiration and notify ST for new eval. ST to f/u 2-3 days in hopes of overall improvement. SLP Visit Diagnosis: Dysphagia, oropharyngeal phase (R13.12)    Aspiration Risk  Moderate aspiration risk    Diet Recommendation Dysphagia 1 (Puree);Honey-thick liquid;Ice chips PRN after oral care   Medication Administration: Via alternative means Supervision: Full supervision/cueing for compensatory strategies Compensations: Slow rate;Small sips/bites Postural Changes: Seated upright at 90 degrees;Remain upright for at least 30 minutes after po intake    Other  Recommendations Oral Care Recommendations: Oral care BID   Follow up Recommendations        Frequency and Duration min 2x/week  1 week       Prognosis Prognosis for Safe Diet Advancement: Fair Barriers to Reach Goals: Cognitive deficits;Behavior;Medication  Swallow Study   General Type of Study: Bedside Swallow Evaluation Diet Prior to this Study: Regular Temperature Spikes Noted: No Respiratory Status: Nasal cannula History of Recent Intubation: Yes Date  extubated: 04/02/17 Behavior/Cognition: Agitated;Confused;Impulsive;Uncooperative;Lethargic/Drowsy Vision: Impaired for self-feeding Patient Positioning: Upright in bed Volitional Cough: Weak    Oral/Motor/Sensory Function     Ice Chips Ice chips: Within functional limits Presentation: Cup;Spoon   Thin Liquid Thin Liquid: Impaired Presentation: Cup;Straw Pharyngeal  Phase Impairments: Throat Clearing - Immediate;Cough - Delayed    Nectar Thick Nectar Thick Liquid: Not tested   Honey Thick Honey Thick Liquid: Within functional limits Presentation: Cup;Spoon   Puree Puree: Within functional limits Presentation: Spoon   Solid   GO   Solid: Not tested        Lucila Maine 04/04/2017,11:29 AM

## 2017-04-04 NOTE — Progress Notes (Signed)
  ST attempt: Chart reviewed. Pt would not awaken with sternum rub for swallow eval. Not appropriate for any Po's at this time secondary to sedation. F/u when appropriate for eval and PO's. Nsg asked to page ST if Pt becomes alert later this am. Continue NPO

## 2017-04-04 NOTE — Progress Notes (Signed)
  [facility] Progess Note    SYNOPSIS   Patient with severe epiglottitis, was intubated now status post extubation. Being monitored closely intensive care unit  ASSESSMENT/PLAN   Patient with epiglottitis. Status post extubation.ENT is following patient. Presently from a respiratory standpoint is doing okay. Stable oxygenation without any evidence of stridor at this time. Rosalie on Decadron and Zosyn  Atrial fibrillation. On amiodarone infusion  Encephalopathy. Has been started on Precedex. Will start on Seroquel and add Ativan when necessary in an attempt to wean Precedex  Renal insufficiency. Increased BUN, creatinine also slight increase, will folow closely  Hyperglycemia. On coverage, will adjust    INTAKE / OUTPUT:  Intake/Output Summary (Last 24 hours) at 04/04/2017 0913 Last data filed at 04/04/2017 0800 Gross per 24 hour  Intake 1570.03 ml  Output 2775 ml  Net -1204.97 ml   DOB: 12-06-1950    ADMISSION DATE:  03/30/2017   SUBJECTIVE:   Patient is presently extubated, has been encephalopathic overnight but has been able to be reoriented. Presently sleeping on Precedex  VITAL SIGNS: Temp:  [97.8 F (36.6 C)-99.9 F (37.7 C)] 99.9 F (37.7 C) (03/09 0800) Pulse Rate:  [62-98] 94 (03/09 0800) Resp:  [20-36] 25 (03/09 0800) BP: (137-179)/(72-112) 172/89 (03/09 0800) SpO2:  [95 %-99 %] 99 % (03/09 0800) Weight:  [129.8 kg (286 lb 2.5 oz)] 129.8 kg (286 lb 2.5 oz) (03/09 0411)   PHYSICAL EXAMINATION: Physical Examination:   VS: BP (!) 172/89 (BP Location: Left Arm)   Pulse 94   Temp 99.9 F (37.7 C) (Oral)   Resp (!) 25   Ht 5' 8"  (1.727 m)   Wt 129.8 kg (286 lb 2.5 oz)   SpO2 99%   BMI 43.51 kg/m   General Appearance: No distress, sedated on Precedex Neuro:as been encephalopathic overnight, presently on Precedex and sedated HEENT: trachea midline, no stridor appreciated Pulmonary: normal breath sounds   Cardiovascular pacer  spikes noted, regular rhythm.   Abdomen: Benign exam Skin:   warm, no rashes, no ecchymosis  Extremities: normal, no cyanosis, clubbing.  LABORATORY PANEL:   CBC Recent Labs  Lab 04/04/17 0536  WBC 7.2  HGB 14.1  HCT 42.2  PLT 159    Chemistries  Recent Labs  Lab 04/04/17 0536  NA 141  K 4.4  CL 102  CO2 29  GLUCOSE 344*  BUN 45*  CREATININE 1.46*  CALCIUM 8.6*  MG 2.2  PHOS 2.7  AST 77*  ALT 50  ALKPHOS 58  BILITOT 1.2    Recent Labs  Lab 04/03/17 1604 04/03/17 1930 04/03/17 2108 04/03/17 2358 04/04/17 0402 04/04/17 0744  GLUCAP 147* 326* 329* 372* 323* 305*   Recent Labs  Lab 03/30/17 1550  PHART 7.35  PCO2ART 46  PO2ART 96   Recent Labs  Lab 03/30/17 0538 04/04/17 0536  AST 45* 77*  ALT 19 50  ALKPHOS 99 58  BILITOT 0.9 1.2  ALBUMIN 3.2* 2.9*    Cardiac Enzymes Recent Labs  Lab 04/03/17 1500  TROPONINI <0.03    RADIOLOGY:  No results found. Hermelinda Dellen, DO  04/04/2017

## 2017-04-04 NOTE — Progress Notes (Signed)
Patient is very agitated, hollering out and cussing staff.Precedex is titrated to the max per order. Pt is confused. Per report son is able to help calm the patient. Attempted to notify son, no answer and call went directly to voicemail. No message left.

## 2017-04-04 NOTE — Progress Notes (Signed)
PT Cancellation Note  Patient Details Name: Christian Berg MRN: [id number] DOB: 31-May-1950   Cancelled Treatment:    Reason Eval/Treat Not Completed: Medical issues which prohibited therapy Spoke with nursing earlier who reports that pt was sedated and going for a test, but that may be able to work with PT later.  Upon checking most recent vitals his BP is elevated, HR is elevated, respiratory rate is elevated and he is not appropriate for PT exam at this time.  Will try back tomorrow as appropriate.   Kreg Shropshire, DPT 04/04/2017, 4:51 PM

## 2017-04-04 NOTE — Progress Notes (Signed)
[name] for Warfarin Indication: atrial fibrillation  Allergies  Allergen Reactions  . Clindamycin/Lincomycin     Chest discomfort  . Sulfa Antibiotics Hives  . Sulfasalazine Hives    Patient Measurements: Height: 5' 8"  (172.7 cm) Weight: 286 lb 2.5 oz (129.8 kg) IBW/kg (Calculated) : 68.4   Vital Signs: Temp: 99.9 F (37.7 C) (03/09 0800) Temp Source: Oral (03/09 0800) BP: 153/99 (03/09 1100) Pulse Rate: 79 (03/09 1100)  Labs: Recent Labs    04/02/17 0415 04/03/17 0110 04/03/17 0411 04/03/17 0647 04/03/17 1500 04/04/17 0536  HGB  --   --   --   --   --  14.1  HCT  --   --   --   --   --  42.2  PLT  --   --   --   --   --  159  LABPROT 21.4*  --  19.6*  --   --  18.4*  INR 1.87  --  1.67  --   --  1.54  CREATININE 1.33*  --  1.47*  --   --  1.46*  TROPONINI  --  <0.03  --  <0.03 <0.03  --     Estimated Creatinine Clearance: 63.7 mL/min (A) (by C-G formula based on SCr of 1.46 mg/dL (H)).   Medical History: Past Medical History:  Diagnosis Date  . A-fib ([place])   . CHF (congestive heart failure) ([place])   . Degenerative disc disease, lumbar    s/p injury  . Diabetes mellitus without complication ([place])   . Disseminated superficial actinic porokeratosis   . Dysrhythmia    A-FIB, palpatations  . Hypercholesteremia   . Hypertension   . Kidney stones   . Peripheral vascular disease ([place])    Carotid stenosis  . Presence of permanent cardiac pacemaker    Inactive  . TIA (transient ischemic attack) 2011   No deficits    Assessment: 67 y/o M with a h/o atrial fibrillation on warfarin 7.5 mg daily PTA admitted with epiglottitis and intubated. Vitamin K and KCentra ordered with elevated INR for possible trach but not administered.   Patient extubated 3/7 and INR < 2.   3/8  INR 1.67  Unable to take PO 3/9  INR 1.54  Unable to take PO  Goal of Therapy:  INR 2-3   Plan:  Continue Lovenox for DVT prophylaxis and  resume warfarin when patient is able to take oral medications.   Christian Berg A 04/04/2017,12:06 PM

## 2017-04-05 DIAGNOSIS — R410 Disorientation, unspecified: Secondary | ICD-10-CM

## 2017-04-05 LAB — BASIC METABOLIC PANEL
Anion gap: 8 (ref 5–15)
BUN: 42 mg/dL — ABNORMAL HIGH (ref 6–20)
CO2: 28 mmol/L (ref 22–32)
Calcium: 8.6 mg/dL — ABNORMAL LOW (ref 8.9–10.3)
Chloride: 102 mmol/L (ref 101–111)
Creatinine, Ser: 1.32 mg/dL — ABNORMAL HIGH (ref 0.61–1.24)
GFR calc Af Amer: >60 mL/min (ref >60)
GFR calc non Af Amer: 54 mL/min — ABNORMAL LOW (ref >60)
Glucose, Bld: 271 mg/dL — ABNORMAL HIGH (ref 65–99)
Potassium: 4.6 mmol/L (ref 3.5–5.1)
Sodium: 138 mmol/L (ref 135–145)

## 2017-04-05 LAB — GLUCOSE, CAPILLARY
Glucose-Capillary: 228 mg/dL — ABNORMAL HIGH (ref 65–99)
Glucose-Capillary: 244 mg/dL — ABNORMAL HIGH (ref 65–99)
Glucose-Capillary: 249 mg/dL — ABNORMAL HIGH (ref 65–99)
Glucose-Capillary: 257 mg/dL — ABNORMAL HIGH (ref 65–99)
Glucose-Capillary: 262 mg/dL — ABNORMAL HIGH (ref 65–99)
Glucose-Capillary: 311 mg/dL — ABNORMAL HIGH (ref 65–99)

## 2017-04-05 LAB — PROTIME-INR
INR: 1.47
Prothrombin Time: 17.7 seconds — ABNORMAL HIGH (ref 11.4–15.2)

## 2017-04-05 MED ORDER — AMIODARONE HCL 200 MG PO TABS
200.0000 mg | ORAL_TABLET | Freq: Every day | ORAL | Status: DC
  Administered 2017-04-05 – 2017-04-10 (×6): 200 mg via ORAL
  Filled 2017-04-05 (×6): qty 1

## 2017-04-05 MED ORDER — ACETAMINOPHEN 325 MG PO TABS
ORAL_TABLET | ORAL | Status: AC
  Administered 2017-04-05: 325 mg
  Filled 2017-04-05: qty 2

## 2017-04-05 MED ORDER — TRAMADOL-ACETAMINOPHEN 37.5-325 MG PO TABS
2.0000 | ORAL_TABLET | Freq: Four times a day (QID) | ORAL | Status: DC
  Administered 2017-04-05 – 2017-04-10 (×19): 2 via ORAL
  Filled 2017-04-05 (×20): qty 2

## 2017-04-05 MED ORDER — PANTOPRAZOLE SODIUM 20 MG PO TBEC: 20.0000 mg | DELAYED_RELEASE_TABLET | Freq: Every day | ORAL | Status: DC

## 2017-04-05 MED ORDER — LOPERAMIDE HCL 2 MG PO CAPS
2.0000 mg | ORAL_CAPSULE | Freq: Four times a day (QID) | ORAL | Status: DC | PRN
  Filled 2017-04-05: qty 1

## 2017-04-05 MED ORDER — METOPROLOL TARTRATE 50 MG PO TABS
100.0000 mg | ORAL_TABLET | Freq: Every day | ORAL | Status: DC
  Administered 2017-04-05 – 2017-04-09 (×4): 100 mg via ORAL
  Filled 2017-04-05 (×5): qty 2

## 2017-04-05 MED ORDER — MAGIC MOUTHWASH
10.0000 mL | Freq: Three times a day (TID) | ORAL | Status: DC
  Administered 2017-04-05 – 2017-04-09 (×9): 10 mL via ORAL
  Filled 2017-04-05 (×14): qty 10

## 2017-04-05 MED ORDER — WARFARIN - PHARMACIST DOSING INPATIENT
Freq: Every day | Status: DC
  Administered 2017-04-06 – 2017-04-08 (×3)

## 2017-04-05 MED ORDER — INSULIN ASPART 100 UNIT/ML ~~LOC~~ SOLN: 0.0000 [IU] | Freq: Three times a day (TID) | SUBCUTANEOUS | Status: DC

## 2017-04-05 MED ORDER — INSULIN ASPART 100 UNIT/ML ~~LOC~~ SOLN
0.0000 [IU] | Freq: Three times a day (TID) | SUBCUTANEOUS | Status: DC
  Administered 2017-04-05: 11 [IU] via SUBCUTANEOUS
  Administered 2017-04-06: 7 [IU] via SUBCUTANEOUS
  Administered 2017-04-06: 11 [IU] via SUBCUTANEOUS
  Administered 2017-04-06: 7 [IU] via SUBCUTANEOUS
  Administered 2017-04-07: 4 [IU] via SUBCUTANEOUS
  Administered 2017-04-07: 7 [IU] via SUBCUTANEOUS
  Administered 2017-04-07 – 2017-04-08 (×2): 4 [IU] via SUBCUTANEOUS
  Administered 2017-04-08: 7 [IU] via SUBCUTANEOUS
  Administered 2017-04-09 – 2017-04-10 (×2): 4 [IU] via SUBCUTANEOUS
  Administered 2017-04-10: 7 [IU] via SUBCUTANEOUS
  Filled 2017-04-05 (×12): qty 1

## 2017-04-05 MED ORDER — INSULIN ASPART 100 UNIT/ML ~~LOC~~ SOLN
0.0000 [IU] | Freq: Every day | SUBCUTANEOUS | Status: DC
  Administered 2017-04-05 – 2017-04-06 (×2): 2 [IU] via SUBCUTANEOUS
  Administered 2017-04-09: 4 [IU] via SUBCUTANEOUS
  Filled 2017-04-05 (×3): qty 1

## 2017-04-05 MED ORDER — MORPHINE SULFATE (PF) 2 MG/ML IV SOLN
2.0000 mg | INTRAVENOUS | Status: DC | PRN
  Administered 2017-04-05 – 2017-04-06 (×3): 2 mg via INTRAVENOUS
  Filled 2017-04-05 (×3): qty 1

## 2017-04-05 MED ORDER — QUETIAPINE FUMARATE 25 MG PO TABS
25.0000 mg | ORAL_TABLET | Freq: Every day | ORAL | Status: DC
  Administered 2017-04-05 – 2017-04-06 (×2): 25 mg via ORAL
  Filled 2017-04-05 (×2): qty 1

## 2017-04-05 MED ORDER — CYCLOBENZAPRINE HCL 10 MG PO TABS
5.0000 mg | ORAL_TABLET | Freq: Every day | ORAL | Status: DC
  Administered 2017-04-05 – 2017-04-09 (×5): 5 mg via ORAL
  Filled 2017-04-05 (×5): qty 1

## 2017-04-05 MED ORDER — ORAL CARE MOUTH RINSE
15.0000 mL | Freq: Two times a day (BID) | OROMUCOSAL | Status: DC
  Administered 2017-04-05 – 2017-04-09 (×4): 15 mL via OROMUCOSAL

## 2017-04-05 MED ORDER — PRAVASTATIN SODIUM 20 MG PO TABS
10.0000 mg | ORAL_TABLET | Freq: Every day | ORAL | Status: DC
  Administered 2017-04-05 – 2017-04-09 (×5): 10 mg via ORAL
  Filled 2017-04-05 (×5): qty 1

## 2017-04-05 MED ORDER — GLIPIZIDE 10 MG PO TABS
10.0000 mg | ORAL_TABLET | Freq: Two times a day (BID) | ORAL | Status: DC
  Administered 2017-04-05 – 2017-04-10 (×8): 10 mg via ORAL
  Filled 2017-04-05 (×11): qty 1

## 2017-04-05 MED ORDER — ASPIRIN EC 81 MG PO TBEC
81.0000 mg | DELAYED_RELEASE_TABLET | Freq: Every evening | ORAL | Status: DC
  Administered 2017-04-05 – 2017-04-09 (×5): 81 mg via ORAL
  Filled 2017-04-05 (×5): qty 1

## 2017-04-05 MED ORDER — WARFARIN SODIUM 3 MG PO TABS
3.0000 mg | ORAL_TABLET | Freq: Once | ORAL | Status: AC
  Administered 2017-04-05: 3 mg via ORAL
  Filled 2017-04-05: qty 1

## 2017-04-05 MED ORDER — ACETAMINOPHEN 325 MG PO TABS: 650.0000 mg | ORAL_TABLET | Freq: Four times a day (QID) | ORAL | Status: DC | PRN

## 2017-04-05 MED ORDER — METFORMIN HCL 850 MG PO TABS
850.0000 mg | ORAL_TABLET | Freq: Two times a day (BID) | ORAL | Status: DC
  Administered 2017-04-05 – 2017-04-10 (×8): 850 mg via ORAL
  Filled 2017-04-05 (×11): qty 1

## 2017-04-05 MED ORDER — LISINOPRIL 20 MG PO TABS
20.0000 mg | ORAL_TABLET | Freq: Every day | ORAL | Status: DC
  Administered 2017-04-05 – 2017-04-10 (×6): 20 mg via ORAL
  Filled 2017-04-05 (×6): qty 1

## 2017-04-05 MED ORDER — DILTIAZEM HCL 30 MG PO TABS
60.0000 mg | ORAL_TABLET | Freq: Three times a day (TID) | ORAL | Status: DC
  Administered 2017-04-05 – 2017-04-08 (×10): 60 mg via ORAL
  Filled 2017-04-05 (×10): qty 2

## 2017-04-05 NOTE — H&P (Signed)
[facility] [place] NAME: Christian Berg    MR#:  [id number]  DATE OF BIRTH:  Mar 21, 1950  DATE OF ADMISSION:  03/30/2017  PRIMARY CARE PHYSICIAN: [name], PA   REQUESTING/REFERRING PHYSICIAN: Hermelinda Dellen, DO  CHIEF COMPLAINT:   Chief Complaint  Patient presents with  . Oral Swelling    Assumption of care   HISTORY OF PRESENT ILLNESS: Christian Berg  is a 67 y.o. male with a known history of atrial fibrillation, congestive heart failure, diabetes type 2, atrial fibrillation, hyperlipidemia, essential hypertension, peripheral vascular disease as well as presence of inactive permanent cardiac pacemaker it has no battery and there who was admitted from the ER by the ENT physicians after he presented with epiglottitis and required emergent intubation by ENT.  Patient was in the ICU subsequently extubated.  He has been agitated and has thought to have acute delirium.  Patient continues to be confused and agitated.  Currently asking for his tramadol for his back pain.  His son and caregiver are at the bedside.  They report that he normally is not like this.  He does have some forgetfulness.  But is not agitated.Christian Berg PAST MEDICAL HISTORY:   Past Medical History:  Diagnosis Date  . A-fib ([place])   . CHF (congestive heart failure) ([place])   . Degenerative disc disease, lumbar    s/p injury  . Diabetes mellitus without complication ([place])   . Disseminated superficial actinic porokeratosis   . Dysrhythmia    A-FIB, palpatations  . Hypercholesteremia   . Hypertension   . Kidney stones   . Peripheral vascular disease ([place])    Carotid stenosis  . Presence of permanent cardiac pacemaker    Inactive  . TIA (transient ischemic attack) 2011   No deficits    PAST SURGICAL HISTORY:  Past Surgical History:  Procedure Laterality Date  . APPENDECTOMY  2004  . CARDIAC CATHETERIZATION    . CARDIAC PACEMAKER PLACEMENT  2005  . CATARACT EXTRACTION  W/PHACO Right 06/14/2014   Procedure: CATARACT EXTRACTION PHACO AND INTRAOCULAR LENS PLACEMENT (IOC);  Surgeon: Leandrew Koyanagi, MD;  Location: [facility];  Service: Ophthalmology;  Laterality: Right;  . CYSTOSCOPY W/ RETROGRADES Bilateral 08/09/2014   Procedure: CYSTOSCOPY WITH RETROGRADE PYELOGRAM;  Surgeon: Hollice Espy, MD;  Location: [facility];  Service: Urology;  Laterality: Bilateral;  . INTUBATION-ENDOTRACHEAL WITH TRACHEOSTOMY STANDBY  03/30/2017   Procedure: INTUBATION-ENDOTRACHEAL WITH TRACHEOSTOMY STANDBY;  Surgeon: Carloyn Manner, MD;  Location: [facility];  Service: ENT;;    SOCIAL HISTORY:  Social History   Tobacco Use  . Smoking status: Never Smoker  . Smokeless tobacco: Never Used  Substance Use Topics  . Alcohol use: No    Alcohol/week: 0.0 oz    FAMILY HISTORY:  Family History  Adopted: Yes  Problem Relation Age of Onset  . Heart disease Mother   . Fibromyalgia Sister   . Hypertension Daughter   . Migraines Daughter   . Kidney disease Neg Hx   . Prostate cancer Neg Hx   . Bladder Cancer Neg Hx     DRUG ALLERGIES:  Allergies  Allergen Reactions  . Clindamycin/Lincomycin     Chest discomfort  . Sulfa Antibiotics Hives  . Sulfasalazine Hives    REVIEW OF SYSTEMS:   CONSTITUTIONAL: Confused    MEDICATIONS AT HOME:  Prior to Admission medications   Medication Sig Start Date End Date Taking? Authorizing Provider  aspirin EC 81 MG tablet Take 81 mg  by mouth every evening.    Yes [provider]  cyclobenzaprine (FLEXERIL) 5 MG tablet Take 5 mg by mouth at bedtime for spasms.   Yes [provider]  glipiZIDE (GLUCOTROL) 10 MG tablet Take 1 tablet (10 mg total) by mouth 2 (two) times daily before a meal. Take 30 minutes before a meal 10/06/16  Yes [name], PA  lansoprazole (PREVACID) 15 MG capsule Take 15-30 mg by mouth daily as needed. For heartburn/reflux   Yes [provider]  lisinopril (PRINIVIL,ZESTRIL)  20 MG tablet TAKE ONE TABLET BY MOUTH ONCE DAILY 11/13/16  Yes [name], PA  lovastatin (MEVACOR) 10 MG tablet Take 2 tablets (20 mg total) by mouth daily. Patient taking differently: Take 20 mg by mouth at bedtime.  08/08/16  Yes [name], PA  metFORMIN (GLUCOPHAGE) 850 MG tablet TAKE 1 TABLET BY MOUTH TWICE DAILY WITH MEALS 02/02/17  Yes [name], PA  metoprolol tartrate (LOPRESSOR) 100 MG tablet Take 1 tablet (100 mg total) by mouth at bedtime. Patient taking differently: Take 200 mg by mouth 2 (two) times daily.  10/24/16  Yes Demetrios Loll, MD  nystatin cream (MYCOSTATIN) Apply 1 application topically 2 (two) times daily. Patient taking differently: Apply 1 application topically 2 (two) times daily as needed for dry skin.  07/17/14  Yes McGowan, Larene Beach A, PA-C  traMADol-acetaminophen (ULTRACET) 37.5-325 MG tablet Take 2 tablets by mouth 4 (four) times daily.    Yes [provider]  warfarin (COUMADIN) 7.5 MG tablet Take 1 tablet (7.5 mg total) by mouth daily. 08/08/16  Yes [name], PA  diltiazem (CARDIZEM) 60 MG tablet Take 1 tablet (60 mg total) by mouth every 8 (eight) hours. Patient not taking: Reported on 03/30/2017 09/19/15   Lisette Abu, PA-C  doxycycline (VIBRA-TABS) 100 MG tablet Take 1 tablet (100 mg total) by mouth 2 (two) times daily. Patient not taking: Reported on 03/30/2017 03/05/17   [name], PA  gabapentin (NEURONTIN) 300 MG capsule gabapentin 300 mg capsule  Take 1 PO QHS X 5D then 1PO BID X5D, 1 PO Q8    [provider]  oseltamivir (TAMIFLU) 75 MG capsule Take 1 capsule (75 mg total) by mouth 2 (two) times daily. Patient not taking: Reported on 03/30/2017 02/20/17   [name], PA      PHYSICAL EXAMINATION:   VITAL SIGNS: Blood pressure 126/71, pulse 78, temperature 98.1 F (36.7 C), temperature source Oral, resp. rate 14, height 5' 8"  (1.727 m), weight 286 lb 2.5 oz (129.8 kg), SpO2 97 %.  GENERAL:  67 y.o.-year-old  patient lying in the bed with no acute distress.  EYES: Pupils equal, round, reactive to light and accommodation. No scleral icterus. Extraocular muscles intact.  HEENT: Head atraumatic, normocephalic. Oropharynx and nasopharynx clear. + exuduate NECK:  Supple, no jugular venous distention. No thyroid enlargement, no tenderness.  LUNGS: Normal breath sounds bilaterally, no wheezing, rales,rhonchi or crepitation. No use of accessory muscles of respiration.  CARDIOVASCULAR: S1, S2 normal. No murmurs, rubs, or gallops.  ABDOMEN: Soft, nontender, nondistended. Bowel sounds present. No organomegaly or mass.  EXTREMITIES: No pedal edema, cyanosis, or clubbing.  NEUROLOGIC: Confused PSYCHIATRIC: The patient is alert , confused SKIN: No obvious rash, lesion, or ulcer.   LABORATORY PANEL:   CBC Recent Labs  Lab 03/30/17 0538 04/04/17 0536  WBC 7.6 7.2  HGB 13.5 14.1  HCT 38.9* 42.2  PLT 192 159  MCV 99.7 100.8*  MCH 34.7* 33.7  MCHC 34.8 33.4  RDW 14.1 14.3  LYMPHSABS 1.2  --   MONOABS 0.4  --   EOSABS 0.2  --   BASOSABS 0.0  --    ------------------------------------------------------------------------------------------------------------------  Chemistries  Recent Labs  Lab 03/30/17 0538  04/01/17 0521 04/02/17 0415 04/03/17 0411 04/04/17 0536 04/05/17 0520  NA 139   < > 141 145 142 141 138  K 4.0   < > 4.2 4.3 3.8 4.4 4.6  CL 105   < > 108 108 101 102 102  CO2 25   < > 24 29 30 29 28   GLUCOSE 205*   < > 298* 216* 197* 344* 271*  BUN 9   < > 28* 34* 34* 45* 42*  CREATININE 1.41*   < > 1.44* 1.33* 1.47* 1.46* 1.32*  CALCIUM 9.0   < > 8.6* 8.5* 8.5* 8.6* 8.6*  MG  --   --   --   --   --  2.2  --   AST 45*  --   --   --   --  77*  --   ALT 19  --   --   --   --  50  --   ALKPHOS 99  --   --   --   --  58  --   BILITOT 0.9  --   --   --   --  1.2  --    < > = values in this interval not displayed.    ------------------------------------------------------------------------------------------------------------------ estimated creatinine clearance is 70.5 mL/min (A) (by C-G formula based on SCr of 1.32 mg/dL (H)). ------------------------------------------------------------------------------------------------------------------ No results for input(s): TSH, T4TOTAL, T3FREE, THYROIDAB in the last 72 hours.  Invalid input(s): FREET3   Coagulation profile Recent Labs  Lab 04/01/17 0521 04/02/17 0415 04/03/17 0411 04/04/17 0536 04/05/17 0520  INR 2.24 1.87 1.67 1.54 1.47   ------------------------------------------------------------------------------------------------------------------- No results for input(s): DDIMER in the last 72 hours. -------------------------------------------------------------------------------------------------------------------  Cardiac Enzymes Recent Labs  Lab 04/03/17 0110 04/03/17 0647 04/03/17 1500  TROPONINI <0.03 <0.03 <0.03   ------------------------------------------------------------------------------------------------------------------ Invalid input(s): POCBNP  ---------------------------------------------------------------------------------------------------------------  Urinalysis    Component Value Date/Time   COLORURINE YELLOW (A) 11/11/2014 1859   APPEARANCEUR CLEAR (A) 11/11/2014 1859   APPEARANCEUR Clear 07/21/2014 1239   LABSPEC 1.006 11/11/2014 1859   LABSPEC 1.041 09/14/2013 1658   PHURINE 5.0 11/11/2014 1859   GLUCOSEU NEGATIVE 11/11/2014 1859   GLUCOSEU 50 mg/dL 09/14/2013 1658   HGBUR NEGATIVE 11/11/2014 1859   BILIRUBINUR negative 03/05/2017 1132   BILIRUBINUR Negative 07/21/2014 1239   BILIRUBINUR Negative 09/14/2013 1658   KETONESUR NEGATIVE 11/11/2014 1859   PROTEINUR trace 03/05/2017 1132   PROTEINUR NEGATIVE 11/11/2014 1859   UROBILINOGEN 0.2 03/05/2017 1132   NITRITE positive 03/05/2017 1132   NITRITE  NEGATIVE 11/11/2014 1859   LEUKOCYTESUR Large (3+) (A) 03/05/2017 1132   LEUKOCYTESUR Negative 07/21/2014 1239   LEUKOCYTESUR Negative 09/14/2013 1658     RADIOLOGY: Ct Head Wo Contrast  Result Date: 04/04/2017 CLINICAL DATA:  Altered mental status, worsening over the last 2 days. EXAM: CT HEAD WITHOUT CONTRAST TECHNIQUE: Contiguous axial images were obtained from the base of the skull through the vertex without intravenous contrast. COMPARISON:  Head CT dated 09/15/2015.  Head CT dated 07/11/2015. FINDINGS: Brain: Generalized parenchymal atrophy with commensurate dilatation of the ventricles and sulci. No hydrocephalus. 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 No mass, hemorrhage, edema or other evidence of acute parenchymal abnormality. Vascular: There are chronic calcified atherosclerotic  changes of the large vessels at the skull base. No unexpected hyperdense vessel. Skull: Normal. Negative for fracture or focal lesion. Sinuses/Orbits: No acute finding. Other: None. IMPRESSION: 1. No acute findings.  No intracranial mass, hemorrhage or edema. 2. 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 Electronically Signed   By: Franki Cabot M.D.   On: 04/04/2017 15:05    EKG: Orders placed or performed during the hospital encounter of 03/30/17  . EKG 12-Lead  . EKG 12-Lead  . EKG 12-Lead  . EKG 12-Lead    IMPRESSION AND PLAN: Patient is a 67 year old morbidly obese male with admitted with epiglottitis  1.  Acute epiglottitis continue Zosyn for now I will add some mouthwash for his mouth  2.  Acute delirium Could be related to just acute hospitalization and his age Patient also on chronic tramadol I will resume may be playing a part in this as well Continue Seroquel at bedtime  3.  Diabetes type 2 Continue sliding scale insulin Continue Levemir Continue metformin, if creatinine worsens discontinue glipizide Continue glipizide  4.  History  of atrial fibrillation continue metoprolol and Coumadin  5.  Essential hypertension continue metoprolol Lisinopril  6.  History of CHF currently appears compensated   All the records are reviewed and case discussed with ED provider. Management plans discussed with the patient, family and they are in agreement.  CODE STATUS:    Code Status Orders  (From admission, onward)        Start     Ordered   03/30/17 0925  Full code  Continuous     03/30/17 0924    Code Status History    Date Active Date Inactive Code Status Order ID Comments User Context   10/22/2016 13:40 10/24/2016 17:09 Full Code [id number]  Gladstone Lighter, MD Inpatient   09/16/2015 06:40 09/19/2015 16:44 Full Code [id number]  Donnie Mesa, MD Inpatient   11/11/2014 22:56 11/12/2014 15:10 Full Code [id number]  Lance Coon, MD Inpatient   10/25/2014 09:23 10/26/2014 03:28 Full Code [id number]  Sabino Dick, MD [place]   08/07/2014 18:07 08/09/2014 19:27 Full Code [id number]  Epifanio Lesches, MD ED    Advance Directive Documentation     Most Recent Value  Type of Advance Directive  Living will, Healthcare Power of Attorney  Pre-existing out of facility DNR order (yellow form or pink MOST form)  No data  "MOST" Form in Place?  No data       TOTAL TIME TAKING CARE OF THIS PATIENT: 71mnutes.    SDustin FlockM.D on 04/05/2017 at 2:55 PM  Between 7am to 6pm - Pager - [phone]  After 6pm go to [email or web address] - [email or web address]  [phone] CC: Primary care physician; CCarmon Ginsberg PUtah

## 2017-04-05 NOTE — Progress Notes (Signed)
Pt complaining of tooth, back, and head pain. Complaining that no one is helping him and trying to kill him. Demanding to see a doctor. This RN, NT Olivia Mackie, and Network engineer explained to patient that he needed to be patient and remain calm when talking to nursing staff. Multiple staff have been in the room multiple times to assist the patient with his needs. All night time meds given, including scheduled pain medicine. Given 62m ativan IV for agitation. Nursing staff will continue to monitor for any changes in patient status. KEarleen Reaper RN

## 2017-04-05 NOTE — Consult Note (Signed)
Reason for Consult:agitation  Referring Physician: Dr. Jefferson Fuel   CC: agitation   HPI: Christian Berg is an 67 y.o. male with epiglotitis with respiratory distress s/p extubation.  Pt has been confused and disoriented.   Past Medical History:  Diagnosis Date  . A-fib ([place])   . CHF (congestive heart failure) ([place])   . Degenerative disc disease, lumbar    s/p injury  . Diabetes mellitus without complication ([place])   . Disseminated superficial actinic porokeratosis   . Dysrhythmia    A-FIB, palpatations  . Hypercholesteremia   . Hypertension   . Kidney stones   . Peripheral vascular disease ([place])    Carotid stenosis  . Presence of permanent cardiac pacemaker    Inactive  . TIA (transient ischemic attack) 2011   No deficits    Past Surgical History:  Procedure Laterality Date  . APPENDECTOMY  2004  . CARDIAC CATHETERIZATION    . CARDIAC PACEMAKER PLACEMENT  2005  . CATARACT EXTRACTION W/PHACO Right 06/14/2014   Procedure: CATARACT EXTRACTION PHACO AND INTRAOCULAR LENS PLACEMENT (IOC);  Surgeon: Leandrew Koyanagi, MD;  Location: [facility];  Service: Ophthalmology;  Laterality: Right;  . CYSTOSCOPY W/ RETROGRADES Bilateral 08/09/2014   Procedure: CYSTOSCOPY WITH RETROGRADE PYELOGRAM;  Surgeon: Hollice Espy, MD;  Location: [facility];  Service: Urology;  Laterality: Bilateral;  . INTUBATION-ENDOTRACHEAL WITH TRACHEOSTOMY STANDBY  03/30/2017   Procedure: INTUBATION-ENDOTRACHEAL WITH TRACHEOSTOMY STANDBY;  Surgeon: Carloyn Manner, MD;  Location: [facility];  Service: ENT;;    Family History  Adopted: Yes  Problem Relation Age of Onset  . Heart disease Mother   . Fibromyalgia Sister   . Hypertension Daughter   . Migraines Daughter   . Kidney disease Neg Hx   . Prostate cancer Neg Hx   . Bladder Cancer Neg Hx     Social History:  reports that  has never smoked. he has never used smokeless tobacco. He reports that he does not drink alcohol or use drugs.  Allergies   Allergen Reactions  . Clindamycin/Lincomycin     Chest discomfort  . Sulfa Antibiotics Hives  . Sulfasalazine Hives    Medications: I have reviewed the patient's current medications.  ROS: Unable to obtain due to confusion   Physical Examination: Blood pressure 131/80, pulse 81, temperature 98.3 F (36.8 C), temperature source Oral, resp. rate (!) 21, height 5' 8"  (1.727 m), weight 286 lb 2.5 oz (129.8 kg), SpO2 99 %.    Neurological Examination   Mental Status: Alert to name only.  Cranial Nerves: II: Discs flat bilaterally; Visual fields grossly normal, pupils equal, round, reactive to light and accommodation III,IV, VI: ptosis not present, extra-ocular motions intact bilaterally V,VII: smile symmetric, facial light touch sensation normal bilaterally VIII: hearing normal bilaterally IX,X: gag reflex present XI: bilateral shoulder shrug XII: midline tongue extension Motor: Right : Upper extremity   5/5    Left:     Upper extremity   5/5  Lower extremity   5/5     Lower extremity   5/5 Tone and bulk:normal tone throughout; no atrophy noted Sensory: Pinprick and light touch intact throughout, bilaterally Deep Tendon Reflexes: 1+ and symmetric throughout Plantars: Right: downgoing   Left: downgoing Cerebellar: Not tested Gait: not tested      Laboratory Studies:   Basic Metabolic Panel: Recent Labs  Lab 04/01/17 0521 04/02/17 0415 04/03/17 0411 04/04/17 0536 04/05/17 0520  NA 141 145 142 141 138  K 4.2 4.3 3.8 4.4 4.6  CL  108 108 101 102 102  CO2 24 29 30 29 28   GLUCOSE 298* 216* 197* 344* 271*  BUN 28* 34* 34* 45* 42*  CREATININE 1.44* 1.33* 1.47* 1.46* 1.32*  CALCIUM 8.6* 8.5* 8.5* 8.6* 8.6*  MG  --   --   --  2.2  --   PHOS  --   --   --  2.7  --     Liver Function Tests: Recent Labs  Lab 03/30/17 0538 04/04/17 0536  AST 45* 77*  ALT 19 50  ALKPHOS 99 58  BILITOT 0.9 1.2  PROT 7.9 7.0  ALBUMIN 3.2* 2.9*   No results for input(s): LIPASE,  AMYLASE in the last 168 hours. No results for input(s): AMMONIA in the last 168 hours.  CBC: Recent Labs  Lab 03/30/17 0538 04/04/17 0536  WBC 7.6 7.2  NEUTROABS 5.7  --   HGB 13.5 14.1  HCT 38.9* 42.2  MCV 99.7 100.8*  PLT 192 159    Cardiac Enzymes: Recent Labs  Lab 04/03/17 0110 04/03/17 0647 04/03/17 1500  TROPONINI <0.03 <0.03 <0.03    BNP: Invalid input(s): POCBNP  CBG: Recent Labs  Lab 04/04/17 2212 04/05/17 0012 04/05/17 0403 04/05/17 0748 04/05/17 1121  GLUCAP 207* 244* 262* 249* 311*    Microbiology: Results for orders placed or performed during the hospital encounter of 03/30/17  Group A Strep by PCR ([place] Only)     Status: None   Collection Time: 03/30/17  5:38 AM  Result Value Ref Range Status   Group A Strep by PCR NOT DETECTED NOT DETECTED Final    Comment: Performed at [facility], [address] [place], [place] [address]  MRSA PCR Screening     Status: None   Collection Time: 03/30/17  9:23 AM  Result Value Ref Range Status   MRSA by PCR NEGATIVE NEGATIVE Final    Comment:        The GeneXpert MRSA Assay (FDA approved for NASAL specimens only), is one component of a comprehensive MRSA colonization surveillance program. It is not intended to diagnose MRSA infection nor to guide or monitor treatment for MRSA infections. Performed at [facility], [address] [place], [place] [address]     Coagulation Studies: Recent Labs    04/03/17 0411 04/04/17 0536 04/05/17 0520  LABPROT 19.6* 18.4* 17.7*  INR 1.67 1.54 1.47    Urinalysis: No results for input(s): COLORURINE, LABSPEC, PHURINE, GLUCOSEU, HGBUR, BILIRUBINUR, KETONESUR, PROTEINUR, UROBILINOGEN, NITRITE, LEUKOCYTESUR in the last 168 hours.  Invalid input(s): APPERANCEUR  Lipid Panel:     Component Value Date/Time   CHOL 105 05/24/2015 1058   TRIG 136 05/24/2015 1058   HDL 36 (L) 05/24/2015 1058   CHOLHDL 2.9 05/24/2015 1058   LDLCALC 42  05/24/2015 1058    HgbA1C:  Lab Results  Component Value Date   HGBA1C 8.6 (H) 04/01/2017    Urine Drug Screen:  No results found for: LABOPIA, COCAINSCRNUR, LABBENZ, AMPHETMU, THCU, LABBARB  Alcohol Level: No results for input(s): ETH in the last 168 hours.  Other results: EKG: normal EKG, normal sinus rhythm, unchanged from previous tracings.  Imaging: Ct Head Wo Contrast  Result Date: 04/04/2017 CLINICAL DATA:  Altered mental status, worsening over the last 2 days. EXAM: CT HEAD WITHOUT CONTRAST TECHNIQUE: Contiguous axial images were obtained from the base of the skull through the vertex without intravenous contrast. COMPARISON:  Head CT dated 09/15/2015.  Head CT dated 07/11/2015. FINDINGS: Brain: Generalized parenchymal atrophy  with commensurate dilatation of the ventricles and sulci. No hydrocephalus. 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 No mass, hemorrhage, edema or other evidence of acute parenchymal abnormality. Vascular: There are chronic calcified atherosclerotic changes of the large vessels at the skull base. No unexpected hyperdense vessel. Skull: Normal. Negative for fracture or focal lesion. Sinuses/Orbits: No acute finding. Other: None. IMPRESSION: 1. No acute findings.  No intracranial mass, hemorrhage or edema. 2. 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 Electronically Signed   By: Franki Cabot M.D.   On: 04/04/2017 15:05     Assessment/Plan:   67 y.o. male with epiglotitis with respiratory distress s/p extubation.  Pt has been confused and disoriented.   Patient is clearly delirious which is seen in up to 80% of pts that are admitted to the ICU Sleep and wake cycle are important Asked family to try to keep pt awake during the day with lights on Out of bed to chair Seroquel if needed Hold off any further imgaing D/w family at bedside.  04/05/2017, 1:26 PM

## 2017-04-05 NOTE — Progress Notes (Signed)
[facility] Progess Note    SYNOPSIS   Patient with severe epiglottitis, was intubated now status post extubation. Being monitored closely intensive care unit  ASSESSMENT/PLAN   Patient with epiglottitis. Presently from a respiratory standpoint is doing okay. Stable oxygenation without any evidence of stridor at this time.   Atrial fibrillation. On amiodarone infusion  Encephalopathy. Has been started on Precedex. Will start on Seroquel and try to wean precedex  Renal insufficiency.  BUN, creatinine also slight decrease, will folow closely  Hyperglycemia. On coverage, will adjust    INTAKE / OUTPUT:  Intake/Output Summary (Last 24 hours) at 04/05/2017 0857 Last data filed at 04/05/2017 0600 Gross per 24 hour  Intake 1512.9 ml  Output 3825 ml  Net -2312.1 ml   DOB: 05-17-1950    ADMISSION DATE:  03/30/2017   SUBJECTIVE:   Patient is presently extubated, has been encephalopathic overnight but has been able to be reoriented. Presently sleeping on Precedex  VITAL SIGNS: Temp:  [98.3 F (36.8 C)-101 F (38.3 C)] 98.3 F (36.8 C) (03/10 0800) Pulse Rate:  [73-126] 82 (03/10 0700) Resp:  [18-34] 27 (03/10 0700) BP: (126-173)/(36-99) 173/92 (03/10 0700) SpO2:  [96 %-99 %] 96 % (03/10 0700)   PHYSICAL EXAMINATION: Physical Examination:   VS: BP (!) 173/92   Pulse 82   Temp 98.3 F (36.8 C)   Resp (!) 27   Ht 5' 8"  (1.727 m)   Wt 129.8 kg (286 lb 2.5 oz)   SpO2 96%   BMI 43.51 kg/m   General Appearance: No distress, sedated on Precedex Neuro:as been encephalopathic overnight, presently on Precedex and sedated HEENT: trachea midline, no stridor appreciated Pulmonary: normal breath sounds   Cardiovascular pacer spikes noted, regular rhythm.   Abdomen: Benign exam Skin:   warm, no rashes, no ecchymosis  Extremities: normal, no cyanosis, clubbing.  LABORATORY PANEL:   CBC Recent Labs  Lab 04/04/17 0536  WBC 7.2  HGB 14.1  HCT  42.2  PLT 159    Chemistries  Recent Labs  Lab 04/04/17 0536 04/05/17 0520  NA 141 138  K 4.4 4.6  CL 102 102  CO2 29 28  GLUCOSE 344* 271*  BUN 45* 42*  CREATININE 1.46* 1.32*  CALCIUM 8.6* 8.6*  MG 2.2  --   PHOS 2.7  --   AST 77*  --   ALT 50  --   ALKPHOS 58  --   BILITOT 1.2  --     Recent Labs  Lab 04/04/17 1540 04/04/17 2038 04/04/17 2212 04/05/17 0012 04/05/17 0403 04/05/17 0748  GLUCAP 240* 190* 207* 244* 262* 249*   Recent Labs  Lab 03/30/17 1550  PHART 7.35  PCO2ART 46  PO2ART 96   Recent Labs  Lab 03/30/17 0538 04/04/17 0536  AST 45* 77*  ALT 19 50  ALKPHOS 99 58  BILITOT 0.9 1.2  ALBUMIN 3.2* 2.9*    Cardiac Enzymes Recent Labs  Lab 04/03/17 1500  TROPONINI <0.03    RADIOLOGY:  Ct Head Wo Contrast  Result Date: 04/04/2017 CLINICAL DATA:  Altered mental status, worsening over the last 2 days. EXAM: CT HEAD WITHOUT CONTRAST TECHNIQUE: Contiguous axial images were obtained from the base of the skull through the vertex without intravenous contrast. COMPARISON:  Head CT dated 09/15/2015.  Head CT dated 07/11/2015. FINDINGS: Brain: Generalized parenchymal atrophy with commensurate dilatation of the ventricles and sulci. No hydrocephalus. 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 No  mass, hemorrhage, edema or other evidence of acute parenchymal abnormality. Vascular: There are chronic calcified atherosclerotic changes of the large vessels at the skull base. No unexpected hyperdense vessel. Skull: Normal. Negative for fracture or focal lesion. Sinuses/Orbits: No acute finding. Other: None. IMPRESSION: 1. No acute findings.  No intracranial mass, hemorrhage or edema. 2. 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 Electronically Signed   By: Franki Cabot M.D.   On: 04/04/2017 15:05   Hermelinda Dellen, DO  04/05/2017

## 2017-04-05 NOTE — Progress Notes (Signed)
Pt asking for wife or son's phone numbers. Numbers written on sticky note for patient and phone given to patient. Patient asked for this RN to dial his wife's number for him and hand him the phone. Patient talked with his wife and is now resting at this time. Nursing staff will continue to monitor for any changes in patient status. Earleen Reaper, RN

## 2017-04-05 NOTE — Evaluation (Signed)
Physical Therapy Evaluation Patient Details Name: Christian Berg MRN: [id number] DOB: 04/13/1950 Today's Date: 04/05/2017   History of Present Illness  67 y/o male here after swelling in throat, needed intubation, has been lathargic, delirious, agitated t/o hospitalization. medical history of A-fib ([place]), CHF (congestive heart failure) ([place]), Degenerative disc disease, lumbar, Diabetes mellitus without complication ([place]), Disseminated superficial actinic porokeratosis, Dysrhythmia, Hypercholesteremia, Hypertension, Kidney stones, Peripheral vascular disease ([place]), Presence of permanent cardiac pacemaker, and TIA (transient ischemic attack) (2011).  Clinical Impression  Pt was limited with ability to participate with PT exam.  He was able to hold simple conversation and did show effort with mobility and seemed eager to try to get to recliner but was not at all safe to attempt doing so even with +2 assist. Pt struggled to keep himself upright at EOB and needed heavy assist with getting to/from sitting/supine.  Pt did have a bowel movement and ultimately was unable to tolerate a lot but understands that he is in no way safe to go home and will need rehab when he is eventually medically stable for d/c.     Follow Up Recommendations SNF    Equipment Recommendations  Rolling walker with 5" wheels    Recommendations for Other Services       Precautions / Restrictions Restrictions Weight Bearing Restrictions: No      Mobility  Bed Mobility Overal bed mobility: Needs Assistance Bed Mobility: Supine to Sit;Sit to Supine     Supine to sit: Max assist;+2 for physical assistance Sit to supine: Max assist;+2 for physical assistance   General bed mobility comments: Pt showed effort in getting to EOB, but overall needed a lot of assist and struggled to maintain sitting balance at EOB.  needing assist to keep from falling both back and forward.  Transfers                 General transfer  comment: uanble and unsafe to attempt standing today secondary to weakness, lethargy and agitation  Ambulation/Gait                Stairs            Wheelchair Mobility    Modified Rankin (Stroke Patients Only)       Balance Overall balance assessment: Needs assistance Sitting-balance support: Bilateral upper extremity supported Sitting balance-Leahy Scale: Poor Sitting balance - Comments: Pt needed direct mod+ assist t/o sitting to stay upright.  Leaning back initially, once adjusted forward he started to slump forward.                                     Pertinent Vitals/Pain Pain Assessment: (not rated, c/o some L shoulder pain, vague general soreness)    Home Living Family/patient expects to be discharged to:: Skilled nursing facility Living Arrangements: Spouse/significant other Available Help at Discharge: Family Type of Home: House Home Access: Stairs to enter   [employer] of Steps: 2 Home Layout: One level Home Equipment: Cane - single point;Bedside commode      Prior Function Level of Independence: Needs assistance   Gait / Transfers Assistance Needed: Pt apparently can do some limited in-home ambulation (at times w/o AD) but rarely out of the home except for MD appointments           Hand Dominance        Extremity/Trunk Assessment   Upper Extremity Assessment Upper Extremity  Assessment: Generalized weakness(L shoulder with minimal movement, grossly <3/5 t/o)    Lower Extremity Assessment Lower Extremity Assessment: Generalized weakness(Pt with some AROM in LEs, but globally very weak)       Communication   Communication: (some delirium but able to hold simple conversation)  Cognition Arousal/Alertness: Lethargic Behavior During Therapy: Restless Overall Cognitive Status: Impaired/Different from baseline Area of Impairment: Orientation;Attention;Following commands;Awareness;Safety/judgement                                General Comments: Pt able to participate with minimal interaction and mobility tasks but is very weak, somewhat agitated and struggled to fully participate      General Comments      Exercises     Assessment/Plan    PT Assessment Patient needs continued PT services  PT Problem List Decreased strength;Decreased range of motion;Decreased activity tolerance;Decreased balance;Decreased mobility;Decreased coordination;Decreased cognition;Decreased knowledge of use of DME;Decreased safety awareness;Cardiopulmonary status limiting activity;Obesity;Pain       PT Treatment Interventions DME instruction;Gait training;Functional mobility training;Stair training;Therapeutic activities;Therapeutic exercise;Balance training;Neuromuscular re-education;Cognitive remediation;Patient/family education    PT Goals (Current goals can be found in the Care Plan section)  Acute Rehab PT Goals Patient Stated Goal: none stated PT Goal Formulation: Patient unable to participate in goal setting Time For Goal Achievement: 04/19/17 Potential to Achieve Goals: Fair    Frequency Min 2X/week   Barriers to discharge        Co-evaluation               AM-PAC PT "6 Clicks" Daily Activity  Outcome Measure Difficulty turning over in bed (including adjusting bedclothes, sheets and blankets)?: Unable Difficulty moving from lying on back to sitting on the side of the bed? : Unable Difficulty sitting down on and standing up from a chair with arms (e.g., wheelchair, bedside commode, etc,.)?: Unable Help needed moving to and from a bed to chair (including a wheelchair)?: Total Help needed walking in hospital room?: Total Help needed climbing 3-5 steps with a railing? : Total 6 Click Score: 6    End of Session   Activity Tolerance: Patient limited by lethargy;Patient limited by fatigue Patient left: in bed;with call bell/phone within reach;with nursing/sitter in room Nurse  Communication: Mobility status PT Visit Diagnosis: Muscle weakness (generalized) (M62.81);Difficulty in walking, not elsewhere classified (R26.2)    Time: 3748-2707 PT Time Calculation (min) (ACUTE ONLY): 25 min   Charges:   PT Evaluation $PT Eval Low Complexity: 1 Low     PT G Codes:        Kreg Shropshire, DPT 04/05/2017, 2:37 PM

## 2017-04-05 NOTE — Progress Notes (Addendum)
[facility] for Warfarin Indication: atrial fibrillation  Allergies  Allergen Reactions  . Clindamycin/Lincomycin     Chest discomfort  . Sulfa Antibiotics Hives  . Sulfasalazine Hives    Patient Measurements: Height: 5' 8"  (172.7 cm) Weight: 286 lb 2.5 oz (129.8 kg) IBW/kg (Calculated) : 68.4   Vital Signs: Temp: 98.3 F (36.8 C) (03/10 0800) Temp Source: Axillary (03/10 0300) BP: 173/92 (03/10 0700) Pulse Rate: 82 (03/10 0700)  Labs: Recent Labs    04/03/17 0110 04/03/17 0411 04/03/17 0647 04/03/17 1500 04/04/17 0536 04/05/17 0520  HGB  --   --   --   --  14.1  --   HCT  --   --   --   --  42.2  --   PLT  --   --   --   --  159  --   LABPROT  --  19.6*  --   --  18.4* 17.7*  INR  --  1.67  --   --  1.54 1.47  CREATININE  --  1.47*  --   --  1.46* 1.32*  TROPONINI <0.03  --  <0.03 <0.03  --   --     Estimated Creatinine Clearance: 70.5 mL/min (A) (by C-G formula based on SCr of 1.32 mg/dL (H)).   Medical History: Past Medical History:  Diagnosis Date  . A-fib ([place])   . CHF (congestive heart failure) ([place])   . Degenerative disc disease, lumbar    s/p injury  . Diabetes mellitus without complication ([place])   . Disseminated superficial actinic porokeratosis   . Dysrhythmia    A-FIB, palpatations  . Hypercholesteremia   . Hypertension   . Kidney stones   . Peripheral vascular disease ([place])    Carotid stenosis  . Presence of permanent cardiac pacemaker    Inactive  . TIA (transient ischemic attack) 2011   No deficits    Assessment: 67 y/o M with a h/o atrial fibrillation on warfarin 7.5 mg daily PTA admitted with epiglottitis and intubated. Vitamin K and KCentra ordered with elevated INR for possible trach but not administered.   Patient extubated 3/7 and INR < 2.   3/7   INR 1.87 3/8   INR 1.67  Unable to take PO 3/9   INR 1.54  Unable to take PO  3/10 INR 1.47  Goal of Therapy:  INR 2-3   Plan:   Continue Lovenox for DVT prophylaxis. Per RN, patient should be able to take oral medications-(crushed would be easier).  Will restart Warfarin at 3 mg po x 1 today. (noted that INR on admit was 4.55). Patient now on Amiodarone drip.  Pt on Zosyn. F/u INR in am  Ninfa Giannelli A 04/05/2017,9:08 AM

## 2017-04-05 NOTE — Progress Notes (Signed)
Pulmonary/critical care  Discussed with Dr. Estanislado Pandy from the hospitalist service.  The hospitalist will be assuming his care.  We will be available to assist in consultation if needed  Hermelinda Dellen, DO

## 2017-04-05 NOTE — Progress Notes (Signed)
Pt  C/o headache, and toothache on left side. Pt states he has had 2 teeth extracted in the last two weeks. Pt has also experienced several episodes of incontinent bowel movements since his admission to this unit, however the pt received a Dulcolax suppository this morning at midnight, MD Dustin Flock notified, as he has assumed care of patient? Immodium and tylenol orders placed for patient.

## 2017-04-06 LAB — CBC
HCT: 43.6 % (ref 40.0–52.0)
Hemoglobin: 15.1 g/dL (ref 13.0–18.0)
MCH: 34.1 pg — ABNORMAL HIGH (ref 26.0–34.0)
MCHC: 34.8 g/dL (ref 32.0–36.0)
MCV: 98 fL (ref 80.0–100.0)
Platelets: 150 10*3/uL (ref 150–440)
RBC: 4.44 MIL/uL (ref 4.40–5.90)
RDW: 14.1 % (ref 11.5–14.5)
WBC: 9.8 10*3/uL (ref 3.8–10.6)

## 2017-04-06 LAB — PROTIME-INR
INR: 1.33
Prothrombin Time: 16.4 seconds — ABNORMAL HIGH (ref 11.4–15.2)

## 2017-04-06 LAB — BASIC METABOLIC PANEL
Anion gap: 13 (ref 5–15)
BUN: 44 mg/dL — ABNORMAL HIGH (ref 6–20)
CO2: 22 mmol/L (ref 22–32)
Calcium: 8.8 mg/dL — ABNORMAL LOW (ref 8.9–10.3)
Chloride: 100 mmol/L — ABNORMAL LOW (ref 101–111)
Creatinine, Ser: 1.34 mg/dL — ABNORMAL HIGH (ref 0.61–1.24)
GFR calc Af Amer: >60 mL/min (ref >60)
GFR calc non Af Amer: 53 mL/min — ABNORMAL LOW (ref >60)
Glucose, Bld: 243 mg/dL — ABNORMAL HIGH (ref 65–99)
Potassium: 3.5 mmol/L (ref 3.5–5.1)
Sodium: 135 mmol/L (ref 135–145)

## 2017-04-06 LAB — GLUCOSE, CAPILLARY
Glucose-Capillary: 202 mg/dL — ABNORMAL HIGH (ref 65–99)
Glucose-Capillary: 273 mg/dL — ABNORMAL HIGH (ref 65–99)
Glucose-Capillary: 248 mg/dL — ABNORMAL HIGH (ref 65–99)
Glucose-Capillary: 240 mg/dL — ABNORMAL HIGH (ref 65–99)

## 2017-04-06 MED ORDER — WARFARIN SODIUM 4 MG PO TABS
4.0000 mg | ORAL_TABLET | Freq: Every day | ORAL | Status: DC
  Administered 2017-04-06: 4 mg via ORAL
  Filled 2017-04-06: qty 1

## 2017-04-06 MED ORDER — HALOPERIDOL LACTATE 5 MG/ML IJ SOLN
2.0000 mg | Freq: Once | INTRAMUSCULAR | Status: AC
  Administered 2017-04-06: 2 mg via INTRAVENOUS

## 2017-04-06 MED ORDER — HALOPERIDOL LACTATE 5 MG/ML IJ SOLN
INTRAMUSCULAR | Status: AC
  Administered 2017-04-06: 2 mg via INTRAVENOUS
  Filled 2017-04-06: qty 1

## 2017-04-06 MED ORDER — TRAMADOL HCL 50 MG PO TABS
50.0000 mg | ORAL_TABLET | Freq: Four times a day (QID) | ORAL | Status: DC | PRN
  Administered 2017-04-07: 50 mg via ORAL
  Filled 2017-04-06: qty 1

## 2017-04-06 MED ORDER — AMOXICILLIN-POT CLAVULANATE 875-125 MG PO TABS
1.0000 | ORAL_TABLET | Freq: Two times a day (BID) | ORAL | Status: AC
  Administered 2017-04-06 – 2017-04-08 (×5): 1 via ORAL
  Filled 2017-04-06 (×5): qty 1

## 2017-04-06 MED ORDER — DIPHENHYDRAMINE HCL 50 MG/ML IJ SOLN
50.0000 mg | Freq: Once | INTRAMUSCULAR | Status: AC
  Administered 2017-04-06: 50 mg via INTRAVENOUS

## 2017-04-06 MED ORDER — HALOPERIDOL LACTATE 5 MG/ML IJ SOLN
INTRAMUSCULAR | Status: AC
  Administered 2017-04-06: 5 mg via INTRAVENOUS
  Filled 2017-04-06: qty 1

## 2017-04-06 MED ORDER — DIPHENHYDRAMINE HCL 50 MG/ML IJ SOLN
INTRAMUSCULAR | Status: AC
  Administered 2017-04-06: 50 mg via INTRAVENOUS
  Filled 2017-04-06: qty 1

## 2017-04-06 MED ORDER — CLONIDINE HCL 0.1 MG PO TABS
0.2000 mg | ORAL_TABLET | ORAL | Status: AC
  Administered 2017-04-06: 0.2 mg via ORAL
  Filled 2017-04-06: qty 2

## 2017-04-06 MED ORDER — INSULIN DETEMIR 100 UNIT/ML ~~LOC~~ SOLN
22.0000 [IU] | Freq: Two times a day (BID) | SUBCUTANEOUS | Status: DC
  Administered 2017-04-06 – 2017-04-07 (×2): 22 [IU] via SUBCUTANEOUS
  Filled 2017-04-06 (×3): qty 0.22

## 2017-04-06 MED ORDER — FAMOTIDINE 20 MG PO TABS
20.0000 mg | ORAL_TABLET | Freq: Two times a day (BID) | ORAL | Status: DC
  Administered 2017-04-06 – 2017-04-10 (×8): 20 mg via ORAL
  Filled 2017-04-06 (×8): qty 1

## 2017-04-06 MED ORDER — HALOPERIDOL LACTATE 5 MG/ML IJ SOLN
5.0000 mg | Freq: Once | INTRAMUSCULAR | Status: AC
  Administered 2017-04-06: 5 mg via INTRAVENOUS

## 2017-04-06 NOTE — Progress Notes (Signed)
Patient yelling out continuously and disturbing other patients on the unit. Patient educated on importance of using call bell instead of yelling. Given 17m x1 and 533mx1 of IV haldol, 5071m1 of IV benadryl; he received 2mg16m IV morphine at 2327 and 1mg 63mIV ativan at 2050. Notified MD DiamoMarcille Blancocatapres 0.2 mg PO ordered STAT. Nursing staff will continue to monitor for any changes in patient status. Cliffton Spradley Earleen Reaper

## 2017-04-06 NOTE — Progress Notes (Signed)
Patient has gradually become less agitated and delirious as the shift has gone by. Wife has been at bedside most of the afternoon and this has helped him. Chronic pain managed well by scheduled medications. Foley removed this morning, and patient has had incontinent episodes. Patient continues to need assistance with meals due to weakness and poor upper extremity coordination.

## 2017-04-06 NOTE — Progress Notes (Signed)
Christian Berg at [place] NAME: Christian Berg    MR#:  [id number]  DATE OF BIRTH:  01-16-1951  SUBJECTIVE:   Patient originally admitted for epiglottitis and was intubated for airway protection. Patient has since then been extubated and postextubation patient developed agitation/delirium.Patient still seems somewhat lethargic and confused. More awake today since the past few days.  REVIEW OF SYSTEMS:    Review of Systems  Unable to perform ROS: Mental acuity    Nutrition: Heart healthy/Carb modified Tolerating Diet: Yes Tolerating PT: Eval noted.   DRUG ALLERGIES:   Allergies  Allergen Reactions  . Clindamycin/Lincomycin     Chest discomfort  . Sulfa Antibiotics Hives  . Sulfasalazine Hives    VITALS:  Blood pressure 113/62, pulse 84, temperature 98.3 F (36.8 C), resp. rate 18, height 5' 8"  (1.727 m), weight 125.4 kg (276 lb 6.4 oz), SpO2 100 %.  PHYSICAL EXAMINATION:   Physical Exam  GENERAL:  67 y.o.-year-old patient lying in bed confused but in NAD.  EYES: Pupils equal, round, reactive to light. No scleral icterus. Extraocular muscles intact.  HEENT: Head atraumatic, normocephalic. Oropharynx and nasopharynx clear.  NECK:  Supple, no jugular venous distention. No thyroid enlargement, no tenderness.  LUNGS: Normal breath sounds bilaterally, no wheezing, rales, rhonchi. No use of accessory muscles of respiration.  CARDIOVASCULAR: S1, S2 normal. No murmurs, rubs, or gallops.  ABDOMEN: Soft, nontender, nondistended. Bowel sounds present. No organomegaly or mass.  EXTREMITIES: No cyanosis, clubbing or edema b/l.    NEUROLOGIC: Cranial nerves II through XII are intact. No focal Motor or sensory deficits b/l.  Globally weak. PSYCHIATRIC: The patient is alert and oriented x 2.  SKIN: No obvious rash, lesion, or ulcer.    LABORATORY PANEL:   CBC Recent Labs  Lab 04/06/17 0902  WBC 9.8  HGB 15.1  HCT 43.6  PLT 150    ------------------------------------------------------------------------------------------------------------------  Chemistries  Recent Labs  Lab 04/04/17 0536  04/06/17 0902  NA 141   < > 135  K 4.4   < > 3.5  CL 102   < > 100*  CO2 29   < > 22  GLUCOSE 344*   < > 243*  BUN 45*   < > 44*  CREATININE 1.46*   < > 1.34*  CALCIUM 8.6*   < > 8.8*  MG 2.2  --   --   AST 77*  --   --   ALT 50  --   --   ALKPHOS 58  --   --   BILITOT 1.2  --   --    < > = values in this interval not displayed.   ------------------------------------------------------------------------------------------------------------------  Cardiac Enzymes Recent Labs  Lab 04/03/17 1500  TROPONINI <0.03   ------------------------------------------------------------------------------------------------------------------  RADIOLOGY:  Ct Head Wo Contrast  Result Date: 04/04/2017 CLINICAL DATA:  Altered mental status, worsening over the last 2 days. EXAM: CT HEAD WITHOUT CONTRAST TECHNIQUE: Contiguous axial images were obtained from the base of the skull through the vertex without intravenous contrast. COMPARISON:  Head CT dated 09/15/2015.  Head CT dated 07/11/2015. FINDINGS: Brain: Generalized parenchymal atrophy with commensurate dilatation of the ventricles and sulci. No hydrocephalus. 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 No mass, hemorrhage, edema or other evidence of acute parenchymal abnormality. Vascular: There are chronic calcified atherosclerotic changes of the large vessels at the skull base. No unexpected hyperdense vessel. Skull: Normal. Negative for fracture or focal lesion.  Sinuses/Orbits: No acute finding. Other: None. IMPRESSION: 1. No acute findings.  No intracranial mass, hemorrhage or edema. 2. 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 Electronically Signed   By: Franki Cabot M.D.   On: 04/04/2017 15:05     ASSESSMENT AND PLAN:    67 year old male with past medical history of hypertension, hyperlipidemia, diabetes, CHF, chronic atrial fibrillation, previous history of TIA was admitted to the hospital due to severe epiglottitis and needed urgent intubation for airway protection, now has become delirious with altered mental status.  1. Altered mental status-this is metabolic encephalopathy and likely ICU delirium. CT head was negative for acute pathology, seen by neurology and he did not recommend any further intervention other than supportive care. -we'll DC patient's IV Decadron as this could be contributing to delirium. Keep patient awake during the days, Sleep at night. - avoid Benzo's for agitation and use haldol as needed - cont. Seroquel at bedtime.   2. epiglottitis-now resolved.Patient having no acute respiratory issues. Patient has received 8 days of IV Zosyn and will switch to oral Augmentin for 2 more days. DC Decadron for now as this could be contributing to his mental status change.  3.history of chronic atrial fibrillation-rate controlled. Continue amiodarone,metoprolol, Cardizem -Continue Coumadin.  4. Diabetes type 2 without complication-continue Levemir, sliding scale insulin, metformin, Glipizide. Blood sugar stable.  5. Hyperlipidemia-continue Pravachol.  Seen by physical therapy and recommended short-term rehabilitation, social work notified.  All the records are reviewed and case discussed with Care Management/Social Worker. Management plans discussed with the patient, family and they are in agreement.  CODE STATUS: full  DVT Prophylaxis: Coumadin  TOTAL TIME TAKING CARE OF THIS PATIENT: 30 minutes.   POSSIBLE D/C IN 1-2 DAYS, DEPENDING ON CLINICAL CONDITION.   Henreitta Leber M.D on 04/06/2017 at 2:49 PM  Between 7am to 6pm - Pager - [phone]  After 6pm go to [email or web address] - [facility]  Office  [phone]  CC: Primary care  physician; [name], [place]

## 2017-04-06 NOTE — Consult Note (Signed)
Reason for Consult:agitation  Referring Physician: Dr. Jefferson Fuel   CC: agitation   Much more awake and following commands.    Past Medical History:  Diagnosis Date  . A-fib ([place])   . CHF (congestive heart failure) ([place])   . Degenerative disc disease, lumbar    s/p injury  . Diabetes mellitus without complication ([place])   . Disseminated superficial actinic porokeratosis   . Dysrhythmia    A-FIB, palpatations  . Hypercholesteremia   . Hypertension   . Kidney stones   . Peripheral vascular disease ([place])    Carotid stenosis  . Presence of permanent cardiac pacemaker    Inactive  . TIA (transient ischemic attack) 2011   No deficits    Past Surgical History:  Procedure Laterality Date  . APPENDECTOMY  2004  . CARDIAC CATHETERIZATION    . CARDIAC PACEMAKER PLACEMENT  2005  . CATARACT EXTRACTION W/PHACO Right 06/14/2014   Procedure: CATARACT EXTRACTION PHACO AND INTRAOCULAR LENS PLACEMENT (IOC);  Surgeon: Leandrew Koyanagi, MD;  Location: [facility];  Service: Ophthalmology;  Laterality: Right;  . CYSTOSCOPY W/ RETROGRADES Bilateral 08/09/2014   Procedure: CYSTOSCOPY WITH RETROGRADE PYELOGRAM;  Surgeon: Hollice Espy, MD;  Location: [facility];  Service: Urology;  Laterality: Bilateral;  . INTUBATION-ENDOTRACHEAL WITH TRACHEOSTOMY STANDBY  03/30/2017   Procedure: INTUBATION-ENDOTRACHEAL WITH TRACHEOSTOMY STANDBY;  Surgeon: Carloyn Manner, MD;  Location: [facility];  Service: ENT;;    Family History  Adopted: Yes  Problem Relation Age of Onset  . Heart disease Mother   . Fibromyalgia Sister   . Hypertension Daughter   . Migraines Daughter   . Kidney disease Neg Hx   . Prostate cancer Neg Hx   . Bladder Cancer Neg Hx     Social History:  reports that  has never smoked. he has never used smokeless tobacco. He reports that he does not drink alcohol or use drugs.  Allergies  Allergen Reactions  . Clindamycin/Lincomycin     Chest discomfort  . Sulfa Antibiotics Hives   . Sulfasalazine Hives    Medications: I have reviewed the patient's current medications.   Physical Examination: Blood pressure (!) 156/73, pulse 73, temperature 98.3 F (36.8 C), resp. rate 18, height 5' 8"  (1.727 m), weight 276 lb 6.4 oz (125.4 kg), SpO2 100 %.    Neurological Examination   Mental Status: Alert to name only.  Cranial Nerves: II: Discs flat bilaterally; Visual fields grossly normal, pupils equal, round, reactive to light and accommodation III,IV, VI: ptosis not present, extra-ocular motions intact bilaterally V,VII: smile symmetric, facial light touch sensation normal bilaterally VIII: hearing normal bilaterally IX,X: gag reflex present XI: bilateral shoulder shrug XII: midline tongue extension Motor: Right : Upper extremity   5/5    Left:     Upper extremity   5/5  Lower extremity   5/5     Lower extremity   5/5 Tone and bulk:normal tone throughout; no atrophy noted Sensory: Pinprick and light touch intact throughout, bilaterally Deep Tendon Reflexes: 1+ and symmetric throughout Plantars: Right: downgoing   Left: downgoing Cerebellar: Not tested Gait: not tested      Laboratory Studies:   Basic Metabolic Panel: Recent Labs  Lab 04/02/17 0415 04/03/17 0411 04/04/17 0536 04/05/17 0520 04/06/17 0902  NA 145 142 141 138 135  K 4.3 3.8 4.4 4.6 3.5  CL 108 101 102 102 100*  CO2 29 30 29 28 22   GLUCOSE 216* 197* 344* 271* 243*  BUN 34* 34* 45* 42* 44*  CREATININE 1.33* 1.47* 1.46* 1.32* 1.34*  CALCIUM 8.5* 8.5* 8.6* 8.6* 8.8*  MG  --   --  2.2  --   --   PHOS  --   --  2.7  --   --     Liver Function Tests: Recent Labs  Lab 04/04/17 0536  AST 77*  ALT 50  ALKPHOS 58  BILITOT 1.2  PROT 7.0  ALBUMIN 2.9*   No results for input(s): LIPASE, AMYLASE in the last 168 hours. No results for input(s): AMMONIA in the last 168 hours.  CBC: Recent Labs  Lab 04/04/17 0536 04/06/17 0902  WBC 7.2 9.8  HGB 14.1 15.1  HCT 42.2 43.6  MCV  100.8* 98.0  PLT 159 150    Cardiac Enzymes: Recent Labs  Lab 04/03/17 0110 04/03/17 0647 04/03/17 1500  TROPONINI <0.03 <0.03 <0.03    BNP: Invalid input(s): POCBNP  CBG: Recent Labs  Lab 04/05/17 0748 04/05/17 1121 04/05/17 1658 04/05/17 2033 04/06/17 0753  GLUCAP 249* 311* 257* 228* 202*    Microbiology: Results for orders placed or performed during the hospital encounter of 03/30/17  Group A Strep by PCR ([place] Only)     Status: None   Collection Time: 03/30/17  5:38 AM  Result Value Ref Range Status   Group A Strep by PCR NOT DETECTED NOT DETECTED Final    Comment: Performed at [facility], [address] [place], [place] [address]  MRSA PCR Screening     Status: None   Collection Time: 03/30/17  9:23 AM  Result Value Ref Range Status   MRSA by PCR NEGATIVE NEGATIVE Final    Comment:        The GeneXpert MRSA Assay (FDA approved for NASAL specimens only), is one component of a comprehensive MRSA colonization surveillance program. It is not intended to diagnose MRSA infection nor to guide or monitor treatment for MRSA infections. Performed at [facility], [address] [place],  [address]     Coagulation Studies: Recent Labs    04/04/17 0536 04/05/17 0520 04/06/17 0902  LABPROT 18.4* 17.7* 16.4*  INR 1.54 1.47 1.33    Urinalysis: No results for input(s): COLORURINE, LABSPEC, PHURINE, GLUCOSEU, HGBUR, BILIRUBINUR, KETONESUR, PROTEINUR, UROBILINOGEN, NITRITE, LEUKOCYTESUR in the last 168 hours.  Invalid input(s): APPERANCEUR  Lipid Panel:     Component Value Date/Time   CHOL 105 05/24/2015 1058   TRIG 136 05/24/2015 1058   HDL 36 (L) 05/24/2015 1058   CHOLHDL 2.9 05/24/2015 1058   LDLCALC 42 05/24/2015 1058    HgbA1C:  Lab Results  Component Value Date   HGBA1C 8.6 (H) 04/01/2017    Urine Drug Screen:  No results found for: LABOPIA, COCAINSCRNUR, LABBENZ, AMPHETMU, THCU, LABBARB  Alcohol Level: No  results for input(s): ETH in the last 168 hours.  Other results: EKG: normal EKG, normal sinus rhythm, unchanged from previous tracings.  Imaging: Ct Head Wo Contrast  Result Date: 04/04/2017 CLINICAL DATA:  Altered mental status, worsening over the last 2 days. EXAM: CT HEAD WITHOUT CONTRAST TECHNIQUE: Contiguous axial images were obtained from the base of the skull through the vertex without intravenous contrast. COMPARISON:  Head CT dated 09/15/2015.  Head CT dated 07/11/2015. FINDINGS: Brain: Generalized parenchymal atrophy with commensurate dilatation of the ventricles and sulci. No hydrocephalus. 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 No mass, hemorrhage, edema or other evidence of acute parenchymal abnormality. Vascular: There are chronic calcified atherosclerotic changes of the  large vessels at the skull base. No unexpected hyperdense vessel. Skull: Normal. Negative for fracture or focal lesion. Sinuses/Orbits: No acute finding. Other: None. IMPRESSION: 1. No acute findings.  No intracranial mass, hemorrhage or edema. 2. 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 Electronically Signed   By: Franki Cabot M.D.   On: 04/04/2017 15:05     Assessment/Plan:   67 y.o. male with epiglotitis with respiratory distress s/p extubation.  Pt has been confused and disoriented.   Much more awake. Still periods of confusion but much improved Pt was started on seroquel nightly which appears to have helped  Please call with questions   04/06/2017, 10:59 AM

## 2017-04-06 NOTE — Progress Notes (Signed)
PT Cancellation Note  Patient Details Name: Christian Berg MRN: [id number] DOB: Dec 02, 1950   Cancelled Treatment:    Reason Eval/Treat Not Completed: Other (comment). Treatment attempted, pt just placed on bed pan and requests return at a later time. Re attempt at a later date.    Larae Grooms, PTA 04/06/2017, 2:41 PM

## 2017-04-06 NOTE — Consult Note (Signed)
[facility] Nurse wound consult note Reason for Consult: Consult requested for wounds.   Right leg has several areas of dry circular scabbed red areas; pt states this is a chronic skin condition which he has been treated for in the past.  There are no open wounds or drainage.  Wound type: Scrotum, posterior buttocks, and inner groin are red, macerated, and painful to the touch.  Appearance is consistent with moisture associated skin damage.   Pt is frequently incontinent of urine and stool and it is difficult to keep the affected area from becoming soiled.   Deep tissue pressure injury/shear injury .5X5cm to upper buttocks area Pressure Injury POA: Yes  Dressing procedure/placement/frequency:  Barrier cream and antifungal powder to promote healing to areas of moisture associated skin damage.  Discussed plan of care with patient. Please re-consult if further assistance is needed.  Thank-you,  Julien Girt MSN, [place], [place], [place], [place]

## 2017-04-06 NOTE — Progress Notes (Addendum)
Inpatient Diabetes Program Recommendations  AACE/ADA: New Consensus Statement on Inpatient Glycemic Control (2015)  Target Ranges:  Prepandial:   less than 140 mg/dL      Peak postprandial:   less than 180 mg/dL (1-2 hours)      Critically ill patients:  140 - 180 mg/dL  Results for Ells, HAMDI KLEY "RAY" (MRN [id number]) as of 04/06/2017 10:08  Ref. Range 04/05/2017 07:48 04/05/2017 11:21 04/05/2017 16:58 04/05/2017 20:33 04/06/2017 07:53  Glucose-Capillary Latest Ref Range: 65 - 99 mg/dL 249 (H) 311 (H) 257 (H) 228 (H) 202 (H)    Review of Glycemic Control  Diabetes history: DM2 Outpatient Diabetes medications: Glipizide 10 mg BID, Metformin 850 mg BID Current orders for Inpatient glycemic control: Lantus 20 units Q12H, Novolog 0-20 units TID with meals, Novolog 0-5 units QHS, Glipizide 10 mg BID, Metformin 850 mg BID; Decadron 4 mg Q12H  Inpatient Diabetes Program Recommendations: Insulin - Basal: If Decadron is continued as ordered, please consider increasing Lantus to 25 units Q12H. Insulin - Meal Coverage: If Decadron is continued as ordered and post prandial glucose remains consistently greater than 180 mg/dl, please consider ordering Novolog 5 units TID with meals for meal coverage.  Thanks, Barnie Alderman, RN, MSN, CDE Diabetes Coordinator Inpatient Diabetes Program [phone] (Team Pager from 8am to 5pm)

## 2017-04-06 NOTE — Progress Notes (Signed)
Speech Language Pathology Treatment: Dysphagia  Patient Details Name: Christian Berg MRN: [id number] DOB: [birth year] Today's Date: 04/06/2017 Time: 4097-3532 SLP Time Calculation (min) (ACUTE ONLY): 37 min  Assessment / Plan / Recommendation Clinical Impression  Pt seen for ongoing assessment and toleration of an oral diet post epiglottitis and oral intubation/extubation. Pt remains somewhat confused and requires cues but appears more alert, less agitated this PM. Wife present assisting pt w/ feeding him during the dinner meal. Diet consistency was upgraded to a regular diet consistency by the MD over the weekend.  Pt was attempting to chew a bite of the hamburger he ordered (wife stated was tough) then verbally responded to SLP upon entering the room. Suspect possible pharyngeal/laryngeal irritation followed by coughing. Given time, pt was able to calm the cough and resume eating and drinking w/ Wife. Pt consumed mostly liquids and an ice cream-like dessert - strongly discouraged the particulate, dry potato chips. No oropharyngeal phase deficits noted w/ the liquids; no decline in respiratory status or vocal quality was noted post trials.  Time spent after on education re: aspiration precautions; food options/choices and preparation - use of condiments. Wife stated pt was "picky" to Dietitian follow-up was requested for tomorrow to help establish some other meal items vs a hamburger and chips which may be too particulate and tough for pt to each much of safely at this time. Wife agreed. Will offer the option of cutting any tougher meats if needed. Suspect pt's oropharyngeal swallow function should be intact but pt could have some residual irritation from epiglottitis and intubation; extended illness. Will f/u next 1-2 days for toleration of oral diet; NSG to consult if any decline in status is noted.     HPI HPI: Pt is a 67 y.o. male with a known history of atrial fibrillation, congestive heart  failure, diabetes type 2, atrial fibrillation, hyperlipidemia, liver cirrhosis - other, obesity, essential hypertension, peripheral vascular disease as well as presence of inactive permanent cardiac pacemaker it has no battery and there who was admitted from the ER by the ENT physicians after he presented with epiglottitis and required emergent intubation by ENT.  Patient was in the ICU subsequently extubated.  He has been agitated and has thought to have acute delirium.  Patient continues to be confused and agitated.  Currently asking for his tramadol for his back pain.  His son and caregiver are at the bedside.  They report that he normally is not like this.  Family reported pt does have some forgetfulness, but is not agitated.  Pt was initially evaluated by BSE in CCU and has now transferred to a floor unit.  MD upgraded the pt's diet consistency to Regular w/ thin liquids over the weekend.  NSG reported he had been tolerating his oral medications w/ water; no deficits reported by the Wife who feeds him during meals.       SLP Plan  Continue with current plan of care       Recommendations  Diet recommendations: Regular;Thin liquid(as per MD order) Liquids provided via: Cup;Straw(monitor any straw use) Medication Administration: Whole meds with puree(if ANY difficulty swallowing w/ liquids) Supervision: Staff to assist with self feeding;Full supervision/cueing for compensatory strategies(pt is having Wife feed him the majority of time) Compensations: Minimize environmental distractions;Slow rate;Small sips/bites;Lingual sweep for clearance of pocketing;Multiple dry swallows after each bite/sip;Follow solids with liquid Postural Changes and/or Swallow Maneuvers: Upright 30-60 min after meal;Seated upright 90 degrees  General recommendations: (Dietician follow up for food choices and options) Oral Care Recommendations: Oral care BID;Staff/trained caregiver to provide oral  care;Patient independent with oral care Follow up Recommendations: (TBD) SLP Visit Diagnosis: Dysphagia, oropharyngeal phase (R13.12) Plan: Continue with current plan of care       [place], [place], CCC-SLP [name],Katherine 04/06/2017, 5:53 PM

## 2017-04-06 NOTE — Progress Notes (Signed)
[place] for Warfarin Indication: atrial fibrillation  Allergies  Allergen Reactions  . Clindamycin/Lincomycin     Chest discomfort  . Sulfa Antibiotics Hives  . Sulfasalazine Hives    Patient Measurements: Height: 5' 8"  (172.7 cm) Weight: 276 lb 6.4 oz (125.4 kg) IBW/kg (Calculated) : 68.4   Vital Signs: Temp: 98.3 F (36.8 C) (03/11 0750) Temp Source: Oral (03/11 0314) BP: 156/73 (03/11 0750) Pulse Rate: 73 (03/11 0750)  Labs: Recent Labs    04/03/17 1500 04/04/17 0536 04/05/17 0520  HGB  --  14.1  --   HCT  --  42.2  --   PLT  --  159  --   LABPROT  --  18.4* 17.7*  INR  --  1.54 1.47  CREATININE  --  1.46* 1.32*  TROPONINI <0.03  --   --     Estimated Creatinine Clearance: 69.1 mL/min (A) (by C-G formula based on SCr of 1.32 mg/dL (H)).   Medical History: Past Medical History:  Diagnosis Date  . A-fib ([place])   . CHF (congestive heart failure) ([place])   . Degenerative disc disease, lumbar    s/p injury  . Diabetes mellitus without complication ([place])   . Disseminated superficial actinic porokeratosis   . Dysrhythmia    A-FIB, palpatations  . Hypercholesteremia   . Hypertension   . Kidney stones   . Peripheral vascular disease ([place])    Carotid stenosis  . Presence of permanent cardiac pacemaker    Inactive  . TIA (transient ischemic attack) 2011   No deficits    Assessment: 67 y/o M with a h/o atrial fibrillation on warfarin 7.5 mg daily PTA admitted with epiglottitis and intubated. Vitamin K and KCentra ordered with elevated INR for possible trach but not administered.   Patient extubated 3/7 and INR < 2.   3/7   INR 1.87 3/8   INR 1.67  Unable to take PO 3/9   INR 1.54  Unable to take PO  3/10 INR 1.47  Warfarin 2m  Goal of Therapy:  INR 2-3   Plan:  Continue Lovenox for DVT prophylaxis. Per RN, patient should be able to take oral medications-(crushed would be easier). Warfarin was restarted  yesterday Pt has been started on amiodarone drip and now tabs this admission. Due to supratherapeutic INR on admission plus the addition of amiodarone which interactions with warfarin to raise INR, I will prophylacticly decrease pt home dose by 40-50% to 44mdaily. F/u INR in am  MeRamond DialPharm.D, BCPS Clinical Pharmacist  04/06/2017,8:00 AM

## 2017-04-07 LAB — GLUCOSE, CAPILLARY
Glucose-Capillary: 187 mg/dL — ABNORMAL HIGH (ref 65–99)
Glucose-Capillary: 246 mg/dL — ABNORMAL HIGH (ref 65–99)
Glucose-Capillary: 168 mg/dL — ABNORMAL HIGH (ref 65–99)
Glucose-Capillary: 135 mg/dL — ABNORMAL HIGH (ref 65–99)

## 2017-04-07 LAB — PROTIME-INR
INR: 1.06
Prothrombin Time: 13.7 seconds (ref 11.4–15.2)

## 2017-04-07 MED ORDER — WARFARIN SODIUM 5 MG PO TABS
5.0000 mg | ORAL_TABLET | Freq: Once | ORAL | Status: AC
  Administered 2017-04-07: 5 mg via ORAL
  Filled 2017-04-07: qty 1

## 2017-04-07 MED ORDER — QUETIAPINE FUMARATE 25 MG PO TABS
25.0000 mg | ORAL_TABLET | Freq: Every day | ORAL | Status: DC
  Administered 2017-04-07 – 2017-04-10 (×4): 25 mg via ORAL
  Filled 2017-04-07 (×4): qty 1

## 2017-04-07 MED ORDER — QUETIAPINE FUMARATE 25 MG PO TABS
50.0000 mg | ORAL_TABLET | Freq: Every day | ORAL | Status: DC
  Administered 2017-04-07 – 2017-04-09 (×3): 50 mg via ORAL
  Filled 2017-04-07 (×3): qty 2

## 2017-04-07 MED ORDER — INSULIN DETEMIR 100 UNIT/ML ~~LOC~~ SOLN
22.0000 [IU] | Freq: Every day | SUBCUTANEOUS | Status: DC
Start: 2017-04-08 — End: 2017-04-09
  Administered 2017-04-08: 22 [IU] via SUBCUTANEOUS
  Filled 2017-04-07 (×2): qty 0.22

## 2017-04-07 MED ORDER — PREMIER PROTEIN SHAKE
11.0000 [oz_av] | Freq: Two times a day (BID) | ORAL | Status: DC
  Administered 2017-04-07 – 2017-04-09 (×5): 11 [oz_av] via ORAL

## 2017-04-07 MED ORDER — ACETAMINOPHEN 325 MG PO TABS: 650.0000 mg | ORAL_TABLET | Freq: Four times a day (QID) | ORAL | Status: DC | PRN

## 2017-04-07 NOTE — NC FL2 (Signed)
[place] LEVEL OF CARE SCREENING TOOL     IDENTIFICATION  Patient Name: Christian Berg Birthdate: [birth year] Sex: male Admission Date (Current Location): 03/30/2017  [place] and [place] Number:  Engineering geologist and Address:  [facility], [address] [place], [place] [address]      Provider Number: [id number]  Attending Physician Name and Address:  Henreitta Leber, MD  Relative Name and Phone Number:  Kahil, Agner [phone] or Tian, [place]rey  [phone]       Current Level of Care: Hospital Recommended Level of Care: [place] Prior Approval Number:    Date Approved/Denied:   PASRR Number: [id number]  Discharge Plan: SNF    Current Diagnoses: Patient Active Problem List   Diagnosis Date Noted  . 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 Pressure injury of skin 03/30/2017  . Hypoalbuminemia 10/22/2015  . NASH (nonalcoholic steatohepatitis) 10/22/2015  . Closed fracture of proximal end of left humerus with delayed healing 10/17/2015  . Ischemic embolic stroke ([place]) 41/32/4401  . Peripheral vascular disease ([place]) 12/15/2014  . Morbid (severe) obesity due to excess calories ([place]) 11/22/2014  . VFD (visual field defect) 11/22/2014  . History of TIA (transient ischemic attack) 10/31/2014  . Kidney stones 07/24/2014  . Other cirrhosis of liver ([place]) 07/24/2014  . DSAP (disseminated superficial actinic porokeratosis) 07/17/2014  . Asthma 05/31/2014  . Chronic diastolic heart failure ([place]) 05/31/2014  . Diabetes mellitus ([place]) 05/31/2014  . Hypercholesteremia 05/31/2014  . B12 deficiency 05/31/2014  . Carotid artery narrowing 02/15/2014  . Bilateral carotid artery stenosis 02/15/2014  . Chronic atrial fibrillation ([place]) 10/24/2013  . Benign prostatic hyperplasia with urinary obstruction 09/06/2012  . Heart disease 05/29/2009  . Benign essential HTN 09/29/2008  . Narrowing of intervertebral disc space  01/11/2007    Orientation RESPIRATION BLADDER Height & Weight     Self, Place    Continent Weight: 273 lb 12.8 oz (124.2 kg) Height:  5' 8"  (172.7 cm)  BEHAVIORAL SYMPTOMS/MOOD NEUROLOGICAL BOWEL NUTRITION STATUS      Incontinent Diet(Carb modified)  AMBULATORY STATUS COMMUNICATION OF NEEDS Skin   Limited Assist Verbally PU Stage and Appropriate Care                       Personal Care Assistance Level of Assistance  Dressing, Feeding, Bathing Bathing Assistance: Limited assistance Feeding assistance: Independent Dressing Assistance: Limited assistance     Functional Limitations Info  Sight, Hearing, Speech Sight Info: Adequate Hearing Info: Adequate Speech Info: Adequate    SPECIAL CARE FACTORS FREQUENCY  PT (By licensed PT), OT (By licensed OT)     PT Frequency: 5x a week OT Frequency: 5x a week            Contractures      Additional Factors Info  Allergies, Psychotropic, Insulin Sliding Scale   Allergies Info: CLINDAMYCIN/LINCOMYCIN, SULFA ANTIBIOTICS, SULFASALAZINE  Psychotropic Info: QUEtiapine (SEROQUEL) tablet 25 mg and QUEtiapine (SEROQUEL) tablet 50 mg  Insulin Sliding Scale Info: insulin aspart (novoLOG) injection 0-20 Units 3x a day with meals.       Current Medications (04/07/2017):  This is the current hospital active medication list Current Facility-Administered Medications  Medication Dose Route Frequency Provider Last Rate Last Dose  . 0.9 %  sodium chloride infusion  250 mL Intravenous PRN Vaught, Jeannie Fend, MD      . acetaminophen (TYLENOL) tablet 650 mg  650 mg Oral Q6H PRN Henreitta Leber, MD      .  amiodarone (PACERONE) tablet 200 mg  200 mg Oral Daily Conforti, John, DO   200 mg at 04/07/17 1003  . amoxicillin-clavulanate (AUGMENTIN) 875-125 MG per tablet 1 tablet  1 tablet Oral Q12H Henreitta Leber, MD   1 tablet at 04/07/17 1002  . aspirin EC tablet 81 mg  81 mg Oral QPM Dustin Flock, MD   81 mg at 04/07/17 1724  .  cyclobenzaprine (FLEXERIL) tablet 5 mg  5 mg Oral QHS Dustin Flock, MD   5 mg at 04/06/17 2221  . diltiazem (CARDIZEM) tablet 60 mg  60 mg Oral Q8H Dustin Flock, MD   60 mg at 04/07/17 1523  . enoxaparin (LOVENOX) injection 40 mg  40 mg Subcutaneous Q12H Flora Lipps, MD   40 mg at 04/07/17 1222  . famotidine (PEPCID) tablet 20 mg  20 mg Oral BID Henreitta Leber, MD   20 mg at 04/07/17 1002  . glipiZIDE (GLUCOTROL) tablet 10 mg  10 mg Oral BID AC Dustin Flock, MD   10 mg at 04/07/17 1724  . insulin aspart (novoLOG) injection 0-20 Units  0-20 Units Subcutaneous TID WC Saundra Shelling, MD   4 Units at 04/07/17 1722  . insulin aspart (novoLOG) injection 0-5 Units  0-5 Units Subcutaneous QHS Saundra Shelling, MD   2 Units at 04/06/17 2222  . [START ON 04/08/2017] insulin detemir (LEVEMIR) injection 22 Units  22 Units Subcutaneous Daily Sainani, Vivek J, MD      . lisinopril (PRINIVIL,ZESTRIL) tablet 20 mg  20 mg Oral Daily Dustin Flock, MD   20 mg at 04/07/17 1002  . loperamide (IMODIUM) capsule 2 mg  2 mg Oral Q6H PRN Dustin Flock, MD      . LORazepam (ATIVAN) injection 1 mg  1 mg Intravenous Q6H PRN Conforti, John, DO   1 mg at 04/06/17 0440  . magic mouthwash  10 mL Oral TID Dustin Flock, MD   10 mL at 04/07/17 1525  . MEDLINE mouth rinse  15 mL Mouth Rinse BID Conforti, John, DO   15 mL at 04/06/17 2222  . metFORMIN (GLUCOPHAGE) tablet 850 mg  850 mg Oral BID WC Dustin Flock, MD   850 mg at 04/07/17 1724  . metoprolol tartrate (LOPRESSOR) tablet 100 mg  100 mg Oral QHS Dustin Flock, MD   100 mg at 04/06/17 2220  . morphine 2 MG/ML injection 2 mg  2 mg Intravenous Q4H PRN Dustin Flock, MD   2 mg at 04/06/17 0441  . pravastatin (PRAVACHOL) tablet 10 mg  10 mg Oral q1800 Dustin Flock, MD   10 mg at 04/07/17 1723  . protein supplement (PREMIER PROTEIN) liquid  11 oz Oral BID BM Henreitta Leber, MD   11 oz at 04/07/17 1524  . QUEtiapine (SEROQUEL) tablet 25 mg  25 mg Oral  Daily Leotis Pain, MD   25 mg at 04/07/17 1735  . QUEtiapine (SEROQUEL) tablet 50 mg  50 mg Oral QHS Leotis Pain, MD      . traMADol Veatrice [place]) tablet 50 mg  50 mg Oral Q6H PRN Henreitta Leber, MD   50 mg at 04/07/17 1224  . traMADol-acetaminophen (ULTRACET) 37.5-325 MG per tablet 2 tablet  2 tablet Oral QID Dustin Flock, MD   2 tablet at 04/07/17 1723  . Warfarin - Pharmacist Dosing Inpatient   Does not apply [id number] Flora Lipps, MD         Discharge Medications: Please see discharge summary for a list of discharge medications.  Relevant Imaging Results:  Relevant Lab Results:   Additional Information SSN [id number]  Ross Ludwig, [place]

## 2017-04-07 NOTE — Progress Notes (Signed)
[facility] for Warfarin Indication: atrial fibrillation  Allergies  Allergen Reactions  . Clindamycin/Lincomycin     Chest discomfort  . Sulfa Antibiotics Hives  . Sulfasalazine Hives    Patient Measurements: Height: 5' 8"  (172.7 cm) Weight: 273 lb 12.8 oz (124.2 kg) IBW/kg (Calculated) : 68.4   Vital Signs: Temp: 97.8 F (36.6 C) (03/12 0346) Temp Source: Oral (03/12 0346) BP: 153/92 (03/12 0346) Pulse Rate: 91 (03/12 0346)  Labs: Recent Labs    04/05/17 0520 04/06/17 0902 04/07/17 0606  HGB  --  15.1  --   HCT  --  43.6  --   PLT  --  150  --   LABPROT 17.7* 16.4* 13.7  INR 1.47 1.33 1.06  CREATININE 1.32* 1.34*  --     Estimated Creatinine Clearance: 67.7 mL/min (A) (by C-G formula based on SCr of 1.34 mg/dL (H)).   Medical History: Past Medical History:  Diagnosis Date  . A-fib ([place])   . CHF (congestive heart failure) ([place])   . Degenerative disc disease, lumbar    s/p injury  . Diabetes mellitus without complication ([place])   . Disseminated superficial actinic porokeratosis   . Dysrhythmia    A-FIB, palpatations  . Hypercholesteremia   . Hypertension   . Kidney stones   . Peripheral vascular disease ([place])    Carotid stenosis  . Presence of permanent cardiac pacemaker    Inactive  . TIA (transient ischemic attack) 2011   No deficits    Assessment: 67 y/o M with a h/o atrial fibrillation on warfarin 7.5 mg daily PTA admitted with epiglottitis and intubated. Vitamin K and KCentra ordered with elevated INR for possible trach but not administered.   Patient extubated 3/7 and INR < 2.   DATE INR DOSE 3/7 1.87 3/8 1.67 Unable to take PO 3/9  1.54 Unable to take PO  3/10 1.47 Warfarin 82m 3/11 1.33 Warfarin 460m3/12 1.06  Goal of Therapy:  INR 2-3   Plan:  Continue Lovenox for DVT prophylaxis. Per RN, patient should be able to take oral medications  Pt has been started on amiodarone. Due to  supratherapeutic INR on admission plus the addition of amiodarone will not give patient's home dose. Will order warfarin 41m241m 1 dose today  F/u INR in am  ShePernell DupreharmD, BCPS Clinical Pharmacist 04/07/2017 7:20 AM

## 2017-04-07 NOTE — Consult Note (Signed)
Was more awake yesterday. Confused, delirious with hallucinations.    Past Medical History:  Diagnosis Date  . A-fib ([place])   . CHF (congestive heart failure) ([place])   . Degenerative disc disease, lumbar    s/p injury  . Diabetes mellitus without complication ([place])   . Disseminated superficial actinic porokeratosis   . Dysrhythmia    A-FIB, palpatations  . Hypercholesteremia   . Hypertension   . Kidney stones   . Peripheral vascular disease ([place])    Carotid stenosis  . Presence of permanent cardiac pacemaker    Inactive  . TIA (transient ischemic attack) 2011   No deficits    Past Surgical History:  Procedure Laterality Date  . APPENDECTOMY  2004  . CARDIAC CATHETERIZATION    . CARDIAC PACEMAKER PLACEMENT  2005  . CATARACT EXTRACTION W/PHACO Right 06/14/2014   Procedure: CATARACT EXTRACTION PHACO AND INTRAOCULAR LENS PLACEMENT (IOC);  Surgeon: Leandrew Koyanagi, MD;  Location: [facility];  Service: Ophthalmology;  Laterality: Right;  . CYSTOSCOPY W/ RETROGRADES Bilateral 08/09/2014   Procedure: CYSTOSCOPY WITH RETROGRADE PYELOGRAM;  Surgeon: Hollice Espy, MD;  Location: [facility];  Service: Urology;  Laterality: Bilateral;  . INTUBATION-ENDOTRACHEAL WITH TRACHEOSTOMY STANDBY  03/30/2017   Procedure: INTUBATION-ENDOTRACHEAL WITH TRACHEOSTOMY STANDBY;  Surgeon: Carloyn Manner, MD;  Location: [facility];  Service: ENT;;    Family History  Adopted: Yes  Problem Relation Age of Onset  . Heart disease Mother   . Fibromyalgia Sister   . Hypertension Daughter   . Migraines Daughter   . Kidney disease Neg Hx   . Prostate cancer Neg Hx   . Bladder Cancer Neg Hx     Social History:  reports that  has never smoked. he has never used smokeless tobacco. He reports that he does not drink alcohol or use drugs.  Allergies  Allergen Reactions  . Clindamycin/Lincomycin     Chest discomfort  . Sulfa Antibiotics Hives  . Sulfasalazine Hives    Medications: I have  reviewed the patient's current medications.   Physical Examination: Blood pressure (!) 147/75, pulse 80, temperature 97.7 F (36.5 C), temperature source Oral, resp. rate 15, height 5' 8"  (1.727 m), weight 273 lb 12.8 oz (124.2 kg), SpO2 98 %.    Neurological Examination   Mental Status: Alert to name only.  Cranial Nerves: II: Discs flat bilaterally; Visual fields grossly normal, pupils equal, round, reactive to light and accommodation III,IV, VI: ptosis not present, extra-ocular motions intact bilaterally V,VII: smile symmetric, facial light touch sensation normal bilaterally VIII: hearing normal bilaterally IX,X: gag reflex present XI: bilateral shoulder shrug XII: midline tongue extension Motor: Right : Upper extremity   5/5    Left:     Upper extremity   5/5  Lower extremity   5/5     Lower extremity   5/5 Tone and bulk:normal tone throughout; no atrophy noted Sensory: Pinprick and light touch intact throughout, bilaterally Deep Tendon Reflexes: 1+ and symmetric throughout Plantars: Right: downgoing   Left: downgoing Cerebellar: Not tested Gait: not tested      Laboratory Studies:   Basic Metabolic Panel: Recent Labs  Lab 04/02/17 0415 04/03/17 0411 04/04/17 0536 04/05/17 0520 04/06/17 0902  NA 145 142 141 138 135  K 4.3 3.8 4.4 4.6 3.5  CL 108 101 102 102 100*  CO2 29 30 29 28 22   GLUCOSE 216* 197* 344* 271* 243*  BUN 34* 34* 45* 42* 44*  CREATININE 1.33* 1.47* 1.46* 1.32* 1.34*  CALCIUM 8.5*  8.5* 8.6* 8.6* 8.8*  MG  --   --  2.2  --   --   PHOS  --   --  2.7  --   --     Liver Function Tests: Recent Labs  Lab 04/04/17 0536  AST 77*  ALT 50  ALKPHOS 58  BILITOT 1.2  PROT 7.0  ALBUMIN 2.9*   No results for input(s): LIPASE, AMYLASE in the last 168 hours. No results for input(s): AMMONIA in the last 168 hours.  CBC: Recent Labs  Lab 04/04/17 0536 04/06/17 0902  WBC 7.2 9.8  HGB 14.1 15.1  HCT 42.2 43.6  MCV 100.8* 98.0  PLT 159 150     Cardiac Enzymes: Recent Labs  Lab 04/03/17 0110 04/03/17 0647 04/03/17 1500  TROPONINI <0.03 <0.03 <0.03    BNP: Invalid input(s): POCBNP  CBG: Recent Labs  Lab 04/06/17 1146 04/06/17 1654 04/06/17 2201 04/07/17 0723 04/07/17 1127  GLUCAP 273* 248* 240* 187* 246*    Microbiology: Results for orders placed or performed during the hospital encounter of 03/30/17  Group A Strep by PCR ([facility] Only)     Status: None   Collection Time: 03/30/17  5:38 AM  Result Value Ref Range Status   Group A Strep by PCR NOT DETECTED NOT DETECTED Final    Comment: Performed at [facility], [address] [place], [place] [address]  MRSA PCR Screening     Status: None   Collection Time: 03/30/17  9:23 AM  Result Value Ref Range Status   MRSA by PCR NEGATIVE NEGATIVE Final    Comment:        The GeneXpert MRSA Assay (FDA approved for NASAL specimens only), is one component of a comprehensive MRSA colonization surveillance program. It is not intended to diagnose MRSA infection nor to guide or monitor treatment for MRSA infections. Performed at [facility], [address] [place], [place] [address]     Coagulation Studies: Recent Labs    04/05/17 0520 04/06/17 0902 04/07/17 0606  LABPROT 17.7* 16.4* 13.7  INR 1.47 1.33 1.06    Urinalysis: No results for input(s): COLORURINE, LABSPEC, PHURINE, GLUCOSEU, HGBUR, BILIRUBINUR, KETONESUR, PROTEINUR, UROBILINOGEN, NITRITE, LEUKOCYTESUR in the last 168 hours.  Invalid input(s): APPERANCEUR  Lipid Panel:     Component Value Date/Time   CHOL 105 05/24/2015 1058   TRIG 136 05/24/2015 1058   HDL 36 (L) 05/24/2015 1058   CHOLHDL 2.9 05/24/2015 1058   LDLCALC 42 05/24/2015 1058    HgbA1C:  Lab Results  Component Value Date   HGBA1C 8.6 (H) 04/01/2017    Urine Drug Screen:  No results found for: LABOPIA, COCAINSCRNUR, LABBENZ, AMPHETMU, THCU, LABBARB  Alcohol Level: No results for input(s): ETH in  the last 168 hours.  Other results: EKG: normal EKG, normal sinus rhythm, unchanged from previous tracings.  Imaging: No results found.   Assessment/Plan:   67 y.o. male with epiglotitis with respiratory distress s/p extubation.  Pt has been confused and disoriented.   Was more awake yesterday. Confused, delirious with hallucinations.    Started seoquel last night at 25 qhs Will increase to 25 during daytime and 64m at night Discussed with wife at bedside.  Will obtain EKG to look at QTC interval as don't see a recent one.     04/07/2017, 2:52 PM

## 2017-04-07 NOTE — Progress Notes (Signed)
[facility] NAME: Charls Custer    MR#:  [id number]  DATE OF BIRTH:  [birth year]  SUBJECTIVE:   Patient originally admitted for epiglottitis and was intubated for airway protection. Patient has since then been extubated and postextubation patient developed agitation/delirium.  Patient continues to have significant sundowning and not sleeping at night and still confused intermittently. Patient is more alert and awake this morning.  REVIEW OF SYSTEMS:    Review of Systems  Constitutional: Negative for chills and fever.  HENT: Negative for congestion and tinnitus.   Eyes: Negative for blurred vision and double vision.  Respiratory: Negative for cough, shortness of breath and wheezing.   Cardiovascular: Negative for chest pain, orthopnea and PND.  Gastrointestinal: Negative for abdominal pain, diarrhea, nausea and vomiting.  Genitourinary: Negative for dysuria and hematuria.  Neurological: Positive for weakness. Negative for dizziness, sensory change and focal weakness.  All other systems reviewed and are negative.   Nutrition: Heart healthy/Carb modified Tolerating Diet: Yes Tolerating PT: Eval noted.   DRUG ALLERGIES:   Allergies  Allergen Reactions  . Clindamycin/Lincomycin     Chest discomfort  . Sulfa Antibiotics Hives  . Sulfasalazine Hives    VITALS:  Blood pressure (!) 147/75, pulse 80, temperature 97.7 F (36.5 C), temperature source Oral, resp. rate 15, height 5' 8"  (1.727 m), weight 124.2 kg (273 lb 12.8 oz), SpO2 98 %.  PHYSICAL EXAMINATION:   Physical Exam  GENERAL:  67 y.o.-year-old patient lying in bed in NAD. EYES: Pupils equal, round, reactive to light. No scleral icterus. Extraocular muscles intact.  HEENT: Head atraumatic, normocephalic. Oropharynx and nasopharynx clear.  NECK:  Supple, no jugular venous distention. No thyroid enlargement, no tenderness.  LUNGS: Normal breath sounds bilaterally, no  wheezing, rales, rhonchi. No use of accessory muscles of respiration.  CARDIOVASCULAR: S1, S2 normal. No murmurs, rubs, or gallops.  ABDOMEN: Soft, nontender, nondistended. Bowel sounds present. No organomegaly or mass.  EXTREMITIES: No cyanosis, clubbing or edema b/l.    NEUROLOGIC: Cranial nerves II through XII are intact. No focal Motor or sensory deficits b/l.  Globally weak. PSYCHIATRIC: The patient is alert and oriented x 2.  SKIN: No obvious rash, lesion, or ulcer.    LABORATORY PANEL:   CBC Recent Labs  Lab 04/06/17 0902  WBC 9.8  HGB 15.1  HCT 43.6  PLT 150   ------------------------------------------------------------------------------------------------------------------  Chemistries  Recent Labs  Lab 04/04/17 0536  04/06/17 0902  NA 141   < > 135  K 4.4   < > 3.5  CL 102   < > 100*  CO2 29   < > 22  GLUCOSE 344*   < > 243*  BUN 45*   < > 44*  CREATININE 1.46*   < > 1.34*  CALCIUM 8.6*   < > 8.8*  MG 2.2  --   --   AST 77*  --   --   ALT 50  --   --   ALKPHOS 58  --   --   BILITOT 1.2  --   --    < > = values in this interval not displayed.   ------------------------------------------------------------------------------------------------------------------  Cardiac Enzymes Recent Labs  Lab 04/03/17 1500  TROPONINI <0.03   ------------------------------------------------------------------------------------------------------------------  RADIOLOGY:  No results found.   ASSESSMENT AND PLAN:   67 year old male with past medical history of hypertension, hyperlipidemia, diabetes, CHF, chronic atrial fibrillation, previous history of TIA was admitted to the hospital  due to severe epiglottitis and needed urgent intubation for airway protection, now has become delirious with altered mental status.  1. Altered mental status-this is metabolic encephalopathy and likely ICU delirium. CT head was negative for acute pathology, seen by neurology and they did not  recommend any further intervention other than supportive care. - Decadron was d/c yesterday to see if this was contributing to delirium. Keep patient awake during the days, Sleep at night. - avoid Benzo's for agitation and use haldol as needed - cont. Seroquel and will advance dose as per Neurology.   2. epiglottitis-now resolved. Patient having no acute respiratory issues.  Patient has received 8 days of IV Zosyn and cont. oral Augmentin for 2 more days.  - off steroids now.   3.history of chronic atrial fibrillation-rate controlled. Continue amiodarone,metoprolol, Cardizem -Continue Coumadin.  4. Diabetes type 2 without complication-continue Levemir but will lower dose as off steroids now.  - cont. sliding scale insulin, metformin, Glipizide. Follow BS.  5. Hyperlipidemia-continue Pravachol.  Seen by physical therapy and recommended short-term rehabilitation, social work aware.   All the records are reviewed and case discussed with Care Management/Social Worker. Management plans discussed with the patient, family and they are in agreement.  CODE STATUS: full  DVT Prophylaxis: Coumadin  TOTAL TIME TAKING CARE OF THIS PATIENT: 30 minutes.   POSSIBLE D/C IN 1-2 DAYS, DEPENDING ON CLINICAL CONDITION.   Henreitta Leber M.D on 04/07/2017 at 3:06 PM  Between 7am to 6pm - Pager - [phone]  After 6pm go to [email or web address] - [facility]  Office  [phone]  CC: Primary care physician; [name], [place]

## 2017-04-07 NOTE — Progress Notes (Signed)
Physical Therapy Treatment Patient Details Name: Christian Berg MRN: [id number] DOB: April 27, 1950 Today's Date: 04/07/2017    History of Present Illness 67 y/o male here after swelling in throat, needed intubation, has been lathargic, delirious, agitated t/o hospitalization. medical history of A-fib ([place]), CHF (congestive heart failure) ([place]), Degenerative disc disease, lumbar, Diabetes mellitus without complication ([place]), Disseminated superficial actinic porokeratosis, Dysrhythmia, Hypercholesteremia, Hypertension, Kidney stones, Peripheral vascular disease ([place]), Presence of permanent cardiac pacemaker, and TIA (transient ischemic attack) (2011).    PT Comments    Pt agreeable to PT although lethargic. Pt denies pain initially until attempting to sit up to edge of bed; pt has complaints of pain on bottom due to open sore. Pt participates with supine exercises initially with assist as needed. Pt does require some additional verbal/tactile cueing and time for tasks (sundowning per chart at this time). Pt able to demonstrate improved ability to sit to edge of bed with heavy cues and increased time with less assist this session (Mod A), but only tolerates for approximately 10 seconds due to bottom pain. Pt's posture is with posterolateral lean to the R. Min A for rolling in bed and Mod A for scooting upward for positioning. Continue PT to progress strength/endurance to improve all functional mobility. May have improved performance earlier in the day.    Follow Up Recommendations  SNF     Equipment Recommendations       Recommendations for Other Services       Precautions / Restrictions Precautions Precautions: Other (comment)(L sh with plate and screws) Restrictions Weight Bearing Restrictions: No Other Position/Activity Restrictions: Pillow under LUE for support    Mobility  Bed Mobility Overal bed mobility: Needs Assistance Bed Mobility: Supine to Sit;Sit to Supine;Rolling Rolling:  Min assist   Supine to sit: Mod assist;HOB elevated Sit to supine: Mod assist;HOB elevated   General bed mobility comments: Heavy cues for sequence  Transfers                    Ambulation/Gait                 Stairs            Wheelchair Mobility    Modified Rankin (Stroke Patients Only)       Balance Overall balance assessment: Needs assistance Sitting-balance support: Bilateral upper extremity supported Sitting balance-Leahy Scale: Poor Sitting balance - Comments: only tolerates for approximately 10 sec with posterior/R lateral lean Postural control: Posterior lean;Right lateral lean                                  Cognition Arousal/Alertness: Lethargic Behavior During Therapy: WFL for tasks assessed/performed;Flat affect Overall Cognitive Status: Impaired/Different from baseline                                 General Comments: Follows command with some need for increased cueing/time. Chart notes pt sundowns at this time of day.       Exercises General Exercises - Lower Extremity Ankle Circles/Pumps: AROM;Both;20 reps;Supine Quad Sets: Strengthening;Both;10 reps;Supine Gluteal Sets: Strengthening;Both;10 reps;Supine Short Arc Quad: AROM;Both;15 reps;Supine Heel Slides: AROM;Strengthening;Both;15 reps;Supine(light resisted ext) Hip ABduction/ADduction: AAROM;Both;15 reps;Supine(primarily to maintain LE elevated from bed) Straight Leg Raises: AAROM;Both;10 reps;Supine Other Exercises Other Exercises: Bridging for scooting in bed    General Comments  Pertinent Vitals/Pain Pain Assessment: No/denies pain    Home Living                      Prior Function            PT Goals (current goals can now be found in the care plan section) Progress towards PT goals: Progressing toward goals(slowly)    Frequency    Min 2X/week      PT Plan Current plan remains appropriate     Co-evaluation              AM-PAC PT "6 Clicks" Daily Activity  Outcome Measure  Difficulty turning over in bed (including adjusting bedclothes, sheets and blankets)?: Unable Difficulty moving from lying on back to sitting on the side of the bed? : Unable Difficulty sitting down on and standing up from a chair with arms (e.g., wheelchair, bedside commode, etc,.)?: Unable Help needed moving to and from a bed to chair (including a wheelchair)?: Total Help needed walking in hospital room?: Total Help needed climbing 3-5 steps with a railing? : Total 6 Click Score: 6    End of Session   Activity Tolerance: Patient limited by fatigue;Patient limited by lethargy;Patient limited by pain;Other (comment)(Pain due to sore on bottom) Patient left: in bed;with call bell/phone within reach;with bed alarm set;with family/visitor present;with SCD's reapplied   PT Visit Diagnosis: Muscle weakness (generalized) (M62.81);Difficulty in walking, not elsewhere classified (R26.2)     Time: 9794-8016 PT Time Calculation (min) (ACUTE ONLY): 28 min  Charges:  $Therapeutic Exercise: 8-22 mins $Therapeutic Activity: 8-22 mins                    G Codes:        Larae Grooms, PTA 04/07/2017, 4:55 PM

## 2017-04-07 NOTE — Plan of Care (Signed)
Remains incontinent.  MASD to perineal/buttocks noted.  Barrier cream applied to tender skin.

## 2017-04-07 NOTE — Progress Notes (Signed)
Nutrition Follow-up  DOCUMENTATION CODES:   Morbid obesity  INTERVENTION:   Premier Protein BID, each supplement provides 160 kcal and 30 grams of protein.   Assist with meals   NUTRITION DIAGNOSIS:   Inadequate oral intake related to inability to eat as evidenced by NPO status.  -resolving   GOAL:   Patient will meet greater than or equal to 90% of their needs  -progressing  MONITOR:   PO intake, Supplement acceptance, Weight trends, Labs, I & O's  ASSESSMENT:   67 year old male with PMHx of HTN, PVD, CHF, hx TIA, hypercholesteremia, degenerative disc disease, DM type 2, A-fib, s/p cardiac pacemaker placement who presented with throat pain and swelling, began having difficulty breathing and inability to swallow secretions found to have severe epiglottitis requiring emergent intubation 3/4.  Pt with AMS. Pt initiated on regular diet yesterday; pt ate 90% of breakfast this morning. RD will add Premier Protein to help pt meet his estimated needs. Per chart, pt with 23lb(8%) wt loss since admit. Pt noted to have improving edema; some wt loss likely r/t fluid changes. Pt requiring assistance with meals r/t weakness. RD will continue to monitor weights.   Medications reviewed and include: augmentin, aspirin, lovenox, pepcid, glipizide, insulin, metformin, wafarin  Labs reviewed: K 3.5 wnl, Cl 100(L), BUN 44(H), creat 1.34(H), Ca 8.8(L) P 2.7 wnl, Mg 2.2 wnl cbgs- 344, 271, 243 x 48hrs AIC 8.6(H)- 3/6  Diet Order:  Diet heart healthy/carb modified Room service appropriate? 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  Estimated Nutritional Needs:   Kcal:  2200-2500kcal/day   Protein:  124-137g/day   Fluid:  >2.2L/day   Koleen Distance MS, RD, LDN Pager #[phone] After Hours Pager: [phone]

## 2017-04-08 LAB — GLUCOSE, CAPILLARY
Glucose-Capillary: 153 mg/dL — ABNORMAL HIGH (ref 65–99)
Glucose-Capillary: 201 mg/dL — ABNORMAL HIGH (ref 65–99)
Glucose-Capillary: 115 mg/dL — ABNORMAL HIGH (ref 65–99)
Glucose-Capillary: 130 mg/dL — ABNORMAL HIGH (ref 65–99)

## 2017-04-08 LAB — PROTIME-INR
INR: 1.44
Prothrombin Time: 17.4 seconds — ABNORMAL HIGH (ref 11.4–15.2)

## 2017-04-08 MED ORDER — WARFARIN SODIUM 5 MG PO TABS
5.0000 mg | ORAL_TABLET | Freq: Once | ORAL | Status: AC
  Administered 2017-04-08: 5 mg via ORAL
  Filled 2017-04-08: qty 1

## 2017-04-08 MED ORDER — HALOPERIDOL LACTATE 5 MG/ML IJ SOLN: 2.5000 mg | Freq: Four times a day (QID) | INTRAMUSCULAR | Status: DC | PRN

## 2017-04-08 NOTE — Clinical Social Work Note (Signed)
CSW presented bed offers to patient's wife and she chose [facility].  CSW contacted [facility] who can accept patient once insurance Josem Kaufmann has been approved.  CSW to continue to follow patient's progress throughout discharge planning.  Jones Broom. [place], MSW, [place]  04/08/2017 5:00 PM

## 2017-04-08 NOTE — Clinical Social Work Note (Signed)
Clinical Social Work Assessment  Patient Details  Name: Christian Berg MRN: [id number] Date of Birth: [birth year]  Date of referral:  04/08/17               Reason for consult:  Facility Placement                Permission sought to share information with:  Family Supports, Customer service manager Permission granted to share information::  Yes, Verbal Permission Granted  Name::     Laron, [place] [phone]   Agency::  SNF admissions  Relationship::     Contact Information:     Housing/Transportation Living arrangements for the past 2 months:  Single Family Home Source of Information:  Spouse Patient Interpreter Needed:  None Criminal Activity/Legal Involvement Pertinent to Current Situation/Hospitalization:  No - Comment as needed Significant Relationships:  Adult Children, Spouse Lives with:  Spouse Do you feel safe going back to the place where you live?  No Need for family participation in patient care:  Yes (Comment)  Care giving concerns:  Patient's family feels he needs some short term rehab before he is able to return back home.   Social Worker assessment / plan:  Patient is a 67 year old male who is alert and oriented x2.  Patient has some confusion, and due to confusion CSW completed assessment by speaking with his wife.  Patient was independent at home, until the recent hospitalization.  Patient has not been to rehab before, patient is confused about what is going on.  CSW explained to patient's wife the process for looking for SNF and how insurance will pay for stay pending authorization.  Patient's wife was explained what to expect at SNF and the stops to get him set up with home health after going to SNF.  Patient's wife gave CSW permission to begin bed search in [place].  Patient's wife did not express any other questions.  Employment status:  Retired Nurse, adult PT Recommendations:  [place] / Referral to community resources:  [place]  Patient/Family's Response to care:  Patient and family are agreeable to going to SNF for short term rehab.  Patient/Family's Understanding of and Emotional Response to Diagnosis, Current Treatment, and Prognosis:  Patient's family is aware of current treatment plan.  Emotional Assessment Appearance:  Appears stated age Attitude/Demeanor/Rapport:    Affect (typically observed):  Appropriate, Stable Orientation:  Oriented to Place, Oriented to Self Alcohol / Substance use:  Not Applicable Psych involvement (Current and /or in the community):  No (Comment)  Discharge Needs  Concerns to be addressed:  Lack of Support Readmission within the last 30 days:  No Current discharge risk:  Lack of support system Barriers to Discharge:  Ship broker, Continued Medical Work up   Anell Barr 04/08/2017, 7:31 PM

## 2017-04-08 NOTE — Progress Notes (Signed)
[facility] at [place] NAME: Christian Berg    MR#:  [id number]  DATE OF BIRTH:  [birth year]  SUBJECTIVE:   Patient originally admitted for epiglottitis and was intubated for airway protection. Patient has since then been extubated and postextubation patient developed agitation/delirium.  Still having episodes of delirium/altered mental status but improved over the past few days. Wife at bedside updated on the plan of care.  REVIEW OF SYSTEMS:    Review of Systems  Constitutional: Negative for chills and fever.  HENT: Negative for congestion and tinnitus.   Eyes: Negative for blurred vision and double vision.  Respiratory: Negative for cough, shortness of breath and wheezing.   Cardiovascular: Negative for chest pain, orthopnea and PND.  Gastrointestinal: Negative for abdominal pain, diarrhea, nausea and vomiting.  Genitourinary: Negative for dysuria and hematuria.  Neurological: Positive for weakness. Negative for dizziness, sensory change and focal weakness.  All other systems reviewed and are negative.   Nutrition: Heart healthy/Carb modified Tolerating Diet: Yes Tolerating PT: Eval noted.   DRUG ALLERGIES:   Allergies  Allergen Reactions  . Clindamycin/Lincomycin     Chest discomfort  . Sulfa Antibiotics Hives  . Sulfasalazine Hives    VITALS:  Blood pressure (!) 116/59, pulse (!) 57, temperature 97.7 F (36.5 C), temperature source Oral, resp. rate 18, height 5' 8"  (1.727 m), weight 123.3 kg (271 lb 12.8 oz), SpO2 100 %.  PHYSICAL EXAMINATION:   Physical Exam  GENERAL:  67 y.o.-year-old obese patient lying in bed in NAD. EYES: Pupils equal, round, reactive to light. No scleral icterus. Extraocular muscles intact.  HEENT: Head atraumatic, normocephalic. Oropharynx and nasopharynx clear.  NECK:  Supple, no jugular venous distention. No thyroid enlargement, no tenderness.  LUNGS: Normal breath sounds bilaterally, no  wheezing, rales, rhonchi. No use of accessory muscles of respiration.  CARDIOVASCULAR: S1, S2 normal. No murmurs, rubs, or gallops.  ABDOMEN: Soft, nontender, nondistended. Bowel sounds present. No organomegaly or mass.  EXTREMITIES: No cyanosis, clubbing or edema b/l.    NEUROLOGIC: Cranial nerves II through XII are intact. No focal Motor or sensory deficits b/l.  Globally weak. PSYCHIATRIC: The patient is alert and oriented x 2.  SKIN: No obvious rash, lesion, or ulcer.    LABORATORY PANEL:   CBC Recent Labs  Lab 04/06/17 0902  WBC 9.8  HGB 15.1  HCT 43.6  PLT 150   ------------------------------------------------------------------------------------------------------------------  Chemistries  Recent Labs  Lab 04/04/17 0536  04/06/17 0902  NA 141   < > 135  K 4.4   < > 3.5  CL 102   < > 100*  CO2 29   < > 22  GLUCOSE 344*   < > 243*  BUN 45*   < > 44*  CREATININE 1.46*   < > 1.34*  CALCIUM 8.6*   < > 8.8*  MG 2.2  --   --   AST 77*  --   --   ALT 50  --   --   ALKPHOS 58  --   --   BILITOT 1.2  --   --    < > = values in this interval not displayed.   ------------------------------------------------------------------------------------------------------------------  Cardiac Enzymes Recent Labs  Lab 04/03/17 1500  TROPONINI <0.03   ------------------------------------------------------------------------------------------------------------------  RADIOLOGY:  No results found.   ASSESSMENT AND PLAN:   67 year old male with past medical history of hypertension, hyperlipidemia, diabetes, CHF, chronic atrial fibrillation, previous history of TIA was admitted to  the hospital due to severe epiglottitis and needed urgent intubation for airway protection, now has become delirious with altered mental status.  1. Altered mental status-this is metabolic encephalopathy and likely ICU delirium. CT head was negative for acute pathology, seen by neurology and they did not  recommend any further intervention other than supportive care. -Patient is off Decadron, we'll DC Ativan, use Haldol as needed for agitation. Continue Seroquel at bedtime. Keep awake during the days, sleep at night. Slow to improve but should improve in the next few days.  2. epiglottitis-now resolved. Patient having no acute respiratory issues.  Patient has received 8 days of IV Zosyn and cont. oral Augmentin for 2 more days.  - off steroids now.   3.history of chronic atrial fibrillation-rate controlled. Continue amiodarone,metoprolol -Continue Coumadin.  4. Diabetes type 2 without complication-continue Levemir   - cont. sliding scale insulin, metformin, Glipizide. Follow BS which are stable.  5. Hyperlipidemia-continue Pravachol.  Seen by physical therapy and recommended short-term rehabilitation.  Discussed plan of care with patient's wife the patient would likely go to short-term rehabilitation. She was in agreement with the plan and notified social worker to discuss bed options with the patient's wife.  All the records are reviewed and case discussed with Care Management/Social Worker. Management plans discussed with the patient, family and they are in agreement.  CODE STATUS: Full   DVT Prophylaxis: Coumadin  TOTAL TIME TAKING CARE OF THIS PATIENT: 30 minutes.   POSSIBLE D/C IN 1-2 DAYS, DEPENDING ON CLINICAL CONDITION.   Henreitta Leber M.D on 04/08/2017 at 3:04 PM  Between 7am to 6pm - Pager - [phone]  After 6pm go to [email or web address] - [facility]  Office  [phone]  CC: Primary care physician; [name], [place]

## 2017-04-08 NOTE — Plan of Care (Signed)
  Education: Knowledge of General Education information will improve 04/08/2017 1812 - Progressing by Darrelyn Hillock, RN   Education: Knowledge of General Education information will improve 04/08/2017 1812 - Progressing by Darrelyn Hillock, RN   Education: Knowledge of General Education information will improve 04/08/2017 1812 - Progressing by Darrelyn Hillock, RN   Education: Knowledge of General Education information will improve 04/08/2017 1812 - Progressing by Darrelyn Hillock, RN   Education: Knowledge of General Education information will improve 04/08/2017 1812 - Progressing by Darrelyn Hillock, RN   Education: Knowledge of General Education information will improve 04/08/2017 1812 - Progressing by Darrelyn Hillock, RN   Education: Knowledge of General Education information will improve 04/08/2017 1812 - Progressing by Darrelyn Hillock, RN   Education: Knowledge of General Education information will improve 04/08/2017 1812 - Progressing by Darrelyn Hillock, RN

## 2017-04-08 NOTE — Progress Notes (Signed)
[facility] for Warfarin Indication: atrial fibrillation  Allergies  Allergen Reactions  . Clindamycin/Lincomycin     Chest discomfort  . Sulfa Antibiotics Hives  . Sulfasalazine Hives    Patient Measurements: Height: 5' 8"  (172.7 cm) Weight: 271 lb 12.8 oz (123.3 kg) IBW/kg (Calculated) : 68.4   Vital Signs: Temp: 97.7 F (36.5 C) (03/13 0733) Temp Source: Oral (03/13 0733) BP: 116/59 (03/13 0733) Pulse Rate: 57 (03/13 0733)  Labs: Recent Labs    04/06/17 0902 04/07/17 0606 04/08/17 0600  HGB 15.1  --   --   HCT 43.6  --   --   PLT 150  --   --   LABPROT 16.4* 13.7 17.4*  INR 1.33 1.06 1.44  CREATININE 1.34*  --   --     Estimated Creatinine Clearance: 67.5 mL/min (A) (by C-G formula based on SCr of 1.34 mg/dL (H)).   Medical History: Past Medical History:  Diagnosis Date  . A-fib ([place])   . CHF (congestive heart failure) ([place])   . Degenerative disc disease, lumbar    s/p injury  . Diabetes mellitus without complication ([place])   . Disseminated superficial actinic porokeratosis   . Dysrhythmia    A-FIB, palpatations  . Hypercholesteremia   . Hypertension   . Kidney stones   . Peripheral vascular disease ([place])    Carotid stenosis  . Presence of permanent cardiac pacemaker    Inactive  . TIA (transient ischemic attack) 2011   No deficits    Assessment: 67 y/o M with a h/o atrial fibrillation on warfarin 7.5 mg daily PTA admitted with epiglottitis and intubated. Vitamin K and KCentra ordered with elevated INR for possible trach but not administered.   Patient extubated 3/7 and INR < 2.   DATE INR DOSE 3/7 1.87 3/8 1.67 Unable to take PO 3/9  1.54 Unable to take PO  3/10 1.47 Warfarin 62m 3/11 1.33 Warfarin 431m3/12 1.06     Warfarin 14m27m/13     1.44  Goal of Therapy:  INR 2-3   Plan:  Continue Lovenox for DVT prophylaxis. Per RN, patient should be able to take oral medications  Pt has been started on  amiodarone. Due to supratherapeutic INR on admission plus the addition of amiodarone will not give patient's home dose. Will order warfarin 14mg52m1 dose today  F/u INR in am  MeliRamond DialarmD, BCPS Clinical Pharmacist 04/08/2017 7:51 AM

## 2017-04-09 LAB — GLUCOSE, CAPILLARY
Glucose-Capillary: 66 mg/dL (ref 65–99)
Glucose-Capillary: 71 mg/dL (ref 65–99)
Glucose-Capillary: 196 mg/dL — ABNORMAL HIGH (ref 65–99)
Glucose-Capillary: 115 mg/dL — ABNORMAL HIGH (ref 65–99)
Glucose-Capillary: 185 mg/dL — ABNORMAL HIGH (ref 65–99)

## 2017-04-09 LAB — PROTIME-INR
INR: 1.67
Prothrombin Time: 19.6 seconds — ABNORMAL HIGH (ref 11.4–15.2)

## 2017-04-09 MED ORDER — WARFARIN SODIUM 5 MG PO TABS
5.0000 mg | ORAL_TABLET | Freq: Every day | ORAL | Status: DC
Start: 2017-04-09 — End: 2017-04-10
  Administered 2017-04-09: 5 mg via ORAL
  Filled 2017-04-09 (×2): qty 1

## 2017-04-09 MED ORDER — NYSTATIN 100000 UNIT/ML MT SUSP
5.0000 mL | Freq: Four times a day (QID) | OROMUCOSAL | Status: DC
  Administered 2017-04-09 – 2017-04-10 (×5): 500000 [IU] via ORAL
  Filled 2017-04-09 (×5): qty 5

## 2017-04-09 MED ORDER — QUETIAPINE FUMARATE 50 MG PO TABS: 50.0000 mg | ORAL_TABLET | Freq: Every day | ORAL | Status: DC

## 2017-04-09 MED ORDER — TRAMADOL-ACETAMINOPHEN 37.5-325 MG PO TABS: 2.0000 | ORAL_TABLET | Freq: Four times a day (QID) | ORAL | 0 refills | Status: DC

## 2017-04-09 MED ORDER — INSULIN DETEMIR 100 UNIT/ML ~~LOC~~ SOLN
18.0000 [IU] | Freq: Every day | SUBCUTANEOUS | Status: DC
  Administered 2017-04-10: 18 [IU] via SUBCUTANEOUS
  Filled 2017-04-09 (×2): qty 0.18

## 2017-04-09 MED ORDER — INSULIN DETEMIR 100 UNIT/ML ~~LOC~~ SOLN: 10.0000 [IU] | Freq: Every day | SUBCUTANEOUS | 11 refills | Status: DC

## 2017-04-09 MED ORDER — AMIODARONE HCL 200 MG PO TABS: 200.0000 mg | ORAL_TABLET | Freq: Every day | ORAL | Status: DC

## 2017-04-09 MED ORDER — NYSTATIN 100000 UNIT/ML MT SUSP: 5.0000 mL | Freq: Four times a day (QID) | OROMUCOSAL | 0 refills | Status: AC

## 2017-04-09 MED ORDER — WARFARIN SODIUM 5 MG PO TABS: 5.0000 mg | ORAL_TABLET | Freq: Every day | ORAL | Status: DC

## 2017-04-09 NOTE — Progress Notes (Addendum)
Patients blood sugar resulted to 66 this morning. Patient asymptomatic. Orange juice given with peanut butter graham crackers. Will recheck patients blood sugar in 15 minutes. Will continue to monitor.   Update: rechecked patients blood sugar. Resulted to 71. Patient still asymptomatic, stated his wife was going to bring him a biscuit because he was tired of hospital food. Holding scheduled metformin and glipizide. MD notified, will continue to monitor patient.

## 2017-04-09 NOTE — Discharge Summary (Addendum)
[facility] at [place] NAME: Christian Berg    MR#:  [id number]  DATE OF BIRTH:  1951-01-27  DATE OF ADMISSION:  03/30/2017 ADMITTING PHYSICIAN: Carloyn Manner, MD  DATE OF DISCHARGE: 04/10/2017  PRIMARY CARE PHYSICIAN: [name], PA    ADMISSION DIAGNOSIS:  Epiglottitis [J05.10]  DISCHARGE DIAGNOSIS:  Active Problems:   Epiglottitis   Pressure injury of skin   SECONDARY DIAGNOSIS:   Past Medical History:  Diagnosis Date  . A-fib ([place])   . CHF (congestive heart failure) ([place])   . Degenerative disc disease, lumbar    s/p injury  . Diabetes mellitus without complication ([place])   . Disseminated superficial actinic porokeratosis   . Dysrhythmia    A-FIB, palpatations  . Hypercholesteremia   . Hypertension   . Kidney stones   . Peripheral vascular disease ([place])    Carotid stenosis  . Presence of permanent cardiac pacemaker    Inactive  . TIA (transient ischemic attack) 2011   No deficits    HOSPITAL COURSE:   67 year old male with past medical history of hypertension, hyperlipidemia, diabetes, CHF, chronic atrial fibrillation, previous history of TIA was admitted to the hospital due to severe epiglottitis and needed urgent intubation for airway protection, now has become delirious with altered mental status.  1. Altered mental status-this was metabolic encephalopathy and likely ICU delirium. CT head was negative for acute pathology, pt. was seen by neurology and they did not recommend any further intervention other than supportive care. -Patient continued to have significant sundowning and therefore he was taken off the Decadron, his Ativan was discontinued and he was placed on some Haldol as needed for agitation.  Patient was also in the intensive care unit initially on a Precedex drip which has also now been weaned off. -Patient's delirium/mental status has significantly improved over the past 48 hours.  He is currently  alert and oriented.  He will continue a small dose of Seroquel at bedtime which he is tolerating well.  2. epiglottitis- patient was initially admitted for this and had a airway compromise and was initially intubated.  He has now been extubated over the past 4-5 days and has no respiratory issues or airway compromise.  He was treated with IV Decadron which was stopped due to delirium/mental status change.  He has finished adequate antibiotic therapy for his epiglottitis and is currently stable.    3.history of chronic atrial fibrillation-rate controlled. Pt. Will Continue amiodarone,metoprolol - he will Continue Coumadin.  4. Diabetes type 2 without complication- patient's blood sugars were somewhat labile due to steroids he was receiving.  They have improved.  He will continue his metformin, glipizide.  He is also being discharged on a small dose of Levemir.  Further titrations to his diabetic regimen can be done at the skilled nursing facility or as outpatient.  5. Hyperlipidemia- he will continue Pravachol.  6.  Oral thrush-patient is being discharged on nystatin swish and swallow  7.  Skin fungal infection-patient will continue his nystatin cream applied to his inguinal areas b/l.  - cont. Supportive care.   Pt. Is being discharged to SNF   DISCHARGE CONDITIONS:   Stable.   CONSULTS OBTAINED:  Treatment Team:  Leotis Pain, MD  DRUG ALLERGIES:   Allergies  Allergen Reactions  . Clindamycin/Lincomycin     Chest discomfort  . Sulfa Antibiotics Hives  . Sulfasalazine Hives    DISCHARGE MEDICATIONS:   Allergies as of 04/09/2017  Reactions   Clindamycin/lincomycin    Chest discomfort   Sulfa Antibiotics Hives   Sulfasalazine Hives      Medication List    STOP taking these medications   diltiazem 60 MG tablet Commonly known as:  CARDIZEM   doxycycline 100 MG tablet Commonly known as:  VIBRA-TABS   gabapentin 300 MG capsule Commonly known as:   NEURONTIN   oseltamivir 75 MG capsule Commonly known as:  TAMIFLU     TAKE these medications   amiodarone 200 MG tablet Commonly known as:  PACERONE Take 1 tablet (200 mg total) by mouth daily. Start taking on:  04/10/2017   aspirin EC 81 MG tablet Take 81 mg by mouth every evening.   cyclobenzaprine 5 MG tablet Commonly known as:  FLEXERIL Take 5 mg by mouth at bedtime for spasms.   glipiZIDE 10 MG tablet Commonly known as:  GLUCOTROL Take 1 tablet (10 mg total) by mouth 2 (two) times daily before a meal. Take 30 minutes before a meal   insulin detemir 100 UNIT/ML injection Commonly known as:  LEVEMIR Inject 0.1 mLs (10 Units total) into the skin daily. Start taking on:  04/10/2017   lansoprazole 15 MG capsule Commonly known as:  PREVACID Take 15-30 mg by mouth daily as needed. For heartburn/reflux   lisinopril 20 MG tablet Commonly known as:  PRINIVIL,ZESTRIL TAKE ONE TABLET BY MOUTH ONCE DAILY   lovastatin 10 MG tablet Commonly known as:  MEVACOR Take 2 tablets (20 mg total) by mouth daily. What changed:  when to take this   metFORMIN 850 MG tablet Commonly known as:  GLUCOPHAGE TAKE 1 TABLET BY MOUTH TWICE DAILY WITH MEALS   metoprolol tartrate 100 MG tablet Commonly known as:  LOPRESSOR Take 1 tablet (100 mg total) by mouth at bedtime. What changed:    how much to take  when to take this   nystatin 100000 UNIT/ML suspension Commonly known as:  MYCOSTATIN Take 5 mLs (500,000 Units total) by mouth 4 (four) times daily for 5 days.   nystatin cream Commonly known as:  MYCOSTATIN Apply 1 application topically 2 (two) times daily. What changed:    when to take this  reasons to take this   QUEtiapine 50 MG tablet Commonly known as:  SEROQUEL Take 1 tablet (50 mg total) by mouth at bedtime.   traMADol-acetaminophen 37.5-325 MG tablet Commonly known as:  ULTRACET Take 2 tablets by mouth 4 (four) times daily.   warfarin 5 MG tablet Commonly known  as:  COUMADIN Take as directed. If you are unsure how to take this medication, talk to your nurse or doctor. Original instructions:  Take 1 tablet (5 mg total) by mouth daily. What changed:    medication strength  how much to take         DISCHARGE INSTRUCTIONS:   DIET:  Cardiac diet and Diabetic diet  DISCHARGE CONDITION:  Stable  ACTIVITY:  Activity as tolerated  OXYGEN:  Home Oxygen: No.   Oxygen Delivery: room air  DISCHARGE LOCATION:  nursing home   If you experience worsening of your admission symptoms, develop shortness of breath, life threatening emergency, suicidal or homicidal thoughts you must seek medical attention immediately by calling 911 or calling your MD immediately  if symptoms less severe.  You Must read complete instructions/literature along with all the possible adverse reactions/side effects for all the Medicines you take and that have been prescribed to you. Take any new Medicines after you have completely understood  and accpet all the possible adverse reactions/side effects.   Please note  You were cared for by a hospitalist during your hospital stay. If you have any questions about your discharge medications or the care you received while you were in the hospital after you are discharged, you can call the unit and asked to speak with the hospitalist on call if the hospitalist that took care of you is not available. Once you are discharged, your primary care physician will handle any further medical issues. Please note that NO REFILLS for any discharge medications will be authorized once you are discharged, as it is imperative that you return to your primary care physician (or establish a relationship with a primary care physician if you do not have one) for your aftercare needs so that they can reassess your need for medications and monitor your lab values.     Today   Mental status much improved.  NO acute events overnight.  Wife at bedside.  Will d/c to SNF today.   VITAL SIGNS:  Blood pressure (!) 137/114, pulse 100, temperature 97.6 F (36.4 C), temperature source Oral, resp. rate 16, height 5' 8"  (1.727 m), weight 122.5 kg (270 lb 1.6 oz), SpO2 100 %.  I/O:    Intake/Output Summary (Last 24 hours) at 04/09/2017 1358 Last data filed at 04/09/2017 1339 Gross per 24 hour  Intake 600 ml  Output 1980 ml  Net -1380 ml    PHYSICAL EXAMINATION:   GENERAL:  67 y.o.-year-old obese patient lying in bed in NAD. EYES: Pupils equal, round, reactive to light. No scleral icterus. Extraocular muscles intact.  HEENT: Head atraumatic, normocephalic. Oropharynx and nasopharynx clear. + mild Oral Thrush. NECK:  Supple, no jugular venous distention. No thyroid enlargement, no tenderness.  LUNGS: Normal breath sounds bilaterally, no wheezing, rales, rhonchi. No use of accessory muscles of respiration.  CARDIOVASCULAR: S1, S2 normal. No murmurs, rubs, or gallops.  ABDOMEN: Soft, nontender, nondistended. Bowel sounds present. No organomegaly or mass.  EXTREMITIES: No cyanosis, clubbing or edema b/l.    NEUROLOGIC: Cranial nerves II through XII are intact. No focal Motor or sensory deficits b/l.  Globally weak. PSYCHIATRIC: The patient is alert and oriented x 3.  SKIN: b/l inguinal area fungal skin rash, no lesion, or ulcer.    DATA REVIEW:   CBC Recent Labs  Lab 04/06/17 0902  WBC 9.8  HGB 15.1  HCT 43.6  PLT 150    Chemistries  Recent Labs  Lab 04/04/17 0536  04/06/17 0902  NA 141   < > 135  K 4.4   < > 3.5  CL 102   < > 100*  CO2 29   < > 22  GLUCOSE 344*   < > 243*  BUN 45*   < > 44*  CREATININE 1.46*   < > 1.34*  CALCIUM 8.6*   < > 8.8*  MG 2.2  --   --   AST 77*  --   --   ALT 50  --   --   ALKPHOS 58  --   --   BILITOT 1.2  --   --    < > = values in this interval not displayed.    Cardiac Enzymes Recent Labs  Lab 04/03/17 1500  TROPONINI <0.03    Microbiology Results  Results for orders placed or  performed during the hospital encounter of 03/30/17  Group A Strep by PCR ([facility] Only)     Status: None  Collection Time: 03/30/17  5:38 AM  Result Value Ref Range Status   Group A Strep by PCR NOT DETECTED NOT DETECTED Final    Comment: Performed at [facility], [address] [place], [place] [address]  MRSA PCR Screening     Status: None   Collection Time: 03/30/17  9:23 AM  Result Value Ref Range Status   MRSA by PCR NEGATIVE NEGATIVE Final    Comment:        The GeneXpert MRSA Assay (FDA approved for NASAL specimens only), is one component of a comprehensive MRSA colonization surveillance program. It is not intended to diagnose MRSA infection nor to guide or monitor treatment for MRSA infections. Performed at [facility], [address], [place], [place] [address]     RADIOLOGY:  No results found.    Management plans discussed with the patient, family and they are in agreement.  CODE STATUS:     Code Status Orders  (From admission, onward)        Start     Ordered   03/30/17 0925  Full code  Continuous     03/30/17 0924  Advance Directive Documentation     Most Recent Value  Type of Advance Directive  Living will, Healthcare Power of Attorney  Pre-existing out of facility DNR order (yellow form or pink MOST form)  No data  "MOST" Form in Place?  No data      TOTAL TIME TAKING CARE OF THIS PATIENT: 40 minutes.    Henreitta Leber M.D on 04/09/2017 at 1:58 PM  Between 7am to 6pm - Pager - [phone]  After 6pm go to [email or web address] - [employer] [facility] Hospital[facility]  Office  [phone]  CC: Primary care physician; [name], [place]

## 2017-04-09 NOTE — Progress Notes (Signed)
Notified MD of Bp 121/52. Will be holding Metoprolol 144m. Will continue to monitor and assess.

## 2017-04-09 NOTE — Progress Notes (Signed)
Physical Therapy Treatment Patient Details Name: Christian Berg MRN: [id number] DOB: 11/11/1950 Today's Date: 04/09/2017    History of Present Illness 67 y/o male here after swelling in throat, needed intubation, has been lathargic, delirious, agitated t/o hospitalization. medical history of A-fib ([place]), CHF (congestive heart failure) ([place]), Degenerative disc disease, lumbar, Diabetes mellitus without complication ([place]), Disseminated superficial actinic porokeratosis, Dysrhythmia, Hypercholesteremia, Hypertension, Kidney stones, Peripheral vascular disease ([place]), Presence of permanent cardiac pacemaker, and TIA (transient ischemic attack) (2011).    PT Comments    Pt has received dinner, but agreeable to review exercises. Pt much more alert and conversational this session (an this late in day). Pt states he has been performing the exercises 2 x a day and is able to recall most. Pt participates in exercises x 10 each and reminded of isometric exercises pt unable to recall. Encouraged increasing repetitions as able and increasing frequency to 3 x per day. Pt agreeable. Continue PT to progress strength and endurance to improve all functional mobility.    Follow Up Recommendations        Equipment Recommendations       Recommendations for Other Services       Precautions / Restrictions Precautions Precautions: Other (comment) Precaution Comments: prefers pillow under LUE for support of shoulder Restrictions Weight Bearing Restrictions: No    Mobility  Bed Mobility               General bed mobility comments: Not tested; pt has received dinner  Transfers                    Ambulation/Gait                 Stairs            Wheelchair Mobility    Modified Rankin (Stroke Patients Only)       Balance                                            Cognition Arousal/Alertness: Awake/alert Behavior During Therapy: WFL for tasks  assessed/performed;Flat affect Overall Cognitive Status: Within Functional Limits for tasks assessed                                        Exercises General Exercises - Lower Extremity Ankle Circles/Pumps: AROM;Both;10 reps;Supine Quad Sets: Strengthening;Both;10 reps;Supine Gluteal Sets: Strengthening;Both;10 reps;Supine Short Arc Quad: AROM;Both;10 reps;Supine Heel Slides: AROM;Both;10 reps;Supine Hip ABduction/ADduction: AROM;Both;10 reps;Supine(tends to turn toes out) Straight Leg Raises: Other (comment)(reviewed verbally, but requires assist at this time)    General Comments        Pertinent Vitals/Pain Pain Assessment: No/denies pain    Home Living                      Prior Function            PT Goals (current goals can now be found in the care plan section) Progress towards PT goals: Progressing toward goals    Frequency    Min 2X/week      PT Plan Current plan remains appropriate    Co-evaluation              AM-PAC PT "6 Clicks" Daily Activity  Outcome Measure  Difficulty  turning over in bed (including adjusting bedclothes, sheets and blankets)?: Unable Difficulty moving from lying on back to sitting on the side of the bed? : Unable Difficulty sitting down on and standing up from a chair with arms (e.g., wheelchair, bedside commode, etc,.)?: Unable Help needed moving to and from a bed to chair (including a wheelchair)?: Total Help needed walking in hospital room?: Total Help needed climbing 3-5 steps with a railing? : Total 6 Click Score: 6    End of Session   Activity Tolerance: Patient tolerated treatment well Patient left: in bed;with call bell/phone within reach;with bed alarm set   PT Visit Diagnosis: Muscle weakness (generalized) (M62.81);Difficulty in walking, not elsewhere classified (R26.2)     Time: 0300-9233 PT Time Calculation (min) (ACUTE ONLY): 14 min  Charges:  $Therapeutic Exercise: 8-22  mins                    G Codes:        Larae Grooms, PTA 04/09/2017, 5:19 PM

## 2017-04-09 NOTE — Progress Notes (Signed)
[facility] for Warfarin Indication: atrial fibrillation  Allergies  Allergen Reactions  . Clindamycin/Lincomycin     Chest discomfort  . Sulfa Antibiotics Hives  . Sulfasalazine Hives    Patient Measurements: Height: 5' 8"  (172.7 cm) Weight: 270 lb 1.6 oz (122.5 kg) IBW/kg (Calculated) : 68.4   Vital Signs: Temp: 97.6 F (36.4 C) (03/14 0756) Temp Source: Oral (03/14 0756) BP: 148/86 (03/14 0756) Pulse Rate: 100 (03/14 0756)  Labs: Recent Labs    04/06/17 0902 04/07/17 0606 04/08/17 0600 04/09/17 0634  HGB 15.1  --   --   --   HCT 43.6  --   --   --   PLT 150  --   --   --   LABPROT 16.4* 13.7 17.4* 19.6*  INR 1.33 1.06 1.44 1.67  CREATININE 1.34*  --   --   --     Estimated Creatinine Clearance: 67.2 mL/min (A) (by C-G formula based on SCr of 1.34 mg/dL (H)).   Medical History: Past Medical History:  Diagnosis Date  . A-fib ([place])   . CHF (congestive heart failure) ([place])   . Degenerative disc disease, lumbar    s/p injury  . Diabetes mellitus without complication ([place])   . Disseminated superficial actinic porokeratosis   . Dysrhythmia    A-FIB, palpatations  . Hypercholesteremia   . Hypertension   . Kidney stones   . Peripheral vascular disease ([place])    Carotid stenosis  . Presence of permanent cardiac pacemaker    Inactive  . TIA (transient ischemic attack) 2011   No deficits    Assessment: 67 y/o M with a h/o atrial fibrillation on warfarin 7.5 mg daily PTA admitted with epiglottitis and intubated. Vitamin K and KCentra ordered with elevated INR for possible trach but not administered.   Patient extubated 3/7 and INR < 2.   DATE INR DOSE 3/7 1.87 3/8 1.67 Unable to take PO 3/9  1.54 Unable to take PO  3/10 1.47 Warfarin 6m 3/11 1.33 Warfarin 460m3/12 1.06     Warfarin 15m29m/13     1.44     Warfarin 15mg30m14     1.67  Goal of Therapy:  INR 2-3   Plan:  Continue Lovenox for DVT prophylaxis. Per  RN, patient should be able to take oral medications  Pt has been started on amiodarone. Due to supratherapeutic INR on admission plus the addition of amiodarone will not give patient's home dose. Will continue with warfarin 15mg 15mly F/u INR in am  MelisRamond DialrmD, BCPS Clinical Pharmacist 04/09/2017 8:05 AM

## 2017-04-09 NOTE — Progress Notes (Addendum)
Inpatient Diabetes Program Recommendations  AACE/ADA: New Consensus Statement on Inpatient Glycemic Control (2015)  Target Ranges:  Prepandial:   less than 140 mg/dL      [facility] postprandial:   less than 180 mg/dL (1-2 hours)      Critically ill patients:  140 - 180 mg/dL   Results for Christian Berg, Christian WAYBRIGHT "RAY" (MRN [id number]) as of 04/09/2017 10:48  Ref. Range 04/08/2017 08:01 04/08/2017 11:37 04/08/2017 16:19 04/08/2017 20:21 04/09/2017 07:58 04/09/2017 08:39  Glucose-Capillary Latest Ref Range: 65 - 99 mg/dL 153 (H) 201 (H) 115 (H) 130 (H) 66 71   Review of Glycemic Control  Diabetes history: DM2 Outpatient Diabetes medications: Glipizide 10 mg BID, Metformin 850 mg BID Current orders for Inpatient glycemic control: Levemir 22 units daily, Novolog 0-20 units TID with meals, Novolog 0-5 units QHS, Glipizide 10 mg BID, Metformin 850 mg BID  Inpatient Diabetes Program Recommendations: Insulin-Basal: Please consider decreasing Levemir to 18 units daily.  Thanks, Barnie Alderman, RN, MSN, CDE Diabetes Coordinator Inpatient Diabetes Program [phone] (Team Pager from 8am to 5pm)

## 2017-04-09 NOTE — Plan of Care (Signed)
  Elimination: Will not experience complications related to urinary retention 04/09/2017 0111 - Progressing by Jeri Cos, RN

## 2017-04-09 NOTE — Care Management Important Message (Addendum)
Important Message  Patient Details  Name: CAIUS SILBERNAGEL MRN: [id number] Date of Birth: 1951-01-11   Medicare Important Message Given:  Yes Signed IM notice given to patient's wife as patient is confused   Katrina Stack, RN 04/09/2017, 8:40 AM

## 2017-04-10 ENCOUNTER — Ambulatory Visit: Payer: Medicare HMO

## 2017-04-10 DIAGNOSIS — L89152 Pressure ulcer of sacral region, stage 2: Secondary | ICD-10-CM | POA: Diagnosis not present

## 2017-04-10 DIAGNOSIS — R2681 Unsteadiness on feet: Secondary | ICD-10-CM | POA: Diagnosis not present

## 2017-04-10 DIAGNOSIS — D689 Coagulation defect, unspecified: Secondary | ICD-10-CM | POA: Diagnosis not present

## 2017-04-10 DIAGNOSIS — R269 Unspecified abnormalities of gait and mobility: Secondary | ICD-10-CM | POA: Diagnosis not present

## 2017-04-10 DIAGNOSIS — M25519 Pain in unspecified shoulder: Secondary | ICD-10-CM | POA: Diagnosis not present

## 2017-04-10 DIAGNOSIS — Z7401 Bed confinement status: Secondary | ICD-10-CM | POA: Diagnosis not present

## 2017-04-10 DIAGNOSIS — J0510 Acute epiglottitis without obstruction: Secondary | ICD-10-CM | POA: Diagnosis not present

## 2017-04-10 DIAGNOSIS — F05 Delirium due to known physiological condition: Secondary | ICD-10-CM | POA: Diagnosis not present

## 2017-04-10 DIAGNOSIS — E119 Type 2 diabetes mellitus without complications: Secondary | ICD-10-CM | POA: Diagnosis not present

## 2017-04-10 DIAGNOSIS — R410 Disorientation, unspecified: Secondary | ICD-10-CM | POA: Diagnosis not present

## 2017-04-10 DIAGNOSIS — E785 Hyperlipidemia, unspecified: Secondary | ICD-10-CM | POA: Diagnosis not present

## 2017-04-10 DIAGNOSIS — G8929 Other chronic pain: Secondary | ICD-10-CM | POA: Diagnosis not present

## 2017-04-10 DIAGNOSIS — R4182 Altered mental status, unspecified: Secondary | ICD-10-CM | POA: Diagnosis not present

## 2017-04-10 DIAGNOSIS — M6281 Muscle weakness (generalized): Secondary | ICD-10-CM | POA: Diagnosis not present

## 2017-04-10 DIAGNOSIS — M62838 Other muscle spasm: Secondary | ICD-10-CM | POA: Diagnosis not present

## 2017-04-10 DIAGNOSIS — Z5189 Encounter for other specified aftercare: Secondary | ICD-10-CM | POA: Diagnosis not present

## 2017-04-10 DIAGNOSIS — I4891 Unspecified atrial fibrillation: Secondary | ICD-10-CM | POA: Diagnosis not present

## 2017-04-10 DIAGNOSIS — R52 Pain, unspecified: Secondary | ICD-10-CM | POA: Diagnosis not present

## 2017-04-10 DIAGNOSIS — E1165 Type 2 diabetes mellitus with hyperglycemia: Secondary | ICD-10-CM | POA: Diagnosis not present

## 2017-04-10 DIAGNOSIS — I10 Essential (primary) hypertension: Secondary | ICD-10-CM | POA: Diagnosis not present

## 2017-04-10 DIAGNOSIS — D649 Anemia, unspecified: Secondary | ICD-10-CM | POA: Diagnosis not present

## 2017-04-10 LAB — PROTIME-INR
INR: 1.9
Prothrombin Time: 21.6 seconds — ABNORMAL HIGH (ref 11.4–15.2)

## 2017-04-10 LAB — GLUCOSE, CAPILLARY
Glucose-Capillary: 155 mg/dL — ABNORMAL HIGH (ref 65–99)
Glucose-Capillary: 206 mg/dL — ABNORMAL HIGH (ref 65–99)

## 2017-04-10 MED ORDER — SODIUM CHLORIDE 0.9% FLUSH
3.0000 mL | Freq: Two times a day (BID) | INTRAVENOUS | Status: DC
  Administered 2017-04-10: 3 mL via INTRAVENOUS

## 2017-04-10 NOTE — Plan of Care (Signed)
  Adequate for Discharge Education: Knowledge of General Education information will improve 04/10/2017 1351 - Adequate for Discharge by Feliberto Gottron, [place] Behavior/Discharge Planning: Ability to manage health-related needs will improve 04/10/2017 1351 - Adequate for Discharge by Feliberto Gottron, RN Clinical Measurements: Ability to maintain clinical measurements within normal limits will improve 04/10/2017 1351 - Adequate for Discharge by Chriss Czar, Illene Bolus, RN Will remain free from infection 04/10/2017 1351 - Adequate for Discharge by Feliberto Gottron, RN Diagnostic test results will improve 04/10/2017 1351 - Adequate for Discharge by Feliberto Gottron, RN Respiratory complications will improve 04/10/2017 1351 - Adequate for Discharge by Feliberto Gottron, RN Cardiovascular complication will be avoided 04/10/2017 1351 - Adequate for Discharge by Feliberto Gottron, RN Activity: Risk for activity intolerance will decrease 04/10/2017 1351 - Adequate for Discharge by Feliberto Gottron, RN Nutrition: Adequate nutrition will be maintained 04/10/2017 1351 - Adequate for Discharge by Feliberto Gottron, RN Coping: Level of anxiety will decrease 04/10/2017 1351 - Adequate for Discharge by Feliberto Gottron, RN Elimination: Will not experience complications related to bowel motility 04/10/2017 1351 - Adequate for Discharge by Chriss Czar, Illene Bolus, RN Will not experience complications related to urinary retention 04/10/2017 1351 - Adequate for Discharge by Feliberto Gottron, RN Pain Managment: General experience of comfort will improve 04/10/2017 1351 - Adequate for Discharge by Feliberto Gottron, RN Safety: Ability to remain free from injury will improve 04/10/2017 1351 - Adequate for Discharge by Feliberto Gottron, RN Skin Integrity: Risk for impaired skin integrity will decrease 04/10/2017 1351  - Adequate for Discharge by Feliberto Gottron, RN

## 2017-04-10 NOTE — Clinical Social Work Note (Signed)
CSW contact [employer] to see if the authorization process could be expedited. After much battling with [employer] to get this done, [facility] finally received the auth shortly after my call from [place]. Discharge information sent to [facility]. Nurse to call report. Message left for patient's wife but patient was also informed.  Shela Leff MSW,LCSW [phone]

## 2017-04-10 NOTE — Progress Notes (Signed)
[facility] [place] NAME: Christian Berg    MR#:  [id number]  DATE OF BIRTH:  Feb 20, 1950  SUBJECTIVE:   Patient slept well overnight, no further episodes of agitation or delirium overnight. Patient's wife is at bedside. Patient is more alert and oriented today. Patient's wife wants the patient to sit up in a chair today.  REVIEW OF SYSTEMS:    Review of Systems  Constitutional: Negative for chills and fever.  HENT: Negative for congestion and tinnitus.   Eyes: Negative for blurred vision and double vision.  Respiratory: Negative for cough, shortness of breath and wheezing.   Cardiovascular: Negative for chest pain, orthopnea and PND.  Gastrointestinal: Negative for abdominal pain, diarrhea, nausea and vomiting.  Genitourinary: Negative for dysuria and hematuria.  Neurological: Positive for weakness. Negative for dizziness, sensory change and focal weakness.  All other systems reviewed and are negative.   Nutrition: Heart healthy/Carb modified Tolerating Diet: Yes Tolerating PT: Eval noted.   DRUG ALLERGIES:   Allergies  Allergen Reactions  . Clindamycin/Lincomycin     Chest discomfort  . Sulfa Antibiotics Hives  . Sulfasalazine Hives    VITALS:  Blood pressure (!) 160/66, pulse 82, temperature 98.3 F (36.8 C), temperature source Oral, resp. rate 18, height 5' 8"  (1.727 m), weight 120.3 kg (265 lb 4.8 oz), SpO2 100 %.  PHYSICAL EXAMINATION:   Physical Exam  GENERAL:  67 y.o.-year-old obese patient lying in bed in NAD. EYES: Pupils equal, round, reactive to light. No scleral icterus. Extraocular muscles intact.  HEENT: Head atraumatic, normocephalic. Oropharynx and nasopharynx clear.  NECK:  Supple, no jugular venous distention. No thyroid enlargement, no tenderness.  LUNGS: Normal breath sounds bilaterally, no wheezing, rales, rhonchi. No use of accessory muscles of respiration.  CARDIOVASCULAR: S1, S2 normal. No murmurs,  rubs, or gallops.  ABDOMEN: Soft, nontender, nondistended. Bowel sounds present. No organomegaly or mass.  EXTREMITIES: No cyanosis, clubbing or edema b/l.    NEUROLOGIC: Cranial nerves II through XII are intact. No focal Motor or sensory deficits b/l.  Globally weak. PSYCHIATRIC: The patient is alert and oriented x 2.  SKIN: No obvious rash, lesion, or ulcer.    LABORATORY PANEL:   CBC Recent Labs  Lab 04/06/17 0902  WBC 9.8  HGB 15.1  HCT 43.6  PLT 150   ------------------------------------------------------------------------------------------------------------------  Chemistries  Recent Labs  Lab 04/04/17 0536  04/06/17 0902  NA 141   < > 135  K 4.4   < > 3.5  CL 102   < > 100*  CO2 29   < > 22  GLUCOSE 344*   < > 243*  BUN 45*   < > 44*  CREATININE 1.46*   < > 1.34*  CALCIUM 8.6*   < > 8.8*  MG 2.2  --   --   AST 77*  --   --   ALT 50  --   --   ALKPHOS 58  --   --   BILITOT 1.2  --   --    < > = values in this interval not displayed.   ------------------------------------------------------------------------------------------------------------------  Cardiac Enzymes Recent Labs  Lab 04/03/17 1500  TROPONINI <0.03   ------------------------------------------------------------------------------------------------------------------  RADIOLOGY:  No results found.   ASSESSMENT AND PLAN:   67 year old male with past medical history of hypertension, hyperlipidemia, diabetes, CHF, chronic atrial fibrillation, previous history of TIA was admitted to the hospital due to severe epiglottitis and needed urgent intubation for  airway protection, now has become delirious with altered mental status.  1. Altered mental status-this is metabolic encephalopathy and likely ICU delirium. CT head was negative for acute pathology, seen by neurology and they did not recommend any further intervention other than supportive care. -Patient is off Decadron, Ativan now. Cont. to use  Haldol as needed for agitation. Continue Seroquel at bedtime. Keep awake during the days, sleep at night. Improving.   2. epiglottitis-now resolved. Patient having no acute respiratory issues.  Patient has received 8 days of IV Zosyn and 2 days of Augmentin. Resolved.  - off steroids now.   3.history of chronic atrial fibrillation-rate controlled. Continue amiodarone,metoprolol -Continue Coumadin.  4. Diabetes type 2 without complication-continue Levemir   - cont. sliding scale insulin, metformin, Glipizide. Follow BS which are stable.  5. Hyperlipidemia-continue Pravachol.  Seen by physical therapy and recommended short-term rehabilitation. Social Work Pharmacologist.   All the records are reviewed and case discussed with Care Management/Social Worker. Management plans discussed with the patient, family and they are in agreement.  CODE STATUS: Full   DVT Prophylaxis: Coumadin  TOTAL TIME TAKING CARE OF THIS PATIENT: 30 minutes.   POSSIBLE D/C IN 1-2 DAYS, DEPENDING ON CLINICAL CONDITION.   Henreitta Leber M.D on 04/10/2017 at 1:52 PM  Between 7am to 6pm - Pager - [phone]  After 6pm go to [email or web address] - [facility]  Office  [phone]  CC: Primary care physician; [name], [place]

## 2017-04-10 NOTE — Progress Notes (Signed)
Discharge instructions (including medications) discussed with and copy provided to patient/caregiver. Patient and wife verbalized understanding with no further questions. Report was called and given to [place] at [employer]. Patient transported via EMS in stable conditions.

## 2017-04-10 NOTE — Clinical Social Work Placement (Signed)
   CLINICAL SOCIAL WORK PLACEMENT  NOTE  Date:  04/10/2017  Patient Details  Name: Christian Berg MRN: [id number] Date of Birth: [birth year]  Clinical Social Work is seeking post-discharge placement for this patient at the [place] level of care (*CSW will initial, date and re-position this form in  chart as items are completed):  Yes   Patient/family provided with [facility] Work Department's list of facilities offering this level of care within the geographic area requested by the patient (or if unable, by the patient's family).  Yes   Patient/family informed of their freedom to choose among providers that offer the needed level of care, that participate in Medicare, Medicaid or managed care program needed by the patient, have an available bed and are willing to accept the patient.  Yes   Patient/family informed of [facility]'s ownership interest in [facility] and [facility], as well as of the fact that they are under no obligation to receive care at these facilities.  PASRR submitted to EDS on 04/08/17     PASRR number received on 04/08/17     Existing PASRR number confirmed on       FL2 transmitted to all facilities in geographic area requested by pt/family on 04/08/17     FL2 transmitted to all facilities within larger geographic area on       Patient informed that his/her managed care company has contracts with or will negotiate with certain facilities, including the following:        Yes   Patient/family informed of bed offers received.  Patient chooses bed at [facility]     Physician recommends and patient chooses bed at      Patient to be transferred to [facility] on 04/10/17.  Patient to be transferred to facility by (EMS)     Patient family notified on 04/10/17 of transfer.  Name of family member notified:  (wife: Doris)     PHYSICIAN       Additional Comment:     _______________________________________________ Shela Leff, LCSW 04/10/2017, 1:55 PM

## 2017-04-10 NOTE — Progress Notes (Signed)
[facility] for Warfarin Indication: atrial fibrillation  Allergies  Allergen Reactions  . Clindamycin/Lincomycin     Chest discomfort  . Sulfa Antibiotics Hives  . Sulfasalazine Hives    Patient Measurements: Height: 5' 8"  (172.7 cm) Weight: 265 lb 4.8 oz (120.3 kg) IBW/kg (Calculated) : 68.4   Vital Signs: Temp: 98 F (36.7 C) (03/15 0404) Temp Source: Oral (03/15 0404) BP: 134/72 (03/15 0404) Pulse Rate: 71 (03/15 0404)  Labs: Recent Labs    04/08/17 0600 04/09/17 0634 04/10/17 0533  LABPROT 17.4* 19.6* 21.6*  INR 1.44 1.67 1.90    Estimated Creatinine Clearance: 66.6 mL/min (A) (by C-G formula based on SCr of 1.34 mg/dL (H)).   Medical History: Past Medical History:  Diagnosis Date  . A-fib ([place])   . CHF (congestive heart failure) ([place])   . Degenerative disc disease, lumbar    s/p injury  . Diabetes mellitus without complication ([place])   . Disseminated superficial actinic porokeratosis   . Dysrhythmia    A-FIB, palpatations  . Hypercholesteremia   . Hypertension   . Kidney stones   . Peripheral vascular disease ([place])    Carotid stenosis  . Presence of permanent cardiac pacemaker    Inactive  . TIA (transient ischemic attack) 2011   No deficits    Assessment: 67 y/o M with a h/o atrial fibrillation on warfarin 7.5 mg daily PTA admitted with epiglottitis and intubated. Vitamin K and KCentra ordered with elevated INR for possible trach but not administered.   Patient extubated 3/7 and INR < 2.   DATE INR DOSE 3/7 1.87 3/8 1.67 Unable to take PO 3/9  1.54 Unable to take PO  3/10 1.47 Warfarin 92m 3/11 1.33 Warfarin 466m3/12 1.06     Warfarin 59m100m/13     1.44     Warfarin 59mg43m14     1.67     Warfarin 59mg 76m15     1.90  Goal of Therapy:  INR 2-3   Plan:  Continue Lovenox for DVT prophylaxis. Per RN, patient should be able to take oral medications  Pt has been started on amiodarone. Due to  supratherapeutic INR on admission plus the addition of amiodarone will not give patient's home dose. Will continue with warfarin 59mg d82my. INR almost therapeutic F/u INR in am  MelissRamond DialmD, BCPS Clinical Pharmacist 04/10/2017 7:41 AM

## 2017-04-13 ENCOUNTER — Ambulatory Visit: Payer: Self-pay | Admitting: Family Medicine

## 2017-04-14 DIAGNOSIS — R4182 Altered mental status, unspecified: Secondary | ICD-10-CM | POA: Diagnosis not present

## 2017-04-14 DIAGNOSIS — J0510 Acute epiglottitis without obstruction: Secondary | ICD-10-CM | POA: Diagnosis not present

## 2017-04-14 DIAGNOSIS — I10 Essential (primary) hypertension: Secondary | ICD-10-CM | POA: Diagnosis not present

## 2017-04-14 DIAGNOSIS — L89152 Pressure ulcer of sacral region, stage 2: Secondary | ICD-10-CM | POA: Diagnosis not present

## 2017-04-22 DIAGNOSIS — L89152 Pressure ulcer of sacral region, stage 2: Secondary | ICD-10-CM | POA: Diagnosis not present

## 2017-04-22 DIAGNOSIS — J0510 Acute epiglottitis without obstruction: Secondary | ICD-10-CM | POA: Diagnosis not present

## 2017-04-22 DIAGNOSIS — I4891 Unspecified atrial fibrillation: Secondary | ICD-10-CM | POA: Diagnosis not present

## 2017-04-22 DIAGNOSIS — I10 Essential (primary) hypertension: Secondary | ICD-10-CM | POA: Diagnosis not present

## 2017-04-22 DIAGNOSIS — R4182 Altered mental status, unspecified: Secondary | ICD-10-CM | POA: Diagnosis not present

## 2017-04-22 DIAGNOSIS — G8929 Other chronic pain: Secondary | ICD-10-CM | POA: Diagnosis not present

## 2017-04-22 DIAGNOSIS — E119 Type 2 diabetes mellitus without complications: Secondary | ICD-10-CM | POA: Diagnosis not present

## 2017-04-30 ENCOUNTER — Other Ambulatory Visit: Payer: Self-pay | Admitting: Family Medicine

## 2017-04-30 ENCOUNTER — Ambulatory Visit (INDEPENDENT_AMBULATORY_CARE_PROVIDER_SITE_OTHER): Payer: Medicare HMO | Admitting: Family Medicine

## 2017-04-30 ENCOUNTER — Encounter: Payer: Self-pay | Admitting: Family Medicine

## 2017-04-30 VITALS — BP 140/96 | HR 96 | Temp 98.8°F | Resp 16 | Wt 282.2 lb

## 2017-04-30 DIAGNOSIS — R5383 Other fatigue: Secondary | ICD-10-CM | POA: Diagnosis not present

## 2017-04-30 DIAGNOSIS — R350 Frequency of micturition: Secondary | ICD-10-CM

## 2017-04-30 DIAGNOSIS — N3090 Cystitis, unspecified without hematuria: Secondary | ICD-10-CM | POA: Diagnosis not present

## 2017-04-30 DIAGNOSIS — R040 Epistaxis: Secondary | ICD-10-CM

## 2017-04-30 LAB — POCT URINALYSIS DIPSTICK
Bilirubin, UA: NEGATIVE
Glucose, UA: NEGATIVE
Ketones, UA: NEGATIVE
Nitrite, UA: POSITIVE
Spec Grav, UA: 1.01 (ref 1.010–1.025)
Urobilinogen, UA: 0.2 E.U./dL
pH, UA: 5 (ref 5.0–8.0)

## 2017-04-30 LAB — POCT INR
INR: 2.4
Prothrombin Time: 29.2

## 2017-04-30 MED ORDER — CEPHALEXIN 500 MG PO CAPS: 500.0000 mg | ORAL_CAPSULE | Freq: Two times a day (BID) | ORAL | 0 refills | Status: DC

## 2017-04-30 NOTE — Progress Notes (Signed)
Subjective:     Patient ID: Christian Berg, male   DOB: Jun 22, 1950, 67 y.o.   MRN: [id number] Chief Complaint  Patient presents with  . Fever    Patient comes in office today with concerns of fever and headache that has been for the past 24hrs. Patients wife who is accompanied with him states that today it was 100.9 and patient was given Tylenol. Patient deniesd nausea or dizziness.  [name] Epistaxis    Patient reports for the past 24hrs frequent nose bleeds mainly onthe left side of his nose, paitent states that it feels like water dripping and states that he is able to stop bleeding by pklacing tissue up his nose.  . Oral Pain    Patient reports change to taste when eating, he states that he was treated for thrush but states that it feels like he has a thick layer on his tongue and reports slight pain at top of mouth.    HPI States he also had urinary frequency. Also completed a course of rehab 04/22/17 after critical care hospitalization for epiglottitis. He and wife state pressure ulcer has resolved and he is no longer using Levemir. He was also placed on amiodarone with cardiology f/u later this month. He is still generally weak and fatigues easily. Poor appetite. No significant respiratory sx.  Review of Systems  Endocrine:       Last A1C 04/15/17 @ 8.8       Objective:   Physical Exam  Constitutional: He appears well-developed and well-nourished. No distress (using a s.p. cane to ambulate).  Ears: T.M's intact without inflammation Throat: no tonsillar enlargement or exudate, no buccal plaques Neck: no cervical adenopathy Lungs: clear     Assessment:    1. Urinary frequency - POCT urinalysis dipstick - Urine Culture  2. Epistaxis - POCT INR  3. Fatigue, unspecified type: will obtain if not feeling better after rx of UTI - CBC w/Diff/Platelet - B12 - Folate - Ferritin  4. Cystitis - cephALEXin (KEFLEX) 500 MG capsule; Take 1 capsule (500 mg total) by mouth 2 (two) times  daily.  Dispense: 14 capsule; Refill: 0    Plan:    Discussed use of saline/decongestant spray. Further f/u pending culture/lab results. Refer to ENT if bleeding does not resolve. He will resume checking sugars at home and let me know if persistently high (>200).

## 2017-04-30 NOTE — Patient Instructions (Signed)
We will call you with the urine culture result and lab results if you get them drawn. For your nose start with saline nasal spray 2 x day. After a nose bleed use Afrin or similar once. Call me if bleeding persists.

## 2017-05-05 ENCOUNTER — Ambulatory Visit: Payer: Self-pay | Admitting: Family Medicine

## 2017-05-05 ENCOUNTER — Telehealth: Payer: Self-pay

## 2017-05-05 LAB — URINE CULTURE

## 2017-05-05 NOTE — Telephone Encounter (Signed)
-----   Message from [name], [place] sent at 05/05/2017  7:22 AM EDT ----- You have a urinary tract infection which should improve with the cephalexin that you are taking. Are you feeling better?

## 2017-05-05 NOTE — Telephone Encounter (Signed)
[id number] 

## 2017-05-06 NOTE — Telephone Encounter (Signed)
Patient has been advised and states that he is feeling much better, he has one day left on antibiotic. Amparo [place]

## 2017-05-19 ENCOUNTER — Telehealth: Payer: Self-pay

## 2017-05-19 NOTE — Telephone Encounter (Signed)
Patient called complaining of dizziness and headache for about 3 days and getting worse.  He wanted to come to the office for evaluation.  He continues to get more weak having to use a walker.  Urged patient that due to his medical history he should go to the emergency room to be evaluated.  He said he is not a fan of that idea and he may not go.  Asked to speak to patients wife but he said he would talk to her and she would probably take him to the hospital.  Informed patient that we could see him later this week once he is evaluated by the hospital.  Patient said he would call back to schedule follow up.

## 2017-05-21 ENCOUNTER — Other Ambulatory Visit: Payer: Self-pay | Admitting: Family Medicine

## 2017-05-21 ENCOUNTER — Ambulatory Visit (INDEPENDENT_AMBULATORY_CARE_PROVIDER_SITE_OTHER): Payer: Medicare HMO | Admitting: Family Medicine

## 2017-05-21 ENCOUNTER — Telehealth: Payer: Self-pay | Admitting: Family Medicine

## 2017-05-21 ENCOUNTER — Encounter: Payer: Self-pay | Admitting: Family Medicine

## 2017-05-21 VITALS — BP 150/90 | HR 148 | Temp 98.0°F | Resp 18 | Wt 299.0 lb

## 2017-05-21 DIAGNOSIS — R300 Dysuria: Secondary | ICD-10-CM | POA: Diagnosis not present

## 2017-05-21 DIAGNOSIS — H524 Presbyopia: Secondary | ICD-10-CM | POA: Diagnosis not present

## 2017-05-21 DIAGNOSIS — H401120 Primary open-angle glaucoma, left eye, stage unspecified: Secondary | ICD-10-CM | POA: Diagnosis not present

## 2017-05-21 DIAGNOSIS — E083291 Diabetes mellitus due to underlying condition with mild nonproliferative diabetic retinopathy without macular edema, right eye: Secondary | ICD-10-CM | POA: Diagnosis not present

## 2017-05-21 DIAGNOSIS — R000 Tachycardia, unspecified: Secondary | ICD-10-CM

## 2017-05-21 DIAGNOSIS — G252 Other specified forms of tremor: Secondary | ICD-10-CM | POA: Diagnosis not present

## 2017-05-21 DIAGNOSIS — E113291 Type 2 diabetes mellitus with mild nonproliferative diabetic retinopathy without macular edema, right eye: Secondary | ICD-10-CM | POA: Diagnosis not present

## 2017-05-21 LAB — POCT URINALYSIS DIPSTICK
Glucose, UA: NEGATIVE
Nitrite, UA: POSITIVE
Protein, UA: 30
Spec Grav, UA: 1.01 (ref 1.010–1.025)
Urobilinogen, UA: 0.2 E.U./dL
pH, UA: 5 (ref 5.0–8.0)

## 2017-05-21 LAB — HM DIABETES EYE EXAM

## 2017-05-21 MED ORDER — CEPHALEXIN 500 MG PO CAPS: 500.0000 mg | ORAL_CAPSULE | Freq: Two times a day (BID) | ORAL | 0 refills | Status: DC

## 2017-05-21 NOTE — Patient Instructions (Addendum)
Stop muscle relaxant., cyclobenzaprine. Taper gabapentin over a week to one pill a day then discontinue. We will call you with the cardiology referral and the lab/urine culture results.

## 2017-05-21 NOTE — Telephone Encounter (Signed)
Unable to contact pt for 3;30 appointment today until 4:09 . Pt was advised to go to ER at suggestion of Dr Nehemiah Massed.Pt states that he is unwilling to go to ER.I called [facility] back to see if pt could still come in for office visit and Dr Nehemiah Massed was still willing to see pt.Betsi and myself tried to reach pt.He would not answer phone.Dr Nehemiah Massed is not in office tomorrow but can see pt on Monday.As of right now appointment has not been rescheduled and I will not be in office tomorrow

## 2017-05-21 NOTE — Progress Notes (Signed)
Subjective:     Patient ID: Christian Berg, male   DOB: 05-Dec-1950, 67 y.o.   MRN: [id number] Chief Complaint  Patient presents with  . Hypertension    Patient returns back to office today for follow up, patient was last seen on 04/30/17 blood pressure in house was 140/96.  [name] Fever    Patient reports fever high of 99 for one week, he has concerns of a possible UTI, he denies frequency or urgency of urination, paitent has been taking otc Tylenol for relief  . Dizziness    Patient reports dizziness for the past 3 days that he describes as spinning of room. Patient states that he called office and was triaged and advised to go to ED and patient declined. Patient reports that he took wifes prescription Meclizine and symptom improved. Patient states he would like ti discuss medication options for dizziness PRN. Patient reports that he has had two falls since last visit and had fell straight on his back, patient denies injury.   Tamala Bari    Wife reports that patient has been shaking since being discharged from rehab. Patient states that it has gradually been getting worse and he states that he is unable to hold glass to drink or utensil to eat with without shaking.   [name] Hyperglycemia    Patient reports that fasting bloos sugar readings at home have been high of 147.    HPI On review of his medication remains on gabapentin and muscle relaxants. Reports compliance with metoprolol and amiodarone. Continues to have non-vertiginous dizziness. Accompanied by his wife.  Review of Systems  Cardiovascular: Negative for chest pain.       Objective:   Physical Exam  Constitutional: He appears well-developed and well-nourished. He appears distressed (hand tremors-resting and intention).  Cardiovascular:  No murmur heard. Tachycardic and regular with occasional skipped beat.  Pulmonary/Chest: Breath sounds normal.  Musculoskeletal: He exhibits edema (Right 1+/ Left 0).       Assessment:    1.  Tachycardia - EKG 12-Lead - Renal function panel - CBC with Differential/Platelet - T4, free - TSH - Ambulatory referral to Cardiology  2. Coarse tremors: stop cyclobenzaprine and taper off gabapentin. Evaluate thyroid function on amiodarone.  3. Dysuria - POCT urinalysis dipstick - Urine Culture - cephALEXin (KEFLEX) 500 MG capsule; Take 1 capsule (500 mg total) by mouth 2 (two) times daily.  Dispense: 14 capsule; Refill: 0    Plan:    Further f/u pending lab and culture results. Urgent referral to cardiology.

## 2017-05-21 NOTE — Telephone Encounter (Signed)
Spoke with Mikki Santee and Judson Roch and tried reaching patient and wife but was unsuccessful, left detailed message urging patient to seek immediate attention if shortness of breath and weakness occurs. Judson Roch stated that cariology office could get him in tomorrow but it would not be with Dr. Nehemiah Massed. KW

## 2017-05-22 DIAGNOSIS — R000 Tachycardia, unspecified: Secondary | ICD-10-CM | POA: Diagnosis not present

## 2017-05-22 DIAGNOSIS — R5383 Other fatigue: Secondary | ICD-10-CM | POA: Diagnosis not present

## 2017-05-22 DIAGNOSIS — Z0100 Encounter for examination of eyes and vision without abnormal findings: Secondary | ICD-10-CM | POA: Diagnosis not present

## 2017-05-22 DIAGNOSIS — D539 Nutritional anemia, unspecified: Secondary | ICD-10-CM | POA: Diagnosis not present

## 2017-05-23 LAB — RENAL FUNCTION PANEL
Albumin: 3.1 g/dL — ABNORMAL LOW (ref 3.6–4.8)
BUN/Creatinine Ratio: 7 — ABNORMAL LOW (ref 10–24)
BUN: 11 mg/dL (ref 8–27)
CO2: 19 mmol/L — ABNORMAL LOW (ref 20–29)
Calcium: 9.9 mg/dL (ref 8.6–10.2)
Chloride: 103 mmol/L (ref 96–106)
Creatinine, Ser: 1.54 mg/dL — ABNORMAL HIGH (ref 0.76–1.27)
GFR calc Af Amer: 53 mL/min/{1.73_m2} — ABNORMAL LOW (ref >59)
GFR calc non Af Amer: 46 mL/min/{1.73_m2} — ABNORMAL LOW (ref >59)
Glucose: 149 mg/dL — ABNORMAL HIGH (ref 65–99)
Phosphorus: 2.7 mg/dL (ref 2.5–4.5)
Potassium: 4.5 mmol/L (ref 3.5–5.2)
Sodium: 140 mmol/L (ref 134–144)

## 2017-05-23 LAB — CBC WITH DIFFERENTIAL/PLATELET
Basophils Absolute: 0 10*3/uL (ref 0.0–0.2)
Basos: 1 %
EOS (ABSOLUTE): 0.2 10*3/uL (ref 0.0–0.4)
Eos: 4 %
Hematocrit: 32.4 % — ABNORMAL LOW (ref 37.5–51.0)
Hemoglobin: 10.5 g/dL — ABNORMAL LOW (ref 13.0–17.7)
Immature Grans (Abs): 0 10*3/uL (ref 0.0–0.1)
Immature Granulocytes: 0 %
Lymphocytes Absolute: 1.4 10*3/uL (ref 0.7–3.1)
Lymphs: 26 %
MCH: 33.4 pg — ABNORMAL HIGH (ref 26.6–33.0)
MCHC: 32.4 g/dL (ref 31.5–35.7)
MCV: 103 fL — ABNORMAL HIGH (ref 79–97)
Monocytes Absolute: 0.6 10*3/uL (ref 0.1–0.9)
Monocytes: 12 %
Neutrophils Absolute: 3.1 10*3/uL (ref 1.4–7.0)
Neutrophils: 57 %
Platelets: 287 10*3/uL (ref 150–379)
RBC: 3.14 x10E6/uL — ABNORMAL LOW (ref 4.14–5.80)
RDW: 15.2 % (ref 12.3–15.4)
WBC: 5.4 10*3/uL (ref 3.4–10.8)

## 2017-05-23 LAB — URINE CULTURE

## 2017-05-23 LAB — T4, FREE: Free T4: 1.2 ng/dL (ref 0.82–1.77)

## 2017-05-23 LAB — TSH: TSH: 2.05 u[IU]/mL (ref 0.450–4.500)

## 2017-05-25 ENCOUNTER — Telehealth: Payer: Self-pay

## 2017-05-25 NOTE — Telephone Encounter (Signed)
Pt advised.  I called [facility] and had the test added.   Thanks,   -Mickel Baas

## 2017-05-25 NOTE — Telephone Encounter (Signed)
-----   Message from [name], [place] sent at 05/23/2017  8:59 AM EDT ----- Thyroid function is ok and kidney function is ok. He has a moderate anemia suggestive of B12 deficiency. Please try to add that lab if possible to his current blood draw.

## 2017-05-25 NOTE — Telephone Encounter (Signed)
-----   Message from [name], [place] sent at 05/25/2017  7:36 AM EDT ----- E. Coli urinary tract infection sensitive to the antibiotic you are on. Your labs reflect a mild to moderate anemia. We will add other labs to check on this. Please see if we can add B12, Folate, and Ferritin to lab draw from 4/26.

## 2017-05-27 ENCOUNTER — Encounter: Payer: Self-pay | Admitting: Family Medicine

## 2017-05-27 ENCOUNTER — Other Ambulatory Visit: Payer: Self-pay

## 2017-05-27 ENCOUNTER — Ambulatory Visit (INDEPENDENT_AMBULATORY_CARE_PROVIDER_SITE_OTHER): Payer: Medicare HMO | Admitting: Family Medicine

## 2017-05-27 VITALS — BP 122/82 | HR 98 | Wt 303.0 lb

## 2017-05-27 DIAGNOSIS — L565 Disseminated superficial actinic porokeratosis (DSAP): Secondary | ICD-10-CM

## 2017-05-27 LAB — B12 AND FOLATE PANEL
Folate: 18.4 ng/mL (ref >3.0)
Vitamin B-12: 688 pg/mL (ref 232–1245)

## 2017-05-27 LAB — SPECIMEN STATUS REPORT

## 2017-05-27 LAB — FERRITIN: Ferritin: 175 ng/mL (ref 30–400)

## 2017-05-27 MED ORDER — SODIUM CHLORIDE 0.9 % EX SOLN
2.0000 | Freq: Two times a day (BID) | CUTANEOUS | 1 refills | Status: DC
Start: 2017-05-27 — End: 2017-08-19

## 2017-05-27 NOTE — Patient Instructions (Signed)
Cleanse wounds with salt water solution twice daily and apply dressing/bandage.

## 2017-05-27 NOTE — Progress Notes (Signed)
  Subjective:     Patient ID: Christian Berg, male   DOB: [birth year], 67 y.o.   MRN: [id number] Chief Complaint  Patient presents with  . Foot Ulcer    bilateral legs, patient fell and his legs are not healing   HPI He is feeling better with near completion of cephalexin for a UTI. Upper extremity tremors have improved and isno longer tachycardic. States he has irritated/injured  his DSAP lesions and they are draining clear fluid. Accompanied by his wife today.  Review of Systems     Objective:   Physical Exam  Constitutional: He appears well-developed and well-nourished. No distress.  Skin:  Bilateral anterior lower legs with open wounds at DSP lesions. Cleansed with normal saline with dressing/bandage.       Assessment:    1. DSAP (disseminated superficial actinic porokeratosis): Will treat as wound with saline cleansings and dressing.    Plan:    F/u in one week to recheck legs, check U/A for resolution, and recheck macrocytic anemia.

## 2017-05-29 ENCOUNTER — Telehealth: Payer: Self-pay

## 2017-05-29 NOTE — Telephone Encounter (Signed)
-----   Message from [name], [place] sent at 05/26/2017  7:53 AM EDT ----- B12 and iron stores are ok. Any sign of bleeding-dark, tarry stools or rectal bleeding?

## 2017-05-29 NOTE — Telephone Encounter (Signed)
[id number] 

## 2017-06-01 ENCOUNTER — Ambulatory Visit: Payer: Medicare HMO | Admitting: Family Medicine

## 2017-06-02 NOTE — Telephone Encounter (Signed)
Patient advised he denies any signs of bleeding or dark/tarry stools. KW

## 2017-06-03 ENCOUNTER — Other Ambulatory Visit: Payer: Self-pay | Admitting: Family Medicine

## 2017-06-03 ENCOUNTER — Encounter: Payer: Self-pay | Admitting: Family Medicine

## 2017-06-03 ENCOUNTER — Ambulatory Visit (INDEPENDENT_AMBULATORY_CARE_PROVIDER_SITE_OTHER): Payer: Medicare HMO | Admitting: Family Medicine

## 2017-06-03 VITALS — BP 150/100 | HR 91 | Temp 98.7°F | Resp 16 | Wt 302.0 lb

## 2017-06-03 DIAGNOSIS — Z87440 Personal history of urinary (tract) infections: Secondary | ICD-10-CM | POA: Diagnosis not present

## 2017-06-03 DIAGNOSIS — L565 Disseminated superficial actinic porokeratosis (DSAP): Secondary | ICD-10-CM

## 2017-06-03 DIAGNOSIS — R600 Localized edema: Secondary | ICD-10-CM | POA: Diagnosis not present

## 2017-06-03 DIAGNOSIS — N390 Urinary tract infection, site not specified: Secondary | ICD-10-CM | POA: Diagnosis not present

## 2017-06-03 DIAGNOSIS — D539 Nutritional anemia, unspecified: Secondary | ICD-10-CM

## 2017-06-03 LAB — POCT URINALYSIS DIPSTICK
Glucose, UA: NEGATIVE
Ketones, UA: NEGATIVE
Nitrite, UA: POSITIVE
Protein, UA: 30
Spec Grav, UA: 1.02 (ref 1.010–1.025)
Urobilinogen, UA: 1 E.U./dL
pH, UA: 6 (ref 5.0–8.0)

## 2017-06-03 MED ORDER — FUROSEMIDE 20 MG PO TABS: 20.0000 mg | ORAL_TABLET | Freq: Every day | ORAL | 0 refills | Status: DC

## 2017-06-03 NOTE — Patient Instructions (Signed)
We will call you with the lab results. Continue saline cleansings and dressings to your legs.Return in one week if not continuing to improve.

## 2017-06-03 NOTE — Progress Notes (Signed)
  Subjective:     Patient ID: Christian Berg, male   DOB: 06/04/1950, 67 y.o.   MRN: [id number] Chief Complaint  Patient presents with  . Dysuria    Patient returns for follow up from 05/21/17, he completed Keflex that was prescribed and states denies today symptoms such as frequnecy, dysuria, fever, nausea or back pain.   . Disseminated Superficial Actinic Porokeratosis    Patient returns for one week follow up, he states that wounds on lefs have scabbed over and states that he has noticed swelling in the left foot.  . Wheezing    Patient would like to address wheezing and gurgling in the PM that has been occuring for the past 7 days.  . Anemia    Patient returns back to office for follow up from labs drawn 05/22/17   HPI Wife states she has continued cleansings with saline but quit dressing a day or two ago as it looked irritated to her. No longer with serous drainage but has noticed pus at times. Also will recheck macrocytic anemia with normal B 12, Folate, and Ferritin.  Review of Systems     Objective:   Physical Exam  Constitutional: He appears well-developed and well-nourished. No distress.  Pulmonary/Chest: Breath sounds normal. He has no wheezes.  Upper airway audible wheezing c/w pooling of secretions.  Skin:  Left lower extremity appear near resolved. Left lower extremity with 3 areas of eschar. Small amount of pus expressed from one eschar but when debrided has underlying pink tissue and no induration. 1+ pedal edema       Assessment:    1. DSAP (disseminated superficial actinic porokeratosis): continue saline cleansing/dressing/bandage.  2. Pedal edema: furosemide 20 mg. Daily #14 pending cardiology referral.  3. Macrocytic anemia - CBC with Differential/Platelet - Retic  4. History of UTI - POCT urinalysis dipstick - Urine Culture    Plan:    Further f/u pending lab results.

## 2017-06-04 ENCOUNTER — Telehealth: Payer: Self-pay

## 2017-06-04 LAB — CBC WITH DIFFERENTIAL/PLATELET
Basophils Absolute: 0 10*3/uL (ref 0.0–0.2)
Basos: 0 %
EOS (ABSOLUTE): 0.2 10*3/uL (ref 0.0–0.4)
Eos: 3 %
Hematocrit: 34.3 % — ABNORMAL LOW (ref 37.5–51.0)
Hemoglobin: 11.1 g/dL — ABNORMAL LOW (ref 13.0–17.7)
Immature Grans (Abs): 0 10*3/uL (ref 0.0–0.1)
Immature Granulocytes: 0 %
Lymphocytes Absolute: 1.5 10*3/uL (ref 0.7–3.1)
Lymphs: 26 %
MCH: 33 pg (ref 26.6–33.0)
MCHC: 32.4 g/dL (ref 31.5–35.7)
MCV: 102 fL — ABNORMAL HIGH (ref 79–97)
Monocytes Absolute: 0.6 10*3/uL (ref 0.1–0.9)
Monocytes: 11 %
Neutrophils Absolute: 3.5 10*3/uL (ref 1.4–7.0)
Neutrophils: 60 %
Platelets: 179 10*3/uL (ref 150–379)
RBC: 3.36 x10E6/uL — ABNORMAL LOW (ref 4.14–5.80)
RDW: 14.7 % (ref 12.3–15.4)
WBC: 5.8 10*3/uL (ref 3.4–10.8)

## 2017-06-04 LAB — RETICULOCYTES: Retic Ct Pct: 3.4 % — ABNORMAL HIGH (ref 0.6–2.6)

## 2017-06-04 NOTE — Telephone Encounter (Signed)
-----   Message from [name], [place] sent at 06/04/2017  7:33 AM EDT ----- Anemia improving. We will call when the urine culture is available.

## 2017-06-04 NOTE — Telephone Encounter (Signed)
[id number] 

## 2017-06-05 ENCOUNTER — Telehealth: Payer: Self-pay

## 2017-06-05 ENCOUNTER — Other Ambulatory Visit: Payer: Self-pay | Admitting: Family Medicine

## 2017-06-05 LAB — URINE CULTURE

## 2017-06-05 MED ORDER — DOXYCYCLINE HYCLATE 100 MG PO TABS
100.0000 mg | ORAL_TABLET | Freq: Two times a day (BID) | ORAL | 0 refills | Status: DC
Start: 2017-06-05 — End: 2017-06-26

## 2017-06-05 NOTE — Telephone Encounter (Signed)
Patient has been advised. KW 

## 2017-06-05 NOTE — Telephone Encounter (Signed)
-----   Message from [name], [place] sent at 06/05/2017 12:39 PM EDT ----- Preliminary urine culture report showing E. Coli infection again. Will start doxycycline pending final results.

## 2017-06-08 ENCOUNTER — Telehealth: Payer: Self-pay

## 2017-06-08 NOTE — Telephone Encounter (Signed)
-----   Message from [name], [place] sent at 06/08/2017  7:30 AM EDT ----- Urine infection is sensitive to antibiotic he is on ( doxycycline). Consider checking protime this week as antibiotic may affect his blood thinning times.

## 2017-06-08 NOTE — Telephone Encounter (Signed)
[id number] 

## 2017-06-11 ENCOUNTER — Other Ambulatory Visit: Payer: Self-pay | Admitting: Family Medicine

## 2017-06-11 DIAGNOSIS — E1169 Type 2 diabetes mellitus with other specified complication: Secondary | ICD-10-CM

## 2017-06-11 DIAGNOSIS — E785 Hyperlipidemia, unspecified: Principal | ICD-10-CM

## 2017-06-11 NOTE — Telephone Encounter (Signed)
Patient advised and was placed on schedule for 06/12/17

## 2017-06-12 ENCOUNTER — Ambulatory Visit (INDEPENDENT_AMBULATORY_CARE_PROVIDER_SITE_OTHER): Payer: Medicare HMO

## 2017-06-12 DIAGNOSIS — I482 Chronic atrial fibrillation, unspecified: Secondary | ICD-10-CM

## 2017-06-12 LAB — POCT INR
INR: 2.1
Prothrombin Time: 24.7

## 2017-06-12 NOTE — Patient Instructions (Signed)
Description   Take 7.28m daily. Recheck in 2 weeks

## 2017-06-15 DIAGNOSIS — I5042 Chronic combined systolic (congestive) and diastolic (congestive) heart failure: Secondary | ICD-10-CM | POA: Diagnosis not present

## 2017-06-15 DIAGNOSIS — I482 Chronic atrial fibrillation: Secondary | ICD-10-CM | POA: Diagnosis not present

## 2017-06-15 DIAGNOSIS — I6523 Occlusion and stenosis of bilateral carotid arteries: Secondary | ICD-10-CM | POA: Diagnosis not present

## 2017-06-15 DIAGNOSIS — Z6841 Body Mass Index (BMI) 40.0 and over, adult: Secondary | ICD-10-CM | POA: Diagnosis not present

## 2017-06-26 ENCOUNTER — Other Ambulatory Visit: Payer: Self-pay | Admitting: Family Medicine

## 2017-06-26 ENCOUNTER — Ambulatory Visit (INDEPENDENT_AMBULATORY_CARE_PROVIDER_SITE_OTHER): Payer: Medicare HMO | Admitting: Emergency Medicine

## 2017-06-26 DIAGNOSIS — N3090 Cystitis, unspecified without hematuria: Secondary | ICD-10-CM

## 2017-06-26 DIAGNOSIS — I482 Chronic atrial fibrillation, unspecified: Secondary | ICD-10-CM

## 2017-06-26 DIAGNOSIS — R350 Frequency of micturition: Secondary | ICD-10-CM

## 2017-06-26 LAB — POCT URINALYSIS DIPSTICK
Bilirubin, UA: NEGATIVE
Glucose, UA: POSITIVE — AB
Ketones, UA: NEGATIVE
Nitrite, UA: NEGATIVE
Protein, UA: NEGATIVE
Spec Grav, UA: 1.01 (ref 1.010–1.025)
Urobilinogen, UA: 0.2 E.U./dL
pH, UA: 6 (ref 5.0–8.0)

## 2017-06-26 LAB — POCT INR
INR: 1.3 — AB (ref 2.0–3.0)
PT: 15.3

## 2017-06-26 MED ORDER — CEPHALEXIN 500 MG PO CAPS: 500.0000 mg | ORAL_CAPSULE | Freq: Two times a day (BID) | ORAL | 0 refills | Status: DC

## 2017-06-26 NOTE — Patient Instructions (Signed)
Double your dose today then resume your normal 7.5 daily.

## 2017-06-29 ENCOUNTER — Telehealth: Payer: Self-pay

## 2017-06-29 LAB — URINE CULTURE

## 2017-06-29 NOTE — Telephone Encounter (Signed)
-----   Message from [name], [place] sent at 06/29/2017  2:00 PM EDT ----- Your bladder infection should be sensitive to the cephalexin. Can you come in on Friday, 6/7. We can check your urine again and see how your sugar is doing. Poorly controlled diabetes can contribute to these infections.

## 2017-06-29 NOTE — Telephone Encounter (Signed)
[id number] 

## 2017-07-01 ENCOUNTER — Other Ambulatory Visit: Payer: Self-pay | Admitting: Family Medicine

## 2017-07-10 ENCOUNTER — Ambulatory Visit (INDEPENDENT_AMBULATORY_CARE_PROVIDER_SITE_OTHER): Payer: Medicare HMO

## 2017-07-10 DIAGNOSIS — I482 Chronic atrial fibrillation, unspecified: Secondary | ICD-10-CM

## 2017-07-10 LAB — POCT INR
INR: 2.9 (ref 2.0–3.0)
PT: 35.1

## 2017-07-10 NOTE — Patient Instructions (Signed)
Description   Continue 7.73m daily. Recheck in 4 weeks.

## 2017-07-16 ENCOUNTER — Ambulatory Visit (INDEPENDENT_AMBULATORY_CARE_PROVIDER_SITE_OTHER): Payer: Medicare HMO | Admitting: Family Medicine

## 2017-07-16 ENCOUNTER — Other Ambulatory Visit: Payer: Self-pay | Admitting: Family Medicine

## 2017-07-16 ENCOUNTER — Encounter: Payer: Self-pay | Admitting: Family Medicine

## 2017-07-16 VITALS — BP 138/102 | HR 76 | Temp 98.7°F | Resp 16 | Wt 279.2 lb

## 2017-07-16 DIAGNOSIS — Z87440 Personal history of urinary (tract) infections: Secondary | ICD-10-CM

## 2017-07-16 DIAGNOSIS — E119 Type 2 diabetes mellitus without complications: Secondary | ICD-10-CM

## 2017-07-16 DIAGNOSIS — R350 Frequency of micturition: Secondary | ICD-10-CM | POA: Diagnosis not present

## 2017-07-16 LAB — POCT GLYCOSYLATED HEMOGLOBIN (HGB A1C): Hemoglobin A1C: 6.6 % — AB (ref 4.0–5.6)

## 2017-07-16 LAB — POCT URINALYSIS DIPSTICK
Bilirubin, UA: NEGATIVE
Glucose, UA: POSITIVE — AB
Ketones, UA: NEGATIVE
Nitrite, UA: POSITIVE
Protein, UA: NEGATIVE
Spec Grav, UA: 1.015 (ref 1.010–1.025)
Urobilinogen, UA: 0.2 E.U./dL
pH, UA: 5 (ref 5.0–8.0)

## 2017-07-16 NOTE — Progress Notes (Signed)
  Subjective:     Patient ID: Christian Berg, male   DOB: [birth year], 67 y.o.   MRN: [id number] Chief Complaint  Patient presents with  . Diabetes    Patient returns to office today for follow up, last HgbA1C was 04/01/17 8.6%. Patient reports good compliance and tolerance on medication, he reports increased urination .    HPI States he did not feel his urinary sx went away after last course of abx. Hx of prior urology evaluation and left non-obstructing renal stone. Accompanied by his wife today.  Review of Systems  Constitutional: Positive for unexpected weight change ( approximately 20# weight loss since 06/13/17. No change in lifestyle but patient was on a 2 week course of diuretics.).  Neurological:       Reports nocturnal foot paresthesias relieved by tramadol.       Objective:   Physical Exam  Constitutional: He appears well-developed and well-nourished. No distress.  Lungs: clear Heart: RRR without murmur Lower extremities: no edema; pedal pulses intact, sensation to monofilament intact, no wounds noted.     Assessment:    1. History of UTI - POCT urinalysis dipstick - Ambulatory referral to Urology - Urine Culture  2. Type 2 diabetes mellitus without complication, without long-term current use of insulin (HCC) - POCT glycosylated hemoglobin (Hb A1C)    Plan:    Further f/u pending urine culture.

## 2017-07-16 NOTE — Patient Instructions (Addendum)
We will call you about the referral to urology.

## 2017-07-20 ENCOUNTER — Other Ambulatory Visit: Payer: Self-pay | Admitting: Family Medicine

## 2017-07-20 LAB — URINE CULTURE

## 2017-07-20 MED ORDER — DOXYCYCLINE HYCLATE 100 MG PO TABS: 100.0000 mg | ORAL_TABLET | Freq: Two times a day (BID) | ORAL | 0 refills | Status: DC

## 2017-07-21 ENCOUNTER — Telehealth: Payer: Self-pay

## 2017-07-21 NOTE — Telephone Encounter (Signed)
-----   Message from [name], [place] sent at 07/20/2017 11:48 AM EDT ----- Same organism but will try different antibiotic. Also proceed with the urology evaluation.

## 2017-07-21 NOTE — Telephone Encounter (Signed)
[id number] 

## 2017-07-23 NOTE — Telephone Encounter (Signed)
Patient was notified of results. Expressed understanding.

## 2017-08-07 ENCOUNTER — Ambulatory Visit (INDEPENDENT_AMBULATORY_CARE_PROVIDER_SITE_OTHER): Payer: Medicare HMO

## 2017-08-07 DIAGNOSIS — I482 Chronic atrial fibrillation, unspecified: Secondary | ICD-10-CM

## 2017-08-07 LAB — POCT INR
INR: 2.9 (ref 2.0–3.0)
Prothrombin Time: 34.6

## 2017-08-07 NOTE — Patient Instructions (Signed)
Description   Continue 7.72m daily. Recheck in 4 weeks.

## 2017-08-18 ENCOUNTER — Other Ambulatory Visit: Payer: Self-pay | Admitting: Family Medicine

## 2017-08-18 MED ORDER — METFORMIN HCL 850 MG PO TABS: 850.0000 mg | ORAL_TABLET | Freq: Two times a day (BID) | ORAL | 1 refills | Status: DC

## 2017-08-18 NOTE — Progress Notes (Signed)
08/19/2017 10:18 AM   Jacqlyn Krauss [birth year] [id number]  Referring provider: [name], [address] [place], [place] [address]  Chief Complaint  Patient presents with  . Recurrent UTI    HPI: Patient is a 67 year old Caucasian male with a history of hematuria and recurrent UTI's who is referred back to [place] by [name], PA with wife, [name].    History of hematuria CTU, cystoscopy with RTG's completed in 07/2014 were negative for malignancies.  Findings positive for a 2 mm stone in his left kidney.  He does not report any gross hematuria.  His UA today is nitrite positive, 11-30 WBC's and many bacteria.    History of nephrolithiasis 2 mm left renal stone seen on CTU in 07/2014.  No complaint of flank pain.  No passage of fragments.    History of recurrent UTI's + E.coli resistant to ampicillin, ciprofloxacin, levofloxacin and tobramycin on 07/16/2017 + E.coli resistant to ampicillin, ciprofloxacin, levofloxacin and tobramycin and proteus mirabilis resistant to nitrofurantoin on 06/26/2017 + E.coli resistant to Augmentin, ampicillin, Cipro, Levaquin and tobramycin on 06/03/2017 + E.coli resistant to Augmentin, ampicillin, Cipro, Levaquin and tobramycin on 05/21/2017 + E.coli resistant to ampicillin, Cipro, Levaquin and tobramycin on 04/30/2017 + E.coli resistant to ampicillin, Cipro, Levaquin and tobramycin on 02/23/2017  Risk factors for UTI's: age, DM,   He states that when he has an UTI, he goes more frequently.  He states his urine is positive for infections when he is asymptomatic.    Today, he is having frequency x 3-4  and nocturia x 3.  He does not have incontinence.  Patient denies any gross hematuria, dysuria or suprapubic/flank pain.  Patient denies any fevers, chills, nausea or vomiting.   His PVR is 14 mL.     He is having episodes of diarrhea once weekly.  He is drinking 4 glasses of water daily.  He has been drinking [employer], [employer]  and [employer].  He drinks an occasional sweet tea.  He does not drink coffee.  He does not drink alcohol.       PMH: Past Medical History:  Diagnosis Date  . A-fib ([place])   . CHF (congestive heart failure) ([place])   . Degenerative disc disease, lumbar    s/p injury  . Diabetes mellitus without complication ([place])   . Disseminated superficial actinic porokeratosis   . Dysrhythmia    A-FIB, palpatations  . Hypercholesteremia   . Hypertension   . Kidney stones   . Peripheral vascular disease ([place])    Carotid stenosis  . Presence of permanent cardiac pacemaker    Inactive  . TIA (transient ischemic attack) 2011   No deficits    Surgical History: Past Surgical History:  Procedure Laterality Date  . APPENDECTOMY  2004  . CARDIAC CATHETERIZATION    . CARDIAC PACEMAKER PLACEMENT  2005  . CATARACT EXTRACTION W/PHACO Right 06/14/2014   Procedure: CATARACT EXTRACTION PHACO AND INTRAOCULAR LENS PLACEMENT (IOC);  Surgeon: Leandrew Koyanagi, MD;  Location: [facility];  Service: Ophthalmology;  Laterality: Right;  . CYSTOSCOPY W/ RETROGRADES Bilateral 08/09/2014   Procedure: CYSTOSCOPY WITH RETROGRADE PYELOGRAM;  Surgeon: Hollice Espy, MD;  Location: [facility];  Service: Urology;  Laterality: Bilateral;  . INTUBATION-ENDOTRACHEAL WITH TRACHEOSTOMY STANDBY  03/30/2017   Procedure: INTUBATION-ENDOTRACHEAL WITH TRACHEOSTOMY STANDBY;  Surgeon: Carloyn Manner, MD;  Location: [facility];  Service: ENT;;    Home Medications:  Allergies as of 08/19/2017  Reactions   Clindamycin/lincomycin    Chest discomfort   Sulfa Antibiotics Hives   Sulfasalazine Hives      Medication List        Accurate as of 08/19/17 10:18 AM. Always use your most recent med list.          aspirin EC 81 MG tablet Take 81 mg by mouth every evening.   glipiZIDE 10 MG tablet Commonly known as:  GLUCOTROL Take 1 tablet (10 mg total) by mouth 2 (two) times daily before a meal. Take 30 minutes before a  meal   lansoprazole 15 MG capsule Commonly known as:  PREVACID Take 15-30 mg by mouth daily as needed. For heartburn/reflux   lisinopril 20 MG tablet Commonly known as:  PRINIVIL,ZESTRIL TAKE ONE TABLET BY MOUTH ONCE DAILY   lovastatin 10 MG tablet Commonly known as:  MEVACOR Take 2 tablets (20 mg total) by mouth daily.   metFORMIN 850 MG tablet Commonly known as:  GLUCOPHAGE Take 1 tablet (850 mg total) by mouth 2 (two) times daily with a meal.   metoprolol tartrate 100 MG tablet Commonly known as:  LOPRESSOR Take 1 tablet (100 mg total) by mouth at bedtime.   nystatin cream Commonly known as:  MYCOSTATIN Apply 1 application topically 2 (two) times daily.   traMADol-acetaminophen 37.5-325 MG tablet Commonly known as:  ULTRACET Take 2 tablets by mouth 4 (four) times daily.   warfarin 5 MG tablet Commonly known as:  COUMADIN Take as directed by the anticoagulation clinic. If you are unsure how to take this medication, talk to your nurse or doctor. Original instructions:  Take 1 tablet (5 mg total) by mouth daily.       Allergies:  Allergies  Allergen Reactions  . Clindamycin/Lincomycin     Chest discomfort  . Sulfa Antibiotics Hives  . Sulfasalazine Hives    Family History: Family History  Adopted: Yes  Problem Relation Age of Onset  . Heart disease Mother   . Fibromyalgia Sister   . Hypertension Daughter   . Migraines Daughter   . Kidney disease Neg Hx   . Prostate cancer Neg Hx   . Bladder Cancer Neg Hx     Social History:  reports that he has never smoked. He has never used smokeless tobacco. He reports that he does not drink alcohol or use drugs.  ROS: UROLOGY Frequent Urination?: Yes Hard to postpone urination?: No Burning/pain with urination?: Yes Get up at night to urinate?: Yes Leakage of urine?: No Urine stream starts and stops?: No Trouble starting stream?: No Do you have to strain to urinate?: No Blood in urine?: No Urinary tract  infection?: Yes Sexually transmitted disease?: No Injury to kidneys or bladder?: No Painful intercourse?: No Weak stream?: No Erection problems?: No Penile pain?: No  Gastrointestinal Nausea?: No Vomiting?: No Indigestion/heartburn?: No Diarrhea?: No Constipation?: No  Constitutional Fever: No Night sweats?: No Weight loss?: No Fatigue?: No  Skin Skin rash/lesions?: No Itching?: No  Eyes Blurred vision?: No Double vision?: No  Ears/Nose/Throat Sore throat?: No Sinus problems?: No  Hematologic/Lymphatic Swollen glands?: No Easy bruising?: Yes  Cardiovascular Leg swelling?: No Chest pain?: No  Respiratory Cough?: No Shortness of breath?: Yes  Endocrine Excessive thirst?: No  Musculoskeletal Back pain?: No Joint pain?: Yes  Neurological Headaches?: No Dizziness?: No  Psychologic Depression?: No Anxiety?: No  Physical Exam: BP (!) 142/86 (BP Location: Right Arm, Patient Position: Sitting, Cuff Size: Large)   Pulse (!) 108   Ht  5' 8" (1.727 m)   Wt 283 lb 4.8 oz (128.5 kg)   BMI 43.08 kg/m   Constitutional:  Well nourished. Alert and oriented, No acute distress. HEENT: [place] AT, moist mucus membranes.  Trachea midline, no masses. Cardiovascular: No clubbing, cyanosis, or edema. Respiratory: Normal respiratory effort, no increased work of breathing. GI: Abdomen is soft, non tender, non distended, no abdominal masses. Liver and spleen not palpable.  No hernias appreciated.  Stool sample for occult testing is not indicated.   GU: No CVA tenderness.  No bladder fullness or masses.  Patient with uncircumcised phallus.  Foreskin easily retracted Urethral meatus is patent.  No penile discharge. No penile lesions or rashes. Scrotum without lesions, cysts, rashes and/or edema.  Testicles are located scrotally bilaterally. No masses are appreciated in the testicles. Left and right epididymis are normal. Rectal: Patient with  normal sphincter tone. Anus and  perineum without scarring or rashes. No rectal masses are appreciated. Prostate is approximately 50 grams, no nodules are appreciated. Seminal vesicles are normal. Skin: No rashes, bruises or suspicious lesions. Lymph: No cervical or inguinal adenopathy. Neurologic: Grossly intact, no focal deficits, moving all 4 extremities. Psychiatric: Normal mood and affect.  Laboratory Data: Lab Results  Component Value Date   WBC 5.8 06/03/2017   HGB 11.1 (L) 06/03/2017   HCT 34.3 (L) 06/03/2017   MCV 102 (H) 06/03/2017   PLT 179 06/03/2017    Lab Results  Component Value Date   CREATININE 1.54 (H) 05/22/2017    No results found for: PSA  No results found for: TESTOSTERONE  Lab Results  Component Value Date   HGBA1C 6.6 (A) 07/16/2017    Lab Results  Component Value Date   TSH 2.050 05/22/2017       Component Value Date/Time   CHOL 105 05/24/2015 1058   HDL 36 (L) 05/24/2015 1058   CHOLHDL 2.9 05/24/2015 1058   LDLCALC 42 05/24/2015 1058    Lab Results  Component Value Date   AST 77 (H) 04/04/2017   Lab Results  Component Value Date   ALT 50 04/04/2017   No components found for: ALKALINEPHOPHATASE No components found for: BILIRUBINTOTAL  No results found for: ESTRADIOL  Urinalysis See HPI and Epic.  I have reviewed the labs.   Pertinent Imaging: Results for Schmeling, Bassel R "RAY" (MRN [id number]) as of 08/19/2017 09:58  Ref. Range 08/19/2017 09:15  Scan Result Unknown 14     Assessment & Plan:    1. Recurrent UTI's Criteria for recurrent UTI has been met with 2 or more infections in 6 months or 3 or greater infections in one year  Patient is instructed to increase their water intake until the urine is pale yellow or clear (10 to 12 cups daily) and strongly advised to cut out all sodas and surgery drinks  Patient is instructed to take probiotics and take cranberry pills  Explained to the patient colonization of urine vs infection  - reviewed symptoms of UTI  (fevers, chills, gross hematuria, mental status changes, dysuria, suprapubic pain, back pain and/or sudden worsening of urinary symptoms) and advised not to have urine checked or be treated for UTI if not experiencing symptoms advised them to have CATH UA's for urinalysis and culture to prevent skin and/or rectal contamination of the specimen discussed antibiotic stewardship with the patient - explained the risk of increasing risk of antibiotic resistance with continuous exposure to antibiotics, renal failure, hypoglycemia, C. Diff infection, allergic reactions, etc.   Will   not send this UA for culture - patient will return tomorrow for CATH UA and urine culture   2. BPH with LUTS Continue conservative management, avoiding bladder irritants and timed voiding's Most bothersome symptoms is/are nocturia x 3 PSA is drawn today  3. Nocturia Will reassess once we address the positive urine culture issues  4. Fecal incontinence Contributing to positive urine culture as his skin is most likely contaminated with E.coli and due to body habitus, he is unlikely to get completely clean after his FI's                                            Return for RTC tomorrow for CATH UA and culture .  These notes generated with voice recognition software. I apologize for typographical errors.  Zara Council, PA-C  [facility] [address]  [place], [place] [address] [phone]

## 2017-08-19 ENCOUNTER — Ambulatory Visit: Payer: Medicare HMO | Admitting: Urology

## 2017-08-19 ENCOUNTER — Encounter: Payer: Self-pay | Admitting: Urology

## 2017-08-19 VITALS — BP 142/86 | HR 108 | Ht 68.0 in | Wt 283.3 lb

## 2017-08-19 DIAGNOSIS — R351 Nocturia: Secondary | ICD-10-CM | POA: Diagnosis not present

## 2017-08-19 DIAGNOSIS — Z87440 Personal history of urinary (tract) infections: Secondary | ICD-10-CM | POA: Diagnosis not present

## 2017-08-19 LAB — URINALYSIS, COMPLETE
Bilirubin, UA: NEGATIVE
Glucose, UA: NEGATIVE
Ketones, UA: NEGATIVE
Nitrite, UA: POSITIVE — AB
Specific Gravity, UA: >1.03 — ABNORMAL HIGH (ref 1.005–1.030)
Urobilinogen, Ur: 0.2 mg/dL (ref 0.2–1.0)
pH, UA: 5 (ref 5.0–7.5)

## 2017-08-19 LAB — MICROSCOPIC EXAMINATION: Epithelial Cells (non renal): NONE SEEN /hpf (ref 0–10)

## 2017-08-19 LAB — BLADDER SCAN AMB NON-IMAGING: Scan Result: 14

## 2017-08-20 ENCOUNTER — Ambulatory Visit (INDEPENDENT_AMBULATORY_CARE_PROVIDER_SITE_OTHER): Payer: Medicare HMO | Admitting: Family Medicine

## 2017-08-20 ENCOUNTER — Telehealth: Payer: Self-pay

## 2017-08-20 DIAGNOSIS — Z87440 Personal history of urinary (tract) infections: Secondary | ICD-10-CM | POA: Diagnosis not present

## 2017-08-20 LAB — URINALYSIS, COMPLETE
Bilirubin, UA: NEGATIVE
Glucose, UA: NEGATIVE
Ketones, UA: NEGATIVE
Nitrite, UA: POSITIVE — AB
Specific Gravity, UA: >1.03 — ABNORMAL HIGH (ref 1.005–1.030)
Urobilinogen, Ur: 0.2 mg/dL (ref 0.2–1.0)
pH, UA: 5 (ref 5.0–7.5)

## 2017-08-20 LAB — MICROSCOPIC EXAMINATION
Epithelial Cells (non renal): NONE SEEN /hpf (ref 0–10)
RBC, UA: NONE SEEN /hpf (ref 0–2)

## 2017-08-20 LAB — PSA: Prostate Specific Ag, Serum: 0.9 ng/mL (ref 0.0–4.0)

## 2017-08-20 NOTE — Telephone Encounter (Signed)
-----   Message from Nori Riis, PA-C sent at 08/20/2017  7:30 AM EDT ----- Please let Mr. Geisinger know that his PSA is normal.  He also needs to have his CATH UA and culture and should be coming in today.

## 2017-08-20 NOTE — Progress Notes (Signed)
In and Out Catheterization  Patient is present today for a I & O catheterization due to recurrent UTI. Patient was cleaned and prepped in a sterile fashion with betadine and Lidocaine 2% jelly was instilled into the urethra.  A 14FR cath was inserted no complications were noted , 20m of urine return was noted, urine was yellow in color. A clean urine sample was collected for UA, UCX. Bladder was drained  And catheter was removed with out difficulty.    Preformed by: CElberta Leatherwood CMA

## 2017-08-20 NOTE — Telephone Encounter (Signed)
Patient notified and UA was collected.

## 2017-08-24 ENCOUNTER — Telehealth: Payer: Self-pay

## 2017-08-24 LAB — CULTURE, URINE COMPREHENSIVE

## 2017-08-24 MED ORDER — NITROFURANTOIN MONOHYD MACRO 100 MG PO CAPS: 100.0000 mg | ORAL_CAPSULE | Freq: Two times a day (BID) | ORAL | 0 refills | Status: AC

## 2017-08-24 NOTE — Telephone Encounter (Signed)
Left message

## 2017-08-24 NOTE — Telephone Encounter (Signed)
Patient notified.  Follow up appointment scheduled.

## 2017-08-24 NOTE — Telephone Encounter (Signed)
-----   Message from Nori Riis, PA-C sent at 08/24/2017  2:32 PM EDT ----- Please let Mr. Arakawa know that his urine culture is positive.  He needs to start Macrobid 100 mg twice daily for seven days.   We will need to see him in three weeks.

## 2017-09-04 ENCOUNTER — Ambulatory Visit (INDEPENDENT_AMBULATORY_CARE_PROVIDER_SITE_OTHER): Payer: Medicare HMO

## 2017-09-04 DIAGNOSIS — I482 Chronic atrial fibrillation, unspecified: Secondary | ICD-10-CM

## 2017-09-04 LAB — POCT INR
INR: 3.2 — AB (ref 2.0–3.0)
PT: 38.3

## 2017-09-12 NOTE — Progress Notes (Deleted)
09/14/2017 10:33 AM   Christian Berg 22-Jun-1950 [id number]  Referring provider: [name], [address], [place] [address]  No chief complaint on file.   HPI: Patient is a 67 year old Caucasian male with a history of hematuria, recurrent UTI's, fecal incontinence, and nocturia who presents today for follow up with his wife, ***.      History of hematuria CTU, cystoscopy with RTG's completed in 07/2014 were negative for malignancies.  Findings positive for a 2 mm stone in his left kidney.  He does not report any gross hematuria.     History of nephrolithiasis 2 mm left renal stone seen on CTU in 07/2014.  No complaint of flank pain.  No passage of fragments.    History of recurrent UTI's + E.coli resistant to ampicillin, ciprofloxacin, levofloxacin and tobramycin on 07/16/2017 + E.coli resistant to ampicillin, ciprofloxacin, levofloxacin and tobramycin and proteus mirabilis resistant to nitrofurantoin on 06/26/2017 + E.coli resistant to Augmentin, ampicillin, Cipro, Levaquin and tobramycin on 06/03/2017 + E.coli resistant to Augmentin, ampicillin, Cipro, Levaquin and tobramycin on 05/21/2017 + E.coli resistant to ampicillin, Cipro, Levaquin and tobramycin on 04/30/2017 + E.coli resistant to ampicillin, Cipro, Levaquin and tobramycin on 02/23/2017  Risk factors for UTI's: age, DM,   He states that when he has an UTI, he goes more frequently.  He states his urine is positive for infections when he is asymptomatic.    Today, he is having frequency x 3-4  and nocturia x 3.  He does not have incontinence.  Patient denies any gross hematuria, dysuria or suprapubic/flank pain.  Patient denies any fevers, chills, nausea or vomiting.   His PVR is 14 mL.     He is having episodes of diarrhea once weekly.  He is drinking 4 glasses of water daily.  He has been drinking [employer], [employer] and [employer].  He drinks an occasional sweet tea.  He does not drink  coffee.  He does not drink alcohol.       PMH: Past Medical History:  Diagnosis Date  . A-fib ([place])   . CHF (congestive heart failure) ([place])   . Degenerative disc disease, lumbar    s/p injury  . Diabetes mellitus without complication ([place])   . Disseminated superficial actinic porokeratosis   . Dysrhythmia    A-FIB, palpatations  . Hypercholesteremia   . Hypertension   . Kidney stones   . Peripheral vascular disease ([place])    Carotid stenosis  . Presence of permanent cardiac pacemaker    Inactive  . TIA (transient ischemic attack) 2011   No deficits    Surgical History: Past Surgical History:  Procedure Laterality Date  . APPENDECTOMY  2004  . CARDIAC CATHETERIZATION    . CARDIAC PACEMAKER PLACEMENT  2005  . CATARACT EXTRACTION W/PHACO Right 06/14/2014   Procedure: CATARACT EXTRACTION PHACO AND INTRAOCULAR LENS PLACEMENT (IOC);  Surgeon: Leandrew Koyanagi, MD;  Location: [facility];  Service: Ophthalmology;  Laterality: Right;  . CYSTOSCOPY W/ RETROGRADES Bilateral 08/09/2014   Procedure: CYSTOSCOPY WITH RETROGRADE PYELOGRAM;  Surgeon: Hollice Espy, MD;  Location: [facility];  Service: Urology;  Laterality: Bilateral;  . INTUBATION-ENDOTRACHEAL WITH TRACHEOSTOMY STANDBY  03/30/2017   Procedure: INTUBATION-ENDOTRACHEAL WITH TRACHEOSTOMY STANDBY;  Surgeon: Carloyn Manner, MD;  Location: [facility];  Service: ENT;;    Home Medications:  Allergies as of 09/14/2017      Reactions   Clindamycin/lincomycin    Chest discomfort   Sulfa Antibiotics Hives  Sulfasalazine Hives      Medication List        Accurate as of 09/12/17 10:33 AM. Always use your most recent med list.          aspirin EC 81 MG tablet Take 81 mg by mouth every evening.   glipiZIDE 10 MG tablet Commonly known as:  GLUCOTROL Take 1 tablet (10 mg total) by mouth 2 (two) times daily before a meal. Take 30 minutes before a meal   lansoprazole 15 MG capsule Commonly known as:  PREVACID Take  15-30 mg by mouth daily as needed. For heartburn/reflux   lisinopril 20 MG tablet Commonly known as:  PRINIVIL,ZESTRIL TAKE ONE TABLET BY MOUTH ONCE DAILY   lovastatin 10 MG tablet Commonly known as:  MEVACOR Take 2 tablets (20 mg total) by mouth daily.   metFORMIN 850 MG tablet Commonly known as:  GLUCOPHAGE Take 1 tablet (850 mg total) by mouth 2 (two) times daily with a meal.   metoprolol tartrate 100 MG tablet Commonly known as:  LOPRESSOR Take 1 tablet (100 mg total) by mouth at bedtime.   nystatin cream Commonly known as:  MYCOSTATIN Apply 1 application topically 2 (two) times daily.   traMADol-acetaminophen 37.5-325 MG tablet Commonly known as:  ULTRACET Take 2 tablets by mouth 4 (four) times daily.   warfarin 5 MG tablet Commonly known as:  COUMADIN Take as directed by the anticoagulation clinic. If you are unsure how to take this medication, talk to your nurse or doctor. Original instructions:  Take 1 tablet (5 mg total) by mouth daily.       Allergies:  Allergies  Allergen Reactions  . Clindamycin/Lincomycin     Chest discomfort  . Sulfa Antibiotics Hives  . Sulfasalazine Hives    Family History: Family History  Adopted: Yes  Problem Relation Age of Onset  . Heart disease Mother   . Fibromyalgia Sister   . Hypertension Daughter   . Migraines Daughter   . Kidney disease Neg Hx   . Prostate cancer Neg Hx   . Bladder Cancer Neg Hx     Social History:  reports that he has never smoked. He has never used smokeless tobacco. He reports that he does not drink alcohol or use drugs.  ROS:                                        Physical Exam: There were no vitals taken for this visit.  Constitutional: Well nourished. Alert and oriented, No acute distress. HEENT: [place] AT, moist mucus membranes. Trachea midline, no masses. Cardiovascular: No clubbing, cyanosis, or edema. Respiratory: Normal respiratory effort, no increased work of  breathing. Skin: No rashes, bruises or suspicious lesions. Lymph: No cervical or inguinal adenopathy. Neurologic: Grossly intact, no focal deficits, moving all 4 extremities. Psychiatric: Normal mood and affect.   Laboratory Data: PSA 0.9 in 07/2017  Lab Results  Component Value Date   WBC 5.8 06/03/2017   HGB 11.1 (L) 06/03/2017   HCT 34.3 (L) 06/03/2017   MCV 102 (H) 06/03/2017   PLT 179 06/03/2017    Lab Results  Component Value Date   CREATININE 1.54 (H) 05/22/2017    No results found for: PSA  No results found for: TESTOSTERONE  Lab Results  Component Value Date   HGBA1C 6.6 (A) 07/16/2017    Lab Results  Component Value Date  TSH 2.050 05/22/2017       Component Value Date/Time   CHOL 105 05/24/2015 1058   HDL 36 (L) 05/24/2015 1058   CHOLHDL 2.9 05/24/2015 1058   LDLCALC 42 05/24/2015 1058    Lab Results  Component Value Date   AST 77 (H) 04/04/2017   Lab Results  Component Value Date   ALT 50 04/04/2017   No components found for: ALKALINEPHOPHATASE No components found for: BILIRUBINTOTAL  No results found for: ESTRADIOL  Urinalysis *** I have reviewed the labs.   Pertinent Imaging: ***    Assessment & Plan:    1. Recurrent UTI's *** Criteria for recurrent UTI has been met with 2 or more infections in 6 months or 3 or greater infections in one year  Patient is instructed to increase their water intake until the urine is pale yellow or clear (10 to 12 cups daily) and strongly advised to cut out all sodas and surgery drinks  Patient is instructed to take probiotics and take cranberry pills  Explained to the patient colonization of urine vs infection  - reviewed symptoms of UTI (fevers, chills, gross hematuria, mental status changes, dysuria, suprapubic pain, back pain and/or sudden worsening of urinary symptoms) and advised not to have urine checked or be treated for UTI if not experiencing symptoms advised them to have CATH UA's for  urinalysis and culture to prevent skin and/or rectal contamination of the specimen discussed antibiotic stewardship with the patient - explained the risk of increasing risk of antibiotic resistance with continuous exposure to antibiotics, renal failure, hypoglycemia, C. Diff infection, allergic reactions, etc.   Will not send this UA for culture - patient will return tomorrow for CATH UA and urine culture   2. BPH with LUTS Continue conservative management, avoiding bladder irritants and timed voiding's Most bothersome symptoms is/are nocturia x 3 PSA is drawn today  3. Nocturia  - I explained to the patient that nocturia is often multi-factorial and difficult to treat.  Sleeping disorders, heart conditions, peripheral vascular disease, diabetes, an enlarged prostate for men, an urethral stricture causing bladder outlet obstruction and/or certain medications can contribute to nocturia.  - I have suggested that the patient avoid caffeine after noon and alcohol in the evening.  He or she may also benefit from fluid restrictions after 6:00 in the evening and voiding just prior to bedtime.  - I have explained that research studies have showed that over 84% of patients with sleep apnea reported frequent nighttime urination.   With sleep apnea, oxygen decreases, carbon dioxide increases, the blood become more acidic, the heart rate drops and blood vessels in the lung constrict.  The body is then alerted that something is very wrong. The sleeper must wake enough to reopen the airway. By this time, the heart is racing and experiences a false signal of fluid overload. The heart excretes a hormone-like protein that tells the body to get rid of sodium and water, resulting in nocturia.  -  I also informed the patient that a recent study noted that decreasing sodium intake to 2.3 grams daily, if they don't have issues with hyponatremia, can also reduce the number of nightly voids  - The patient may benefit from a  discussion with his or her primary care physician to see if he or she has risk factors for sleep apnea or other sleep disturbances and obtaining a sleep study.   4. Fecal incontinence Contributing to positive urine culture as his skin is most likely  contaminated with E.coli and due to body habitus, he is unlikely to get completely clean after his FI's                                            No follow-ups on file.  These notes generated with voice recognition software. I apologize for typographical errors.  Zara Council, PA-C  [facility] [address]  [place], [place] [address] [phone]

## 2017-09-14 ENCOUNTER — Ambulatory Visit: Payer: Medicare HMO | Admitting: Urology

## 2017-09-15 ENCOUNTER — Other Ambulatory Visit: Payer: Self-pay | Admitting: Family Medicine

## 2017-09-15 DIAGNOSIS — E119 Type 2 diabetes mellitus without complications: Secondary | ICD-10-CM

## 2017-10-02 ENCOUNTER — Ambulatory Visit (INDEPENDENT_AMBULATORY_CARE_PROVIDER_SITE_OTHER): Payer: Medicare HMO

## 2017-10-02 DIAGNOSIS — I482 Chronic atrial fibrillation, unspecified: Secondary | ICD-10-CM

## 2017-10-02 LAB — POCT INR
INR: 3.1 — AB (ref 2.0–3.0)
PT: 37.8

## 2017-10-02 NOTE — Patient Instructions (Signed)
Description   Hold today's dose, then Continue 7.23m daily. Recheck in 3 weeks.

## 2017-10-16 ENCOUNTER — Encounter: Payer: Self-pay | Admitting: Family Medicine

## 2017-10-16 ENCOUNTER — Ambulatory Visit (INDEPENDENT_AMBULATORY_CARE_PROVIDER_SITE_OTHER): Payer: Medicare HMO | Admitting: Family Medicine

## 2017-10-16 ENCOUNTER — Ambulatory Visit: Payer: Self-pay | Admitting: Family Medicine

## 2017-10-16 VITALS — BP 126/84 | HR 103 | Temp 98.6°F | Resp 16 | Wt 288.6 lb

## 2017-10-16 DIAGNOSIS — Z23 Encounter for immunization: Secondary | ICD-10-CM | POA: Diagnosis not present

## 2017-10-16 DIAGNOSIS — E7800 Pure hypercholesterolemia, unspecified: Secondary | ICD-10-CM | POA: Diagnosis not present

## 2017-10-16 DIAGNOSIS — I10 Essential (primary) hypertension: Secondary | ICD-10-CM

## 2017-10-16 DIAGNOSIS — E119 Type 2 diabetes mellitus without complications: Secondary | ICD-10-CM

## 2017-10-16 LAB — POCT GLYCOSYLATED HEMOGLOBIN (HGB A1C): Hemoglobin A1C: 7.3 % — AB (ref 4.0–5.6)

## 2017-10-16 NOTE — Progress Notes (Signed)
  Subjective:     Patient ID: Christian Berg, male   DOB: 05-07-1950, 67 y.o.   MRN: [id number] Chief Complaint  Patient presents with  . Diabetes    Patient returns to office today for 3 month follow up from 07/16/17. At last office visit HgbA1C was 6.6%, patient reports good compliance and tolerance on medication. Patient states that he has had one hyperglycemia episode since last visit which caused weakness. He denies visual changes, polydipsia, polyuria or changes to feet for wounds or sores.    HPI States he has had some dietary discretions ( he craves sweets) since last visit. Weight is up 5#. Had initial urology evaluation with cath urine specimen which was very uncomfortable. Does not wish to follow up. States he is tolerant of nocturia x 2-3. Accompanied by his wife, [name].  Review of Systems  Respiratory: Negative for shortness of breath.   Cardiovascular: Negative for chest pain and palpitations.       Objective:   Physical Exam  Constitutional: He appears well-developed and well-nourished. No distress.  Cardiovascular:  Irregular rhythm  Pulmonary/Chest: Breath sounds normal.  Musculoskeletal: Edema: trace in distal lower extremities.       Assessment:    1. Type 2 diabetes mellitus without complication, without long-term current use of insulin (HCC) - POCT glycosylated hemoglobin (Hb A1C)  2. Need for influenza vaccination - Flu vaccine HIGH DOSE PF  3. Need for vaccination against Streptococcus pneumoniae Prevnar 13  4. Hypercholesteremia - Lipid panel  5. Benign essential HTN - Comprehensive metabolic panel    Plan:    Continue to improve dietary choices. Further f/u pending lab results.

## 2017-10-16 NOTE — Patient Instructions (Signed)
Please get the labs fasting. We will call you with the lab results.

## 2017-10-23 ENCOUNTER — Ambulatory Visit (INDEPENDENT_AMBULATORY_CARE_PROVIDER_SITE_OTHER): Payer: Medicare HMO

## 2017-10-23 DIAGNOSIS — I482 Chronic atrial fibrillation, unspecified: Secondary | ICD-10-CM

## 2017-10-23 DIAGNOSIS — E7800 Pure hypercholesterolemia, unspecified: Secondary | ICD-10-CM | POA: Diagnosis not present

## 2017-10-23 DIAGNOSIS — I10 Essential (primary) hypertension: Secondary | ICD-10-CM | POA: Diagnosis not present

## 2017-10-23 LAB — POCT INR
INR: 3.4 — AB (ref 2.0–3.0)
PT: 40.7

## 2017-10-23 NOTE — Patient Instructions (Signed)
Description   Hold dose for 2 days, then Continue 7.32m daily except 3.767mon Monday and Friday. Recheck in 3 weeks.

## 2017-10-24 LAB — COMPREHENSIVE METABOLIC PANEL
ALT: 19 IU/L (ref 0–44)
AST: 34 IU/L (ref 0–40)
Albumin/Globulin Ratio: 1.3 (ref 1.2–2.2)
Albumin: 3.7 g/dL (ref 3.6–4.8)
Alkaline Phosphatase: 96 IU/L (ref 39–117)
BUN/Creatinine Ratio: 7 — ABNORMAL LOW (ref 10–24)
BUN: 8 mg/dL (ref 8–27)
Bilirubin Total: 1 mg/dL (ref 0.0–1.2)
CO2: 21 mmol/L (ref 20–29)
Calcium: 9.8 mg/dL (ref 8.6–10.2)
Chloride: 104 mmol/L (ref 96–106)
Creatinine, Ser: 1.23 mg/dL (ref 0.76–1.27)
GFR calc Af Amer: 69 mL/min/{1.73_m2} (ref >59)
GFR calc non Af Amer: 60 mL/min/{1.73_m2} (ref >59)
Globulin, Total: 2.9 g/dL (ref 1.5–4.5)
Glucose: 208 mg/dL — ABNORMAL HIGH (ref 65–99)
Potassium: 4.8 mmol/L (ref 3.5–5.2)
Sodium: 142 mmol/L (ref 134–144)
Total Protein: 6.6 g/dL (ref 6.0–8.5)

## 2017-10-24 LAB — LIPID PANEL
Chol/HDL Ratio: 3.2 ratio (ref 0.0–5.0)
Cholesterol, Total: 133 mg/dL (ref 100–199)
HDL: 41 mg/dL (ref >39)
LDL Calculated: 48 mg/dL (ref 0–99)
Triglycerides: 218 mg/dL — ABNORMAL HIGH (ref 0–149)
VLDL Cholesterol Cal: 44 mg/dL — ABNORMAL HIGH (ref 5–40)

## 2017-10-26 ENCOUNTER — Telehealth: Payer: Self-pay

## 2017-10-26 NOTE — Telephone Encounter (Signed)
-----   Message from [name], [place] sent at 10/26/2017  7:31 AM EDT ----- Labs look ok. Cholesterol is well controlled.Kidney function has improved. Sugar is elevated as discussed during the office visit.

## 2017-10-26 NOTE — Telephone Encounter (Signed)
[id number] 

## 2017-10-28 NOTE — Telephone Encounter (Signed)
Pt.advised.KW

## 2017-11-11 ENCOUNTER — Ambulatory Visit: Payer: Self-pay

## 2017-11-13 ENCOUNTER — Ambulatory Visit (INDEPENDENT_AMBULATORY_CARE_PROVIDER_SITE_OTHER): Payer: Medicare HMO

## 2017-11-13 DIAGNOSIS — I482 Chronic atrial fibrillation, unspecified: Secondary | ICD-10-CM

## 2017-11-13 LAB — POCT INR
INR: 2.7 (ref 2.0–3.0)
PT: 32

## 2017-11-13 NOTE — Patient Instructions (Signed)
Continue 7.5m daily expect 3.771mM & F.  Recheck in three weeks.

## 2017-12-05 ENCOUNTER — Encounter: Payer: Self-pay | Admitting: Family Medicine

## 2017-12-05 ENCOUNTER — Ambulatory Visit (INDEPENDENT_AMBULATORY_CARE_PROVIDER_SITE_OTHER): Payer: Medicare HMO | Admitting: Family Medicine

## 2017-12-05 VITALS — BP 160/99 | HR 101 | Temp 98.9°F | Resp 18 | Wt 289.0 lb

## 2017-12-05 DIAGNOSIS — R042 Hemoptysis: Secondary | ICD-10-CM

## 2017-12-05 DIAGNOSIS — R05 Cough: Secondary | ICD-10-CM

## 2017-12-05 DIAGNOSIS — I482 Chronic atrial fibrillation, unspecified: Secondary | ICD-10-CM

## 2017-12-05 DIAGNOSIS — R0602 Shortness of breath: Secondary | ICD-10-CM | POA: Diagnosis not present

## 2017-12-05 DIAGNOSIS — J40 Bronchitis, not specified as acute or chronic: Secondary | ICD-10-CM | POA: Diagnosis not present

## 2017-12-05 DIAGNOSIS — J418 Mixed simple and mucopurulent chronic bronchitis: Secondary | ICD-10-CM | POA: Diagnosis not present

## 2017-12-05 DIAGNOSIS — R059 Cough, unspecified: Secondary | ICD-10-CM

## 2017-12-05 LAB — POCT INR
INR: 2.8 (ref 2.0–3.0)
PT: 33.5

## 2017-12-05 MED ORDER — AMOXICILLIN-POT CLAVULANATE 875-125 MG PO TABS: 1.0000 | ORAL_TABLET | Freq: Two times a day (BID) | ORAL | 0 refills | Status: DC

## 2017-12-05 NOTE — Progress Notes (Signed)
Patient: Christian Berg Male    DOB: 1950-03-16   67 y.o.   MRN: [id number] Visit Date: 12/05/2017  Today's Provider: Wilhemena Durie, MD   Chief Complaint  Patient presents with  . Cough    x 1 day   Subjective:    Cough  This is a new problem. Episode onset: 1 day ago. The problem has been gradually worsening. The cough is productive of sputum and productive of bloody sputum. Associated symptoms include chills, hemoptysis, rhinorrhea, shortness of breath and wheezing. Pertinent negatives include no chest pain, ear pain, fever, headaches, myalgias, nasal congestion, rash or sweats. Treatments tried: Robitussin.       Allergies  Allergen Reactions  . Clindamycin/Lincomycin     Chest discomfort  . Sulfa Antibiotics Hives  . Sulfasalazine Hives     Current Outpatient Medications:  .  aspirin EC 81 MG tablet, Take 81 mg by mouth every evening. , Disp: , Rfl:  .  glipiZIDE (GLUCOTROL) 10 MG tablet, TAKE 1 TABLET BY MOUTH TWICE DAILY TAKE  30  MINUTES  BEFORE  A  MEAL*, Disp: 180 tablet, Rfl: 1 .  lansoprazole (PREVACID) 15 MG capsule, Take 15-30 mg by mouth daily as needed. For heartburn/reflux, Disp: , Rfl:  .  lisinopril (PRINIVIL,ZESTRIL) 20 MG tablet, TAKE ONE TABLET BY MOUTH ONCE DAILY, Disp: 90 tablet, Rfl: 3 .  lovastatin (MEVACOR) 10 MG tablet, Take 2 tablets (20 mg total) by mouth daily. (Patient taking differently: Take 20 mg by mouth at bedtime. ), Disp: 180 tablet, Rfl: 1 .  metFORMIN (GLUCOPHAGE) 850 MG tablet, Take 1 tablet (850 mg total) by mouth 2 (two) times daily with a meal., Disp: 180 tablet, Rfl: 1 .  metoprolol tartrate (LOPRESSOR) 100 MG tablet, Take 1 tablet (100 mg total) by mouth at bedtime. (Patient taking differently: Take 200 mg by mouth 2 (two) times daily. ), Disp: , Rfl:  .  nystatin cream (MYCOSTATIN), Apply 1 application topically 2 (two) times daily. (Patient taking differently: Apply 1 application topically 2 (two) times daily as needed  for dry skin. ), Disp: 30 g, Rfl: 0 .  traMADol-acetaminophen (ULTRACET) 37.5-325 MG tablet, Take 2 tablets by mouth 4 (four) times daily., Disp: 30 tablet, Rfl: 0 .  warfarin (COUMADIN) 5 MG tablet, Take 1 tablet (5 mg total) by mouth daily. (Patient taking differently: Take 7.5 mg by mouth daily. ), Disp: , Rfl:  .  warfarin (COUMADIN) 7.5 MG tablet, TAKE 1 TABLET BY MOUTH ONCE DAILY, Disp: 90 tablet, Rfl: 3  Review of Systems  Constitutional: Positive for chills. Negative for appetite change, fatigue and fever.  HENT: Positive for rhinorrhea. Negative for congestion, ear pain, hearing loss, nosebleeds and trouble swallowing.   Eyes: Negative for pain and visual disturbance.  Respiratory: Positive for hemoptysis, shortness of breath and wheezing. Negative for cough and chest tightness.   Cardiovascular: Negative for chest pain, palpitations and leg swelling.  Gastrointestinal: Negative for abdominal pain, blood in stool, constipation, diarrhea, nausea and vomiting.  Endocrine: Negative for polydipsia, polyphagia and polyuria.  Genitourinary: Negative for dysuria and flank pain.  Musculoskeletal: Negative for arthralgias, back pain, joint swelling, myalgias and neck stiffness.  Skin: Negative for color change, rash and wound.  Neurological: Negative for dizziness, tremors, seizures, speech difficulty, weakness, light-headedness and headaches.  Psychiatric/Behavioral: Negative for behavioral problems, confusion, decreased concentration, dysphoric mood and sleep disturbance. The patient is not nervous/anxious.   All other systems reviewed and are  negative.   Social History   Tobacco Use  . Smoking status: Never Smoker  . Smokeless tobacco: Never Used  Substance Use Topics  . Alcohol use: No    Alcohol/week: 0.0 standard drinks   Objective:   BP (!) 160/99 (BP Location: Left Arm, Patient Position: Sitting, Cuff Size: Large)   Pulse (!) 101   Temp 98.9 F (37.2 C) (Oral)   Resp 18    Wt 289 lb (131.1 kg)   SpO2 96% Comment: room air  BMI 43.94 kg/m  Vitals:   12/05/17 1048  BP: (!) 160/99  Pulse: (!) 101  Resp: 18  Temp: 98.9 F (37.2 C)  TempSrc: Oral  SpO2: 96%  Weight: 289 lb (131.1 kg)     Physical Exam  Constitutional: He is oriented to person, place, and time. He appears well-developed and well-nourished.  HENT:  Head: Normocephalic and atraumatic.  Right Ear: External ear normal.  Left Ear: External ear normal.  Nose: Nose normal.  Eyes: Conjunctivae are normal. No scleral icterus.  Neck: No JVD present. No thyromegaly present.  Cardiovascular: Normal rate, regular rhythm and normal heart sounds.  Pulmonary/Chest: Effort normal. No stridor. No respiratory distress. He has no wheezes. He has rales. He exhibits no tenderness.  Couse BS both bases.  Abdominal: Soft.  Musculoskeletal: He exhibits no edema.  Lymphadenopathy:    He has no cervical adenopathy.  Neurological: He is alert and oriented to person, place, and time.  Skin: Skin is warm and dry.  Psychiatric: He has a normal mood and affect. His behavior is normal. Judgment and thought content normal.        Assessment & Plan:     1. Chronic atrial fibrillation  - POCT INR--therapeutic at 2.8--hold coumadin today due to hemoptysis.  2. Bronchitis Pt states he feels ok. Wishes to not go to ED. He knows if he gets worse he needs to do so. His wife is home with him. - amoxicillin-clavulanate (AUGMENTIN) 875-125 MG tablet; Take 1 tablet by mouth 2 (two) times daily for 7 days.  Dispense: 20 tablet; Refill: 0 - DG Chest 2 View; Future  3. Cough  - amoxicillin-clavulanate (AUGMENTIN) 875-125 MG tablet; Take 1 tablet by mouth 2 (two) times daily for 7 days.  Dispense: 20 tablet; Refill: 0 - DG Chest 2 View; Future  4. Mixed simple and mucopurulent chronic bronchitis ([place])   5. Cough with hemoptysis Small amount of bright red blood spit into bottle pt brings in. He says this has  happened when he gets sick since 2007. Follow clinically for now.More than 50% of 25 minute spent in coordination of care/counseling.  DG Chest 2 View; Future  6. Shortness of breat - DG Chest 2 View; Future Oxygen level good       Wilhemena Durie, MD  [facility]

## 2017-12-07 ENCOUNTER — Telehealth: Payer: Self-pay | Admitting: Family Medicine

## 2017-12-07 NOTE — Telephone Encounter (Signed)
Patient was advised that order for x-ray had been done as future order and that we could release it for him to have done. Patient states that he does not have funds for copayment to get x-ray done. Patient was seen by Dr. Rosanna Randy on 12/05/17 please review and advise.KW

## 2017-12-07 NOTE — Telephone Encounter (Signed)
Christian Berg this is Christian Berg's patient, Dr Rosanna Randy is not here today and Christian Berg will know his situation better than we will, please have Christian Berg review and advise

## 2017-12-07 NOTE — Telephone Encounter (Signed)
Ok to skip x-ray if starting to feel better.

## 2017-12-07 NOTE — Telephone Encounter (Signed)
Patient seen by Dr. Rosanna Randy on 12/05/17 please see message below and advise. KW

## 2017-12-07 NOTE — Telephone Encounter (Signed)
Pt returned missed call. Wanting to let Roshena know pt didn't have anything on checkout sheet regarding getting an Xray. Xray has not been done.  Please call pt back to advise.  Thanks, [employer]

## 2017-12-07 NOTE — Telephone Encounter (Signed)
[id number]

## 2017-12-08 ENCOUNTER — Ambulatory Visit (INDEPENDENT_AMBULATORY_CARE_PROVIDER_SITE_OTHER): Payer: Medicare HMO | Admitting: Family Medicine

## 2017-12-08 ENCOUNTER — Encounter: Payer: Self-pay | Admitting: Family Medicine

## 2017-12-08 VITALS — BP 112/80 | HR 100 | Temp 99.6°F | Resp 16 | Wt 288.0 lb

## 2017-12-08 DIAGNOSIS — J40 Bronchitis, not specified as acute or chronic: Secondary | ICD-10-CM

## 2017-12-08 NOTE — Patient Instructions (Signed)
Continue antibiotic and Mucinex DM. Call Friday with how you are doing and we will decide about extending the antibiotic coverage.

## 2017-12-08 NOTE — Telephone Encounter (Signed)
Patient was seen and evaluated by physician in office today. KW

## 2017-12-08 NOTE — Progress Notes (Signed)
  Subjective:     Patient ID: Christian Berg, male   DOB: 24-Dec-1950, 67 y.o.   MRN: [id number] Chief Complaint  Patient presents with  . Bronchitis    Patient returns to office today for follow up visit from 12/05/17. Patient states that prior to office visit he had been experiencing cough for 2 days. Patient reports that he has had good compliance on Augmentin but at time is coughing bloody sputum. Patient denies shortness of breath or wheezing.    HPI States he continues to see blood in his sputum which he reports is clear. Reports he feels much better on Augmentin. Unable to get the CXR due to finances. Accompanied by his wife, Christian Berg.  Review of Systems     Objective:   Physical Exam  Constitutional: He appears well-developed and well-nourished. No distress.  Ears: T.M's intact without inflammation Throat: no tonsillar enlargement or exudate Neck: no cervical adenopathy Lungs: bibasilar fine crackles Extremities: no distal edema.    Assessment:    1. Bronchitis: continue Augmentin and Mucinex DM (samples provided)     Plan:    Phone f/u in 3 days.

## 2017-12-10 ENCOUNTER — Ambulatory Visit: Payer: Medicare HMO

## 2017-12-10 ENCOUNTER — Other Ambulatory Visit: Payer: Self-pay | Admitting: Family Medicine

## 2017-12-10 ENCOUNTER — Telehealth: Payer: Self-pay | Admitting: Family Medicine

## 2017-12-10 DIAGNOSIS — R05 Cough: Secondary | ICD-10-CM

## 2017-12-10 DIAGNOSIS — J40 Bronchitis, not specified as acute or chronic: Secondary | ICD-10-CM

## 2017-12-10 DIAGNOSIS — R059 Cough, unspecified: Secondary | ICD-10-CM

## 2017-12-10 MED ORDER — AMOXICILLIN-POT CLAVULANATE 875-125 MG PO TABS: ORAL_TABLET | ORAL | 0 refills | Status: DC

## 2017-12-10 NOTE — Telephone Encounter (Signed)
Please review chart and advise. KW

## 2017-12-10 NOTE — Telephone Encounter (Signed)
See below. KW

## 2017-12-10 NOTE — Telephone Encounter (Signed)
Extended course of Augmentin for another 3 days. Patient was unable to get CXR due to financial concerns

## 2017-12-10 NOTE — Telephone Encounter (Signed)
Pt requesting a refill of amoxicillin-clavulanate (AUGMENTIN) 875-125 MG tablet sent into [place].  States he isn't getting much better.

## 2017-12-23 ENCOUNTER — Other Ambulatory Visit: Payer: Self-pay | Admitting: Family Medicine

## 2017-12-23 DIAGNOSIS — E785 Hyperlipidemia, unspecified: Principal | ICD-10-CM

## 2017-12-23 DIAGNOSIS — E1169 Type 2 diabetes mellitus with other specified complication: Secondary | ICD-10-CM

## 2017-12-23 NOTE — Telephone Encounter (Signed)
Last filled 08/25/16.KW

## 2018-01-07 ENCOUNTER — Encounter: Payer: Self-pay | Admitting: Family Medicine

## 2018-01-07 ENCOUNTER — Ambulatory Visit (INDEPENDENT_AMBULATORY_CARE_PROVIDER_SITE_OTHER): Payer: Medicare HMO | Admitting: Family Medicine

## 2018-01-07 VITALS — BP 136/86 | HR 108 | Temp 98.9°F | Resp 16 | Wt 289.0 lb

## 2018-01-07 DIAGNOSIS — R918 Other nonspecific abnormal finding of lung field: Secondary | ICD-10-CM

## 2018-01-07 DIAGNOSIS — R059 Cough, unspecified: Secondary | ICD-10-CM

## 2018-01-07 DIAGNOSIS — R05 Cough: Secondary | ICD-10-CM

## 2018-01-07 MED ORDER — DOXYCYCLINE HYCLATE 100 MG PO TABS: 100.0000 mg | ORAL_TABLET | Freq: Two times a day (BID) | ORAL | 0 refills | Status: DC

## 2018-01-07 NOTE — Patient Instructions (Signed)
Let me know if not improving over the next few days. Should expect further cold sx.

## 2018-01-07 NOTE — Progress Notes (Signed)
  Subjective:     Patient ID: KYIAN OBST, male   DOB: Oct 28, 1950, 68 y.o.   MRN: [id number] Chief Complaint  Patient presents with  . URI    Patient comes into office today with complaints of cold like symptoms over the past 24hrs. Patient reports feeling "feverish", chest congestion, cough and fatigue. Patient denies taking any otc medication for symptom relief.    HPI States he has produced a gray appearing sputum on one occasion.  Review of Systems     Objective:   Physical Exam Constitutional:      General: He is not in acute distress.    Appearance: He is ill-appearing.  Neurological:     Mental Status: He is alert.   Ears: T.M's intact without inflammation Throat: no tonsillar enlargement or exudate Neck: no cervical adenopathy Lungs: Bibasilar crackles L>R.     Assessment:    Cough - Plan: doxycycline (VIBRA-TABS) 100 MG tablet  Lung field abnormal finding on examination - Plan: doxycycline (VIBRA-TABS) 100 MG tablet     Plan:    Call if not improving or fever. Told to expect further cold sx. Samples of Mucinex provided.

## 2018-01-08 ENCOUNTER — Other Ambulatory Visit: Payer: Self-pay | Admitting: Family Medicine

## 2018-01-08 DIAGNOSIS — I10 Essential (primary) hypertension: Secondary | ICD-10-CM

## 2018-01-15 ENCOUNTER — Ambulatory Visit: Payer: Self-pay | Admitting: Family Medicine

## 2018-01-22 ENCOUNTER — Ambulatory Visit (INDEPENDENT_AMBULATORY_CARE_PROVIDER_SITE_OTHER): Payer: Medicare HMO | Admitting: Family Medicine

## 2018-01-22 ENCOUNTER — Encounter: Payer: Self-pay | Admitting: Family Medicine

## 2018-01-22 ENCOUNTER — Telehealth: Payer: Self-pay | Admitting: Family Medicine

## 2018-01-22 VITALS — BP 126/80 | HR 100 | Temp 98.7°F | Resp 16 | Ht 68.0 in | Wt 291.0 lb

## 2018-01-22 DIAGNOSIS — R059 Cough, unspecified: Secondary | ICD-10-CM

## 2018-01-22 DIAGNOSIS — R05 Cough: Secondary | ICD-10-CM

## 2018-01-22 DIAGNOSIS — R918 Other nonspecific abnormal finding of lung field: Secondary | ICD-10-CM

## 2018-01-22 MED ORDER — FUROSEMIDE 20 MG PO TABS: 20.0000 mg | ORAL_TABLET | Freq: Every day | ORAL | 0 refills | Status: DC

## 2018-01-22 MED ORDER — DOXYCYCLINE HYCLATE 100 MG PO TABS: 100.0000 mg | ORAL_TABLET | Freq: Two times a day (BID) | ORAL | 0 refills | Status: DC

## 2018-01-22 NOTE — Telephone Encounter (Signed)
Pt has finished the doxycycline (VIBRA-TABS) 100 MG tablet.  He is now having the symptoms come back. Pt asking if something else can be called in for him?  Please advise.  Thanks, [employer]

## 2018-01-22 NOTE — Progress Notes (Signed)
  Subjective:     Patient ID: Christian Berg, male   DOB: [birth year], 67 y.o.   MRN: [id number] Chief Complaint  Patient presents with  . URI    Patient comes in today c/o URI that has worsened for the last 2 days. He has had cough, congestion, and PND. Denies fever. He has taken cough medication OTC which has helped a little. Patient reports that he has had recurrent bronchitis for the last 2 months.   HPI Reports throat congestion and accompanying cough-night > day- since 12/25. No fever or other cold sx. No increased s.o.b. Has taken expectorant for his sx. Has hx of A.fib and CHF.  Review of Systems     Objective:   Physical Exam Constitutional:      General: He is not in acute distress.    Appearance: He is not ill-appearing.  Musculoskeletal:     Right lower leg: No edema.     Left lower leg: No edema.  Neurological:     Mental Status: He is alert.   Ears: T.M's intact without inflammation Sinuses: non-tender Throat: no tonsillar enlargement or exudate Neck: no cervical adenopathy Lungs: clear     Assessment:    1. Cough: to start furosemide 20 daily #7. - doxycycline (VIBRA-TABS) 100 MG tablet; Take 1 tablet (100 mg total) by mouth 2 (two) times daily.  Dispense: 14 tablet; Refill: 0 - DG Chest 2 View; Future  2. Lung field abnormal finding on examination - doxycycline (VIBRA-TABS) 100 MG tablet; Take 1 tablet (100 mg total) by mouth 2 (two) times daily.  Dispense: 14 tablet; Refill: 0 - DG Chest 2 View; Future    Plan:    If congestion not improving with furosemide or purulent sputum, start the abx. Further f/u pending x-ray results.

## 2018-01-22 NOTE — Progress Notes (Signed)
Patient: Christian Berg Male    DOB: [birth year]   67 y.o.   MRN: [id number] Visit Date: 01/22/2018  Today's Provider: Lelon Huh, MD   Chief Complaint  Patient presents with  . URI   Subjective:     HPI Patient comes in today c/o URI that has worsened for the last 2 days. He has had cough, congestion, and PND. Denies fever. He has taken cough medication OTC which has helped a little. Patient reports that he has had recurrent bronchitis for the last 2 months.   Allergies  Allergen Reactions  . Clindamycin/Lincomycin     Chest discomfort  . Sulfa Antibiotics Hives  . Sulfasalazine Hives     Current Outpatient Medications:  .  aspirin EC 81 MG tablet, Take 81 mg by mouth every evening. , Disp: , Rfl:  .  glipiZIDE (GLUCOTROL) 10 MG tablet, TAKE 1 TABLET BY MOUTH TWICE DAILY TAKE  30  MINUTES  BEFORE  A  MEAL*, Disp: 180 tablet, Rfl: 1 .  lansoprazole (PREVACID) 15 MG capsule, Take 15-30 mg by mouth daily as needed. For heartburn/reflux, Disp: , Rfl:  .  lisinopril (PRINIVIL,ZESTRIL) 20 MG tablet, TAKE 1 TABLET BY MOUTH ONCE DAILY, Disp: 90 tablet, Rfl: 3 .  lovastatin (MEVACOR) 10 MG tablet, TAKE 2 TABLETS BY MOUTH ONCE DAILY, Disp: 180 tablet, Rfl: 1 .  metFORMIN (GLUCOPHAGE) 850 MG tablet, Take 1 tablet (850 mg total) by mouth 2 (two) times daily with a meal., Disp: 180 tablet, Rfl: 1 .  metoprolol tartrate (LOPRESSOR) 100 MG tablet, Take 1 tablet (100 mg total) by mouth at bedtime. (Patient taking differently: Take 200 mg by mouth 2 (two) times daily. ), Disp: , Rfl:  .  nystatin cream (MYCOSTATIN), Apply 1 application topically 2 (two) times daily. (Patient taking differently: Apply 1 application topically 2 (two) times daily as needed for dry skin. ), Disp: 30 g, Rfl: 0 .  traMADol-acetaminophen (ULTRACET) 37.5-325 MG tablet, Take 2 tablets by mouth 4 (four) times daily., Disp: 30 tablet, Rfl: 0 .  warfarin (COUMADIN) 5 MG tablet, Take 1 tablet (5 mg total) by mouth  daily. (Patient taking differently: Take 7.5 mg by mouth daily. ), Disp: , Rfl:  .  warfarin (COUMADIN) 7.5 MG tablet, TAKE 1 TABLET BY MOUTH ONCE DAILY, Disp: 90 tablet, Rfl: 3 .  doxycycline (VIBRA-TABS) 100 MG tablet, Take 1 tablet (100 mg total) by mouth 2 (two) times daily. (Patient not taking: Reported on 01/22/2018), Disp: 14 tablet, Rfl: 0  Review of Systems  Constitutional: Positive for fatigue.  HENT: Positive for congestion, postnasal drip, sinus pain, sneezing, sore throat, trouble swallowing and voice change.   Respiratory: Positive for cough.   Neurological: Negative for dizziness, light-headedness and headaches.    Social History   Tobacco Use  . Smoking status: Never Smoker  . Smokeless tobacco: Never Used  Substance Use Topics  . Alcohol use: No    Alcohol/week: 0.0 standard drinks      Objective:   BP 126/80 (BP Location: Left Arm, Patient Position: Sitting, Cuff Size: Large)   Pulse 100   Temp 98.7 F (37.1 C)   Resp 16   Ht 5' 8"  (1.727 m)   Wt 291 lb (132 kg)   SpO2 98%   BMI 44.25 kg/m  Vitals:   01/22/18 1610  BP: 126/80  Pulse: 100  Resp: 16  Temp: 98.7 F (37.1 C)  SpO2: 98%  Weight: 291 lb (  132 kg)  Height: 5' 8"  (1.727 m)     Physical Exam      Assessment & Plan    Patient moved to [facility] schedule. Not seen by me.     Lelon Huh, MD  [facility]

## 2018-01-22 NOTE — Telephone Encounter (Signed)
He will be need to be seen for this. As no openings today may try Saturday clinic or Urgent Care

## 2018-01-22 NOTE — Patient Instructions (Signed)
Try the fluid pill first and get the chest x-ray tomorrow if able. If increased colored sputum start the antibiotic and stop the fluid pill.

## 2018-01-22 NOTE — Telephone Encounter (Signed)
Please review.  dbs

## 2018-01-22 NOTE — Telephone Encounter (Signed)
Pt scheduled with Dr Caryn Section today at [place].  dbs

## 2018-01-23 ENCOUNTER — Other Ambulatory Visit: Payer: Self-pay | Admitting: Family Medicine

## 2018-01-23 DIAGNOSIS — I10 Essential (primary) hypertension: Secondary | ICD-10-CM

## 2018-01-28 ENCOUNTER — Ambulatory Visit
Admission: RE | Admit: 2018-01-28 | Discharge: 2018-01-28 | Disposition: A | Discharge status: Home / Self Care | Payer: Medicare HMO | Source: Ambulatory Visit | Attending: Family Medicine | Admitting: Family Medicine

## 2018-01-28 ENCOUNTER — Ambulatory Visit
Admission: RE | Admit: 2018-01-28 | Discharge: 2018-01-28 | Disposition: A | Discharge status: Home / Self Care | Payer: Medicare HMO | Attending: Family Medicine | Admitting: Family Medicine

## 2018-01-28 DIAGNOSIS — R918 Other nonspecific abnormal finding of lung field: Secondary | ICD-10-CM

## 2018-01-28 DIAGNOSIS — R05 Cough: Secondary | ICD-10-CM

## 2018-01-28 DIAGNOSIS — R059 Cough, unspecified: Secondary | ICD-10-CM

## 2018-02-02 NOTE — Patient Instructions (Signed)
.   Please bring all of your medications to every appointment so we can make sure that our medication list is the same as yours.

## 2018-02-20 ENCOUNTER — Other Ambulatory Visit: Payer: Self-pay | Admitting: Family Medicine

## 2018-03-12 ENCOUNTER — Other Ambulatory Visit: Payer: Self-pay | Admitting: Family Medicine

## 2018-03-12 DIAGNOSIS — I10 Essential (primary) hypertension: Secondary | ICD-10-CM

## 2018-03-25 ENCOUNTER — Other Ambulatory Visit: Payer: Self-pay | Admitting: Family Medicine

## 2018-03-25 DIAGNOSIS — E119 Type 2 diabetes mellitus without complications: Secondary | ICD-10-CM

## 2018-04-29 ENCOUNTER — Emergency Department: Payer: Medicare HMO

## 2018-04-29 ENCOUNTER — Inpatient Hospital Stay
Admission: EM | Admit: 2018-04-29 | Discharge: 2018-05-02 | DRG: 872 | Disposition: A | Discharge status: Home / Self Care | Payer: Medicare HMO | Attending: Internal Medicine | Admitting: Internal Medicine

## 2018-04-29 ENCOUNTER — Other Ambulatory Visit: Payer: Self-pay

## 2018-04-29 DIAGNOSIS — A419 Sepsis, unspecified organism: Principal | ICD-10-CM | POA: Diagnosis present

## 2018-04-29 DIAGNOSIS — I5032 Chronic diastolic (congestive) heart failure: Secondary | ICD-10-CM | POA: Diagnosis present

## 2018-04-29 DIAGNOSIS — E1151 Type 2 diabetes mellitus with diabetic peripheral angiopathy without gangrene: Secondary | ICD-10-CM | POA: Diagnosis present

## 2018-04-29 DIAGNOSIS — Z7982 Long term (current) use of aspirin: Secondary | ICD-10-CM

## 2018-04-29 DIAGNOSIS — Z7901 Long term (current) use of anticoagulants: Secondary | ICD-10-CM | POA: Diagnosis not present

## 2018-04-29 DIAGNOSIS — N179 Acute kidney failure, unspecified: Secondary | ICD-10-CM | POA: Diagnosis not present

## 2018-04-29 DIAGNOSIS — N401 Enlarged prostate with lower urinary tract symptoms: Secondary | ICD-10-CM | POA: Diagnosis present

## 2018-04-29 DIAGNOSIS — Z9841 Cataract extraction status, right eye: Secondary | ICD-10-CM

## 2018-04-29 DIAGNOSIS — R509 Fever, unspecified: Secondary | ICD-10-CM | POA: Diagnosis not present

## 2018-04-29 DIAGNOSIS — Z20828 Contact with and (suspected) exposure to other viral communicable diseases: Secondary | ICD-10-CM | POA: Diagnosis present

## 2018-04-29 DIAGNOSIS — Z7984 Long term (current) use of oral hypoglycemic drugs: Secondary | ICD-10-CM | POA: Diagnosis not present

## 2018-04-29 DIAGNOSIS — E785 Hyperlipidemia, unspecified: Secondary | ICD-10-CM | POA: Diagnosis present

## 2018-04-29 DIAGNOSIS — Z79899 Other long term (current) drug therapy: Secondary | ICD-10-CM | POA: Diagnosis not present

## 2018-04-29 DIAGNOSIS — Z882 Allergy status to sulfonamides status: Secondary | ICD-10-CM

## 2018-04-29 DIAGNOSIS — N189 Chronic kidney disease, unspecified: Secondary | ICD-10-CM | POA: Diagnosis present

## 2018-04-29 DIAGNOSIS — I480 Paroxysmal atrial fibrillation: Secondary | ICD-10-CM | POA: Diagnosis present

## 2018-04-29 DIAGNOSIS — K7581 Nonalcoholic steatohepatitis (NASH): Secondary | ICD-10-CM | POA: Diagnosis present

## 2018-04-29 DIAGNOSIS — E1165 Type 2 diabetes mellitus with hyperglycemia: Secondary | ICD-10-CM | POA: Diagnosis not present

## 2018-04-29 DIAGNOSIS — Z881 Allergy status to other antibiotic agents status: Secondary | ICD-10-CM

## 2018-04-29 DIAGNOSIS — E7800 Pure hypercholesterolemia, unspecified: Secondary | ICD-10-CM | POA: Diagnosis present

## 2018-04-29 DIAGNOSIS — M5136 Other intervertebral disc degeneration, lumbar region: Secondary | ICD-10-CM | POA: Diagnosis present

## 2018-04-29 DIAGNOSIS — Z8673 Personal history of transient ischemic attack (TIA), and cerebral infarction without residual deficits: Secondary | ICD-10-CM

## 2018-04-29 DIAGNOSIS — Z950 Presence of cardiac pacemaker: Secondary | ICD-10-CM | POA: Diagnosis not present

## 2018-04-29 DIAGNOSIS — R0602 Shortness of breath: Secondary | ICD-10-CM | POA: Diagnosis not present

## 2018-04-29 DIAGNOSIS — E119 Type 2 diabetes mellitus without complications: Secondary | ICD-10-CM

## 2018-04-29 DIAGNOSIS — Z20822 Contact with and (suspected) exposure to covid-19: Secondary | ICD-10-CM | POA: Diagnosis present

## 2018-04-29 DIAGNOSIS — N390 Urinary tract infection, site not specified: Secondary | ICD-10-CM | POA: Diagnosis present

## 2018-04-29 DIAGNOSIS — Z87442 Personal history of urinary calculi: Secondary | ICD-10-CM

## 2018-04-29 DIAGNOSIS — R6889 Other general symptoms and signs: Secondary | ICD-10-CM | POA: Diagnosis not present

## 2018-04-29 DIAGNOSIS — E1122 Type 2 diabetes mellitus with diabetic chronic kidney disease: Secondary | ICD-10-CM | POA: Diagnosis not present

## 2018-04-29 DIAGNOSIS — Z961 Presence of intraocular lens: Secondary | ICD-10-CM | POA: Diagnosis present

## 2018-04-29 DIAGNOSIS — R000 Tachycardia, unspecified: Secondary | ICD-10-CM | POA: Diagnosis not present

## 2018-04-29 DIAGNOSIS — I130 Hypertensive heart and chronic kidney disease with heart failure and stage 1 through stage 4 chronic kidney disease, or unspecified chronic kidney disease: Secondary | ICD-10-CM | POA: Diagnosis not present

## 2018-04-29 DIAGNOSIS — I10 Essential (primary) hypertension: Secondary | ICD-10-CM | POA: Diagnosis present

## 2018-04-29 DIAGNOSIS — I499 Cardiac arrhythmia, unspecified: Secondary | ICD-10-CM | POA: Diagnosis not present

## 2018-04-29 DIAGNOSIS — N138 Other obstructive and reflux uropathy: Secondary | ICD-10-CM | POA: Diagnosis not present

## 2018-04-29 DIAGNOSIS — I4891 Unspecified atrial fibrillation: Secondary | ICD-10-CM | POA: Diagnosis not present

## 2018-04-29 DIAGNOSIS — R0689 Other abnormalities of breathing: Secondary | ICD-10-CM | POA: Diagnosis not present

## 2018-04-29 DIAGNOSIS — R0902 Hypoxemia: Secondary | ICD-10-CM | POA: Diagnosis not present

## 2018-04-29 DIAGNOSIS — I5030 Unspecified diastolic (congestive) heart failure: Secondary | ICD-10-CM | POA: Diagnosis present

## 2018-04-29 DIAGNOSIS — Z8249 Family history of ischemic heart disease and other diseases of the circulatory system: Secondary | ICD-10-CM

## 2018-04-29 LAB — CBC WITH DIFFERENTIAL/PLATELET
Abs Immature Granulocytes: 0.03 10*3/uL (ref 0.00–0.07)
Basophils Absolute: 0 10*3/uL (ref 0.0–0.1)
Basophils Relative: 0 %
Eosinophils Absolute: 0 10*3/uL (ref 0.0–0.5)
Eosinophils Relative: 0 %
HCT: 41.8 % (ref 39.0–52.0)
Hemoglobin: 14.2 g/dL (ref 13.0–17.0)
Immature Granulocytes: 0 %
Lymphocytes Relative: 9 %
Lymphs Abs: 0.8 10*3/uL (ref 0.7–4.0)
MCH: 32.8 pg (ref 26.0–34.0)
MCHC: 34 g/dL (ref 30.0–36.0)
MCV: 96.5 fL (ref 80.0–100.0)
Monocytes Absolute: 0.7 10*3/uL (ref 0.1–1.0)
Monocytes Relative: 7 %
Neutro Abs: 7.9 10*3/uL — ABNORMAL HIGH (ref 1.7–7.7)
Neutrophils Relative %: 84 %
Platelets: 156 10*3/uL (ref 150–400)
RBC: 4.33 MIL/uL (ref 4.22–5.81)
RDW: 13.2 % (ref 11.5–15.5)
WBC: 9.5 10*3/uL (ref 4.0–10.5)
nRBC: 0 % (ref 0.0–0.2)

## 2018-04-29 LAB — COMPREHENSIVE METABOLIC PANEL
ALT: 29 U/L (ref 0–44)
AST: 56 U/L — ABNORMAL HIGH (ref 15–41)
Albumin: 3.6 g/dL (ref 3.5–5.0)
Alkaline Phosphatase: 89 U/L (ref 38–126)
Anion gap: 13 (ref 5–15)
BUN: 13 mg/dL (ref 8–23)
CO2: 21 mmol/L — ABNORMAL LOW (ref 22–32)
Calcium: 9 mg/dL (ref 8.9–10.3)
Chloride: 99 mmol/L (ref 98–111)
Creatinine, Ser: 1.38 mg/dL — ABNORMAL HIGH (ref 0.61–1.24)
GFR calc Af Amer: >60 mL/min (ref >60)
GFR calc non Af Amer: 52 mL/min — ABNORMAL LOW (ref >60)
Glucose, Bld: 301 mg/dL — ABNORMAL HIGH (ref 70–99)
Potassium: 4.4 mmol/L (ref 3.5–5.1)
Sodium: 133 mmol/L — ABNORMAL LOW (ref 135–145)
Total Bilirubin: 1.2 mg/dL (ref 0.3–1.2)
Total Protein: 7.7 g/dL (ref 6.5–8.1)

## 2018-04-29 LAB — INFLUENZA PANEL BY PCR (TYPE A & B)
Influenza A By PCR: NEGATIVE
Influenza B By PCR: NEGATIVE

## 2018-04-29 LAB — LACTIC ACID, PLASMA: Lactic Acid, Venous: 3.8 mmol/L (ref 0.5–1.9)

## 2018-04-29 MED ORDER — METRONIDAZOLE IN NACL 5-0.79 MG/ML-% IV SOLN
500.0000 mg | Freq: Once | INTRAVENOUS | Status: AC
  Administered 2018-04-29: 500 mg via INTRAVENOUS
  Filled 2018-04-29: qty 100

## 2018-04-29 MED ORDER — ACETAMINOPHEN 500 MG PO TABS
1000.0000 mg | ORAL_TABLET | Freq: Once | ORAL | Status: AC
  Administered 2018-04-29: 1000 mg via ORAL
  Filled 2018-04-29: qty 2

## 2018-04-29 MED ORDER — SODIUM CHLORIDE 0.9 % IV BOLUS
1000.0000 mL | Freq: Once | INTRAVENOUS | Status: AC
  Administered 2018-04-29: 1000 mL via INTRAVENOUS

## 2018-04-29 MED ORDER — SODIUM CHLORIDE 0.9 % IV SOLN
2.0000 g | Freq: Once | INTRAVENOUS | Status: AC
  Administered 2018-04-29: 2 g via INTRAVENOUS
  Filled 2018-04-29: qty 2

## 2018-04-29 MED ORDER — VANCOMYCIN HCL IN DEXTROSE 1-5 GM/200ML-% IV SOLN
1000.0000 mg | Freq: Once | INTRAVENOUS | Status: AC
  Administered 2018-04-29: 1000 mg via INTRAVENOUS
  Filled 2018-04-29: qty 200

## 2018-04-29 NOTE — ED Provider Notes (Addendum)
[facility] Emergency Department Provider Note    ____________________________________________   I have reviewed the triage vital signs and the nursing notes.   HISTORY  Chief Complaint Weakness  History limited by: Not Limited   HPI Christian Berg is a 68 y.o. male who presents to the emergency department today because of concern for weakness. The patient stated that he felt fine earlier in the day. This evening however started noticing he was weak. Went to the bathroom and then could not get himself off of the toilet when he went to stand up. The patient states he has had some fever today as well as some shortness of breath. He denies any nausea or vomiting. States that he has been staying at home and has had his wife go out of the house.    Records reviewed. Per medical record review patient has a history of afib  Past Medical History:  Diagnosis Date  . A-fib ([place])   . CHF (congestive heart failure) ([place])   . Degenerative disc disease, lumbar    s/p injury  . Diabetes mellitus without complication ([place])   . Disseminated superficial actinic porokeratosis   . Dysrhythmia    A-FIB, palpatations  . Hypercholesteremia   . Hypertension   . Kidney stones   . Peripheral vascular disease ([place])    Carotid stenosis  . Presence of permanent cardiac pacemaker    Inactive  . TIA (transient ischemic attack) 2011   No deficits    Patient Active Problem List   Diagnosis Date Noted  . 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 NASH (nonalcoholic steatohepatitis) 10/22/2015  . Ischemic embolic stroke ([place]) 29/51/8841  . Peripheral vascular disease ([place]) 12/15/2014  . Morbid (severe) obesity due to excess calories ([place]) 11/22/2014  . VFD (visual field defect) 11/22/2014  . History of TIA (transient ischemic attack) 10/31/2014  . Kidney stones 07/24/2014  . Other cirrhosis of liver ([place]) 07/24/2014  . DSAP (disseminated superficial actinic porokeratosis) 07/17/2014  .  Asthma 05/31/2014  . Chronic diastolic heart failure ([place]) 05/31/2014  . Diabetes mellitus ([place]) 05/31/2014  . Hypercholesteremia 05/31/2014  . B12 deficiency 05/31/2014  . Carotid artery narrowing 02/15/2014  . Bilateral carotid artery stenosis 02/15/2014  . Chronic atrial fibrillation 10/24/2013  . Benign prostatic hyperplasia with urinary obstruction 09/06/2012  . Heart disease 05/29/2009  . Benign essential HTN 09/29/2008  . Narrowing of intervertebral disc space 01/11/2007    Past Surgical History:  Procedure Laterality Date  . APPENDECTOMY  2004  . CARDIAC CATHETERIZATION    . CARDIAC PACEMAKER PLACEMENT  2005  . CATARACT EXTRACTION W/PHACO Right 06/14/2014   Procedure: CATARACT EXTRACTION PHACO AND INTRAOCULAR LENS PLACEMENT (IOC);  Surgeon: Leandrew Koyanagi, MD;  Location: [facility];  Service: Ophthalmology;  Laterality: Right;  . CYSTOSCOPY W/ RETROGRADES Bilateral 08/09/2014   Procedure: CYSTOSCOPY WITH RETROGRADE PYELOGRAM;  Surgeon: Hollice Espy, MD;  Location: [facility];  Service: Urology;  Laterality: Bilateral;  . INTUBATION-ENDOTRACHEAL WITH TRACHEOSTOMY STANDBY  03/30/2017   Procedure: INTUBATION-ENDOTRACHEAL WITH TRACHEOSTOMY STANDBY;  Surgeon: Carloyn Manner, MD;  Location: [facility];  Service: ENT;;    Prior to Admission medications   Medication Sig Start Date End Date Taking? Authorizing Provider  aspirin EC 81 MG tablet Take 81 mg by mouth every evening.     [provider]  doxycycline (VIBRA-TABS) 100 MG tablet Take 1 tablet (100 mg total) by mouth 2 (two) times daily. 01/22/18   [name], PA  furosemide (LASIX) 20 MG tablet  Take 1 tablet (20 mg total) by mouth daily. 01/22/18   [name], PA  glipiZIDE (GLUCOTROL) 10 MG tablet TAKE 1 TABLET BY MOUTH TWICE DAILY. TAKE 30 MINUTES BEFORE A MEAL. 03/25/18   [name], PA  lansoprazole (PREVACID) 15 MG capsule Take 15-30 mg by mouth daily as needed. For heartburn/reflux     [provider]  lisinopril (PRINIVIL,ZESTRIL) 20 MG tablet TAKE 1 TABLET BY MOUTH ONCE DAILY 01/08/18   [name], PA  lovastatin (MEVACOR) 10 MG tablet TAKE 2 TABLETS BY MOUTH ONCE DAILY 12/23/17   [name], PA  metFORMIN (GLUCOPHAGE) 850 MG tablet TAKE 1 TABLET BY MOUTH TWICE DAILY WITH MEALS 02/22/18   [name], PA  metoprolol tartrate (LOPRESSOR) 100 MG tablet Take 1 tablet (100 mg total) by mouth at bedtime. Patient taking differently: Take 200 mg by mouth 2 (two) times daily.  10/24/16   Demetrios Loll, MD  nystatin cream (MYCOSTATIN) Apply 1 application topically 2 (two) times daily. Patient taking differently: Apply 1 application topically 2 (two) times daily as needed for dry skin.  07/17/14   Zara Council A, PA-C  traMADol-acetaminophen (ULTRACET) 37.5-325 MG tablet Take 2 tablets by mouth 4 (four) times daily. 04/09/17   Henreitta Leber, MD  warfarin (COUMADIN) 5 MG tablet Take 1 tablet (5 mg total) by mouth daily. Patient taking differently: Take 7.5 mg by mouth daily.  04/09/17   Henreitta Leber, MD  warfarin (COUMADIN) 7.5 MG tablet TAKE 1 TABLET BY MOUTH ONCE DAILY 09/15/17   [name], PA    Allergies Clindamycin/lincomycin; Sulfa antibiotics; and Sulfasalazine  Family History  Adopted: Yes  Problem Relation Age of Onset  . Heart disease Mother   . Fibromyalgia Sister   . Hypertension Daughter   . Migraines Daughter   . Kidney disease Neg Hx   . Prostate cancer Neg Hx   . Bladder Cancer Neg Hx     Social History Social History   Tobacco Use  . Smoking status: Never Smoker  . Smokeless tobacco: Never Used  Substance Use Topics  . Alcohol use: No    Alcohol/week: 0.0 standard drinks  . Drug use: No    Review of Systems Constitutional: Positive for fever.  Eyes: No visual changes. ENT: No sore throat. Cardiovascular: Denies chest pain. Respiratory: Positive for shortness of breath. Gastrointestinal: No abdominal pain.  No  nausea, no vomiting.  No diarrhea.   Genitourinary: Negative for dysuria. Musculoskeletal: Negative for back pain. Skin: Negative for rash. Neurological: Negative for headaches, focal weakness or numbness.  ____________________________________________   PHYSICAL EXAM:  VITAL SIGNS: ED Triage Vitals  Enc Vitals Group     BP 04/29/18 2122 133/90     Pulse Rate 04/29/18 2122 (!) 111     Resp 04/29/18 2122 (!) 24     Temp 04/29/18 2124 (!) 101.9 F (38.8 C)     Temp Source 04/29/18 2124 Oral     SpO2 04/29/18 2122 98 %     Weight 04/29/18 2127 266 lb (120.7 kg)     Height 04/29/18 2127 5' 8"  (1.727 m)     Head Circumference --      Peak Flow --      Pain Score 04/29/18 2126 8   Constitutional: Alert and oriented.  Eyes: Conjunctivae are normal.  ENT      Head: Normocephalic and atraumatic.      Nose: No congestion/rhinnorhea.      Mouth/Throat: Mucous membranes are moist.  Neck: No stridor. Hematological/Lymphatic/Immunilogical: No cervical lymphadenopathy. Cardiovascular: Tachycardic, irregularly irregular rhythm.  Respiratory: Tachypnea.  Gastrointestinal: Soft and non tender. No rebound. No guarding.  Genitourinary: Deferred Musculoskeletal: Normal range of motion in all extremities. No lower extremity edema. Neurologic:  Normal speech and language. No gross focal neurologic deficits are appreciated.  Skin:  Skin is warm, dry and intact. No rash noted. Psychiatric: Mood and affect are normal. Speech and behavior are normal. Patient exhibits appropriate insight and judgment.  ____________________________________________    LABS (pertinent positives/negatives)  Lactic 3.8 CBC wbc 9.5, hgb 14.2, plt 156 CMP na 133, k 4.4, glu 301, cr 1.38  ____________________________________________   EKG  I, Nance Pear, attending physician, personally viewed and interpreted this EKG  EKG Time: 2129 Rate: 124 Rhythm: atrial fibrillation with ventricular  bigemony Axis: normal Intervals: qtc 463 QRS: narrow ST changes: no st elevation Impression: abmormal ekg   ____________________________________________    RADIOLOGY  CXR Cardiomegaly with mild vascular congestion  ____________________________________________   PROCEDURES  Procedures  CRITICAL CARE Performed by: Nance Pear   Total critical care time: 30 minutes  Critical care time was exclusive of separately billable procedures and treating other patients.  Critical care was necessary to treat or prevent imminent or life-threatening deterioration.  Critical care was time spent personally by me on the following activities: development of treatment plan with patient and/or surrogate as well as nursing, discussions with consultants, evaluation of patient's response to treatment, examination of patient, obtaining history from patient or surrogate, ordering and performing treatments and interventions, ordering and review of laboratory studies, ordering and review of radiographic studies, pulse oximetry and re-evaluation of patient's condition.  ____________________________________________   INITIAL IMPRESSION / ASSESSMENT AND PLAN / ED COURSE  Pertinent labs & imaging results that were available during my care of the patient were reviewed by me and considered in my medical decision making (see chart for details).   Patient presented to the emergency department today because of concerns for weakness.  On exam patient was noted be febrile and tachycardic.  I do have concerns for bad infection.  Chest x-ray was negative for pneumonia.  Patient's blood work showed elevated lactic acidosis again pointing to infection.  No clear source of infection so patient was given broad-spectrum IV antibiotics.  Will plan on admission to the hospital service. ____________________________________________   FINAL CLINICAL IMPRESSION(S) / ED DIAGNOSES  Final diagnoses:  Sepsis, due to  unspecified organism, unspecified whether acute organ dysfunction present ([place])  Fever, unspecified fever cause     Note: This dictation was prepared with Dragon dictation. Any transcriptional errors that result from this process are unintentional     Nance Pear, MD 04/29/18 0786    Nance Pear, MD 05/06/18 1320

## 2018-04-29 NOTE — Progress Notes (Signed)
CODE SEPSIS - PHARMACY COMMUNICATION  **Broad Spectrum Antibiotics should be administered within 1 hour of Sepsis diagnosis**  Time Code Sepsis Called/Page Received: @ 2132  Antibiotics Ordered: Cefepime, Vancomycin and Flagyl   Time of 1st antibiotic administration: @ 2216  Additional action taken by pharmacy: [facility] ED @ 2150 about No Abx ordered for code sepsis  If necessary, Name of Provider/Nurse Contacted: Maudie Mercury, RN  Pernell Dupre, PharmD, BCPS Clinical Pharmacist 04/29/2018 10:22 PM

## 2018-04-29 NOTE — H&P (Signed)
[facility] [place] NAME: Christian Berg    MR#:  [id number]  DATE OF BIRTH:  07/06/1950  DATE OF ADMISSION:  04/29/2018  PRIMARY CARE PHYSICIAN: [name], PA   REQUESTING/REFERRING PHYSICIAN: Archie Balboa, MD  CHIEF COMPLAINT:   Chief Complaint  Patient presents with  . Sore Throat  . Fever    HISTORY OF PRESENT ILLNESS:  Christian Berg  is a 68 y.o. male who presents with chief complaint as above.  Patient presents to the ED with a complaint of greater than 24 hours sore throat and progressive weakness.  He developed fever today.  He came to the hospital for evaluation.  Here his work-up is consistent with sepsis, though source was not initially elucidated in the ED.  Once urine studies returned he was nitrite positive.  Hospitalist were called for admission.  Of note, given his fever and sore throat for novel coronavirus test was sent from the ED.  PAST MEDICAL HISTORY:   Past Medical History:  Diagnosis Date  . A-fib ([place])   . CHF (congestive heart failure) ([place])   . Degenerative disc disease, lumbar    s/p injury  . Diabetes mellitus without complication ([place])   . Disseminated superficial actinic porokeratosis   . Dysrhythmia    A-FIB, palpatations  . Hypercholesteremia   . Hypertension   . Kidney stones   . Peripheral vascular disease ([place])    Carotid stenosis  . Presence of permanent cardiac pacemaker    Inactive  . TIA (transient ischemic attack) 2011   No deficits     PAST SURGICAL HISTORY:   Past Surgical History:  Procedure Laterality Date  . APPENDECTOMY  2004  . CARDIAC CATHETERIZATION    . CARDIAC PACEMAKER PLACEMENT  2005  . CATARACT EXTRACTION W/PHACO Right 06/14/2014   Procedure: CATARACT EXTRACTION PHACO AND INTRAOCULAR LENS PLACEMENT (IOC);  Surgeon: Leandrew Koyanagi, MD;  Location: [facility];  Service: Ophthalmology;  Laterality: Right;  . CYSTOSCOPY W/ RETROGRADES Bilateral  08/09/2014   Procedure: CYSTOSCOPY WITH RETROGRADE PYELOGRAM;  Surgeon: Hollice Espy, MD;  Location: [facility];  Service: Urology;  Laterality: Bilateral;  . INTUBATION-ENDOTRACHEAL WITH TRACHEOSTOMY STANDBY  03/30/2017   Procedure: INTUBATION-ENDOTRACHEAL WITH TRACHEOSTOMY STANDBY;  Surgeon: Carloyn Manner, MD;  Location: [facility];  Service: ENT;;     SOCIAL HISTORY:   Social History   Tobacco Use  . Smoking status: Never Smoker  . Smokeless tobacco: Never Used  Substance Use Topics  . Alcohol use: No    Alcohol/week: 0.0 standard drinks     FAMILY HISTORY:   Family History  Adopted: Yes  Problem Relation Age of Onset  . Heart disease Mother   . Fibromyalgia Sister   . Hypertension Daughter   . Migraines Daughter   . Kidney disease Neg Hx   . Prostate cancer Neg Hx   . Bladder Cancer Neg Hx      DRUG ALLERGIES:   Allergies  Allergen Reactions  . Clindamycin/Lincomycin     Chest discomfort  . Sulfa Antibiotics Hives  . Sulfasalazine Hives    MEDICATIONS AT HOME:   Prior to Admission medications   Medication Sig Start Date End Date Taking? Authorizing Provider  aspirin EC 81 MG tablet Take 81 mg by mouth every evening.     [provider]  doxycycline (VIBRA-TABS) 100 MG tablet Take 1 tablet (100 mg total) by mouth 2 (two) times daily. 01/22/18   [name], [place]  furosemide (LASIX) 20 MG tablet Take 1 tablet (20 mg total) by mouth daily. 01/22/18   [name], PA  glipiZIDE (GLUCOTROL) 10 MG tablet TAKE 1 TABLET BY MOUTH TWICE DAILY. TAKE 30 MINUTES BEFORE A MEAL. 03/25/18   [name], PA  lansoprazole (PREVACID) 15 MG capsule Take 15-30 mg by mouth daily as needed. For heartburn/reflux    [provider]  lisinopril (PRINIVIL,ZESTRIL) 20 MG tablet TAKE 1 TABLET BY MOUTH ONCE DAILY 01/08/18   [name], PA  lovastatin (MEVACOR) 10 MG tablet TAKE 2 TABLETS BY MOUTH ONCE DAILY 12/23/17   [name], PA  metFORMIN  (GLUCOPHAGE) 850 MG tablet TAKE 1 TABLET BY MOUTH TWICE DAILY WITH MEALS 02/22/18   [name], PA  metoprolol tartrate (LOPRESSOR) 100 MG tablet Take 1 tablet (100 mg total) by mouth at bedtime. Patient taking differently: Take 200 mg by mouth 2 (two) times daily.  10/24/16   Demetrios Loll, MD  nystatin cream (MYCOSTATIN) Apply 1 application topically 2 (two) times daily. Patient taking differently: Apply 1 application topically 2 (two) times daily as needed for dry skin.  07/17/14   Zara Council A, PA-C  traMADol-acetaminophen (ULTRACET) 37.5-325 MG tablet Take 2 tablets by mouth 4 (four) times daily. 04/09/17   Henreitta Leber, MD  warfarin (COUMADIN) 5 MG tablet Take 1 tablet (5 mg total) by mouth daily. Patient taking differently: Take 7.5 mg by mouth daily.  04/09/17   Henreitta Leber, MD  warfarin (COUMADIN) 7.5 MG tablet TAKE 1 TABLET BY MOUTH ONCE DAILY 09/15/17   [name], PA    REVIEW OF SYSTEMS:  Review of Systems  Constitutional: Positive for fever and malaise/fatigue. Negative for chills and weight loss.  HENT: Positive for sore throat. Negative for ear pain, hearing loss and tinnitus.   Eyes: Negative for blurred vision, double vision, pain and redness.  Respiratory: Negative for cough, hemoptysis and shortness of breath.   Cardiovascular: Negative for chest pain, palpitations, orthopnea and leg swelling.  Gastrointestinal: Negative for abdominal pain, constipation, diarrhea, nausea and vomiting.  Genitourinary: Negative for dysuria, frequency and hematuria.  Musculoskeletal: Negative for back pain, joint pain and neck pain.  Skin:       No acne, rash, or lesions  Neurological: Positive for weakness. Negative for dizziness, tremors and focal weakness.  Endo/Heme/Allergies: Negative for polydipsia. Does not bruise/bleed easily.  Psychiatric/Behavioral: Negative for depression. The patient is not nervous/anxious and does not have insomnia.      VITAL SIGNS:    Vitals:   04/29/18 2134 04/29/18 2135 04/29/18 2136 04/29/18 2137  BP:      Pulse: (!) 110 (!) 115 (!) 122 (!) 125  Resp: (!) 24 19 (!) 25 20  Temp:      TempSrc:      SpO2: 97% 96% 97% 97%  Weight:      Height:       Wt Readings from Last 3 Encounters:  04/29/18 120.7 kg  01/22/18 132 kg  01/22/18 132 kg    PHYSICAL EXAMINATION:  Physical Exam  Vitals reviewed. Constitutional: He is oriented to person, place, and time. He appears well-developed and well-nourished. No distress.  HENT:  Head: Normocephalic and atraumatic.  Dry mucous membranes  Eyes: Pupils are equal, round, and reactive to light. Conjunctivae and EOM are normal. No scleral icterus.  Neck: Normal range of motion. Neck supple. No JVD present. No thyromegaly present.  Cardiovascular: Intact distal pulses. Exam reveals no gallop and no friction rub.  No murmur heard. Tachycardic, irregular rhythm  Respiratory: Effort normal and breath sounds normal. No respiratory distress. He has no wheezes. He has no rales.  GI: Soft. Bowel sounds are normal. He exhibits no distension. There is no abdominal tenderness.  Musculoskeletal: Normal range of motion.        General: No edema.     Comments: No arthritis, no gout  Lymphadenopathy:    He has no cervical adenopathy.  Neurological: He is alert and oriented to person, place, and time. No cranial nerve deficit.  No dysarthria, no aphasia  Skin: Skin is warm and dry. No rash noted. No erythema.  Psychiatric: He has a normal mood and affect. His behavior is normal. Judgment and thought content normal.    LABORATORY PANEL:   CBC Recent Labs  Lab 04/29/18 2127  WBC 9.5  HGB 14.2  HCT 41.8  PLT 156   ------------------------------------------------------------------------------------------------------------------  Chemistries  Recent Labs  Lab 04/29/18 2127  NA 133*  K 4.4  CL 99  CO2 21*  GLUCOSE 301*  BUN 13  CREATININE 1.38*  CALCIUM 9.0  AST 56*   ALT 29  ALKPHOS 89  BILITOT 1.2   ------------------------------------------------------------------------------------------------------------------  Cardiac Enzymes No results for input(s): TROPONINI in the last 168 hours. ------------------------------------------------------------------------------------------------------------------  RADIOLOGY:  Dg Chest Portable 1 View  Result Date: 04/29/2018 CLINICAL DATA:  68 year old male with shortness of breath and fever. EXAM: PORTABLE CHEST 1 VIEW COMPARISON:  Chest radiograph dated 01/28/2018 FINDINGS: There is stable cardiomegaly with mild vascular congestion. No significant edema. No focal consolidation pleural effusion or pneumothorax. Left pectoral pacemaker device. No acute osseous pathology. Partially visualized left femoral head fixation hardware. IMPRESSION: Cardiomegaly with mild vascular congestion. No focal consolidation. Electronically Signed   By: Anner Crete M.D.   On: 04/29/2018 22:15    EKG:   Orders placed or performed during the hospital encounter of 04/29/18  . ED EKG 12-Lead  . ED EKG 12-Lead  . EKG 12-Lead  . EKG 12-Lead    IMPRESSION AND PLAN:  Principal Problem:   Sepsis ([place]) -due to his UTI, lactic acid was elevated in the ED and IV fluids were ordered and we will trend his lactic acid until within normal limits, IV antibiotics started, blood cultures sent, urine culture ordered, blood pressure has been stable Active Problems:   Atrial fibrillation with RVR ([place]) -give his home dose nodal blocking agent, IV fluids, continue anticoagulation.  If his heart rate does not correct with his home dose blocking agents and fluid hydration, would then consider cardiology consult   UTI (urinary tract infection) -IV antibiotics as above, urine culture ordered   Suspected Covid-19 Virus Infection -this was initially suspected given his fever and initially lack of clear source for his SIRS.  It seems more likely now that  this is a more straightforward sepsis from a UTI, though we will continue precautions until this test results   Acute on chronic renal failure (HCC) -IV fluids as above, avoid nephrotoxins   Chronic diastolic heart failure (HCC) -gentle fluid administration, continue home meds   Diabetes mellitus (HCC) -sliding scale insulin coverage   Benign essential HTN -home dose antihypertensives   Benign prostatic hyperplasia with urinary obstruction -patient required Foley placement in order to get urine sample   Hypercholesteremia -dose antilipid  Chart review performed and case discussed with ED provider. Labs, imaging and/or ECG reviewed by provider and discussed with patient/family. Management plans discussed with the patient and/or family.  DVT PROPHYLAXIS:  Systemic anticoagulation  GI PROPHYLAXIS:  PPI   ADMISSION STATUS: Inpatient     CODE STATUS: Full Code Status History    Date Active Date Inactive Code Status Order ID Comments User Context   03/30/2017 0924 04/10/2017 1828 Full Code [id number]  Carloyn Manner, MD Inpatient   10/22/2016 1340 10/24/2016 1709 Full Code [id number]  Gladstone Lighter, MD Inpatient   09/16/2015 0640 09/19/2015 1644 Full Code [id number]  Donnie Mesa, MD Inpatient   11/11/2014 2256 11/12/2014 1510 Full Code [id number]  Lance Coon, MD Inpatient   10/25/2014 0923 10/26/2014 0328 Full Code [id number]  Sabino Dick, MD HOV   08/07/2014 1807 08/09/2014 1927 Full Code [id number]  Epifanio Lesches, MD ED    Advance Directive Documentation     Most Recent Value  Type of Advance Directive  Healthcare Power of Attorney, Living will  Pre-existing out of facility DNR order (yellow form or pink MOST form)  -  "MOST" Form in Place?  -      TOTAL TIME TAKING CARE OF THIS PATIENT: 45 minutes.   Ethlyn Daniels 04/29/2018, 11:09 PM  [facility]  Office  [phone]  CC: Primary care physician; [name], PA  Note:  This document was prepared  using Dragon voice recognition software and may include unintentional dictation errors.

## 2018-04-29 NOTE — ED Triage Notes (Signed)
Patient arrived from home by [facility]. Per EMS patient started having a sore throat and then started running a fever today along with sore throat. Patient called EMS due to not being able to ambulate. EMS states patient had temp of 101.1 temp oral and HR 120-140 in afib. Patient has history of Afib, HNT, diabetes CBG per EMS 314. Per patient he has not been around anyone sick nor has he left his house. Patient denies chest pain.

## 2018-04-30 LAB — BLOOD CULTURE ID PANEL (REFLEXED)

## 2018-04-30 LAB — CBC WITH DIFFERENTIAL/PLATELET
Abs Immature Granulocytes: 0.05 10*3/uL (ref 0.00–0.07)
Basophils Absolute: 0 10*3/uL (ref 0.0–0.1)
Basophils Relative: 0 %
Eosinophils Absolute: 0.1 10*3/uL (ref 0.0–0.5)
Eosinophils Relative: 1 %
HCT: 39.6 % (ref 39.0–52.0)
Hemoglobin: 13.4 g/dL (ref 13.0–17.0)
Immature Granulocytes: 1 %
Lymphocytes Relative: 9 %
Lymphs Abs: 1 10*3/uL (ref 0.7–4.0)
MCH: 33 pg (ref 26.0–34.0)
MCHC: 33.8 g/dL (ref 30.0–36.0)
MCV: 97.5 fL (ref 80.0–100.0)
Monocytes Absolute: 0.7 10*3/uL (ref 0.1–1.0)
Monocytes Relative: 6 %
Neutro Abs: 9 10*3/uL — ABNORMAL HIGH (ref 1.7–7.7)
Neutrophils Relative %: 83 %
Platelets: 146 10*3/uL — ABNORMAL LOW (ref 150–400)
RBC: 4.06 MIL/uL — ABNORMAL LOW (ref 4.22–5.81)
RDW: 13.2 % (ref 11.5–15.5)
WBC: 10.9 10*3/uL — ABNORMAL HIGH (ref 4.0–10.5)
nRBC: 0 % (ref 0.0–0.2)

## 2018-04-30 LAB — URINALYSIS, ROUTINE W REFLEX MICROSCOPIC
Bilirubin Urine: NEGATIVE
Glucose, UA: >=500 mg/dL — AB
Ketones, ur: 5 mg/dL — AB
Nitrite: POSITIVE — AB
Protein, ur: NEGATIVE mg/dL
Specific Gravity, Urine: 1.011 (ref 1.005–1.030)
Squamous Epithelial / HPF: NONE SEEN (ref 0–5)
pH: 5 (ref 5.0–8.0)

## 2018-04-30 LAB — COMPREHENSIVE METABOLIC PANEL
ALT: 26 U/L (ref 0–44)
AST: 47 U/L — ABNORMAL HIGH (ref 15–41)
Albumin: 3.1 g/dL — ABNORMAL LOW (ref 3.5–5.0)
Alkaline Phosphatase: 81 U/L (ref 38–126)
Anion gap: 13 (ref 5–15)
BUN: 12 mg/dL (ref 8–23)
CO2: 21 mmol/L — ABNORMAL LOW (ref 22–32)
Calcium: 8.7 mg/dL — ABNORMAL LOW (ref 8.9–10.3)
Chloride: 105 mmol/L (ref 98–111)
Creatinine, Ser: 1.34 mg/dL — ABNORMAL HIGH (ref 0.61–1.24)
GFR calc Af Amer: >60 mL/min (ref >60)
GFR calc non Af Amer: 54 mL/min — ABNORMAL LOW (ref >60)
Glucose, Bld: 281 mg/dL — ABNORMAL HIGH (ref 70–99)
Potassium: 4.1 mmol/L (ref 3.5–5.1)
Sodium: 139 mmol/L (ref 135–145)
Total Bilirubin: 1.4 mg/dL — ABNORMAL HIGH (ref 0.3–1.2)
Total Protein: 6.8 g/dL (ref 6.5–8.1)

## 2018-04-30 LAB — GLUCOSE, CAPILLARY
Glucose-Capillary: 168 mg/dL — ABNORMAL HIGH (ref 70–99)
Glucose-Capillary: 237 mg/dL — ABNORMAL HIGH (ref 70–99)
Glucose-Capillary: 248 mg/dL — ABNORMAL HIGH (ref 70–99)
Glucose-Capillary: 273 mg/dL — ABNORMAL HIGH (ref 70–99)
Glucose-Capillary: 274 mg/dL — ABNORMAL HIGH (ref 70–99)

## 2018-04-30 LAB — PROTIME-INR
INR: 3.9 — ABNORMAL HIGH (ref 0.8–1.2)
Prothrombin Time: 37.9 seconds — ABNORMAL HIGH (ref 11.4–15.2)

## 2018-04-30 LAB — APTT: aPTT: 59 seconds — ABNORMAL HIGH (ref 24–36)

## 2018-04-30 LAB — LACTIC ACID, PLASMA
Lactic Acid, Venous: 2.6 mmol/L (ref 0.5–1.9)
Lactic Acid, Venous: 4.6 mmol/L (ref 0.5–1.9)
Lactic Acid, Venous: 2.4 mmol/L (ref 0.5–1.9)
Lactic Acid, Venous: 2.6 mmol/L (ref 0.5–1.9)

## 2018-04-30 LAB — MRSA PCR SCREENING: MRSA by PCR: NEGATIVE

## 2018-04-30 MED ORDER — SODIUM CHLORIDE 0.9 % IV SOLN: INTRAVENOUS | Status: AC

## 2018-04-30 MED ORDER — ASPIRIN EC 81 MG PO TBEC
81.0000 mg | DELAYED_RELEASE_TABLET | Freq: Every evening | ORAL | Status: DC
  Administered 2018-04-30 – 2018-05-01 (×2): 81 mg via ORAL
  Filled 2018-04-30 (×2): qty 1

## 2018-04-30 MED ORDER — SODIUM CHLORIDE 0.9 % IV BOLUS
1000.0000 mL | Freq: Once | INTRAVENOUS | Status: AC
  Administered 2018-04-30: 1000 mL via INTRAVENOUS

## 2018-04-30 MED ORDER — VANCOMYCIN HCL 10 G IV SOLR
1500.0000 mg | Freq: Once | INTRAVENOUS | Status: AC
  Administered 2018-04-30: 1500 mg via INTRAVENOUS
  Filled 2018-04-30: qty 1500

## 2018-04-30 MED ORDER — SODIUM CHLORIDE 0.9 % IV SOLN
1.0000 g | INTRAVENOUS | Status: DC
  Administered 2018-04-30 – 2018-05-01 (×2): 1 g via INTRAVENOUS
  Filled 2018-04-30 (×2): qty 1

## 2018-04-30 MED ORDER — WARFARIN - PHARMACIST DOSING INPATIENT
Freq: Every day | Status: DC
  Administered 2018-04-30: 17:00:00

## 2018-04-30 MED ORDER — ONDANSETRON HCL 4 MG/2ML IJ SOLN: 4.0000 mg | Freq: Four times a day (QID) | INTRAMUSCULAR | Status: DC | PRN

## 2018-04-30 MED ORDER — INSULIN ASPART 100 UNIT/ML ~~LOC~~ SOLN
0.0000 [IU] | Freq: Every day | SUBCUTANEOUS | Status: DC
  Administered 2018-04-30: 3 [IU] via SUBCUTANEOUS
  Administered 2018-05-01: 5 [IU] via SUBCUTANEOUS
  Filled 2018-04-30 (×2): qty 1

## 2018-04-30 MED ORDER — PRAVASTATIN SODIUM 20 MG PO TABS
20.0000 mg | ORAL_TABLET | Freq: Every day | ORAL | Status: DC
  Administered 2018-04-30 – 2018-05-01 (×2): 20 mg via ORAL
  Filled 2018-04-30 (×2): qty 1

## 2018-04-30 MED ORDER — OXYCODONE-ACETAMINOPHEN 5-325 MG PO TABS
1.0000 | ORAL_TABLET | Freq: Four times a day (QID) | ORAL | Status: DC | PRN
  Administered 2018-04-30 – 2018-05-02 (×7): 1 via ORAL
  Filled 2018-04-30 (×7): qty 1

## 2018-04-30 MED ORDER — ONDANSETRON HCL 4 MG PO TABS: 4.0000 mg | ORAL_TABLET | Freq: Four times a day (QID) | ORAL | Status: DC | PRN

## 2018-04-30 MED ORDER — ACETAMINOPHEN 325 MG PO TABS
650.0000 mg | ORAL_TABLET | Freq: Four times a day (QID) | ORAL | Status: DC | PRN
  Administered 2018-04-30: 650 mg via ORAL
  Filled 2018-04-30: qty 2

## 2018-04-30 MED ORDER — IPRATROPIUM-ALBUTEROL 20-100 MCG/ACT IN AERS
1.0000 | INHALATION_SPRAY | Freq: Four times a day (QID) | RESPIRATORY_TRACT | Status: DC | PRN
  Filled 2018-04-30: qty 4

## 2018-04-30 MED ORDER — PANTOPRAZOLE SODIUM 40 MG PO TBEC
40.0000 mg | DELAYED_RELEASE_TABLET | Freq: Every day | ORAL | Status: DC
  Administered 2018-04-30 – 2018-05-02 (×3): 40 mg via ORAL
  Filled 2018-04-30 (×3): qty 1

## 2018-04-30 MED ORDER — FINASTERIDE 5 MG PO TABS
5.0000 mg | ORAL_TABLET | Freq: Every day | ORAL | Status: DC
  Administered 2018-04-30 – 2018-05-02 (×3): 5 mg via ORAL
  Filled 2018-04-30 (×3): qty 1

## 2018-04-30 MED ORDER — VANCOMYCIN HCL 10 G IV SOLR
1500.0000 mg | INTRAVENOUS | Status: DC
  Filled 2018-04-30: qty 1500

## 2018-04-30 MED ORDER — ACETAMINOPHEN 650 MG RE SUPP: 650.0000 mg | Freq: Four times a day (QID) | RECTAL | Status: DC | PRN

## 2018-04-30 MED ORDER — SODIUM CHLORIDE 0.9 % IV SOLN
2.0000 g | Freq: Three times a day (TID) | INTRAVENOUS | Status: DC
  Administered 2018-04-30: 2 g via INTRAVENOUS
  Filled 2018-04-30 (×3): qty 2

## 2018-04-30 MED ORDER — METOPROLOL TARTRATE 50 MG PO TABS
200.0000 mg | ORAL_TABLET | Freq: Two times a day (BID) | ORAL | Status: DC
  Administered 2018-04-30 – 2018-05-02 (×6): 200 mg via ORAL
  Filled 2018-04-30 (×6): qty 4

## 2018-04-30 MED ORDER — INSULIN ASPART 100 UNIT/ML ~~LOC~~ SOLN
0.0000 [IU] | Freq: Three times a day (TID) | SUBCUTANEOUS | Status: DC
  Administered 2018-04-30: 3 [IU] via SUBCUTANEOUS
  Administered 2018-04-30: 5 [IU] via SUBCUTANEOUS
  Administered 2018-05-01: 7 [IU] via SUBCUTANEOUS
  Administered 2018-05-01: 5 [IU] via SUBCUTANEOUS
  Administered 2018-05-01 – 2018-05-02 (×2): 7 [IU] via SUBCUTANEOUS
  Administered 2018-05-02: 3 [IU] via SUBCUTANEOUS
  Filled 2018-04-30 (×6): qty 1

## 2018-04-30 NOTE — Progress Notes (Addendum)
Pt lactic at 4.6. Notified Dr. Marcille Blanco and ordered to give a normal saline 1000 liter and run for 2 hours. Will continue to monitor.  Update 0420: Pt states that he have a pacemaker but was not working. Pt states that the pacemaker was not doing its supposed to do. Battery was empty as per pt, but according to his doctor will not change his battery or take pacemaker out. Will notify incoming shift. Will continue to monitor.

## 2018-04-30 NOTE — Progress Notes (Signed)
[facility] [place] NAME: Christian Berg    MR#:  [id number]  DATE OF BIRTH:  30-Aug-1950  SUBJECTIVE:  CHIEF COMPLAINT:   Chief Complaint  Patient presents with  . Sore Throat  . Fever   Came with fever, have urinary urgency and also suspect COVID 19. Feels better now, still have urinary symptoms. REVIEW OF SYSTEMS:  CONSTITUTIONAL: No fever, fatigue or weakness.  EYES: No blurred or double vision.  EARS, NOSE, AND THROAT: No tinnitus or ear pain.  RESPIRATORY: No cough, shortness of breath, wheezing or hemoptysis.  CARDIOVASCULAR: No chest pain, orthopnea, edema.  GASTROINTESTINAL: No nausea, vomiting, diarrhea or abdominal pain.  GENITOURINARY: No dysuria, hematuria.  ENDOCRINE: No polyuria, nocturia,  HEMATOLOGY: No anemia, easy bruising or bleeding SKIN: No rash or lesion. MUSCULOSKELETAL: No joint pain or arthritis.   NEUROLOGIC: No tingling, numbness, weakness.  PSYCHIATRY: No anxiety or depression.   ROS  DRUG ALLERGIES:   Allergies  Allergen Reactions  . Clindamycin/Lincomycin     Chest discomfort  . Sulfa Antibiotics Hives  . Sulfasalazine Hives    VITALS:  Blood pressure 126/73, pulse (!) 107, temperature 98.9 F (37.2 C), temperature source Oral, resp. rate 20, height 5' 8"  (1.727 m), weight 130 kg, SpO2 99 %.  PHYSICAL EXAMINATION:  GENERAL:  68 y.o.-year-old patient lying in the bed with no acute distress.  EYES: Pupils equal, round, reactive to light and accommodation. No scleral icterus. Extraocular muscles intact.  HEENT: Head atraumatic, normocephalic. Oropharynx and nasopharynx clear.  NECK:  Supple, no jugular venous distention. No thyroid enlargement, no tenderness.  LUNGS: Normal breath sounds bilaterally, no wheezing, rales,rhonchi or crepitation. No use of accessory muscles of respiration.  CARDIOVASCULAR: S1, S2 normal. No murmurs, rubs, or gallops.  ABDOMEN: Soft, nontender, nondistended. Bowel  sounds present. No organomegaly or mass.  EXTREMITIES: No pedal edema, cyanosis, or clubbing.  NEUROLOGIC: Cranial nerves II through XII are intact. Muscle strength 5/5 in all extremities. Sensation intact. Gait not checked.  PSYCHIATRIC: The patient is alert and oriented x 3.  SKIN: No obvious rash, lesion, or ulcer.   Physical Exam LABORATORY PANEL:   CBC Recent Labs  Lab 04/30/18 0147  WBC 10.9*  HGB 13.4  HCT 39.6  PLT 146*   ------------------------------------------------------------------------------------------------------------------  Chemistries  Recent Labs  Lab 04/30/18 0147  NA 139  K 4.1  CL 105  CO2 21*  GLUCOSE 281*  BUN 12  CREATININE 1.34*  CALCIUM 8.7*  AST 47*  ALT 26  ALKPHOS 81  BILITOT 1.4*   ------------------------------------------------------------------------------------------------------------------  Cardiac Enzymes No results for input(s): TROPONINI in the last 168 hours. ------------------------------------------------------------------------------------------------------------------  RADIOLOGY:  Dg Chest Portable 1 View  Result Date: 04/29/2018 CLINICAL DATA:  68 year old male with shortness of breath and fever. EXAM: PORTABLE CHEST 1 VIEW COMPARISON:  Chest radiograph dated 01/28/2018 FINDINGS: There is stable cardiomegaly with mild vascular congestion. No significant edema. No focal consolidation pleural effusion or pneumothorax. Left pectoral pacemaker device. No acute osseous pathology. Partially visualized left femoral head fixation hardware. IMPRESSION: Cardiomegaly with mild vascular congestion. No focal consolidation. Electronically Signed   By: Anner Crete M.D.   On: 04/29/2018 22:15    ASSESSMENT AND PLAN:   Principal Problem:   Sepsis ([place]) Active Problems:   Chronic diastolic heart failure (HCC)   Diabetes mellitus (HCC)   Atrial fibrillation with RVR (HCC)   Benign essential HTN   Hypercholesteremia   Benign  prostatic hyperplasia with urinary  obstruction   UTI (urinary tract infection)   Suspected Covid-19 Virus Infection   Acute on chronic renal failure (HCC)  *  Sepsis (HCC) -due to his UTI, rule out COVID 19 lactic acid was elevated in the ED and IV fluids were ordered and now lactic acidcame down, IV antibiotics started, blood cultures sent, urine culture ordered, blood pressure has been stable Bl cx- streptococci in one cx - likely contamination Awaited COVID 19 result.  *  Atrial fibrillation with RVR ([place]) -give his home dose nodal blocking agent, IV fluids, continue anticoagulation.    *  UTI (urinary tract infection) -IV antibiotics as above, urine culture ordered  *  Suspected Covid-19 Virus Infection -this was initially suspected given his fever and initially lack of clear source for his SIRS.  It seems more likely now that this is a more straightforward sepsis from a UTI, though we will continue precautions until this test results  *  Acute on chronic renal failure (HCC) -IV fluids as above, avoid nephrotoxins *  Chronic diastolic heart failure (HCC) -gentle fluid administration, continue home meds *  Diabetes mellitus (HCC) -sliding scale insulin coverage  * Benign essential HTN -home dose antihypertensives *  Benign prostatic hyperplasia with urinary obstruction - will remove foley and check for urinary retention issues.  *  Hypercholesteremia -dose antilipid   All the records are reviewed and case discussed with Care Management/Social Workerr. Management plans discussed with the patient, family and they are in agreement.  CODE STATUS: Full  TOTAL TIME TAKING CARE OF THIS PATIENT: 35 minutes.    POSSIBLE D/C IN 1-2 DAYS, DEPENDING ON CLINICAL CONDITION.   Vaughan Basta M.D on 04/30/2018   Between 7am to 6pm - Pager - [phone]  After 6pm go to [email or web address] - password [email or web address] [facility]  Office  [phone]  CC: Primary care  physician; [name], PA  Note: This dictation was prepared with Dragon dictation along with smaller phrase technology. Any transcriptional errors that result from this process are unintentional.

## 2018-04-30 NOTE — Consult Note (Signed)
Pharmacy Antibiotic Note  Christian Berg is a 68 y.o. male admitted on 04/29/2018 with sepsis.  Pharmacy has been consulted for Cefepime and Vancomycin dosing. Patient received vancomycin 1g IV and cefepime 2g IV x 1 dose in ED.   Plan: Will order an additional vancomycin 1565m IV x 1 dose for total of 25056mloading dose.  Start Vancomycin 1500 mg IV Q 24 hrs. Goal AUC 400-550. Expected AUC: 513 SCr used: 1.38  Start Cefepime 2g IV every 8 hours.    Height: 5' 8"  (172.7 cm) Weight: 286 lb 9.6 oz (130 kg) IBW/kg (Calculated) : 68.4  Temp (24hrs), Avg:100.5 F (38.1 C), Min:99.6 F (37.6 C), Max:101.9 F (38.8 C)  Recent Labs  Lab 04/29/18 2127  WBC 9.5  CREATININE 1.38*  LATICACIDVEN 2.6*  3.8*    Estimated Creatinine Clearance: 66.5 mL/min (A) (by C-G formula based on SCr of 1.38 mg/dL (H)).    Allergies  Allergen Reactions  . Clindamycin/Lincomycin     Chest discomfort  . Sulfa Antibiotics Hives  . Sulfasalazine Hives    Antimicrobials this admission: 4/3 vancomycin >>  4/3 cefepime  >>   Dose adjustments this admission:   Microbiology results: 4/2 BCx: pending 4/2 UCx: pending 4/2 COVID-19 swab: pending  4/3 MRSA PCR: pending  Thank you for allowing pharmacy to be a part of this patient's care.  ShPernell DuprePharmD, BCPS Clinical Pharmacist 04/30/2018 2:15 AM

## 2018-04-30 NOTE — ED Notes (Signed)
ED TO INPATIENT HANDOFF REPORT  ED Nurse Name and Phone #: Claiborne Billings [phone]  S Name/Age/Gender Christian Berg 68 y.o. male Room/Bed: ED11A/ED11A  Code Status   Code Status: Prior  Home/SNF/Other Home Patient oriented to: self, place, time and situation Is this baseline? Yes   Triage Complete: Triage complete  Chief Complaint Sore Throat/Weakness  Triage Note Patient arrived from home by [facility]. Per EMS patient started having a sore throat and then started running a fever today along with sore throat. Patient called EMS due to not being able to ambulate. EMS states patient had temp of 101.1 temp oral and HR 120-140 in afib. Patient has history of Afib, HNT, diabetes CBG per EMS 314. Per patient he has not been around anyone sick nor has he left his house. Patient denies chest pain.       Allergies Allergies  Allergen Reactions  . Clindamycin/Lincomycin     Chest discomfort  . Sulfa Antibiotics Hives  . Sulfasalazine Hives    Level of Care/Admitting Diagnosis ED Disposition    ED Disposition Condition [facility] Area: [place] [[id number]]  Level of Care: Telemetry [5]  Diagnosis: SIRS (systemic inflammatory response syndrome) [facility]) [[id number]]  Admitting Physician: Lance Coon [[id number]]  Attending Physician: Jannifer Franklin, DAVID [phone]  Estimated length of stay: past midnight tomorrow  Certification:: I certify this patient will need inpatient services for at least 2 midnights  Bed request comments: suspected COVID-19 infection, low risk, droplet and contact precautions  PT Class (Do Not Modify): Inpatient [101]  PT Acc Code (Do Not Modify): Private [1]       B Medical/Surgery History Past Medical History:  Diagnosis Date  . A-fib ([place])   . CHF (congestive heart failure) ([place])   . Degenerative disc disease, lumbar    s/p injury  . Diabetes mellitus without complication ([place])   . Disseminated superficial  actinic porokeratosis   . Dysrhythmia    A-FIB, palpatations  . Hypercholesteremia   . Hypertension   . Kidney stones   . Peripheral vascular disease ([place])    Carotid stenosis  . Presence of permanent cardiac pacemaker    Inactive  . TIA (transient ischemic attack) 2011   No deficits   Past Surgical History:  Procedure Laterality Date  . APPENDECTOMY  2004  . CARDIAC CATHETERIZATION    . CARDIAC PACEMAKER PLACEMENT  2005  . CATARACT EXTRACTION W/PHACO Right 06/14/2014   Procedure: CATARACT EXTRACTION PHACO AND INTRAOCULAR LENS PLACEMENT (IOC);  Surgeon: Leandrew Koyanagi, MD;  Location: [place];  Service: Ophthalmology;  Laterality: Right;  . CYSTOSCOPY W/ RETROGRADES Bilateral 08/09/2014   Procedure: CYSTOSCOPY WITH RETROGRADE PYELOGRAM;  Surgeon: Hollice Espy, MD;  Location: [facility];  Service: Urology;  Laterality: Bilateral;  . INTUBATION-ENDOTRACHEAL WITH TRACHEOSTOMY STANDBY  03/30/2017   Procedure: INTUBATION-ENDOTRACHEAL WITH TRACHEOSTOMY STANDBY;  Surgeon: Carloyn Manner, MD;  Location: [facility];  Service: ENT;;     A IV Location/Drains/Wounds Patient Lines/Drains/Airways Status   Active Line/Drains/Airways    Name:   Placement date:   Placement time:   Site:   Days:   Peripheral IV 04/29/18 Right Wrist   04/29/18    2111    Wrist   1   Peripheral IV 04/29/18 Left Antecubital   04/29/18    2135    Antecubital   1   Urethral Catheter Zach EDT Double-lumen;Non-latex 16 Fr.   04/29/18    2356    Double-lumen;Non-latex  1   Airway 7 mm   03/30/17    -     396   Pressure Injury 03/30/17 Deep Tissue Injury - Purple or maroon localized area of discolored intact skin or blood-filled blister due to damage of underlying soft tissue from pressure and/or shear.   03/30/17    1000     396          Intake/Output Last 24 hours No intake or output data in the 24 hours ending 04/30/18 0002  Labs/Imaging Results for orders placed or performed during the hospital  encounter of 04/29/18 (from the past 48 hour(s))  Lactic acid, plasma     Status: Abnormal   Collection Time: 04/29/18  9:27 PM  Result Value Ref Range   Lactic Acid, Venous 3.8 (HH) 0.5 - 1.9 mmol/L    Comment: CRITICAL RESULT CALLED TO, READ BACK BY AND VERIFIED WITH [facility] Kavari Parrillo AT 2200 04/29/2018 SMA Performed at [facility], [address] [place], [place] [address]   Comprehensive metabolic panel     Status: Abnormal   Collection Time: 04/29/18  9:27 PM  Result Value Ref Range   Sodium 133 (L) 135 - 145 mmol/L   Potassium 4.4 3.5 - 5.1 mmol/L   Chloride 99 98 - 111 mmol/L   CO2 21 (L) 22 - 32 mmol/L   Glucose, Bld 301 (H) 70 - 99 mg/dL   BUN 13 8 - 23 mg/dL   Creatinine, Ser 1.38 (H) 0.61 - 1.24 mg/dL   Calcium 9.0 8.9 - 10.3 mg/dL   Total Protein 7.7 6.5 - 8.1 g/dL   Albumin 3.6 3.5 - 5.0 g/dL   AST 56 (H) 15 - 41 U/L   ALT 29 0 - 44 U/L   Alkaline Phosphatase 89 38 - 126 U/L   Total Bilirubin 1.2 0.3 - 1.2 mg/dL   GFR calc non Af Amer 52 (L) >60 mL/min   GFR calc Af Amer >60 >60 mL/min   Anion gap 13 5 - 15    Comment: Performed at [facility], [address] [place], [place] [address]  CBC WITH DIFFERENTIAL     Status: Abnormal   Collection Time: 04/29/18  9:27 PM  Result Value Ref Range   WBC 9.5 4.0 - 10.5 K/uL   RBC 4.33 4.22 - 5.81 MIL/uL   Hemoglobin 14.2 13.0 - 17.0 g/dL   HCT 41.8 39.0 - 52.0 %   MCV 96.5 80.0 - 100.0 fL   MCH 32.8 26.0 - 34.0 pg   MCHC 34.0 30.0 - 36.0 g/dL   RDW 13.2 11.5 - 15.5 %   Platelets 156 150 - 400 K/uL   nRBC 0.0 0.0 - 0.2 %   Neutrophils Relative % 84 %   Neutro Abs 7.9 (H) 1.7 - 7.7 K/uL   Lymphocytes Relative 9 %   Lymphs Abs 0.8 0.7 - 4.0 K/uL   Monocytes Relative 7 %   Monocytes Absolute 0.7 0.1 - 1.0 K/uL   Eosinophils Relative 0 %   Eosinophils Absolute 0.0 0.0 - 0.5 K/uL   Basophils Relative 0 %   Basophils Absolute 0.0 0.0 - 0.1 K/uL   Immature Granulocytes 0 %   Abs Immature  Granulocytes 0.03 0.00 - 0.07 K/uL    Comment: Performed at [facility], [address] [place],  [address]  Influenza panel by PCR (type A & B)     Status: None   Collection Time: 04/29/18 10:22 PM  Result  Value Ref Range   Influenza A By PCR NEGATIVE NEGATIVE   Influenza B By PCR NEGATIVE NEGATIVE    Comment: (NOTE) The Xpert Xpress Flu assay is intended as an aid in the diagnosis of  influenza and should not be used as a sole basis for treatment.  This  assay is FDA approved for nasopharyngeal swab specimens only. Nasal  washings and aspirates are unacceptable for Xpert Xpress Flu testing. Performed at [facility] [address], [place], [place] [address]    Dg Chest Portable 1 View  Result Date: 04/29/2018 CLINICAL DATA:  68 year old male with shortness of breath and fever. EXAM: PORTABLE CHEST 1 VIEW COMPARISON:  Chest radiograph dated 01/28/2018 FINDINGS: There is stable cardiomegaly with mild vascular congestion. No significant edema. No focal consolidation pleural effusion or pneumothorax. Left pectoral pacemaker device. No acute osseous pathology. Partially visualized left femoral head fixation hardware. IMPRESSION: Cardiomegaly with mild vascular congestion. No focal consolidation. Electronically Signed   By: Anner Crete M.D.   On: 04/29/2018 22:15    Pending Labs Unresulted Labs (From admission, onward)    Start     Ordered   04/30/18 0000  Protime-INR  Once,   STAT     04/30/18 0000   04/30/18 0000  APTT  Once,   STAT     04/30/18 0000   04/29/18 2146  Novel Coronavirus, NAA (hospital order; send-out to ref lab)  (Novel Coronavirus, NAA [facility] Order; send-out to ref lab) with precautions panel)  Once,   STAT    Question Answer Comment  Current symptoms Fever and Shortness of breath   Excluded other viral illnesses Yes   Exposure Risk None      04/29/18 2146   04/29/18 2131  Lactic acid, plasma  Now then every 2 hours,   STAT      04/29/18 2134   04/29/18 2131  Blood Culture (routine x 2)  BLOOD CULTURE X 2,   STAT     04/29/18 2134   04/29/18 2131  Urinalysis, Routine w reflex microscopic  ONCE - STAT,   STAT     04/29/18 2134   Signed and Held  HIV antibody (Routine Testing)  Once,   R     Signed and Held   Signed and Held  Comprehensive metabolic panel  Tomorrow morning,   R     Signed and Held   Signed and Held  CBC with Differential/Platelet  Tomorrow morning,   R     Signed and Held          Vitals/Pain Today's Vitals   04/29/18 2328 04/29/18 2328 04/29/18 2328 04/29/18 2330  BP:    (!) 148/86  Pulse:    (!) 121  Resp:    (!) 27  Temp:   99.6 F (37.6 C)   TempSrc:   Oral   SpO2:    97%  Weight:      Height:      PainSc: 5  5       Isolation Precautions Droplet precaution  Medications Medications  acetaminophen (TYLENOL) tablet 1,000 mg (1,000 mg Oral Given 04/29/18 2212)  sodium chloride 0.9 % bolus 1,000 mL (0 mLs Intravenous Stopped 04/29/18 2328)  vancomycin (VANCOCIN) IVPB 1000 mg/200 mL premix (0 mg Intravenous Stopped 04/29/18 2356)  ceFEPIme (MAXIPIME) 2 g in sodium chloride 0.9 % 100 mL IVPB (0 g Intravenous Stopped 04/29/18 2247)  metroNIDAZOLE (FLAGYL) IVPB 500 mg (0 mg Intravenous Stopped 04/29/18 2319)  sodium  chloride 0.9 % bolus 1,000 mL (0 mLs Intravenous Stopped 04/29/18 2328)    Mobility walks with device High fall risk   Focused Assessments Cardiac Assessment Handoff:  Cardiac Rhythm: Atrial fibrillation Lab Results  Component Value Date   TROPONINI <0.03 04/03/2017   No results found for: DDIMER Does the Patient currently have chest pain? No     R Recommendations: See Admitting Provider Note  Report given to:   Additional Notes: A fib

## 2018-04-30 NOTE — Plan of Care (Signed)
  Problem: Education: Goal: Knowledge of General Education information will improve Description Including pain rating scale, medication(s)/side effects and non-pharmacologic comfort measures 04/30/2018 0647 by Liliane Channel, RN Outcome: Progressing 04/30/2018 0448 by Liliane Channel, RN Outcome: Progressing   Problem: Clinical Measurements: Goal: Will remain free from infection Outcome: Progressing   Problem: Safety: Goal: Ability to remain free from injury will improve 04/30/2018 0647 by Liliane Channel, RN Outcome: Progressing 04/30/2018 0448 by Liliane Channel, RN Outcome: Progressing

## 2018-04-30 NOTE — Plan of Care (Signed)

## 2018-04-30 NOTE — Consult Note (Signed)
ANTICOAGULATION CONSULT NOTE - Initial Consult  Pharmacy Consult for Warfarin Dosing and Monitoring  Indication: atrial fibrillation  Allergies  Allergen Reactions  . Clindamycin/Lincomycin     Chest discomfort  . Sulfa Antibiotics Hives  . Sulfasalazine Hives   Patient Measurements: Height: 5' 8"  (172.7 cm) Weight: 266 lb (120.7 kg) IBW/kg (Calculated) : 68.4  Vital Signs: Temp: 99.6 F (37.6 C) (04/02 2328) Temp Source: Oral (04/02 2328) BP: 147/75 (04/03 0000) Pulse Rate: 119 (04/03 0016)  Labs: Recent Labs    04/29/18 2127 04/29/18 2332  HGB 14.2  --   HCT 41.8  --   PLT 156  --   APTT  --  59*  LABPROT  --  37.9*  INR  --  3.9*  CREATININE 1.38*  --     Estimated Creatinine Clearance: 63.8 mL/min (A) (by C-G formula based on SCr of 1.38 mg/dL (H)).   Medical History: Past Medical History:  Diagnosis Date  . A-fib ([place])   . CHF (congestive heart failure) ([place])   . Degenerative disc disease, lumbar    s/p injury  . Diabetes mellitus without complication ([place])   . Disseminated superficial actinic porokeratosis   . Dysrhythmia    A-FIB, palpatations  . Hypercholesteremia   . Hypertension   . Kidney stones   . Peripheral vascular disease ([place])    Carotid stenosis  . Presence of permanent cardiac pacemaker    Inactive  . TIA (transient ischemic attack) 2011   No deficits    Assessment: Pharmacy consulted for warfarin dosing and monitoring for 68 yo male w/PMH of A. Fib. Patient was taking warfarin PTA. INR on admission supratherapeutic @  3.9.  Home Regimen: Warfarin 7.39m Daily   DATE INR DOSE 4/3 3.9 HELD  Goal of Therapy:  INR 2-3 Monitor platelets by anticoagulation protocol: Yes   Plan:  4/3 INR supratherapeutic @ 3.9. Warfarin will be held tonight.  Will recheck INR with AM labs on 4/4.   Patient is receiving antibiotics for sepsis. INRs will be ordered daily per protocol while on antibiotics.   Pharmacy will continue to monitor  INR and order warfarin when INR closer to therapeutic range.   SPernell Dupre PharmD, BCPS Clinical Pharmacist 04/30/2018 12:47 AM

## 2018-05-01 LAB — PROTIME-INR
INR: 2.8 — ABNORMAL HIGH (ref 0.8–1.2)
Prothrombin Time: 29 seconds — ABNORMAL HIGH (ref 11.4–15.2)

## 2018-05-01 LAB — URINE CULTURE: Culture: <10000 — AB

## 2018-05-01 LAB — GLUCOSE, CAPILLARY
Glucose-Capillary: 350 mg/dL — ABNORMAL HIGH (ref 70–99)
Glucose-Capillary: 307 mg/dL — ABNORMAL HIGH (ref 70–99)
Glucose-Capillary: 364 mg/dL — ABNORMAL HIGH (ref 70–99)

## 2018-05-01 LAB — HIV ANTIBODY (ROUTINE TESTING W REFLEX): HIV Screen 4th Generation wRfx: NONREACTIVE

## 2018-05-01 MED ORDER — SODIUM CHLORIDE 0.9 % IV SOLN
2.0000 g | INTRAVENOUS | Status: DC
  Filled 2018-05-01: qty 20

## 2018-05-01 MED ORDER — SODIUM CHLORIDE 0.9% FLUSH: 3.0000 mL | INTRAVENOUS | Status: DC | PRN

## 2018-05-01 MED ORDER — WARFARIN SODIUM 5 MG PO TABS
5.0000 mg | ORAL_TABLET | Freq: Every day | ORAL | Status: DC
  Administered 2018-05-01: 5 mg via ORAL
  Filled 2018-05-01: qty 1

## 2018-05-01 NOTE — Progress Notes (Signed)
[place] at [facility]                                                                                                                                                                                  Patient Demographics   Christian Berg, is a 68 y.o. male, DOB - 1950/08/01, ZES:[id number]  Admit date - 04/29/2018   Admitting Physician Lance Coon, MD  Outpatient Primary MD for the patient is [name], [place]   LOS - 2  Subjective:  Patient feeling well denies any complaints    Review of Systems:   CONSTITUTIONAL: No documented fever. No fatigue, weakness. No weight gain, no weight loss.  EYES: No blurry or double vision.  ENT: No tinnitus. No postnasal drip. No redness of the oropharynx.  RESPIRATORY: No cough, no wheeze, no hemoptysis. No dyspnea.  CARDIOVASCULAR: No chest pain. No orthopnea. No palpitations. No syncope.  GASTROINTESTINAL: No nausea, no vomiting or diarrhea. No abdominal pain. No melena or hematochezia.  GENITOURINARY: No dysuria or hematuria.  ENDOCRINE: No polyuria or nocturia. No heat or cold intolerance.  HEMATOLOGY: No anemia. No bruising. No bleeding.  INTEGUMENTARY: No rashes. No lesions.  MUSCULOSKELETAL: No arthritis. No swelling. No gout.  NEUROLOGIC: No numbness, tingling, or ataxia. No seizure-type activity.  PSYCHIATRIC: No anxiety. No insomnia. No ADD.    Vitals:   Vitals:   04/30/18 2057 05/01/18 0358 05/01/18 0359 05/01/18 0817  BP: (!) 154/95  135/69 (!) 154/92  Pulse: (!) 104  97 81  Resp: 20   20  Temp: 98.8 F (37.1 C)  97.9 F (36.6 C) 99.1 F (37.3 C)  TempSrc: Oral  Oral Oral  SpO2: 98%  98% 99%  Weight:  129.2 kg    Height:        Wt Readings from Last 3 Encounters:  05/01/18 129.2 kg  01/22/18 132 kg  01/22/18 132 kg     Intake/Output Summary (Last 24 hours) at 05/01/2018 1032 Last data filed at 04/30/2018 2059 Gross per 24 hour  Intake 143.4 ml  Output 2000 ml  Net -1856.6 ml     Physical Exam:   GENERAL: Pleasant-appearing in no apparent distress.  HEAD, EYES, EARS, NOSE AND THROAT: Atraumatic, normocephalic. Extraocular muscles are intact. Pupils equal and reactive to light. Sclerae anicteric. No conjunctival injection. No oro-pharyngeal erythema.  NECK: Supple. There is no jugular venous distention. No bruits, no lymphadenopathy, no thyromegaly.  HEART: Regular rate and rhythm,. No murmurs, no rubs, no clicks.  LUNGS: Clear to auscultation bilaterally. No rales or rhonchi. No wheezes.  ABDOMEN: Soft, flat, nontender, nondistended. Has good bowel sounds. No hepatosplenomegaly appreciated.  EXTREMITIES: No evidence  of any cyanosis, clubbing, or peripheral edema.  +2 pedal and radial pulses bilaterally.  NEUROLOGIC: The patient is alert, awake, and oriented x3 with no focal motor or sensory deficits appreciated bilaterally.  SKIN: Moist and warm with no rashes appreciated.  Psych: Not anxious, depressed LN: No inguinal LN enlargement    Antibiotics   Anti-infectives (From admission, onward)   Start     Dose/Rate Route Frequency Ordered Stop   05/01/18 0000  vancomycin (VANCOCIN) 1,500 mg in sodium chloride 0.9 % 500 mL IVPB  Status:  Discontinued     1,500 mg 250 mL/hr over 120 Minutes Intravenous Every 24 hours 04/30/18 0213 04/30/18 0848   04/30/18 1700  cefTRIAXone (ROCEPHIN) 1 g in sodium chloride 0.9 % 100 mL IVPB     1 g 200 mL/hr over 30 Minutes Intravenous Every 24 hours 04/30/18 1119     04/30/18 0600  ceFEPIme (MAXIPIME) 2 g in sodium chloride 0.9 % 100 mL IVPB  Status:  Discontinued     2 g 200 mL/hr over 30 Minutes Intravenous Every 8 hours 04/30/18 0147 04/30/18 1119   04/30/18 0200  vancomycin (VANCOCIN) 1,500 mg in sodium chloride 0.9 % 500 mL IVPB     1,500 mg 250 mL/hr over 120 Minutes Intravenous  Once 04/30/18 0146 04/30/18 0405   04/29/18 2200  vancomycin (VANCOCIN) IVPB 1000 mg/200 mL premix     1,000 mg 200 mL/hr over 60 Minutes  Intravenous  Once 04/29/18 2158 04/29/18 2356   04/29/18 2200  ceFEPIme (MAXIPIME) 2 g in sodium chloride 0.9 % 100 mL IVPB     2 g 200 mL/hr over 30 Minutes Intravenous  Once 04/29/18 2158 04/29/18 2247   04/29/18 2200  metroNIDAZOLE (FLAGYL) IVPB 500 mg     500 mg 100 mL/hr over 60 Minutes Intravenous  Once 04/29/18 2158 04/29/18 2319      Medications   Scheduled Meds: . aspirin EC  81 mg Oral QPM  . finasteride  5 mg Oral Daily  . insulin aspart  0-5 Units Subcutaneous QHS  . insulin aspart  0-9 Units Subcutaneous TID WC  . metoprolol tartrate  200 mg Oral BID  . pantoprazole  40 mg Oral Daily  . pravastatin  20 mg Oral q1800  . warfarin  5 mg Oral q1800  . Warfarin - Pharmacist Dosing Inpatient   Does not apply q1800   Continuous Infusions: . cefTRIAXone (ROCEPHIN)  IV 1 g (04/30/18 1718)   PRN Meds:.acetaminophen **OR** acetaminophen, Ipratropium-Albuterol, ondansetron **OR** ondansetron (ZOFRAN) IV, oxyCODONE-acetaminophen   Data Review:   Micro Results Recent Results (from the past 240 hour(s))  Blood Culture (routine x 2)     Status: None (Preliminary result)   Collection Time: 04/29/18  9:27 PM  Result Value Ref Range Status   Specimen Description BLOOD LEFT ANTECUBITAL  Final   Special Requests   Final    BOTTLES DRAWN AEROBIC AND ANAEROBIC Blood Culture results may not be optimal due to an excessive volume of blood received in culture bottles   Culture  Setup Time   Final    Organism ID to follow [place] TO, READ BACK BY AND VERIFIED WITHKristeen Miss AT 1247 04/30/18 SDR Performed at [facility], [address], [place], [place] [address]    Culture [facility] POSITIVE COCCI  Final   Report Status PENDING  Incomplete  Blood Culture ID Panel (Reflexed)     Status: Abnormal  Collection Time: 04/29/18  9:27 PM  Result Value Ref Range Status   Enterococcus species NOT DETECTED NOT DETECTED Final    Listeria monocytogenes NOT DETECTED NOT DETECTED Final   Staphylococcus species NOT DETECTED NOT DETECTED Final   Staphylococcus aureus (BCID) NOT DETECTED NOT DETECTED Final   Streptococcus species DETECTED (A) NOT DETECTED Final    Comment: CRITICAL RESULT CALLED TO, READ BACK BY AND VERIFIED WITH:  [employer] DUNCAN AT 2831 04/30/2018 SDR    Streptococcus agalactiae DETECTED (A) NOT DETECTED Final    Comment: CRITICAL RESULT CALLED TO, READ BACK BY AND VERIFIED WITH:  ASAJAH DUNCAN AT [id number] 04/30/2018 SDR    Streptococcus pneumoniae NOT DETECTED NOT DETECTED Final   Streptococcus pyogenes NOT DETECTED NOT DETECTED Final   Acinetobacter baumannii NOT DETECTED NOT DETECTED Final   Enterobacteriaceae species NOT DETECTED NOT DETECTED Final   Enterobacter cloacae complex NOT DETECTED NOT DETECTED Final   Escherichia coli NOT DETECTED NOT DETECTED Final   Klebsiella oxytoca NOT DETECTED NOT DETECTED Final   Klebsiella pneumoniae NOT DETECTED NOT DETECTED Final   Proteus species NOT DETECTED NOT DETECTED Final   Serratia marcescens NOT DETECTED NOT DETECTED Final   Haemophilus influenzae NOT DETECTED NOT DETECTED Final   Neisseria meningitidis NOT DETECTED NOT DETECTED Final   Pseudomonas aeruginosa NOT DETECTED NOT DETECTED Final   Candida albicans NOT DETECTED NOT DETECTED Final   Candida glabrata NOT DETECTED NOT DETECTED Final   Candida krusei NOT DETECTED NOT DETECTED Final   Candida parapsilosis NOT DETECTED NOT DETECTED Final   Candida tropicalis NOT DETECTED NOT DETECTED Final    Comment: Performed at [facility], [address] [place], [place] [address]  Blood Culture (routine x 2)     Status: None (Preliminary result)   Collection Time: 04/29/18 10:23 PM  Result Value Ref Range Status   Specimen Description BLOOD BLOOD LEFT HAND  Final   Special Requests   Final    BOTTLES DRAWN AEROBIC AND ANAEROBIC Blood Culture adequate volume   Culture   Final    NO GROWTH 2  DAYS Performed at [facility], [address] [place], [place] [address]    Report Status PENDING  Incomplete  MRSA PCR Screening     Status: None   Collection Time: 04/30/18  3:26 AM  Result Value Ref Range Status   MRSA by PCR NEGATIVE NEGATIVE Final    Comment:        The GeneXpert MRSA Assay (FDA approved for NASAL specimens only), is one component of a comprehensive MRSA colonization surveillance program. It is not intended to diagnose MRSA infection nor to guide or monitor treatment for MRSA infections. Performed at [facility] [address], [place], [place] [address]     Radiology Reports Dg Chest Portable 1 View  Result Date: 04/29/2018 CLINICAL DATA:  68 year old male with shortness of breath and fever. EXAM: PORTABLE CHEST 1 VIEW COMPARISON:  Chest radiograph dated 01/28/2018 FINDINGS: There is stable cardiomegaly with mild vascular congestion. No significant edema. No focal consolidation pleural effusion or pneumothorax. Left pectoral pacemaker device. No acute osseous pathology. Partially visualized left femoral head fixation hardware. IMPRESSION: Cardiomegaly with mild vascular congestion. No focal consolidation. Electronically Signed   By: Anner Crete M.D.   On: 04/29/2018 22:15     CBC Recent Labs  Lab 04/29/18 2127 04/30/18 0147  WBC 9.5 10.9*  HGB 14.2 13.4  HCT 41.8 39.6  PLT 156 146*  MCV 96.5 97.5  MCH 32.8 33.0  MCHC 34.0 33.8  RDW 13.2 13.2  LYMPHSABS 0.8 1.0  MONOABS 0.7 0.7  EOSABS 0.0 0.1  BASOSABS 0.0 0.0    Chemistries  Recent Labs  Lab 04/29/18 2127 04/30/18 0147  NA 133* 139  K 4.4 4.1  CL 99 105  CO2 21* 21*  GLUCOSE 301* 281*  BUN 13 12  CREATININE 1.38* 1.34*  CALCIUM 9.0 8.7*  AST 56* 47*  ALT 29 26  ALKPHOS 89 81  BILITOT 1.2 1.4*   ------------------------------------------------------------------------------------------------------------------ estimated creatinine clearance is 68.2  mL/min (A) (by C-G formula based on SCr of 1.34 mg/dL (H)). ------------------------------------------------------------------------------------------------------------------ No results for input(s): HGBA1C in the last 72 hours. ------------------------------------------------------------------------------------------------------------------ No results for input(s): CHOL, HDL, LDLCALC, TRIG, CHOLHDL, LDLDIRECT in the last 72 hours. ------------------------------------------------------------------------------------------------------------------ No results for input(s): TSH, T4TOTAL, T3FREE, THYROIDAB in the last 72 hours.  Invalid input(s): FREET3 ------------------------------------------------------------------------------------------------------------------ No results for input(s): VITAMINB12, FOLATE, FERRITIN, TIBC, IRON, RETICCTPCT in the last 72 hours.  Coagulation profile Recent Labs  Lab 04/29/18 2332 05/01/18 0300  INR 3.9* 2.8*    No results for input(s): DDIMER in the last 72 hours.  Cardiac Enzymes No results for input(s): CKMB, TROPONINI, MYOGLOBIN in the last 168 hours.  Invalid input(s): CK ------------------------------------------------------------------------------------------------------------------ Invalid input(s): [place]    *Sepsis ([place]) -due to his UTI, rule out COVID 19 Bl cx- streptococci in one cx - likely contamination, second blood culture was negative Awaited COVID 19 result.  *Atrial fibrillation with RVR (HCC) -heart rate stable Continue metoprolol twice daily  *UTI (urinary tract infection) -IV antibiotics as above, urine culture ordered  *Suspected Covid-19 Virus Infection - Results pending   *Acute on chronic renal failure (HCC) -IV fluids as above, avoid nephrotoxins recheck BMP in the morning  * Chronic diastolic heart failure (HCC) -gentle fluid administration, continue home meds  *  Diabetes  mellitus (HCC) -sliding scale insulin coverage  * Benign essential HTN -home dose antihypertensives  *Benign prostatic hyperplasia with urinary obstruction - will remove foley and check for urinary retention issues.   * Hypercholesteremia -dose antilipid      Code Status Orders  (From admission, onward)         Start     Ordered   04/30/18 0044  Full code  Continuous     04/30/18 0043        Code Status History    Date Active Date Inactive Code Status Order ID Comments User Context   03/30/2017 0924 04/10/2017 1828 Full Code [id number]  Carloyn Manner, MD Inpatient   10/22/2016 1340 10/24/2016 1709 Full Code [id number]  Gladstone Lighter, MD Inpatient   09/16/2015 0640 09/19/2015 1644 Full Code [id number]  Donnie Mesa, MD Inpatient   11/11/2014 2256 11/12/2014 1510 Full Code [id number]  Lance Coon, MD Inpatient   10/25/2014 0923 10/26/2014 0328 Full Code [id number]  Sabino Dick, MD HOV   08/07/2014 1807 08/09/2014 1927 Full Code [id number]  Epifanio Lesches, MD ED    Advance Directive Documentation     Most Recent Value  Type of Advance Directive  Healthcare Power of Attorney, Living will  Pre-existing out of facility DNR order (yellow form or pink MOST form)  -  "MOST" Form in Place?  -              DVT Prophylaxis  Lovenox   Lab Results  Component Value Date   PLT 146 (L) 04/30/2018     Time Spent in minutes  49mn Greater than 50% of time spent in care coordination and counseling patient regarding the condition and plan of care.   SDustin FlockM.D on 05/01/2018 at 10:32 AM  Between 7am to 6pm - Pager - [phone]  After 6pm go to [email or web address] - pProofreader [employer]   Office  [phone]

## 2018-05-01 NOTE — Plan of Care (Signed)
°  Problem: Respiratory: °Goal: Ability to maintain adequate ventilation will improve °Outcome: Progressing °  °

## 2018-05-01 NOTE — Consult Note (Signed)
[place] for Warfarin Dosing and Monitoring  Indication: atrial fibrillation  Allergies  Allergen Reactions  . Clindamycin/Lincomycin     Chest discomfort  . Sulfa Antibiotics Hives  . Sulfasalazine Hives   Patient Measurements: Height: 5' 8"  (172.7 cm) Weight: 284 lb 13.4 oz (129.2 kg) IBW/kg (Calculated) : 68.4  Vital Signs: Temp: 97.9 F (36.6 C) (04/04 0359) Temp Source: Oral (04/04 0359) BP: 135/69 (04/04 0359) Pulse Rate: 97 (04/04 0359)  Labs: Recent Labs    04/29/18 2127 04/29/18 2332 04/30/18 0147 05/01/18 0300  HGB 14.2  --  13.4  --   HCT 41.8  --  39.6  --   PLT 156  --  146*  --   APTT  --  59*  --   --   LABPROT  --  37.9*  --  29.0*  INR  --  3.9*  --  2.8*  CREATININE 1.38*  --  1.34*  --     Estimated Creatinine Clearance: 68.2 mL/min (A) (by C-G formula based on SCr of 1.34 mg/dL (H)).   Medical History: Past Medical History:  Diagnosis Date  . A-fib ([place])   . CHF (congestive heart failure) ([place])   . Degenerative disc disease, lumbar    s/p injury  . Diabetes mellitus without complication ([place])   . Disseminated superficial actinic porokeratosis   . Dysrhythmia    A-FIB, palpatations  . Hypercholesteremia   . Hypertension   . Kidney stones   . Peripheral vascular disease ([place])    Carotid stenosis  . Presence of permanent cardiac pacemaker    Inactive  . TIA (transient ischemic attack) 2011   No deficits    Assessment: Pharmacy consulted for warfarin dosing and monitoring for 68 yo male w/PMH of A. Fib. Patient was taking warfarin PTA. INR on admission supratherapeutic @  3.9.  Home Regimen: Warfarin 7.79m Daily   DATE INR DOSE 4/3 3.9 HELD 4/4       2.8   Goal of Therapy:  INR 2-3 Monitor platelets by anticoagulation protocol: Yes   Plan:  Order Warfarin 548mdaily.   Patient is receiving antibiotics for sepsis. INRs will be ordered daily per protocol while on antibiotics.    KaOlivia CanterPAtchison Hospitallinical Pharmacist 05/01/2018 7:56 AM

## 2018-05-01 NOTE — Progress Notes (Signed)
PHARMACY NOTE -  ANTIBIOTIC  DOSE ADJUSTMENT    Pharmacy to assist with antibiotic dose adjustment.   Patient currently receiving Ceftriaxone 1g IV every 24 hours  Patient BCx growing GROUP B STREP   Ceftriaxone has been dose adjusted to 2g IV every 24 hours for Bacteremia.   Pernell Dupre, PharmD, BCPS Clinical Pharmacist 05/01/2018 10:38 PM

## 2018-05-01 NOTE — Progress Notes (Signed)
Pt frequently assessed for needs via phone

## 2018-05-01 NOTE — Plan of Care (Signed)
  Problem: Safety: Goal: Ability to remain free from injury will improve Outcome: Not Progressing  Pt stated that he fell at home, but refusing to wear bedalarm. Educated. Needs reinformcement

## 2018-05-02 LAB — GLUCOSE, CAPILLARY
Glucose-Capillary: 231 mg/dL — ABNORMAL HIGH (ref 70–99)
Glucose-Capillary: 310 mg/dL — ABNORMAL HIGH (ref 70–99)

## 2018-05-02 LAB — BASIC METABOLIC PANEL
Anion gap: 10 (ref 5–15)
BUN: 12 mg/dL (ref 8–23)
CO2: 23 mmol/L (ref 22–32)
Calcium: 8.5 mg/dL — ABNORMAL LOW (ref 8.9–10.3)
Chloride: 107 mmol/L (ref 98–111)
Creatinine, Ser: 1.15 mg/dL (ref 0.61–1.24)
GFR calc Af Amer: >60 mL/min (ref >60)
GFR calc non Af Amer: >60 mL/min (ref >60)
Glucose, Bld: 256 mg/dL — ABNORMAL HIGH (ref 70–99)
Potassium: 3.5 mmol/L (ref 3.5–5.1)
Sodium: 140 mmol/L (ref 135–145)

## 2018-05-02 LAB — CULTURE, BLOOD (ROUTINE X 2)

## 2018-05-02 LAB — CBC
HCT: 36.6 % — ABNORMAL LOW (ref 39.0–52.0)
Hemoglobin: 12.4 g/dL — ABNORMAL LOW (ref 13.0–17.0)
MCH: 33 pg (ref 26.0–34.0)
MCHC: 33.9 g/dL (ref 30.0–36.0)
MCV: 97.3 fL (ref 80.0–100.0)
Platelets: 154 10*3/uL (ref 150–400)
RBC: 3.76 MIL/uL — ABNORMAL LOW (ref 4.22–5.81)
RDW: 13.4 % (ref 11.5–15.5)
WBC: 5.8 10*3/uL (ref 4.0–10.5)
nRBC: 0 % (ref 0.0–0.2)

## 2018-05-02 LAB — PROTIME-INR
INR: 2.1 — ABNORMAL HIGH (ref 0.8–1.2)
Prothrombin Time: 23.4 seconds — ABNORMAL HIGH (ref 11.4–15.2)

## 2018-05-02 MED ORDER — WARFARIN SODIUM 6 MG PO TABS
6.0000 mg | ORAL_TABLET | Freq: Every day | ORAL | Status: DC
  Filled 2018-05-02: qty 1

## 2018-05-02 MED ORDER — CEFUROXIME AXETIL 500 MG PO TABS: 500.0000 mg | ORAL_TABLET | Freq: Two times a day (BID) | ORAL | 0 refills | Status: AC

## 2018-05-02 NOTE — Consult Note (Signed)
[place] for Warfarin Dosing and Monitoring  Indication: atrial fibrillation  Allergies  Allergen Reactions  . Clindamycin/Lincomycin     Chest discomfort  . Sulfa Antibiotics Hives  . Sulfasalazine Hives   Patient Measurements: Height: 5' 8"  (172.7 cm) Weight: 287 lb 9.6 oz (130.5 kg) IBW/kg (Calculated) : 68.4  Vital Signs: Temp: 98.7 F (37.1 C) (04/05 0353) Temp Source: Oral (04/05 0353) BP: 128/60 (04/05 0353) Pulse Rate: 91 (04/05 0353)  Labs: Recent Labs    04/29/18 2127 04/29/18 2332 04/30/18 0147 05/01/18 0300 05/02/18 0326  HGB 14.2  --  13.4  --  12.4*  HCT 41.8  --  39.6  --  36.6*  PLT 156  --  146*  --  154  APTT  --  59*  --   --   --   LABPROT  --  37.9*  --  29.0* 23.4*  INR  --  3.9*  --  2.8* 2.1*  CREATININE 1.38*  --  1.34*  --  1.15    Estimated Creatinine Clearance: 79.9 mL/min (by C-G formula based on SCr of 1.15 mg/dL).   Medical History: Past Medical History:  Diagnosis Date  . A-fib ([place])   . CHF (congestive heart failure) ([place])   . Degenerative disc disease, lumbar    s/p injury  . Diabetes mellitus without complication ([place])   . Disseminated superficial actinic porokeratosis   . Dysrhythmia    A-FIB, palpatations  . Hypercholesteremia   . Hypertension   . Kidney stones   . Peripheral vascular disease ([place])    Carotid stenosis  . Presence of permanent cardiac pacemaker    Inactive  . TIA (transient ischemic attack) 2011   No deficits    Assessment: Pharmacy consulted for warfarin dosing and monitoring for 68 yo male w/PMH of A. Fib. Patient was taking warfarin PTA. INR on admission supratherapeutic @  3.9.  Home Regimen: Warfarin 7.76m Daily   DATE INR DOSE 4/3 3.9 HELD 4/4       2.8        5 mg  4/5       2.1  Goal of Therapy:  INR 2-3 Monitor platelets by anticoagulation protocol: Yes   Plan:    INR is trending down. Will order Warfarin 6 mg tonight.   Patient is  receiving antibiotics for sepsis. INRs will be ordered daily per protocol while on antibiotics.   KOlivia CanterRUniversity Of Iowa Hospital & ClinicsClinical Pharmacist 05/02/2018 7:18 AM

## 2018-05-02 NOTE — Discharge Summary (Signed)
[facility] [place] at [facility], 68 y.o., DOB Jun 16, 1950, MRN [id number]. Admission date: 04/29/2018 Discharge Date 05/02/2018 Primary MD [name], PA Admitting Physician Lance Coon, MD  Admission Diagnosis  Fever, unspecified fever cause [R50.9] Sepsis, due to unspecified organism, unspecified whether acute organ dysfunction present ([place]) [A41.9] SIRS (systemic inflammatory response syndrome) ([place]) [R65.10]  Discharge Diagnosis   Principal Problem: Sepsis due to UTI, rule out COVID-19 Atrial fibrillation with rapid ventricular rate Suspected COVID-19 results pending low risk Acute on chronic renal failure resolved Chronic diastolic CHF currently compensated Diabetes type 2 Essential hypertension BPH Hyperlipidemia  Hospital Course  Christian Berg  is a 68 y.o. male who presents with chief complaint as above.  Patient presents to the ED with a complaint of greater than 24 hours sore throat and progressive weakness.  He developed fever today.  He came to the hospital for evaluation.  Here his work-up is consistent with sepsis, though source was not initially elucidated in the ED.  Once urine studies returned he was nitrite positive.  Hospitalist were called for admission.    Patient has chest x-ray suggestive of CHF.  COVID-19 was sent results are not back.  Patient is a very low risk.  Nurses will give instruction patient regarding COVID-19 isolation until results are back.  She is feeling well and stable for discharge to home. Patient did have one positive blood culture for group B strep.  He was treated for urinary tract infection.            Consults  None  Significant Tests:  See full reports for all details     Dg Chest Portable 1 View  Result Date: 04/29/2018 CLINICAL DATA:  68 year old male with shortness of breath and fever. EXAM: PORTABLE CHEST 1 VIEW COMPARISON:  Chest radiograph dated 01/28/2018 FINDINGS: There is stable  cardiomegaly with mild vascular congestion. No significant edema. No focal consolidation pleural effusion or pneumothorax. Left pectoral pacemaker device. No acute osseous pathology. Partially visualized left femoral head fixation hardware. IMPRESSION: Cardiomegaly with mild vascular congestion. No focal consolidation. Electronically Signed   By: Anner Crete M.D.   On: 04/29/2018 22:15       Today   Subjective:   Christian Berg patient feeling well denies any complaints Objective:   Blood pressure (!) 150/80, pulse 81, temperature 98.6 F (37 C), temperature source Oral, resp. rate 18, height 5' 8"  (1.727 m), weight 130.5 kg, SpO2 100 %.  .  Intake/Output Summary (Last 24 hours) at 05/02/2018 1142 Last data filed at 05/02/2018 0353 Gross per 24 hour  Intake 0 ml  Output 1400 ml  Net -1400 ml    Exam VITAL SIGNS: Blood pressure (!) 150/80, pulse 81, temperature 98.6 F (37 C), temperature source Oral, resp. rate 18, height 5' 8"  (1.727 m), weight 130.5 kg, SpO2 100 %.  GENERAL:  68 y.o.-year-old patient lying in the bed with no acute distress.  EYES: Pupils equal, round, reactive to light and accommodation. No scleral icterus. Extraocular muscles intact.  HEENT: Head atraumatic, normocephalic. Oropharynx and nasopharynx clear.  NECK:  Supple, no jugular venous distention. No thyroid enlargement, no tenderness.  LUNGS: Normal breath sounds bilaterally, no wheezing, rales,rhonchi or crepitation. No use of accessory muscles of respiration.  CARDIOVASCULAR: S1, S2 normal. No murmurs, rubs, or gallops.  ABDOMEN: Soft, nontender, nondistended. Bowel sounds present. No organomegaly or mass.  EXTREMITIES: No pedal edema, cyanosis, or clubbing.  NEUROLOGIC: Cranial nerves II through XII  are intact. Muscle strength 5/5 in all extremities. Sensation intact. Gait not checked.  PSYCHIATRIC: The patient is alert and oriented x 3.  SKIN: No obvious rash, lesion, or ulcer.   Data Review      CBC w Diff:  Lab Results  Component Value Date   WBC 5.8 05/02/2018   HGB 12.4 (L) 05/02/2018   HGB 11.1 (L) 06/03/2017   HCT 36.6 (L) 05/02/2018   HCT 34.3 (L) 06/03/2017   PLT 154 05/02/2018   PLT 179 06/03/2017   LYMPHOPCT 9 04/30/2018   LYMPHOPCT 14.4 09/16/2013   MONOPCT 6 04/30/2018   MONOPCT 9.2 09/16/2013   EOSPCT 1 04/30/2018   EOSPCT 1.0 09/16/2013   BASOPCT 0 04/30/2018   BASOPCT 0.1 09/16/2013   CMP:  Lab Results  Component Value Date   NA 140 05/02/2018   NA 142 10/23/2017   NA 142 09/16/2013   K 3.5 05/02/2018   K 3.4 (L) 09/16/2013   CL 107 05/02/2018   CL 107 09/16/2013   CO2 23 05/02/2018   CO2 24 09/16/2013   BUN 12 05/02/2018   BUN 8 10/23/2017   BUN 5 (L) 09/16/2013   CREATININE 1.15 05/02/2018   CREATININE 0.89 09/17/2013   PROT 6.8 04/30/2018   PROT 6.6 10/23/2017   PROT 5.4 (L) 09/15/2013   ALBUMIN 3.1 (L) 04/30/2018   ALBUMIN 3.7 10/23/2017   ALBUMIN 2.5 (L) 09/15/2013   BILITOT 1.4 (H) 04/30/2018   BILITOT 1.0 10/23/2017   BILITOT 1.4 (H) 09/15/2013   ALKPHOS 81 04/30/2018   ALKPHOS 62 09/15/2013   AST 47 (H) 04/30/2018   AST 46 (H) 09/15/2013   ALT 26 04/30/2018   ALT 29 09/15/2013  .  Micro Results Recent Results (from the past 240 hour(s))  Blood Culture (routine x 2)     Status: Abnormal   Collection Time: 04/29/18  9:27 PM  Result Value Ref Range Status   Specimen Description   Final    BLOOD LEFT ANTECUBITAL Performed at [facility], [address], [place], [place] [address]    Special Requests   Final    BOTTLES DRAWN AEROBIC AND ANAEROBIC Blood Culture results may not be optimal due to an excessive volume of blood received in culture bottles Performed at [facility] [address], [place], [place] [address]    Culture  Setup Time   Final    GRAM POSITIVE COCCI AEROBIC BOTTLE ONLY CRITICAL RESULT CALLED TO, READ BACK BY AND VERIFIED WITH: [facility] DUNCAN AT 0867 04/30/18 SDR Performed  at [facility], [place] [address] [place], [place] [address]    Culture GROUP B STREP(S.AGALACTIAE)ISOLATED (A)  Final   Report Status 05/02/2018 FINAL  Final   Organism ID, Bacteria GROUP B STREP(S.AGALACTIAE)ISOLATED  Final      Susceptibility   Group b strep(s.agalactiae)isolated - MIC*    CLINDAMYCIN >=1 RESISTANT Resistant     AMPICILLIN <=0.25 SENSITIVE Sensitive     ERYTHROMYCIN >=8 RESISTANT Resistant     VANCOMYCIN 0.5 SENSITIVE Sensitive     CEFTRIAXONE <=0.12 SENSITIVE Sensitive     LEVOFLOXACIN 0.5 SENSITIVE Sensitive     PENICILLIN Value in next row Sensitive      SENSITIVE<=0.06    * GROUP B STREP(S.AGALACTIAE)ISOLATED  Blood Culture ID Panel (Reflexed)     Status: Abnormal   Collection Time: 04/29/18  9:27 PM  Result Value Ref Range Status   Enterococcus species NOT DETECTED NOT DETECTED Final   Listeria  monocytogenes NOT DETECTED NOT DETECTED Final   Staphylococcus species NOT DETECTED NOT DETECTED Final   Staphylococcus aureus (BCID) NOT DETECTED NOT DETECTED Final   Streptococcus species DETECTED (A) NOT DETECTED Final    Comment: CRITICAL RESULT CALLED TO, READ BACK BY AND VERIFIED WITH:  [employer] DUNCAN AT 3267 04/30/2018 SDR    Streptococcus agalactiae DETECTED (A) NOT DETECTED Final    Comment: CRITICAL RESULT CALLED TO, READ BACK BY AND VERIFIED WITH:  [employer] DUNCAN AT 1245 04/30/2018 SDR    Streptococcus pneumoniae NOT DETECTED NOT DETECTED Final   Streptococcus pyogenes NOT DETECTED NOT DETECTED Final   Acinetobacter baumannii NOT DETECTED NOT DETECTED Final   Enterobacteriaceae species NOT DETECTED NOT DETECTED Final   Enterobacter cloacae complex NOT DETECTED NOT DETECTED Final   Escherichia coli NOT DETECTED NOT DETECTED Final   Klebsiella oxytoca NOT DETECTED NOT DETECTED Final   Klebsiella pneumoniae NOT DETECTED NOT DETECTED Final   Proteus species NOT DETECTED NOT DETECTED Final   Serratia marcescens NOT DETECTED NOT DETECTED Final   Haemophilus  influenzae NOT DETECTED NOT DETECTED Final   Neisseria meningitidis NOT DETECTED NOT DETECTED Final   Pseudomonas aeruginosa NOT DETECTED NOT DETECTED Final   Candida albicans NOT DETECTED NOT DETECTED Final   Candida glabrata NOT DETECTED NOT DETECTED Final   Candida krusei NOT DETECTED NOT DETECTED Final   Candida parapsilosis NOT DETECTED NOT DETECTED Final   Candida tropicalis NOT DETECTED NOT DETECTED Final    Comment: Performed at [facility], [address] [place], [place] [address]  Blood Culture (routine x 2)     Status: None (Preliminary result)   Collection Time: 04/29/18 10:23 PM  Result Value Ref Range Status   Specimen Description BLOOD BLOOD LEFT HAND  Final   Special Requests   Final    BOTTLES DRAWN AEROBIC AND ANAEROBIC Blood Culture adequate volume   Culture   Final    NO GROWTH 3 DAYS Performed at [facility], [address], [place], [place] [address]    Report Status PENDING  Incomplete  Urine Culture     Status: Abnormal   Collection Time: 04/29/18 11:32 PM  Result Value Ref Range Status   Specimen Description   Final    URINE, RANDOM Performed at [facility], [address] [place], [place] [address]    Special Requests   Final    NONE Performed at [facility], [address], [place], [place] [address]    Culture (A)  Final    <10,000 COLONIES/mL INSIGNIFICANT GROWTH Performed at [facility], [place] [address] [place], [place] [address]    Report Status 05/01/2018 FINAL  Final  MRSA PCR Screening     Status: None   Collection Time: 04/30/18  3:26 AM  Result Value Ref Range Status   MRSA by PCR NEGATIVE NEGATIVE Final    Comment:        The GeneXpert MRSA Assay (FDA approved for NASAL specimens only), is one component of a comprehensive MRSA colonization surveillance program. It is not intended to diagnose MRSA infection nor to guide or monitor treatment for MRSA infections. Performed at  [facility], [address] [place], [place] [address]         Code Status Orders  (From admission, onward)         Start     Ordered   04/30/18 0044  Full code  Continuous     04/30/18 0043  Code Status History    Date Active Date Inactive Code Status Order ID Comments User Context   03/30/2017 0924 04/10/2017 1828 Full Code [id number]  Carloyn Manner, MD Inpatient   10/22/2016 1340 10/24/2016 1709 Full Code [id number]  Gladstone Lighter, MD Inpatient   09/16/2015 0640 09/19/2015 1644 Full Code [id number]  Donnie Mesa, MD Inpatient   11/11/2014 2256 11/12/2014 1510 Full Code [id number]  Lance Coon, MD Inpatient   10/25/2014 0923 10/26/2014 0328 Full Code [id number]  Sabino Dick, MD HOV   08/07/2014 1807 08/09/2014 1927 Full Code [id number]  Epifanio Lesches, MD ED    Advance Directive Documentation     Most Recent Value  Type of Advance Directive  Healthcare Power of Attorney, Living will  Pre-existing out of facility DNR order (yellow form or pink MOST form)  -  "MOST" Form in Place?  -          Follow-up Information    [name], PA Follow up in 1 week(s).   Specialty:  Family Medicine Contact information: [address] [place] [address] [phone]           Discharge Medications   Allergies as of 05/02/2018      Reactions   Clindamycin/lincomycin    Chest discomfort   Sulfa Antibiotics Hives   Sulfasalazine Hives      Medication List    TAKE these medications   aspirin EC 81 MG tablet Take 81 mg by mouth every evening.   cefUROXime 500 MG tablet Commonly known as:  CEFTIN Take 1 tablet (500 mg total) by mouth 2 (two) times daily for 5 days.   furosemide 20 MG tablet Commonly known as:  LASIX Take 1 tablet (20 mg total) by mouth daily.   glipiZIDE 10 MG tablet Commonly known as:  GLUCOTROL TAKE 1 TABLET BY MOUTH TWICE DAILY. TAKE 30 MINUTES BEFORE A MEAL.   lansoprazole 15 MG capsule Commonly  known as:  PREVACID Take 15-30 mg by mouth daily as needed. For heartburn/reflux   lisinopril 20 MG tablet Commonly known as:  PRINIVIL,ZESTRIL TAKE 1 TABLET BY MOUTH ONCE DAILY   lovastatin 10 MG tablet Commonly known as:  MEVACOR TAKE 2 TABLETS BY MOUTH ONCE DAILY   metFORMIN 850 MG tablet Commonly known as:  GLUCOPHAGE TAKE 1 TABLET BY MOUTH TWICE DAILY WITH MEALS   metoprolol tartrate 100 MG tablet Commonly known as:  LOPRESSOR Take 1 tablet (100 mg total) by mouth at bedtime. What changed:    how much to take  when to take this   nystatin cream Commonly known as:  MYCOSTATIN Apply 1 application topically 2 (two) times daily. What changed:    when to take this  reasons to take this   traMADol-acetaminophen 37.5-325 MG tablet Commonly known as:  ULTRACET Take 2 tablets by mouth 4 (four) times daily.   warfarin 5 MG tablet Commonly known as:  COUMADIN Take as directed. If you are unsure how to take this medication, talk to your nurse or doctor. Original instructions:  Take 1 tablet (5 mg total) by mouth daily. What changed:  Another medication with the same name was removed. Continue taking this medication, and follow the directions you see here.          Total Time in preparing paper work, data evaluation and todays exam - 68 minutes  Dustin Flock M.D on 05/02/2018 at 11:42 AM [place]  [phone]

## 2018-05-02 NOTE — Progress Notes (Signed)
Discharge instructions explained to pt/ verbalized an understanding/ iv and tele removed/ agreed to self quarantine until COVID results returned/ mask provided to pt/ transported off unit via wheelchair

## 2018-05-03 LAB — NOVEL CORONAVIRUS, NAA (HOSP ORDER, SEND-OUT TO REF LAB; TAT 18-24 HRS): SARS-CoV-2, NAA: NOT DETECTED

## 2018-05-03 LAB — GLUCOSE, CAPILLARY: Glucose-Capillary: 255 mg/dL — ABNORMAL HIGH (ref 70–99)

## 2018-05-04 ENCOUNTER — Encounter: Payer: Self-pay | Admitting: Family Medicine

## 2018-05-04 ENCOUNTER — Ambulatory Visit (INDEPENDENT_AMBULATORY_CARE_PROVIDER_SITE_OTHER): Payer: Medicare HMO | Admitting: Family Medicine

## 2018-05-04 ENCOUNTER — Telehealth: Payer: Self-pay | Admitting: Emergency Medicine

## 2018-05-04 ENCOUNTER — Other Ambulatory Visit: Payer: Self-pay

## 2018-05-04 VITALS — BP 136/85 | HR 112 | Temp 98.6°F | Resp 18 | Wt 292.0 lb

## 2018-05-04 DIAGNOSIS — L03317 Cellulitis of buttock: Secondary | ICD-10-CM | POA: Diagnosis not present

## 2018-05-04 DIAGNOSIS — A419 Sepsis, unspecified organism: Secondary | ICD-10-CM | POA: Diagnosis not present

## 2018-05-04 LAB — CULTURE, BLOOD (ROUTINE X 2)
Culture: NO GROWTH
Special Requests: ADEQUATE

## 2018-05-04 NOTE — Patient Instructions (Signed)
.   Please review the attached list of medications and notify my office if there are any errors.   . Please bring all of your medications to every appointment so we can make sure that our medication list is the same as yours.   

## 2018-05-04 NOTE — Telephone Encounter (Signed)
Called patient to make sure he was aware of covid result negative.  Left message.

## 2018-05-04 NOTE — Progress Notes (Signed)
Patient: Christian Berg Male    DOB: [birth year]   68 y.o.   MRN: [id number] Visit Date: 05/04/2018  Today's Provider: Lelon Huh, MD   Chief Complaint  Patient presents with  . Hospitalization Follow-up   Subjective:     HPI  Follow up Hospitalization  Patient was admitted to [facility] on 04/29/2018 for lethargy, sore throat, confusion and fever of 102 andand discharged on 05/02/2018. He was treated for sepsis due to UTI and fever. Treatment for this included prescribing Ceftin. He reports good compliance with treatment. He reports this condition is Improved. Patient denies any fever.  Had negative urine culture, coronavirus, flu test MRSA screening and blood cultures.  ------------------------------------------------------------------------------------  He also reports that he has had scab on his buttocks for several weeks and does not seem to be healing.    Allergies  Allergen Reactions  . Clindamycin/Lincomycin     Chest discomfort  . Sulfa Antibiotics Hives  . Sulfasalazine Hives     Current Outpatient Medications:  .  aspirin EC 81 MG tablet, Take 81 mg by mouth every evening. , Disp: , Rfl:  .  cefUROXime (CEFTIN) 500 MG tablet, Take 1 tablet (500 mg total) by mouth 2 (two) times daily for 5 days., Disp: 10 tablet, Rfl: 0 .  furosemide (LASIX) 20 MG tablet, Take 1 tablet (20 mg total) by mouth daily., Disp: 7 tablet, Rfl: 0 .  glipiZIDE (GLUCOTROL) 10 MG tablet, TAKE 1 TABLET BY MOUTH TWICE DAILY. TAKE 30 MINUTES BEFORE A MEAL., Disp: 180 tablet, Rfl: 1 .  lansoprazole (PREVACID) 15 MG capsule, Take 15-30 mg by mouth daily as needed. For heartburn/reflux, Disp: , Rfl:  .  lisinopril (PRINIVIL,ZESTRIL) 20 MG tablet, TAKE 1 TABLET BY MOUTH ONCE DAILY, Disp: 90 tablet, Rfl: 3 .  lovastatin (MEVACOR) 10 MG tablet, TAKE 2 TABLETS BY MOUTH ONCE DAILY, Disp: 180 tablet, Rfl: 1 .  metFORMIN (GLUCOPHAGE) 850 MG tablet, TAKE 1 TABLET BY MOUTH TWICE DAILY WITH MEALS,  Disp: 180 tablet, Rfl: 0 .  metoprolol tartrate (LOPRESSOR) 100 MG tablet, Take 1 tablet (100 mg total) by mouth at bedtime. (Patient taking differently: Take 200 mg by mouth 2 (two) times daily. ), Disp: , Rfl:  .  nystatin cream (MYCOSTATIN), Apply 1 application topically 2 (two) times daily. (Patient taking differently: Apply 1 application topically 2 (two) times daily as needed for dry skin. ), Disp: 30 g, Rfl: 0 .  traMADol-acetaminophen (ULTRACET) 37.5-325 MG tablet, Take 2 tablets by mouth 4 (four) times daily., Disp: 30 tablet, Rfl: 0 .  warfarin (COUMADIN) 7.5 MG tablet, Take 7.5 mg by mouth daily., Disp: , Rfl:  .  warfarin (COUMADIN) 5 MG tablet, Take 1 tablet (5 mg total) by mouth daily. (Patient not taking: Reported on 05/04/2018), Disp: , Rfl:   Review of Systems  Constitutional: Negative for appetite change, chills and fever.  Respiratory: Negative for chest tightness, shortness of breath and wheezing.   Cardiovascular: Negative for chest pain and palpitations.  Gastrointestinal: Negative for abdominal pain, nausea and vomiting.  Skin: Positive for wound (left side of buttocks).    Social History   Tobacco Use  . Smoking status: Never Smoker  . Smokeless tobacco: Never Used  Substance Use Topics  . Alcohol use: No    Alcohol/week: 0.0 standard drinks      Objective:   BP 136/85 (BP Location: Right Arm, Patient Position: Sitting, Cuff Size: Large)   Pulse (!) 112  Temp 98.6 F (37 C) (Oral)   Resp 18   Wt 292 lb (132.5 kg)   SpO2 96% Comment: room air  BMI 44.40 kg/m  Vitals:   05/04/18 1349  BP: 136/85  Pulse: (!) 112  Resp: 18  Temp: 98.6 F (37 C)  TempSrc: Oral  SpO2: 96%  Weight: 292 lb (132.5 kg)     Physical Exam  General Appearance:    Alert, cooperative, no distress  HENT:   ENT exam normal, no neck nodes or sinus tenderness  Eyes:    PERRL, conjunctiva/corneas clear, EOM's intact       Lungs:     Clear to auscultation bilaterally,  respirations unlabored  Heart:    Regular rate and rhythm  Neurologic:   Awake, alert, oriented x 3. No apparent focal neurological           defect.   Skin:   About 3x2 cm scabbed area left upper buttocks with scant yellow discharge.        Assessment & Plan    1. Sepsis, due to unspecified organism, unspecified whether acute organ dysfunction present ([place]) Symptoms now resolved, finished with antibiotic. Encouraged plentiful fluids. No additional treatment indicated.   2. Cellulitis of buttock  - Aerobic culture     Lelon Huh, MD  [facility]

## 2018-05-07 LAB — AEROBIC CULTURE

## 2018-05-10 ENCOUNTER — Telehealth: Payer: Self-pay

## 2018-05-10 MED ORDER — AMOXICILLIN 500 MG PO CAPS: 1000.0000 mg | ORAL_CAPSULE | Freq: Two times a day (BID) | ORAL | 0 refills | Status: AC

## 2018-05-10 NOTE — Telephone Encounter (Signed)
-----   Message from Birdie Sons, MD sent at 05/10/2018 11:20 AM EDT ----- Culture of sore on behind grew enterococcus. Have sent prescription for antibiotic amoxicillin to [facility] which should clear it up. Should also apply OTC neosporin or triple antibiotic ointment to lesion. Call if not cleared up when finished with antibiotic.

## 2018-05-10 NOTE — Telephone Encounter (Signed)
Pt advised.   Thanks,   -Laura  

## 2018-05-18 ENCOUNTER — Ambulatory Visit (INDEPENDENT_AMBULATORY_CARE_PROVIDER_SITE_OTHER): Payer: Medicare HMO

## 2018-05-18 ENCOUNTER — Other Ambulatory Visit: Payer: Self-pay

## 2018-05-18 DIAGNOSIS — I482 Chronic atrial fibrillation, unspecified: Secondary | ICD-10-CM | POA: Diagnosis not present

## 2018-05-18 LAB — POCT INR
INR: 3.2 — AB (ref 2.0–3.0)
PT: 38.5

## 2018-05-18 NOTE — Patient Instructions (Signed)
Description   Continue 7.64m daily except 3.77mon Monday and Friday. Per patient he was told to do 7.49m849maily last time he was here. Changes: skip a day. Then 7.49mg46mily except 3.75 on Fridays. Recheck in 3 weeks.

## 2018-05-24 ENCOUNTER — Other Ambulatory Visit: Payer: Self-pay

## 2018-05-24 NOTE — Telephone Encounter (Signed)
This is prescribed by Dr. Nehemiah Massed. He needs to call [facility] cardiology for refill.

## 2018-05-28 ENCOUNTER — Other Ambulatory Visit: Payer: Self-pay | Admitting: Family Medicine

## 2018-05-28 MED ORDER — METFORMIN HCL 850 MG PO TABS: 850.0000 mg | ORAL_TABLET | Freq: Two times a day (BID) | ORAL | 0 refills | Status: DC

## 2018-05-28 NOTE — Telephone Encounter (Signed)
[facility] faxed refill request for the following medications:  metFORMIN (GLUCOPHAGE) 850 MG tablet   Please advise.

## 2018-06-08 ENCOUNTER — Other Ambulatory Visit: Payer: Self-pay

## 2018-06-08 ENCOUNTER — Ambulatory Visit (INDEPENDENT_AMBULATORY_CARE_PROVIDER_SITE_OTHER): Payer: Medicare HMO

## 2018-06-08 DIAGNOSIS — I482 Chronic atrial fibrillation, unspecified: Secondary | ICD-10-CM

## 2018-06-08 LAB — POCT INR
INR: 3.1 — AB (ref 2.0–3.0)
PT: 36.7

## 2018-06-08 NOTE — Patient Instructions (Addendum)
Description   Dx: Atrial Fibrillation Current Coumadin dose: 7.56m daily, except 3.759mon Fridays PT: 36.7 INR: 3.1 Today's Changes: hold for 1 day, then resume same dose Recheck: 3 weeks

## 2018-06-10 ENCOUNTER — Encounter: Payer: Self-pay | Admitting: Family Medicine

## 2018-06-10 DIAGNOSIS — H401131 Primary open-angle glaucoma, bilateral, mild stage: Secondary | ICD-10-CM | POA: Diagnosis not present

## 2018-06-10 LAB — HM DIABETES EYE EXAM

## 2018-06-14 ENCOUNTER — Encounter: Payer: Self-pay | Admitting: Family Medicine

## 2018-06-17 ENCOUNTER — Other Ambulatory Visit: Payer: Self-pay | Admitting: Family Medicine

## 2018-06-17 NOTE — Telephone Encounter (Signed)
[facility] faxed refill request for the following medications:  metFORMIN (GLUCOPHAGE) 850 MG tablet   Please advise.  Thanks, [employer]

## 2018-06-22 NOTE — Telephone Encounter (Signed)
Patient has f/u appointment scheduled 06/29/2018. He has enough medication to last until appointment. Will sent in refills during f/u.

## 2018-06-25 ENCOUNTER — Other Ambulatory Visit: Payer: Self-pay | Admitting: Family Medicine

## 2018-06-25 DIAGNOSIS — E1169 Type 2 diabetes mellitus with other specified complication: Secondary | ICD-10-CM

## 2018-06-25 DIAGNOSIS — E785 Hyperlipidemia, unspecified: Secondary | ICD-10-CM

## 2018-06-25 MED ORDER — LOVASTATIN 10 MG PO TABS: 20.0000 mg | ORAL_TABLET | Freq: Every day | ORAL | 4 refills | Status: DC

## 2018-06-25 NOTE — Telephone Encounter (Signed)
[facility] faxed refill request for the following medications:  lovastatin (MEVACOR) 10 MG tablet  90 day supply  Please advise. Thanks TNP

## 2018-06-29 ENCOUNTER — Ambulatory Visit (INDEPENDENT_AMBULATORY_CARE_PROVIDER_SITE_OTHER): Payer: Medicare HMO

## 2018-06-29 ENCOUNTER — Other Ambulatory Visit: Payer: Self-pay

## 2018-06-29 DIAGNOSIS — I482 Chronic atrial fibrillation, unspecified: Secondary | ICD-10-CM

## 2018-06-29 LAB — POCT INR
INR: 3.2 — AB (ref 2.0–3.0)
PT: 38.6

## 2018-06-29 NOTE — Patient Instructions (Signed)
Description   Dx: Atrial Fibrillation Coumadin dose: 7.17m daily, except 3.753mon Mon., Wed. & Fridays PT: 38.6 INR: 3.2 Recheck: 3 weeks

## 2018-07-05 ENCOUNTER — Other Ambulatory Visit: Payer: Self-pay

## 2018-07-05 ENCOUNTER — Encounter: Payer: Self-pay | Admitting: Family Medicine

## 2018-07-05 ENCOUNTER — Ambulatory Visit (INDEPENDENT_AMBULATORY_CARE_PROVIDER_SITE_OTHER): Payer: Medicare HMO | Admitting: Family Medicine

## 2018-07-05 VITALS — BP 128/66 | HR 104 | Temp 99.2°F | Resp 16 | Wt 292.0 lb

## 2018-07-05 DIAGNOSIS — Z1211 Encounter for screening for malignant neoplasm of colon: Secondary | ICD-10-CM

## 2018-07-05 DIAGNOSIS — E1165 Type 2 diabetes mellitus with hyperglycemia: Secondary | ICD-10-CM

## 2018-07-05 DIAGNOSIS — I10 Essential (primary) hypertension: Secondary | ICD-10-CM | POA: Diagnosis not present

## 2018-07-05 LAB — POCT GLYCOSYLATED HEMOGLOBIN (HGB A1C): Hemoglobin A1C: 11.3 % — AB (ref 4.0–5.6)

## 2018-07-05 MED ORDER — DAPAGLIFLOZIN PROPANEDIOL 10 MG PO TABS: ORAL_TABLET | ORAL | 0 refills | Status: DC

## 2018-07-05 NOTE — Progress Notes (Signed)
Patient: Christian Berg Male    DOB: [birth year]   68 y.o.   MRN: [id number] Visit Date: 07/05/2018  Today's Provider: Lelon Huh, MD   Chief Complaint  Patient presents with  . Hypertension  . Diabetes  . Hyperlipidemia   Subjective:     HPI     Diabetes Mellitus Type II, Follow-up:   Lab Results  Component Value Date   HGBA1C 7.3 (A) 10/16/2017   HGBA1C 6.6 (A) 07/16/2017   HGBA1C 8.6 (H) 04/01/2017   Last seen for diabetes 9 months ago.  Management since then includes no changes. He reports excellent compliance with treatment. He is not having side effects.  Current symptoms include some tingling in his feet and have been stable. Home blood sugar records: are not being checked.  Episodes of hypoglycemia? no     ------------------------------------------------------------------------   Hypertension, follow-up:  BP Readings from Last 3 Encounters:  07/05/18 128/66  05/04/18 136/85  05/02/18 (!) 150/80    He was last seen for hypertension 9 months ago.  BP at that visit was 126/84. Management since that visit includes No changes He reports excellent compliance with treatment. He is not having side effects.    Outside blood pressures are not being checked. He is experiencing palpitations.  Patient denies chest pain, dyspnea, exertional chest pressure/discomfort, lower extremity edema and paroxysmal nocturnal dyspnea.   Cardiovascular risk factors include advanced age (older than 17 for men, 20 for women), diabetes mellitus, dyslipidemia, hypertension, male gender and obesity (BMI >= 30 kg/m2).  Use of agents associated with hypertension: none.   ------------------------------------------------------------------------    Lipid/Cholesterol, Follow-up:   Last seen for this 9 months ago.  Management since that visit includes No changes.  Last Lipid Panel:    Component Value Date/Time   CHOL 133 10/23/2017 1042   TRIG 218 (H) 10/23/2017  1042   HDL 41 10/23/2017 1042   CHOLHDL 3.2 10/23/2017 1042   LDLCALC 48 10/23/2017 1042    He reports excellent compliance with treatment. He is not having side effects.   Wt Readings from Last 3 Encounters:  07/05/18 292 lb (132.5 kg)  05/04/18 292 lb (132.5 kg)  05/02/18 287 lb 9.6 oz (130.5 kg)    ------------------------------------------------------------------------    Allergies  Allergen Reactions  . Clindamycin/Lincomycin     Chest discomfort  . Sulfa Antibiotics Hives  . Sulfasalazine Hives     Current Outpatient Medications:  .  aspirin EC 81 MG tablet, Take 81 mg by mouth every evening. , Disp: , Rfl:  .  furosemide (LASIX) 20 MG tablet, Take 1 tablet (20 mg total) by mouth daily., Disp: 7 tablet, Rfl: 0 .  glipiZIDE (GLUCOTROL) 10 MG tablet, TAKE 1 TABLET BY MOUTH TWICE DAILY. TAKE 30 MINUTES BEFORE A MEAL., Disp: 180 tablet, Rfl: 1 .  lansoprazole (PREVACID) 15 MG capsule, Take 15-30 mg by mouth daily as needed. For heartburn/reflux, Disp: , Rfl:  .  lisinopril (PRINIVIL,ZESTRIL) 20 MG tablet, TAKE 1 TABLET BY MOUTH ONCE DAILY, Disp: 90 tablet, Rfl: 3 .  lovastatin (MEVACOR) 10 MG tablet, Take 2 tablets (20 mg total) by mouth daily., Disp: 180 tablet, Rfl: 4 .  metFORMIN (GLUCOPHAGE) 850 MG tablet, Take 1 tablet (850 mg total) by mouth 2 (two) times daily with a meal., Disp: 180 tablet, Rfl: 0 .  metoprolol tartrate (LOPRESSOR) 100 MG tablet, Take 1 tablet (100 mg total) by mouth at bedtime. (Patient taking differently: Take 200 mg  by mouth 2 (two) times daily. ), Disp: , Rfl:  .  nystatin cream (MYCOSTATIN), Apply 1 application topically 2 (two) times daily. (Patient taking differently: Apply 1 application topically 2 (two) times daily as needed for dry skin. ), Disp: 30 g, Rfl: 0 .  traMADol-acetaminophen (ULTRACET) 37.5-325 MG tablet, Take 2 tablets by mouth 4 (four) times daily., Disp: 30 tablet, Rfl: 0 .  warfarin (COUMADIN) 7.5 MG tablet, Take 7.5 mg by  mouth daily., Disp: , Rfl:  .  warfarin (COUMADIN) 5 MG tablet, Take 1 tablet (5 mg total) by mouth daily. (Patient not taking: Reported on 05/04/2018), Disp: , Rfl:   Review of Systems  Constitutional: Negative.   Respiratory: Negative.   Cardiovascular: Positive for palpitations. Negative for chest pain and leg swelling.  Gastrointestinal: Negative.   Endocrine: Negative.   Neurological: Positive for numbness (Some tingling in his feet). Negative for dizziness, light-headedness and headaches.    Social History   Tobacco Use  . Smoking status: Never Smoker  . Smokeless tobacco: Never Used  Substance Use Topics  . Alcohol use: No    Alcohol/week: 0.0 standard drinks      Objective:   BP 128/66 (BP Location: Right Arm, Patient Position: Sitting, Cuff Size: Large)   Pulse (!) 104   Temp 99.2 F (37.3 C) (Oral)   Resp 16   Wt 292 lb (132.5 kg)   BMI 44.40 kg/m  Vitals:   07/05/18 1345  BP: 128/66  Pulse: (!) 104  Resp: 16  Temp: 99.2 F (37.3 C)  TempSrc: Oral  Weight: 292 lb (132.5 kg)     Physical Exam  General Appearance:    Alert, cooperative, no distress, obese  Eyes:    PERRL, conjunctiva/corneas clear, EOM's intact       Lungs:     Clear to auscultation bilaterally, respirations unlabored  Heart:     Irregularly irregular rhythm. Normal rate   Neurologic:   Awake, alert, oriented x 3. No apparent focal neurological           defect.       Results for orders placed or performed in visit on 07/05/18  POCT glycosylated hemoglobin (Hb A1C)  Result Value Ref Range   Hemoglobin A1C 11.3 (A) 4.0 - 5.6 %       Assessment & Plan    1. Uncontrolled type 2 diabetes mellitus with hyperglycemia (HCC) add- dapagliflozin propanediol (FARXIGA) 10 MG TABS tablet; Take 1/2 tablet by mouth every morning  Dispense: 35 tablet; Refill: 0  2. Benign essential HTN Well controlled.  Continue current medications.    3. Colon cancer screening  - Cologuard     The  entirety of the information documented in the History of Present Illness, Review of Systems and Physical Exam were personally obtained by me. Portions of this information were initially documented by Ashley Royalty, CMA and reviewed by me for thoroughness and accuracy.   Lelon Huh, MD  [facility]

## 2018-07-05 NOTE — Patient Instructions (Addendum)
.   Please review the attached list of medications and notify my office if there are any errors.   . Please bring all of your medications to every appointment so we can make sure that our medication list is the same as yours.    Start taking 1/2 tablet Farxiga 41m every morning to improve sugar and help heart function better   Start checking and recording your blood sugar once or twice a week

## 2018-07-06 DIAGNOSIS — E782 Mixed hyperlipidemia: Secondary | ICD-10-CM | POA: Diagnosis not present

## 2018-07-06 DIAGNOSIS — I6523 Occlusion and stenosis of bilateral carotid arteries: Secondary | ICD-10-CM | POA: Diagnosis not present

## 2018-07-06 DIAGNOSIS — I482 Chronic atrial fibrillation, unspecified: Secondary | ICD-10-CM | POA: Diagnosis not present

## 2018-07-06 DIAGNOSIS — I495 Sick sinus syndrome: Secondary | ICD-10-CM | POA: Diagnosis not present

## 2018-07-06 DIAGNOSIS — I10 Essential (primary) hypertension: Secondary | ICD-10-CM | POA: Diagnosis not present

## 2018-07-06 DIAGNOSIS — I5042 Chronic combined systolic (congestive) and diastolic (congestive) heart failure: Secondary | ICD-10-CM | POA: Diagnosis not present

## 2018-07-06 DIAGNOSIS — Z6841 Body Mass Index (BMI) 40.0 and over, adult: Secondary | ICD-10-CM | POA: Diagnosis not present

## 2018-07-06 DIAGNOSIS — I739 Peripheral vascular disease, unspecified: Secondary | ICD-10-CM | POA: Diagnosis not present

## 2018-07-08 DIAGNOSIS — H40003 Preglaucoma, unspecified, bilateral: Secondary | ICD-10-CM | POA: Diagnosis not present

## 2018-07-12 ENCOUNTER — Ambulatory Visit (INDEPENDENT_AMBULATORY_CARE_PROVIDER_SITE_OTHER): Payer: Medicare HMO | Admitting: Family Medicine

## 2018-07-12 ENCOUNTER — Other Ambulatory Visit: Payer: Self-pay

## 2018-07-12 ENCOUNTER — Encounter: Payer: Self-pay | Admitting: Family Medicine

## 2018-07-12 VITALS — BP 132/74 | HR 68 | Temp 98.9°F | Resp 20 | Ht 68.5 in | Wt 290.0 lb

## 2018-07-12 DIAGNOSIS — E11628 Type 2 diabetes mellitus with other skin complications: Secondary | ICD-10-CM

## 2018-07-12 DIAGNOSIS — L565 Disseminated superficial actinic porokeratosis (DSAP): Secondary | ICD-10-CM | POA: Diagnosis not present

## 2018-07-12 DIAGNOSIS — N3001 Acute cystitis with hematuria: Secondary | ICD-10-CM | POA: Diagnosis not present

## 2018-07-12 MED ORDER — AMOXICILLIN 500 MG PO CAPS: 500.0000 mg | ORAL_CAPSULE | Freq: Three times a day (TID) | ORAL | 0 refills | Status: DC

## 2018-07-12 MED ORDER — METFORMIN HCL 1000 MG PO TABS: 1000.0000 mg | ORAL_TABLET | Freq: Two times a day (BID) | ORAL | 0 refills | Status: DC

## 2018-07-12 NOTE — Progress Notes (Signed)
Patient: Christian Berg Male    DOB: 1950/07/11   68 y.o.   MRN: [id number] Visit Date: 07/12/2018  Today's Provider: Vernie Murders, PA   Chief Complaint  Patient presents with  . Urinary Tract Infection   Subjective:    Urinary Tract Infection  This is a new problem. The current episode started yesterday. The problem occurs every urination. The problem has been gradually worsening. The quality of the pain is described as burning. There has been no fever (but does mention that he has chills). Associated symptoms include chills, flank pain, frequency, nausea, sweats and urgency. Pertinent negatives include no hematuria. He has tried acetaminophen for the symptoms. The treatment provided mild relief.    Allergies  Allergen Reactions  . Clindamycin/Lincomycin     Chest discomfort  . Sulfa Antibiotics Hives  . Sulfasalazine Hives     Current Outpatient Medications:  .  aspirin EC 81 MG tablet, Take 81 mg by mouth every evening. , Disp: , Rfl:  .  dapagliflozin propanediol (FARXIGA) 10 MG TABS tablet, Take 1/2 tablet by mouth every morning, Disp: 35 tablet, Rfl: 0 .  furosemide (LASIX) 20 MG tablet, Take 1 tablet (20 mg total) by mouth daily., Disp: 7 tablet, Rfl: 0 .  glipiZIDE (GLUCOTROL) 10 MG tablet, TAKE 1 TABLET BY MOUTH TWICE DAILY. TAKE 30 MINUTES BEFORE A MEAL., Disp: 180 tablet, Rfl: 1 .  lansoprazole (PREVACID) 15 MG capsule, Take 15-30 mg by mouth daily as needed. For heartburn/reflux, Disp: , Rfl:  .  lisinopril (PRINIVIL,ZESTRIL) 20 MG tablet, TAKE 1 TABLET BY MOUTH ONCE DAILY, Disp: 90 tablet, Rfl: 3 .  lovastatin (MEVACOR) 10 MG tablet, Take 2 tablets (20 mg total) by mouth daily., Disp: 180 tablet, Rfl: 4 .  metFORMIN (GLUCOPHAGE) 850 MG tablet, Take 1 tablet (850 mg total) by mouth 2 (two) times daily with a meal., Disp: 180 tablet, Rfl: 0 .  metoprolol tartrate (LOPRESSOR) 100 MG tablet, Take 1 tablet (100 mg total) by mouth at bedtime. (Patient taking  differently: Take 200 mg by mouth 2 (two) times daily. ), Disp: , Rfl:  .  nystatin cream (MYCOSTATIN), Apply 1 application topically 2 (two) times daily. (Patient taking differently: Apply 1 application topically 2 (two) times daily as needed for dry skin. ), Disp: 30 g, Rfl: 0 .  traMADol-acetaminophen (ULTRACET) 37.5-325 MG tablet, Take 2 tablets by mouth 4 (four) times daily., Disp: 30 tablet, Rfl: 0 .  warfarin (COUMADIN) 5 MG tablet, Take 1 tablet (5 mg total) by mouth daily., Disp: , Rfl:  .  warfarin (COUMADIN) 7.5 MG tablet, Take 7.5 mg by mouth daily., Disp: , Rfl:   Review of Systems  Constitutional: Positive for chills.  Gastrointestinal: Positive for nausea.  Genitourinary: Positive for flank pain, frequency and urgency. Negative for hematuria.    Social History   Tobacco Use  . Smoking status: Never Smoker  . Smokeless tobacco: Never Used  Substance Use Topics  . Alcohol use: No    Alcohol/week: 0.0 standard drinks      Objective:   BP 132/74   Pulse 68   Temp 98.9 F (37.2 C)   Resp 20   Ht 5' 8.5" (1.74 m)   Wt 290 lb (131.5 kg)   SpO2 98%   BMI 43.45 kg/m   Wt Readings from Last 3 Encounters:  07/12/18 290 lb (131.5 kg)  07/05/18 292 lb (132.5 kg)  05/04/18 292 lb (132.5 kg)  Vitals:   07/12/18 1416  BP: 132/74  Pulse: 68  Resp: 20  Temp: 98.9 F (37.2 C)  SpO2: 98%  Weight: 290 lb (131.5 kg)  Height: 5' 8.5" (1.74 m)   Physical Exam Constitutional:      General: He is not in acute distress.    Appearance: He is well-developed.  HENT:     Head: Normocephalic and atraumatic.     Right Ear: Hearing normal.     Left Ear: Hearing normal.     Nose: Nose normal.  Eyes:     General: Lids are normal. No scleral icterus.       Right eye: No discharge.        Left eye: No discharge.     Conjunctiva/sclera: Conjunctivae normal.  Neck:     Musculoskeletal: Neck supple.  Cardiovascular:     Rate and Rhythm: Normal rate and regular rhythm.   Pulmonary:     Effort: Pulmonary effort is normal. No respiratory distress.     Breath sounds: Normal breath sounds.  Abdominal:     General: Bowel sounds are normal.     Palpations: Abdomen is soft.     Tenderness: There is no right CVA tenderness or left CVA tenderness.  Musculoskeletal: Normal range of motion.  Skin:    Findings: No lesion or rash.  Neurological:     Mental Status: He is alert and oriented to person, place, and time.  Psychiatric:        Speech: Speech normal.        Behavior: Behavior normal.        Thought Content: Thought content normal.       Assessment & Plan    1. Acute cystitis with hematuria Urge and frequency with difficulty getting to the bathroom due to poor balance and strength. This started yesterday with some burning when urinating. Pyuria on urinalysis and will get C&S. Start antibiotic and stop the [place] that he feels is causing more frequent urination and started this infection. Recheck pending culture report. - CULTURE, URINE COMPREHENSIVE - amoxicillin (AMOXIL) 500 MG capsule; Take 1 capsule (500 mg total) by mouth 3 (three) times daily.  Dispense: 30 capsule; Refill: 0  2. Type 2 diabetes mellitus with other skin complication, without long-term current use of insulin (HCC) FBS averaging 191 at home. [place] causing increase in urination and can't get up quickly enough to get to the bathroom without wetting himself. Urinalysis shows pyuria today with some microscopic hematuria. Denies polydipsia but keeps a glass of water close by all the time. Still taking the Metformin 850 mg BID and Glipizide 10 mg BID. Will stop the [place] and increase the Metformin to 1000 mg BID. Keep appointment for follow up of Hgb A1C that was 11.3 on 07-05-18. - metFORMIN (GLUCOPHAGE) 1000 MG tablet; Take 1 tablet (1,000 mg total) by mouth 2 (two) times daily with a meal.  Dispense: 180 tablet; Refill: 0  3. DSAP (disseminated superficial actinic porokeratosis) Brown  rash with crusting of arms and lower legs. No significant erythema or purulent discharge. Some slight itching.      Vernie Murders, PA  [facility]

## 2018-07-15 ENCOUNTER — Telehealth: Payer: Self-pay

## 2018-07-15 DIAGNOSIS — H401133 Primary open-angle glaucoma, bilateral, severe stage: Secondary | ICD-10-CM | POA: Diagnosis not present

## 2018-07-15 LAB — SPECIMEN STATUS REPORT

## 2018-07-15 LAB — CULTURE, URINE COMPREHENSIVE

## 2018-07-15 NOTE — Telephone Encounter (Signed)
Tried calling; no answer.    Thanks,   -Mickel Baas

## 2018-07-15 NOTE — Telephone Encounter (Signed)
-----   Message from Margo Common, [place] sent at 07/15/2018  2:01 PM EDT ----- A mixed bag of bacteria in a low colony count on urine culture was identified. Take all the antibiotic given and drink extra fluids to flush out urinary tract. Recheck urinalysis in the office if any symptoms remain after finish the antibiotic.

## 2018-07-16 ENCOUNTER — Other Ambulatory Visit: Payer: Self-pay | Admitting: Family Medicine

## 2018-07-16 DIAGNOSIS — E119 Type 2 diabetes mellitus without complications: Secondary | ICD-10-CM

## 2018-07-16 MED ORDER — GLIPIZIDE 10 MG PO TABS: 10.0000 mg | ORAL_TABLET | Freq: Two times a day (BID) | ORAL | 1 refills | Status: DC

## 2018-07-16 NOTE — Telephone Encounter (Signed)
[facility] faxed refill request for the following medications: [address], [employer] [phone]    glipiZIDE (GLUCOTROL) 10 MG tablet   Please advise.  Thanks, [employer]

## 2018-07-20 ENCOUNTER — Other Ambulatory Visit: Payer: Self-pay

## 2018-07-20 ENCOUNTER — Ambulatory Visit (INDEPENDENT_AMBULATORY_CARE_PROVIDER_SITE_OTHER): Payer: Medicare HMO | Admitting: *Deleted

## 2018-07-20 DIAGNOSIS — I482 Chronic atrial fibrillation, unspecified: Secondary | ICD-10-CM | POA: Diagnosis not present

## 2018-07-20 LAB — POCT INR
INR: 2.1 (ref 2.0–3.0)
PT: 25.6

## 2018-07-20 NOTE — Telephone Encounter (Signed)
Patient was advised of results. Expressed understanding.  

## 2018-07-20 NOTE — Patient Instructions (Signed)
Dx: Atrial Fibrillation Coumadin dose: 7.77m daily, except 3.719mon Mon., Wed. & Fridays PT: 25.6 INR: 2.1 Recheck: 3 weeks

## 2018-08-10 ENCOUNTER — Ambulatory Visit (INDEPENDENT_AMBULATORY_CARE_PROVIDER_SITE_OTHER): Payer: Medicare HMO

## 2018-08-10 ENCOUNTER — Other Ambulatory Visit: Payer: Self-pay

## 2018-08-10 DIAGNOSIS — I482 Chronic atrial fibrillation, unspecified: Secondary | ICD-10-CM

## 2018-08-10 LAB — POCT INR
INR: 2.4 (ref 2.0–3.0)
PT: 28.9

## 2018-08-10 NOTE — Patient Instructions (Signed)
Description   Dx: Atrial Fibrillation Coumadin dose: 7.96m daily, except 3.723mon Mon., Wed. & Fridays PT: 28.9 INR: 2.4 Recheck: 4 weeks

## 2018-08-31 DIAGNOSIS — H2512 Age-related nuclear cataract, left eye: Secondary | ICD-10-CM | POA: Diagnosis not present

## 2018-09-06 ENCOUNTER — Ambulatory Visit: Payer: Medicare HMO | Admitting: Family Medicine

## 2018-09-07 ENCOUNTER — Other Ambulatory Visit: Payer: Self-pay

## 2018-09-07 ENCOUNTER — Ambulatory Visit (INDEPENDENT_AMBULATORY_CARE_PROVIDER_SITE_OTHER): Payer: Medicare HMO | Admitting: *Deleted

## 2018-09-07 DIAGNOSIS — I482 Chronic atrial fibrillation, unspecified: Secondary | ICD-10-CM

## 2018-09-07 LAB — POCT INR
INR: 2.3 (ref 2.0–3.0)
PT: 27.3

## 2018-09-07 NOTE — Patient Instructions (Signed)
Dx: Atrial Fibrillation Coumadin dose: 7.79m daily, except 3.763mon Mon., Wed. & Fridays PT: 27.3 INR: 2.3 Recheck: 4 weeks

## 2018-09-22 ENCOUNTER — Ambulatory Visit: Admit: 2018-09-22 | Payer: Medicare HMO | Admitting: Ophthalmology

## 2018-09-22 SURGERY — PHACOEMULSIFICATION, CATARACT, WITH IOL INSERTION
Anesthesia: Topical | Laterality: Left

## 2018-10-05 ENCOUNTER — Other Ambulatory Visit: Payer: Self-pay

## 2018-10-05 ENCOUNTER — Ambulatory Visit (INDEPENDENT_AMBULATORY_CARE_PROVIDER_SITE_OTHER): Payer: Medicare HMO | Admitting: *Deleted

## 2018-10-05 DIAGNOSIS — I482 Chronic atrial fibrillation, unspecified: Secondary | ICD-10-CM | POA: Diagnosis not present

## 2018-10-05 LAB — POCT INR
INR: 2.4 (ref 2.0–3.0)
PT: 28.5

## 2018-10-05 NOTE — Patient Instructions (Signed)
Dx: Atrial Fibrillation Coumadin dose: 7.86m daily, except 3.712mon Mon., Wed. & Fridays PT: 28.5 INR: 2.4 Recheck: 4 weeks

## 2018-10-18 ENCOUNTER — Encounter: Payer: Self-pay | Admitting: Family Medicine

## 2018-10-18 ENCOUNTER — Ambulatory Visit (INDEPENDENT_AMBULATORY_CARE_PROVIDER_SITE_OTHER): Payer: Medicare HMO | Admitting: Family Medicine

## 2018-10-18 ENCOUNTER — Telehealth: Payer: Self-pay

## 2018-10-18 ENCOUNTER — Other Ambulatory Visit: Payer: Self-pay

## 2018-10-18 VITALS — BP 148/94 | HR 120 | Temp 96.9°F | Resp 16 | Ht 68.0 in | Wt 298.2 lb

## 2018-10-18 DIAGNOSIS — I482 Chronic atrial fibrillation, unspecified: Secondary | ICD-10-CM

## 2018-10-18 DIAGNOSIS — I4891 Unspecified atrial fibrillation: Secondary | ICD-10-CM | POA: Diagnosis not present

## 2018-10-18 DIAGNOSIS — L03115 Cellulitis of right lower limb: Secondary | ICD-10-CM

## 2018-10-18 DIAGNOSIS — H2512 Age-related nuclear cataract, left eye: Secondary | ICD-10-CM | POA: Diagnosis not present

## 2018-10-18 LAB — POCT INR
INR: 3.5 — AB (ref 2.0–3.0)
PT: 41.8

## 2018-10-18 MED ORDER — AMOXICILLIN-POT CLAVULANATE 875-125 MG PO TABS: 1.0000 | ORAL_TABLET | Freq: Two times a day (BID) | ORAL | 0 refills | Status: DC

## 2018-10-18 NOTE — Patient Instructions (Signed)
.   Please review the attached list of medications and notify my office if there are any errors.   . Please bring all of your medications to every appointment so we can make sure that our medication list is the same as yours.   . It is especially important to get the annual flu vaccine this year. If you haven't had it already, please go to your pharmacy or call the office as soon as possible to schedule you flu shot.  

## 2018-10-18 NOTE — Progress Notes (Signed)
Patient: Christian Berg Male    DOB: [birth year]   68 y.o.   MRN: [id number] Visit Date: 10/18/2018  Today's Provider: Lelon Huh, MD   Chief Complaint  Patient presents with  . Leg Injury   Subjective:     HPI Patient here today C/O right leg injury x's six days. Patient reports pain, redness and drainage from site. He states he had a scabbed are on his leg which he accidentally scraped off when he lost his balance in the shower and swiped it his other foot. Initially cleaned with peroxide and since has been using neosporin on it daily, but is getting red around area and starting to drain.   He also reports he is having eye surgery next week and needs to have PT/INR checked.   Allergies  Allergen Reactions  . Clindamycin/Lincomycin     Chest discomfort  . Sulfa Antibiotics Hives  . Sulfasalazine Hives     Current Outpatient Medications:  .  aspirin EC 81 MG tablet, Take 81 mg by mouth every evening. , Disp: , Rfl:  .  brimonidine (ALPHAGAN) 0.2 % ophthalmic solution, brimonidine 0.2 % eye drops  INSTILL 1 DROP INTO EACH EYE TWICE DAILY, Disp: , Rfl:  .  glipiZIDE (GLUCOTROL) 10 MG tablet, Take 1 tablet (10 mg total) by mouth 2 (two) times daily before a meal., Disp: 180 tablet, Rfl: 1 .  lisinopril (PRINIVIL,ZESTRIL) 20 MG tablet, TAKE 1 TABLET BY MOUTH ONCE DAILY, Disp: 90 tablet, Rfl: 3 .  lovastatin (MEVACOR) 10 MG tablet, Take 2 tablets (20 mg total) by mouth daily., Disp: 180 tablet, Rfl: 4 .  metFORMIN (GLUCOPHAGE) 1000 MG tablet, Take 1 tablet (1,000 mg total) by mouth 2 (two) times daily with a meal., Disp: 180 tablet, Rfl: 0 .  metoprolol tartrate (LOPRESSOR) 100 MG tablet, Take 1 tablet (100 mg total) by mouth at bedtime. (Patient taking differently: Take 200 mg by mouth 2 (two) times daily. ), Disp: , Rfl:  .  nystatin cream (MYCOSTATIN), Apply 1 application topically 2 (two) times daily. (Patient taking differently: Apply 1 application topically 2 (two)  times daily as needed for dry skin. ), Disp: 30 g, Rfl: 0 .  traMADol-acetaminophen (ULTRACET) 37.5-325 MG tablet, Take 2 tablets by mouth 4 (four) times daily., Disp: 30 tablet, Rfl: 0 .  warfarin (COUMADIN) 7.5 MG tablet, Take 7.5 mg by mouth daily., Disp: , Rfl:  .  warfarin (COUMADIN) 5 MG tablet, Take 1 tablet (5 mg total) by mouth daily., Disp: , Rfl:   Review of Systems  Constitutional: Negative.   Cardiovascular: Negative.   Skin: Positive for color change, rash and wound.    Social History   Tobacco Use  . Smoking status: Never Smoker  . Smokeless tobacco: Never Used  Substance Use Topics  . Alcohol use: No    Alcohol/week: 0.0 standard drinks      Objective:   BP (!) 148/94 (BP Location: Left Arm, Patient Position: Sitting, Cuff Size: Large)   Pulse (!) 120   Temp (!) 96.9 F (36.1 C) (Temporal)   Resp 16   Ht 5' 8"  (1.727 m)   Wt 298 lb 3.2 oz (135.3 kg)   BMI 45.34 kg/m  Vitals:   10/18/18 1546  BP: (!) 148/94  Pulse: (!) 120  Resp: 16  Temp: (!) 96.9 F (36.1 C)  TempSrc: Temporal  Weight: 298 lb 3.2 oz (135.3 kg)  Height: 5' 8"  (1.727 m)  Body mass index is 45.34 kg/m.   Physical Exam     Results for orders placed or performed in visit on 10/18/18  POCT INR  Result Value Ref Range   INR 3.5 (A) 2.0 - 3.0   PT 41.8        Assessment & Plan    1. Cellulitis of right lower extremity  - amoxicillin-clavulanate (AUGMENTIN) 875-125 MG tablet; Take 1 tablet by mouth 2 (two) times daily for 10 days.  Dispense: 20 tablet; Refill: 0 - Aerobic culture  2. Chronic atrial fibrillation His is slightly overly anticoagulated, will hold warfarin two days, then resume at lowered dose of 7.33m MWF and 3.750mall other days.   The entirety of the information documented in the History of Present Illness, Review of Systems and Physical Exam were personally obtained by me. Portions of this information were initially documented by SuLynford HumphreyCMA and  reviewed by me for thoroughness and accuracy.      DoLelon HuhMD  [facility]

## 2018-10-18 NOTE — Telephone Encounter (Signed)
lmtcb patient needs to have pt/inr rechecked on Friday. Patient also needs to hold coumadin for two days then continue with schedule given today.

## 2018-10-19 NOTE — Telephone Encounter (Signed)
Patient advised of instructions as below. I tried to schedule a recheck PT/INR visit for Friday but patient says that someone already scheduled him an appointment to come in Friday at 10:40am. I didn't see where he was scheduled to come in on Friday at 10:40am, so I added him to your schedule for a nurse visit Friday at 10:40am.   Fridays PT/INR result needs to be faxed to : [facility]   [phone]

## 2018-10-19 NOTE — Telephone Encounter (Signed)
Per Laverda Sorenson,  Patient's next PT/INR results needs to be faxed to [phone].

## 2018-10-19 NOTE — Telephone Encounter (Signed)
Tried calling patient. Left message to call back.

## 2018-10-21 ENCOUNTER — Other Ambulatory Visit: Payer: Self-pay

## 2018-10-21 ENCOUNTER — Encounter: Payer: Self-pay | Admitting: *Deleted

## 2018-10-21 LAB — AEROBIC CULTURE

## 2018-10-22 ENCOUNTER — Ambulatory Visit (INDEPENDENT_AMBULATORY_CARE_PROVIDER_SITE_OTHER): Payer: Medicare HMO | Admitting: Family Medicine

## 2018-10-22 ENCOUNTER — Other Ambulatory Visit
Admission: RE | Admit: 2018-10-22 | Discharge: 2018-10-22 | Disposition: A | Discharge status: Home / Self Care | Payer: Medicare HMO | Source: Ambulatory Visit | Attending: Ophthalmology | Admitting: Ophthalmology

## 2018-10-22 DIAGNOSIS — Z20828 Contact with and (suspected) exposure to other viral communicable diseases: Secondary | ICD-10-CM | POA: Insufficient documentation

## 2018-10-22 DIAGNOSIS — I482 Chronic atrial fibrillation, unspecified: Secondary | ICD-10-CM | POA: Diagnosis not present

## 2018-10-22 DIAGNOSIS — Z01812 Encounter for preprocedural laboratory examination: Secondary | ICD-10-CM | POA: Diagnosis not present

## 2018-10-22 LAB — POCT INR
INR: 1.8 — AB (ref 2.0–3.0)
Prothrombin Time: 22

## 2018-10-22 NOTE — Patient Instructions (Signed)
Description   Dx: Atrial Fibrillation Coumadin dose: 7.45m M,W,F except 3.79mon other days Recheck: 3 weeks

## 2018-10-23 LAB — SARS CORONAVIRUS 2 (TAT 6-24 HRS): SARS Coronavirus 2: NEGATIVE

## 2018-10-25 ENCOUNTER — Telehealth: Payer: Self-pay | Admitting: Family Medicine

## 2018-10-25 ENCOUNTER — Telehealth: Payer: Self-pay | Admitting: *Deleted

## 2018-10-25 NOTE — Telephone Encounter (Signed)
refaxing documentation again, both fax transmissions went through earlier at number [phone]. Will send again.KW

## 2018-10-25 NOTE — Telephone Encounter (Signed)
Dorian Pod with [facility] would like [place] to fax patients last PT/INR    [phone]  [facility]

## 2018-10-25 NOTE — Discharge Instructions (Signed)
General Anesthesia, Adult, Care After °This sheet gives you information about how to care for yourself after your procedure. Your health care provider may also give you more specific instructions. If you have problems or questions, contact your health care provider. °What can I expect after the procedure? °After the procedure, the following side effects are common: °· Pain or discomfort at the IV site. °· Nausea. °· Vomiting. °· Sore throat. °· Trouble concentrating. °· Feeling cold or chills. °· Weak or tired. °· Sleepiness and fatigue. °· Soreness and body aches. These side effects can affect parts of the body that were not involved in surgery. °Follow these instructions at home: ° °For at least 24 hours after the procedure: °· Have a responsible adult stay with you. It is important to have someone help care for you until you are awake and alert. °· Rest as needed. °· Do not: °? Participate in activities in which you could fall or become injured. °? Drive. °? Use heavy machinery. °? Drink alcohol. °? Take sleeping pills or medicines that cause drowsiness. °? Make important decisions or sign legal documents. °? Take care of children on your own. °Eating and drinking °· Follow any instructions from your health care provider about eating or drinking restrictions. °· When you feel hungry, start by eating small amounts of foods that are soft and easy to digest (bland), such as toast. Gradually return to your regular diet. °· Drink enough fluid to keep your urine pale yellow. °· If you vomit, rehydrate by drinking water, juice, or clear broth. °General instructions °· If you have sleep apnea, surgery and certain medicines can increase your risk for breathing problems. Follow instructions from your health care provider about wearing your sleep device: °? Anytime you are sleeping, including during daytime naps. °? While taking prescription pain medicines, sleeping medicines, or medicines that make you drowsy. °· Return to  your normal activities as told by your health care provider. Ask your health care provider what activities are safe for you. °· Take over-the-counter and prescription medicines only as told by your health care provider. °· If you smoke, do not smoke without supervision. °· Keep all follow-up visits as told by your health care provider. This is important. °Contact a health care provider if: °· You have nausea or vomiting that does not get better with medicine. °· You cannot eat or drink without vomiting. °· You have pain that does not get better with medicine. °· You are unable to pass urine. °· You develop a skin rash. °· You have a fever. °· You have redness around your IV site that gets worse. °Get help right away if: °· You have difficulty breathing. °· You have chest pain. °· You have blood in your urine or stool, or you vomit blood. °Summary °· After the procedure, it is common to have a sore throat or nausea. It is also common to feel tired. °· Have a responsible adult stay with you for the first 24 hours after general anesthesia. It is important to have someone help care for you until you are awake and alert. °· When you feel hungry, start by eating small amounts of foods that are soft and easy to digest (bland), such as toast. Gradually return to your regular diet. °· Drink enough fluid to keep your urine pale yellow. °· Return to your normal activities as told by your health care provider. Ask your health care provider what activities are safe for you. °This information is not   intended to replace advice given to you by your health care provider. Make sure you discuss any questions you have with your health care provider. °Document Released: 04/21/2000 Document Revised: 01/16/2017 Document Reviewed: 08/29/2016 °Elsevier Patient Education © 2020 [employer] °Cataract Surgery, Care After °This sheet gives you information about how to care for yourself after your procedure. Your health care provider may also  give you more specific instructions. If you have problems or questions, contact your health care provider. °What can I expect after the procedure? °After the procedure, it is common to have: °· Itching. °· Discomfort. °· Fluid discharge. °· Sensitivity to light and to touch. °· Bruising in or around the eye. °· Mild blurred vision. °Follow these instructions at home: °Eye care ° °· Do not touch or rub your eyes. °· Protect your eyes as told by your health care provider. You may be told to wear a protective eye shield or sunglasses. °· Do not put a contact lens into the affected eye or eyes until your health care provider approves. °· Keep the area around your eye clean and dry: °? Avoid swimming. °? Do not allow water to hit you directly in the face while showering. °? Keep soap and shampoo out of your eyes. °· Check your eye every day for signs of infection. Watch for: °? Redness, swelling, or pain. °? Fluid, blood, or pus. °? Warmth. °? A bad smell. °? Vision that is getting worse. °? Sensitivity that is getting worse. °Activity °· Do not drive for 24 hours if you were given a sedative during your procedure. °· Avoid strenuous activities, such as playing contact sports, for as long as told by your health care provider. °· Do not drive or use heavy machinery until your health care provider approves. °· Do not bend or lift heavy objects. Bending increases pressure in the eye. You can walk, climb stairs, and do light household chores. °· Ask your health care provider when you can return to work. If you work in a dusty environment, you may be advised to wear protective eyewear for a period of time. °General instructions °· Take or apply over-the-counter and prescription medicines only as told by your health care provider. This includes eye drops. °· Keep all follow-up visits as told by your health care provider. This is important. °Contact a health care provider if: °· You have increased bruising around your  eye. °· You have pain that is not helped with medicine. °· You have a fever. °· You have redness, swelling, or pain in your eye. °· You have fluid, blood, or pus coming from your incision. °· Your vision gets worse. °· Your sensitivity to light gets worse. °Get help right away if: °· You have sudden loss of vision. °· You see flashes of light or spots (floaters). °· You have severe eye pain. °· You develop nausea or vomiting. °Summary °· After your procedure, it is common to have itching, discomfort, bruising, fluid discharge, or sensitivity to light. °· Follow instructions from your health care provider about caring for your eye after the procedure. °· Do not rub your eye after the procedure. You may need to wear eye protection or sunglasses. Do not wear contact lenses. Keep the area around your eye clean and dry. °· Avoid activities that require a lot of effort. These include playing sports and lifting heavy objects. °· Contact a health care provider if you have increased bruising, pain that does not go away, or a fever. Get   help right away if you suddenly lose your vision, see flashes of light or spots, or have severe pain in the eye. °This information is not intended to replace advice given to you by your health care provider. Make sure you discuss any questions you have with your health care provider. °Document Released: 08/02/2004 Document Revised: 07/13/2017 Document Reviewed: 07/13/2017 °Elsevier Patient Education © 2020 [employer] ° °

## 2018-10-25 NOTE — Telephone Encounter (Signed)
[facility] called and stated they did not receive requested PT/INR result. Gave new fax [phone] or [phone]

## 2018-10-25 NOTE — Telephone Encounter (Signed)
Faxed again to eye center. KW

## 2018-10-27 ENCOUNTER — Other Ambulatory Visit: Payer: Self-pay

## 2018-10-27 ENCOUNTER — Ambulatory Visit: Payer: Medicare HMO | Admitting: Anesthesiology

## 2018-10-27 ENCOUNTER — Ambulatory Visit
Admission: RE | Admit: 2018-10-27 | Discharge: 2018-10-27 | Disposition: A | Discharge status: Home / Self Care | Payer: Medicare HMO | Attending: Ophthalmology | Admitting: Ophthalmology

## 2018-10-27 ENCOUNTER — Encounter
Admission: RE | Disposition: A | Discharge status: Home / Self Care | Payer: Self-pay | Source: Home / Self Care | Attending: Ophthalmology

## 2018-10-27 DIAGNOSIS — Z7982 Long term (current) use of aspirin: Secondary | ICD-10-CM | POA: Diagnosis not present

## 2018-10-27 DIAGNOSIS — E1136 Type 2 diabetes mellitus with diabetic cataract: Secondary | ICD-10-CM | POA: Insufficient documentation

## 2018-10-27 DIAGNOSIS — Z7984 Long term (current) use of oral hypoglycemic drugs: Secondary | ICD-10-CM | POA: Diagnosis not present

## 2018-10-27 DIAGNOSIS — Z950 Presence of cardiac pacemaker: Secondary | ICD-10-CM | POA: Diagnosis not present

## 2018-10-27 DIAGNOSIS — Z9841 Cataract extraction status, right eye: Secondary | ICD-10-CM | POA: Insufficient documentation

## 2018-10-27 DIAGNOSIS — I110 Hypertensive heart disease with heart failure: Secondary | ICD-10-CM | POA: Diagnosis not present

## 2018-10-27 DIAGNOSIS — J45909 Unspecified asthma, uncomplicated: Secondary | ICD-10-CM | POA: Insufficient documentation

## 2018-10-27 DIAGNOSIS — I6529 Occlusion and stenosis of unspecified carotid artery: Secondary | ICD-10-CM | POA: Insufficient documentation

## 2018-10-27 DIAGNOSIS — E7800 Pure hypercholesterolemia, unspecified: Secondary | ICD-10-CM | POA: Insufficient documentation

## 2018-10-27 DIAGNOSIS — Z79899 Other long term (current) drug therapy: Secondary | ICD-10-CM | POA: Insufficient documentation

## 2018-10-27 DIAGNOSIS — H2512 Age-related nuclear cataract, left eye: Secondary | ICD-10-CM | POA: Diagnosis not present

## 2018-10-27 DIAGNOSIS — Z7901 Long term (current) use of anticoagulants: Secondary | ICD-10-CM | POA: Diagnosis not present

## 2018-10-27 DIAGNOSIS — K760 Fatty (change of) liver, not elsewhere classified: Secondary | ICD-10-CM | POA: Diagnosis not present

## 2018-10-27 DIAGNOSIS — I4819 Other persistent atrial fibrillation: Secondary | ICD-10-CM | POA: Insufficient documentation

## 2018-10-27 DIAGNOSIS — L565 Disseminated superficial actinic porokeratosis (DSAP): Secondary | ICD-10-CM | POA: Insufficient documentation

## 2018-10-27 DIAGNOSIS — I509 Heart failure, unspecified: Secondary | ICD-10-CM | POA: Insufficient documentation

## 2018-10-27 DIAGNOSIS — H25812 Combined forms of age-related cataract, left eye: Secondary | ICD-10-CM | POA: Diagnosis not present

## 2018-10-27 DIAGNOSIS — Z6841 Body Mass Index (BMI) 40.0 and over, adult: Secondary | ICD-10-CM | POA: Diagnosis not present

## 2018-10-27 HISTORY — DX: Presence of cardiac pacemaker: Z95.0

## 2018-10-27 HISTORY — DX: Deficiency of other specified B group vitamins: E53.8

## 2018-10-27 HISTORY — DX: Personal history of other diseases of the musculoskeletal system and connective tissue: Z87.39

## 2018-10-27 HISTORY — DX: Personal history of urinary calculi: Z87.442

## 2018-10-27 HISTORY — PX: CATARACT EXTRACTION W/PHACO: SHX586

## 2018-10-27 HISTORY — DX: Fatty (change of) liver, not elsewhere classified: K76.0

## 2018-10-27 HISTORY — DX: Occlusion and stenosis of unspecified carotid artery: I65.29

## 2018-10-27 HISTORY — DX: Obesity, unspecified: E66.9

## 2018-10-27 LAB — GLUCOSE, CAPILLARY
Glucose-Capillary: 159 mg/dL — ABNORMAL HIGH (ref 70–99)
Glucose-Capillary: 170 mg/dL — ABNORMAL HIGH (ref 70–99)

## 2018-10-27 SURGERY — PHACOEMULSIFICATION, CATARACT, WITH IOL INSERTION
Anesthesia: Monitor Anesthesia Care | Site: Eye | Laterality: Left

## 2018-10-27 MED ORDER — BRIMONIDINE TARTRATE-TIMOLOL 0.2-0.5 % OP SOLN
OPHTHALMIC | Status: DC | PRN
  Administered 2018-10-27: 1 [drp] via OPHTHALMIC

## 2018-10-27 MED ORDER — EPINEPHRINE PF 1 MG/ML IJ SOLN
INTRAOCULAR | Status: DC | PRN
  Administered 2018-10-27: 59 mL via OPHTHALMIC

## 2018-10-27 MED ORDER — MOXIFLOXACIN HCL 0.5 % OP SOLN
1.0000 [drp] | OPHTHALMIC | Status: DC | PRN
  Administered 2018-10-27 (×3): 1 [drp] via OPHTHALMIC

## 2018-10-27 MED ORDER — MIDAZOLAM HCL 2 MG/2ML IJ SOLN
INTRAMUSCULAR | Status: DC | PRN
  Administered 2018-10-27: 2 mg via INTRAVENOUS

## 2018-10-27 MED ORDER — LIDOCAINE HCL (PF) 2 % IJ SOLN
INTRAOCULAR | Status: DC | PRN
  Administered 2018-10-27: 1 mL

## 2018-10-27 MED ORDER — FENTANYL CITRATE (PF) 100 MCG/2ML IJ SOLN
INTRAMUSCULAR | Status: DC | PRN
  Administered 2018-10-27 (×3): 50 ug via INTRAVENOUS

## 2018-10-27 MED ORDER — NA HYALUR & NA CHOND-NA HYALUR 0.4-0.35 ML IO KIT
PACK | INTRAOCULAR | Status: DC | PRN
  Administered 2018-10-27: 1 mL via INTRAOCULAR

## 2018-10-27 MED ORDER — LABETALOL HCL 5 MG/ML IV SOLN
INTRAVENOUS | Status: DC | PRN
  Administered 2018-10-27: 5 mg via INTRAVENOUS

## 2018-10-27 MED ORDER — CEFUROXIME OPHTHALMIC INJECTION 1 MG/0.1 ML
INJECTION | OPHTHALMIC | Status: DC | PRN
  Administered 2018-10-27: 0.1 mL via INTRACAMERAL

## 2018-10-27 MED ORDER — ARMC OPHTHALMIC DILATING DROPS
1.0000 "application " | OPHTHALMIC | Status: DC | PRN
  Administered 2018-10-27 (×3): 1 via OPHTHALMIC

## 2018-10-27 MED ORDER — TETRACAINE HCL 0.5 % OP SOLN
1.0000 [drp] | OPHTHALMIC | Status: DC | PRN
  Administered 2018-10-27 (×3): 1 [drp] via OPHTHALMIC

## 2018-10-27 MED ORDER — LACTATED RINGERS IV SOLN: INTRAVENOUS | Status: DC

## 2018-10-27 SURGICAL SUPPLY — 16 items
CANNULA ANT/CHMB 27G (MISCELLANEOUS) ×1 IMPLANT
CANNULA ANT/CHMB 27GA (MISCELLANEOUS) ×2 IMPLANT
GLOVE SURG LX 7.5 STRW (GLOVE) ×1
GLOVE SURG LX STRL 7.5 STRW (GLOVE) ×1 IMPLANT
GLOVE SURG TRIUMPH 8.0 PF LTX (GLOVE) ×2 IMPLANT
GOWN STRL REUS W/ TWL LRG LVL3 (GOWN DISPOSABLE) ×2 IMPLANT
GOWN STRL REUS W/TWL LRG LVL3 (GOWN DISPOSABLE) ×2
LENS IOL TECNIS ITEC 21.5 (Intraocular Lens) ×1 IMPLANT
MARKER SKIN DUAL TIP RULER LAB (MISCELLANEOUS) ×2 IMPLANT
PACK CATARACT BRASINGTON (MISCELLANEOUS) ×2 IMPLANT
PACK EYE AFTER SURG (MISCELLANEOUS) ×2 IMPLANT
PACK OPTHALMIC (MISCELLANEOUS) ×2 IMPLANT
SYR 3ML LL SCALE MARK (SYRINGE) ×2 IMPLANT
SYR TB 1ML LUER SLIP (SYRINGE) ×2 IMPLANT
WATER STERILE IRR 500ML POUR (IV SOLUTION) ×2 IMPLANT
WIPE NON LINTING 3.25X3.25 (MISCELLANEOUS) ×2 IMPLANT

## 2018-10-27 NOTE — Op Note (Signed)
OPERATIVE NOTE  Christian Berg [id number] 10/27/2018   PREOPERATIVE DIAGNOSIS:  Nuclear sclerotic cataract left eye. H25.12   POSTOPERATIVE DIAGNOSIS:    Nuclear sclerotic cataract left eye.     PROCEDURE:  Phacoemusification with posterior chamber intraocular lens placement of the left eye  Ultrasound time: Procedure(s) with comments: CATARACT EXTRACTION PHACO AND INTRAOCULAR LENS PLACEMENT (IOC) LEFT DIABETIC  00:46.4  18.6%  8.63 (Left) - Diabetic - oral meds  LENS:   Implant Name Type Inv. Item Serial No. Manufacturer Lot No. LRB No. Used Action  LENS IOL DIOP 21.5 - [id number] Intraocular Lens LENS IOL DIOP 21.5 [id number]  Left 1 Implanted      SURGEON:  Wyonia Hough, MD   ANESTHESIA:  Topical with tetracaine drops and 2% Xylocaine jelly, augmented with 1% preservative-free intracameral lidocaine.    COMPLICATIONS:  None.   DESCRIPTION OF PROCEDURE:  The patient was identified in the holding room and transported to the operating room and placed in the supine position under the operating microscope.  The left eye was identified as the operative eye and it was prepped and draped in the usual sterile ophthalmic fashion.   A 1 millimeter clear-corneal paracentesis was made at the 1:30 position.  0.5 ml of preservative-free 1% lidocaine was injected into the anterior chamber.  The anterior chamber was filled with Viscoat viscoelastic.  A 2.4 millimeter keratome was used to make a near-clear corneal incision at the 10:30 position.  .  A curvilinear capsulorrhexis was made with a cystotome and capsulorrhexis forceps.  Balanced salt solution was used to hydrodissect and hydrodelineate the nucleus.   Phacoemulsification was then used in stop and chop fashion to remove the lens nucleus and epinucleus.  The remaining cortex was then removed using the irrigation and aspiration handpiece. Provisc was then placed into the capsular bag to distend it for lens placement.  A lens  was then injected into the capsular bag.  The remaining viscoelastic was aspirated.   Wounds were hydrated with balanced salt solution.  The anterior chamber was inflated to a physiologic pressure with balanced salt solution.  No wound leaks were noted. Cefuroxime 0.1 ml of a 87m/ml solution was injected into the anterior chamber for a dose of 1 mg of intracameral antibiotic at the completion of the case.   Timolol and Brimonidine drops were applied to the eye.  The patient was taken to the recovery room in stable condition without complications of anesthesia or surgery.  Christian Berg 10/27/2018, 7:54 AM

## 2018-10-27 NOTE — Anesthesia Postprocedure Evaluation (Signed)
Anesthesia Post Note  Patient: Christian Berg  Procedure(s) Performed: CATARACT EXTRACTION PHACO AND INTRAOCULAR LENS PLACEMENT (IOC) LEFT DIABETIC  00:46.4  18.6%  8.63 (Left Eye)  Patient location during evaluation: PACU Anesthesia Type: MAC Level of consciousness: awake and alert and oriented Pain management: satisfactory to patient Vital Signs Assessment: post-procedure vital signs reviewed and stable Respiratory status: spontaneous breathing, nonlabored ventilation and respiratory function stable Cardiovascular status: blood pressure returned to baseline and stable Postop Assessment: Adequate PO intake and No signs of nausea or vomiting Anesthetic complications: no    Raliegh Ip

## 2018-10-27 NOTE — Transfer of Care (Signed)
Immediate Anesthesia Transfer of Care Note  Patient: Christian Berg  Procedure(s) Performed: CATARACT EXTRACTION PHACO AND INTRAOCULAR LENS PLACEMENT (IOC) LEFT DIABETIC  00:46.4  18.6%  8.63 (Left Eye)  Patient Location: PACU  Anesthesia Type: MAC  Level of Consciousness: awake, alert  and patient cooperative  Airway and Oxygen Therapy: Patient Spontanous Breathing and Patient connected to supplemental oxygen  Post-op Assessment: Post-op Vital signs reviewed, Patient's Cardiovascular Status Stable, Respiratory Function Stable, Patent Airway and No signs of Nausea or vomiting  Post-op Vital Signs: Reviewed and stable  Complications: No apparent anesthesia complications

## 2018-10-27 NOTE — Anesthesia Preprocedure Evaluation (Addendum)
Anesthesia Evaluation  Patient identified by MRN, date of birth, ID band Patient awake    Reviewed: Allergy & Precautions, H&P , NPO status , Patient's Chart, lab work & pertinent test results  Airway Mallampati: II  TM Distance: >3 FB Neck ROM: full    Dental no notable dental hx.    Pulmonary asthma ,    Pulmonary exam normal breath sounds clear to auscultation       Cardiovascular hypertension, +CHF  + dysrhythmias Atrial Fibrillation  Rhythm:regular Rate:Normal     Neuro/Psych CVA    GI/Hepatic (+) Hepatitis -  Endo/Other  diabetesMorbid obesity  Renal/GU      Musculoskeletal   Abdominal   Peds  Hematology   Anesthesia Other Findings   Reproductive/Obstetrics                            Anesthesia Physical Anesthesia Plan  ASA: IV  Anesthesia Plan: MAC   Post-op Pain Management:    Induction:   PONV Risk Score and Plan: 1 and Midazolam and Treatment may vary due to age or medical condition  Airway Management Planned:   Additional Equipment:   Intra-op Plan:   Post-operative Plan:   Informed Consent: I have reviewed the patients History and Physical, chart, labs and discussed the procedure including the risks, benefits and alternatives for the proposed anesthesia with the patient or authorized representative who has indicated his/her understanding and acceptance.       Plan Discussed with: CRNA  Anesthesia Plan Comments:         Anesthesia Quick Evaluation

## 2018-10-27 NOTE — H&P (Signed)

## 2018-11-02 ENCOUNTER — Ambulatory Visit: Payer: Self-pay

## 2018-11-06 ENCOUNTER — Telehealth: Payer: Self-pay | Admitting: Family Medicine

## 2018-11-06 NOTE — Telephone Encounter (Signed)
Patient was prescribed augmented for cellulitis of foot a few weeks ago. Please check and make sure his foot is better and doesn't need anymore antibiotic.

## 2018-11-08 ENCOUNTER — Other Ambulatory Visit: Payer: Self-pay | Admitting: Family Medicine

## 2018-11-08 DIAGNOSIS — I482 Chronic atrial fibrillation, unspecified: Secondary | ICD-10-CM

## 2018-11-08 MED ORDER — WARFARIN SODIUM 7.5 MG PO TABS: 7.5000 mg | ORAL_TABLET | Freq: Every day | ORAL | 5 refills | Status: DC

## 2018-11-08 NOTE — Telephone Encounter (Signed)
[id number] 

## 2018-11-08 NOTE — Telephone Encounter (Signed)
[facility] faxed refill request for the following medications:  warfarin (COUMADIN) 7.5 MG tablet   Please advise.

## 2018-11-10 ENCOUNTER — Other Ambulatory Visit: Payer: Self-pay

## 2018-11-10 ENCOUNTER — Ambulatory Visit (INDEPENDENT_AMBULATORY_CARE_PROVIDER_SITE_OTHER): Payer: Medicare HMO

## 2018-11-10 DIAGNOSIS — I482 Chronic atrial fibrillation, unspecified: Secondary | ICD-10-CM

## 2018-11-10 LAB — POCT INR
INR: 2.1 (ref 2.0–3.0)
PT: 24.9

## 2018-11-10 NOTE — Telephone Encounter (Signed)
[id number] 

## 2018-11-10 NOTE — Patient Instructions (Signed)
Description   Dx: Atrial Fibrillation Coumadin dose: 7.61m M,W,F except 3.720mon other days Recheck: 4 weeks

## 2018-11-15 NOTE — Telephone Encounter (Signed)
Patient states the antibiotics worked well. His symptoms have resolved. He denies any redness or swelling of the foot.

## 2018-11-16 ENCOUNTER — Other Ambulatory Visit: Payer: Self-pay | Admitting: Family Medicine

## 2018-11-16 DIAGNOSIS — E11628 Type 2 diabetes mellitus with other skin complications: Secondary | ICD-10-CM

## 2018-11-22 DIAGNOSIS — M9903 Segmental and somatic dysfunction of lumbar region: Secondary | ICD-10-CM | POA: Diagnosis not present

## 2018-11-22 DIAGNOSIS — M5136 Other intervertebral disc degeneration, lumbar region: Secondary | ICD-10-CM | POA: Diagnosis not present

## 2018-11-22 DIAGNOSIS — M9905 Segmental and somatic dysfunction of pelvic region: Secondary | ICD-10-CM | POA: Diagnosis not present

## 2018-11-22 DIAGNOSIS — M5417 Radiculopathy, lumbosacral region: Secondary | ICD-10-CM | POA: Diagnosis not present

## 2018-11-23 DIAGNOSIS — M5136 Other intervertebral disc degeneration, lumbar region: Secondary | ICD-10-CM | POA: Diagnosis not present

## 2018-11-23 DIAGNOSIS — M9903 Segmental and somatic dysfunction of lumbar region: Secondary | ICD-10-CM | POA: Diagnosis not present

## 2018-11-23 DIAGNOSIS — M9905 Segmental and somatic dysfunction of pelvic region: Secondary | ICD-10-CM | POA: Diagnosis not present

## 2018-11-23 DIAGNOSIS — M5417 Radiculopathy, lumbosacral region: Secondary | ICD-10-CM | POA: Diagnosis not present

## 2018-11-24 DIAGNOSIS — M5136 Other intervertebral disc degeneration, lumbar region: Secondary | ICD-10-CM | POA: Diagnosis not present

## 2018-11-24 DIAGNOSIS — M9903 Segmental and somatic dysfunction of lumbar region: Secondary | ICD-10-CM | POA: Diagnosis not present

## 2018-11-24 DIAGNOSIS — M5417 Radiculopathy, lumbosacral region: Secondary | ICD-10-CM | POA: Diagnosis not present

## 2018-11-24 DIAGNOSIS — M9905 Segmental and somatic dysfunction of pelvic region: Secondary | ICD-10-CM | POA: Diagnosis not present

## 2018-11-26 DIAGNOSIS — M5136 Other intervertebral disc degeneration, lumbar region: Secondary | ICD-10-CM | POA: Diagnosis not present

## 2018-11-26 DIAGNOSIS — M9905 Segmental and somatic dysfunction of pelvic region: Secondary | ICD-10-CM | POA: Diagnosis not present

## 2018-11-26 DIAGNOSIS — M9903 Segmental and somatic dysfunction of lumbar region: Secondary | ICD-10-CM | POA: Diagnosis not present

## 2018-11-26 DIAGNOSIS — M5417 Radiculopathy, lumbosacral region: Secondary | ICD-10-CM | POA: Diagnosis not present

## 2018-12-03 ENCOUNTER — Encounter: Payer: Self-pay | Admitting: Intensive Care

## 2018-12-03 ENCOUNTER — Telehealth: Payer: Self-pay | Admitting: Family Medicine

## 2018-12-03 ENCOUNTER — Other Ambulatory Visit: Payer: Self-pay

## 2018-12-03 ENCOUNTER — Emergency Department: Payer: Medicare HMO

## 2018-12-03 ENCOUNTER — Emergency Department
Admission: EM | Admit: 2018-12-03 | Discharge: 2018-12-03 | Disposition: A | Discharge status: Home / Self Care | Payer: Medicare HMO | Attending: Emergency Medicine | Admitting: Emergency Medicine

## 2018-12-03 DIAGNOSIS — R252 Cramp and spasm: Secondary | ICD-10-CM | POA: Insufficient documentation

## 2018-12-03 DIAGNOSIS — Z7982 Long term (current) use of aspirin: Secondary | ICD-10-CM | POA: Diagnosis not present

## 2018-12-03 DIAGNOSIS — M79604 Pain in right leg: Secondary | ICD-10-CM

## 2018-12-03 DIAGNOSIS — I509 Heart failure, unspecified: Secondary | ICD-10-CM | POA: Insufficient documentation

## 2018-12-03 DIAGNOSIS — M79651 Pain in right thigh: Secondary | ICD-10-CM | POA: Diagnosis not present

## 2018-12-03 DIAGNOSIS — I110 Hypertensive heart disease with heart failure: Secondary | ICD-10-CM | POA: Insufficient documentation

## 2018-12-03 DIAGNOSIS — L03115 Cellulitis of right lower limb: Secondary | ICD-10-CM | POA: Diagnosis not present

## 2018-12-03 DIAGNOSIS — Z7984 Long term (current) use of oral hypoglycemic drugs: Secondary | ICD-10-CM | POA: Diagnosis not present

## 2018-12-03 DIAGNOSIS — Z7901 Long term (current) use of anticoagulants: Secondary | ICD-10-CM | POA: Insufficient documentation

## 2018-12-03 DIAGNOSIS — Z79899 Other long term (current) drug therapy: Secondary | ICD-10-CM | POA: Insufficient documentation

## 2018-12-03 DIAGNOSIS — Z950 Presence of cardiac pacemaker: Secondary | ICD-10-CM | POA: Insufficient documentation

## 2018-12-03 DIAGNOSIS — E119 Type 2 diabetes mellitus without complications: Secondary | ICD-10-CM | POA: Diagnosis not present

## 2018-12-03 DIAGNOSIS — I4891 Unspecified atrial fibrillation: Secondary | ICD-10-CM | POA: Diagnosis not present

## 2018-12-03 MED ORDER — CYCLOBENZAPRINE HCL 10 MG PO TABS
10.0000 mg | ORAL_TABLET | Freq: Once | ORAL | Status: AC
  Administered 2018-12-03: 10 mg via ORAL
  Filled 2018-12-03: qty 1

## 2018-12-03 MED ORDER — LIDOCAINE 5 % EX PTCH: 1.0000 | MEDICATED_PATCH | Freq: Two times a day (BID) | CUTANEOUS | 0 refills | Status: DC

## 2018-12-03 MED ORDER — MUPIROCIN CALCIUM 2 % EX CREA: TOPICAL_CREAM | CUTANEOUS | 0 refills | Status: DC

## 2018-12-03 MED ORDER — LIDOCAINE 5 % EX PTCH
2.0000 | MEDICATED_PATCH | CUTANEOUS | Status: DC
  Administered 2018-12-03: 2 via TRANSDERMAL
  Filled 2018-12-03: qty 2

## 2018-12-03 NOTE — Discharge Instructions (Signed)
Ultrasound today was negative for DVT.  Follow discharge care instruction and take medications as directed.

## 2018-12-03 NOTE — ED Provider Notes (Signed)
[facility] Emergency Department Provider Note   ____________________________________________   First MD Initiated Contact with Patient 12/03/18 1158     (approximate)  I have reviewed the triage vital signs and the nursing notes.   HISTORY  Chief Complaint Leg Pain    HPI Christian Berg is a 68 y.o. male patient complain of right lower leg pain for 4 days.  Patient's was concerned for DVT.  Patient states no obvious swelling.  Patient states redness to the right lower leg and pain increased with ambulation.  Patient the pain goes from the mid lower leg to the right hip.  Patient rates the pain as a 9/10.  Patient did pain is not relieved with extended relief tramadol.  Patient denies shortness of breath or chest pain with complaint.  Patient relates a history of CHF.         Past Medical History:  Diagnosis Date  . A-fib ([place])   . Carotid stenosis   . CHF (congestive heart failure) ([place])   . Degenerative disc disease, lumbar    s/p injury  . Diabetes mellitus without complication ([place])   . Disseminated superficial actinic porokeratosis   . Dysrhythmia    A-FIB, palpatations  . Fatty liver   . History of degenerative disc disease   . History of kidney stones   . Hypercholesteremia   . Hypertension   . Kidney stones   . Obesity   . Pacemaker    inactive  . Peripheral vascular disease ([place])    Carotid stenosis  . Presence of permanent cardiac pacemaker    Inactive  . TIA (transient ischemic attack) 2011   No deficits  . TIA (transient ischemic attack)   . Vitamin B12 deficiency     Patient Active Problem List   Diagnosis Date Noted  . 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 NASH (nonalcoholic steatohepatitis) 10/22/2015  . Ischemic embolic stroke ([place]) 46/27/0350  . Peripheral vascular disease ([place]) 12/15/2014  . Morbid (severe) obesity due to excess calories ([place]) 11/22/2014  . VFD (visual field defect) 11/22/2014  . History of TIA  (transient ischemic attack) 10/31/2014  . Kidney stones 07/24/2014  . Other cirrhosis of liver ([place]) 07/24/2014  . DSAP (disseminated superficial actinic porokeratosis) 07/17/2014  . Asthma 05/31/2014  . Chronic diastolic heart failure ([place]) 05/31/2014  . Diabetes mellitus ([place]) 05/31/2014  . Hypercholesteremia 05/31/2014  . B12 deficiency 05/31/2014  . Carotid artery narrowing 02/15/2014  . Bilateral carotid artery stenosis 02/15/2014  . Chronic atrial fibrillation ([place]) 10/24/2013  . Benign prostatic hyperplasia with urinary obstruction 09/06/2012  . Heart disease 05/29/2009  . Benign essential HTN 09/29/2008  . Narrowing of intervertebral disc space 01/11/2007  . Atrial fibrillation with RVR ([place]) 12/07/2006    Past Surgical History:  Procedure Laterality Date  . APPENDECTOMY  2004  . CARDIAC CATHETERIZATION    . CARDIAC PACEMAKER PLACEMENT  2005  . CATARACT EXTRACTION W/PHACO Right 06/14/2014   Procedure: CATARACT EXTRACTION PHACO AND INTRAOCULAR LENS PLACEMENT (IOC);  Surgeon: Leandrew Koyanagi, MD;  Location: [facility];  Service: Ophthalmology;  Laterality: Right;  . CATARACT EXTRACTION W/PHACO Left 10/27/2018   Procedure: CATARACT EXTRACTION PHACO AND INTRAOCULAR LENS PLACEMENT (IOC) LEFT DIABETIC  00:46.4  18.6%  8.63;  Surgeon: Leandrew Koyanagi, MD;  Location: [facility];  Service: Ophthalmology;  Laterality: Left;  Diabetic - oral meds  . CYSTOSCOPY W/ RETROGRADES Bilateral 08/09/2014   Procedure: CYSTOSCOPY WITH RETROGRADE PYELOGRAM;  Surgeon: Hollice Espy, MD;  Location:  [facility];  Service: Urology;  Laterality: Bilateral;  . INTUBATION-ENDOTRACHEAL WITH TRACHEOSTOMY STANDBY  03/30/2017   Procedure: INTUBATION-ENDOTRACHEAL WITH TRACHEOSTOMY STANDBY;  Surgeon: Carloyn Manner, MD;  Location: [facility];  Service: ENT;;    Prior to Admission medications   Medication Sig Start Date End Date Taking? Authorizing Provider  aspirin EC 81 MG tablet Take  81 mg by mouth every evening.     [provider]  brimonidine (ALPHAGAN) 0.2 % ophthalmic solution brimonidine 0.2 % eye drops  INSTILL 1 DROP INTO EACH EYE TWICE DAILY    [provider]  glipiZIDE (GLUCOTROL) 10 MG tablet Take 1 tablet (10 mg total) by mouth 2 (two) times daily before a meal. 07/16/18   Fisher, Kirstie Peri, MD  lidocaine (LIDODERM) 5 % Place 1 patch onto the skin every 12 (twelve) hours. Remove & Discard patch within 12 hours or as directed by MD 12/03/18 12/03/19  Sable Feil, PA-C  lisinopril (PRINIVIL,ZESTRIL) 20 MG tablet TAKE 1 TABLET BY MOUTH ONCE DAILY 01/08/18   [name], PA  lovastatin (MEVACOR) 10 MG tablet Take 2 tablets (20 mg total) by mouth daily. 06/25/18   Birdie Sons, MD  metFORMIN (GLUCOPHAGE) 1000 MG tablet TAKE 1 TABLET BY MOUTH TWICE DAILY WITH MEALS 11/16/18   Birdie Sons, MD  metoprolol tartrate (LOPRESSOR) 100 MG tablet Take 1 tablet (100 mg total) by mouth at bedtime. Patient taking differently: Take 200 mg by mouth 2 (two) times daily.  10/24/16   Demetrios Loll, MD  mupirocin cream Drue Stager) 2 % Apply to affected area 3 times daily 12/03/18 12/03/19  Sable Feil, PA-C  nystatin cream (MYCOSTATIN) Apply 1 application topically 2 (two) times daily. Patient taking differently: Apply 1 application topically 2 (two) times daily as needed for dry skin.  07/17/14   Zara Council A, PA-C  traMADol-acetaminophen (ULTRACET) 37.5-325 MG tablet Take 2 tablets by mouth 4 (four) times daily. 04/09/17   Henreitta Leber, MD  warfarin (COUMADIN) 5 MG tablet Take 1 tablet (5 mg total) by mouth daily. 04/09/17   Henreitta Leber, MD  warfarin (COUMADIN) 7.5 MG tablet Take 1 tablet (7.5 mg total) by mouth daily. 11/08/18   Birdie Sons, MD    Allergies Clindamycin/lincomycin, Sulfa antibiotics, and Sulfasalazine  Family History  Adopted: Yes  Problem Relation Age of Onset  . Heart disease Mother   . Fibromyalgia Sister   .  Hypertension Daughter   . Migraines Daughter   . Kidney disease Neg Hx   . Prostate cancer Neg Hx   . Bladder Cancer Neg Hx     Social History Social History   Tobacco Use  . Smoking status: Never Smoker  . Smokeless tobacco: Never Used  Substance Use Topics  . Alcohol use: No    Alcohol/week: 0.0 standard drinks  . Drug use: No    Review of Systems  Constitutional: No fever/chills Eyes: No visual changes. ENT: No sore throat. Cardiovascular: Denies chest pain. Respiratory: Denies shortness of breath. Gastrointestinal: No abdominal pain.  No nausea, no vomiting.  No diarrhea.  No constipation. Genitourinary: Negative for dysuria. Musculoskeletal: Chronic back pain.   Skin: Porkeratosis Neurological: Negative for headaches, focal weakness or numbness.  Endocrine:  Hyperlipidemia hypertension. Allergic/Immunilogical: Clindamycin and sulfa antibiotics. ____________________________________________   PHYSICAL EXAM:  VITAL SIGNS: ED Triage Vitals  Enc Vitals Group     BP 12/03/18 1146 (!) 169/82     Pulse Rate 12/03/18 1146 (!) 102  Resp 12/03/18 1146 18     Temp 12/03/18 1146 98.2 F (36.8 C)     Temp Source 12/03/18 1146 Oral     SpO2 12/03/18 1146 97 %     Weight 12/03/18 1147 295 lb (133.8 kg)     Height 12/03/18 1147 5' 8"  (1.727 m)     Head Circumference --      Peak Flow --      Pain Score 12/03/18 1146 9     Pain Loc --      Pain Edu? --      Excl. in [place]? --    Constitutional: Alert and oriented. Well appearing and in no acute distress. Cardiovascular: Normal rate, regular rhythm. Grossly normal heart sounds.  Good peripheral circulation.  Evaded blood pressure. Respiratory: Normal respiratory effort.  No retractions. Lungs CTAB. Gastrointestinal: Soft and nontender. No distention. No abdominal bruits. No CVA tenderness. Musculoskeletal: No lower extremity tenderness nor edema.  No joint effusions. Neurologic:  Normal speech and language. No gross  focal neurologic deficits are appreciated. No gait instability. Skin: Mild erythema distal third of the right lower leg.  Rash consistent with porkeratosis. Psychiatric: Mood and affect are normal. Speech and behavior are normal.  ____________________________________________   LABS (all labs ordered are listed, but only abnormal results are displayed)  Labs Reviewed - No data to display ____________________________________________  EKG   ____________________________________________  RADIOLOGY  ED MD interpretation:    Official radiology report(s): US Venous Img Lower Unilateral Right  Result Date: 12/03/2018 CLINICAL DATA:  Thigh pain and redness EXAM: RIGHT LOWER EXTREMITY VENOUS DOPPLER ULTRASOUND TECHNIQUE: Gray-scale sonography with compression, as well as color and duplex ultrasound, were performed to evaluate the deep venous system from the level of the common femoral vein through the popliteal and proximal calf veins. COMPARISON:  10/22/2016 and previous FINDINGS: Normal compressibility of the common femoral, superficial femoral, and popliteal veins, as well as the proximal calf veins. No filling defects to suggest DVT on grayscale or color Doppler imaging. Doppler waveforms show normal direction of venous flow, normal respiratory phasicity and response to augmentation. Survey views of the contralateral common femoral vein are unremarkable. IMPRESSION: No femoropopliteal and no calf DVT in the visualized calf veins. If clinical symptoms are inconsistent or if there are persistent or worsening symptoms, further imaging (possibly involving the iliac veins) may be warranted. Electronically Signed   By: Lucrezia Europe M.D.   On: 12/03/2018 13:12    ____________________________________________   PROCEDURES  Procedure(s) performed (including Critical Care):  Procedures   ____________________________________________   INITIAL IMPRESSION / ASSESSMENT AND PLAN / ED COURSE  As part  of my medical decision making, I reviewed the following data within the [place] was evaluated in Emergency Department on 12/03/2018 for the symptoms described in the history of present illness. He was evaluated in the context of the global COVID-19 pandemic, which necessitated consideration that the patient might be at risk for infection with the SARS-CoV-2 virus that causes COVID-19. Institutional protocols and algorithms that pertain to the evaluation of patients at risk for COVID-19 are in a state of rapid change based on information released by regulatory bodies including the CDC and federal and state organizations. These policies and algorithms were followed during the patient's care in the ED.  Patient presents with right leg pain which increased with ambulation.  No provocative incident for complaint.  Patient was concerned for  DVT.  Discussed negative ultrasound findings with patient.  Patient has mild cellulitis to the right lower leg.  Patient given discharge care instructions and a prescription for Lidoderm patches and Bactroban.  Patient given a prescription for Flexeril to use as needed for muscle cramps.  Advised to follow-up with PCP.      ____________________________________________   FINAL CLINICAL IMPRESSION(S) / ED DIAGNOSES  Final diagnoses:  Cellulitis of right lower extremity  Right leg pain  Leg cramps     ED Discharge Orders         Ordered    mupirocin cream (BACTROBAN) 2 %     12/03/18 1433    lidocaine (LIDODERM) 5 %  Every 12 hours     12/03/18 1433           Note:  This document was prepared using Dragon voice recognition software and may include unintentional dictation errors.    Sable Feil, PA-C 12/03/18 1445    Earleen Newport, MD 12/03/18 [phone]

## 2018-12-03 NOTE — ED Triage Notes (Addendum)
Patient concerned he has possible dvt in right leg. No swelling. Reports some redness and pain worse with ambulation. Patient sees pain clinic for back and hip pain.

## 2018-12-03 NOTE — ED Notes (Signed)
See triage note  Presents with pain to right leg states pain is moving from right hip area and moves into knee  Also has some swelling to lower leg  Increased pain with standing

## 2018-12-03 NOTE — Telephone Encounter (Signed)
Pt called stating he has a blood clot in his leg and needs an appt today.    Per Jiles Garter pt was advise he needed to go to the emergency room because he is on blood thinners.  Pt stated he didn't want to do due to high expense.  Pt advised due to the risk he would need to go and if he came in for a visit we would have to send him to the ED anyway.    Pt agreed to go to the ED.

## 2018-12-07 ENCOUNTER — Ambulatory Visit: Payer: Self-pay

## 2018-12-08 ENCOUNTER — Ambulatory Visit: Payer: Self-pay

## 2018-12-14 ENCOUNTER — Ambulatory Visit (INDEPENDENT_AMBULATORY_CARE_PROVIDER_SITE_OTHER): Payer: Medicare HMO | Admitting: Family Medicine

## 2018-12-14 ENCOUNTER — Encounter: Payer: Self-pay | Admitting: Family Medicine

## 2018-12-14 ENCOUNTER — Other Ambulatory Visit: Payer: Self-pay

## 2018-12-14 VITALS — BP 171/109 | HR 116 | Temp 96.9°F | Wt 294.4 lb

## 2018-12-14 DIAGNOSIS — R609 Edema, unspecified: Secondary | ICD-10-CM | POA: Diagnosis not present

## 2018-12-14 DIAGNOSIS — R0600 Dyspnea, unspecified: Secondary | ICD-10-CM | POA: Diagnosis not present

## 2018-12-14 DIAGNOSIS — M5441 Lumbago with sciatica, right side: Secondary | ICD-10-CM | POA: Diagnosis not present

## 2018-12-14 DIAGNOSIS — R0609 Other forms of dyspnea: Secondary | ICD-10-CM

## 2018-12-14 DIAGNOSIS — G8929 Other chronic pain: Secondary | ICD-10-CM

## 2018-12-14 DIAGNOSIS — E11628 Type 2 diabetes mellitus with other skin complications: Secondary | ICD-10-CM

## 2018-12-14 DIAGNOSIS — L03115 Cellulitis of right lower limb: Secondary | ICD-10-CM | POA: Diagnosis not present

## 2018-12-14 DIAGNOSIS — I482 Chronic atrial fibrillation, unspecified: Secondary | ICD-10-CM

## 2018-12-14 DIAGNOSIS — Z23 Encounter for immunization: Secondary | ICD-10-CM

## 2018-12-14 DIAGNOSIS — L565 Disseminated superficial actinic porokeratosis (DSAP): Secondary | ICD-10-CM

## 2018-12-14 DIAGNOSIS — I10 Essential (primary) hypertension: Secondary | ICD-10-CM | POA: Diagnosis not present

## 2018-12-14 DIAGNOSIS — E7800 Pure hypercholesterolemia, unspecified: Secondary | ICD-10-CM

## 2018-12-14 DIAGNOSIS — M79604 Pain in right leg: Secondary | ICD-10-CM | POA: Diagnosis not present

## 2018-12-14 LAB — POCT INR
INR: 3 (ref 2.0–3.0)
PT: 35.5

## 2018-12-14 MED ORDER — CEPHALEXIN 500 MG PO CAPS: 500.0000 mg | ORAL_CAPSULE | Freq: Three times a day (TID) | ORAL | 0 refills | Status: AC

## 2018-12-14 MED ORDER — FUROSEMIDE 20 MG PO TABS: 20.0000 mg | ORAL_TABLET | Freq: Every day | ORAL | 0 refills | Status: DC

## 2018-12-14 NOTE — Progress Notes (Signed)
Patient: Christian Berg Male    DOB: November 22, 1950   68 y.o.   MRN: [id number] Visit Date: 12/14/2018  Today's Provider: Lelon Huh, MD   Chief Complaint  Patient presents with  . ER Follow Up   Subjective:     HPI  Follow up ER visit  Patient was seen in ER for 12/03/2018 for  right lower extremity and right leg pain. 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  Treatment for this included giving prescriptions for Lidoderm patches, Tramadol 100 mg and Bactroban. Patient given a prescription for Flexeril to use as needed for muscle cramps and advised to follow-up with PCP He reports good compliance with treatment. He reports this condition is unchanged back and leg pain. Improved mobility with treatment .  He has been experiencing more swelling in both legs and dyspnea on exertion the last several weeks.   ------------------------------------------------------------------------------------   Allergies  Allergen Reactions  . Clindamycin/Lincomycin     Chest discomfort  . Sulfa Antibiotics Hives  . Sulfasalazine Hives     Current Outpatient Medications:  .  aspirin EC 81 MG tablet, Take 81 mg by mouth every evening. , Disp: , Rfl:  .  brimonidine (ALPHAGAN) 0.2 % ophthalmic solution, brimonidine 0.2 % eye drops  INSTILL 1 DROP INTO EACH EYE TWICE DAILY, Disp: , Rfl:  .  glipiZIDE (GLUCOTROL) 10 MG tablet, Take 1 tablet (10 mg total) by mouth 2 (two) times daily before a meal., Disp: 180 tablet, Rfl: 1 .  lidocaine (LIDODERM) 5 %, Place 1 patch onto the skin every 12 (twelve) hours. Remove & Discard patch within 12 hours or as directed by MD, Disp: 10 patch, Rfl: 0 .  lisinopril (PRINIVIL,ZESTRIL) 20 MG tablet, TAKE 1 TABLET BY MOUTH ONCE DAILY, Disp: 90 tablet, Rfl: 3 .  lovastatin (MEVACOR) 10 MG tablet, Take 2 tablets (20 mg total) by mouth daily., Disp: 180  tablet, Rfl: 4 .  metFORMIN (GLUCOPHAGE) 1000 MG tablet, TAKE 1 TABLET BY MOUTH TWICE DAILY WITH MEALS, Disp: 180 tablet, Rfl: 4 .  metoprolol tartrate (LOPRESSOR) 100 MG tablet, Take 1 tablet (100 mg total) by mouth at bedtime. (Patient taking differently: Take 200 mg by mouth 2 (two) times daily. ), Disp: , Rfl:  .  mupirocin cream (BACTROBAN) 2 %, Apply to affected area 3 times daily, Disp: 30 g, Rfl: 0 .  nystatin cream (MYCOSTATIN), Apply 1 application topically 2 (two) times daily. (Patient taking differently: Apply 1 application topically 2 (two) times daily as needed for dry skin. ), Disp: 30 g, Rfl: 0 .  traMADol-acetaminophen (ULTRACET) 37.5-325 MG tablet, Take 2 tablets by mouth 4 (four) times daily., Disp: 30 tablet, Rfl: 0 .  warfarin (COUMADIN) 5 MG tablet, Take 1 tablet (5 mg total) by mouth daily., Disp: , Rfl:  .  warfarin (COUMADIN) 7.5 MG tablet, Take 1 tablet (7.5 mg total) by mouth daily., Disp: 30 tablet, Rfl: 5 .  traMADol (ULTRAM-ER) 100 MG 24 hr tablet, , Disp: , Rfl:   Review of Systems  Constitutional: Negative for appetite change, chills and fever.  Respiratory: Negative for chest tightness, shortness of breath and wheezing.   Cardiovascular: Negative for chest pain and palpitations.  Gastrointestinal: Negative for abdominal pain, nausea and vomiting.  Musculoskeletal: Positive for back pain.       Leg pain     Social History  Tobacco Use  . Smoking status: Never Smoker  . Smokeless tobacco: Never Used  Substance Use Topics  . Alcohol use: No    Alcohol/week: 0.0 standard drinks      Objective:   BP (!) 171/109 (BP Location: Right Arm, Patient Position: Sitting, Cuff Size: Large)   Pulse (!) 116   Temp (!) 96.9 F (36.1 C) (Temporal)   Wt 294 lb 6.4 oz (133.5 kg)   SpO2 96%   BMI 44.76 kg/m  Vitals:   12/14/18 1444  BP: (!) 171/109  Pulse: (!) 116  Temp: (!) 96.9 F (36.1 C)  TempSrc: Temporal  SpO2: 96%  Weight: 294 lb 6.4 oz (133.5 kg)   Body mass index is 44.76 kg/m.   Physical Exam   General Appearance:    Obese male in no acute distress  Eyes:    PERRL, conjunctiva/corneas clear, EOM's intact       Lungs:     Clear to auscultation bilaterally, respirations unlabored  Heart:    Tachycardic. Irregularly irregular rhythm.  2/6 holosystolic murmur at left lower sternal border  MS:   All extremities are intact. 2+ bipedal pitting edema with faint erythema of right anterior lower leg in addition to chronic lesions of DSAP (see photo)  Neurologic:   Awake, alert, oriented x 3. No apparent focal neurological           defect.             Assessment & Plan    1. Right leg pain There seem to be multiple issues here. Is somewhat improved on with - traMADol (ULTRAM-ER) 100 MG 24 hr tablet prescribed from ER. He likely has flare up of sciatica as below, but considering possible cellulitis and diabetes will hold off on prednisone at this time.   2. Cellulitis of right lower extremity Unclear how much of this is infection and how much is related to DSAP as below. Has not improved with topical antibiotic. start- cephALEXin (KEFLEX) 500 MG capsule; Take 1 capsule (500 mg total) by mouth 3 (three) times daily for 10 days.  Dispense: 30 capsule; Refill: 0 Reassess in 10-14 days.   3. Dyspnea on exertion Likely mild CHF exacerbation. Has been on furosemide in the past. Check   - B Nat Peptide - CBC  4. Edema, unspecified type start- furosemide (LASIX) 20 MG tablet; Take 1 tablet (20 mg total) by mouth daily.  Dispense: 10 tablet; Refill: 0  5. Chronic midline low back pain with right-sided sciatica Continue tramadol as above. Consider imaging studies if not improved at follow up .   6. Benign essential HTN Likely exacerbated by pain. Am starting furosemide, may benefit from SGLP-1 antogonist, hctz or spironolactone.  - Comprehensive metabolic panel  7. Type 2 diabetes mellitus with other skin complication, without  long-term current use of insulin (HCC)  - HgB A1c  8. Chronic atrial fibrillation (HCC)   9. Need for influenza vaccination  - Flu Vaccine QUAD High Dose(Fluad)  10. DSAP (disseminated superficial actinic porokeratosis) Has not been seen by derm in many years, and unclear what role this is playing in current skin lesions.  - Ambulatory referral to Dermatology     Lelon Huh, MD  [facility]

## 2018-12-15 LAB — CBC
Hematocrit: 42.4 % (ref 37.5–51.0)
Hemoglobin: 14.5 g/dL (ref 13.0–17.7)
MCH: 34.1 pg — ABNORMAL HIGH (ref 26.6–33.0)
MCHC: 34.2 g/dL (ref 31.5–35.7)
MCV: 100 fL — ABNORMAL HIGH (ref 79–97)
Platelets: 184 10*3/uL (ref 150–450)
RBC: 4.25 x10E6/uL (ref 4.14–5.80)
RDW: 13.3 % (ref 11.6–15.4)
WBC: 5.7 10*3/uL (ref 3.4–10.8)

## 2018-12-15 LAB — COMPREHENSIVE METABOLIC PANEL
ALT: 18 IU/L (ref 0–44)
AST: 38 IU/L (ref 0–40)
Albumin/Globulin Ratio: 1.2 (ref 1.2–2.2)
Albumin: 3.8 g/dL (ref 3.8–4.8)
Alkaline Phosphatase: 92 IU/L (ref 39–117)
BUN/Creatinine Ratio: 9 — ABNORMAL LOW (ref 10–24)
BUN: 12 mg/dL (ref 8–27)
Bilirubin Total: 0.9 mg/dL (ref 0.0–1.2)
CO2: 20 mmol/L (ref 20–29)
Calcium: 9.7 mg/dL (ref 8.6–10.2)
Chloride: 101 mmol/L (ref 96–106)
Creatinine, Ser: 1.34 mg/dL — ABNORMAL HIGH (ref 0.76–1.27)
GFR calc Af Amer: 62 mL/min/{1.73_m2} (ref >59)
GFR calc non Af Amer: 54 mL/min/{1.73_m2} — ABNORMAL LOW (ref >59)
Globulin, Total: 3.1 g/dL (ref 1.5–4.5)
Glucose: 143 mg/dL — ABNORMAL HIGH (ref 65–99)
Potassium: 4.6 mmol/L (ref 3.5–5.2)
Sodium: 140 mmol/L (ref 134–144)
Total Protein: 6.9 g/dL (ref 6.0–8.5)

## 2018-12-15 LAB — HEMOGLOBIN A1C
Est. average glucose Bld gHb Est-mCnc: 169 mg/dL
Hgb A1c MFr Bld: 7.5 % — ABNORMAL HIGH (ref 4.8–5.6)

## 2018-12-15 LAB — BRAIN NATRIURETIC PEPTIDE: BNP: 264.7 pg/mL — ABNORMAL HIGH (ref 0.0–100.0)

## 2018-12-16 ENCOUNTER — Encounter: Payer: Self-pay | Admitting: Family Medicine

## 2018-12-16 DIAGNOSIS — I071 Rheumatic tricuspid insufficiency: Secondary | ICD-10-CM | POA: Insufficient documentation

## 2018-12-27 ENCOUNTER — Ambulatory Visit: Payer: Medicare HMO | Admitting: Family Medicine

## 2019-01-08 ENCOUNTER — Telehealth: Payer: Self-pay | Admitting: Family Medicine

## 2019-01-08 DIAGNOSIS — R0609 Other forms of dyspnea: Secondary | ICD-10-CM

## 2019-01-08 DIAGNOSIS — R609 Edema, unspecified: Secondary | ICD-10-CM

## 2019-01-08 DIAGNOSIS — R7989 Other specified abnormal findings of blood chemistry: Secondary | ICD-10-CM

## 2019-01-08 DIAGNOSIS — R0600 Dyspnea, unspecified: Secondary | ICD-10-CM

## 2019-01-08 MED ORDER — FUROSEMIDE 20 MG PO TABS: 20.0000 mg | ORAL_TABLET | Freq: Every day | ORAL | 1 refills | Status: DC

## 2019-01-08 NOTE — Telephone Encounter (Signed)
-----   Message from Birdie Sons, MD sent at 12/24/2018 12:15 PM EST ----- Regarding: FW: be sure to refill furosemide at office visit  ----- Message ----- From: Birdie Sons, MD Sent: 12/24/2018 To: Birdie Sons, MD Subject: be sure to refill furosemide at office visit

## 2019-01-08 NOTE — Telephone Encounter (Signed)
It looks like patient cancelled his 12-27-2018 visit and rescheduled to January. He needs to get echocardiogram before then due to elevated BNP, and have placed order. He also needs to stay on furosemide once a day, have sent refill to his pharmacy. He needs to keep appt in January as scheduled.

## 2019-01-10 NOTE — Telephone Encounter (Signed)
Patient was advised. KW

## 2019-01-13 ENCOUNTER — Other Ambulatory Visit: Payer: Self-pay | Admitting: Family Medicine

## 2019-01-13 DIAGNOSIS — I10 Essential (primary) hypertension: Secondary | ICD-10-CM

## 2019-01-13 MED ORDER — LISINOPRIL 20 MG PO TABS: 20.0000 mg | ORAL_TABLET | Freq: Every day | ORAL | 0 refills | Status: DC

## 2019-01-13 NOTE — Telephone Encounter (Signed)
[facility] faxed refill request for the following medications:   lisinopril (PRINIVIL,ZESTRIL) 20 MG tablet   Please advise.  Thanks, [employer]

## 2019-01-24 ENCOUNTER — Ambulatory Visit: Payer: Medicare HMO | Attending: Family Medicine

## 2019-02-03 ENCOUNTER — Other Ambulatory Visit: Payer: Self-pay | Admitting: Family Medicine

## 2019-02-03 DIAGNOSIS — E119 Type 2 diabetes mellitus without complications: Secondary | ICD-10-CM

## 2019-02-04 ENCOUNTER — Ambulatory Visit: Payer: Medicare HMO | Admitting: Family Medicine

## 2019-03-02 ENCOUNTER — Encounter: Payer: Self-pay | Admitting: Family Medicine

## 2019-03-02 ENCOUNTER — Other Ambulatory Visit: Payer: Self-pay

## 2019-03-02 ENCOUNTER — Ambulatory Visit (INDEPENDENT_AMBULATORY_CARE_PROVIDER_SITE_OTHER): Payer: Medicare HMO | Admitting: Family Medicine

## 2019-03-02 VITALS — BP 148/77 | HR 134 | Temp 96.9°F | Resp 20 | Wt 297.0 lb

## 2019-03-02 DIAGNOSIS — I482 Chronic atrial fibrillation, unspecified: Secondary | ICD-10-CM | POA: Diagnosis not present

## 2019-03-02 DIAGNOSIS — E11628 Type 2 diabetes mellitus with other skin complications: Secondary | ICD-10-CM

## 2019-03-02 DIAGNOSIS — Z125 Encounter for screening for malignant neoplasm of prostate: Secondary | ICD-10-CM | POA: Diagnosis not present

## 2019-03-02 DIAGNOSIS — Z23 Encounter for immunization: Secondary | ICD-10-CM | POA: Diagnosis not present

## 2019-03-02 DIAGNOSIS — I10 Essential (primary) hypertension: Secondary | ICD-10-CM | POA: Diagnosis not present

## 2019-03-02 DIAGNOSIS — E538 Deficiency of other specified B group vitamins: Secondary | ICD-10-CM

## 2019-03-02 DIAGNOSIS — E7800 Pure hypercholesterolemia, unspecified: Secondary | ICD-10-CM

## 2019-03-02 LAB — POCT INR
INR: 3.8 — AB (ref 2.0–3.0)
PT: 45.5

## 2019-03-02 MED ORDER — WARFARIN SODIUM 5 MG PO TABS: ORAL_TABLET | ORAL | 1 refills | Status: DC

## 2019-03-02 NOTE — Patient Instructions (Signed)
Description   Dx: Atrial Fibrillation Current Coumadin dose: 7.58m daily PT:45.5 INR:  3.8 Today's Changes: hold for 2 days, then take 522mon Mon, Wed, Friday, and 7.31m31mll other days Recheck: 4 weeks

## 2019-03-02 NOTE — Progress Notes (Signed)
Patient: Christian Berg Male    DOB: 1950/05/12   69 y.o.   MRN: [id number] Visit Date: 03/02/2019  Today's Provider: Lelon Huh, MD   Chief Complaint  Patient presents with  . Hypertension  . Cellulitis  . Edema   Subjective:     HPI  Hypertension, follow-up:  BP Readings from Last 3 Encounters:  03/02/19 (!) 148/77  12/14/18 (!) 171/109  12/03/18 (!) 175/102    He was last seen for hypertension 2 months ago.  BP at that visit was 171/109. Management since that visit includes advising patient to start taking morning dose of furosemide due to edema and elevated BNP. He reports fair compliance with treatment. Has not been taking Furosemide. He is not having side effects.  He is not exercising. He is not adherent to low salt diet.   Outside blood pressures are not checked. He is experiencing none.  Patient denies chest pain, chest pressure/discomfort, claudication, dyspnea, exertional chest pressure/discomfort, fatigue, irregular heart beat, lower extremity edema, near-syncope, orthopnea, palpitations, paroxysmal nocturnal dyspnea, syncope and tachypnea.   Cardiovascular risk factors include none.  Use of agents associated with hypertension: NSAIDS.     Weight trend: fluctuating a bit Wt Readings from Last 3 Encounters:  03/02/19 297 lb (134.7 kg)  12/14/18 294 lb 6.4 oz (133.5 kg)  12/03/18 295 lb (133.8 kg)    Current diet: well balanced  ------------------------------------------------------------------------  Follow up for Cellulitis:  The patient was last seen for this 2 months ago. Changes made at last visit include starting a round of Keflex.  He reports good compliance with treatment. He feels that condition is resolved. Has chronic swelling, but redness and pain have completely resolved.  He is not having side effects.   ------------------------------------------------------------------------------------  He is also due for PT/INR on  warfarin for a-fib. States he occasionally feels palpitations.    Allergies  Allergen Reactions  . Clindamycin/Lincomycin     Chest discomfort  . Sulfa Antibiotics Hives  . Sulfasalazine Hives     Current Outpatient Medications:  .  aspirin EC 81 MG tablet, Take 81 mg by mouth every evening. , Disp: , Rfl:  .  brimonidine (ALPHAGAN) 0.2 % ophthalmic solution, brimonidine 0.2 % eye drops  INSTILL 1 DROP INTO EACH EYE TWICE DAILY, Disp: , Rfl:  .  furosemide (LASIX) 20 MG tablet, Take 1 tablet (20 mg total) by mouth daily., Disp: 30 tablet, Rfl: 1 .  glipiZIDE (GLUCOTROL) 10 MG tablet, TAKE 1 TABLET BY MOUTH TWICE DAILY BEFORE MEAL(S), Disp: 180 tablet, Rfl: 0 .  lidocaine (LIDODERM) 5 %, Place 1 patch onto the skin every 12 (twelve) hours. Remove & Discard patch within 12 hours or as directed by MD, Disp: 10 patch, Rfl: 0 .  lisinopril (ZESTRIL) 20 MG tablet, Take 1 tablet (20 mg total) by mouth daily., Disp: 90 tablet, Rfl: 0 .  lovastatin (MEVACOR) 10 MG tablet, Take 2 tablets (20 mg total) by mouth daily., Disp: 180 tablet, Rfl: 4 .  metFORMIN (GLUCOPHAGE) 1000 MG tablet, TAKE 1 TABLET BY MOUTH TWICE DAILY WITH MEALS, Disp: 180 tablet, Rfl: 4 .  metoprolol tartrate (LOPRESSOR) 100 MG tablet, Take 1 tablet (100 mg total) by mouth at bedtime. (Patient taking differently: Take 200 mg by mouth 2 (two) times daily. ), Disp: , Rfl:  .  mupirocin cream (BACTROBAN) 2 %, Apply to affected area 3 times daily, Disp: 30 g, Rfl: 0 .  nystatin cream (MYCOSTATIN), Apply  1 application topically 2 (two) times daily. (Patient taking differently: Apply 1 application topically 2 (two) times daily as needed for dry skin. ), Disp: 30 g, Rfl: 0 .  traMADol (ULTRAM-ER) 100 MG 24 hr tablet, , Disp: , Rfl:  .  traMADol-acetaminophen (ULTRACET) 37.5-325 MG tablet, Take 2 tablets by mouth 4 (four) times daily., Disp: 30 tablet, Rfl: 0 .  warfarin (COUMADIN) 5 MG tablet, Take 1 tablet (5 mg total) by mouth daily.,  Disp: , Rfl:  .  warfarin (COUMADIN) 7.5 MG tablet, Take 1 tablet (7.5 mg total) by mouth daily., Disp: 30 tablet, Rfl: 5  Review of Systems  Constitutional: Negative for appetite change, chills and fever.  Respiratory: Positive for shortness of breath (during exertion). Negative for chest tightness and wheezing.   Cardiovascular: Negative for chest pain and palpitations.  Gastrointestinal: Negative for abdominal pain, nausea and vomiting.    Social History   Tobacco Use  . Smoking status: Never Smoker  . Smokeless tobacco: Never Used  Substance Use Topics  . Alcohol use: No    Alcohol/week: 0.0 standard drinks      Objective:   BP (!) 148/77   Pulse (!) 134   Temp (!) 96.9 F (36.1 C) (Temporal)   Resp 20   Wt 297 lb (134.7 kg)   SpO2 96% Comment: room air  BMI 45.16 kg/m  Vitals:   03/02/19 1110 03/02/19 1113  BP: 138/80 (!) 148/77  Pulse: (!) 134   Resp: 20   Temp: (!) 96.9 F (36.1 C)   TempSrc: Temporal   SpO2: 96%   Weight: 297 lb (134.7 kg)   Body mass index is 45.16 kg/m.   Physical Exam   General: Appearance:    Obese male in no acute distress  Eyes:    PERRL, conjunctiva/corneas clear, EOM's intact       Lungs:     Clear to auscultation bilaterally, respirations unlabored  Heart:    Tachycardic. Irregularly irregular rhythm.  2/6 holosystolic murmur at left lower sternal border  MS:   All extremities are intact. 3+ bilateral foot and distal lower leg edema. No erythema.   Neurologic:   Awake, alert, oriented x 3. No apparent focal neurological           defect.        Results for orders placed or performed in visit on 03/02/19  POCT INR  Result Value Ref Range   INR 3.8 (A) 2.0 - 3.0   PT 45.5        Assessment & Plan    1. Benign essential HTN Fairly well controlled, Continue current medications.    2. Type 2 diabetes mellitus with other skin complication, without long-term current use of insulin ([place]) Counseled on cardiac and glucose  befits of of Jardiance and [place] and will consider after reviewing lab results.  - Hemoglobin A1c  3. Chronic atrial fibrillation (HCC) Adjust warfarin as per patient instructions.  - warfarin (COUMADIN) 5 MG tablet; Take as directed by your provider  Dispense: 30 tablet; Refill: 1  4. Hypercholesteremia He is tolerating lovastatin well with no adverse effects.   - Comprehensive metabolic panel - Lipid panel  5. B12 deficiency  - Vitamin B12  6. Need for pneumococcal vaccination  - Pneumococcal polysaccharide vaccine 23-valent greater than or equal to 2yo subcutaneous/IM  7. Prostate cancer screening  - PSA     Lelon Huh, MD  [facility]

## 2019-03-03 ENCOUNTER — Telehealth: Payer: Self-pay

## 2019-03-03 DIAGNOSIS — E11628 Type 2 diabetes mellitus with other skin complications: Secondary | ICD-10-CM

## 2019-03-03 LAB — COMPREHENSIVE METABOLIC PANEL
ALT: 20 IU/L (ref 0–44)
AST: 36 IU/L (ref 0–40)
Albumin/Globulin Ratio: 1.6 (ref 1.2–2.2)
Albumin: 3.9 g/dL (ref 3.8–4.8)
Alkaline Phosphatase: 102 IU/L (ref 39–117)
BUN/Creatinine Ratio: 9 — ABNORMAL LOW (ref 10–24)
BUN: 10 mg/dL (ref 8–27)
Bilirubin Total: 0.8 mg/dL (ref 0.0–1.2)
CO2: 17 mmol/L — ABNORMAL LOW (ref 20–29)
Calcium: 9.4 mg/dL (ref 8.6–10.2)
Chloride: 102 mmol/L (ref 96–106)
Creatinine, Ser: 1.13 mg/dL (ref 0.76–1.27)
GFR calc Af Amer: 76 mL/min/{1.73_m2} (ref >59)
GFR calc non Af Amer: 65 mL/min/{1.73_m2} (ref >59)
Globulin, Total: 2.4 g/dL (ref 1.5–4.5)
Glucose: 382 mg/dL — ABNORMAL HIGH (ref 65–99)
Potassium: 4.7 mmol/L (ref 3.5–5.2)
Sodium: 135 mmol/L (ref 134–144)
Total Protein: 6.3 g/dL (ref 6.0–8.5)

## 2019-03-03 LAB — LIPID PANEL
Chol/HDL Ratio: 3.2 ratio (ref 0.0–5.0)
Cholesterol, Total: 117 mg/dL (ref 100–199)
HDL: 37 mg/dL — ABNORMAL LOW (ref >39)
LDL Chol Calc (NIH): 48 mg/dL (ref 0–99)
Triglycerides: 197 mg/dL — ABNORMAL HIGH (ref 0–149)
VLDL Cholesterol Cal: 32 mg/dL (ref 5–40)

## 2019-03-03 LAB — PSA: Prostate Specific Ag, Serum: 0.7 ng/mL (ref 0.0–4.0)

## 2019-03-03 LAB — HEMOGLOBIN A1C
Est. average glucose Bld gHb Est-mCnc: 194 mg/dL
Hgb A1c MFr Bld: 8.4 % — ABNORMAL HIGH (ref 4.8–5.6)

## 2019-03-03 LAB — VITAMIN B12: Vitamin B-12: 335 pg/mL (ref 232–1245)

## 2019-03-03 MED ORDER — JARDIANCE 10 MG PO TABS: 10.0000 mg | ORAL_TABLET | Freq: Every day | ORAL | 0 refills | Status: DC

## 2019-03-03 NOTE — Telephone Encounter (Signed)
-----   Message from Birdie Sons, MD sent at 03/03/2019  8:05 AM EST ----- Sugar is a little high, a1c is up to 8.5. recommend he start samples of farxiga 5 mg once a day (or Jardiance 38m once a day if we don't have farxiga). This helps with sugar, but also helps heart function and blood pressure. He can have 5 weeks samples and schedule follow up o.v. in 1 month.

## 2019-03-03 NOTE — Telephone Encounter (Signed)
Patient advised and agrees to try samples. Samples of Jardiance placed up front for pick up. Follow up appointment scheduled for 04/05/2019 at 1:40pm.

## 2019-03-04 ENCOUNTER — Telehealth: Payer: Self-pay

## 2019-03-04 NOTE — Telephone Encounter (Signed)
The side effects are pretty rare at the lower doses of the samples the we give. It's very good for the heart and cardiologists even prescribe it for people that don't have diabetes.  He can always talk to Dr. Nehemiah Massed about it at his follow up next month if still is not sure.  He at least needs to schedule a follow up in 3 months to recheck his a1c.

## 2019-03-04 NOTE — Telephone Encounter (Signed)
Please advise 

## 2019-03-04 NOTE — Telephone Encounter (Signed)
Copied from [place] [id number]. Topic: General - Inquiry >> Mar 04, 2019 10:14 AM Scherrie Gerlach wrote: Reason for CRM: pt states the samples of jardience the dr sent home with his wife this past week for him, he is not going to take.  Pt states he has read this med has too many side effects.

## 2019-03-04 NOTE — Telephone Encounter (Signed)
Patient advised as below. Patient preferes to hold off on taking this medication. Follow up appointment scheduled 05/31/2019 at 1:40pm.

## 2019-03-09 ENCOUNTER — Telehealth: Payer: Self-pay | Admitting: Family Medicine

## 2019-03-09 DIAGNOSIS — I482 Chronic atrial fibrillation, unspecified: Secondary | ICD-10-CM

## 2019-03-09 MED ORDER — WARFARIN SODIUM 5 MG PO TABS: ORAL_TABLET | ORAL | 1 refills | Status: DC

## 2019-03-09 NOTE — Telephone Encounter (Signed)
Pt called and stated that medication warfarin (COUMADIN) 5 MG tablet [[id number]] is not at the pharmacy and would like a call back from the office. Please advise

## 2019-03-22 ENCOUNTER — Telehealth: Payer: Self-pay

## 2019-03-22 NOTE — Telephone Encounter (Signed)
Copied from [place]. Topic: General - Inquiry >> Mar 22, 2019 10:24 AM Richardo Priest, NT wrote: Reason for CRM: Patient called in stating he would like to have a cream similar to mupirocin cream (BACTROBAN), prescribed to him, as he says his heat rashes are back but not clearing up this time with this medication. Please advise as patient states he has been trying to treat it for 2 weeks.

## 2019-03-23 NOTE — Telephone Encounter (Signed)
Please see message below; patient requests that any Rx be sent to new pharmacy on file ([facility] in [place])

## 2019-03-23 NOTE — Telephone Encounter (Signed)
Need more information, what type of rash is having, where is it located? Any pain, swelling, itching, burning?

## 2019-03-23 NOTE — Telephone Encounter (Signed)
Pt states "Folds on lower belly, between legs, and scrotum, RED, Itchy, Smells, burns, pain, no swelling. Current cream is not working"  Please advise

## 2019-03-23 NOTE — Telephone Encounter (Signed)
I called and spoke with patient. The cream that he was using for current rash was not prescribed by Dr. Caryn Section. Patient states the Mupirocin cream was prescribed by a different doctor 2 years ago. Patient has a past history of cellulitis of the legs. I advised patient that he needs an office visit for evaluation. Appointment scheduled for tomorrow at 11:20am with [name].

## 2019-03-23 NOTE — Telephone Encounter (Signed)
Pt calling again to check on this medication. Please advise.

## 2019-03-24 ENCOUNTER — Other Ambulatory Visit: Payer: Self-pay

## 2019-03-24 ENCOUNTER — Encounter: Payer: Self-pay | Admitting: Physician Assistant

## 2019-03-24 ENCOUNTER — Ambulatory Visit (INDEPENDENT_AMBULATORY_CARE_PROVIDER_SITE_OTHER): Payer: Medicare HMO | Admitting: Physician Assistant

## 2019-03-24 VITALS — BP 128/89 | HR 101 | Temp 96.2°F | Wt 287.0 lb

## 2019-03-24 DIAGNOSIS — B372 Candidiasis of skin and nail: Secondary | ICD-10-CM

## 2019-03-24 MED ORDER — CLOTRIMAZOLE 1 % EX CREA: 1.0000 "application " | TOPICAL_CREAM | Freq: Two times a day (BID) | CUTANEOUS | 0 refills | Status: DC

## 2019-03-24 NOTE — Patient Instructions (Signed)
Skin Yeast Infection  A skin yeast infection is a condition in which there is an overgrowth of yeast (candida) that normally lives on the skin. This condition usually occurs in areas of the skin that are constantly warm and moist, such as the armpits or the groin. What are the causes? This condition is caused by a change in the normal balance of the yeast and bacteria that live on the skin. What increases the risk? You are more likely to develop this condition if you:  Are obese.  Are pregnant.  Take birth control pills.  Have diabetes.  Take antibiotic medicines.  Take steroid medicines.  Are malnourished.  Have a weak body defense system (immune system).  Are 85 years of age or older.  Wear tight clothing. What are the signs or symptoms? The most common symptom of this condition is itchiness in the affected area. Other symptoms include:  Red, swollen area of the skin.  Bumps on the skin. How is this diagnosed?  This condition is diagnosed with a medical history and physical exam.  Your health care provider may check for yeast by taking light scrapings of the skin to be viewed under a microscope. How is this treated? This condition is treated with medicine. Medicines may be prescribed or be available over the counter. The medicines may be:  Taken by mouth (orally).  Applied as a cream or powder to your skin. Follow these instructions at home:   Take or apply over-the-counter and prescription medicines only as told by your health care provider.  Maintain a healthy weight. If you need help losing weight, talk with your health care provider.  Keep your skin clean and dry.  If you have diabetes, keep your blood sugar under control.  Keep all follow-up visits as told by your health care provider. This is important. Contact a health care provider if:  Your symptoms go away and then return.  Your symptoms do not get better with treatment.  Your symptoms get  worse.  Your rash spreads.  You have a fever or chills.  You have new symptoms.  You have new warmth or redness of your skin. Summary  A skin yeast infection is a condition in which there is an overgrowth of yeast (candida) that normally lives on the skin. This condition is caused by a change in the normal balance of the yeast and bacteria that live on the skin.  Take or apply over-the-counter and prescription medicines only as told by your health care provider.  Keep your skin clean and dry.  Contact a health care provider if your symptoms do not get better with treatment. This information is not intended to replace advice given to you by your health care provider. Make sure you discuss any questions you have with your health care provider. Document Revised: 06/02/2017 Document Reviewed: 06/02/2017 Elsevier Patient Education  [place].

## 2019-03-24 NOTE — Progress Notes (Signed)
Patient: Christian Berg Male    DOB: Nov 23, 1950   69 y.o.   MRN: [id number] Visit Date: 03/24/2019  Today's Provider: Trinna Post, PA-C   Chief Complaint  Patient presents with  . Rash   Subjective:    I, Porsha McClurkin,CMA am acting as a Education administrator for [employer].  Rash This is a new problem. The current episode started 1 to 4 weeks ago. The problem is unchanged. The affected locations include the groin, left upper leg and right upper leg. The rash is characterized by dryness, redness, itchiness and pain. He was exposed to nothing. Pertinent negatives include no congestion, cough, diarrhea, facial edema or fever. Past treatments include nothing. The treatment provided no relief.   Reports he has a red, itchy rash on belly folds. He has used a leftover cream that has not helped. He denies fevers, chills.  Allergies  Allergen Reactions  . Clindamycin/Lincomycin     Chest discomfort  . Sulfa Antibiotics Hives  . Sulfasalazine Hives     Current Outpatient Medications:  .  aspirin EC 81 MG tablet, Take 81 mg by mouth every evening. , Disp: , Rfl:  .  brimonidine (ALPHAGAN) 0.2 % ophthalmic solution, brimonidine 0.2 % eye drops  INSTILL 1 DROP INTO EACH EYE TWICE DAILY, Disp: , Rfl:  .  empagliflozin (JARDIANCE) 10 MG TABS tablet, Take 10 mg by mouth daily before breakfast., Disp: 35 tablet, Rfl: 0 .  furosemide (LASIX) 20 MG tablet, Take 1 tablet (20 mg total) by mouth daily., Disp: 30 tablet, Rfl: 1 .  glipiZIDE (GLUCOTROL) 10 MG tablet, TAKE 1 TABLET BY MOUTH TWICE DAILY BEFORE MEAL(S), Disp: 180 tablet, Rfl: 0 .  lidocaine (LIDODERM) 5 %, Place 1 patch onto the skin every 12 (twelve) hours. Remove & Discard patch within 12 hours or as directed by MD, Disp: 10 patch, Rfl: 0 .  lisinopril (ZESTRIL) 20 MG tablet, Take 1 tablet (20 mg total) by mouth daily., Disp: 90 tablet, Rfl: 0 .  lovastatin (MEVACOR) 10 MG tablet, Take 2 tablets (20 mg total) by mouth daily.,  Disp: 180 tablet, Rfl: 4 .  metFORMIN (GLUCOPHAGE) 1000 MG tablet, TAKE 1 TABLET BY MOUTH TWICE DAILY WITH MEALS, Disp: 180 tablet, Rfl: 4 .  metoprolol tartrate (LOPRESSOR) 100 MG tablet, Take 1 tablet (100 mg total) by mouth at bedtime. (Patient taking differently: Take 200 mg by mouth 2 (two) times daily. ), Disp: , Rfl:  .  traMADol (ULTRAM-ER) 100 MG 24 hr tablet, , Disp: , Rfl:  .  traMADol-acetaminophen (ULTRACET) 37.5-325 MG tablet, Take 2 tablets by mouth 4 (four) times daily., Disp: 30 tablet, Rfl: 0 .  warfarin (COUMADIN) 5 MG tablet, Take as directed by your provider, Disp: 30 tablet, Rfl: 1 .  warfarin (COUMADIN) 7.5 MG tablet, Take 1 tablet (7.5 mg total) by mouth daily., Disp: 30 tablet, Rfl: 5 .  mupirocin cream (BACTROBAN) 2 %, Apply to affected area 3 times daily (Patient not taking: Reported on 03/24/2019), Disp: 30 g, Rfl: 0 .  nystatin cream (MYCOSTATIN), Apply 1 application topically 2 (two) times daily. (Patient not taking: Reported on 03/24/2019), Disp: 30 g, Rfl: 0  Review of Systems  Constitutional: Negative for fever.  HENT: Negative for congestion.   Respiratory: Negative for cough.   Gastrointestinal: Negative for diarrhea.  Skin: Positive for rash.    Social History   Tobacco Use  . Smoking status: Never Smoker  . Smokeless tobacco: Never  Used  Substance Use Topics  . Alcohol use: No    Alcohol/week: 0.0 standard drinks      Objective:   BP 128/89 (BP Location: Right Arm, Patient Position: Sitting, Cuff Size: Normal)   Pulse (!) 101   Temp (!) 96.2 F (35.7 C) (Temporal)   Wt 287 lb (130.2 kg)   SpO2 95%   BMI 43.64 kg/m  Vitals:   03/24/19 1126  BP: 128/89  Pulse: (!) 101  Temp: (!) 96.2 F (35.7 C)  TempSrc: Temporal  SpO2: 95%  Weight: 287 lb (130.2 kg)  Body mass index is 43.64 kg/m.   Physical Exam Constitutional:      Appearance: Normal appearance.  Skin:    General: Skin is warm and dry.     Findings: Rash present.      Comments: Erythematous papules scattered along inguinal folds.   Neurological:     Mental Status: He is alert and oriented to person, place, and time. Mental status is at baseline.  Psychiatric:        Mood and Affect: Mood normal.        Behavior: Behavior normal.      No results found for any visits on 03/24/19.     Assessment & Plan    1. Candida infection of flexural skin  Patient has tried Nystatin in the past but doesn't think it helped him. Will try antifungal cream as below. Follow up PRN.   - clotrimazole (CLOTRIMAZOLE AF) 1 % cream; Apply 1 application topically 2 (two) times daily.  Dispense: 30 g; Refill: 0  The entirety of the information documented in the History of Present Illness, Review of Systems and Physical Exam were personally obtained by me. Portions of this information were initially documented by [facility] and reviewed by me for thoroughness and accuracy.      Trinna Post, PA-C  [facility]

## 2019-03-25 ENCOUNTER — Other Ambulatory Visit: Payer: Self-pay | Admitting: Family Medicine

## 2019-03-25 DIAGNOSIS — B372 Candidiasis of skin and nail: Secondary | ICD-10-CM

## 2019-03-25 MED ORDER — CLOTRIMAZOLE 1 % EX CREA: 1.0000 "application " | TOPICAL_CREAM | Freq: Two times a day (BID) | CUTANEOUS | 0 refills | Status: DC

## 2019-03-25 NOTE — Telephone Encounter (Signed)
Please review. Patient was last seen by Adriana on 03/24/2019 for this problem and was prescribed Clotrimazole cream.

## 2019-03-25 NOTE — Telephone Encounter (Signed)
Medication was sent to the wrong pharmacy, will send to right one.

## 2019-03-25 NOTE — Telephone Encounter (Signed)
Pt called stating the cream that was sent over for him yesterday the pharmacy is denying receiving it. Pt is requesting to have it sent over again. Please advise.    [id number] [address], [place] AT [facility] [address]  [place] [address]  Phone: [phone] Fax: [phone]  Not a 24 hour pharmacy; exact hours not known.

## 2019-04-05 ENCOUNTER — Ambulatory Visit: Payer: Self-pay | Admitting: Family Medicine

## 2019-04-07 ENCOUNTER — Other Ambulatory Visit: Payer: Self-pay | Admitting: Family Medicine

## 2019-04-07 ENCOUNTER — Telehealth: Payer: Self-pay | Admitting: Family Medicine

## 2019-04-07 DIAGNOSIS — E1169 Type 2 diabetes mellitus with other specified complication: Secondary | ICD-10-CM

## 2019-04-07 DIAGNOSIS — I10 Essential (primary) hypertension: Secondary | ICD-10-CM

## 2019-04-07 DIAGNOSIS — I482 Chronic atrial fibrillation, unspecified: Secondary | ICD-10-CM

## 2019-04-07 MED ORDER — LISINOPRIL 20 MG PO TABS: 20.0000 mg | ORAL_TABLET | Freq: Every day | ORAL | 3 refills | Status: DC

## 2019-04-07 NOTE — Telephone Encounter (Signed)
Requested medication (s) are due for refill today  YES  Requested medication (s) are on the active medication list YES  Last ordered 11/08/18  Future visit scheduled YES  06/10/19.  LOV  03/24/19  Note to clinic: This is a non delegated medication.    Requested Prescriptions  Pending Prescriptions Disp Refills   warfarin (COUMADIN) 7.5 MG tablet [Pharmacy Med Name: Warfarin Sodium 7.5 MG Oral Tablet] 30 tablet 0    Sig: Take 1 tablet by mouth once daily      Hematology:  Anticoagulants - warfarin Failed - 04/07/2019 10:50 AM      Failed - This refill cannot be delegated      Failed - If the patient is managed by [facility] Clinic - route to their Pool. If not, forward to the provider.      Failed - INR in normal range and within 30 days    INR  Date Value Ref Range Status  03/02/2019 3.8 (A) 2.0 - 3.0 Final  05/02/2018 2.1 (H) 0.8 - 1.2 Final    Comment:    (NOTE) INR goal varies based on device and disease states. Performed at [facility], [address] [place], [place] [address]   09/19/2013 1.7  Final    Comment:    INR reference interval applies to patients on anticoagulant therapy. A single INR therapeutic range for coumarins is not optimal for all indications; however, the suggested range for most indications is 2.0 - 3.0. Exceptions to the INR Reference Range may include: Prosthetic heart valves, acute myocardial infarction, prevention of myocardial infarction, and combinations of aspirin and anticoagulant. The need for a higher or lower target INR must be assessed individually. Reference: The Pharmacology and Management of the Vitamin K  antagonists: the seventh ACCP Conference on Antithrombotic and Thrombolytic Therapy. MLYYT.0354 Sept:126 (3suppl): [id number]. A HCT value >55% may artifactually increase the PT.  In one study,  the increase was an average of 25%. Reference:  "Effect on Routine and Special Coagulation Testing Values of Citrate  Anticoagulant Adjustment in Patients with High HCT Values." American Journal of Clinical Pathology 2006;126:400-405.           Passed - Valid encounter within last 3 months    Recent Outpatient Visits           2 weeks ago Candida infection of flexural skin   [place], [place], [place]   1 month ago Benign essential HTN   [facility] Birdie Sons, MD   3 months ago Right leg pain   [place], [place]   5 months ago Cellulitis of right lower extremity   [facility] Birdie Sons, MD   8 months ago Acute cystitis with hematuria   [place], [place]       Future Appointments             In 1 month Fisher, Kirstie Peri, MD [facility], [place]

## 2019-04-07 NOTE — Telephone Encounter (Signed)
[facility] faxed refill request for the following medications:  lovastatin (MEVACOR) 10 MG tablet - pt wants 20 mg tablets so he can take just one tablet.   Please advise.  Thanks, [employer]

## 2019-04-07 NOTE — Telephone Encounter (Signed)
[facility] faxed refill request for the following medications:  lisinopril (ZESTRIL) 20 MG tablet   Please advise.  Thanks, [employer]

## 2019-04-08 ENCOUNTER — Telehealth: Payer: Self-pay | Admitting: Family Medicine

## 2019-04-08 ENCOUNTER — Encounter: Payer: Self-pay | Admitting: Physician Assistant

## 2019-04-08 ENCOUNTER — Other Ambulatory Visit: Payer: Self-pay

## 2019-04-08 ENCOUNTER — Ambulatory Visit (INDEPENDENT_AMBULATORY_CARE_PROVIDER_SITE_OTHER): Payer: Medicare HMO | Admitting: Physician Assistant

## 2019-04-08 ENCOUNTER — Other Ambulatory Visit (HOSPITAL_COMMUNITY)
Admission: RE | Admit: 2019-04-08 | Discharge: 2019-04-08 | Disposition: A | Discharge status: Home / Self Care | Payer: Medicare HMO | Source: Ambulatory Visit | Attending: Physician Assistant | Admitting: Physician Assistant

## 2019-04-08 VITALS — BP 128/85 | HR 86 | Temp 96.9°F | Wt 290.6 lb

## 2019-04-08 DIAGNOSIS — Z7901 Long term (current) use of anticoagulants: Secondary | ICD-10-CM

## 2019-04-08 DIAGNOSIS — R300 Dysuria: Secondary | ICD-10-CM

## 2019-04-08 DIAGNOSIS — B379 Candidiasis, unspecified: Secondary | ICD-10-CM | POA: Diagnosis not present

## 2019-04-08 DIAGNOSIS — E11628 Type 2 diabetes mellitus with other skin complications: Secondary | ICD-10-CM

## 2019-04-08 LAB — POCT URINALYSIS DIPSTICK
Bilirubin, UA: NEGATIVE
Glucose, UA: POSITIVE — AB
Leukocytes, UA: NEGATIVE
Nitrite, UA: NEGATIVE
Protein, UA: NEGATIVE
Spec Grav, UA: 1.015 (ref 1.010–1.025)
Urobilinogen, UA: 0.2 E.U./dL
pH, UA: 6 (ref 5.0–8.0)

## 2019-04-08 MED ORDER — LOVASTATIN 20 MG PO TABS: 20.0000 mg | ORAL_TABLET | Freq: Every evening | ORAL | 3 refills | Status: AC

## 2019-04-08 MED ORDER — CIPROFLOXACIN HCL 250 MG PO TABS: 250.0000 mg | ORAL_TABLET | Freq: Two times a day (BID) | ORAL | 0 refills | Status: AC

## 2019-04-08 MED ORDER — CIPROFLOXACIN HCL 250 MG PO TABS: 250.0000 mg | ORAL_TABLET | Freq: Two times a day (BID) | ORAL | 0 refills | Status: DC

## 2019-04-08 NOTE — Telephone Encounter (Signed)
Would you mind filling the ciprofloxacin for his cystitis Dr. Caryn Section? I imagine this is because of his warfarin. He is allergic to sulfa. Or if you would suggest an alternative that would be fine.

## 2019-04-08 NOTE — Telephone Encounter (Signed)
This patient saw Fabio Bering today. Please advise.

## 2019-04-08 NOTE — Addendum Note (Signed)
Addended by: Lelon Huh E on: 04/08/2019 08:00 AM   Modules accepted: Orders

## 2019-04-08 NOTE — Patient Instructions (Signed)

## 2019-04-08 NOTE — Telephone Encounter (Signed)
Pt called stating that the pharmacy is refusing to gice him his medication because of drug interactions. He states the pharmacy is needing a doctor to prescribe the medication in order to receive it from the pharmacy. Please  Advise.    [place], [place] [address]  [place] [address]  Phone: [phone] Fax: [phone]  Not a 24 hour pharmacy; exact hours not known.

## 2019-04-08 NOTE — Addendum Note (Signed)
Addended by: Birdie Sons on: 04/08/2019 03:23 PM   Modules accepted: Orders

## 2019-04-08 NOTE — Telephone Encounter (Signed)
Left patient's wife a message advising her that RX has been sent to [facility]

## 2019-04-08 NOTE — Telephone Encounter (Signed)
Patient wife is calling to check on the status of the cipro. Wife states that [facility] will close at 7p. CB- [phone]

## 2019-04-08 NOTE — Progress Notes (Signed)
Patient: Christian Berg Male    DOB: [birth year]   69 y.o.   MRN: [id number] Visit Date: 04/08/2019  Today's Provider: Trinna Post, PA-C   Chief Complaint  Patient presents with  . Dysuria   Subjective:     Dysuria  This is a new problem. The current episode started today. The problem occurs every urination. The quality of the pain is described as burning. There has been no fever. There is no history of pyelonephritis. Associated symptoms include frequency. Pertinent negatives include no chills, discharge, flank pain, hematuria, hesitancy, nausea, sweats, urgency or vomiting. He has tried nothing for the symptoms. His past medical history is significant for recurrent UTIs.   Patient on chronic coumadin with a history of uncontrolled Type II diabetes on jardiance presenting today with dysuria and urinary frequency x 1 day. Patient is also on warfarin for chronic atrial fibrillation.   Lab Results  Component Value Date   HGBA1C 8.4 (H) 03/02/2019     Allergies  Allergen Reactions  . Clindamycin/Lincomycin     Chest discomfort  . Sulfa Antibiotics Hives  . Sulfasalazine Hives     Current Outpatient Medications:  .  aspirin EC 81 MG tablet, Take 81 mg by mouth every evening. , Disp: , Rfl:  .  brimonidine (ALPHAGAN) 0.2 % ophthalmic solution, brimonidine 0.2 % eye drops  INSTILL 1 DROP INTO EACH EYE TWICE DAILY, Disp: , Rfl:  .  clotrimazole (CLOTRIMAZOLE AF) 1 % cream, Apply 1 application topically 2 (two) times daily., Disp: 30 g, Rfl: 0 .  empagliflozin (JARDIANCE) 10 MG TABS tablet, Take 10 mg by mouth daily before breakfast., Disp: 35 tablet, Rfl: 0 .  furosemide (LASIX) 20 MG tablet, Take 1 tablet (20 mg total) by mouth daily., Disp: 30 tablet, Rfl: 1 .  glipiZIDE (GLUCOTROL) 10 MG tablet, TAKE 1 TABLET BY MOUTH TWICE DAILY BEFORE MEAL(S), Disp: 180 tablet, Rfl: 0 .  lidocaine (LIDODERM) 5 %, Place 1 patch onto the skin every 12 (twelve) hours. Remove & Discard  patch within 12 hours or as directed by MD, Disp: 10 patch, Rfl: 0 .  lisinopril (ZESTRIL) 20 MG tablet, Take 1 tablet (20 mg total) by mouth daily., Disp: 90 tablet, Rfl: 3 .  lovastatin (MEVACOR) 20 MG tablet, Take 1 tablet (20 mg total) by mouth every evening., Disp: 90 tablet, Rfl: 3 .  metFORMIN (GLUCOPHAGE) 1000 MG tablet, TAKE 1 TABLET BY MOUTH TWICE DAILY WITH MEALS, Disp: 180 tablet, Rfl: 4 .  metoprolol tartrate (LOPRESSOR) 100 MG tablet, Take 1 tablet (100 mg total) by mouth at bedtime. (Patient taking differently: Take 200 mg by mouth 2 (two) times daily. ), Disp: , Rfl:  .  mupirocin cream (BACTROBAN) 2 %, Apply to affected area 3 times daily (Patient not taking: Reported on 03/24/2019), Disp: 30 g, Rfl: 0 .  nystatin cream (MYCOSTATIN), Apply 1 application topically 2 (two) times daily. (Patient not taking: Reported on 03/24/2019), Disp: 30 g, Rfl: 0 .  traMADol (ULTRAM-ER) 100 MG 24 hr tablet, , Disp: , Rfl:  .  traMADol-acetaminophen (ULTRACET) 37.5-325 MG tablet, Take 2 tablets by mouth 4 (four) times daily., Disp: 30 tablet, Rfl: 0 .  warfarin (COUMADIN) 5 MG tablet, Take as directed by your provider, Disp: 30 tablet, Rfl: 1 .  warfarin (COUMADIN) 7.5 MG tablet, Take 1 tablet by mouth once daily, Disp: 30 tablet, Rfl: 0  Review of Systems  Constitutional: Negative for chills.  Gastrointestinal:  Negative for nausea and vomiting.  Genitourinary: Positive for dysuria and frequency. Negative for flank pain, hematuria, hesitancy and urgency.    Social History   Tobacco Use  . Smoking status: Never Smoker  . Smokeless tobacco: Never Used  Substance Use Topics  . Alcohol use: No    Alcohol/week: 0.0 standard drinks      Objective:   There were no vitals taken for this visit. There were no vitals filed for this visit.There is no height or weight on file to calculate BMI.   Physical Exam Constitutional:      Appearance: Normal appearance.  Cardiovascular:     Rate and  Rhythm: Normal rate and regular rhythm.     Heart sounds: Normal heart sounds.  Pulmonary:     Effort: Pulmonary effort is normal.     Breath sounds: Normal breath sounds.  Abdominal:     General: Bowel sounds are normal.     Palpations: Abdomen is soft.     Tenderness: There is no abdominal tenderness.  Neurological:     Mental Status: He is alert and oriented to person, place, and time. Mental status is at baseline.  Psychiatric:        Mood and Affect: Mood normal.        Behavior: Behavior normal.      No results found for any visits on 04/08/19.     Assessment & Plan    1. Dysuria He is on chronic warfarin therapy which interacts with the ciprofloxacin. He has a sulfa allergy. I will get his INR today except our machine is not working. We can get his INR through the lab and he will follow up in the office on Monday for repeat INR.   - CULTURE, URINE COMPREHENSIVE - Urine cytology ancillary only - ciprofloxacin (CIPRO) 250 MG tablet; Take 1 tablet (250 mg total) by mouth 2 (two) times daily for 7 days.  Dispense: 14 tablet; Refill: 0 - POCT urinalysis dipstick  2. Type 2 diabetes mellitus with other skin complication, without long-term current use of insulin (HCC)   3. Chronic anticoagulation  - INR/PT  4. Yeast infection  Must consider a yeast infection due to uncontrolled diabetes and jardiance.   The entirety of the information documented in the History of Present Illness, Review of Systems and Physical Exam were personally obtained by me. Portions of this information were initially documented by Elonda Husky, CMA and reviewed by me for thoroughness and accuracy.         Trinna Post, PA-C  [facility]

## 2019-04-09 LAB — PROTIME-INR
INR: 2.1 — ABNORMAL HIGH (ref 0.9–1.2)
Prothrombin Time: 22.2 s — ABNORMAL HIGH (ref 9.1–12.0)

## 2019-04-11 ENCOUNTER — Ambulatory Visit: Payer: Self-pay | Admitting: Physician Assistant

## 2019-04-11 NOTE — Telephone Encounter (Signed)
Patient called in stating pharmacy is stating that they still do not have the medication. Patient's wife states she would like to speak to someone about this medication, as she states it is disturbing that it was not sent sufficiently the first time. Patient has been all weekend without medication. Please advise

## 2019-04-11 NOTE — Telephone Encounter (Signed)
Per Lenna Sciara at [facility] they did receive RX but was not able to dispense to patient due to possible interaction between Cipro and Warfarin. Per Dr Reyne Dumas advised okay to fill medication. Patient advised RX should be ready for pick up.

## 2019-04-13 LAB — URINE CYTOLOGY ANCILLARY ONLY
Candida Urine: NEGATIVE
Chlamydia: NEGATIVE
Comment: NEGATIVE
Comment: NORMAL
Neisseria Gonorrhea: NEGATIVE

## 2019-04-15 LAB — CULTURE, URINE COMPREHENSIVE

## 2019-05-15 ENCOUNTER — Other Ambulatory Visit: Payer: Self-pay | Admitting: Family Medicine

## 2019-05-15 DIAGNOSIS — E119 Type 2 diabetes mellitus without complications: Secondary | ICD-10-CM

## 2019-05-15 NOTE — Telephone Encounter (Signed)
Requested Prescriptions  Pending Prescriptions Disp Refills  . glipiZIDE (GLUCOTROL) 10 MG tablet [Pharmacy Med Name: glipiZIDE 10 MG Oral Tablet] 180 tablet 1    Sig: TAKE 1 TABLET BY MOUTH TWICE DAILY BEFORE MEAL(S)     Endocrinology:  Diabetes - Sulfonylureas Failed - 05/15/2019 10:40 AM      Failed - HBA1C is between 0 and 7.9 and within 180 days    Hgb A1c MFr Bld  Date Value Ref Range Status  03/02/2019 8.4 (H) 4.8 - 5.6 % Final    Comment:             Prediabetes: 5.7 - 6.4          Diabetes: >6.4          Glycemic control for adults with diabetes: <7.0          Passed - Valid encounter within last 6 months    Recent Outpatient Visits          1 month ago [place], [place], PA-C   1 month ago Candida infection of flexural skin   [facility] [place], [place]   2 months ago Benign essential HTN   [facility] Birdie Sons, MD   5 months ago Right leg pain   [facility] Birdie Sons, MD   6 months ago Cellulitis of right lower extremity   [facility] Birdie Sons, MD      Future Appointments            In 2 weeks Fisher, Kirstie Peri, MD [facility], [name]

## 2019-05-16 ENCOUNTER — Telehealth: Payer: Self-pay

## 2019-05-16 NOTE — Telephone Encounter (Signed)
Copied from [place] [id number]. Topic: Appointment Scheduling - Scheduling Inquiry for Clinic >> May 16, 2019  9:39 AM Percell Belt A wrote: Reason for CRM: Pt called in and would like to come only 1 day for appt and AWV.  He would like to resch to get them on the same day.  He would like to move the AWV to the 4th  Best number [phone]

## 2019-05-18 NOTE — Telephone Encounter (Signed)
Spoke with pt who states he does not want to come in the office for two separate visits. Offered VV or same day. There was no availability for the same day on the day pt was supposed to see Dr Caryn Section. Pt states a VV would work fine. Scheduled a telephonic visit for 05/23/19 @ 9:40 AM. OV with PCP stayed the same for 05/31/19 @ 1:40 PM.

## 2019-05-19 NOTE — Progress Notes (Signed)
Subjective:   Christian Berg is a 69 y.o. male who presents for an Initial Medicare Annual Wellness Visit.    This visit is being conducted through telemedicine due to the COVID-19 pandemic. This patient has given me verbal consent via doximity to conduct this visit, patient states they are participating from their home address. Some vital signs may be absent or patient reported.    Patient identification: identified by name, DOB, and current address  Review of Systems  N/A  Cardiac Risk Factors include: advanced age (>94mn, >>35women);diabetes mellitus;dyslipidemia;hypertension;male gender    Objective:    Today's Vitals   05/23/19 0931  PainSc: 8    There is no height or weight on file to calculate BMI. Unable to obtain vitals due to visit being conducted via telephonically.   Advanced Directives 05/23/2019 12/03/2018 10/27/2018 04/30/2018 04/29/2018 03/30/2017 03/30/2017  Does Patient Have a Medical Advance Directive? Yes Yes Yes Yes Yes No Yes  Type of [place] Living will [place] - Living will;Healthcare Power of Attorney  Does patient want to make changes to medical advance directive? - - No - Guardian declined No - Patient declined - - No - Patient declined  Copy of [place] Chart? No - copy requested - No - copy requested No - copy requested No - copy requested - No - copy requested  Would patient like information on creating a medical advance directive? - - - - - No - Patient declined -    Current Medications (verified) Outpatient Encounter Medications as of 05/23/2019  Medication Sig  . aspirin EC 81 MG tablet Take 81 mg by mouth every evening.   . clotrimazole (CLOTRIMAZOLE AF) 1 % cream Apply 1 application topically 2 (two) times daily.  .[name] KitchenglipiZIDE (GLUCOTROL) 10 MG tablet TAKE 1 TABLET BY  MOUTH TWICE DAILY BEFORE MEAL(S)  . lisinopril (ZESTRIL) 20 MG tablet Take 1 tablet (20 mg total) by mouth daily.  .[name] Kitchenlovastatin (MEVACOR) 20 MG tablet Take 1 tablet (20 mg total) by mouth every evening.  . metFORMIN (GLUCOPHAGE) 1000 MG tablet TAKE 1 TABLET BY MOUTH TWICE DAILY WITH MEALS  . metoprolol tartrate (LOPRESSOR) 100 MG tablet Take 1 tablet (100 mg total) by mouth at bedtime. (Patient taking differently: Take 200 mg by mouth 2 (two) times daily. )  . traMADol (ULTRAM-ER) 100 MG 24 hr tablet Take 100 mg by mouth daily.   . traMADol-acetaminophen (ULTRACET) 37.5-325 MG tablet Take 2 tablets by mouth 4 (four) times daily.  .[name] Kitchenwarfarin (COUMADIN) 5 MG tablet Take as directed by your provider  . warfarin (COUMADIN) 7.5 MG tablet Take 1 tablet by mouth once daily  . brimonidine (ALPHAGAN) 0.2 % ophthalmic solution brimonidine 0.2 % eye drops  INSTILL 1 DROP INTO EACH EYE TWICE DAILY  . empagliflozin (JARDIANCE) 10 MG TABS tablet Take 10 mg by mouth daily before breakfast. (Patient not taking: Reported on 05/23/2019)  . furosemide (LASIX) 20 MG tablet Take 1 tablet (20 mg total) by mouth daily. (Patient not taking: Reported on 05/23/2019)  . lidocaine (LIDODERM) 5 % Place 1 patch onto the skin every 12 (twelve) hours. Remove & Discard patch within 12 hours or as directed by MD (Patient not taking: Reported on 04/08/2019)  . mupirocin cream (BACTROBAN) 2 % Apply to affected area 3 times daily (Patient not taking: Reported on 03/24/2019)  .  nystatin cream (MYCOSTATIN) Apply 1 application topically 2 (two) times daily. (Patient not taking: Reported on 03/24/2019)   No facility-administered encounter medications on file as of 05/23/2019.    Allergies (verified) Clindamycin/lincomycin, Sulfa antibiotics, and Sulfasalazine   History: Past Medical History:  Diagnosis Date  . A-fib ([place])   . Carotid stenosis   . CHF (congestive heart failure) ([place])   . Degenerative disc disease, lumbar    s/p  injury  . Diabetes mellitus without complication ([place])   . Disseminated superficial actinic porokeratosis   . Dysrhythmia    A-FIB, palpatations  . 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 Fatty liver   . History of degenerative disc disease   . History of kidney stones   . Hypercholesteremia   . Hypertension   . Kidney stones   . Obesity   . Pacemaker    inactive  . Peripheral vascular disease ([place])    Carotid stenosis  . Presence of permanent cardiac pacemaker    Inactive  . TIA (transient ischemic attack) 2011   No deficits  . TIA (transient ischemic attack)   . Vitamin B12 deficiency    Past Surgical History:  Procedure Laterality Date  . APPENDECTOMY  2004  . CARDIAC CATHETERIZATION    . CARDIAC PACEMAKER PLACEMENT  2005  . CATARACT EXTRACTION W/PHACO Right 06/14/2014   Procedure: CATARACT EXTRACTION PHACO AND INTRAOCULAR LENS PLACEMENT (IOC);  Surgeon: Leandrew Koyanagi, MD;  Location: [place];  Service: Ophthalmology;  Laterality: Right;  . CATARACT EXTRACTION W/PHACO Left 10/27/2018   Procedure: CATARACT EXTRACTION PHACO AND INTRAOCULAR LENS PLACEMENT (IOC) LEFT DIABETIC  00:46.4  18.6%  8.63;  Surgeon: Leandrew Koyanagi, MD;  Location: [facility];  Service: Ophthalmology;  Laterality: Left;  Diabetic - oral meds  . CYSTOSCOPY W/ RETROGRADES Bilateral 08/09/2014   Procedure: CYSTOSCOPY WITH RETROGRADE PYELOGRAM;  Surgeon: Hollice Espy, MD;  Location: [facility];  Service: Urology;  Laterality: Bilateral;  . INTUBATION-ENDOTRACHEAL WITH TRACHEOSTOMY STANDBY  03/30/2017   Procedure: INTUBATION-ENDOTRACHEAL WITH TRACHEOSTOMY STANDBY;  Surgeon: Carloyn Manner, MD;  Location: [facility];  Service: ENT;;   Family History  Adopted: Yes  Problem Relation Age of Onset  . Heart disease Mother   . Fibromyalgia Sister   . Hypertension Daughter   . Migraines Daughter   . Kidney disease Neg Hx   . Prostate cancer Neg Hx   . Bladder Cancer Neg Hx    Social History    Socioeconomic History  . Marital status: Married    Spouse name: Not on file  . Number of children: 2  . Years of education: Not on file  . Highest education level: High school graduate  Occupational History  . Occupation: retired  . Occupation: disable  Tobacco Use  . Smoking status: Never Smoker  . Smokeless tobacco: Never Used  Substance and Sexual Activity  . Alcohol use: No    Alcohol/week: 0.0 standard drinks  . Drug use: No  . Sexual activity: Not on file  Other Topics Concern  . Not on file  Social History Narrative   Lives at home with wife. Independent at baseline   Social Determinants of Health   Financial Resource Strain: Low Risk   . Difficulty of Paying Living Expenses: Not hard at all  Food Insecurity: No Food Insecurity  . Worried About Charity fundraiser in the Last Year: Never true  . Ran Out of Food in the Last Year: Never true  Transportation Needs: No Transportation Needs  .  Lack of Transportation (Medical): No  . Lack of Transportation (Non-Medical): No  Physical Activity: Inactive  . Days of Exercise per Week: 0 days  . Minutes of Exercise per Session: 0 min  Stress: No Stress Concern Present  . Feeling of Stress : Not at all  Social Connections: Somewhat Isolated  . Frequency of Communication with Friends and Family: Twice a week  . Frequency of Social Gatherings with Friends and Family: Once a week  . Attends Religious Services: Never  . Active Member of Clubs or Organizations: No  . Attends Archivist Meetings: Never  . Marital Status: Married   Tobacco Counseling Counseling given: Not Answered   Clinical Intake:  Pre-visit preparation completed: Yes  Pain : 0-10 Pain Score: 8  Pain Type: Chronic pain Pain Location: Back Pain Orientation: Lower, Right Pain Descriptors / Indicators: Aching Pain Frequency: Constant     Nutritional Risks: None Diabetes: Yes  How often do you need to have someone help you when you  read instructions, pamphlets, or other written materials from your doctor or pharmacy?: 1 - Never   Diabetes:  Is the patient diabetic?  Yes  If diabetic, was a CBG obtained today?  No  Did the patient bring in their glucometer from home?  No  How often do you monitor your CBG's? Not at all.   Financial Strains and Diabetes Management:  Are you having any financial strains with the device, your supplies or your medication? No .  Does the patient want to be seen by Chronic Care Management for management of their diabetes?  No  Would the patient like to be referred to a Nutritionist or for Diabetic Management?  No   Diabetic Exams:  Diabetic Eye Exam: Completed 06/10/18. Repeat yearly.  Diabetic Foot Exam: Currently due. Pt has been advised about the importance in completing this exam. Note made to follow up on this at next in office apt.   Interpreter Needed?: No  Information entered by :: [facility], LPN  Activities of Daily Living In your present state of health, do you have any difficulty performing the following activities: 05/23/2019 10/27/2018  Hearing? N N  Vision? N N  Difficulty concentrating or making decisions? N N  Walking or climbing stairs? N N  Dressing or bathing? N N  Doing errands, shopping? Y -  Comment Does not drive. -  Preparing Food and eating ? N -  Using the Toilet? N -  In the past six months, have you accidently leaked urine? N -  Do you have problems with loss of bowel control? N -  Managing your Medications? N -  Managing your Finances? N -  Housekeeping or managing your Housekeeping? N -  Some recent data might be hidden     Immunizations and Health Maintenance Immunization History  Administered Date(s) Administered  . Fluad Quad(high Dose 65+) 12/14/2018  . Influenza, High Dose Seasonal PF 11/14/2014, 10/06/2016, 10/16/2017  . Influenza,inj,Quad PF,6+ Mos 11/04/2012, 09/30/2013, 11/07/2015  . Pneumococcal Conjugate-13 10/16/2017  .  Pneumococcal Polysaccharide-23 03/02/2019  . Tdap 09/16/2010   Health Maintenance Due  Topic Date Due  . FOOT EXAM  Never done  . COVID-19 Vaccine (1) Never done  . COLONOSCOPY  Never done    Patient Care Team: Birdie Sons, MD as PCP - General (Family Medicine) Corey Skains, MD as Consulting Physician (Cardiology)  Indicate any recent Medical Services you may have received from other than [facility] providers in the past year (  date may be approximate).    Assessment:   This is a routine wellness examination for [place].  Hearing/Vision screen No exam data present  Dietary issues and exercise activities discussed: Current Exercise Habits: The patient does not participate in regular exercise at present, Exercise limited by: orthopedic condition(s)  Goals    . DIET - INCREASE WATER INTAKE     Recommend to drink at least 6-8 8oz glasses of water per day.    . Prevent falls     Recommend to remove any items from the home that may cause slips or trips.      Depression Screen PHQ 2/9 Scores 03/02/2019  PHQ - 2 Score 0    Fall Risk Fall Risk  05/23/2019 03/02/2019 10/16/2017  Falls in the past year? 1 0 No  Number falls in past yr: 0 0 -  Injury with Fall? 0 0 -  Follow up Falls prevention discussed Falls evaluation completed -    FALL RISK PREVENTION PERTAINING TO THE HOME:  Any stairs in or around the home? No  If so, are there any without handrails? N/A  Home free of loose throw rugs in walkways, pet beds, electrical cords, etc? Yes  Adequate lighting in your home to reduce risk of falls? Yes   ASSISTIVE DEVICES UTILIZED TO PREVENT FALLS:  Life alert? No  Use of a cane, walker or w/c? Yes  Grab bars in the bathroom? Yes  Shower chair or bench in shower? Yes  Elevated toilet seat or a handicapped toilet? No    TIMED UP AND GO:  Was the test performed? No .    Cognitive Function: Declined today.        Screening Tests Health Maintenance  Topic Date Due    . FOOT EXAM  Never done  . COVID-19 Vaccine (1) Never done  . COLONOSCOPY  Never done  . OPHTHALMOLOGY EXAM  06/10/2019  . INFLUENZA VACCINE  08/28/2019  . HEMOGLOBIN A1C  08/30/2019  . TETANUS/TDAP  09/15/2020  . Hepatitis C Screening  Completed  . PNA vac Low Risk Adult  Completed    Qualifies for Shingles Vaccine? Yes . Due for Shingrix. Pt has been advised to call insurance company to determine out of pocket expense. Advised may also receive vaccine at local pharmacy or Health Dept. Verbalized acceptance and understanding.  Tdap: Up to date  Flu Vaccine: Up to date  Pneumococcal Vaccine: Completed series  Cancer Screenings:  Colorectal Screening: Currently due. Cologuard ordered today.   Lung Cancer Screening: (Low Dose CT Chest recommended if Age 45-80 years, 30 pack-year currently smoking OR have quit w/in 15years.) does not qualify.   Additional Screening:  Hepatitis C Screening: Up to date  Dental Screening: Recommended annual dental exams for proper oral hygiene  Community Resource Referral:  CRR required this visit?  No        Plan:  I have personally reviewed and addressed the Medicare Annual Wellness questionnaire and have noted the following in the patient's chart:  A. Medical and social history B. Use of alcohol, tobacco or illicit drugs  C. Current medications and supplements D. Functional ability and status E.  Nutritional status F.  Physical activity G. Advance directives H. List of other physicians I.  Hospitalizations, surgeries, and ER visits in previous 12 months J.  [place] such as hearing and vision if needed, cognitive and depression L. Referrals and appointments   In addition, I have reviewed and discussed with patient certain  preventive protocols, quality metrics, and best practice recommendations. A written personalized care plan for preventive services as well as general preventive health recommendations were provided to  patient.   Glendora Score, [place]   [id number]  Nurse Health Advisor    Nurse Notes: Pt needs a diabetic foot exam at next in office visit. Cologuard re-ordered today.

## 2019-05-23 ENCOUNTER — Other Ambulatory Visit: Payer: Self-pay

## 2019-05-23 ENCOUNTER — Ambulatory Visit (INDEPENDENT_AMBULATORY_CARE_PROVIDER_SITE_OTHER): Payer: Medicare HMO

## 2019-05-23 DIAGNOSIS — Z1211 Encounter for screening for malignant neoplasm of colon: Secondary | ICD-10-CM | POA: Diagnosis not present

## 2019-05-23 DIAGNOSIS — Z0000 Encounter for general adult medical examination without abnormal findings: Secondary | ICD-10-CM | POA: Diagnosis not present

## 2019-05-23 NOTE — Patient Instructions (Signed)
Mr. Christian Berg , Thank you for taking time to come for your Medicare Wellness Visit. I appreciate your ongoing commitment to your health goals. Please review the following plan we discussed and let me know if I can assist you in the future.   Screening recommendations/referrals: Colonoscopy: Cologuard due- ordered today.  Recommended yearly ophthalmology/optometry visit for glaucoma screening and checkup Recommended yearly dental visit for hygiene and checkup  Vaccinations: Influenza vaccine: Up to date Pneumococcal vaccine: Completed series Tdap vaccine: Up to date Shingles vaccine: Pt declines today.     Advanced directives: Please bring a copy of your POA (Power of Attorney) and/or Living Will to your next appointment.   Conditions/risks identified: Fall risk prevention discussed today. Recommend to increase water intake to 6-8 8 oz glasses a day.   Next appointment: 05/31/19 @ 1:40 PM with Dr Caryn Section. Declined scheduling an AWV for 2022 at this time.   Preventive Care 71 Years and Older, Male Preventive care refers to lifestyle choices and visits with your health care provider that can promote health and wellness. What does preventive care include?  A yearly physical exam. This is also called an annual well check.  Dental exams once or twice a year.  Routine eye exams. Ask your health care provider how often you should have your eyes checked.  Personal lifestyle choices, including:  Daily care of your teeth and gums.  Regular physical activity.  Eating a healthy diet.  Avoiding tobacco and drug use.  Limiting alcohol use.  Practicing safe sex.  Taking low doses of aspirin every day.  Taking vitamin and mineral supplements as recommended by your health care provider. What happens during an annual well check? The services and screenings done by your health care provider during your annual well check will depend on your age, overall health, lifestyle risk factors, and  family history of disease. Counseling  Your health care provider may ask you questions about your:  Alcohol use.  Tobacco use.  Drug use.  Emotional well-being.  Home and relationship well-being.  Sexual activity.  Eating habits.  History of falls.  Memory and ability to understand (cognition).  Work and work Statistician. Screening  You may have the following tests or measurements:  Height, weight, and BMI.  Blood pressure.  Lipid and cholesterol levels. These may be checked every 5 years, or more frequently if you are over 32 years old.  Skin check.  Lung cancer screening. You may have this screening every year starting at age 71 if you have a 30-pack-year history of smoking and currently smoke or have quit within the past 15 years.  Fecal occult blood test (FOBT) of the stool. You may have this test every year starting at age 71.  Flexible sigmoidoscopy or colonoscopy. You may have a sigmoidoscopy every 5 years or a colonoscopy every 10 years starting at age 71.  Prostate cancer screening. Recommendations will vary depending on your family history and other risks.  Hepatitis C blood test.  Hepatitis B blood test.  Sexually transmitted disease (STD) testing.  Diabetes screening. This is done by checking your blood sugar (glucose) after you have not eaten for a while (fasting). You may have this done every 1-3 years.  Abdominal aortic aneurysm (AAA) screening. You may need this if you are a current or former smoker.  Osteoporosis. You may be screened starting at age 71 if you are at high risk. Talk with your health care provider about your test results, treatment options, and if  necessary, the need for more tests. Vaccines  Your health care provider may recommend certain vaccines, such as:  Influenza vaccine. This is recommended every year.  Tetanus, diphtheria, and acellular pertussis (Tdap, Td) vaccine. You may need a Td booster every 10 years.  Zoster  vaccine. You may need this after age 24.  Pneumococcal 13-valent conjugate (PCV13) vaccine. One dose is recommended after age 27.  Pneumococcal polysaccharide (PPSV23) vaccine. One dose is recommended after age 23. Talk to your health care provider about which screenings and vaccines you need and how often you need them. This information is not intended to replace advice given to you by your health care provider. Make sure you discuss any questions you have with your health care provider. Document Released: 02/09/2015 Document Revised: 10/03/2015 Document Reviewed: 11/14/2014 Elsevier Interactive Patient Education  2017 [place] Prevention in the Home Falls can cause injuries. They can happen to people of all ages. There are many things you can do to make your home safe and to help prevent falls. What can I do on the outside of my home?  Regularly fix the edges of walkways and driveways and fix any cracks.  Remove anything that might make you trip as you walk through a door, such as a raised step or threshold.  Trim any bushes or trees on the path to your home.  Use bright outdoor lighting.  Clear any walking paths of anything that might make someone trip, such as rocks or tools.  Regularly check to see if handrails are loose or broken. Make sure that both sides of any steps have handrails.  Any raised decks and porches should have guardrails on the edges.  Have any leaves, snow, or ice cleared regularly.  Use sand or salt on walking paths during winter.  Clean up any spills in your garage right away. This includes oil or grease spills. What can I do in the bathroom?  Use night lights.  Install grab bars by the toilet and in the tub and shower. Do not use towel bars as grab bars.  Use non-skid mats or decals in the tub or shower.  If you need to sit down in the shower, use a plastic, non-slip stool.  Keep the floor dry. Clean up any water that spills on the  floor as soon as it happens.  Remove soap buildup in the tub or shower regularly.  Attach bath mats securely with double-sided non-slip rug tape.  Do not have throw rugs and other things on the floor that can make you trip. What can I do in the bedroom?  Use night lights.  Make sure that you have a light by your bed that is easy to reach.  Do not use any sheets or blankets that are too big for your bed. They should not hang down onto the floor.  Have a firm chair that has side arms. You can use this for support while you get dressed.  Do not have throw rugs and other things on the floor that can make you trip. What can I do in the kitchen?  Clean up any spills right away.  Avoid walking on wet floors.  Keep items that you use a lot in easy-to-reach places.  If you need to reach something above you, use a strong step stool that has a grab bar.  Keep electrical cords out of the way.  Do not use floor polish or wax that makes floors slippery. If you must  use wax, use non-skid floor wax.  Do not have throw rugs and other things on the floor that can make you trip. What can I do with my stairs?  Do not leave any items on the stairs.  Make sure that there are handrails on both sides of the stairs and use them. Fix handrails that are broken or loose. Make sure that handrails are as long as the stairways.  Check any carpeting to make sure that it is firmly attached to the stairs. Fix any carpet that is loose or worn.  Avoid having throw rugs at the top or bottom of the stairs. If you do have throw rugs, attach them to the floor with carpet tape.  Make sure that you have a light switch at the top of the stairs and the bottom of the stairs. If you do not have them, ask someone to add them for you. What else can I do to help prevent falls?  Wear shoes that:  Do not have high heels.  Have rubber bottoms.  Are comfortable and fit you well.  Are closed at the toe. Do not wear  sandals.  If you use a stepladder:  Make sure that it is fully opened. Do not climb a closed stepladder.  Make sure that both sides of the stepladder are locked into place.  Ask someone to hold it for you, if possible.  Clearly mark and make sure that you can see:  Any grab bars or handrails.  First and last steps.  Where the edge of each step is.  Use tools that help you move around (mobility aids) if they are needed. These include:  Canes.  Walkers.  Scooters.  Crutches.  Turn on the lights when you go into a dark area. Replace any light bulbs as soon as they burn out.  Set up your furniture so you have a clear path. Avoid moving your furniture around.  If any of your floors are uneven, fix them.  If there are any pets around you, be aware of where they are.  Review your medicines with your doctor. Some medicines can make you feel dizzy. This can increase your chance of falling. Ask your doctor what other things that you can do to help prevent falls. This information is not intended to replace advice given to you by your health care provider. Make sure you discuss any questions you have with your health care provider. Document Released: 11/09/2008 Document Revised: 06/21/2015 Document Reviewed: 02/17/2014 Elsevier Interactive Patient Education  2017 [employer].

## 2019-05-24 ENCOUNTER — Ambulatory Visit: Payer: Self-pay | Admitting: Family Medicine

## 2019-05-24 NOTE — Telephone Encounter (Signed)
Pt reports productive cough, SOB, congestion x 2-3 days. States SOB worsened last night. Also reports chills yesterday, unsure if febrile, "Don't feel like it today." Reports wheezing at times.  Pt's speech halting during call.States SOB at rest as well as with exertion. Reports "Difficult to take a deep breath." States no known exposure to covid. H/O CHF. Pt sounds distressed. Directed to ED. Pt states unsure he will follow disposition. Advised covid screening would prohibit in-office visit, Reiterated need for ED eval,  states "I may wait and see." CB# [phone]  Reason for Disposition . [1] MODERATE difficulty breathing (e.g., speaks in phrases, SOB even at rest, pulse 100-120) AND [2] NEW-onset or WORSE than normal  Answer Assessment - Initial Assessment Questions 1. RESPIRATORY STATUS: "Describe your breathing?" (e.g., wheezing, shortness of breath, unable to speak, severe coughing)      SOB, at rest 2. ONSET: "When did this breathing problem begin?"      LAst night 3. PATTERN "Does the difficult breathing come and go, or has it been constant since it started?"      Constant 4. SEVERITY: "How bad is your breathing?" (e.g., mild, moderate, severe)    - MILD: No SOB at rest, mild SOB with walking, speaks normally in sentences, can lay down, no retractions, pulse < 100.    - MODERATE: SOB at rest, SOB with minimal exertion and prefers to sit, cannot lie down flat, speaks in phrases, mild retractions, audible wheezing, pulse 100-120.    - SEVERE: Very SOB at rest, speaks in single words, struggling to breathe, sitting hunched forward, retractions, pulse > 120      Moderate 5. RECURRENT SYMPTOM: "Have you had difficulty breathing before?" If so, ask: "When was the last time?" and "What happened that time?"      Not really 6. CARDIAC HISTORY: "Do you have any history of heart disease?" (e.g., heart attack, angina, bypass surgery, angioplasty)      CHF 7. LUNG HISTORY: "Do you have any  history of lung disease?"  (e.g., pulmonary embolus, asthma, emphysema)     Noo 8. CAUSE: "What do you think is causing the breathing problem?"      Allergies 9. OTHER SYMPTOMS: "Do you have any other symptoms? (e.g., dizziness, runny nose, cough, chest pain, fever)    Cough, chills yesterday 11. TRAVEL: "Have you traveled out of the country in the last month?" (e.g., travel history, exposures)       no  Protocols used: BREATHING DIFFICULTY-A-AH

## 2019-05-30 NOTE — Progress Notes (Signed)
Established patient visit   Patient: Christian Berg   DOB: 1950/03/15   69 y.o. Male  MRN: [id number] Visit Date: 05/31/2019  Today's healthcare provider: Lelon Huh, MD   Chief Complaint  Patient presents with  . Diabetes   Mertie Moores as a scribe for Lelon Huh, MD.,have documented all relevant documentation on the behalf of Lelon Huh, MD,as directed by  Lelon Huh, MD while in the presence of Lelon Huh, MD.  Subjective    HPI Diabetes Mellitus Type II, Follow-up  Lab Results  Component Value Date   HGBA1C 8.4 (H) 03/02/2019   HGBA1C 7.5 (H) 12/14/2018   HGBA1C 11.3 (A) 07/05/2018   Last seen for diabetes 3 months ago.  Management since then includes started Jardiance. He reports poor compliance with treatment. He took it for 'a little bit' and stopped' He is not having side effects.  Symptoms: Yes fatigue No foot ulcerations No appetite changes No nausea No paresthesia (numbness or tingling) of the feet  No polydipsia (excessive thirst) No polyuria (frequent urination) No visual disturbances  No vomiting  Home blood sugar records: not being checked  Episodes of hypoglycemia? No    Current insulin regiment: None Most Recent Eye Exam: 06/10/2018 Current exercise: none Current diet habits: per patient he is eating less   Pertinent Labs: Lab Results  Component Value Date   CHOL 117 03/02/2019   HDL 37 (L) 03/02/2019   LDLCALC 48 03/02/2019   TRIG 197 (H) 03/02/2019   CHOLHDL 3.2 03/02/2019   Lab Results  Component Value Date   NA 135 03/02/2019   K 4.7 03/02/2019   CO2 17 (L) 03/02/2019   GLUCOSE 382 (H) 03/02/2019   BUN 10 03/02/2019   CREATININE 1.13 03/02/2019   CALCIUM 9.4 03/02/2019   GFRNONAA 65 03/02/2019   GFRAA 76 03/02/2019     Wt Readings from Last 3 Encounters:  05/31/19 (!) 320 lb 3.2 oz (145.2 kg)  04/08/19 290 lb 9.6 oz (131.8 kg)  03/24/19 287 lb (130.2 kg)    --------------------------------------------------------------------------------------------------- Has also been having more swelling the last few months. Was previously on furosemide, but stopped taking it due to urinary frequency. Has appt with Dr. Nehemiah Massed at the end of this week.       Medications: Outpatient Medications Prior to Visit  Medication Sig  . aspirin EC 81 MG tablet Take 81 mg by mouth every evening.   . brimonidine (ALPHAGAN) 0.2 % ophthalmic solution brimonidine 0.2 % eye drops  INSTILL 1 DROP INTO EACH EYE TWICE DAILY  . clotrimazole (CLOTRIMAZOLE AF) 1 % cream Apply 1 application topically 2 (two) times daily.  [name] glipiZIDE (GLUCOTROL) 10 MG tablet TAKE 1 TABLET BY MOUTH TWICE DAILY BEFORE MEAL(S)  . lisinopril (ZESTRIL) 20 MG tablet Take 1 tablet (20 mg total) by mouth daily.  [name] lovastatin (MEVACOR) 20 MG tablet Take 1 tablet (20 mg total) by mouth every evening.  . metFORMIN (GLUCOPHAGE) 1000 MG tablet TAKE 1 TABLET BY MOUTH TWICE DAILY WITH MEALS  . metoprolol tartrate (LOPRESSOR) 100 MG tablet Take 1 tablet (100 mg total) by mouth at bedtime. (Patient taking differently: Take 200 mg by mouth 2 (two) times daily. )  . nystatin cream (MYCOSTATIN) Apply 1 application topically 2 (two) times daily.  . traMADol (ULTRAM-ER) 100 MG 24 hr tablet Take 100 mg by mouth daily.   . traMADol-acetaminophen (ULTRACET) 37.5-325 MG tablet Take 2 tablets by mouth 4 (four) times daily.  [name] warfarin (  COUMADIN) 5 MG tablet Take as directed by your provider  . warfarin (COUMADIN) 7.5 MG tablet Take 1 tablet by mouth once daily  . [DISCONTINUED] mupirocin cream (BACTROBAN) 2 % Apply to affected area 3 times daily  . empagliflozin (JARDIANCE) 10 MG TABS tablet Take 10 mg by mouth daily before breakfast. (Patient not taking: Reported on 05/23/2019)  . furosemide (LASIX) 20 MG tablet Take 1 tablet (20 mg total) by mouth daily. (Patient not taking: Reported on 05/23/2019)  . lidocaine  (LIDODERM) 5 % Place 1 patch onto the skin every 12 (twelve) hours. Remove & Discard patch within 12 hours or as directed by MD (Patient not taking: Reported on 04/08/2019)   No facility-administered medications prior to visit.    Review of Systems  Constitutional: Positive for unexpected weight change. Negative for appetite change, chills and fever.  Respiratory: Positive for shortness of breath. Negative for chest tightness and wheezing.   Cardiovascular: Negative for chest pain and palpitations.  Gastrointestinal: Negative for abdominal pain, nausea and vomiting.  Neurological: Positive for weakness.      Objective    BP (!) 152/92 (BP Location: Right Arm, Patient Position: Sitting, Cuff Size: Large)   Pulse 92   Temp (!) 96.9 F (36.1 C) (Temporal)   Wt (!) 320 lb 3.2 oz (145.2 kg)   BMI 48.69 kg/m    Physical Exam   General: Appearance:    Severely obese male in no acute distress  Eyes:    PERRL, conjunctiva/corneas clear, EOM's intact       Lungs:     Clear to auscultation bilaterally, respirations unlabored  Heart:    Normal heart rate. Irregularly irregular rhythm.  2/6 holosystolic murmur at left lower sternal border  MS:   All extremities are intact. 3+ bipedal edema, 1+ edema both upper extremities.   Neurologic:   Awake, alert, oriented x 3. No apparent focal neurological           defect.       Results for orders placed or performed in visit on 05/31/19  POCT HgB A1C  Result Value Ref Range   Hemoglobin A1C 6.3 (A) 4.0 - 5.6 %   Est. average glucose Bld gHb Est-mCnc 134     Assessment & Plan     1. Type 2 diabetes mellitus with other skin complication, without long-term current use of insulin (HCC) Well controlled, but has not been taking Jardiance. He was advised of important cardiac benefits of BP lowering effects of medication and is to start back on it tomorrow. morning  2. Dyspnea on exertion Check BNP  3. Other fatigue TSG  4. Mixed simple and  mucopurulent chronic bronchitis ([place]) He typically has trouble this time of years and does not have his own inhaler. Sent prescription for albuterol inhaler to use prn.   5. Chronic diastolic heart failure ([place]) He has not been taking Jardiance or furosemide. Strongly encouraged to start back on Jardiance and at least take furosemide prn swelling or dyspnea. He has follow up with Dr. Nehemiah Massed later this week and will forward him a copy of labs.   6. Morbid (severe) obesity due to excess calories ([place]) Get back on Jardiance, then consider a GLP-1 agonist when stable.   Follow up 3 months.          Lelon Huh, MD  [facility] [phone] (phone) [phone] (fax)  [place]

## 2019-05-31 ENCOUNTER — Encounter: Payer: Self-pay | Admitting: Family Medicine

## 2019-05-31 ENCOUNTER — Other Ambulatory Visit: Payer: Self-pay

## 2019-05-31 ENCOUNTER — Ambulatory Visit (INDEPENDENT_AMBULATORY_CARE_PROVIDER_SITE_OTHER): Payer: Medicare HMO | Admitting: Family Medicine

## 2019-05-31 VITALS — BP 152/92 | HR 92 | Temp 96.9°F | Wt 320.2 lb

## 2019-05-31 DIAGNOSIS — R0600 Dyspnea, unspecified: Secondary | ICD-10-CM

## 2019-05-31 DIAGNOSIS — J418 Mixed simple and mucopurulent chronic bronchitis: Secondary | ICD-10-CM | POA: Diagnosis not present

## 2019-05-31 DIAGNOSIS — E11628 Type 2 diabetes mellitus with other skin complications: Secondary | ICD-10-CM | POA: Diagnosis not present

## 2019-05-31 DIAGNOSIS — I5032 Chronic diastolic (congestive) heart failure: Secondary | ICD-10-CM

## 2019-05-31 DIAGNOSIS — R5383 Other fatigue: Secondary | ICD-10-CM | POA: Diagnosis not present

## 2019-05-31 DIAGNOSIS — R0609 Other forms of dyspnea: Secondary | ICD-10-CM

## 2019-05-31 DIAGNOSIS — D649 Anemia, unspecified: Secondary | ICD-10-CM | POA: Diagnosis not present

## 2019-05-31 LAB — POCT GLYCOSYLATED HEMOGLOBIN (HGB A1C)
Est. average glucose Bld gHb Est-mCnc: 134
Hemoglobin A1C: 6.3 % — AB (ref 4.0–5.6)

## 2019-05-31 MED ORDER — ALBUTEROL SULFATE HFA 108 (90 BASE) MCG/ACT IN AERS
2.0000 | INHALATION_SPRAY | Freq: Four times a day (QID) | RESPIRATORY_TRACT | 3 refills | Status: DC | PRN
Start: 2019-05-31 — End: 2020-01-30

## 2019-06-01 ENCOUNTER — Ambulatory Visit: Payer: Medicare HMO

## 2019-06-01 LAB — CBC
Hematocrit: 32.6 % — ABNORMAL LOW (ref 37.5–51.0)
Hemoglobin: 10.4 g/dL — ABNORMAL LOW (ref 13.0–17.7)
MCH: 32.8 pg (ref 26.6–33.0)
MCHC: 31.9 g/dL (ref 31.5–35.7)
MCV: 103 fL — ABNORMAL HIGH (ref 79–97)
Platelets: 209 10*3/uL (ref 150–450)
RBC: 3.17 x10E6/uL — ABNORMAL LOW (ref 4.14–5.80)
RDW: 13.1 % (ref 11.6–15.4)
WBC: 5.1 10*3/uL (ref 3.4–10.8)

## 2019-06-01 LAB — COMPREHENSIVE METABOLIC PANEL
ALT: 19 IU/L (ref 0–44)
AST: 37 IU/L (ref 0–40)
Albumin/Globulin Ratio: 1.3 (ref 1.2–2.2)
Albumin: 3.4 g/dL — ABNORMAL LOW (ref 3.8–4.8)
Alkaline Phosphatase: 108 IU/L (ref 39–117)
BUN/Creatinine Ratio: 11 (ref 10–24)
BUN: 14 mg/dL (ref 8–27)
Bilirubin Total: 0.5 mg/dL (ref 0.0–1.2)
CO2: 15 mmol/L — ABNORMAL LOW (ref 20–29)
Calcium: 8.9 mg/dL (ref 8.6–10.2)
Chloride: 106 mmol/L (ref 96–106)
Creatinine, Ser: 1.27 mg/dL (ref 0.76–1.27)
GFR calc Af Amer: 66 mL/min/{1.73_m2} (ref >59)
GFR calc non Af Amer: 57 mL/min/{1.73_m2} — ABNORMAL LOW (ref >59)
Globulin, Total: 2.6 g/dL (ref 1.5–4.5)
Glucose: 180 mg/dL — ABNORMAL HIGH (ref 65–99)
Potassium: 4.6 mmol/L (ref 3.5–5.2)
Sodium: 136 mmol/L (ref 134–144)
Total Protein: 6 g/dL (ref 6.0–8.5)

## 2019-06-01 LAB — T4, FREE: Free T4: 1.25 ng/dL (ref 0.82–1.77)

## 2019-06-01 LAB — TSH: TSH: 2.35 u[IU]/mL (ref 0.450–4.500)

## 2019-06-01 LAB — BRAIN NATRIURETIC PEPTIDE: BNP: 273.8 pg/mL — ABNORMAL HIGH (ref 0.0–100.0)

## 2019-06-02 ENCOUNTER — Encounter: Payer: Self-pay | Admitting: Family Medicine

## 2019-06-02 ENCOUNTER — Telehealth: Payer: Self-pay

## 2019-06-02 DIAGNOSIS — D509 Iron deficiency anemia, unspecified: Secondary | ICD-10-CM | POA: Insufficient documentation

## 2019-06-02 LAB — IRON AND TIBC
Iron Saturation: 9 % — CL (ref 15–55)
Iron: 35 ug/dL — ABNORMAL LOW (ref 38–169)
Total Iron Binding Capacity: 376 ug/dL (ref 250–450)
UIBC: 341 ug/dL (ref 111–343)

## 2019-06-02 LAB — SPECIMEN STATUS REPORT

## 2019-06-02 LAB — FERRITIN: Ferritin: 45 ng/mL (ref 30–400)

## 2019-06-02 NOTE — Telephone Encounter (Signed)
OTC Iron Sulfate 313m daily added to medication list.

## 2019-06-03 DIAGNOSIS — I5042 Chronic combined systolic (congestive) and diastolic (congestive) heart failure: Secondary | ICD-10-CM | POA: Diagnosis not present

## 2019-06-03 DIAGNOSIS — I482 Chronic atrial fibrillation, unspecified: Secondary | ICD-10-CM | POA: Diagnosis not present

## 2019-06-03 DIAGNOSIS — E782 Mixed hyperlipidemia: Secondary | ICD-10-CM | POA: Diagnosis not present

## 2019-06-03 DIAGNOSIS — I6523 Occlusion and stenosis of bilateral carotid arteries: Secondary | ICD-10-CM | POA: Diagnosis not present

## 2019-06-03 DIAGNOSIS — I10 Essential (primary) hypertension: Secondary | ICD-10-CM | POA: Diagnosis not present

## 2019-06-06 ENCOUNTER — Telehealth: Payer: Self-pay | Admitting: Family Medicine

## 2019-06-06 DIAGNOSIS — E11628 Type 2 diabetes mellitus with other skin complications: Secondary | ICD-10-CM

## 2019-06-06 MED ORDER — JARDIANCE 10 MG PO TABS: 10.0000 mg | ORAL_TABLET | Freq: Every day | ORAL | 0 refills | Status: DC

## 2019-06-06 MED ORDER — JARDIANCE 10 MG PO TABS: 10.0000 mg | ORAL_TABLET | Freq: Every day | ORAL | 1 refills | Status: DC

## 2019-06-06 NOTE — Telephone Encounter (Signed)
Qty of 28 Samples placed up front. Refill also sent to pharmacy. Patient advised.

## 2019-06-06 NOTE — Telephone Encounter (Signed)
Pt is calling and would like more samples of jardiance 10 mg. If no samples available please send new rx to [facility] [address]. Pt wife is coming in around noon today

## 2019-06-07 ENCOUNTER — Telehealth: Payer: Self-pay

## 2019-06-07 ENCOUNTER — Telehealth: Payer: Self-pay | Admitting: Family Medicine

## 2019-06-07 DIAGNOSIS — B356 Tinea cruris: Secondary | ICD-10-CM

## 2019-06-07 MED ORDER — KETOCONAZOLE 2 % EX CREA: 1.0000 "application " | TOPICAL_CREAM | Freq: Every day | CUTANEOUS | 1 refills | Status: DC

## 2019-06-07 NOTE — Telephone Encounter (Signed)
Pt is calling to let dr Caryn Section know he now has yeast in private area. Pt also has a little bleeding in that area.. Pt was given anti fungal miconazole nitrate 2% powder when he was in the hospital a year ago. Pt would like something stronger . Pt states the fluid pill is not making swelling go  down in left arm and feet. [facility] [address]

## 2019-06-07 NOTE — Telephone Encounter (Signed)
Patient advised.

## 2019-06-07 NOTE — Telephone Encounter (Signed)
Have sent prescription for ketoconazole cream to [facility]

## 2019-06-07 NOTE — Telephone Encounter (Signed)
Copied from [place] [id number]. Topic: General - Other >> Jun 07, 2019  2:23 PM Rainey Pines A wrote: Patient stated that he requested for the powder for his yeast infection be sent to the pharmacy however, the cream that is not helpful was sent. Patient would like powder sent to pharmacy and a callback. Please advise [place] [address] [place]  [place], [place] [address]

## 2019-06-08 ENCOUNTER — Telehealth: Payer: Self-pay | Admitting: *Deleted

## 2019-06-08 IMAGING — CT CT NECK W/ CM
3 of 5 series · 12 of 35 positions shown, 14 images · IV contrast (iopamidol)
Comparison: Head and cervical spine CT 09/15/2015. Carotid Doppler
ultrasound 01/21/2017.

CLINICAL DATA: Sore throat beginning last night. Voice hoarseness.
Difficulty swallowing.

EXAM:
CT NECK WITH CONTRAST
TECHNIQUE: Multidetector CT imaging of the neck was performed using the
standard protocol following the bolus administration of intravenous
contrast.
CONTRAST:  75mL 6YQ3DO-111 IOPAMIDOL ([id number]-111) INJECTION 61%

[Series 6: sag neck · sagittal · 0.53mm/px · 5 of 104 slices shown, 6 images]
[im 35/104  bone]
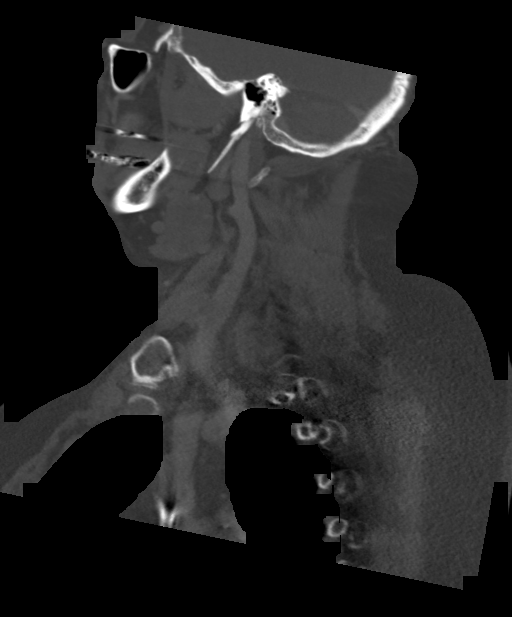
[im 43/104  bone]
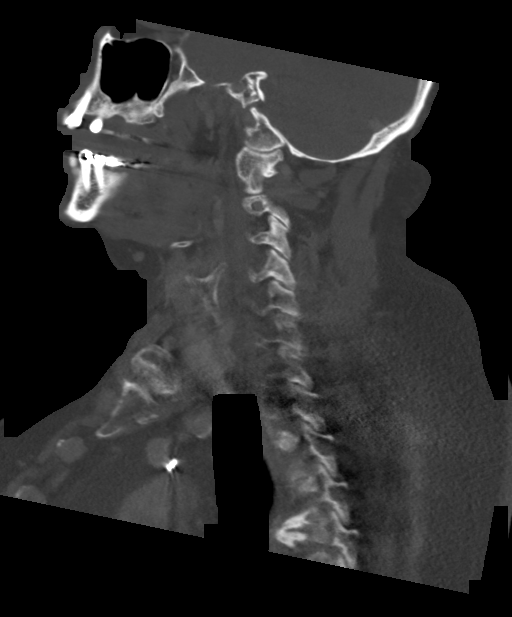
[im 52/104  soft-tissue]
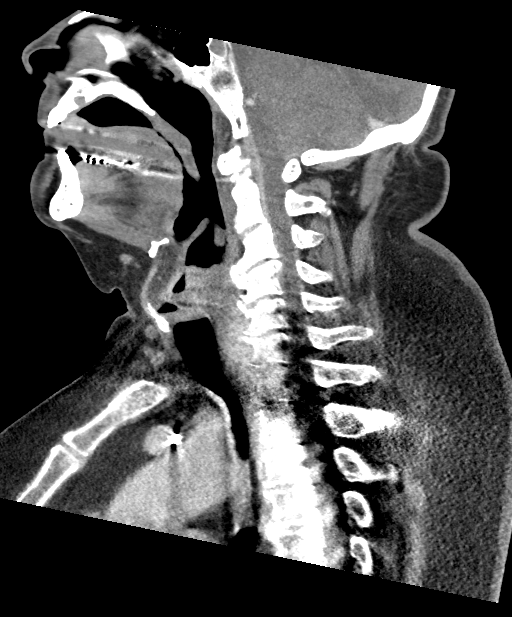
[im 52/104  bone]
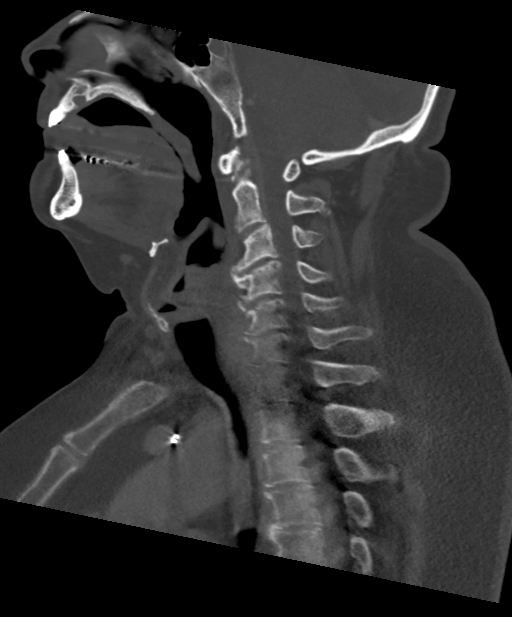
[im 61/104  bone]
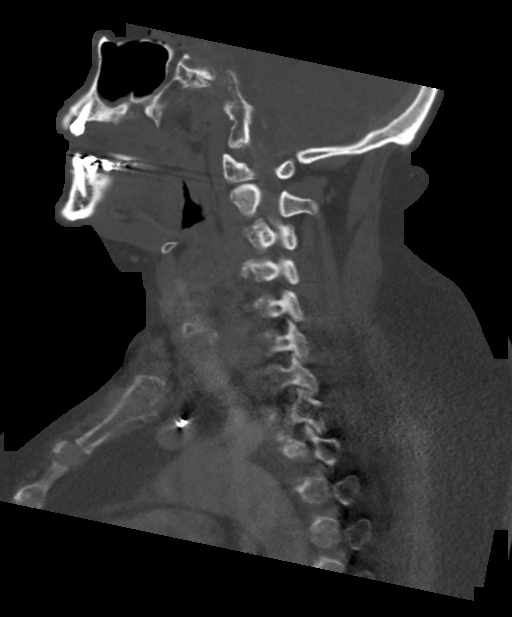
[im 69/104  bone]
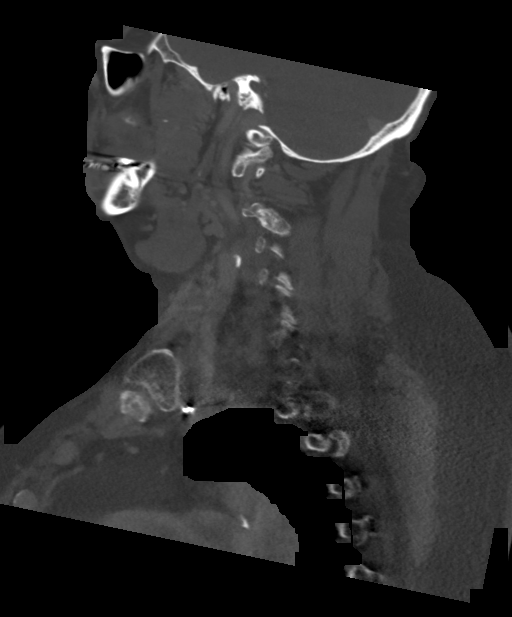

[Series 7: cor neck · coronal · 0.40mm/px · 3 of 137 slices shown]
[im 33/137  bone]
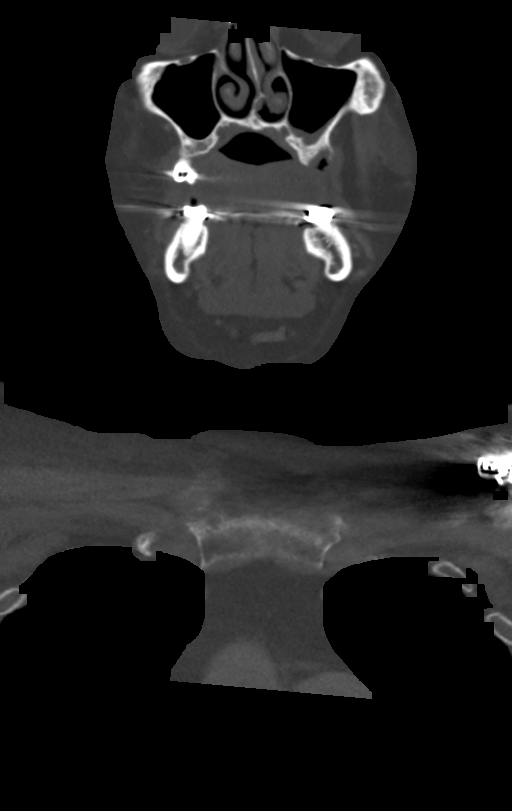
[im 57/137  bone]
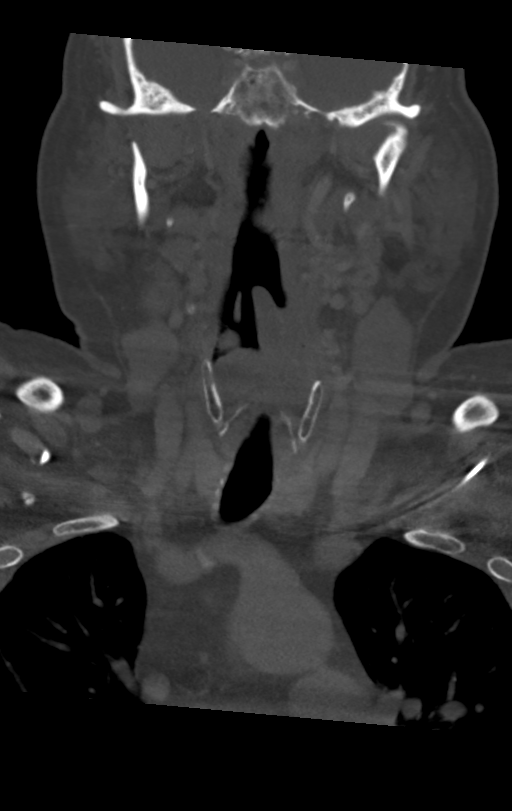
[im 81/137  bone]
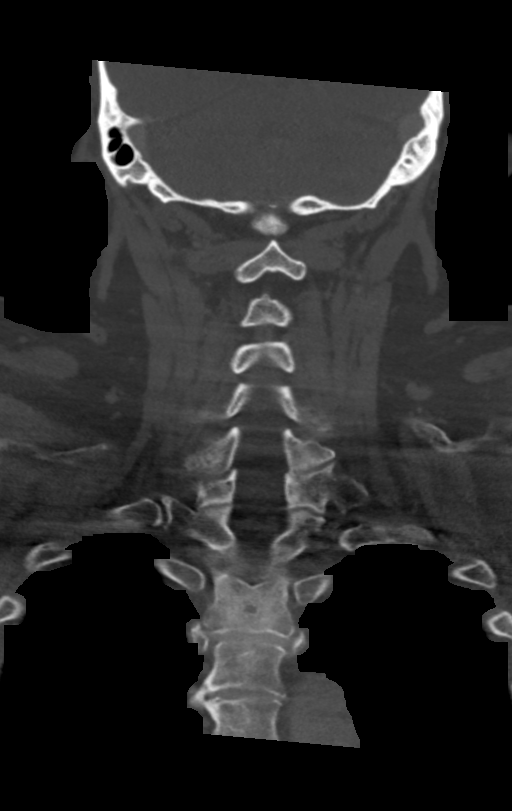

[Series 8: orthogonal ax · axial · 0.40mm/px · z∈[-432,-219]mm · 4 of 165 slices shown, 5 images]
[im 28/165  soft-tissue]
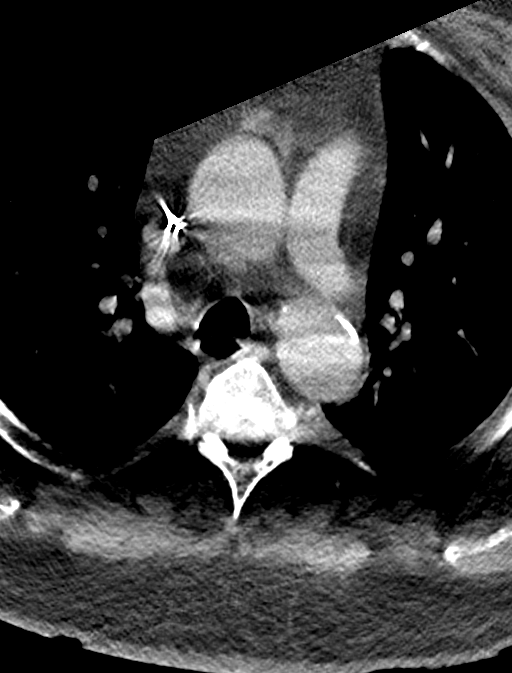
[im 28/165  bone]
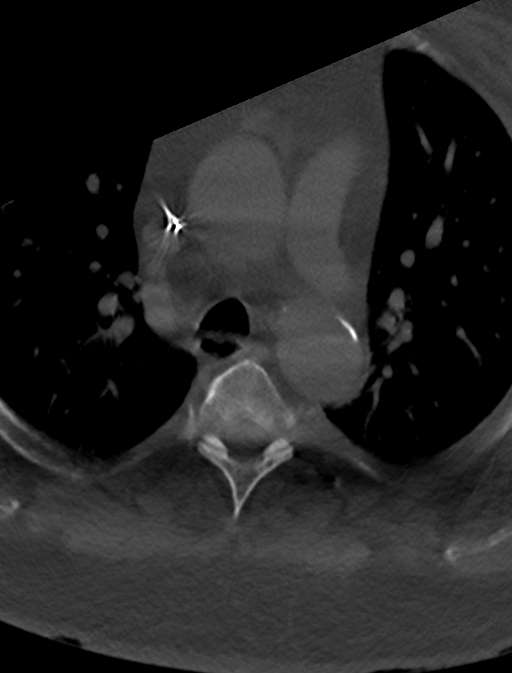
[im 55/165  bone]
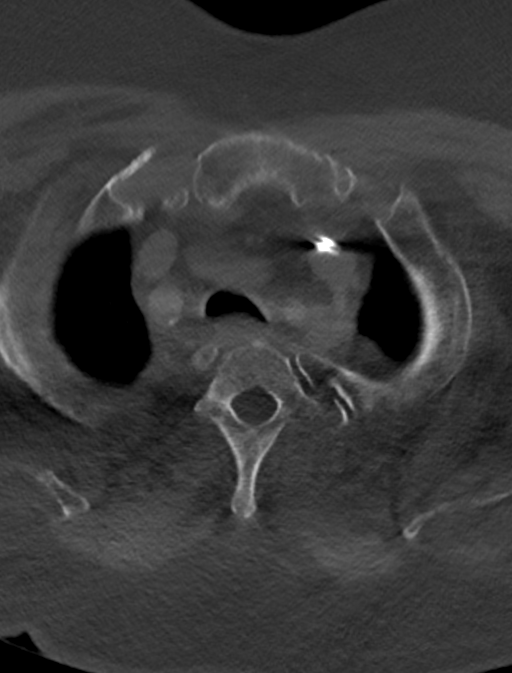
[im 110/165  bone]
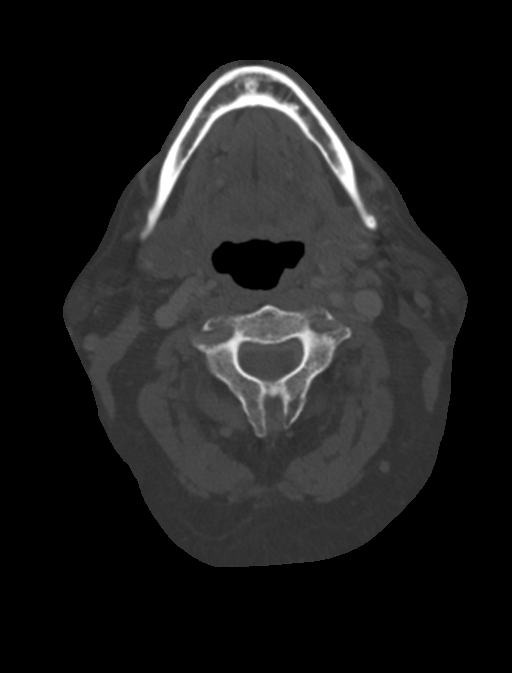
[im 137/165  bone]
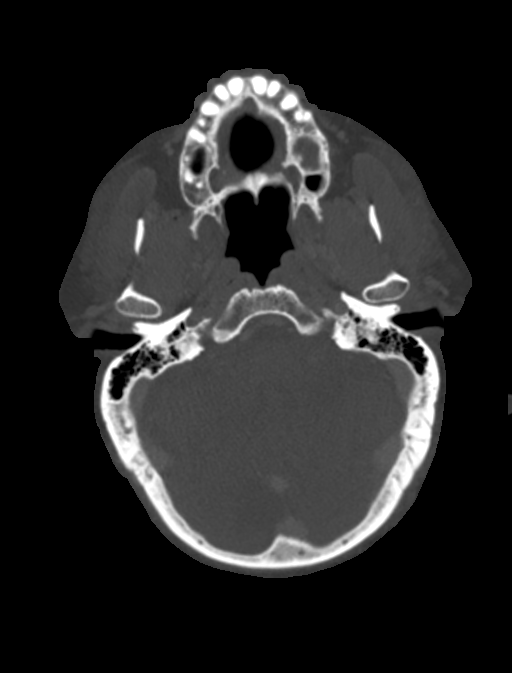

[12 of 35 positions shown; findings below may reference images not displayed]

FINDINGS: Pharynx and larynx: There is asymmetric diffuse low-density
thickening of the left epiglottis and aryepiglottic fold. More
circumferential/bilateral supraglottic soft tissue thickening is
also present more inferiorly. There is resultant severe narrowing of
the supraglottic airway. No discrete hyperenhancing mass is
identified. The palatine tonsils are symmetric without evidence of
significant swelling. No parapharyngeal or retropharyngeal fluid
collection is identified.

Salivary glands: No inflammation, mass, or stone.

Thyroid: Unremarkable.

Lymph nodes: No enlarged or suspicious lymph nodes in the neck.

Vascular: Calcified atherosclerosis at the carotid bifurcations.
Chronic right ICA occlusion at its origin.

Limited intracranial: Chronic medial right occipital infarct.

Visualized orbits: Right cataract extraction.

Mastoids and visualized paranasal sinuses: Trace left maxillary
sinus mucosal thickening. Clear mastoid air cells.

Skeleton: Moderate cervical spondylosis. Advanced asymmetric left
facet arthrosis at C2-3 with facet ankylosis.

Upper chest: Clear lung apices.

Other: None.
IMPRESSION: 1. Diffuse supraglottic soft tissue thickening/swelling with
asymmetric involvement of the epiglottis on the left and resultant
severe supraglottic airway narrowing. Findings are concerning for
infectious supraglottitis with neoplasm a less likely consideration.
No discrete enhancing mass.
2. Chronic right ICA occlusion.
3. Chronic right PCA infarct.

These results were called by telephone at the time of interpretation
on 03/30/2017 at [DATE] to Dr. Foffa, who verbally acknowledged
these results.

## 2019-06-08 NOTE — Telephone Encounter (Signed)
Patient advised and verbalized understanding 

## 2019-06-08 NOTE — Telephone Encounter (Signed)
The original request from 06/07/2019 states that patient wanted something stronger than the anti fungal miconazole nitrate 2% powder  that he used 1 year ago in the hospital, so that's why the cream was sent into the pharmacy yesterday. Dr. Caryn Section is out of the office until Friday. Tried calling patient to find out if he wants to try using the cream, or wait until Dr. Caryn Section returns on Friday to request a prescription for the powder? Left message to call back. OK for PEC to advise.

## 2019-06-08 NOTE — Telephone Encounter (Signed)
Patient returned call and stated that the rx was for cream. He has used that in the past and it did not work. Patient stated that his wife went to [facility] in [place] and bought the powder. Patient states he does not need another rx now, but thank you.

## 2019-06-08 NOTE — Telephone Encounter (Signed)
Patient stated that the furosemide 20 mg that he was prescribed for swelling in hands and feet is not helping. Patient still has a lot of swelling. Patient wanted to know what else he can try to help with the swelling? Patient is aware Dr. Caryn Section is out of the office until Friday and he is okay with waiting for a reply until then. Please advise?

## 2019-06-08 NOTE — Telephone Encounter (Signed)
fiCopied from [place] [id number]. Topic: General - Inquiry >> Jun 08, 2019  8:49 AM Christian Berg, NT wrote: Reason for CRM: Patient called in to inform [place] that the fluid pill he takes is not 59m as he thought but 23m Please advise.

## 2019-06-08 NOTE — Telephone Encounter (Signed)
This should improve after being back on Jardiance for a while and when iron levels come back up. In the meantime, he can take a second furosemide 2-3 days a week if swelling is uncomfortable.

## 2019-06-16 ENCOUNTER — Ambulatory Visit: Payer: Self-pay | Admitting: *Deleted

## 2019-06-16 ENCOUNTER — Ambulatory Visit (INDEPENDENT_AMBULATORY_CARE_PROVIDER_SITE_OTHER): Payer: Medicare HMO | Admitting: Family Medicine

## 2019-06-16 ENCOUNTER — Telehealth: Payer: Self-pay

## 2019-06-16 DIAGNOSIS — I5032 Chronic diastolic (congestive) heart failure: Secondary | ICD-10-CM

## 2019-06-16 DIAGNOSIS — R0602 Shortness of breath: Secondary | ICD-10-CM

## 2019-06-16 NOTE — Progress Notes (Signed)
Virtual telephone visit    Virtual Visit via Telephone Note   This visit type was conducted due to national recommendations for restrictions regarding the COVID-19 Pandemic (e.g. social distancing) in an effort to limit this patient's exposure and mitigate transmission in our community. Due to his co-morbid illnesses, this patient is at least at moderate risk for complications without adequate follow up. This format is felt to be most appropriate for this patient at this time. The patient did not have access to video technology or had technical difficulties with video requiring transitioning to audio format only (telephone). Physical exam was limited to content and character of the telephone converstion.    Patient location: Home Provider location: Office   Visit Date: 06/16/2019  Today's healthcare provider: Vernie Murders, PA   Chief Complaint  Patient presents with  . Shortness of Breath   I,Christian Berg,acting as a Education administrator for [employer], [employer].,have documented all relevant documentation on the behalf of [employer],as directed by  [employer] while in the presence of [employer], [place].  Subjective    Shortness of Breath This is a recurrent problem. Episode onset: 2 days. The problem occurs constantly. The problem has been gradually improving. Pertinent negatives include no chest pain or wheezing. Treatments tried: Albuterol inhaler. The treatment provided moderate relief.  Patient reports difficulty sleeping due to SOB. He thinks he may need a portable O2 tank.      Patient Active Problem List   Diagnosis Date Noted  . Iron deficiency anemia 06/02/2019  . Mixed simple and mucopurulent chronic bronchitis ([place]) 05/31/2019  . Moderate tricuspid regurgitation by prior echocardiogram 12/16/2018  . NASH (nonalcoholic steatohepatitis) 10/22/2015  . History of embolic stroke 96/75/9163  . Morbid (severe) obesity due to excess calories ([place]) 11/22/2014    . VFD (visual field defect) 11/22/2014  . History of TIA (transient ischemic attack) 10/31/2014  . Kidney stones 07/24/2014  . Other cirrhosis of liver ([place]) 07/24/2014  . DSAP (disseminated superficial actinic porokeratosis) 07/17/2014  . Asthma 05/31/2014  . Chronic diastolic heart failure ([place]) 05/31/2014  . Diabetes mellitus ([place]) 05/31/2014  . Hypercholesteremia 05/31/2014  . B12 deficiency 05/31/2014  . Carotid artery narrowing 02/15/2014  . Bilateral carotid artery stenosis 02/15/2014  . Chronic atrial fibrillation ([place]) 10/24/2013  . Benign prostatic hyperplasia with urinary obstruction 09/06/2012  . Heart disease 05/29/2009  . Benign essential HTN 09/29/2008  . Narrowing of intervertebral disc space 01/11/2007   Past Medical History:  Diagnosis Date  . A-fib ([place])   . Carotid stenosis   . CHF (congestive heart failure) ([place])   . Degenerative disc disease, lumbar    s/p injury  . Diabetes mellitus without complication ([place])   . Disseminated superficial actinic porokeratosis   . Dysrhythmia    A-FIB, palpatations  . 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 Fatty liver   . History of degenerative disc disease   . History of kidney stones   . Hypercholesteremia   . Hypertension   . Kidney stones   . Obesity   . Pacemaker    inactive  . Peripheral vascular disease ([place])    Carotid stenosis  . Presence of permanent cardiac pacemaker    Inactive  . TIA (transient ischemic attack) 2011   No deficits  . TIA (transient ischemic attack)   . Vitamin B12 deficiency    Past Surgical History:  Procedure Laterality Date  . APPENDECTOMY  2004  . CARDIAC CATHETERIZATION    . CARDIAC PACEMAKER  PLACEMENT  2005  . CATARACT EXTRACTION W/PHACO Right 06/14/2014   Procedure: CATARACT EXTRACTION PHACO AND INTRAOCULAR LENS PLACEMENT (IOC);  Surgeon: Leandrew Koyanagi, MD;  Location: [facility];  Service: Ophthalmology;  Laterality: Right;  . CATARACT EXTRACTION W/PHACO Left 10/27/2018    Procedure: CATARACT EXTRACTION PHACO AND INTRAOCULAR LENS PLACEMENT (IOC) LEFT DIABETIC  00:46.4  18.6%  8.63;  Surgeon: Leandrew Koyanagi, MD;  Location: [facility];  Service: Ophthalmology;  Laterality: Left;  Diabetic - oral meds  . CYSTOSCOPY W/ RETROGRADES Bilateral 08/09/2014   Procedure: CYSTOSCOPY WITH RETROGRADE PYELOGRAM;  Surgeon: Hollice Espy, MD;  Location: [facility];  Service: Urology;  Laterality: Bilateral;  . INTUBATION-ENDOTRACHEAL WITH TRACHEOSTOMY STANDBY  03/30/2017   Procedure: INTUBATION-ENDOTRACHEAL WITH TRACHEOSTOMY STANDBY;  Surgeon: Carloyn Manner, MD;  Location: [facility];  Service: ENT;;   Social History   Tobacco Use  . Smoking status: Never Smoker  . Smokeless tobacco: Never Used  Substance Use Topics  . Alcohol use: No    Alcohol/week: 0.0 standard drinks  . Drug use: No   Family Status  Relation Name Status  . Mother  Deceased  . Father  Deceased  . Sister  Alive  . Brother  Alive  . Daughter  Alive  . Son  Alive  . Neg Hx  (Not Specified)   Allergies  Allergen Reactions  . Clindamycin/Lincomycin     Chest discomfort  . Sulfa Antibiotics Hives  . Sulfasalazine Hives      Medications: Outpatient Medications Prior to Visit  Medication Sig  . albuterol (VENTOLIN HFA) 108 (90 Base) MCG/ACT inhaler Inhale 2 puffs into the lungs every 6 (six) hours as needed for wheezing or shortness of breath.  [name] aspirin EC 81 MG tablet Take 81 mg by mouth every evening.   . brimonidine (ALPHAGAN) 0.2 % ophthalmic solution brimonidine 0.2 % eye drops  INSTILL 1 DROP INTO EACH EYE TWICE DAILY  . clotrimazole (CLOTRIMAZOLE AF) 1 % cream Apply 1 application topically 2 (two) times daily.  . empagliflozin (JARDIANCE) 10 MG TABS tablet Take 10 mg by mouth daily before breakfast.  . ferrous sulfate 325 (65 FE) MG tablet Take 325 mg by mouth daily with breakfast.  . furosemide (LASIX) 20 MG tablet Take 1 tablet (20 mg total) by mouth daily.  [name]  glipiZIDE (GLUCOTROL) 10 MG tablet TAKE 1 TABLET BY MOUTH TWICE DAILY BEFORE MEAL(S)  . ketoconazole (NIZORAL) 2 % cream Apply 1 application topically daily.  [name] lidocaine (LIDODERM) 5 % Place 1 patch onto the skin every 12 (twelve) hours. Remove & Discard patch within 12 hours or as directed by MD  . lisinopril (ZESTRIL) 20 MG tablet Take 1 tablet (20 mg total) by mouth daily.  [name] lovastatin (MEVACOR) 20 MG tablet Take 1 tablet (20 mg total) by mouth every evening.  . metFORMIN (GLUCOPHAGE) 1000 MG tablet TAKE 1 TABLET BY MOUTH TWICE DAILY WITH MEALS  . metoprolol tartrate (LOPRESSOR) 100 MG tablet Take 1 tablet (100 mg total) by mouth at bedtime. (Patient taking differently: Take 200 mg by mouth 2 (two) times daily. )  . nystatin cream (MYCOSTATIN) Apply 1 application topically 2 (two) times daily.  . traMADol (ULTRAM-ER) 100 MG 24 hr tablet Take 100 mg by mouth daily.   [name] warfarin (COUMADIN) 5 MG tablet Take as directed by your provider  . warfarin (COUMADIN) 7.5 MG tablet Take 1 tablet by mouth once daily   No facility-administered medications prior to visit.    Review  of Systems  Constitutional: Negative.   Respiratory: Positive for shortness of breath. Negative for wheezing.   Cardiovascular: Negative.  Negative for chest pain.  Musculoskeletal: Negative.       Objective    There were no vitals taken for this visit.     Assessment & Plan     1. Shortness of breath Onset over the past 2 days. Mild relief from Albuterol Inhaler but states he can't lie down due to dyspnea. No recent fever, congestion or loss of sense of taste. Suspect secondary to heart failure and need a little more diuresis.   2. Chronic diastolic heart failure ([place]) Followed by Dr. Nehemiah Massed (cardiologist) with history of chronic atrial fibrillation. Presently on Coumadin anticoagulant therapy. Presently on Jardiance  10 mg qd, Lisinopril 40 mg qd and Lasix 20 mg qd. Suspect worsening of CHF and recommend an  additional dose of diuretic. Advised to contact cardiologist or go to the ER if weakness, dyspnea or palpitations persist.    No follow-ups on file.    I discussed the assessment and treatment plan with the patient. The patient was provided an opportunity to ask questions and all were answered. The patient agreed with the plan and demonstrated an understanding of the instructions.   The patient was advised to call back or seek an in-person evaluation if the symptoms worsen or if the condition fails to improve as anticipated.  I provided 13 minutes of non-face-to-face time during this encounter.  Andres Shad, PA, have reviewed all documentation for this visit. The documentation on 06/28/19 for the exam, diagnosis, procedures, and orders are all accurate and complete.   Vernie Murders, [place] [phone] (phone) [phone] (fax)  [place]

## 2019-06-16 NOTE — Telephone Encounter (Signed)
Copied from [place] [id number]. Topic: General - Other >> Jun 16, 2019  8:23 AM Leward Quan A wrote: Reason for CRM: Patient called to request a conference call with Dr Caryn Section about the issues that he is having with his breathing. States that he may need a small oxygen tank. Per patient he was up most of the night struggling because he could not find comfort to breathe right. Ph# [phone]

## 2019-06-16 NOTE — Telephone Encounter (Signed)
Pt called in c/o being short of breath that started during the night.   "This is a reoccurring problem that Dr. Caryn Section is aware of".   "I have these flare ups every so often".   "One time I ended up in the hospital with a breathing tube".   "I have CHF too".  See triage notes below.  I have made him a telephone/virtual visit per the guidelines from the COVID-19 questionnaire due to his shortness of breath.  He does not use the computer but he has done telephone calls before with his doctors. I scheduled him for today with Vernie Murders, PA at 11:20.   Dr. Caryn Section is booked.  I sent my notes to [facility].   Reason for Disposition . [1] Longstanding difficulty breathing AND [2] not responding to usual therapy  Answer Assessment - Initial Assessment Questions 1. RESPIRATORY STATUS: "Describe your breathing?" (e.g., wheezing, shortness of breath, unable to speak, severe coughing)      This is a reoccuring problem.  It's been a month since it happened.   I have to sit in a lift chair to rest.  Last night I got up and got on the couch.    2. ONSET: "When did this breathing problem begin?"      It reoccurs.   I've been to the hospital one time and had a breathing tube.   I just did a hit form my inhaler and it has helped some. 3. PATTERN "Does the difficult breathing come and go, or has it been constant since it started?"      It comes and goes.   Last night it restarted. 4. SEVERITY: "How bad is your breathing?" (e.g., mild, moderate, severe)    - MILD: No SOB at rest, mild SOB with walking, speaks normally in sentences, can lay down, no retractions, pulse < 100.    - MODERATE: SOB at rest, SOB with minimal exertion and prefers to sit, cannot lie down flat, speaks in phrases, mild retractions, audible wheezing, pulse 100-120.    - SEVERE: Very SOB at rest, speaks in single words, struggling to breathe, sitting hunched forward, retractions, pulse > 120      I'm laying down now and  doing ok after the inhaler. 5. RECURRENT SYMPTOM: "Have you had difficulty breathing before?" If so, ask: "When was the last time?" and "What happened that time?"      Yes 6. CARDIAC HISTORY: "Do you have any history of heart disease?" (e.g., heart attack, angina, bypass surgery, angioplasty)      I have hypertension and CHF.    7. LUNG HISTORY: "Do you have any history of lung disease?"  (e.g., pulmonary embolus, asthma, emphysema)     See above.   They don't know what is causing it. 8. CAUSE: "What do you think is causing the breathing problem?"      It comes and goes. 9. OTHER SYMPTOMS: "Do you have any other symptoms? (e.g., dizziness, runny nose, cough, chest pain, fever)     Last night when I got up I got dizzy and almost fell.   That's only time it happened.   This happens occasionally when I'm having trouble breathing. 10. PREGNANCY: "Is there any chance you are pregnant?" "When was your last menstrual period?"       N/A 11. TRAVEL: "Have you traveled out of the country in the last month?" (e.g., travel history, exposures)       No   I've had  both COVID-19 vaccines.   Moderna.  Protocols used: BREATHING DIFFICULTY-A-AH

## 2019-06-18 ENCOUNTER — Other Ambulatory Visit: Payer: Self-pay | Admitting: Family Medicine

## 2019-06-18 DIAGNOSIS — R609 Edema, unspecified: Secondary | ICD-10-CM

## 2019-06-18 NOTE — Telephone Encounter (Signed)
Pt has upcoming appt Requested Prescriptions  Pending Prescriptions Disp Refills  . furosemide (LASIX) 20 MG tablet [Pharmacy Med Name: Furosemide 20 MG Oral Tablet] 90 tablet 0    Sig: Take 1 tablet by mouth once daily     Cardiovascular:  Diuretics - Loop Failed - 06/18/2019 10:25 AM      Failed - Last BP in normal range    BP Readings from Last 1 Encounters:  05/31/19 (!) 152/92         Passed - K in normal range and within 360 days    Potassium  Date Value Ref Range Status  05/31/2019 4.6 3.5 - 5.2 mmol/L Final  09/16/2013 3.4 (L) 3.5 - 5.1 mmol/L Final         Passed - Ca in normal range and within 360 days    Calcium  Date Value Ref Range Status  05/31/2019 8.9 8.6 - 10.2 mg/dL Final   Calcium, Total  Date Value Ref Range Status  09/16/2013 7.7 (L) 8.5 - 10.1 mg/dL Final   Calcium, Ion  Date Value Ref Range Status  09/15/2015 1.10 (L) 1.12 - 1.23 mmol/L Final         Passed - Na in normal range and within 360 days    Sodium  Date Value Ref Range Status  05/31/2019 136 134 - 144 mmol/L Final  09/16/2013 142 136 - 145 mmol/L Final         Passed - Cr in normal range and within 360 days    Creatinine  Date Value Ref Range Status  09/17/2013 0.89 0.60 - 1.30 mg/dL Final   Creatinine, Ser  Date Value Ref Range Status  05/31/2019 1.27 0.76 - 1.27 mg/dL Final         Passed - Valid encounter within last 6 months    Recent Outpatient Visits          2 days ago Shortness of breath   [facility], [place], [place]   2 weeks ago Type 2 diabetes mellitus with other skin complication, without long-term current use of insulin [facility])   [facility] Birdie Sons, MD   2 months ago [place], [place], [place]   2 months ago Candida infection of flexural skin   [facility], [place], [place]   3 months ago Benign essential HTN   [facility] Birdie Sons, MD       Future Appointments            In 2 months Fisher, Kirstie Peri, MD [facility], [place]

## 2019-07-05 DIAGNOSIS — I482 Chronic atrial fibrillation, unspecified: Secondary | ICD-10-CM | POA: Diagnosis not present

## 2019-07-05 DIAGNOSIS — I6523 Occlusion and stenosis of bilateral carotid arteries: Secondary | ICD-10-CM | POA: Diagnosis not present

## 2019-07-05 DIAGNOSIS — I5042 Chronic combined systolic (congestive) and diastolic (congestive) heart failure: Secondary | ICD-10-CM | POA: Diagnosis not present

## 2019-07-13 ENCOUNTER — Other Ambulatory Visit: Payer: Self-pay

## 2019-07-13 ENCOUNTER — Encounter: Payer: Self-pay | Admitting: Emergency Medicine

## 2019-07-13 ENCOUNTER — Emergency Department: Payer: Medicare HMO

## 2019-07-13 ENCOUNTER — Emergency Department
Admission: EM | Admit: 2019-07-13 | Discharge: 2019-07-13 | Disposition: A | Discharge status: Home / Self Care | Payer: Medicare HMO | Attending: Emergency Medicine | Admitting: Emergency Medicine

## 2019-07-13 DIAGNOSIS — I517 Cardiomegaly: Secondary | ICD-10-CM | POA: Diagnosis not present

## 2019-07-13 DIAGNOSIS — R0602 Shortness of breath: Secondary | ICD-10-CM | POA: Insufficient documentation

## 2019-07-13 DIAGNOSIS — Z5321 Procedure and treatment not carried out due to patient leaving prior to being seen by health care provider: Secondary | ICD-10-CM | POA: Diagnosis not present

## 2019-07-13 DIAGNOSIS — R609 Edema, unspecified: Secondary | ICD-10-CM | POA: Insufficient documentation

## 2019-07-13 DIAGNOSIS — J811 Chronic pulmonary edema: Secondary | ICD-10-CM | POA: Diagnosis not present

## 2019-07-13 LAB — BASIC METABOLIC PANEL
Anion gap: 10 (ref 5–15)
BUN: 15 mg/dL (ref 8–23)
CO2: 22 mmol/L (ref 22–32)
Calcium: 8.9 mg/dL (ref 8.9–10.3)
Chloride: 106 mmol/L (ref 98–111)
Creatinine, Ser: 1.45 mg/dL — ABNORMAL HIGH (ref 0.61–1.24)
GFR calc Af Amer: 56 mL/min — ABNORMAL LOW (ref >60)
GFR calc non Af Amer: 48 mL/min — ABNORMAL LOW (ref >60)
Glucose, Bld: 126 mg/dL — ABNORMAL HIGH (ref 70–99)
Potassium: 4.4 mmol/L (ref 3.5–5.1)
Sodium: 138 mmol/L (ref 135–145)

## 2019-07-13 LAB — CBC
HCT: 30 % — ABNORMAL LOW (ref 39.0–52.0)
Hemoglobin: 9.1 g/dL — ABNORMAL LOW (ref 13.0–17.0)
MCH: 29.9 pg (ref 26.0–34.0)
MCHC: 30.3 g/dL (ref 30.0–36.0)
MCV: 98.7 fL (ref 80.0–100.0)
Platelets: 206 10*3/uL (ref 150–400)
RBC: 3.04 MIL/uL — ABNORMAL LOW (ref 4.22–5.81)
RDW: 14.9 % (ref 11.5–15.5)
WBC: 5 10*3/uL (ref 4.0–10.5)
nRBC: 0 % (ref 0.0–0.2)

## 2019-07-13 LAB — TROPONIN I (HIGH SENSITIVITY): Troponin I (High Sensitivity): 20 ng/L — ABNORMAL HIGH (ref <18)

## 2019-07-13 LAB — BRAIN NATRIURETIC PEPTIDE: B Natriuretic Peptide: 344.7 pg/mL — ABNORMAL HIGH (ref 0.0–100.0)

## 2019-07-13 NOTE — ED Notes (Signed)
Full rainbow drawn

## 2019-07-13 NOTE — ED Triage Notes (Signed)
Patient reports he was being seen by his worker's comp doctor and they sent him here for SOB and fluid retention. Patient reports these symptoms have been worsening for the last month.

## 2019-07-14 DIAGNOSIS — I10 Essential (primary) hypertension: Secondary | ICD-10-CM | POA: Diagnosis not present

## 2019-07-14 DIAGNOSIS — I5042 Chronic combined systolic (congestive) and diastolic (congestive) heart failure: Secondary | ICD-10-CM | POA: Diagnosis not present

## 2019-07-14 DIAGNOSIS — I495 Sick sinus syndrome: Secondary | ICD-10-CM | POA: Diagnosis not present

## 2019-07-14 DIAGNOSIS — E782 Mixed hyperlipidemia: Secondary | ICD-10-CM | POA: Diagnosis not present

## 2019-07-14 DIAGNOSIS — I739 Peripheral vascular disease, unspecified: Secondary | ICD-10-CM | POA: Diagnosis not present

## 2019-07-14 DIAGNOSIS — I482 Chronic atrial fibrillation, unspecified: Secondary | ICD-10-CM | POA: Diagnosis not present

## 2019-07-14 DIAGNOSIS — I6523 Occlusion and stenosis of bilateral carotid arteries: Secondary | ICD-10-CM | POA: Diagnosis not present

## 2019-08-03 ENCOUNTER — Inpatient Hospital Stay
Admission: EM | Admit: 2019-08-03 | Discharge: 2019-08-06 | DRG: 896 | Disposition: A | Discharge status: Home / Self Care | Payer: Medicare HMO | Attending: Internal Medicine | Admitting: Internal Medicine

## 2019-08-03 ENCOUNTER — Other Ambulatory Visit: Payer: Self-pay

## 2019-08-03 DIAGNOSIS — F1123 Opioid dependence with withdrawal: Principal | ICD-10-CM | POA: Diagnosis present

## 2019-08-03 DIAGNOSIS — I482 Chronic atrial fibrillation, unspecified: Secondary | ICD-10-CM | POA: Diagnosis present

## 2019-08-03 DIAGNOSIS — G8929 Other chronic pain: Secondary | ICD-10-CM | POA: Diagnosis present

## 2019-08-03 DIAGNOSIS — I4891 Unspecified atrial fibrillation: Secondary | ICD-10-CM | POA: Diagnosis not present

## 2019-08-03 DIAGNOSIS — Z20822 Contact with and (suspected) exposure to covid-19: Secondary | ICD-10-CM | POA: Diagnosis present

## 2019-08-03 DIAGNOSIS — Z7984 Long term (current) use of oral hypoglycemic drugs: Secondary | ICD-10-CM

## 2019-08-03 DIAGNOSIS — N183 Chronic kidney disease, stage 3 unspecified: Secondary | ICD-10-CM

## 2019-08-03 DIAGNOSIS — N1831 Chronic kidney disease, stage 3a: Secondary | ICD-10-CM | POA: Diagnosis present

## 2019-08-03 DIAGNOSIS — Z888 Allergy status to other drugs, medicaments and biological substances status: Secondary | ICD-10-CM

## 2019-08-03 DIAGNOSIS — E785 Hyperlipidemia, unspecified: Secondary | ICD-10-CM | POA: Diagnosis present

## 2019-08-03 DIAGNOSIS — M5136 Other intervertebral disc degeneration, lumbar region: Secondary | ICD-10-CM | POA: Diagnosis present

## 2019-08-03 DIAGNOSIS — M545 Low back pain, unspecified: Secondary | ICD-10-CM

## 2019-08-03 DIAGNOSIS — I5033 Acute on chronic diastolic (congestive) heart failure: Secondary | ICD-10-CM | POA: Diagnosis present

## 2019-08-03 DIAGNOSIS — R197 Diarrhea, unspecified: Secondary | ICD-10-CM | POA: Diagnosis present

## 2019-08-03 DIAGNOSIS — E538 Deficiency of other specified B group vitamins: Secondary | ICD-10-CM | POA: Diagnosis present

## 2019-08-03 DIAGNOSIS — Z8673 Personal history of transient ischemic attack (TIA), and cerebral infarction without residual deficits: Secondary | ICD-10-CM

## 2019-08-03 DIAGNOSIS — Z950 Presence of cardiac pacemaker: Secondary | ICD-10-CM

## 2019-08-03 DIAGNOSIS — E872 Acidosis: Secondary | ICD-10-CM | POA: Diagnosis present

## 2019-08-03 DIAGNOSIS — E119 Type 2 diabetes mellitus without complications: Secondary | ICD-10-CM

## 2019-08-03 DIAGNOSIS — I5030 Unspecified diastolic (congestive) heart failure: Secondary | ICD-10-CM

## 2019-08-03 DIAGNOSIS — B356 Tinea cruris: Secondary | ICD-10-CM

## 2019-08-03 DIAGNOSIS — D509 Iron deficiency anemia, unspecified: Secondary | ICD-10-CM | POA: Diagnosis present

## 2019-08-03 DIAGNOSIS — K7469 Other cirrhosis of liver: Secondary | ICD-10-CM | POA: Diagnosis present

## 2019-08-03 DIAGNOSIS — I10 Essential (primary) hypertension: Secondary | ICD-10-CM | POA: Diagnosis present

## 2019-08-03 DIAGNOSIS — D6859 Other primary thrombophilia: Secondary | ICD-10-CM | POA: Diagnosis present

## 2019-08-03 DIAGNOSIS — E1151 Type 2 diabetes mellitus with diabetic peripheral angiopathy without gangrene: Secondary | ICD-10-CM | POA: Diagnosis present

## 2019-08-03 DIAGNOSIS — K7581 Nonalcoholic steatohepatitis (NASH): Secondary | ICD-10-CM | POA: Diagnosis present

## 2019-08-03 DIAGNOSIS — B372 Candidiasis of skin and nail: Secondary | ICD-10-CM

## 2019-08-03 DIAGNOSIS — Z6841 Body Mass Index (BMI) 40.0 and over, adult: Secondary | ICD-10-CM

## 2019-08-03 DIAGNOSIS — Z7982 Long term (current) use of aspirin: Secondary | ICD-10-CM

## 2019-08-03 DIAGNOSIS — E1122 Type 2 diabetes mellitus with diabetic chronic kidney disease: Secondary | ICD-10-CM | POA: Diagnosis present

## 2019-08-03 DIAGNOSIS — E860 Dehydration: Secondary | ICD-10-CM | POA: Diagnosis present

## 2019-08-03 DIAGNOSIS — R000 Tachycardia, unspecified: Secondary | ICD-10-CM

## 2019-08-03 DIAGNOSIS — J45909 Unspecified asthma, uncomplicated: Secondary | ICD-10-CM | POA: Diagnosis present

## 2019-08-03 DIAGNOSIS — D6869 Other thrombophilia: Secondary | ICD-10-CM

## 2019-08-03 DIAGNOSIS — Z882 Allergy status to sulfonamides status: Secondary | ICD-10-CM

## 2019-08-03 DIAGNOSIS — Z7901 Long term (current) use of anticoagulants: Secondary | ICD-10-CM

## 2019-08-03 DIAGNOSIS — I130 Hypertensive heart and chronic kidney disease with heart failure and stage 1 through stage 4 chronic kidney disease, or unspecified chronic kidney disease: Secondary | ICD-10-CM | POA: Diagnosis present

## 2019-08-03 DIAGNOSIS — Z8249 Family history of ischemic heart disease and other diseases of the circulatory system: Secondary | ICD-10-CM

## 2019-08-03 DIAGNOSIS — Z79899 Other long term (current) drug therapy: Secondary | ICD-10-CM

## 2019-08-03 LAB — CBC
HCT: 31.4 % — ABNORMAL LOW (ref 39.0–52.0)
Hemoglobin: 9.7 g/dL — ABNORMAL LOW (ref 13.0–17.0)
MCH: 29 pg (ref 26.0–34.0)
MCHC: 30.9 g/dL (ref 30.0–36.0)
MCV: 94 fL (ref 80.0–100.0)
Platelets: 242 10*3/uL (ref 150–400)
RBC: 3.34 MIL/uL — ABNORMAL LOW (ref 4.22–5.81)
RDW: 16.6 % — ABNORMAL HIGH (ref 11.5–15.5)
WBC: 7 10*3/uL (ref 4.0–10.5)
nRBC: 0 % (ref 0.0–0.2)

## 2019-08-03 LAB — COMPREHENSIVE METABOLIC PANEL
ALT: 27 U/L (ref 0–44)
AST: 88 U/L — ABNORMAL HIGH (ref 15–41)
Albumin: 3.2 g/dL — ABNORMAL LOW (ref 3.5–5.0)
Alkaline Phosphatase: 68 U/L (ref 38–126)
Anion gap: 12 (ref 5–15)
BUN: 16 mg/dL (ref 8–23)
CO2: 21 mmol/L — ABNORMAL LOW (ref 22–32)
Calcium: 9 mg/dL (ref 8.9–10.3)
Chloride: 105 mmol/L (ref 98–111)
Creatinine, Ser: 1.32 mg/dL — ABNORMAL HIGH (ref 0.61–1.24)
GFR calc Af Amer: >60 mL/min (ref >60)
GFR calc non Af Amer: 54 mL/min — ABNORMAL LOW (ref >60)
Glucose, Bld: 147 mg/dL — ABNORMAL HIGH (ref 70–99)
Potassium: 4 mmol/L (ref 3.5–5.1)
Sodium: 138 mmol/L (ref 135–145)
Total Bilirubin: 1.5 mg/dL — ABNORMAL HIGH (ref 0.3–1.2)
Total Protein: 7.5 g/dL (ref 6.5–8.1)

## 2019-08-03 LAB — LIPASE, BLOOD: Lipase: 59 U/L — ABNORMAL HIGH (ref 11–51)

## 2019-08-03 MED ORDER — TRAMADOL HCL 50 MG PO TABS
50.0000 mg | ORAL_TABLET | Freq: Once | ORAL | Status: AC
  Administered 2019-08-03: 50 mg via ORAL
  Filled 2019-08-03: qty 1

## 2019-08-03 MED ORDER — SODIUM CHLORIDE 0.9 % IV SOLN: Freq: Once | INTRAVENOUS | Status: AC

## 2019-08-03 MED ORDER — ONDANSETRON HCL 4 MG/2ML IJ SOLN
4.0000 mg | Freq: Once | INTRAMUSCULAR | Status: DC
  Filled 2019-08-03: qty 2

## 2019-08-03 MED ORDER — ONDANSETRON 4 MG PO TBDP: 4.0000 mg | ORAL_TABLET | Freq: Once | ORAL | Status: DC

## 2019-08-03 MED ORDER — SODIUM CHLORIDE 0.9% FLUSH
3.0000 mL | Freq: Once | INTRAVENOUS | Status: AC
  Administered 2019-08-04: 3 mL via INTRAVENOUS

## 2019-08-03 NOTE — ED Notes (Signed)
Pt assisted in restroom. Large amount of diarrhea noted in toilet and depends. cleaned pt and placed new depends. Pt c/o feeling weak. Pt appears weak and dyspneic. Placed back into wheelchair and provided for comfort and safety.

## 2019-08-03 NOTE — ED Triage Notes (Signed)
Pt arrives via POV for diarrhea x 1 week, pt states stool is dark and "ropey". PT reports 4-5 episodes of diarrhea daily, denies vomiting. PT reports low grade temp and chills. PT sitting in wheelchair in NAD, skin warm and dry.

## 2019-08-03 NOTE — ED Provider Notes (Addendum)
[facility] Emergency Department Provider Note  ____________________________________________   First MD Initiated Contact with Patient 08/03/19 2315     (approximate)  I have reviewed the triage vital signs and the nursing notes.   HISTORY  Chief Complaint Diarrhea    HPI Christian Berg is a 69 y.o. male with history of CHF, A. fib, on Coumadin, hypertension, hyperlipidemia, here with profuse diarrhea and generalized weakness.  The patient states that for the last 5 days, he has had fairly constant, watery, black, foul-smelling diarrhea.  Has had associated intermittent severe abdominal cramping.  He has had some chills but no known fevers.  Denies any bright red blood in his stools.  The symptoms seem to have come on fairly gradually but have persisted and worsened since onset.  Denies any suspicious food intake.  No recent illnesses.  No recent travel.  Denies history of diverticulitis that he is aware of, but has not had a colonoscopy in the past per report.  No recent medication changes, though he states he did run out of his Ultram approximately week ago, prior to the onset of symptoms.  No other acute complaints.  His pain is minimal now.  He states he is having difficulty even walking due to his weakness from his diarrhea, and has been essentially unable to control it at home.        Past Medical History:  Diagnosis Date  . A-fib ([place])   . Carotid stenosis   . CHF (congestive heart failure) ([place])   . Degenerative disc disease, lumbar    s/p injury  . Diabetes mellitus without complication ([place])   . Disseminated superficial actinic porokeratosis   . Dysrhythmia    A-FIB, palpatations  . 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 Fatty liver   . History of degenerative disc disease   . History of kidney stones   . Hypercholesteremia   . Hypertension   . Kidney stones   . Obesity   . Pacemaker    inactive  . Peripheral vascular disease ([place])    Carotid  stenosis  . Presence of permanent cardiac pacemaker    Inactive  . TIA (transient ischemic attack) 2011   No deficits  . TIA (transient ischemic attack)   . Vitamin B12 deficiency     Patient Active Problem List   Diagnosis Date Noted  . Iron deficiency anemia 06/02/2019  . Mixed simple and mucopurulent chronic bronchitis ([place]) 05/31/2019  . Moderate tricuspid regurgitation by prior echocardiogram 12/16/2018  . NASH (nonalcoholic steatohepatitis) 10/22/2015  . History of embolic stroke 74/12/8784  . Morbid (severe) obesity due to excess calories ([place]) 11/22/2014  . VFD (visual field defect) 11/22/2014  . History of TIA (transient ischemic attack) 10/31/2014  . Kidney stones 07/24/2014  . Other cirrhosis of liver ([place]) 07/24/2014  . DSAP (disseminated superficial actinic porokeratosis) 07/17/2014  . Asthma 05/31/2014  . Chronic diastolic heart failure ([place]) 05/31/2014  . Diabetes mellitus ([place]) 05/31/2014  . Hypercholesteremia 05/31/2014  . B12 deficiency 05/31/2014  . Carotid artery narrowing 02/15/2014  . Bilateral carotid artery stenosis 02/15/2014  . Chronic atrial fibrillation ([place]) 10/24/2013  . Benign prostatic hyperplasia with urinary obstruction 09/06/2012  . Heart disease 05/29/2009  . Benign essential HTN 09/29/2008  . Narrowing of intervertebral disc space 01/11/2007    Past Surgical History:  Procedure Laterality Date  . APPENDECTOMY  2004  . CARDIAC CATHETERIZATION    . CARDIAC PACEMAKER PLACEMENT  2005  . CATARACT EXTRACTION W/PHACO Right  06/14/2014   Procedure: CATARACT EXTRACTION PHACO AND INTRAOCULAR LENS PLACEMENT (IOC);  Surgeon: Leandrew Koyanagi, MD;  Location: [place];  Service: Ophthalmology;  Laterality: Right;  . CATARACT EXTRACTION W/PHACO Left 10/27/2018   Procedure: CATARACT EXTRACTION PHACO AND INTRAOCULAR LENS PLACEMENT (IOC) LEFT DIABETIC  00:46.4  18.6%  8.63;  Surgeon: Leandrew Koyanagi, MD;  Location: [place];   Service: Ophthalmology;  Laterality: Left;  Diabetic - oral meds  . CYSTOSCOPY W/ RETROGRADES Bilateral 08/09/2014   Procedure: CYSTOSCOPY WITH RETROGRADE PYELOGRAM;  Surgeon: Hollice Espy, MD;  Location: [facility];  Service: Urology;  Laterality: Bilateral;  . INTUBATION-ENDOTRACHEAL WITH TRACHEOSTOMY STANDBY  03/30/2017   Procedure: INTUBATION-ENDOTRACHEAL WITH TRACHEOSTOMY STANDBY;  Surgeon: Carloyn Manner, MD;  Location: [facility];  Service: ENT;;    Prior to Admission medications   Medication Sig Start Date End Date Taking? Authorizing Provider  albuterol (VENTOLIN HFA) 108 (90 Base) MCG/ACT inhaler Inhale 2 puffs into the lungs every 6 (six) hours as needed for wheezing or shortness of breath. 05/31/19   Birdie Sons, MD  aspirin EC 81 MG tablet Take 81 mg by mouth every evening.     [provider]  brimonidine (ALPHAGAN) 0.2 % ophthalmic solution brimonidine 0.2 % eye drops  INSTILL 1 DROP INTO EACH EYE TWICE DAILY    [provider]  clotrimazole (CLOTRIMAZOLE AF) 1 % cream Apply 1 application topically 2 (two) times daily. 03/25/19   Trinna Post, PA-C  empagliflozin (JARDIANCE) 10 MG TABS tablet Take 10 mg by mouth daily before breakfast. 06/06/19   Birdie Sons, MD  ferrous sulfate 325 (65 FE) MG tablet Take 325 mg by mouth daily with breakfast.    [provider]  furosemide (LASIX) 20 MG tablet Take 1 tablet by mouth once daily 06/18/19   Birdie Sons, MD  glipiZIDE (GLUCOTROL) 10 MG tablet TAKE 1 TABLET BY MOUTH TWICE DAILY BEFORE MEAL(S) 05/15/19   Birdie Sons, MD  ketoconazole (NIZORAL) 2 % cream Apply 1 application topically daily. 06/07/19   Birdie Sons, MD  lidocaine (LIDODERM) 5 % Place 1 patch onto the skin every 12 (twelve) hours. Remove & Discard patch within 12 hours or as directed by MD 12/03/18 12/03/19  Sable Feil, PA-C  lisinopril (ZESTRIL) 20 MG tablet Take 1 tablet (20 mg total) by mouth daily. 04/07/19   Birdie Sons, MD  lovastatin (MEVACOR) 20 MG tablet Take 1 tablet (20 mg total) by mouth every evening. 04/08/19   Birdie Sons, MD  metFORMIN (GLUCOPHAGE) 1000 MG tablet TAKE 1 TABLET BY MOUTH TWICE DAILY WITH MEALS 11/16/18   Birdie Sons, MD  metoprolol tartrate (LOPRESSOR) 100 MG tablet Take 1 tablet (100 mg total) by mouth at bedtime. Patient taking differently: Take 200 mg by mouth 2 (two) times daily.  10/24/16   Demetrios Loll, MD  nystatin cream (MYCOSTATIN) Apply 1 application topically 2 (two) times daily. 07/17/14   Zara Council A, PA-C  traMADol (ULTRAM-ER) 100 MG 24 hr tablet Take 100 mg by mouth daily.  12/13/18   [provider]  warfarin (COUMADIN) 5 MG tablet Take as directed by your provider 03/09/19   Birdie Sons, MD  warfarin (COUMADIN) 7.5 MG tablet Take 1 tablet by mouth once daily 04/07/19   Birdie Sons, MD    Allergies Clindamycin/lincomycin, Sulfa antibiotics, and Sulfasalazine  Family History  Adopted: Yes  Problem Relation Age of Onset  . Heart disease Mother   .  Fibromyalgia Sister   . Hypertension Daughter   . Migraines Daughter   . Kidney disease Neg Hx   . Prostate cancer Neg Hx   . Bladder Cancer Neg Hx     Social History Social History   Tobacco Use  . Smoking status: Never Smoker  . Smokeless tobacco: Never Used  Vaping Use  . Vaping Use: Never used  Substance Use Topics  . Alcohol use: No    Alcohol/week: 0.0 standard drinks  . Drug use: No    Review of Systems  Review of Systems  Constitutional: Positive for fatigue. Negative for chills and fever.  HENT: Negative for sore throat.   Respiratory: Negative for shortness of breath.   Cardiovascular: Negative for chest pain.  Gastrointestinal: Positive for abdominal pain, diarrhea and nausea.  Genitourinary: Negative for flank pain.  Musculoskeletal: Negative for neck pain.  Skin: Negative for rash and wound.  Allergic/Immunologic: Negative for immunocompromised  state.  Neurological: Positive for weakness. Negative for numbness.  Hematological: Does not bruise/bleed easily.  All other systems reviewed and are negative.    ____________________________________________  PHYSICAL EXAM:      VITAL SIGNS: ED Triage Vitals  Enc Vitals Group     BP 08/03/19 1701 137/75     Pulse Rate 08/03/19 1701 (!) 102     Resp 08/03/19 1701 17     Temp 08/03/19 1701 98.4 F (36.9 C)     Temp Source 08/03/19 1701 Oral     SpO2 08/03/19 1701 96 %     Weight 08/03/19 1703 299 lb (135.6 kg)     Height 08/03/19 1703 5' 8"  (1.727 m)     Head Circumference --      Peak Flow --      Pain Score 08/03/19 1706 0     Pain Loc --      Pain Edu? --      Excl. in [place]? --      Physical Exam Vitals and nursing note reviewed.  Constitutional:      General: He is not in acute distress.    Appearance: He is well-developed.  HENT:     Head: Normocephalic and atraumatic.     Mouth/Throat:     Mouth: Mucous membranes are dry.  Eyes:     Conjunctiva/sclera: Conjunctivae normal.  Cardiovascular:     Rate and Rhythm: Tachycardia present. Rhythm irregular.     Heart sounds: Normal heart sounds. No murmur heard.  No friction rub.  Pulmonary:     Effort: Pulmonary effort is normal. No respiratory distress.     Breath sounds: Normal breath sounds. No wheezing or rales.  Abdominal:     General: Abdomen is flat. There is no distension.     Palpations: Abdomen is soft.     Tenderness: There is abdominal tenderness.     Comments: Mild, generalized  Musculoskeletal:     Cervical back: Neck supple.     Right lower leg: Edema present.     Left lower leg: Edema present.  Skin:    General: Skin is warm.     Capillary Refill: Capillary refill takes less than 2 seconds.  Neurological:     Mental Status: He is alert and oriented to person, place, and time.     Motor: No abnormal muscle tone.       ____________________________________________   LABS (all labs ordered  are listed, but only abnormal results are displayed)  Labs Reviewed  LIPASE, BLOOD - Abnormal; Notable  for the following components:      Result Value   Lipase 59 (*)    All other components within normal limits  COMPREHENSIVE METABOLIC PANEL - Abnormal; Notable for the following components:   CO2 21 (*)    Glucose, Bld 147 (*)    Creatinine, Ser 1.32 (*)    Albumin 3.2 (*)    AST 88 (*)    Total Bilirubin 1.5 (*)    GFR calc non Af Amer 54 (*)    All other components within normal limits  CBC - Abnormal; Notable for the following components:   RBC 3.34 (*)    Hemoglobin 9.7 (*)    HCT 31.4 (*)    RDW 16.6 (*)    All other components within normal limits  LACTIC ACID, PLASMA - Abnormal; Notable for the following components:   Lactic Acid, Venous 4.4 (*)    All other components within normal limits  PROTIME-INR - Abnormal; Notable for the following components:   Prothrombin Time 26.6 (*)    INR 2.6 (*)    All other components within normal limits  GASTROINTESTINAL PANEL BY PCR, STOOL (REPLACES STOOL CULTURE)  C DIFFICILE QUICK SCREEN W PCR REFLEX  SARS CORONAVIRUS 2 BY RT PCR (HOSPITAL ORDER, [facility])  CULTURE, BLOOD (ROUTINE X 2)  CULTURE, BLOOD (ROUTINE X 2)  URINALYSIS, COMPLETE (UACMP) WITH MICROSCOPIC  LACTIC ACID, PLASMA    ____________________________________________  EKG:  ________________________________________  RADIOLOGY All imaging, including plain films, CT scans, and ultrasounds, independently reviewed by me, and interpretations confirmed via formal radiology reads.  ED MD interpretation:   CT: No vascular abnormalities, cirrhosis noted, ? Decreased enhancement L kidney  Official radiology report(s): CT Angio Abd/Pel W and/or Wo Contrast  Result Date: 08/04/2019 CLINICAL DATA:  Abdominal pain and GI bleeding EXAM: CTA ABDOMEN AND PELVIS WITHOUT AND WITH CONTRAST TECHNIQUE: Multidetector CT imaging of the abdomen and  pelvis was performed using the standard protocol during bolus administration of intravenous contrast. Multiplanar reconstructed images and MIPs were obtained and reviewed to evaluate the vascular anatomy. CONTRAST:  178m OMNIPAQUE IOHEXOL 350 MG/ML SOLN COMPARISON:  None. FINDINGS: VASCULAR Aorta: Atherosclerotic calcifications are noted without aneurysmal dilatation. Celiac: Patent without evidence of aneurysm, dissection, vasculitis or significant stenosis. SMA: Patent without evidence of aneurysm, dissection, vasculitis or significant stenosis. Renals: Both renal arteries are patent without evidence of aneurysm, dissection, vasculitis, fibromuscular dysplasia or significant stenosis. IMA: Patent without evidence of aneurysm, dissection, vasculitis or significant stenosis. Iliacs: Atherosclerotic calcifications are noted without focal stenosis. Veins: Changes consistent with portal hypertension are noted with a spontaneous portacaval shunt extending from the superior mesenteric vein to the IVC. There also changes consistent with recanalization of the umbilical vein and multiple collaterals and varices in the pelvis related to these collaterals. These changes are stable from multiple previous exams. Review of the MIP images confirms the above findings. NON-VASCULAR Lower chest: No focal infiltrate or sizable effusion is seen. Hepatobiliary: Diffuse fatty infiltration of the liver is noted. Nodularity of the liver consistent with underlying cirrhosis is noted. The gallbladder is decompressed. Mild ascites surrounding the liver is noted. Pancreas: Unremarkable. No pancreatic ductal dilatation or surrounding inflammatory changes. Spleen: Normal in size without focal abnormality. Adrenals/Urinary Tract: Adrenal glands are within normal limits. Kidneys demonstrate a normal enhancement pattern bilaterally with the exception of the posterior aspect of the left kidney. Bladder is decompressed. Stomach/Bowel: The appendix  has been surgically removed. The colon is decompressed. No  obstructive or inflammatory changes are seen. The stomach and small bowel appear within normal limits. No areas of active extravasation are identified. No definitive pooling of contrast is seen to suggest acute GI hemorrhage. Lymphatic: No significant lymphadenopathy is identified. Reproductive: Prostate is unremarkable. Other: Mild ascites is noted.  No hernia is seen. Musculoskeletal: Degenerative changes of lumbar spine are noted. IMPRESSION: VASCULAR No pooling of contrast material to suggest active GI hemorrhage. 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 The venous changes are stable from previous exams. NON-VASCULAR Hepatic cirrhosis and mild fatty infiltration. Decreased enhancement in the posterior aspect of the left kidney new from a prior exam of 2016. This may be related to embolic phenomena although the possibility of pyelonephritis would deserve consideration as well. Correlation with laboratory values is recommended. No other focal abnormality is noted. Electronically Signed   By: Inez Catalina M.D.   On: 08/04/2019 01:15    ____________________________________________  PROCEDURES   Procedure(s) performed (including Critical Care):  .1-3 Lead EKG Interpretation Performed by: Duffy Bruce, MD Authorized by: Duffy Bruce, MD     Interpretation: non-specific     ECG rate:  90-120   ECG rate assessment: tachycardic     Rhythm: atrial fibrillation     Ectopy: none     Conduction: normal   Comments:     Indication: Weakness, diarrhea    ____________________________________________  INITIAL IMPRESSION / MDM / ASSESSMENT AND PLAN / ED COURSE  As part of my medical decision making, I reviewed the following data within the [facility] notes reviewed and incorporated, Old chart reviewed, Notes  from prior ED visits, and [place] Controlled Substance Database       *Christian Berg was evaluated in Emergency Department on 08/04/2019 for the symptoms described in the history of present illness. He was evaluated in the context of the global COVID-19 pandemic, which necessitated consideration that the patient might be at risk for infection with the SARS-CoV-2 virus that causes COVID-19. Institutional protocols and algorithms that pertain to the evaluation of patients at risk for COVID-19 are in a state of rapid change based on information released by regulatory bodies including the CDC and federal and state organizations. These policies and algorithms were followed during the patient's care in the ED.  Some ED evaluations and interventions may be delayed as a result of limited staffing during the pandemic.*     Medical Decision Making:  69 yo M here with weakness, diarrhea. Pt appears dehydrated, clinically uncomfortable on exam. DDx includes viral GI illness, food-borne illness, possibly diarrhea from opiate w/d after missing his Ultram for a week. His report of dark stool raises question of GIB given his coumadin use, though Hgb is stable here. Given his profuse diarrhea and pain, CT obtained and is negative for surgical abnormality or signs of ischemia. ? Renal infarct will need to be followed up. His coumadin is therapeutic. Admit for weakness, ongoing diarrhea.  ____________________________________________  FINAL CLINICAL IMPRESSION(S) / ED DIAGNOSES  Final diagnoses:  Diarrhea of presumed infectious origin  Tachycardia     MEDICATIONS GIVEN DURING THIS VISIT:  Medications  sodium chloride flush (NS) 0.9 % injection 3 mL (has no administration in time range)  0.9 %  sodium chloride infusion (has no administration in time range)  ondansetron (ZOFRAN) injection 4 mg (0 mg Intravenous Hold 08/04/19 0018)  morphine 4 MG/ML injection (has no administration  in time range)  traMADol (ULTRAM)  tablet 50 mg (50 mg Oral Given 08/03/19 2354)  iohexol (OMNIPAQUE) 350 MG/ML injection 125 mL (125 mLs Intravenous Contrast Given 08/04/19 0032)     ED Discharge Orders    None       Note:  This document was prepared using Dragon voice recognition software and may include unintentional dictation errors.   Duffy Bruce, MD 08/04/19 [place]    Duffy Bruce, MD 08/04/19 [phone]

## 2019-08-04 ENCOUNTER — Emergency Department: Payer: Medicare HMO

## 2019-08-04 ENCOUNTER — Encounter: Payer: Self-pay | Admitting: Radiology

## 2019-08-04 ENCOUNTER — Other Ambulatory Visit: Payer: Self-pay

## 2019-08-04 DIAGNOSIS — R197 Diarrhea, unspecified: Secondary | ICD-10-CM

## 2019-08-04 DIAGNOSIS — E1151 Type 2 diabetes mellitus with diabetic peripheral angiopathy without gangrene: Secondary | ICD-10-CM | POA: Diagnosis present

## 2019-08-04 DIAGNOSIS — Z7901 Long term (current) use of anticoagulants: Secondary | ICD-10-CM

## 2019-08-04 DIAGNOSIS — I4891 Unspecified atrial fibrillation: Secondary | ICD-10-CM | POA: Diagnosis present

## 2019-08-04 DIAGNOSIS — M5136 Other intervertebral disc degeneration, lumbar region: Secondary | ICD-10-CM | POA: Diagnosis present

## 2019-08-04 DIAGNOSIS — K7460 Unspecified cirrhosis of liver: Secondary | ICD-10-CM | POA: Diagnosis not present

## 2019-08-04 DIAGNOSIS — M545 Low back pain: Secondary | ICD-10-CM | POA: Diagnosis not present

## 2019-08-04 DIAGNOSIS — I10 Essential (primary) hypertension: Secondary | ICD-10-CM

## 2019-08-04 DIAGNOSIS — E1121 Type 2 diabetes mellitus with diabetic nephropathy: Secondary | ICD-10-CM | POA: Diagnosis not present

## 2019-08-04 DIAGNOSIS — I482 Chronic atrial fibrillation, unspecified: Secondary | ICD-10-CM

## 2019-08-04 DIAGNOSIS — I5032 Chronic diastolic (congestive) heart failure: Secondary | ICD-10-CM

## 2019-08-04 DIAGNOSIS — K6389 Other specified diseases of intestine: Secondary | ICD-10-CM | POA: Diagnosis not present

## 2019-08-04 DIAGNOSIS — I700 Atherosclerosis of aorta: Secondary | ICD-10-CM | POA: Diagnosis not present

## 2019-08-04 DIAGNOSIS — Z20822 Contact with and (suspected) exposure to covid-19: Secondary | ICD-10-CM | POA: Diagnosis present

## 2019-08-04 DIAGNOSIS — N1831 Chronic kidney disease, stage 3a: Secondary | ICD-10-CM | POA: Diagnosis not present

## 2019-08-04 DIAGNOSIS — E860 Dehydration: Secondary | ICD-10-CM | POA: Diagnosis present

## 2019-08-04 DIAGNOSIS — J45909 Unspecified asthma, uncomplicated: Secondary | ICD-10-CM | POA: Diagnosis present

## 2019-08-04 DIAGNOSIS — R000 Tachycardia, unspecified: Secondary | ICD-10-CM | POA: Diagnosis not present

## 2019-08-04 DIAGNOSIS — F1123 Opioid dependence with withdrawal: Secondary | ICD-10-CM | POA: Diagnosis present

## 2019-08-04 DIAGNOSIS — N183 Chronic kidney disease, stage 3 unspecified: Secondary | ICD-10-CM | POA: Diagnosis not present

## 2019-08-04 DIAGNOSIS — E1122 Type 2 diabetes mellitus with diabetic chronic kidney disease: Secondary | ICD-10-CM | POA: Diagnosis not present

## 2019-08-04 DIAGNOSIS — I5033 Acute on chronic diastolic (congestive) heart failure: Secondary | ICD-10-CM | POA: Diagnosis not present

## 2019-08-04 DIAGNOSIS — D6859 Other primary thrombophilia: Secondary | ICD-10-CM | POA: Diagnosis present

## 2019-08-04 DIAGNOSIS — G8929 Other chronic pain: Secondary | ICD-10-CM | POA: Diagnosis present

## 2019-08-04 DIAGNOSIS — Z950 Presence of cardiac pacemaker: Secondary | ICD-10-CM | POA: Diagnosis not present

## 2019-08-04 DIAGNOSIS — E785 Hyperlipidemia, unspecified: Secondary | ICD-10-CM | POA: Diagnosis present

## 2019-08-04 DIAGNOSIS — D509 Iron deficiency anemia, unspecified: Secondary | ICD-10-CM | POA: Diagnosis present

## 2019-08-04 DIAGNOSIS — R69 Illness, unspecified: Secondary | ICD-10-CM | POA: Diagnosis not present

## 2019-08-04 DIAGNOSIS — D6869 Other thrombophilia: Secondary | ICD-10-CM | POA: Diagnosis not present

## 2019-08-04 DIAGNOSIS — Z8673 Personal history of transient ischemic attack (TIA), and cerebral infarction without residual deficits: Secondary | ICD-10-CM | POA: Diagnosis not present

## 2019-08-04 DIAGNOSIS — K828 Other specified diseases of gallbladder: Secondary | ICD-10-CM | POA: Diagnosis not present

## 2019-08-04 DIAGNOSIS — K7581 Nonalcoholic steatohepatitis (NASH): Secondary | ICD-10-CM | POA: Diagnosis present

## 2019-08-04 DIAGNOSIS — I130 Hypertensive heart and chronic kidney disease with heart failure and stage 1 through stage 4 chronic kidney disease, or unspecified chronic kidney disease: Secondary | ICD-10-CM | POA: Diagnosis present

## 2019-08-04 DIAGNOSIS — E872 Acidosis: Secondary | ICD-10-CM | POA: Diagnosis present

## 2019-08-04 DIAGNOSIS — Z6841 Body Mass Index (BMI) 40.0 and over, adult: Secondary | ICD-10-CM | POA: Diagnosis not present

## 2019-08-04 DIAGNOSIS — D500 Iron deficiency anemia secondary to blood loss (chronic): Secondary | ICD-10-CM | POA: Diagnosis not present

## 2019-08-04 DIAGNOSIS — E538 Deficiency of other specified B group vitamins: Secondary | ICD-10-CM | POA: Diagnosis present

## 2019-08-04 LAB — GASTROINTESTINAL PANEL BY PCR, STOOL (REPLACES STOOL CULTURE)

## 2019-08-04 LAB — SARS CORONAVIRUS 2 BY RT PCR (HOSPITAL ORDER, PERFORMED IN ~~LOC~~ HOSPITAL LAB): SARS Coronavirus 2: NEGATIVE

## 2019-08-04 LAB — GLUCOSE, CAPILLARY
Glucose-Capillary: 119 mg/dL — ABNORMAL HIGH (ref 70–99)
Glucose-Capillary: 153 mg/dL — ABNORMAL HIGH (ref 70–99)
Glucose-Capillary: 146 mg/dL — ABNORMAL HIGH (ref 70–99)
Glucose-Capillary: 182 mg/dL — ABNORMAL HIGH (ref 70–99)

## 2019-08-04 LAB — CBC
HCT: 27.7 % — ABNORMAL LOW (ref 39.0–52.0)
Hemoglobin: 8.7 g/dL — ABNORMAL LOW (ref 13.0–17.0)
MCH: 29.1 pg (ref 26.0–34.0)
MCHC: 31.4 g/dL (ref 30.0–36.0)
MCV: 92.6 fL (ref 80.0–100.0)
Platelets: 201 10*3/uL (ref 150–400)
RBC: 2.99 MIL/uL — ABNORMAL LOW (ref 4.22–5.81)
RDW: 17 % — ABNORMAL HIGH (ref 11.5–15.5)
WBC: 4.9 10*3/uL (ref 4.0–10.5)
nRBC: 0 % (ref 0.0–0.2)

## 2019-08-04 LAB — BASIC METABOLIC PANEL
Anion gap: 7 (ref 5–15)
BUN: 14 mg/dL (ref 8–23)
CO2: 26 mmol/L (ref 22–32)
Calcium: 8.7 mg/dL — ABNORMAL LOW (ref 8.9–10.3)
Chloride: 105 mmol/L (ref 98–111)
Creatinine, Ser: 1.28 mg/dL — ABNORMAL HIGH (ref 0.61–1.24)
GFR calc Af Amer: >60 mL/min (ref >60)
GFR calc non Af Amer: 56 mL/min — ABNORMAL LOW (ref >60)
Glucose, Bld: 175 mg/dL — ABNORMAL HIGH (ref 70–99)
Potassium: 3.6 mmol/L (ref 3.5–5.1)
Sodium: 138 mmol/L (ref 135–145)

## 2019-08-04 LAB — URINALYSIS, COMPLETE (UACMP) WITH MICROSCOPIC
Bacteria, UA: NONE SEEN
Bilirubin Urine: NEGATIVE
Glucose, UA: >=500 mg/dL — AB
Ketones, ur: 5 mg/dL — AB
Leukocytes,Ua: NEGATIVE
Nitrite: NEGATIVE
Protein, ur: NEGATIVE mg/dL
Specific Gravity, Urine: 1.037 — ABNORMAL HIGH (ref 1.005–1.030)
pH: 5 (ref 5.0–8.0)

## 2019-08-04 LAB — IRON AND TIBC
Iron: 34 ug/dL — ABNORMAL LOW (ref 45–182)
Saturation Ratios: 8 % — ABNORMAL LOW (ref 17.9–39.5)
TIBC: 424 ug/dL (ref 250–450)
UIBC: 390 ug/dL

## 2019-08-04 LAB — HEMOGLOBIN A1C
Hgb A1c MFr Bld: 6.3 % — ABNORMAL HIGH (ref 4.8–5.6)
Mean Plasma Glucose: 134.11 mg/dL

## 2019-08-04 LAB — PROTIME-INR
INR: 2.6 — ABNORMAL HIGH (ref 0.8–1.2)
INR: 2.8 — ABNORMAL HIGH (ref 0.8–1.2)
Prothrombin Time: 26.6 seconds — ABNORMAL HIGH (ref 11.4–15.2)
Prothrombin Time: 28.3 seconds — ABNORMAL HIGH (ref 11.4–15.2)

## 2019-08-04 LAB — FOLATE: Folate: 9 ng/mL (ref >5.9)

## 2019-08-04 LAB — BRAIN NATRIURETIC PEPTIDE: B Natriuretic Peptide: 368.1 pg/mL — ABNORMAL HIGH (ref 0.0–100.0)

## 2019-08-04 LAB — C DIFFICILE QUICK SCREEN W PCR REFLEX
C Diff antigen: NEGATIVE
C Diff interpretation: NOT DETECTED
C Diff toxin: NEGATIVE

## 2019-08-04 LAB — TSH: TSH: 2.429 u[IU]/mL (ref 0.350–4.500)

## 2019-08-04 LAB — HIV ANTIBODY (ROUTINE TESTING W REFLEX): HIV Screen 4th Generation wRfx: NONREACTIVE

## 2019-08-04 LAB — LACTIC ACID, PLASMA
Lactic Acid, Venous: 3.1 mmol/L (ref 0.5–1.9)
Lactic Acid, Venous: 4.4 mmol/L (ref 0.5–1.9)

## 2019-08-04 LAB — VITAMIN B12: Vitamin B-12: 278 pg/mL (ref 180–914)

## 2019-08-04 LAB — FERRITIN: Ferritin: 24 ng/mL (ref 24–336)

## 2019-08-04 MED ORDER — IOHEXOL 350 MG/ML SOLN
125.0000 mL | Freq: Once | INTRAVENOUS | Status: AC | PRN
  Administered 2019-08-04: 125 mL via INTRAVENOUS

## 2019-08-04 MED ORDER — PIPERACILLIN-TAZOBACTAM 3.375 G IVPB 30 MIN
3.3750 g | Freq: Once | INTRAVENOUS | Status: AC
  Administered 2019-08-04: 3.375 g via INTRAVENOUS
  Filled 2019-08-04: qty 50

## 2019-08-04 MED ORDER — TRAMADOL HCL 50 MG PO TABS: 100.0000 mg | ORAL_TABLET | Freq: Two times a day (BID) | ORAL | Status: DC

## 2019-08-04 MED ORDER — ACETAMINOPHEN 650 MG RE SUPP: 650.0000 mg | Freq: Four times a day (QID) | RECTAL | Status: DC | PRN

## 2019-08-04 MED ORDER — INSULIN ASPART 100 UNIT/ML ~~LOC~~ SOLN: 0.0000 [IU] | Freq: Every day | SUBCUTANEOUS | Status: DC

## 2019-08-04 MED ORDER — ACETAMINOPHEN 325 MG PO TABS: 650.0000 mg | ORAL_TABLET | Freq: Four times a day (QID) | ORAL | Status: DC | PRN

## 2019-08-04 MED ORDER — MORPHINE SULFATE (PF) 4 MG/ML IV SOLN: 4.0000 mg | Freq: Once | INTRAVENOUS | Status: AC

## 2019-08-04 MED ORDER — METOPROLOL TARTRATE 50 MG PO TABS
100.0000 mg | ORAL_TABLET | Freq: Two times a day (BID) | ORAL | Status: DC
  Administered 2019-08-04 – 2019-08-06 (×4): 100 mg via ORAL
  Filled 2019-08-04 (×5): qty 2

## 2019-08-04 MED ORDER — ASPIRIN EC 81 MG PO TBEC
81.0000 mg | DELAYED_RELEASE_TABLET | Freq: Every evening | ORAL | Status: DC
  Administered 2019-08-04 – 2019-08-05 (×2): 81 mg via ORAL
  Filled 2019-08-04 (×2): qty 1

## 2019-08-04 MED ORDER — INSULIN ASPART 100 UNIT/ML ~~LOC~~ SOLN
0.0000 [IU] | Freq: Three times a day (TID) | SUBCUTANEOUS | Status: DC
  Administered 2019-08-04 – 2019-08-05 (×3): 3 [IU] via SUBCUTANEOUS
  Administered 2019-08-05: 2 [IU] via SUBCUTANEOUS
  Administered 2019-08-06: 3 [IU] via SUBCUTANEOUS
  Filled 2019-08-04 (×4): qty 1

## 2019-08-04 MED ORDER — WARFARIN SODIUM 6 MG PO TABS
6.0000 mg | ORAL_TABLET | Freq: Once | ORAL | Status: AC
  Administered 2019-08-04: 6 mg via ORAL
  Filled 2019-08-04: qty 1

## 2019-08-04 MED ORDER — TRAMADOL HCL 50 MG PO TABS
50.0000 mg | ORAL_TABLET | Freq: Two times a day (BID) | ORAL | Status: DC
  Administered 2019-08-04 – 2019-08-06 (×4): 50 mg via ORAL
  Filled 2019-08-04 (×5): qty 1

## 2019-08-04 MED ORDER — SODIUM CHLORIDE 0.9 % IV SOLN: INTRAVENOUS | Status: DC

## 2019-08-04 MED ORDER — FUROSEMIDE 10 MG/ML IJ SOLN
40.0000 mg | Freq: Once | INTRAMUSCULAR | Status: AC
  Administered 2019-08-04: 40 mg via INTRAVENOUS
  Filled 2019-08-04: qty 4

## 2019-08-04 MED ORDER — MORPHINE SULFATE (PF) 2 MG/ML IV SOLN: 2.0000 mg | INTRAVENOUS | Status: DC | PRN

## 2019-08-04 MED ORDER — HYDROCODONE-ACETAMINOPHEN 5-325 MG PO TABS
1.0000 | ORAL_TABLET | ORAL | Status: DC | PRN
  Administered 2019-08-04 – 2019-08-05 (×2): 2 via ORAL
  Filled 2019-08-04 (×2): qty 2

## 2019-08-04 MED ORDER — DILTIAZEM HCL-DEXTROSE 125-5 MG/125ML-% IV SOLN (PREMIX)
5.0000 mg/h | INTRAVENOUS | Status: DC
  Administered 2019-08-04: 5 mg/h via INTRAVENOUS
  Filled 2019-08-04: qty 125

## 2019-08-04 MED ORDER — WARFARIN - PHARMACIST DOSING INPATIENT
Freq: Every day | Status: DC
  Filled 2019-08-04: qty 1

## 2019-08-04 MED ORDER — FUROSEMIDE 10 MG/ML IJ SOLN: 40.0000 mg | Freq: Two times a day (BID) | INTRAMUSCULAR | Status: DC

## 2019-08-04 MED ORDER — DILTIAZEM LOAD VIA INFUSION
10.0000 mg | Freq: Once | INTRAVENOUS | Status: AC
  Administered 2019-08-04: 10 mg via INTRAVENOUS
  Filled 2019-08-04: qty 10

## 2019-08-04 MED ORDER — MORPHINE SULFATE (PF) 4 MG/ML IV SOLN
INTRAVENOUS | Status: AC
  Administered 2019-08-04: 4 mg via INTRAVENOUS
  Filled 2019-08-04: qty 1

## 2019-08-04 MED ORDER — ONDANSETRON HCL 4 MG/2ML IJ SOLN: 4.0000 mg | Freq: Four times a day (QID) | INTRAMUSCULAR | Status: DC | PRN

## 2019-08-04 MED ORDER — ONDANSETRON HCL 4 MG PO TABS: 4.0000 mg | ORAL_TABLET | Freq: Four times a day (QID) | ORAL | Status: DC | PRN

## 2019-08-04 NOTE — Consult Note (Signed)
ANTICOAGULATION CONSULT NOTE - Initial Consult  Pharmacy Consult for Warfarin Dosing and Monitoring  Indication: atrial fibrillation  Allergies  Allergen Reactions  . Clindamycin/Lincomycin     Chest discomfort  . Sulfa Antibiotics Hives  . Sulfasalazine Hives    Patient Measurements: Height: 5' 8"  (172.7 cm) Weight: 135.6 kg (299 lb) IBW/kg (Calculated) : 68.4  Vital Signs: BP: 137/77 (07/08 1052) Pulse Rate: 119 (07/08 1052)  Labs: Recent Labs    08/03/19 1720 08/03/19 2355  HGB 9.7*  --   HCT 31.4*  --   PLT 242  --   LABPROT  --  26.6*  INR  --  2.6*  CREATININE 1.32*  --     Estimated Creatinine Clearance: 70.2 mL/min (A) (by C-G formula based on SCr of 1.32 mg/dL (H)).   Medical History: Past Medical History:  Diagnosis Date  . A-fib ([place])   . Carotid stenosis   . CHF (congestive heart failure) ([place])   . Degenerative disc disease, lumbar    s/p injury  . Diabetes mellitus without complication ([place])   . Disseminated superficial actinic porokeratosis   . Dysrhythmia    A-FIB, palpatations  . 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 Fatty liver   . History of degenerative disc disease   . History of kidney stones   . Hypercholesteremia   . Hypertension   . Kidney stones   . Obesity   . Pacemaker    inactive  . Peripheral vascular disease ([place])    Carotid stenosis  . Presence of permanent cardiac pacemaker    Inactive  . TIA (transient ischemic attack) 2011   No deficits  . TIA (transient ischemic attack)   . Vitamin B12 deficiency     Assessment: Pharmacy consulted for warfarin dosing and monitoring for 69 yo male with PMH of A. Fib. INR therapeutic on admission @ 2.6.  Home Regimen: Warfarin 40m MWF                               Warfarin 7.575mT,TH, Sat, Sun   DATE INR DOSE 7/7 2.6 - 7/8 -  Goal of Therapy:  INR 2-3 Monitor platelets by anticoagulation protocol: Yes   Plan:  Patient received antibiotics x 1 dose in ED.  Will order reduced  warfarin dose of  51m25m 1 tonight.  INR order with AM labs.  Pharmacy will continue to follow and order warfarin dose based on INR levels.   ShePernell DupreharmD, BCPS Clinical Pharmacist 08/04/2019 11:15 AM

## 2019-08-04 NOTE — Progress Notes (Signed)
Patient admitted to the hospital earlier this morning by Dr. Damita Dunnings  Patient seen and examined.  He is overall feeling better.  Diarrhea is better.  No abdominal pain.  He does describe having orthopnea.  He has had worsening lower extremity edema as well as edema in his upper extremities.  He is noted to be in rapid atrial fibrillation with a heart rate of 130s to 140s.[name]  He has 1-2+ pitting edema in the lower extremities.  Lungs are clear to auscultation bilaterally.  Assessment/plan:  Intractable diarrhea.  GI pathogen panel and C. difficile found to be negative.  He was afebrile at home.  Possible etiologies include opiate withdrawal since he ran out of tramadol 1 week ago.  Currently on clear liquid diet.  Continue supportive care.  Overall symptoms appear to be improving  Acute on chronic diastolic congestive heart failure.  Patient does have evidence of volume overload.  Continue on beta-blockers.  We will give a dose of IV Lasix.  Check BNP.  Check echocardiogram.  If patient has good response to IV Lasix, can continue the same.  Lactic acidosis.  Felt to be related to volume depletion from persistent diarrhea.  He received 3 L of IV fluids in the emergency room.  Lactic acid still mildly elevated, but clinically the patient does not appear to be dehydrated or volume depleted.  Blood pressure stable.  Possibly related to Metformin use.  Continue to monitor.  Hold Metformin  Diabetes.  Chronically on Metformin, Jardiance and glipizide.  These are currently on hold.  He is on sliding scale insulin.  Atrial fibrillation with rapid ventricular response.  Patient is currently on metoprolol.  He is anticoagulated with Coumadin.  Continue on metoprolol dose.  Will start on Cardizem infusion to help control heart rate.  Chronic kidney disease stage III.  Creatinine is currently at baseline.  Continue to monitor.  Hypertension.  On chronic metoprolol and lisinopril.  Blood pressures currently  stable.  Continue to monitor.  Anemia.  Hemoglobin is currently near baseline.  Continue to monitor.  No signs of bleeding.  Critical care procedure note Authorized and performed by: Kathie Dike Total critical care time: Approximately 30 minutes Due to high probability of clinically significant, life-threatening deterioration, the patient required my highest level of preparedness to intervene emergently and I personally spent this critical care time directly and personally managing the patient.  The critical care time included obtaining a history, examining the patient, pulse oximetry, ordering and review of studies, arranging urgent treatment with development of a management plan, evaluation of patient's response to treatment, frequent reassessment, discussions with other providers.  Critical care time was performed to assess and manage the high probability of imminent, life-threatening deterioration that could result in multiorgan failure.  It was exclusive of separate billable procedures and treating other patients and teaching time.  Please see MDM section and the rest of the of note for further information on patient assessment and treatment   [employer]

## 2019-08-04 NOTE — ED Notes (Signed)
This RN at bedside. Pt BP 85/60. BP cuff adjusted and BP 105/76. Diltiazem drip decreased to 7.50m/hr. HR between 66-75.

## 2019-08-04 NOTE — Progress Notes (Signed)
Patient has cardizem infusing at 7.5 mg/hr  and metoprolol 100 mg scheduled to be admimistered. BP now is 100/53. Notified E. Ouma NP and she indicated to hold the Metoprolol and stop the Cardizem drip. Will continue to monitor.

## 2019-08-04 NOTE — Progress Notes (Signed)
PHARMACY -  BRIEF ANTIBIOTIC NOTE   Pharmacy has received consult(s) for Zosyn from an ED provider.  The patient's profile has been reviewed for ht/wt/allergies/indication/available labs.    One time order(s) placed for Zosyn 3.375gm  Further antibiotics/pharmacy consults should be ordered by admitting physician if indicated.                       Thank you, Hart Robinsons A 08/04/2019  2:52 AM

## 2019-08-04 NOTE — H&P (Signed)
History and Physical    Christian Berg VXY:[id number] DOB: 30-May-1950 DOA: 08/03/2019  PCP: Birdie Sons, MD   Patient coming from: home  I have personally briefly reviewed patient's old medical records in [place]  Chief Complaint: Diarrhea, weakness x5 days  HPI: Christian Berg is a 69 y.o. male with medical history significant for A. fib on Coumadin, hypertension, diabetes, cirrhosis secondary to Christian Berg, asthma who presents to the emergency room with a 5-day history of diarrhea associated with abdominal cramping and worsening generalized weakness.  Patient is having at least 5 bowel movements a day, copious amounts, watery, dark but not black and without blood.  His abdominal pain is colicky and generalized, of mild to moderate intensity unassociated with bowel movements.  No alleviating factors.  Denies recent travel or suspicious food intake and has no affected contacts.  Reports that he ran out of his Ultram about a week prior to the onset of the symptoms.  He has had no vomiting fever or chills.  Denies cough or chest pain.  States that he has difficulty getting about due to weakness related to his diarrhea. ED Course: On arrival he was tachycardic at 102 with otherwise normal vitals.  Blood work significant for normal white cell count of 7000 but with lactic acid 4.4.  Hemoglobin 9.7, with most recent baseline 2 months ago of 10.4.  Lipase 59, creatinine 1.32 which is baseline, and mildly elevated total bilirubin of 1.5.  CT abdomen and pelvis showed no pooling of contrast material to suggest active GI hemorrhage.  Showed findings associated with known hepatic cirrhosis.  GI panel, stool for C. difficile and C. difficile toxin all negative.  Hospitalist consulted for admission.  Review of Systems: As per HPI otherwise all other systems on review of systems negative.    Past Medical History:  Diagnosis Date  . A-fib ([place])   . Carotid stenosis   . CHF (congestive heart  failure) ([place])   . Degenerative disc disease, lumbar    s/p injury  . Diabetes mellitus without complication ([place])   . Disseminated superficial actinic porokeratosis   . Dysrhythmia    A-FIB, palpatations  . 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 Fatty liver   . History of degenerative disc disease   . History of kidney stones   . Hypercholesteremia   . Hypertension   . Kidney stones   . Obesity   . Pacemaker    inactive  . Peripheral vascular disease ([place])    Carotid stenosis  . Presence of permanent cardiac pacemaker    Inactive  . TIA (transient ischemic attack) 2011   No deficits  . TIA (transient ischemic attack)   . Vitamin B12 deficiency     Past Surgical History:  Procedure Laterality Date  . APPENDECTOMY  2004  . CARDIAC CATHETERIZATION    . CARDIAC PACEMAKER PLACEMENT  2005  . CATARACT EXTRACTION W/PHACO Right 06/14/2014   Procedure: CATARACT EXTRACTION PHACO AND INTRAOCULAR LENS PLACEMENT (IOC);  Surgeon: Leandrew Koyanagi, MD;  Location: [facility];  Service: Ophthalmology;  Laterality: Right;  . CATARACT EXTRACTION W/PHACO Left 10/27/2018   Procedure: CATARACT EXTRACTION PHACO AND INTRAOCULAR LENS PLACEMENT (IOC) LEFT DIABETIC  00:46.4  18.6%  8.63;  Surgeon: Leandrew Koyanagi, MD;  Location: [facility];  Service: Ophthalmology;  Laterality: Left;  Diabetic - oral meds  . CYSTOSCOPY W/ RETROGRADES Bilateral 08/09/2014   Procedure: CYSTOSCOPY WITH RETROGRADE PYELOGRAM;  Surgeon: Hollice Espy, MD;  Location: [facility];  Service: Urology;  Laterality: Bilateral;  . INTUBATION-ENDOTRACHEAL WITH TRACHEOSTOMY STANDBY  03/30/2017   Procedure: INTUBATION-ENDOTRACHEAL WITH TRACHEOSTOMY STANDBY;  Surgeon: Carloyn Manner, MD;  Location: [facility];  Service: ENT;;     reports that he has never smoked. He has never used smokeless tobacco. He reports that he does not drink alcohol and does not use drugs.  Allergies  Allergen Reactions  . Clindamycin/Lincomycin      Chest discomfort  . Sulfa Antibiotics Hives  . Sulfasalazine Hives    Family History  Adopted: Yes  Problem Relation Age of Onset  . Heart disease Mother   . Fibromyalgia Sister   . Hypertension Daughter   . Migraines Daughter   . Kidney disease Neg Hx   . Prostate cancer Neg Hx   . Bladder Cancer Neg Hx       Prior to Admission medications   Medication Sig Start Date End Date Taking? Authorizing Provider  albuterol (VENTOLIN HFA) 108 (90 Base) MCG/ACT inhaler Inhale 2 puffs into the lungs every 6 (six) hours as needed for wheezing or shortness of breath. 05/31/19   Birdie Sons, MD  aspirin EC 81 MG tablet Take 81 mg by mouth every evening.     [provider]  brimonidine (ALPHAGAN) 0.2 % ophthalmic solution brimonidine 0.2 % eye drops  INSTILL 1 DROP INTO EACH EYE TWICE DAILY    [provider]  clotrimazole (CLOTRIMAZOLE AF) 1 % cream Apply 1 application topically 2 (two) times daily. 03/25/19   Trinna Post, PA-C  empagliflozin (JARDIANCE) 10 MG TABS tablet Take 10 mg by mouth daily before breakfast. 06/06/19   Birdie Sons, MD  ferrous sulfate 325 (65 FE) MG tablet Take 325 mg by mouth daily with breakfast.    [provider]  furosemide (LASIX) 20 MG tablet Take 1 tablet by mouth once daily 06/18/19   Birdie Sons, MD  glipiZIDE (GLUCOTROL) 10 MG tablet TAKE 1 TABLET BY MOUTH TWICE DAILY BEFORE MEAL(S) 05/15/19   Birdie Sons, MD  ketoconazole (NIZORAL) 2 % cream Apply 1 application topically daily. 06/07/19   Birdie Sons, MD  lidocaine (LIDODERM) 5 % Place 1 patch onto the skin every 12 (twelve) hours. Remove & Discard patch within 12 hours or as directed by MD 12/03/18 12/03/19  Sable Feil, PA-C  lisinopril (ZESTRIL) 20 MG tablet Take 1 tablet (20 mg total) by mouth daily. 04/07/19   Birdie Sons, MD  lovastatin (MEVACOR) 20 MG tablet Take 1 tablet (20 mg total) by mouth every evening. 04/08/19   Birdie Sons, MD   metFORMIN (GLUCOPHAGE) 1000 MG tablet TAKE 1 TABLET BY MOUTH TWICE DAILY WITH MEALS 11/16/18   Birdie Sons, MD  metoprolol tartrate (LOPRESSOR) 100 MG tablet Take 1 tablet (100 mg total) by mouth at bedtime. Patient taking differently: Take 200 mg by mouth 2 (two) times daily.  10/24/16   Demetrios Loll, MD  nystatin cream (MYCOSTATIN) Apply 1 application topically 2 (two) times daily. 07/17/14   Zara Council A, PA-C  traMADol (ULTRAM-ER) 100 MG 24 hr tablet Take 100 mg by mouth daily.  12/13/18   [provider]  warfarin (COUMADIN) 5 MG tablet Take as directed by your provider 03/09/19   Birdie Sons, MD  warfarin (COUMADIN) 7.5 MG tablet Take 1 tablet by mouth once daily 04/07/19   Birdie Sons, MD    Physical Exam: Vitals:   08/03/19 1701 08/03/19 1703 08/03/19 2230  BP: 137/75  (!) 151/79  Pulse: (!) 102  (!) 110  Resp: 17  20  Temp: 98.4 F (36.9 C)    TempSrc: Oral    SpO2: 96%  99%  Weight:  135.6 kg   Height:  5' 8"  (1.727 m)      Vitals:   08/03/19 1701 08/03/19 1703 08/03/19 2230  BP: 137/75  (!) 151/79  Pulse: (!) 102  (!) 110  Resp: 17  20  Temp: 98.4 F (36.9 C)    TempSrc: Oral    SpO2: 96%  99%  Weight:  135.6 kg   Height:  5' 8"  (1.727 m)       Constitutional: Alert and oriented x 3 . Not in any apparent distress HEENT:      Head: Normocephalic and atraumatic.         Eyes: PERLA, EOMI, Conjunctivae are normal. Sclera is non-icteric.       Mouth/Throat: Mucous membranes are moist.       Neck: Supple with no signs of meningismus. Cardiovascular: Regular rate and rhythm. No murmurs, gallops, or rubs. 2+ symmetrical distal pulses are present . No JVD. No 2+ LE edema Respiratory: Respiratory effort normal .Lungs sounds clear bilaterally. No wheezes, crackles, or rhonchi.  Gastrointestinal: Soft, non tender, and non distended with positive bowel sounds. No rebound or guarding. Genitourinary: No CVA tenderness. Musculoskeletal: Nontender  with normal range of motion in all extremities. No cyanosis, or erythema of extremities. Neurologic: Normal speech and language. Face is symmetric. Moving all extremities. No gross focal neurologic deficits . Skin: Skin is warm, dry.  No rash or ulcers Psychiatric: Mood and affect are normal Speech and behavior are normal   Labs on Admission: I have personally reviewed following labs and imaging studies  CBC: Recent Labs  Lab 08/03/19 1720  WBC 7.0  HGB 9.7*  HCT 31.4*  MCV 94.0  PLT 891   Basic Metabolic Panel: Recent Labs  Lab 08/03/19 1720  NA 138  K 4.0  CL 105  CO2 21*  GLUCOSE 147*  BUN 16  CREATININE 1.32*  CALCIUM 9.0   GFR: Estimated Creatinine Clearance: 70.2 mL/min (A) (by C-G formula based on SCr of 1.32 mg/dL (H)). Liver Function Tests: Recent Labs  Lab 08/03/19 1720  AST 88*  ALT 27  ALKPHOS 68  BILITOT 1.5*  PROT 7.5  ALBUMIN 3.2*   Recent Labs  Lab 08/03/19 1720  LIPASE 59*   No results for input(s): AMMONIA in the last 168 hours. Coagulation Profile: Recent Labs  Lab 08/03/19 2355  INR 2.6*   Cardiac Enzymes: No results for input(s): CKTOTAL, CKMB, CKMBINDEX, TROPONINI in the last 168 hours. BNP (last 3 results) No results for input(s): PROBNP in the last 8760 hours. HbA1C: No results for input(s): HGBA1C in the last 72 hours. CBG: No results for input(s): GLUCAP in the last 168 hours. Lipid Profile: No results for input(s): CHOL, HDL, LDLCALC, TRIG, CHOLHDL, LDLDIRECT in the last 72 hours. Thyroid Function Tests: No results for input(s): TSH, T4TOTAL, FREET4, T3FREE, THYROIDAB in the last 72 hours. Anemia Panel: No results for input(s): VITAMINB12, FOLATE, FERRITIN, TIBC, IRON, RETICCTPCT in the last 72 hours. Urine analysis:    Component Value Date/Time   COLORURINE YELLOW (A) 08/04/2019 0200   APPEARANCEUR CLEAR (A) 08/04/2019 0200   APPEARANCEUR Cloudy (A) 08/20/2017 1309   LABSPEC 1.037 (H) 08/04/2019 0200   LABSPEC  1.041 09/14/2013 1658   PHURINE 5.0 08/04/2019 0200   GLUCOSEU >=500 (  A) 08/04/2019 0200   GLUCOSEU 50 mg/dL 09/14/2013 1658   HGBUR MODERATE (A) 08/04/2019 0200   BILIRUBINUR NEGATIVE 08/04/2019 0200   BILIRUBINUR negative 04/08/2019 1205   BILIRUBINUR Negative 08/20/2017 1309   BILIRUBINUR Negative 09/14/2013 1658   KETONESUR 5 (A) 08/04/2019 0200   PROTEINUR NEGATIVE 08/04/2019 0200   UROBILINOGEN 0.2 04/08/2019 1205   NITRITE NEGATIVE 08/04/2019 0200   LEUKOCYTESUR NEGATIVE 08/04/2019 0200   LEUKOCYTESUR Negative 09/14/2013 1658    Radiological Exams on Admission: CT Angio Abd/Pel W and/or Wo Contrast  Result Date: 08/04/2019 CLINICAL DATA:  Abdominal pain and GI bleeding EXAM: CTA ABDOMEN AND PELVIS WITHOUT AND WITH CONTRAST TECHNIQUE: Multidetector CT imaging of the abdomen and pelvis was performed using the standard protocol during bolus administration of intravenous contrast. Multiplanar reconstructed images and MIPs were obtained and reviewed to evaluate the vascular anatomy. CONTRAST:  140m OMNIPAQUE IOHEXOL 350 MG/ML SOLN COMPARISON:  None. FINDINGS: VASCULAR Aorta: Atherosclerotic calcifications are noted without aneurysmal dilatation. Celiac: Patent without evidence of aneurysm, dissection, vasculitis or significant stenosis. SMA: Patent without evidence of aneurysm, dissection, vasculitis or significant stenosis. Renals: Both renal arteries are patent without evidence of aneurysm, dissection, vasculitis, fibromuscular dysplasia or significant stenosis. IMA: Patent without evidence of aneurysm, dissection, vasculitis or significant stenosis. Iliacs: Atherosclerotic calcifications are noted without focal stenosis. Veins: Changes consistent with portal hypertension are noted with a spontaneous portacaval shunt extending from the superior mesenteric vein to the IVC. There also changes consistent with recanalization of the umbilical vein and multiple collaterals and varices in the pelvis  related to these collaterals. These changes are stable from multiple previous exams. Review of the MIP images confirms the above findings. NON-VASCULAR Lower chest: No focal infiltrate or sizable effusion is seen. Hepatobiliary: Diffuse fatty infiltration of the liver is noted. Nodularity of the liver consistent with underlying cirrhosis is noted. The gallbladder is decompressed. Mild ascites surrounding the liver is noted. Pancreas: Unremarkable. No pancreatic ductal dilatation or surrounding inflammatory changes. Spleen: Normal in size without focal abnormality. Adrenals/Urinary Tract: Adrenal glands are within normal limits. Kidneys demonstrate a normal enhancement pattern bilaterally with the exception of the posterior aspect of the left kidney. Bladder is decompressed. Stomach/Bowel: The appendix has been surgically removed. The colon is decompressed. No obstructive or inflammatory changes are seen. The stomach and small bowel appear within normal limits. No areas of active extravasation are identified. No definitive pooling of contrast is seen to suggest acute GI hemorrhage. Lymphatic: No significant lymphadenopathy is identified. Reproductive: Prostate is unremarkable. Other: Mild ascites is noted.  No hernia is seen. Musculoskeletal: Degenerative changes of lumbar spine are noted. IMPRESSION: VASCULAR No pooling of contrast material to suggest active GI hemorrhage. 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 The venous changes are stable from previous exams. NON-VASCULAR Hepatic cirrhosis and mild fatty infiltration. Decreased enhancement in the posterior aspect of the left kidney new from a prior exam of 2016. This may be related to embolic phenomena although the possibility of pyelonephritis would deserve consideration as well. Correlation with laboratory values is recommended. No other focal abnormality is  noted. Electronically Signed   By: MInez CatalinaM.D.   On: 08/04/2019 01:15    EKG: Independently reviewed. Interpretation : Atrial fibrillation  Assessment/Plan 69year old male with a history of A. fib on Coumadin, diabetes, hypertension, diastolic heart failure CKD 3 and asthma presenting with 5 days of intractable diarrhea associated with generalized  weakness    Intractable diarrhea Generalized weakness -Patient with intractable diarrhea, nonbloody, associated with weakness -GI panel, C. difficile negative.  No suspicious food or affected contacts -Differential includes noninfectious diarrhea,  possibly to opiate withdrawal as patient ran out of chronic tramadol a week prior.  States he has been on tramadol 37.5, up to 8 tablets daily, for 14 years.  Patient has been on the same dose of Metformin for 1 year so Metformin intolerance less likely -Hold Metformin for now -IV hydration -Clear liquid diet and supportive care -Consider GI or ID consult  Anemia -Does not appear acute: Hemoglobin 9.7, up from 9.13 weeks ago, down from 10.42 months ago -Continue to monitor -Stool for occult blood and anemia panel   asthma -Stable, continue home albuterol    Chronic diastolic heart failure (HCC) -Stable -Continue home furosemide, lisinopril, metoprolol -Monitor closely for signs of fluid overload given IV hydration for diarrhea    Diabetes mellitus (HCC) -Sliding scale insulin coverage for now -We will hold Metformin due to possibility of etiology of diarrhea    Benign essential HTN -Continue home metoprolol and lisinopril    Other cirrhosis of liver (HCC) -Mild bilirubin elevation of 1.5.  Small amount of ascites seen on CT abdomen.  Stable    Chronic atrial fibrillation (HCC) Chronic anticoagulation -Continue metoprolol and warfarin    Morbid (severe) obesity due to excess calories ([place]) -Complicates overall prognosis and care.  Will benefit from dietary and lifestyle  counseling    CKD (chronic kidney disease), stage III -At baseline    DVT prophylaxis: Coumadin Code Status: full code  Family Communication:  none  Disposition Plan: Back to previous home environment Consults called: none  Status: Observation    Athena Masse MD [facility]     08/04/2019, 3:16 AM

## 2019-08-05 ENCOUNTER — Inpatient Hospital Stay (HOSPITAL_COMMUNITY)
Admit: 2019-08-05 | Discharge: 2019-08-05 | Disposition: A | Discharge status: Home / Self Care | Payer: Medicare HMO | Attending: Internal Medicine | Admitting: Internal Medicine

## 2019-08-05 DIAGNOSIS — I5032 Chronic diastolic (congestive) heart failure: Secondary | ICD-10-CM

## 2019-08-05 LAB — COMPREHENSIVE METABOLIC PANEL
ALT: 28 U/L (ref 0–44)
AST: 79 U/L — ABNORMAL HIGH (ref 15–41)
Albumin: 3.4 g/dL — ABNORMAL LOW (ref 3.5–5.0)
Alkaline Phosphatase: 59 U/L (ref 38–126)
Anion gap: 10 (ref 5–15)
BUN: 12 mg/dL (ref 8–23)
CO2: 25 mmol/L (ref 22–32)
Calcium: 9 mg/dL (ref 8.9–10.3)
Chloride: 102 mmol/L (ref 98–111)
Creatinine, Ser: 1.39 mg/dL — ABNORMAL HIGH (ref 0.61–1.24)
GFR calc Af Amer: 59 mL/min — ABNORMAL LOW (ref >60)
GFR calc non Af Amer: 51 mL/min — ABNORMAL LOW (ref >60)
Glucose, Bld: 153 mg/dL — ABNORMAL HIGH (ref 70–99)
Potassium: 3.4 mmol/L — ABNORMAL LOW (ref 3.5–5.1)
Sodium: 137 mmol/L (ref 135–145)
Total Bilirubin: 1.3 mg/dL — ABNORMAL HIGH (ref 0.3–1.2)
Total Protein: 7.7 g/dL (ref 6.5–8.1)

## 2019-08-05 LAB — CBC
HCT: 32.3 % — ABNORMAL LOW (ref 39.0–52.0)
Hemoglobin: 10 g/dL — ABNORMAL LOW (ref 13.0–17.0)
MCH: 29.2 pg (ref 26.0–34.0)
MCHC: 31 g/dL (ref 30.0–36.0)
MCV: 94.2 fL (ref 80.0–100.0)
Platelets: 253 10*3/uL (ref 150–400)
RBC: 3.43 MIL/uL — ABNORMAL LOW (ref 4.22–5.81)
RDW: 16.9 % — ABNORMAL HIGH (ref 11.5–15.5)
WBC: 6.5 10*3/uL (ref 4.0–10.5)
nRBC: 0 % (ref 0.0–0.2)

## 2019-08-05 LAB — PROTIME-INR
INR: 3 — ABNORMAL HIGH (ref 0.8–1.2)
Prothrombin Time: 30.5 seconds — ABNORMAL HIGH (ref 11.4–15.2)

## 2019-08-05 LAB — GLUCOSE, CAPILLARY
Glucose-Capillary: 144 mg/dL — ABNORMAL HIGH (ref 70–99)
Glucose-Capillary: 199 mg/dL — ABNORMAL HIGH (ref 70–99)
Glucose-Capillary: 189 mg/dL — ABNORMAL HIGH (ref 70–99)
Glucose-Capillary: 188 mg/dL — ABNORMAL HIGH (ref 70–99)

## 2019-08-05 LAB — ECHOCARDIOGRAM COMPLETE
Height: 68 in
Weight: 4980.8 oz

## 2019-08-05 MED ORDER — WARFARIN SODIUM 2.5 MG PO TABS
2.5000 mg | ORAL_TABLET | Freq: Once | ORAL | Status: AC
  Administered 2019-08-05: 2.5 mg via ORAL
  Filled 2019-08-05: qty 1

## 2019-08-05 MED ORDER — POTASSIUM CHLORIDE CRYS ER 20 MEQ PO TBCR
40.0000 meq | EXTENDED_RELEASE_TABLET | Freq: Once | ORAL | Status: AC
  Administered 2019-08-05: 40 meq via ORAL
  Filled 2019-08-05: qty 2

## 2019-08-05 MED ORDER — FUROSEMIDE 10 MG/ML IJ SOLN
60.0000 mg | Freq: Once | INTRAMUSCULAR | Status: AC
  Administered 2019-08-05: 60 mg via INTRAVENOUS
  Filled 2019-08-05: qty 6

## 2019-08-05 NOTE — Consult Note (Signed)
[place] for Warfarin  Indication: atrial fibrillation  Allergies  Allergen Reactions  . Clindamycin/Lincomycin     Chest discomfort  . Sulfa Antibiotics Hives  . Sulfasalazine Hives    Patient Measurements: Height: 5' 8"  (172.7 cm) Weight: (!) 141.2 kg (311 lb 4.8 oz) IBW/kg (Calculated) : 68.4  Vital Signs: Temp: 98.2 F (36.8 C) (07/09 0915) Temp Source: Oral (07/09 0748) BP: 126/69 (07/09 0915) Pulse Rate: 87 (07/09 0915)  Labs: Recent Labs    08/03/19 1720 08/03/19 1720 08/03/19 2355 08/04/19 1136 08/05/19 0738 08/05/19 0853  HGB 9.7*   < >  --  8.7* 10.0*  --   HCT 31.4*  --   --  27.7* 32.3*  --   PLT 242  --   --  201 253  --   LABPROT  --   --  26.6* 28.3*  --  30.5*  INR  --   --  2.6* 2.8*  --  3.0*  CREATININE 1.32*  --   --  1.28* 1.39*  --    < > = values in this interval not displayed.    Estimated Creatinine Clearance: 68.2 mL/min (A) (by C-G formula based on SCr of 1.39 mg/dL (H)).   Medical History: Past Medical History:  Diagnosis Date  . A-fib ([place])   . Carotid stenosis   . CHF (congestive heart failure) ([place])   . Degenerative disc disease, lumbar    s/p injury  . Diabetes mellitus without complication ([place])   . Disseminated superficial actinic porokeratosis   . Dysrhythmia    A-FIB, palpatations  . 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 Fatty liver   . History of degenerative disc disease   . History of kidney stones   . Hypercholesteremia   . Hypertension   . Kidney stones   . Obesity   . Pacemaker    inactive  . Peripheral vascular disease ([place])    Carotid stenosis  . Presence of permanent cardiac pacemaker    Inactive  . TIA (transient ischemic attack) 2011   No deficits  . TIA (transient ischemic attack)   . Vitamin B12 deficiency     Assessment: Pharmacy consulted for warfarin dosing and monitoring for 69 yo male with PMH of A. Fib. DDI: asa 81.   Home Regimen: Warfarin 53m MWF                                Warfarin 7.521mT,TH, Sat, Sun   DATE INR DOSE 7/7 2.6 - 7/8 2.8 6 mg 7/9 3 2.5 mg  Goal of Therapy:  INR 2-3 Monitor platelets by anticoagulation protocol: Yes   Plan:  INR is therapeutic at the upper range of goal. Will order warfarin 2.5 mg x 1 tonight (half of home dose). INR trending up, if INR > 3 tomorrow, consider holding warfarin dose x 1 days. Daily INR ordered. CBC at least every 3 days.    KiOswald HillockPharmD, BCPS Clinical Pharmacist 08/05/2019 10:01 AM

## 2019-08-05 NOTE — Progress Notes (Addendum)
PROGRESS NOTE    Christian Berg  ASN:[id number] DOB: 1950/10/26 DOA: 08/03/2019 PCP: Birdie Sons, MD    Brief Narrative:  Christian Berg is a 69 y.o. male with medical history significant for A. fib on Coumadin, hypertension, diabetes, cirrhosis secondary to Karlene Lineman, asthma who presents to the emergency room with a 5-day history of diarrhea associated with abdominal cramping and worsening generalized weakness.  Patient is having at least 5 bowel movements a day, copious amounts, watery, dark but not black and without blood.  His abdominal pain is colicky and generalized, of mild to moderate intensity unassociated with bowel movements.  No alleviating factors.  Denies recent travel or suspicious food intake and has no affected contacts.  Reports that he ran out of his Ultram about a week prior to the onset of the symptoms.  He has had no vomiting fever or chills.  Denies cough or chest pain.  States that he has difficulty getting about due to weakness related to his diarrhea. ED Course: On arrival he was tachycardic at 102 with otherwise normal vitals.  Blood work significant for normal white cell count of 7000 but with lactic acid 4.4.  Hemoglobin 9.7, with most recent baseline 2 months ago of 10.4.  Lipase 59, creatinine 1.32 which is baseline, and mildly elevated total bilirubin of 1.5.  CT abdomen and pelvis showed no pooling of contrast material to suggest active GI hemorrhage.  Showed findings associated with known hepatic cirrhosis.  GI panel, stool for C. difficile and C. difficile toxin all negative.  Hospitalist consulted for admission.   Assessment & Plan:   Principal Problem:   Intractable diarrhea Active Problems:   Asthma   Chronic diastolic heart failure (HCC)   Diabetes mellitus (HCC)   Benign essential HTN   Other cirrhosis of liver (HCC)   Chronic atrial fibrillation (HCC)   Morbid (severe) obesity due to excess calories (HCC)   Chronic anticoagulation   CKD (chronic  kidney disease), stage III   Atrial fibrillation with RVR (HCC)   Intractable diarrhea.  GI pathogen panel and C. difficile found to be negative.  He was afebrile at home.  Possible etiologies include opiate withdrawal since he ran out of tramadol 1 week ago.  Diarrhea appears to have improved. He is tolerating clear liquids. Will advance to soft foods.  Continue supportive care.   Acute on chronic diastolic congestive heart failure.  Patient continues to have evidence of volume overload.  BNP 368. Will give another dose of lasix today. Continue on beta-blockers. Echocardiogram shows preserved EF.    Lactic acidosis.  Felt to be related to volume depletion from persistent diarrhea.  He received 3 L of IV fluids in the emergency room.  Lactic acid still mildly elevated, but clinically the patient does not appear to be dehydrated or volume depleted.  Blood pressure stable.  Metformin use could have also been contributing.  Continue to monitor.  Hold Metformin. Recheck in AM.  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  Chronically on Metformin, Jardiance and glipizide.  These are currently on hold.  He is on sliding scale insulin. CBGs currently stable  Atrial fibrillation with rapid ventricular response.  Was started on diltiazem infusion yesterday which has since been discontinued. Patient is currently on metoprolol.  He is anticoagulated with Coumadin.  HR appears to be stabilizing.  Acquired Thrombophilia. Patient at increased risk for thrombus due to A fib. He is on coumadin for stroke  prevention  Chronic kidney disease stage III.  Creatinine is currently at baseline.  Continue to monitor with diuresis.  Hypertension.  On chronic metoprolol and lisinopril.  Blood pressures currently stable.  Continue to monitor.  Anemia.  Likely related to chronic kidney disease. Hemoglobin is currently near baseline.  Continue to monitor.  No signs of  bleeding   DVT prophylaxis:  warfarin (COUMADIN) tablet 2.5 mg  Code Status: full code Family Communication: discussed with patient Disposition Plan: Status is: Inpatient  Remains inpatient appropriate because:Inpatient level of care appropriate due to severity of illness   Dispo: The patient is from: Home              Anticipated d/c is to: Home              Anticipated d/c date is: 1 day              Patient currently is not medically stable to d/c.     Consultants:     Procedures:   Echo: 1. Left ventricular ejection fraction, by estimation, is 60 to 65%. The  left ventricle has normal function. The left ventricle has no regional  wall motion abnormalities. Left ventricular diastolic parameters are  indeterminate.  2. Right ventricular systolic function is normal. The right ventricular  size is normal. There is normal pulmonary artery systolic pressure.  3. Left atrial size was mildly dilated.   Antimicrobials:       Subjective: No further diarrhea. No abdominal pain. No vomiting. He does have some shortness of breath on exertion  Objective: Vitals:   08/05/19 0440 08/05/19 0748 08/05/19 0915 08/05/19 1156  BP: (!) 100/38 139/72 126/69 (!) 102/59  Pulse: 99 (!) 118 87 94  Resp:   20   Temp: 98.8 F (37.1 C) 98.1 F (36.7 C) 98.2 F (36.8 C) 97.9 F (36.6 C)  TempSrc: Oral Oral  Oral  SpO2: 95% 99% 97% 96%  Weight: (!) 141.2 kg     Height:        Intake/Output Summary (Last 24 hours) at 08/05/2019 1321 Last data filed at 08/05/2019 0950 Gross per 24 hour  Intake 262.5 ml  Output 1150 ml  Net -887.5 ml   Filed Weights   08/03/19 1703 08/04/19 1812 08/05/19 0440  Weight: 135.6 kg (!) 144.7 kg (!) 141.2 kg    Examination:  General exam: Appears calm and comfortable  Respiratory system: Crackles at bases. Respiratory effort normal. Cardiovascular system: S1 & S2 heard, RRR. No JVD, murmurs, rubs, gallops or clicks. No pedal  edema. Gastrointestinal system: Abdomen is nondistended, soft and nontender. No organomegaly or masses felt. Normal bowel sounds heard. Central nervous system: Alert and oriented. No focal neurological deficits. Extremities: 1-2+ edema bilaterally Skin: No rashes, lesions or ulcers Psychiatry: Judgement and insight appear normal. Mood & affect appropriate.     Data Reviewed: I have personally reviewed following labs and imaging studies  CBC: Recent Labs  Lab 08/03/19 1720 08/04/19 1136 08/05/19 0738  WBC 7.0 4.9 6.5  HGB 9.7* 8.7* 10.0*  HCT 31.4* 27.7* 32.3*  MCV 94.0 92.6 94.2  PLT 242 201 086   Basic Metabolic Panel: Recent Labs  Lab 08/03/19 1720 08/04/19 1136 08/05/19 0738  NA 138 138 137  K 4.0 3.6 3.4*  CL 105 105 102  CO2 21* 26 25  GLUCOSE 147* 175* 153*  BUN 16 14 12   CREATININE 1.32* 1.28* 1.39*  CALCIUM 9.0 8.7* 9.0   GFR: Estimated Creatinine  Clearance: 68.2 mL/min (A) (by C-G formula based on SCr of 1.39 mg/dL (H)). Liver Function Tests: Recent Labs  Lab 08/03/19 1720 08/05/19 0738  AST 88* 79*  ALT 27 28  ALKPHOS 68 59  BILITOT 1.5* 1.3*  PROT 7.5 7.7  ALBUMIN 3.2* 3.4*   Recent Labs  Lab 08/03/19 1720  LIPASE 59*   No results for input(s): AMMONIA in the last 168 hours. Coagulation Profile: Recent Labs  Lab 08/03/19 2355 08/04/19 1136 08/05/19 0853  INR 2.6* 2.8* 3.0*   Cardiac Enzymes: No results for input(s): CKTOTAL, CKMB, CKMBINDEX, TROPONINI in the last 168 hours. BNP (last 3 results) No results for input(s): PROBNP in the last 8760 hours. HbA1C: Recent Labs    08/04/19 0431  HGBA1C 6.3*   CBG: Recent Labs  Lab 08/04/19 1154 08/04/19 1731 08/04/19 2056 08/05/19 0749 08/05/19 1157  GLUCAP 153* 146* 182* 144* 199*   Lipid Profile: No results for input(s): CHOL, HDL, LDLCALC, TRIG, CHOLHDL, LDLDIRECT in the last 72 hours. Thyroid Function Tests: Recent Labs    08/04/19 1136  TSH 2.429   Anemia  Panel: Recent Labs    08/04/19 0431  VITAMINB12 278  FOLATE 9.0  FERRITIN 24  TIBC 424  IRON 34*   Sepsis Labs: Recent Labs  Lab 08/03/19 2355 08/04/19 0301  LATICACIDVEN 4.4* 3.1*    Recent Results (from the past 240 hour(s))  Gastrointestinal Panel by PCR , Stool     Status: None   Collection Time: 08/03/19 11:55 PM   Specimen: STOOL  Result Value Ref Range Status   Campylobacter species NOT DETECTED NOT DETECTED Final   Plesimonas shigelloides NOT DETECTED NOT DETECTED Final   Salmonella species NOT DETECTED NOT DETECTED Final   Yersinia enterocolitica NOT DETECTED NOT DETECTED Final   Vibrio species NOT DETECTED NOT DETECTED Final   Vibrio cholerae NOT DETECTED NOT DETECTED Final   Enteroaggregative E coli (EAEC) NOT DETECTED NOT DETECTED Final   Enteropathogenic E coli (EPEC) NOT DETECTED NOT DETECTED Final   Enterotoxigenic E coli (ETEC) NOT DETECTED NOT DETECTED Final   Shiga like toxin producing E coli (STEC) NOT DETECTED NOT DETECTED Final   Shigella/Enteroinvasive E coli (EIEC) NOT DETECTED NOT DETECTED Final   Cryptosporidium NOT DETECTED NOT DETECTED Final   Cyclospora cayetanensis NOT DETECTED NOT DETECTED Final   Entamoeba histolytica NOT DETECTED NOT DETECTED Final   Giardia lamblia NOT DETECTED NOT DETECTED Final   Adenovirus F40/41 NOT DETECTED NOT DETECTED Final   Astrovirus NOT DETECTED NOT DETECTED Final   Norovirus GI/GII NOT DETECTED NOT DETECTED Final   Rotavirus A NOT DETECTED NOT DETECTED Final   Sapovirus (I, II, IV, and V) NOT DETECTED NOT DETECTED Final    Comment: Performed at [facility], [address] [place], [place] [address]  C Difficile Quick Screen w PCR reflex     Status: None   Collection Time: 08/03/19 11:55 PM   Specimen: STOOL  Result Value Ref Range Status   C Diff antigen NEGATIVE NEGATIVE Final   C Diff toxin NEGATIVE NEGATIVE Final   C Diff interpretation No C. difficile detected.  Final    Comment: Performed  at [facility], [address] [place], [place] [address]  SARS Coronavirus 2 by RT PCR (hospital order, performed in [facility] hospital lab) Nasopharyngeal Nasopharyngeal Swab     Status: None   Collection Time: 08/04/19  2:00 AM   Specimen: Nasopharyngeal Swab  Result Value Ref Range Status  SARS Coronavirus 2 NEGATIVE NEGATIVE Final    Comment: (NOTE) SARS-CoV-2 target nucleic acids are NOT DETECTED.  The SARS-CoV-2 RNA is generally detectable in upper and lower respiratory specimens during the acute phase of infection. The lowest concentration of SARS-CoV-2 viral copies this assay can detect is 250 copies / mL. A negative result does not preclude SARS-CoV-2 infection and should not be used as the sole basis for treatment or other patient management decisions.  A negative result may occur with improper specimen collection / handling, submission of specimen other than nasopharyngeal swab, presence of viral mutation(s) within the areas targeted by this assay, and inadequate number of viral copies (<250 copies / mL). A negative result must be combined with clinical observations, patient history, and epidemiological information.  Fact Sheet for Patients:   StrictlyIdeas.no  Fact Sheet for Healthcare Providers: BankingDealers.co.za  This test is not yet approved or  cleared by the [place] FDA and has been authorized for detection and/or diagnosis of SARS-CoV-2 by FDA under an Emergency Use Authorization (EUA).  This EUA will remain in effect (meaning this test can be used) for the duration of the COVID-19 declaration under Section 564(b)(1) of the Act, 21 U.S.C. section 360bbb-3(b)(1), unless the authorization is terminated or revoked sooner.  Performed at [facility], [address] [place], [place] [address]   Blood culture (routine x 2)     Status: None (Preliminary result)   Collection Time:  08/04/19  3:01 AM   Specimen: BLOOD LEFT FOREARM  Result Value Ref Range Status   Specimen Description BLOOD LEFT FOREARM  Final   Special Requests   Final    BOTTLES DRAWN AEROBIC AND ANAEROBIC Blood Culture adequate volume   Culture   Final    NO GROWTH 1 DAY Performed at [facility], [address] [place], [place] [address]    Report Status PENDING  Incomplete  Blood culture (routine x 2)     Status: None (Preliminary result)   Collection Time: 08/04/19  3:01 AM   Specimen: BLOOD RIGHT FOREARM  Result Value Ref Range Status   Specimen Description BLOOD RIGHT FOREARM  Final   Special Requests   Final    BOTTLES DRAWN AEROBIC AND ANAEROBIC Blood Culture adequate volume   Culture   Final    NO GROWTH 1 DAY Performed at [facility], [address], [place], [place] [address]    Report Status PENDING  Incomplete         Radiology Studies: ECHOCARDIOGRAM COMPLETE  Result Date: 08/05/2019    ECHOCARDIOGRAM REPORT   Patient Name:   ISAAH FURRY Date of Exam: 08/05/2019 Medical Rec #:  [id number]       Height:       68.0 in Accession #:    [id number]      Weight:       311.3 lb Date of Birth:  11/19/1950      BSA:          2.467 m Patient Age:    67 years        BP:           126/69 mmHg Patient Gender: M               HR:           87 bpm. Exam Location:  [facility] Procedure: 2D Echo, Cardiac Doppler and Color Doppler Indications:     CHF 428.0  History:  Patient has no prior history of Echocardiogram examinations.                  Pacemaker, TIA, Arrythmias:Atrial Fibrillation; Risk                  Factors:Diabetes and Hypertension.  Sonographer:     Sherrie Sport RDCS (AE) Referring Phys:  [id number] [facility] Tekia [place] Diagnosing Phys: Ida Rogue MD  Sonographer Comments: Technically challenging study due to limited acoustic windows, no apical window and no subcostal window. IMPRESSIONS  1. Left ventricular ejection fraction, by estimation, is 60 to 65%. The left  ventricle has normal function. The left ventricle has no regional wall motion abnormalities. Left ventricular diastolic parameters are indeterminate.  2. Right ventricular systolic function is normal. The right ventricular size is normal. There is normal pulmonary artery systolic pressure.  3. Left atrial size was mildly dilated. FINDINGS  Left Ventricle: Left ventricular ejection fraction, by estimation, is 60 to 65%. The left ventricle has normal function. The left ventricle has no regional wall motion abnormalities. The left ventricular internal cavity size was normal in size. There is  no left ventricular hypertrophy. Left ventricular diastolic parameters are indeterminate. Right Ventricle: The right ventricular size is normal. No increase in right ventricular wall thickness. Right ventricular systolic function is normal. There is normal pulmonary artery systolic pressure. The tricuspid regurgitant velocity is 2.14 m/s, and  with an assumed right atrial pressure of 10 mmHg, the estimated right ventricular systolic pressure is 09.4 mmHg. Left Atrium: Left atrial size was mildly dilated. Right Atrium: Right atrial size was normal in size. Pericardium: There is no evidence of pericardial effusion. Mitral Valve: The mitral valve is normal in structure. Normal mobility of the mitral valve leaflets. No evidence of mitral valve regurgitation. No evidence of mitral valve stenosis. Tricuspid Valve: The tricuspid valve is normal in structure. Tricuspid valve regurgitation is trivial. No evidence of tricuspid stenosis. Aortic Valve: The aortic valve is normal in structure. Aortic valve regurgitation is not visualized. Mild to moderate aortic valve sclerosis/calcification is present, without any evidence of aortic stenosis. Pulmonic Valve: The pulmonic valve was normal in structure. Pulmonic valve regurgitation is not visualized. No evidence of pulmonic stenosis. Aorta: The aortic root is normal in size and structure.  Venous: The inferior vena cava is normal in size with greater than 50% respiratory variability, suggesting right atrial pressure of 3 mmHg. IAS/Shunts: No atrial level shunt detected by color flow Doppler.  LEFT VENTRICLE PLAX 2D LVIDd:         4.85 cm LVIDs:         3.23 cm LV PW:         0.93 cm LV IVS:        1.58 cm LVOT diam:     2.10 cm LVOT Area:     3.46 cm  LEFT ATRIUM         Index LA diam:    5.10 cm 2.07 cm/m                        PULMONIC VALVE AORTA                 PV Vmax:        0.60 m/s Ao Root diam: 3.30 cm PV Peak grad:   1.4 mmHg                       RVOT Peak  grad: 2 mmHg  TRICUSPID VALVE TR Peak grad:   18.3 mmHg TR Vmax:        214.00 cm/s  SHUNTS Systemic Diam: 2.10 cm Ida Rogue MD Electronically signed by Ida Rogue MD Signature Date/Time: 08/05/2019/11:19:26 AM    Final    CT Angio Abd/Pel W and/or Wo Contrast  Result Date: 08/04/2019 CLINICAL DATA:  Abdominal pain and GI bleeding EXAM: CTA ABDOMEN AND PELVIS WITHOUT AND WITH CONTRAST TECHNIQUE: Multidetector CT imaging of the abdomen and pelvis was performed using the standard protocol during bolus administration of intravenous contrast. Multiplanar reconstructed images and MIPs were obtained and reviewed to evaluate the vascular anatomy. CONTRAST:  117m OMNIPAQUE IOHEXOL 350 MG/ML SOLN COMPARISON:  None. FINDINGS: VASCULAR Aorta: Atherosclerotic calcifications are noted without aneurysmal dilatation. Celiac: Patent without evidence of aneurysm, dissection, vasculitis or significant stenosis. SMA: Patent without evidence of aneurysm, dissection, vasculitis or significant stenosis. Renals: Both renal arteries are patent without evidence of aneurysm, dissection, vasculitis, fibromuscular dysplasia or significant stenosis. IMA: Patent without evidence of aneurysm, dissection, vasculitis or significant stenosis. Iliacs: Atherosclerotic calcifications are noted without focal stenosis. Veins: Changes consistent with portal  hypertension are noted with a spontaneous portacaval shunt extending from the superior mesenteric vein to the IVC. There also changes consistent with recanalization of the umbilical vein and multiple collaterals and varices in the pelvis related to these collaterals. These changes are stable from multiple previous exams. Review of the MIP images confirms the above findings. NON-VASCULAR Lower chest: No focal infiltrate or sizable effusion is seen. Hepatobiliary: Diffuse fatty infiltration of the liver is noted. Nodularity of the liver consistent with underlying cirrhosis is noted. The gallbladder is decompressed. Mild ascites surrounding the liver is noted. Pancreas: Unremarkable. No pancreatic ductal dilatation or surrounding inflammatory changes. Spleen: Normal in size without focal abnormality. Adrenals/Urinary Tract: Adrenal glands are within normal limits. Kidneys demonstrate a normal enhancement pattern bilaterally with the exception of the posterior aspect of the left kidney. Bladder is decompressed. Stomach/Bowel: The appendix has been surgically removed. The colon is decompressed. No obstructive or inflammatory changes are seen. The stomach and small bowel appear within normal limits. No areas of active extravasation are identified. No definitive pooling of contrast is seen to suggest acute GI hemorrhage. Lymphatic: No significant lymphadenopathy is identified. Reproductive: Prostate is unremarkable. Other: Mild ascites is noted.  No hernia is seen. Musculoskeletal: Degenerative changes of lumbar spine are noted. IMPRESSION: VASCULAR No pooling of contrast material to suggest active GI hemorrhage. 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 The venous changes are stable from previous exams. NON-VASCULAR Hepatic cirrhosis and mild fatty infiltration. Decreased enhancement in the posterior aspect of the left  kidney new from a prior exam of 2016. This may be related to embolic phenomena although the possibility of pyelonephritis would deserve consideration as well. Correlation with laboratory values is recommended. No other focal abnormality is noted. Electronically Signed   By: MInez CatalinaM.D.   On: 08/04/2019 01:15        Scheduled Meds: . aspirin EC  81 mg Oral QPM  . insulin aspart  0-15 Units Subcutaneous TID WC  . insulin aspart  0-5 Units Subcutaneous QHS  . metoprolol tartrate  100 mg Oral BID  . ondansetron (ZOFRAN) IV  4 mg Intravenous Once  . traMADol  50 mg Oral BID  . warfarin  2.5 mg Oral ONCE-1600  . Warfarin - Pharmacist  Dosing Inpatient   Does not apply q1600   Continuous Infusions: . diltiazem (CARDIZEM) infusion Stopped (08/04/19 2254)     LOS: 1 day    Time spent: 32mns    JKathie Dike MD [facility]   If 7PM-7AM, please contact night-coverage [email or web address]  08/05/2019, 1:21 PM

## 2019-08-05 NOTE — Progress Notes (Signed)
*  PRELIMINARY RESULTS* Echocardiogram 2D Echocardiogram has been performed.  Sherrie Sport 08/05/2019, 9:22 AM

## 2019-08-05 NOTE — Progress Notes (Deleted)
Patient had an episode of acute hypoxic hypercapnic respiratory failure and chest pain. Patient treated with nitroglycerin SL x 3 and Morphine with improvement in chest pain. He also received bronchodilators. Chest xray obtained and showed no new cardiopulmonary process. EKG unchanged form previous. Patient is currently stable for transfer from medical standpoint.

## 2019-08-06 DIAGNOSIS — G8929 Other chronic pain: Secondary | ICD-10-CM

## 2019-08-06 DIAGNOSIS — M545 Low back pain, unspecified: Secondary | ICD-10-CM

## 2019-08-06 DIAGNOSIS — N183 Chronic kidney disease, stage 3 unspecified: Secondary | ICD-10-CM

## 2019-08-06 DIAGNOSIS — D6869 Other thrombophilia: Secondary | ICD-10-CM

## 2019-08-06 DIAGNOSIS — N1831 Chronic kidney disease, stage 3a: Secondary | ICD-10-CM

## 2019-08-06 DIAGNOSIS — I5033 Acute on chronic diastolic (congestive) heart failure: Secondary | ICD-10-CM

## 2019-08-06 DIAGNOSIS — E1122 Type 2 diabetes mellitus with diabetic chronic kidney disease: Secondary | ICD-10-CM

## 2019-08-06 DIAGNOSIS — E1121 Type 2 diabetes mellitus with diabetic nephropathy: Secondary | ICD-10-CM

## 2019-08-06 DIAGNOSIS — D500 Iron deficiency anemia secondary to blood loss (chronic): Secondary | ICD-10-CM

## 2019-08-06 LAB — BASIC METABOLIC PANEL
Anion gap: 10 (ref 5–15)
BUN: 13 mg/dL (ref 8–23)
CO2: 24 mmol/L (ref 22–32)
Calcium: 8.8 mg/dL — ABNORMAL LOW (ref 8.9–10.3)
Chloride: 102 mmol/L (ref 98–111)
Creatinine, Ser: 1.46 mg/dL — ABNORMAL HIGH (ref 0.61–1.24)
GFR calc Af Amer: 56 mL/min — ABNORMAL LOW (ref >60)
GFR calc non Af Amer: 48 mL/min — ABNORMAL LOW (ref >60)
Glucose, Bld: 208 mg/dL — ABNORMAL HIGH (ref 70–99)
Potassium: 3.9 mmol/L (ref 3.5–5.1)
Sodium: 136 mmol/L (ref 135–145)

## 2019-08-06 LAB — PROTIME-INR
INR: 3.3 — ABNORMAL HIGH (ref 0.8–1.2)
Prothrombin Time: 32.2 seconds — ABNORMAL HIGH (ref 11.4–15.2)

## 2019-08-06 LAB — LACTIC ACID, PLASMA: Lactic Acid, Venous: 2.2 mmol/L (ref 0.5–1.9)

## 2019-08-06 LAB — GLUCOSE, CAPILLARY: Glucose-Capillary: 187 mg/dL — ABNORMAL HIGH (ref 70–99)

## 2019-08-06 MED ORDER — KETOCONAZOLE 2 % EX CREA: 1.0000 "application " | TOPICAL_CREAM | Freq: Every day | CUTANEOUS | Status: DC | PRN

## 2019-08-06 MED ORDER — DOXYCYCLINE HYCLATE 100 MG PO TABS: 100.0000 mg | ORAL_TABLET | Freq: Two times a day (BID) | ORAL | 0 refills | Status: DC

## 2019-08-06 MED ORDER — TRAMADOL-ACETAMINOPHEN 37.5-325 MG PO TABS: 1.0000 | ORAL_TABLET | Freq: Four times a day (QID) | ORAL | 0 refills | Status: AC | PRN

## 2019-08-06 MED ORDER — DILTIAZEM HCL ER COATED BEADS 120 MG PO CP24
120.0000 mg | ORAL_CAPSULE | Freq: Every day | ORAL | 0 refills | Status: DC
Start: 2019-08-07 — End: 2019-10-04

## 2019-08-06 MED ORDER — DOXYCYCLINE HYCLATE 100 MG PO TABS
100.0000 mg | ORAL_TABLET | Freq: Two times a day (BID) | ORAL | Status: DC
  Administered 2019-08-06: 100 mg via ORAL
  Filled 2019-08-06: qty 1

## 2019-08-06 MED ORDER — DILTIAZEM HCL ER COATED BEADS 120 MG PO CP24
120.0000 mg | ORAL_CAPSULE | Freq: Every day | ORAL | Status: DC
  Administered 2019-08-06: 120 mg via ORAL
  Filled 2019-08-06: qty 1

## 2019-08-06 NOTE — Discharge Summary (Signed)
[facility] NAME: Jaymz Traywick    MR#:  [id number]  DATE OF BIRTH:  [birth year]  DATE OF ADMISSION:  08/03/2019 ADMITTING PHYSICIAN: Athena Masse, MD  DATE OF DISCHARGE: 08/06/2019 12:18 PM  PRIMARY CARE PHYSICIAN: Birdie Sons, MD    ADMISSION DIAGNOSIS:  Diarrhea of presumed infectious origin [R19.7] Tachycardia [R00.0] Atrial fibrillation with RVR ([place]) [I48.91] Intractable diarrhea [R19.7]  DISCHARGE DIAGNOSIS:  Principal Problem:   Intractable diarrhea Active Problems:   Asthma   Chronic diastolic heart failure (HCC)   Diabetes mellitus (HCC)   Benign essential HTN   Other cirrhosis of liver (HCC)   Chronic atrial fibrillation (HCC)   Morbid (severe) obesity due to excess calories (HCC)   Chronic anticoagulation   CKD (chronic kidney disease), stage III   Atrial fibrillation with RVR ([place])   SECONDARY DIAGNOSIS:   Past Medical History:  Diagnosis Date  . A-fib ([place])   . Carotid stenosis   . CHF (congestive heart failure) ([place])   . Degenerative disc disease, lumbar    s/p injury  . Diabetes mellitus without complication ([place])   . Disseminated superficial actinic porokeratosis   . Dysrhythmia    A-FIB, palpatations  . 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 Fatty liver   . History of degenerative disc disease   . History of kidney stones   . Hypercholesteremia   . Hypertension   . Kidney stones   . Obesity   . Pacemaker    inactive  . Peripheral vascular disease ([place])    Carotid stenosis  . Presence of permanent cardiac pacemaker    Inactive  . TIA (transient ischemic attack) 2011   No deficits  . TIA (transient ischemic attack)   . Vitamin B12 deficiency     HOSPITAL COURSE:   1.  Acute diarrhea.  Stool for C. difficile negative.  GI panel negative.  Can be secondary to opioid withdrawal since he ran out of Ultracet 1 week prior to presentation.  Could also be secondary to Glucophage.  Patient will  restart Glucophage and see if the diarrhea restarts.  If this restarts can hold off on the Glucophage. 2.  Atrial fibrillation with rapid ventricular response.  Patient was on diltiazem infusion and currently on metoprolol.  Will and oral diltiazem to his regimen.  Anticoagulated with Coumadin and Coumadin is therapeutic.  Recommend checking a INR on follow-up appointment. 3.  Acquired thrombophilia secondary to atrial fibrillation and high risk for thrombus and clot.  On Coumadin for stroke prevention. 4.  Acute on chronic diastolic congestive heart failure.  Patient was given a couple doses of IV Lasix while here can go back on usual Lasix dose at home.  Patient already on ACE inhibitor and beta-blocker. 5.  Lactic acidosis secondary to volume depletion with diarrhea on presentation.  Blood cultures negative. 6.  Chronic kidney disease stage IIIa.  Creatinine variable during the hospital course ranging anywhere from 1.28-1.46. 7.  Essential hypertension continue usual medications 8.  Iron deficiency anemia.  Ferritin 24.  Last hemoglobin 10.  Continue to watch closely.  B12 also slightly low normal at 278. 9.  Buttock area of inflammation and scab.  Patient states that he uses a wart stick on it every so often when it gets inflamed.  I will give a course of antibiotics with doxycycline and follow-up as outpatient.  May end up needing referral to dermatology as outpatient.  I think warm soaks would help  rather than the wart stick. 10.  Chronic back pain.  Patient ran out of his Ultracet for a week.  I prescribed 10 pills without any refills.  He will have to contact his doctor that prescribes this on Monday. 11.  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  Can go back on oral meds as outpatient.  Watch for signs of low sugars with sulfonylurea.  Can consider discontinuing this agent as outpatient. 12.  CT scan showing nodularity which could go along with cirrhosis.  Further  follow-up as outpatient needed. DISCHARGE CONDITIONS:   Satisfactory  CONSULTS OBTAINED:  None  DRUG ALLERGIES:   Allergies  Allergen Reactions  . Clindamycin/Lincomycin     Chest discomfort  . Sulfa Antibiotics Hives  . Sulfasalazine Hives    DISCHARGE MEDICATIONS:   Allergies as of 08/06/2019      Reactions   Clindamycin/lincomycin    Chest discomfort   Sulfa Antibiotics Hives   Sulfasalazine Hives      Medication List    STOP taking these medications   clotrimazole 1 % cream Commonly known as: Clotrimazole AF   lidocaine 5 % Commonly known as: Lidoderm   traMADol 100 MG 24 hr tablet Commonly known as: ULTRAM-ER     TAKE these medications   albuterol 108 (90 Base) MCG/ACT inhaler Commonly known as: VENTOLIN HFA Inhale 2 puffs into the lungs every 6 (six) hours as needed for wheezing or shortness of breath.   aspirin EC 81 MG tablet Take 81 mg by mouth every evening.   diltiazem 120 MG 24 hr capsule Commonly known as: Cardizem CD Take 1 capsule (120 mg total) by mouth daily. Start taking on: August 07, 2019   doxycycline 100 MG tablet Commonly known as: VIBRA-TABS Take 1 tablet (100 mg total) by mouth every 12 (twelve) hours.   ferrous sulfate 325 (65 FE) MG tablet Take 325 mg by mouth daily with breakfast.   furosemide 20 MG tablet Commonly known as: LASIX Take 1 tablet by mouth once daily   glipiZIDE 10 MG tablet Commonly known as: GLUCOTROL TAKE 1 TABLET BY MOUTH TWICE DAILY BEFORE MEAL(S) What changed: See the new instructions.   Jardiance 10 MG Tabs tablet Generic drug: empagliflozin Take 10 mg by mouth daily before breakfast.   ketoconazole 2 % cream Commonly known as: NIZORAL Apply 1 application topically daily as needed for irritation.   lisinopril 20 MG tablet Commonly known as: ZESTRIL Take 1 tablet (20 mg total) by mouth daily.   lovastatin 20 MG tablet Commonly known as: MEVACOR Take 1 tablet (20 mg total) by mouth every  evening.   metFORMIN 1000 MG tablet Commonly known as: GLUCOPHAGE TAKE 1 TABLET BY MOUTH TWICE DAILY WITH MEALS   metoprolol tartrate 100 MG tablet Commonly known as: LOPRESSOR Take 1 tablet (100 mg total) by mouth at bedtime. What changed:  how much to take when to take this   nystatin cream Commonly known as: MYCOSTATIN Apply 1 application topically 2 (two) times daily.   traMADol-acetaminophen 37.5-325 MG tablet Commonly known as: ULTRACET Take 1 tablet by mouth every 6 (six) hours as needed for severe pain. What changed:  how much to take when to take this reasons to take this   warfarin 5 MG tablet Commonly known as: COUMADIN Take as directed. If you are unsure how to take this medication, talk to your nurse or doctor. Original instructions: Take as directed by your provider What changed:  how much  to take how to take this when to take this additional instructions   warfarin 7.5 MG tablet Commonly known as: COUMADIN Take as directed. If you are unsure how to take this medication, talk to your nurse or doctor. Original instructions: Take 1 tablet by mouth once daily What changed:  when to take this additional instructions        DISCHARGE INSTRUCTIONS:   Follow-up PMD 5 days  If you experience worsening of your admission symptoms, develop shortness of breath, life threatening emergency, suicidal or homicidal thoughts you must seek medical attention immediately by calling 911 or calling your MD immediately  if symptoms less severe.  You Must read complete instructions/literature along with all the possible adverse reactions/side effects for all the Medicines you take and that have been prescribed to you. Take any new Medicines after you have completely understood and accept all the possible adverse reactions/side effects.   Please note  You were cared for by a hospitalist during your hospital stay. If you have any questions about your discharge medications  or the care you received while you were in the hospital after you are discharged, you can call the unit and asked to speak with the hospitalist on call if the hospitalist that took care of you is not available. Once you are discharged, your primary care physician will handle any further medical issues. Please note that NO REFILLS for any discharge medications will be authorized once you are discharged, as it is imperative that you return to your primary care physician (or establish a relationship with a primary care physician if you do not have one) for your aftercare needs so that they can reassess your need for medications and monitor your lab values.    Today   CHIEF COMPLAINT:   Chief Complaint  Patient presents with  . Diarrhea    HISTORY OF PRESENT ILLNESS:  Izaia Say  is a 69 y.o. male came in with diarrhea   VITAL SIGNS:  Blood pressure (!) 119/52, pulse 86, temperature 98.4 F (36.9 C), temperature source Oral, resp. rate 18, height 5' 8"  (1.727 m), weight (!) 141.5 kg, SpO2 97 %.  I/O:    Intake/Output Summary (Last 24 hours) at 08/06/2019 1703 Last data filed at 08/06/2019 1019 Gross per 24 hour  Intake 480 ml  Output 0 ml  Net 480 ml    PHYSICAL EXAMINATION:  GENERAL:  69 y.o.-year-old patient lying in the bed with no acute distress.  EYES: Pupils equal, round, reactive to light and accommodation. No scleral icterus. Extraocular muscles intact.  HEENT: Head atraumatic, normocephalic. Oropharynx and nasopharynx clear. .  LUNGS: Normal breath sounds bilaterally, no wheezing, rales,rhonchi or crepitation. No use of accessory muscles of respiration.  CARDIOVASCULAR: S1, S2 irregularly irregular tachycardic this morning but better upon discharge. No murmurs, rubs, or gallops.  ABDOMEN: Soft, non-tender, non-distended. Bowel sounds present. No organomegaly or mass.  EXTREMITIES: No pedal edema.  NEUROLOGIC: Cranial nerves II through XII are intact. Muscle strength 5/5  in all extremities.  PSYCHIATRIC: The patient is alert and oriented x 3.  SKIN: Area of fullness on left buttock area with scabs.  Slight erythema underneath.  DATA REVIEW:   CBC Recent Labs  Lab 08/05/19 0738  WBC 6.5  HGB 10.0*  HCT 32.3*  PLT 253    Chemistries  Recent Labs  Lab 08/05/19 0738 08/05/19 0738 08/06/19 0927  NA 137   < > 136  K 3.4*   < > 3.9  CL 102   < > 102  CO2 25   < > 24  GLUCOSE 153*   < > 208*  BUN 12   < > 13  CREATININE 1.39*   < > 1.46*  CALCIUM 9.0   < > 8.8*  AST 79*  --   --   ALT 28  --   --   ALKPHOS 59  --   --   BILITOT 1.3*  --   --    < > = values in this interval not displayed.     Microbiology Results  Results for orders placed or performed during the hospital encounter of 08/03/19  Gastrointestinal Panel by PCR , Stool     Status: None   Collection Time: 08/03/19 11:55 PM   Specimen: STOOL  Result Value Ref Range Status   Campylobacter species NOT DETECTED NOT DETECTED Final   Plesimonas shigelloides NOT DETECTED NOT DETECTED Final   Salmonella species NOT DETECTED NOT DETECTED Final   Yersinia enterocolitica NOT DETECTED NOT DETECTED Final   Vibrio species NOT DETECTED NOT DETECTED Final   Vibrio cholerae NOT DETECTED NOT DETECTED Final   Enteroaggregative E coli (EAEC) NOT DETECTED NOT DETECTED Final   Enteropathogenic E coli (EPEC) NOT DETECTED NOT DETECTED Final   Enterotoxigenic E coli (ETEC) NOT DETECTED NOT DETECTED Final   Shiga like toxin producing E coli (STEC) NOT DETECTED NOT DETECTED Final   Shigella/Enteroinvasive E coli (EIEC) NOT DETECTED NOT DETECTED Final   Cryptosporidium NOT DETECTED NOT DETECTED Final   Cyclospora cayetanensis NOT DETECTED NOT DETECTED Final   Entamoeba histolytica NOT DETECTED NOT DETECTED Final   Giardia lamblia NOT DETECTED NOT DETECTED Final   Adenovirus F40/41 NOT DETECTED NOT DETECTED Final   Astrovirus NOT DETECTED NOT DETECTED Final   Norovirus GI/GII NOT DETECTED NOT  DETECTED Final   Rotavirus A NOT DETECTED NOT DETECTED Final   Sapovirus (I, II, IV, and V) NOT DETECTED NOT DETECTED Final    Comment: Performed at [facility], [address] [place], [place] [address]  C Difficile Quick Screen w PCR reflex     Status: None   Collection Time: 08/03/19 11:55 PM   Specimen: STOOL  Result Value Ref Range Status   C Diff antigen NEGATIVE NEGATIVE Final   C Diff toxin NEGATIVE NEGATIVE Final   C Diff interpretation No C. difficile detected.  Final    Comment: Performed at [facility], [address] [place], [place] [address]  SARS Coronavirus 2 by RT PCR (hospital order, performed in [facility] hospital lab) Nasopharyngeal Nasopharyngeal Swab     Status: None   Collection Time: 08/04/19  2:00 AM   Specimen: Nasopharyngeal Swab  Result Value Ref Range Status   SARS Coronavirus 2 NEGATIVE NEGATIVE Final    Comment: (NOTE) SARS-CoV-2 target nucleic acids are NOT DETECTED.  The SARS-CoV-2 RNA is generally detectable in upper and lower respiratory specimens during the acute phase of infection. The lowest concentration of SARS-CoV-2 viral copies this assay can detect is 250 copies / mL. A negative result does not preclude SARS-CoV-2 infection and should not be used as the sole basis for treatment or other patient management decisions.  A negative result may occur with improper specimen collection / handling, submission of specimen other than nasopharyngeal swab, presence of viral mutation(s) within the areas targeted by this assay, and inadequate number of viral copies (<250 copies / mL). A negative result must be combined with clinical observations,  patient history, and epidemiological information.  Fact Sheet for Patients:   StrictlyIdeas.no  Fact Sheet for Healthcare Providers: BankingDealers.co.za  This test is not yet approved or  cleared by the [place] FDA and has been  authorized for detection and/or diagnosis of SARS-CoV-2 by FDA under an Emergency Use Authorization (EUA).  This EUA will remain in effect (meaning this test can be used) for the duration of the COVID-19 declaration under Section 564(b)(1) of the Act, 21 U.S.C. section 360bbb-3(b)(1), unless the authorization is terminated or revoked sooner.  Performed at [facility], [address] [place], [place] [address]   Blood culture (routine x 2)     Status: None (Preliminary result)   Collection Time: 08/04/19  3:01 AM   Specimen: BLOOD LEFT FOREARM  Result Value Ref Range Status   Specimen Description BLOOD LEFT FOREARM  Final   Special Requests   Final    BOTTLES DRAWN AEROBIC AND ANAEROBIC Blood Culture adequate volume   Culture   Final    NO GROWTH 2 DAYS Performed at [facility], [address], [place], [place] [address]    Report Status PENDING  Incomplete  Blood culture (routine x 2)     Status: None (Preliminary result)   Collection Time: 08/04/19  3:01 AM   Specimen: BLOOD RIGHT FOREARM  Result Value Ref Range Status   Specimen Description BLOOD RIGHT FOREARM  Final   Special Requests   Final    BOTTLES DRAWN AEROBIC AND ANAEROBIC Blood Culture adequate volume   Culture   Final    NO GROWTH 2 DAYS Performed at [facility], [address] [place], [place] [address]    Report Status PENDING  Incomplete    RADIOLOGY:  ECHOCARDIOGRAM COMPLETE  Result Date: 08/05/2019    ECHOCARDIOGRAM REPORT   Patient Name:   LYAL HUSTED Date of Exam: 08/05/2019 Medical Rec #:  [id number]       Height:       68.0 in Accession #:    [id number]      Weight:       311.3 lb Date of Birth:  09-22-1950      BSA:          2.467 m Patient Age:    77 years        BP:           126/69 mmHg Patient Gender: M               HR:           87 bpm. Exam Location:  [facility] Procedure: 2D Echo, Cardiac Doppler and Color Doppler Indications:     CHF 428.0  History:         Patient  has no prior history of Echocardiogram examinations.                  Pacemaker, TIA, Arrythmias:Atrial Fibrillation; Risk                  Factors:Diabetes and Hypertension.  Sonographer:     Sherrie Sport RDCS (AE) Referring Phys:  [id number] [facility] [place] Diagnosing Phys: Ida Rogue MD  Sonographer Comments: Technically challenging study due to limited acoustic windows, no apical window and no subcostal window. IMPRESSIONS  1. Left ventricular ejection fraction, by estimation, is 60 to 65%. The left ventricle has normal function. The left ventricle has no regional wall motion abnormalities. Left ventricular diastolic parameters are indeterminate.  2. Right  ventricular systolic function is normal. The right ventricular size is normal. There is normal pulmonary artery systolic pressure.  3. Left atrial size was mildly dilated. FINDINGS  Left Ventricle: Left ventricular ejection fraction, by estimation, is 60 to 65%. The left ventricle has normal function. The left ventricle has no regional wall motion abnormalities. The left ventricular internal cavity size was normal in size. There is  no left ventricular hypertrophy. Left ventricular diastolic parameters are indeterminate. Right Ventricle: The right ventricular size is normal. No increase in right ventricular wall thickness. Right ventricular systolic function is normal. There is normal pulmonary artery systolic pressure. The tricuspid regurgitant velocity is 2.14 m/s, and  with an assumed right atrial pressure of 10 mmHg, the estimated right ventricular systolic pressure is 67.5 mmHg. Left Atrium: Left atrial size was mildly dilated. Right Atrium: Right atrial size was normal in size. Pericardium: There is no evidence of pericardial effusion. Mitral Valve: The mitral valve is normal in structure. Normal mobility of the mitral valve leaflets. No evidence of mitral valve regurgitation. No evidence of mitral valve stenosis. Tricuspid Valve: The tricuspid valve is  normal in structure. Tricuspid valve regurgitation is trivial. No evidence of tricuspid stenosis. Aortic Valve: The aortic valve is normal in structure. Aortic valve regurgitation is not visualized. Mild to moderate aortic valve sclerosis/calcification is present, without any evidence of aortic stenosis. Pulmonic Valve: The pulmonic valve was normal in structure. Pulmonic valve regurgitation is not visualized. No evidence of pulmonic stenosis. Aorta: The aortic root is normal in size and structure. Venous: The inferior vena cava is normal in size with greater than 50% respiratory variability, suggesting right atrial pressure of 3 mmHg. IAS/Shunts: No atrial level shunt detected by color flow Doppler.  LEFT VENTRICLE PLAX 2D LVIDd:         4.85 cm LVIDs:         3.23 cm LV PW:         0.93 cm LV IVS:        1.58 cm LVOT diam:     2.10 cm LVOT Area:     3.46 cm  LEFT ATRIUM         Index LA diam:    5.10 cm 2.07 cm/m                        PULMONIC VALVE AORTA                 PV Vmax:        0.60 m/s Ao Root diam: 3.30 cm PV Peak grad:   1.4 mmHg                       RVOT Peak grad: 2 mmHg  TRICUSPID VALVE TR Peak grad:   18.3 mmHg TR Vmax:        214.00 cm/s  SHUNTS Systemic Diam: 2.10 cm Ida Rogue MD Electronically signed by Ida Rogue MD Signature Date/Time: 08/05/2019/11:19:26 AM    Final       Management plans discussed with the patient, and he is in agreement.  CODE STATUS:     Code Status Orders  (From admission, onward)         Start     Ordered   08/04/19 0314  Full code  Continuous        08/04/19 0315        Code Status History    Date Active Date Inactive  Code Status Order ID Comments User Context   04/30/2018 0043 05/02/2018 1948 Full Code [id number]  Lance Coon, MD Inpatient   03/30/2017 0924 04/10/2017 1828 Full Code [id number]  Carloyn Manner, MD Inpatient   10/22/2016 1340 10/24/2016 1709 Full Code [id number]  Gladstone Lighter, MD Inpatient   09/16/2015 0640 09/19/2015  1644 Full Code [id number]  Donnie Mesa, MD Inpatient   11/11/2014 2256 11/12/2014 1510 Full Code [id number]  Lance Coon, MD Inpatient   10/25/2014 0923 10/26/2014 0328 Full Code [id number]  Sabino Dick, MD HOV   08/07/2014 1807 08/09/2014 1927 Full Code [id number]  Epifanio Lesches, MD ED   Advance Care Planning Activity      TOTAL TIME TAKING CARE OF THIS PATIENT: 35 minutes.    Loletha Grayer M.D on 08/06/2019 at 5:03 PM  Between 7am to 6pm - Pager - [phone]  After 6pm go to [email or web address] - password [email or web address]  [facility]  CC: Primary care physician; Birdie Sons, MD

## 2019-08-06 NOTE — Discharge Instructions (Signed)
If diarrhea starts again after restarting metformin then hold metformin and may have to restart at lower dose  Diarrhea, Adult Diarrhea is frequent loose and watery bowel movements. Diarrhea can make you feel weak and cause you to become dehydrated. Dehydration can make you tired and thirsty, cause you to have a dry mouth, and decrease how often you urinate. Diarrhea typically lasts 2-3 days. However, it can last longer if it is a sign of something more serious. It is important to treat your diarrhea as told by your health care provider. Follow these instructions at home: Eating and drinking     Follow these recommendations as told by your health care provider:  Take an oral rehydration solution (ORS). This is an over-the-counter medicine that helps return your body to its normal balance of nutrients and water. It is found at pharmacies and retail stores.  Drink plenty of fluids, such as water, ice chips, diluted fruit juice, and low-calorie sports drinks. You can drink milk also, if desired.  Avoid drinking fluids that contain a lot of sugar or caffeine, such as energy drinks, sports drinks, and soda.  Eat bland, easy-to-digest foods in small amounts as you are able. These foods include bananas, applesauce, rice, lean meats, toast, and crackers.  Avoid alcohol.  Avoid spicy or fatty foods.  Medicines  Take over-the-counter and prescription medicines only as told by your health care provider.  If you were prescribed an antibiotic medicine, take it as told by your health care provider. Do not stop using the antibiotic even if you start to feel better. General instructions   Wash your hands often using soap and water. If soap and water are not available, use a hand sanitizer. Others in the household should wash their hands as well. Hands should be washed: ? After using the toilet or changing a diaper. ? Before preparing, cooking, or serving food. ? While caring for a sick person or  while visiting someone in a hospital.  Drink enough fluid to keep your urine pale yellow.  Rest at home while you recover.  Watch your condition for any changes.  Take a warm bath to relieve any burning or pain from frequent diarrhea episodes.  Keep all follow-up visits as told by your health care provider. This is important. Contact a health care provider if:  You have a fever.  Your diarrhea gets worse.  You have new symptoms.  You cannot keep fluids down.  You feel light-headed or dizzy.  You have a headache.  You have muscle cramps. Get help right away if:  You have chest pain.  You feel extremely weak or you faint.  You have bloody or black stools or stools that look like tar.  You have severe pain, cramping, or bloating in your abdomen.  You have trouble breathing or you are breathing very quickly.  Your heart is beating very quickly.  Your skin feels cold and clammy.  You feel confused.  You have signs of dehydration, such as: ? Dark urine, very little urine, or no urine. ? Cracked lips. ? Dry mouth. ? Sunken eyes. ? Sleepiness. ? Weakness. Summary  Diarrhea is frequent loose and watery bowel movements. Diarrhea can make you feel weak and cause you to become dehydrated.  Drink enough fluids to keep your urine pale yellow.  Make sure that you wash your hands after using the toilet. If soap and water are not available, use hand sanitizer.  Contact a health care provider if your diarrhea  gets worse or you have new symptoms.  Get help right away if you have signs of dehydration. This information is not intended to replace advice given to you by your health care provider. Make sure you discuss any questions you have with your health care provider. Document Revised: 06/01/2018 Document Reviewed: 06/19/2017 Elsevier Patient Education  [place].

## 2019-08-06 NOTE — Consult Note (Signed)
ANTICOAGULATION CONSULT NOTE   Pharmacy Consult for Warfarin  Indication: atrial fibrillation  Patient Measurements: Height: 5' 8"  (172.7 cm) Weight: (!) 141.5 kg (311 lb 14.4 oz) IBW/kg (Calculated) : 68.4  Vital Signs: Temp: 98.4 F (36.9 C) (07/10 0754) Temp Source: Oral (07/10 0754) BP: 103/49 (07/10 0754) Pulse Rate: 85 (07/10 0754)  Labs: Recent Labs    08/03/19 1720 08/03/19 1720 08/03/19 2355 08/04/19 1136 08/05/19 0738 08/05/19 0853  HGB 9.7*   < >  --  8.7* 10.0*  --   HCT 31.4*  --   --  27.7* 32.3*  --   PLT 242  --   --  201 253  --   LABPROT  --   --  26.6* 28.3*  --  30.5*  INR  --   --  2.6* 2.8*  --  3.0*  CREATININE 1.32*  --   --  1.28* 1.39*  --    < > = values in this interval not displayed.    Estimated Creatinine Clearance: 68.3 mL/min (A) (by C-G formula based on SCr of 1.39 mg/dL (H)).   Medical History: Past Medical History:  Diagnosis Date  . A-fib ([place])   . Carotid stenosis   . CHF (congestive heart failure) ([place])   . Degenerative disc disease, lumbar    s/p injury  . Diabetes mellitus without complication ([place])   . Disseminated superficial actinic porokeratosis   . Dysrhythmia    A-FIB, palpatations  . 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 Fatty liver   . History of degenerative disc disease   . History of kidney stones   . Hypercholesteremia   . Hypertension   . Kidney stones   . Obesity   . Pacemaker    inactive  . Peripheral vascular disease ([place])    Carotid stenosis  . Presence of permanent cardiac pacemaker    Inactive  . TIA (transient ischemic attack) 2011   No deficits  . TIA (transient ischemic attack)   . Vitamin B12 deficiency     Assessment: Pharmacy consulted for warfarin dosing and monitoring for 69 yo male with PMH of A. Fib, H&H, platelets stable  DDI: asa 81 (no new since previous note)  Home Regimen: Warfarin 3m MWF                               Warfarin 7.531mT,TH, Sat, Sun    DATE   INR   DOSE 7/7   2.6  7/8   2.8   6 mg 7/9   3   2.5 mg 7/10   3.3   Hold  Goal of Therapy:  INR 2-3 Monitor platelets by anticoagulation protocol: Yes   Plan:   INR is supra-therapeutic and trending up  Hold warfarin today   Daily INR ordered  CBC at least every 3 days.    RoDallie PilesPharmD, BCPS Clinical Pharmacist 08/06/2019 9:27 AM

## 2019-08-08 ENCOUNTER — Telehealth: Payer: Self-pay

## 2019-08-08 ENCOUNTER — Other Ambulatory Visit: Payer: Self-pay

## 2019-08-08 ENCOUNTER — Emergency Department: Payer: Medicare HMO

## 2019-08-08 ENCOUNTER — Emergency Department
Admission: EM | Admit: 2019-08-08 | Discharge: 2019-08-08 | Disposition: A | Discharge status: Home / Self Care | Payer: Medicare HMO | Attending: Emergency Medicine | Admitting: Emergency Medicine

## 2019-08-08 DIAGNOSIS — R0789 Other chest pain: Secondary | ICD-10-CM | POA: Diagnosis not present

## 2019-08-08 DIAGNOSIS — I5033 Acute on chronic diastolic (congestive) heart failure: Secondary | ICD-10-CM | POA: Insufficient documentation

## 2019-08-08 DIAGNOSIS — Z79899 Other long term (current) drug therapy: Secondary | ICD-10-CM | POA: Insufficient documentation

## 2019-08-08 DIAGNOSIS — N289 Disorder of kidney and ureter, unspecified: Secondary | ICD-10-CM | POA: Insufficient documentation

## 2019-08-08 DIAGNOSIS — R519 Headache, unspecified: Secondary | ICD-10-CM | POA: Diagnosis not present

## 2019-08-08 DIAGNOSIS — S79912A Unspecified injury of left hip, initial encounter: Secondary | ICD-10-CM | POA: Diagnosis not present

## 2019-08-08 DIAGNOSIS — E860 Dehydration: Secondary | ICD-10-CM | POA: Insufficient documentation

## 2019-08-08 DIAGNOSIS — I4891 Unspecified atrial fibrillation: Secondary | ICD-10-CM | POA: Diagnosis not present

## 2019-08-08 DIAGNOSIS — I4821 Permanent atrial fibrillation: Secondary | ICD-10-CM

## 2019-08-08 DIAGNOSIS — Z955 Presence of coronary angioplasty implant and graft: Secondary | ICD-10-CM | POA: Insufficient documentation

## 2019-08-08 DIAGNOSIS — N183 Chronic kidney disease, stage 3 unspecified: Secondary | ICD-10-CM | POA: Diagnosis not present

## 2019-08-08 DIAGNOSIS — R52 Pain, unspecified: Secondary | ICD-10-CM | POA: Diagnosis not present

## 2019-08-08 DIAGNOSIS — G9389 Other specified disorders of brain: Secondary | ICD-10-CM | POA: Diagnosis not present

## 2019-08-08 DIAGNOSIS — R600 Localized edema: Secondary | ICD-10-CM | POA: Diagnosis not present

## 2019-08-08 DIAGNOSIS — I130 Hypertensive heart and chronic kidney disease with heart failure and stage 1 through stage 4 chronic kidney disease, or unspecified chronic kidney disease: Secondary | ICD-10-CM | POA: Insufficient documentation

## 2019-08-08 DIAGNOSIS — Z6841 Body Mass Index (BMI) 40.0 and over, adult: Secondary | ICD-10-CM | POA: Diagnosis not present

## 2019-08-08 DIAGNOSIS — I517 Cardiomegaly: Secondary | ICD-10-CM | POA: Diagnosis not present

## 2019-08-08 DIAGNOSIS — S069X9A Unspecified intracranial injury with loss of consciousness of unspecified duration, initial encounter: Secondary | ICD-10-CM | POA: Diagnosis not present

## 2019-08-08 DIAGNOSIS — J45909 Unspecified asthma, uncomplicated: Secondary | ICD-10-CM | POA: Diagnosis not present

## 2019-08-08 DIAGNOSIS — Z7984 Long term (current) use of oral hypoglycemic drugs: Secondary | ICD-10-CM | POA: Insufficient documentation

## 2019-08-08 DIAGNOSIS — E119 Type 2 diabetes mellitus without complications: Secondary | ICD-10-CM

## 2019-08-08 DIAGNOSIS — Z7951 Long term (current) use of inhaled steroids: Secondary | ICD-10-CM | POA: Insufficient documentation

## 2019-08-08 DIAGNOSIS — I639 Cerebral infarction, unspecified: Secondary | ICD-10-CM | POA: Diagnosis not present

## 2019-08-08 DIAGNOSIS — R55 Syncope and collapse: Secondary | ICD-10-CM | POA: Diagnosis not present

## 2019-08-08 DIAGNOSIS — Z7901 Long term (current) use of anticoagulants: Secondary | ICD-10-CM | POA: Insufficient documentation

## 2019-08-08 DIAGNOSIS — R0781 Pleurodynia: Secondary | ICD-10-CM | POA: Diagnosis not present

## 2019-08-08 DIAGNOSIS — R42 Dizziness and giddiness: Secondary | ICD-10-CM | POA: Diagnosis not present

## 2019-08-08 DIAGNOSIS — E1122 Type 2 diabetes mellitus with diabetic chronic kidney disease: Secondary | ICD-10-CM | POA: Diagnosis not present

## 2019-08-08 DIAGNOSIS — R0902 Hypoxemia: Secondary | ICD-10-CM | POA: Diagnosis not present

## 2019-08-08 DIAGNOSIS — N189 Chronic kidney disease, unspecified: Secondary | ICD-10-CM

## 2019-08-08 LAB — PROTIME-INR
INR: 3 — ABNORMAL HIGH (ref 0.8–1.2)
Prothrombin Time: 29.9 seconds — ABNORMAL HIGH (ref 11.4–15.2)

## 2019-08-08 LAB — CBC WITH DIFFERENTIAL/PLATELET
Abs Immature Granulocytes: 0.02 10*3/uL (ref 0.00–0.07)
Basophils Absolute: 0 10*3/uL (ref 0.0–0.1)
Basophils Relative: 1 %
Eosinophils Absolute: 0.2 10*3/uL (ref 0.0–0.5)
Eosinophils Relative: 4 %
HCT: 31.4 % — ABNORMAL LOW (ref 39.0–52.0)
Hemoglobin: 9.4 g/dL — ABNORMAL LOW (ref 13.0–17.0)
Immature Granulocytes: 0 %
Lymphocytes Relative: 17 %
Lymphs Abs: 1 10*3/uL (ref 0.7–4.0)
MCH: 28.7 pg (ref 26.0–34.0)
MCHC: 29.9 g/dL — ABNORMAL LOW (ref 30.0–36.0)
MCV: 95.7 fL (ref 80.0–100.0)
Monocytes Absolute: 0.8 10*3/uL (ref 0.1–1.0)
Monocytes Relative: 14 %
Neutro Abs: 3.7 10*3/uL (ref 1.7–7.7)
Neutrophils Relative %: 64 %
Platelets: 237 10*3/uL (ref 150–400)
RBC: 3.28 MIL/uL — ABNORMAL LOW (ref 4.22–5.81)
RDW: 17 % — ABNORMAL HIGH (ref 11.5–15.5)
WBC: 5.8 10*3/uL (ref 4.0–10.5)
nRBC: 0 % (ref 0.0–0.2)

## 2019-08-08 LAB — COMPREHENSIVE METABOLIC PANEL
ALT: 27 U/L (ref 0–44)
AST: 47 U/L — ABNORMAL HIGH (ref 15–41)
Albumin: 3.1 g/dL — ABNORMAL LOW (ref 3.5–5.0)
Alkaline Phosphatase: 86 U/L (ref 38–126)
Anion gap: 12 (ref 5–15)
BUN: 21 mg/dL (ref 8–23)
CO2: 20 mmol/L — ABNORMAL LOW (ref 22–32)
Calcium: 8.5 mg/dL — ABNORMAL LOW (ref 8.9–10.3)
Chloride: 101 mmol/L (ref 98–111)
Creatinine, Ser: 2.29 mg/dL — ABNORMAL HIGH (ref 0.61–1.24)
GFR calc Af Amer: 32 mL/min — ABNORMAL LOW (ref >60)
GFR calc non Af Amer: 28 mL/min — ABNORMAL LOW (ref >60)
Glucose, Bld: 197 mg/dL — ABNORMAL HIGH (ref 70–99)
Potassium: 4.3 mmol/L (ref 3.5–5.1)
Sodium: 133 mmol/L — ABNORMAL LOW (ref 135–145)
Total Bilirubin: 1.1 mg/dL (ref 0.3–1.2)
Total Protein: 6.9 g/dL (ref 6.5–8.1)

## 2019-08-08 MED ORDER — SODIUM CHLORIDE 0.9 % IV BOLUS
500.0000 mL | Freq: Once | INTRAVENOUS | Status: AC
  Administered 2019-08-08: 500 mL via INTRAVENOUS

## 2019-08-08 NOTE — Discharge Instructions (Addendum)
Skip tomorrow's dose of your Lasix (furosemide) medication. Today's results show that this medicine has caused you to become overly dehydrated today, resulting in dizziness and passing out.

## 2019-08-08 NOTE — ED Triage Notes (Addendum)
Pt to ED via [facility] from home. Per EMS pt was trying to get up to use the bathroom when he fell. Pt stating he hit his head and + LOC. Initial BP 96/64 in route. Pt A&Ox4. GCS 15. Pt c/o left hip pain.   Pt recently hospitalized and released on warfarin. Pt started lasix 3wks ago. Pt hx afib, DM, CHF. Upon arrival pt with notable swelling bilaterally in arms and legs.

## 2019-08-08 NOTE — Telephone Encounter (Signed)
Transition Care Management Follow-up Telephone Call  Date of discharge and from where: 08/06/19 [facility]  How have you been since you were released from the hospital? Pt states he was discharged on Saturday and fell that night having to call EMS to assist in getting up. Pt fell due to blacking out and feels like he may have a cracked rib. Pt states this has not happened in the last 2 years.   Any questions or concerns? No   Items Reviewed:  Did the pt receive and understand the discharge instructions provided? Yes   Medications obtained and verified? Yes   Any new allergies since your discharge? No   Dietary orders reviewed? Yes  Do you have support at home? Yes   Functional Questionnaire: (I = Independent and D = Dependent) ADLs: I  Bathing/Dressing- I  Meal Prep- I  Eating- I  Maintaining continence- I  Transferring/Ambulation- I  Managing Meds- I  Follow up appointments reviewed:   PCP Hospital f/u appt confirmed? No  Pt scheduled to see Dr. Caryn Section on 09/05/19 for 3 month follow up which is outside 14 day TCM window. Message sent to Dr. Caryn Section to advised regarding earlier appt.   Are transportation arrangements needed? No   If their condition worsens, is the pt aware to call PCP or go to the Emergency Dept.? Yes  Was the patient provided with contact information for the PCP's office or ED? Yes  Was to pt encouraged to call back with questions or concerns? Yes

## 2019-08-08 NOTE — ED Provider Notes (Signed)
[facility] Emergency Department Provider Note  ____________________________________________  Time seen: Approximately 9:36 PM  I have reviewed the triage vital signs and the nursing notes.   HISTORY  Chief Complaint Fall    HPI Christian Berg is a 69 y.o. male with a history of atrial fibrillation, CHF, diabetes, CKD, hypertension who comes to the ED from home due to syncope. Patient reports being in his usual state of health, when he had an urge to urinate so he got up out of bed quickly to go to the bathroom. With the quick change in position, he immediately started to feel lightheaded with vision darkening, and reports that he made it about 4 steps before he passed out.   Patient reports no chest pain or shortness of breath prior to that. Has some posterior headache from the fall, denies neck pain. He is on Coumadin for atrial fibrillation.  He takes Lasix daily, which was started 3 weeks ago for CHF.  Reviewed electronic medical record, patient had a TTE 3 days ago which showed a normal ejection fraction, overall normal study except for possible mild left ventricular diastolic dysfunction.  Denies any exertional symptoms.  No vision changes or lateralizing weakness or paresthesia.  No palpitations.     Past Medical History:  Diagnosis Date  . A-fib ([place])   . Carotid stenosis   . CHF (congestive heart failure) ([place])   . Degenerative disc disease, lumbar    s/p injury  . Diabetes mellitus without complication ([place])   . Disseminated superficial actinic porokeratosis   . Dysrhythmia    A-FIB, palpatations  . 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 Fatty liver   . History of degenerative disc disease   . History of kidney stones   . Hypercholesteremia   . Hypertension   . Kidney stones   . Obesity   . Pacemaker    inactive  . Peripheral vascular disease ([place])    Carotid stenosis  . Presence of permanent cardiac pacemaker    Inactive  . TIA (transient  ischemic attack) 2011   No deficits  . TIA (transient ischemic attack)   . Vitamin B12 deficiency      Patient Active Problem List   Diagnosis Date Noted  . Acquired thrombophilia ([place])   . Chronic low back pain   . Diarrhea 08/04/2019  . Chronic anticoagulation 08/04/2019  . CKD stage 3 due to type 2 diabetes mellitus ([place]) 08/04/2019  . Intractable diarrhea 08/04/2019  . Atrial fibrillation with RVR ([place]) 08/04/2019  . Iron deficiency anemia 06/02/2019  . Mixed simple and mucopurulent chronic bronchitis ([place]) 05/31/2019  . Moderate tricuspid regurgitation by prior echocardiogram 12/16/2018  . NASH (nonalcoholic steatohepatitis) 10/22/2015  . History of embolic stroke 94/49/6759  . Morbid (severe) obesity due to excess calories ([place]) 11/22/2014  . VFD (visual field defect) 11/22/2014  . History of TIA (transient ischemic attack) 10/31/2014  . Kidney stones 07/24/2014  . Other cirrhosis of liver ([place]) 07/24/2014  . DSAP (disseminated superficial actinic porokeratosis) 07/17/2014  . Asthma 05/31/2014  . Acute on chronic diastolic CHF (congestive heart failure) ([place]) 05/31/2014  . Diabetes mellitus ([place]) 05/31/2014  . Hypercholesteremia 05/31/2014  . B12 deficiency 05/31/2014  . Carotid artery narrowing 02/15/2014  . Bilateral carotid artery stenosis 02/15/2014  . Chronic atrial fibrillation ([place]) 10/24/2013  . Benign prostatic hyperplasia with urinary obstruction 09/06/2012  . Heart disease 05/29/2009  . Benign essential HTN 09/29/2008  . Narrowing of intervertebral disc space 01/11/2007  Past Surgical History:  Procedure Laterality Date  . APPENDECTOMY  2004  . CARDIAC CATHETERIZATION    . CARDIAC PACEMAKER PLACEMENT  2005  . CATARACT EXTRACTION W/PHACO Right 06/14/2014   Procedure: CATARACT EXTRACTION PHACO AND INTRAOCULAR LENS PLACEMENT (IOC);  Surgeon: Leandrew Koyanagi, MD;  Location: [place];  Service: Ophthalmology;  Laterality: Right;  .  CATARACT EXTRACTION W/PHACO Left 10/27/2018   Procedure: CATARACT EXTRACTION PHACO AND INTRAOCULAR LENS PLACEMENT (IOC) LEFT DIABETIC  00:46.4  18.6%  8.63;  Surgeon: Leandrew Koyanagi, MD;  Location: [place];  Service: Ophthalmology;  Laterality: Left;  Diabetic - oral meds  . CYSTOSCOPY W/ RETROGRADES Bilateral 08/09/2014   Procedure: CYSTOSCOPY WITH RETROGRADE PYELOGRAM;  Surgeon: Hollice Espy, MD;  Location: [facility];  Service: Urology;  Laterality: Bilateral;  . INTUBATION-ENDOTRACHEAL WITH TRACHEOSTOMY STANDBY  03/30/2017   Procedure: INTUBATION-ENDOTRACHEAL WITH TRACHEOSTOMY STANDBY;  Surgeon: Carloyn Manner, MD;  Location: [facility];  Service: ENT;;     Prior to Admission medications   Medication Sig Start Date End Date Taking? Authorizing Provider  albuterol (VENTOLIN HFA) 108 (90 Base) MCG/ACT inhaler Inhale 2 puffs into the lungs every 6 (six) hours as needed for wheezing or shortness of breath. 05/31/19   Birdie Sons, MD  aspirin EC 81 MG tablet Take 81 mg by mouth every evening.     [provider]  diltiazem (CARDIZEM CD) 120 MG 24 hr capsule Take 1 capsule (120 mg total) by mouth daily. 08/07/19 08/06/20  Loletha Grayer, MD  doxycycline (VIBRA-TABS) 100 MG tablet Take 1 tablet (100 mg total) by mouth every 12 (twelve) hours. 08/06/19   Loletha Grayer, MD  empagliflozin (JARDIANCE) 10 MG TABS tablet Take 10 mg by mouth daily before breakfast. 06/06/19   Birdie Sons, MD  ferrous sulfate 325 (65 FE) MG tablet Take 325 mg by mouth daily with breakfast.    [provider]  furosemide (LASIX) 20 MG tablet Take 1 tablet by mouth once daily Patient taking differently: Take 20 mg by mouth daily.  06/18/19   Birdie Sons, MD  glipiZIDE (GLUCOTROL) 10 MG tablet TAKE 1 TABLET BY MOUTH TWICE DAILY BEFORE MEAL(S) Patient taking differently: Take 10 mg by mouth 2 (two) times daily before a meal.  05/15/19   Fisher, Kirstie Peri, MD  ketoconazole (NIZORAL) 2  % cream Apply 1 application topically daily as needed for irritation. 08/06/19   Loletha Grayer, MD  lisinopril (ZESTRIL) 20 MG tablet Take 1 tablet (20 mg total) by mouth daily. 04/07/19   Birdie Sons, MD  lovastatin (MEVACOR) 20 MG tablet Take 1 tablet (20 mg total) by mouth every evening. 04/08/19   Birdie Sons, MD  metFORMIN (GLUCOPHAGE) 1000 MG tablet TAKE 1 TABLET BY MOUTH TWICE DAILY WITH MEALS Patient taking differently: Take 1,000 mg by mouth 2 (two) times daily with a meal.  11/16/18   Birdie Sons, MD  metoprolol tartrate (LOPRESSOR) 100 MG tablet Take 1 tablet (100 mg total) by mouth at bedtime. Patient taking differently: Take 100 mg by mouth 2 (two) times daily.  10/24/16   Demetrios Loll, MD  nystatin cream (MYCOSTATIN) Apply 1 application topically 2 (two) times daily. 07/17/14   Zara Council A, PA-C  traMADol-acetaminophen (ULTRACET) 37.5-325 MG tablet Take 1 tablet by mouth every 6 (six) hours as needed for severe pain. 08/06/19   Loletha Grayer, MD  warfarin (COUMADIN) 5 MG tablet Take as directed by your provider Patient taking differently: Take 5 mg  by mouth 3 (three) times a week. TUESDAY/THURSDAY/SATURDAY/SUNDAY 03/09/19   Birdie Sons, MD  warfarin (COUMADIN) 7.5 MG tablet Take 1 tablet by mouth once daily Patient taking differently: Take 7.5 mg by mouth 2 (two) times a week. Monday/Wednesday/Friday 04/07/19   Birdie Sons, MD     Allergies Clindamycin/lincomycin, Sulfa antibiotics, and Sulfasalazine   Family History  Adopted: Yes  Problem Relation Age of Onset  . Heart disease Mother   . Fibromyalgia Sister   . Hypertension Daughter   . Migraines Daughter   . Kidney disease Neg Hx   . Prostate cancer Neg Hx   . Bladder Cancer Neg Hx     Social History Social History   Tobacco Use  . Smoking status: Never Smoker  . Smokeless tobacco: Never Used  Vaping Use  . Vaping Use: Never used  Substance Use Topics  . Alcohol use: No     Alcohol/week: 0.0 standard drinks  . Drug use: No    Review of Systems  Constitutional:   No fever or chills.  ENT:   No sore throat. No rhinorrhea. Cardiovascular:   No chest pain positive syncope. Respiratory:   No dyspnea or cough. Gastrointestinal:   Negative for abdominal pain, vomiting and diarrhea.  Musculoskeletal:   Negative for focal pain or swelling All other systems reviewed and are negative except as documented above in ROS and HPI.  ____________________________________________   PHYSICAL EXAM:  VITAL SIGNS: ED Triage Vitals  Enc Vitals Group     BP 08/08/19 1945 (!) 98/58     Pulse Rate 08/08/19 1946 61     Resp 08/08/19 1945 18     Temp 08/08/19 1954 97.7 F (36.5 C)     Temp Source 08/08/19 1954 Oral     SpO2 08/08/19 1946 97 %     Weight 08/08/19 1955 (!) 311 lb 14.4 oz (141.5 kg)     Height 08/08/19 1955 5' 8"  (1.727 m)     Head Circumference --      Peak Flow --      Pain Score 08/08/19 2106 4     Pain Loc --      Pain Edu? --      Excl. in [place]? --     Vital signs reviewed, nursing assessments reviewed.   Constitutional:   Alert and oriented. Non-toxic appearance. Eyes:   Conjunctivae are normal. EOMI. PERRL. ENT      Head:   Normocephalic with occipital scalp contusion      Nose:   Wearing a mask.      Mouth/Throat:   Wearing a mask.      Neck:   No meningismus. Full ROM.  No midline spinal tenderness. Hematological/Lymphatic/Immunilogical:   No cervical lymphadenopathy. Cardiovascular:   RRR. Symmetric bilateral radial and DP pulses.  No murmurs. Cap refill less than 2 seconds. Respiratory:   Normal respiratory effort without tachypnea/retractions. Breath sounds are clear and equal bilaterally. No wheezes/rales/rhonchi. Gastrointestinal:   Soft and nontender. Non distended. There is no CVA tenderness.  No rebound, rigidity, or guarding.  Musculoskeletal:   Normal range of motion in all extremities. No joint effusions.  There is right chest  wall tenderness, but chest wall is stable.  No deformity.  Bucket-handle test is negative for the right ribs.  Mild left hip tenderness, but no pain with passive flexion of the left hip.  Pelvis is stable.  There is 3+ pitting edema bilateral lower extremities, right greater than left.  Neurologic:   Normal speech and language.  Motor grossly intact. No acute focal neurologic deficits are appreciated.  Skin:    Skin is warm, dry and intact.  Chronic psoriasis on all extremities no petechiae, purpura, or bullae.  ____________________________________________    LABS (pertinent positives/negatives) (all labs ordered are listed, but only abnormal results are displayed) Labs Reviewed  COMPREHENSIVE METABOLIC PANEL - Abnormal; Notable for the following components:      Result Value   Sodium 133 (*)    CO2 20 (*)    Glucose, Bld 197 (*)    Creatinine, Ser 2.29 (*)    Calcium 8.5 (*)    Albumin 3.1 (*)    AST 47 (*)    GFR calc non Af Amer 28 (*)    GFR calc Af Amer 32 (*)    All other components within normal limits  CBC WITH DIFFERENTIAL/PLATELET - Abnormal; Notable for the following components:   RBC 3.28 (*)    Hemoglobin 9.4 (*)    HCT 31.4 (*)    MCHC 29.9 (*)    RDW 17.0 (*)    All other components within normal limits  PROTIME-INR - Abnormal; Notable for the following components:   Prothrombin Time 29.9 (*)    INR 3.0 (*)    All other components within normal limits   ____________________________________________   EKG  Interpreted by me Atrial fibrillation rate of 62, normal axis.  Poor R wave progression.  Normal ST segments and T waves.  Computerized readout listed QTC of 600, which appears erroneous due to low voltages, visually the QTC is grossly normal.  ____________________________________________    RADIOLOGY  CT HEAD WO CONTRAST  Result Date: 08/08/2019 CLINICAL DATA:  Loss of consciousness and fall EXAM: CT HEAD WITHOUT CONTRAST TECHNIQUE: Contiguous  axial images were obtained from the base of the skull through the vertex without intravenous contrast. COMPARISON:  April 04, 2017 FINDINGS: Brain: No evidence of acute territorial infarction, hemorrhage, hydrocephalus,extra-axial collection or mass lesion/mass effect. There is dilatation the ventricles and sulci consistent with age-related atrophy. Low-attenuation changes in the deep white matter consistent with small vessel ischemia. Again noted is area of encephalomalacia involving the right occipital lobe. Vascular: No hyperdense vessel or unexpected calcification. Skull: The skull is intact. No fracture or focal lesion identified. Sinuses/Orbits: The visualized paranasal sinuses and mastoid air cells are clear. The orbits and globes intact. Other: None IMPRESSION: No acute intracranial abnormality. Findings consistent with age related atrophy and chronic small vessel ischemia Prior infarct within the right occipital lobe Electronically Signed   By: Prudencio Pair M.D.   On: 08/08/2019 20:38   US Venous Img Lower Unilateral Right  Result Date: 08/08/2019 CLINICAL DATA:  Bilateral lower extremity edema right greater than left EXAM: RIGHT LOWER EXTREMITY VENOUS DOPPLER ULTRASOUND TECHNIQUE: Gray-scale sonography with compression, as well as color and duplex ultrasound, were performed to evaluate the deep venous system(s) from the level of the common femoral vein through the popliteal and proximal calf veins. COMPARISON:  12/03/2018 FINDINGS: VENOUS Normal compressibility of the common femoral, superficial femoral, and popliteal veins, as well as the visualized calf veins. Visualized portions of profunda femoral vein and great saphenous vein unremarkable. No filling defects to suggest DVT on grayscale or color Doppler imaging. Doppler waveforms show normal direction of venous flow, normal respiratory plasticity and response to augmentation. Limited views of the contralateral common femoral vein are unremarkable.  OTHER None. Limitations: none IMPRESSION: Negative. Electronically Signed   By:  Randa Ngo M.D.   On: 08/08/2019 20:47   DG Chest Portable 1 View  Result Date: 08/08/2019 CLINICAL DATA:  Right chest wall pain EXAM: PORTABLE CHEST 1 VIEW COMPARISON:  July 13, 2019 FINDINGS: There is stable cardiomegaly. A left-sided pacemaker seen with the lead tips in the right atrium and right ventricle. Both lungs are clear. The visualized skeletal structures are unremarkable. IMPRESSION: No active disease. Electronically Signed   By: Prudencio Pair M.D.   On: 08/08/2019 20:27   DG Hip Unilat W or Wo Pelvis 2-3 Views Left  Result Date: 08/08/2019 CLINICAL DATA:  Right chest wall pain, left hip pain following fall EXAM: DG HIP (WITH OR WITHOUT PELVIS) 2-3V LEFT COMPARISON:  None. FINDINGS: Single view radiograph of the pelvis and two view radiograph of the left hip demonstrates normal alignment. No fracture or dislocation. Joint spaces are preserved. Soft tissues are unremarkable. IMPRESSION: Negative. Electronically Signed   By: Fidela Salisbury MD   On: 08/08/2019 20:20    ____________________________________________   PROCEDURES Procedures  ____________________________________________  DIFFERENTIAL DIAGNOSIS   Rib fracture, pulmonary edema, pneumothorax, hip fracture, dehydration, intracranial hemorrhage  CLINICAL IMPRESSION / ASSESSMENT AND PLAN / ED COURSE  Medications ordered in the ED: Medications  sodium chloride 0.9 % bolus 500 mL (has no administration in time range)    Pertinent labs & imaging results that were available during my care of the patient were reviewed by me and considered in my medical decision making (see chart for details).  Christian Berg was evaluated in Emergency Department on 08/08/2019 for the symptoms described in the history of present illness. He was evaluated in the context of the global COVID-19 pandemic, which necessitated consideration that the patient might be  at risk for infection with the SARS-CoV-2 virus that causes COVID-19. Institutional protocols and algorithms that pertain to the evaluation of patients at risk for COVID-19 are in a state of rapid change based on information released by regulatory bodies including the CDC and federal and state organizations. These policies and algorithms were followed during the patient's care in the ED.   Patient presents after orthostatic lightheadedness and syncope, suspicious for dehydration.  Will check CT head, chest x-ray, hip x-ray, ultrasound of the right lower extremity for trauma work-up and to evaluate for possible right lower extremity DVT with asymmetric edema.  Check lab panel.  Clinical Course as of Aug 08 2134  Mon Aug 08, 2019  2111 CT head is negative for intracranial hemorrhage or other traumatic injury.  Chest x-ray and hip x-ray unremarkable for fractures, pulmonary edema, pneumothorax or other acute pathology.  Ultrasound of the right leg is negative for DVT.   [PS]  2111 Labs show stable anemia, therapeutic INR on Coumadin, and mild AKI on CKD which I think is due to diuretic use as patient reports a 30 pound weight loss since recent hospitalization with aggressive diuresis.  This likely is the cause of his orthostatic symptoms as well.  He still has pronounced peripheral edema, will plan to have him hold his next dose of Lasix, follow-up with heart failure clinic tomorrow.   [PS]    Clinical Course User Index [PS] Carrie Mew, MD     ----------------------------------------- 9:55 PM on 08/08/2019 -----------------------------------------  With review of echocardiogram from 3 days ago showing normal EF, and overall pretty unremarkable study, give a 500 mL saline bolus to replace fluid losses from his Lasix to improve symptoms and prevent recurrent orthostatics and falls.  Will  be stable for discharge home afterward.  ____________________________________________   FINAL CLINICAL  IMPRESSION(S) / ED DIAGNOSES    Final diagnoses:  Syncope, unspecified syncope type  Dehydration  Acute on chronic renal insufficiency  Morbid obesity (HCC)  Permanent atrial fibrillation (HCC)  Type 2 diabetes mellitus without complication, without long-term current use of insulin [facility])     ED Discharge Orders    None      Portions of this note were generated with dragon dictation software. Dictation errors may occur despite best attempts at proofreading.   Carrie Mew, MD 08/08/19 2155

## 2019-08-09 LAB — CULTURE, BLOOD (ROUTINE X 2)
Culture: NO GROWTH
Culture: NO GROWTH
Special Requests: ADEQUATE
Special Requests: ADEQUATE

## 2019-08-09 NOTE — Telephone Encounter (Signed)
He can have my 3:40 slot next Monday July 19th for 40 minutes. Thanks.

## 2019-08-15 ENCOUNTER — Encounter: Payer: Self-pay | Admitting: Family Medicine

## 2019-08-15 ENCOUNTER — Other Ambulatory Visit: Payer: Self-pay | Admitting: Family Medicine

## 2019-08-15 ENCOUNTER — Other Ambulatory Visit: Payer: Self-pay

## 2019-08-15 ENCOUNTER — Ambulatory Visit (INDEPENDENT_AMBULATORY_CARE_PROVIDER_SITE_OTHER): Payer: Medicare HMO | Admitting: Family Medicine

## 2019-08-15 VITALS — BP 163/89 | HR 93 | Temp 96.9°F | Ht 68.0 in | Wt 320.4 lb

## 2019-08-15 DIAGNOSIS — R0600 Dyspnea, unspecified: Secondary | ICD-10-CM

## 2019-08-15 DIAGNOSIS — S40812A Abrasion of left upper arm, initial encounter: Secondary | ICD-10-CM | POA: Diagnosis not present

## 2019-08-15 DIAGNOSIS — E1122 Type 2 diabetes mellitus with diabetic chronic kidney disease: Secondary | ICD-10-CM

## 2019-08-15 DIAGNOSIS — L304 Erythema intertrigo: Secondary | ICD-10-CM | POA: Diagnosis not present

## 2019-08-15 DIAGNOSIS — D509 Iron deficiency anemia, unspecified: Secondary | ICD-10-CM | POA: Diagnosis not present

## 2019-08-15 DIAGNOSIS — K7469 Other cirrhosis of liver: Secondary | ICD-10-CM

## 2019-08-15 DIAGNOSIS — R0609 Other forms of dyspnea: Secondary | ICD-10-CM

## 2019-08-15 DIAGNOSIS — I482 Chronic atrial fibrillation, unspecified: Secondary | ICD-10-CM | POA: Diagnosis not present

## 2019-08-15 DIAGNOSIS — R197 Diarrhea, unspecified: Secondary | ICD-10-CM | POA: Diagnosis not present

## 2019-08-15 DIAGNOSIS — D684 Acquired coagulation factor deficiency: Secondary | ICD-10-CM

## 2019-08-15 DIAGNOSIS — L989 Disorder of the skin and subcutaneous tissue, unspecified: Secondary | ICD-10-CM

## 2019-08-15 DIAGNOSIS — N183 Chronic kidney disease, stage 3 unspecified: Secondary | ICD-10-CM

## 2019-08-15 LAB — POCT INR
INR: 5.7 — AB (ref 2.0–3.0)
PT: 68.9

## 2019-08-15 MED ORDER — CLOTRIMAZOLE-BETAMETHASONE 1-0.05 % EX CREA: 1.0000 "application " | TOPICAL_CREAM | Freq: Two times a day (BID) | CUTANEOUS | 2 refills | Status: DC

## 2019-08-15 NOTE — Patient Instructions (Signed)
.   Please review the attached list of medications and notify my office if there are any errors.   . Please bring all of your medications to every appointment so we can make sure that our medication list is the same as yours.   Stop taking warfarin until recheck your PT/INR later this wekk.

## 2019-08-15 NOTE — Progress Notes (Signed)
Established patient visit   Patient: Christian Berg   DOB: [birth year]   69 y.o. Male  MRN: [id number] Visit Date: 08/15/2019  Today's healthcare provider: Lelon Huh, MD   Chief Complaint  Patient presents with  . Hospitalization Follow-up   [employer] as a scribe for Lelon Huh, MD.,have documented all relevant documentation on the behalf of Lelon Huh, MD,as directed by  Lelon Huh, MD while in the presence of Lelon Huh, MD.  Subjective    HPI  Follow up ER visit  Patient was seen in ER for syncope, dehydration, acute on chronic renal insufficiency, diabetes and atrial fibrillation on 08/08/2019. Treatment for this included having him hold his next dose of Lasix and follow-up with heart failure clinic on the following day. He reports good compliance with treatment. Patient has an appointment to follow up with Cardiology (Dr. Nehemiah Massed) tomorrow. He reports this condition is Unchanged.  -----------------------------------------------------------------------------------------  Also having persistent diarrhea, cut metformin back to 1/2 tablet twice a day last 2 days.   Sore on bottom, like scabbed lesion for a few year.  He was admitted 08-03-2019 through 08-06-2019 for persistent diarrhea. Had completed work up for infections diarrhea which was negative. Thought to possible due to tramadol withdrawal. Metformin was briefly held and restarted at discharge. Diarrhea did improve but never completely resolved. He also had iron deficient anemia with hgb of 10 at discharge. Telephone follow up was done on 08-08-2019  He also had a scab on his left arm that he picked at 2 days ago and has been oozing blood constantly since then. His daughter is having to change dressing every day due to oozing.       Medications: Outpatient Medications Prior to Visit  Medication Sig  . albuterol (VENTOLIN HFA) 108 (90 Base) MCG/ACT inhaler Inhale 2 puffs into the lungs  every 6 (six) hours as needed for wheezing or shortness of breath.  [name] aspirin EC 81 MG tablet Take 81 mg by mouth every evening.   [name] doxycycline (VIBRA-TABS) 100 MG tablet Take 1 tablet (100 mg total) by mouth every 12 (twelve) hours.  . empagliflozin (JARDIANCE) 10 MG TABS tablet Take 10 mg by mouth daily before breakfast.  . ferrous sulfate 325 (65 FE) MG tablet Take 325 mg by mouth daily with breakfast.  . furosemide (LASIX) 20 MG tablet Take 1 tablet by mouth once daily (Patient taking differently: Take 20 mg by mouth daily. )  . glipiZIDE (GLUCOTROL) 10 MG tablet TAKE 1 TABLET BY MOUTH TWICE DAILY BEFORE MEAL(S) (Patient taking differently: Take 10 mg by mouth 2 (two) times daily before a meal. )  . ketoconazole (NIZORAL) 2 % cream Apply 1 application topically daily as needed for irritation.  [name] lisinopril (ZESTRIL) 20 MG tablet Take 1 tablet (20 mg total) by mouth daily.  [name] lovastatin (MEVACOR) 20 MG tablet Take 1 tablet (20 mg total) by mouth every evening.  . metFORMIN (GLUCOPHAGE) 1000 MG tablet TAKE 1 TABLET BY MOUTH TWICE DAILY WITH MEALS (Patient taking differently: Take 1,000 mg by mouth 2 (two) times daily with a meal. )  . metoprolol tartrate (LOPRESSOR) 100 MG tablet Take 1 tablet (100 mg total) by mouth at bedtime. (Patient taking differently: Take 100 mg by mouth 2 (two) times daily. )  . nystatin cream (MYCOSTATIN) Apply 1 application topically 2 (two) times daily.  . traMADol-acetaminophen (ULTRACET) 37.5-325 MG tablet Take 1 tablet by mouth every 6 (six) hours as needed for severe  pain.  . warfarin (COUMADIN) 5 MG tablet Take as directed by your provider (Patient taking differently: Take 5 mg by mouth 3 (three) times a week. TUESDAY/THURSDAY/SATURDAY/SUNDAY)  . warfarin (COUMADIN) 7.5 MG tablet Take 1 tablet by mouth once daily (Patient taking differently: Take 7.5 mg by mouth 2 (two) times a week. Monday/Wednesday/Friday)  . diltiazem (CARDIZEM CD) 120 MG 24 hr capsule Take 1  capsule (120 mg total) by mouth daily. (Patient not taking: Reported on 08/15/2019)   No facility-administered medications prior to visit.    Review of Systems  Constitutional: Positive for fatigue.  Respiratory: Positive for shortness of breath.   Cardiovascular: Negative.   Musculoskeletal: Negative.   Psychiatric/Behavioral: Positive for sleep disturbance.       Objective    BP (!) 163/89 (BP Location: Right Arm, Patient Position: Sitting, Cuff Size: Large)   Pulse 93   Temp (!) 96.9 F (36.1 C) (Temporal)   Ht 5' 8"  (1.727 m)   Wt (!) 320 lb 6.4 oz (145.3 kg)   SpO2 93%   BMI 48.72 kg/m     Physical Exam   General: Appearance:    Severely obese male in no acute distress  Eyes:    PERRL, conjunctiva/corneas clear, EOM's intact       Lungs:     Clear to auscultation bilaterally, respirations unlabored  Heart:    Normal heart rate. Irregularly irregular rhythm.  3/6 holosystolic murmur at left lower sternal border  MS:   All extremities are intact.   Neurologic:   Awake, alert, oriented x 3. No apparent focal neurological           defect.   Ext:   3+ bilateral LE edema.   Skin:   Red inflamed area under pannus. Warty appearing lesion just above right buttocks.     Results for orders placed or performed in visit on 08/15/19  POCT INR  Result Value Ref Range   INR 5.7 (A) 2.0 - 3.0   PT 68.9     Assessment & Plan     1. Acquired clotting factor deficiency (HCC) Excessive anticoagulated today. He is current taking warfarin 7.13m MWF and 517mother days. Has not taken warfarin since yesterday morning. Is to keep on hold for now and recheck PT/INR in 3-4 days, then resume 93m69maily.   2. [facility] ([facility] with [facility].   3. Intertrigo He states he has tried Nystatin, ketoconazole, and triamcinolone, none have helped, will try clotrimazole-betamethasone (LOTRISONE) cream; Apply 1 application topically 2 (two) times  daily.  Dispense: 45 g; Refill: 2 If not effective then advised will need to see dermatology. Also has what appears to be SK above his buttocks. Recommend he see dermatology for definitive dx, which he declined for now due to too many other doctors appts.   4. Iron deficiency anemia, unspecified iron deficiency anemia type  - CBC  5. Other cirrhosis of liver (HCC)  - Comprehensive metabolic panel  6. Dyspnea on exertion  - Brain natriuretic peptide  7. Abrasion of left upper extremity, initial encounter Continue pressure dressings once a day, put warfarin on hold as above.   8. CKD stage 3 due to type 2 diabetes mellitus (HCCRichard[facility] 9. Skin lesion See # 3  10. Diarrhea Likely secondary to metformin which he has halved the dose of the last two days.   Addressed extensive list of chronic and acute medical problems today requiring 45  minutes reviewing his medical record, counseling patient regarding his conditions and coordination of care.    No follow-ups on file.      The entirety of the information documented in the History of Present Illness, Review of Systems and Physical Exam were personally obtained by me. Portions of this information were initially documented by the CMA and reviewed by me for thoroughness and accuracy.      Lelon Huh, MD  [facility] [phone] (phone) [phone] (fax)  [place]

## 2019-08-16 DIAGNOSIS — E782 Mixed hyperlipidemia: Secondary | ICD-10-CM | POA: Diagnosis not present

## 2019-08-16 DIAGNOSIS — I10 Essential (primary) hypertension: Secondary | ICD-10-CM | POA: Diagnosis not present

## 2019-08-16 DIAGNOSIS — I6523 Occlusion and stenosis of bilateral carotid arteries: Secondary | ICD-10-CM | POA: Diagnosis not present

## 2019-08-16 DIAGNOSIS — I5042 Chronic combined systolic (congestive) and diastolic (congestive) heart failure: Secondary | ICD-10-CM | POA: Diagnosis not present

## 2019-08-16 DIAGNOSIS — R600 Localized edema: Secondary | ICD-10-CM | POA: Diagnosis not present

## 2019-08-16 DIAGNOSIS — I482 Chronic atrial fibrillation, unspecified: Secondary | ICD-10-CM | POA: Diagnosis not present

## 2019-08-16 DIAGNOSIS — I272 Pulmonary hypertension, unspecified: Secondary | ICD-10-CM | POA: Diagnosis not present

## 2019-08-16 DIAGNOSIS — Z6841 Body Mass Index (BMI) 40.0 and over, adult: Secondary | ICD-10-CM | POA: Diagnosis not present

## 2019-08-16 LAB — COMPREHENSIVE METABOLIC PANEL
ALT: 18 IU/L (ref 0–44)
AST: 34 IU/L (ref 0–40)
Albumin/Globulin Ratio: 1.1 — ABNORMAL LOW (ref 1.2–2.2)
Albumin: 3.4 g/dL — ABNORMAL LOW (ref 3.8–4.8)
Alkaline Phosphatase: 105 IU/L (ref 48–121)
BUN/Creatinine Ratio: 19 (ref 10–24)
BUN: 23 mg/dL (ref 8–27)
Bilirubin Total: 0.7 mg/dL (ref 0.0–1.2)
CO2: 19 mmol/L — ABNORMAL LOW (ref 20–29)
Calcium: 9.2 mg/dL (ref 8.6–10.2)
Chloride: 109 mmol/L — ABNORMAL HIGH (ref 96–106)
Creatinine, Ser: 1.23 mg/dL (ref 0.76–1.27)
GFR calc Af Amer: 68 mL/min/{1.73_m2} (ref >59)
GFR calc non Af Amer: 59 mL/min/{1.73_m2} — ABNORMAL LOW (ref >59)
Globulin, Total: 3 g/dL (ref 1.5–4.5)
Glucose: 124 mg/dL — ABNORMAL HIGH (ref 65–99)
Potassium: 5 mmol/L (ref 3.5–5.2)
Sodium: 138 mmol/L (ref 134–144)
Total Protein: 6.4 g/dL (ref 6.0–8.5)

## 2019-08-16 LAB — CBC
Hematocrit: 32.2 % — ABNORMAL LOW (ref 37.5–51.0)
Hemoglobin: 9.7 g/dL — ABNORMAL LOW (ref 13.0–17.7)
MCH: 27.5 pg (ref 26.6–33.0)
MCHC: 30.1 g/dL — ABNORMAL LOW (ref 31.5–35.7)
MCV: 91 fL (ref 79–97)
Platelets: 227 10*3/uL (ref 150–450)
RBC: 3.53 x10E6/uL — ABNORMAL LOW (ref 4.14–5.80)
RDW: 15.2 % (ref 11.6–15.4)
WBC: 5.6 10*3/uL (ref 3.4–10.8)

## 2019-08-16 LAB — BRAIN NATRIURETIC PEPTIDE: BNP: 390.9 pg/mL — ABNORMAL HIGH (ref 0.0–100.0)

## 2019-08-17 ENCOUNTER — Telehealth: Payer: Self-pay

## 2019-08-17 NOTE — Telephone Encounter (Signed)
-----   Message from Birdie Sons, MD sent at 08/17/2019  8:27 AM EDT ----- Kidney functions are better. Is still mildly anemic, but improving.  Elevated BNP indicates some heart failure which is making swelling worse. Recommend he double his dose of furosemide for the next 5 days.  Be sure to come in for PT check Friday as scheduled. and schedule office visit follow up in 1 months. Call if any worsening of swelling or shortness of breath between now and then.

## 2019-08-17 NOTE — Telephone Encounter (Signed)
Patient advised as below. Patient verbalizes understanding and is in agreement with treatment plan.  

## 2019-08-19 ENCOUNTER — Other Ambulatory Visit: Payer: Self-pay

## 2019-08-19 ENCOUNTER — Ambulatory Visit (INDEPENDENT_AMBULATORY_CARE_PROVIDER_SITE_OTHER): Payer: Medicare HMO | Admitting: Physician Assistant

## 2019-08-19 DIAGNOSIS — I482 Chronic atrial fibrillation, unspecified: Secondary | ICD-10-CM | POA: Diagnosis not present

## 2019-08-19 LAB — POCT INR
INR: 2.5 (ref 2.0–3.0)
PT: 29.6

## 2019-08-19 NOTE — Patient Instructions (Signed)
Description   Dx: Atrial Fibrillation Current Coumadin dose: 7.67m MWF except 51060mall other days  PT:29.6 INR:  2.5 Today's Changes: Start 60m58maily.  Recheck: 2 weeks

## 2019-08-19 NOTE — Progress Notes (Signed)
Pt here for PT check only. INR 2.5 today. He is to start Warfarin 61m daily. Recheck on 09/05/19 @ appt with Dr. FCaryn Section

## 2019-08-29 ENCOUNTER — Other Ambulatory Visit: Payer: Self-pay | Admitting: Family Medicine

## 2019-08-29 ENCOUNTER — Telehealth: Payer: Self-pay | Admitting: Family Medicine

## 2019-08-29 DIAGNOSIS — E11628 Type 2 diabetes mellitus with other skin complications: Secondary | ICD-10-CM

## 2019-08-29 DIAGNOSIS — I482 Chronic atrial fibrillation, unspecified: Secondary | ICD-10-CM

## 2019-08-29 MED ORDER — WARFARIN SODIUM 5 MG PO TABS: 5.0000 mg | ORAL_TABLET | ORAL | 1 refills | Status: DC

## 2019-08-29 NOTE — Telephone Encounter (Signed)
Pharmacy called and is requesting to have clarification on the pts warfarin. They state the prescription states that the pt is supposed to take the medication 3x a week, but the prescription also lists 4 days out of the week. Please advise.      [facility] [address], [place]  [place] [address]  Phone: [phone] Fax: [phone]  Hours: Not open 24 hours

## 2019-08-29 NOTE — Telephone Encounter (Signed)
Requested medication (s) are due for refill today: yes  Requested medication (s) are on the active medication list: yes  Last refill:  05/15/2019  Future visit scheduled: yes  Notes to clinic: please verify dose and direction on Metformin   Requested Prescriptions  Pending Prescriptions Disp Refills   metFORMIN (GLUCOPHAGE) 1000 MG tablet [Pharmacy Med Name: metFORMIN HCl 1000 MG Oral Tablet] 180 tablet 0    Sig: Take 1 tablet by mouth twice daily      Endocrinology:  Diabetes - Biguanides Passed - 08/29/2019  8:48 AM      Passed - Cr in normal range and within 360 days    Creatinine  Date Value Ref Range Status  09/17/2013 0.89 0.60 - 1.30 mg/dL Final   Creatinine, Ser  Date Value Ref Range Status  08/15/2019 1.23 0.76 - 1.27 mg/dL Final          Passed - HBA1C is between 0 and 7.9 and within 180 days    Hgb A1c MFr Bld  Date Value Ref Range Status  08/04/2019 6.3 (H) 4.8 - 5.6 % Final    Comment:    (NOTE) Pre diabetes:          5.7%-6.4%  Diabetes:              >6.4%  Glycemic control for   <7.0% adults with diabetes           Passed - eGFR in normal range and within 360 days    EGFR (African American)  Date Value Ref Range Status  09/17/2013 >60  Final   GFR calc Af Amer  Date Value Ref Range Status  08/15/2019 68 >59 mL/min/1.73 Final    Comment:    **[facility] currently reports eGFR in compliance with the current**   recommendations of the [employer]. [facility] will   update reporting as new guidelines are published from the NKF-ASN   Task force.    EGFR (Non-African Amer.)  Date Value Ref Range Status  09/17/2013 >60  Final    Comment:    eGFR values <4m/min/1.73 m2 may be an indication of chronic kidney disease (CKD). Calculated eGFR is useful in patients with stable renal function. The eGFR calculation will not be reliable in acutely ill patients when serum creatinine is changing rapidly. It is not useful in  patients on  dialysis. The eGFR calculation may not be applicable to patients at the low and high extremes of body sizes, pregnant women, and vegetarians.    GFR calc non Af Amer  Date Value Ref Range Status  08/15/2019 59 (L) >59 mL/min/1.73 Final          Passed - Valid encounter within last 6 months    Recent Outpatient Visits           2 weeks ago [facility] clotting factor deficiency ([facility]   2 months ago Shortness of breath   [place]  3 months ago Type 2 diabetes mellitus with other skin complication, without long-term current use of insulin ([facility]   [facility]   4 months ago [place]  5 months ago Candida infection of flexural skin   [facility]      Future Appointments  In 1 week Fisher, Kirstie Peri, MD [facility], [facility]              warfarin (COUMADIN) 5 MG tablet [Pharmacy Med Name: Warfarin Sodium 5 MG Oral Tablet] 30 tablet 0    Sig: USE AS DIRECTED      Hematology:  Anticoagulants - warfarin Failed - 08/29/2019  8:48 AM      Failed - This refill cannot be delegated      Failed - If the patient is managed by Coumadin Clinic - route to their Pool. If not, forward to the provider.      Passed - INR in normal range and within 30 days    INR  Date Value Ref Range Status  08/19/2019 2.5 2.0 - 3.0 Final  08/08/2019 3.0 (H) 0.8 - 1.2 Final    Comment:    (NOTE) INR goal varies based on device and disease states. Performed at [facility], [address] [place], [place] [address]   09/19/2013 1.7  Final    Comment:    INR reference interval applies to patients on anticoagulant therapy. A single INR therapeutic range for coumarins is not optimal for all indications; however, the suggested range for most indications  is 2.0 - 3.0. Exceptions to the INR Reference Range may include: Prosthetic heart valves, acute myocardial infarction, prevention of myocardial infarction, and combinations of aspirin and anticoagulant. The need for a higher or lower target INR must be assessed individually. Reference: The Pharmacology and Management of the Vitamin K  antagonists: the seventh ACCP Conference on Antithrombotic and Thrombolytic Therapy. UEAVW.0981 Sept:126 (3suppl): N9146842. A HCT value >55% may artifactually increase the PT.  In one study,  the increase was an average of 25%. Reference:  "Effect on Routine and Special Coagulation Testing Values of Citrate Anticoagulant Adjustment in Patients with High HCT Values." American Journal of Clinical Pathology 2006;126:400-405.           Passed - Valid encounter within last 3 months    Recent Outpatient Visits           2 weeks ago Acquired clotting factor deficiency [facility])   [facility] Birdie Sons, MD   2 months ago Shortness of breath   [place], [place], [place]   3 months ago Type 2 diabetes mellitus with other skin complication, without long-term current use of insulin [facility])   [facility] Birdie Sons, MD   4 months ago [place], [place], PA-C   5 months ago Candida infection of flexural skin   [facility] [place], [place], [place]       Future Appointments             In 1 week Caryn Section, Kirstie Peri, MD [facility], [place]

## 2019-08-29 NOTE — Telephone Encounter (Signed)
Resent RX for 3 times weekly as patient reported taking.

## 2019-08-30 ENCOUNTER — Ambulatory Visit: Payer: Self-pay

## 2019-09-02 NOTE — Progress Notes (Deleted)
Established patient visit   Patient: Christian Berg   DOB: 1950-03-22   69 y.o. Male  MRN: [id number] Visit Date: 09/05/2019  Today's healthcare provider: Lelon Huh, MD   No chief complaint on file.  Subjective    HPI  Diabetes Mellitus Type II, Follow-up  Lab Results  Component Value Date   HGBA1C 6.3 (H) 08/04/2019   HGBA1C 6.3 (A) 05/31/2019   HGBA1C 8.4 (H) 03/02/2019   Wt Readings from Last 3 Encounters:  08/15/19 (!) 320 lb 6.4 oz (145.3 kg)  08/08/19 (!) 311 lb 14.4 oz (141.5 kg)  08/06/19 (!) 311 lb 14.4 oz (141.5 kg)   Last seen for diabetes 3 months ago.  Management since then includes counseling patient to start back on Jardiance. He reports {excellent/good/fair/poor:19665} compliance with treatment. He {is/is not:21021397} having side effects. {document side effects if present:1} Symptoms: {Yes/No:20286} fatigue {Yes/No:20286} foot ulcerations  {Yes/No:20286} appetite changes {Yes/No:20286} nausea  {Yes/No:20286} paresthesia of the feet  {Yes/No:20286} polydipsia  {Yes/No:20286} polyuria {Yes/No:20286} visual disturbances   {Yes/No:20286} vomiting     Home blood sugar records: {diabetes glucometry results:16657}  Episodes of hypoglycemia? {Yes/No:20286} {enter symptoms and frequency of symptoms if yes:1}   Current insulin regiment: none Most Recent Eye Exam: not UTD {Current exercise:16438:::1} {Current diet habits:16563:::1}  Pertinent Labs: Lab Results  Component Value Date   CHOL 117 03/02/2019   HDL 37 (L) 03/02/2019   LDLCALC 48 03/02/2019   TRIG 197 (H) 03/02/2019   CHOLHDL 3.2 03/02/2019   Lab Results  Component Value Date   NA 138 08/15/2019   K 5.0 08/15/2019   CREATININE 1.23 08/15/2019   GFRNONAA 59 (L) 08/15/2019   GFRAA 68 08/15/2019   GLUCOSE 124 (H) 08/15/2019     ---------------------------------------------------------------------------------------------------  Follow up for elevated BNP:  The patient was  last seen for this 1 month ago. Changes made at last visit include doubling the dose of furosemide for the next 5 days.  He reports {excellent/good/fair/poor:19665} compliance with treatment. He feels that condition is {improved/worse/unchanged:3041574}. He {is/is not:21021397} having side effects. ***  -----------------------------------------------------------------------------------------  Follow up for Atrial Fibrillation:  The patient was last seen for this 2 weeks ago for PT/INR check. Coumadin dose changes made at last visit include changing to 30m daily, and rechecking in 2 weeks.  He reports {excellent/good/fair/poor:19665} compliance with treatment. He feels that condition is {improved/worse/unchanged:3041574}. He {is/is not:21021397} having side effects. ***  -----------------------------------------------------------------------------------------    {Show patient history (optional):23778::" "}   Medications: Outpatient Medications Prior to Visit  Medication Sig  . albuterol (VENTOLIN HFA) 108 (90 Base) MCG/ACT inhaler Inhale 2 puffs into the lungs every 6 (six) hours as needed for wheezing or shortness of breath.  .[name] Kitchenaspirin EC 81 MG tablet Take 81 mg by mouth every evening.   . clotrimazole-betamethasone (LOTRISONE) cream Apply 1 application topically 2 (two) times daily.  .[name] Kitchendiltiazem (CARDIZEM CD) 120 MG 24 hr capsule Take 1 capsule (120 mg total) by mouth daily. (Patient not taking: Reported on 08/15/2019)  . empagliflozin (JARDIANCE) 10 MG TABS tablet Take 10 mg by mouth daily before breakfast.  . ferrous sulfate 325 (65 FE) MG tablet Take 325 mg by mouth daily with breakfast.  . furosemide (LASIX) 20 MG tablet Take 1 tablet by mouth once daily (Patient taking differently: Take 20 mg by mouth daily. )  . glipiZIDE (GLUCOTROL) 10 MG tablet TAKE 1 TABLET BY MOUTH TWICE DAILY BEFORE MEAL(S) (Patient taking differently: Take 10 mg by mouth  2 (two) times daily before a  meal. )  . ketoconazole (NIZORAL) 2 % cream Apply 1 application topically daily as needed for irritation.  [name] lisinopril (ZESTRIL) 20 MG tablet Take 1 tablet (20 mg total) by mouth daily.  [name] lovastatin (MEVACOR) 20 MG tablet Take 1 tablet (20 mg total) by mouth every evening.  . metFORMIN (GLUCOPHAGE) 1000 MG tablet Take 1 tablet by mouth twice daily  . metoprolol tartrate (LOPRESSOR) 100 MG tablet Take 1 tablet (100 mg total) by mouth at bedtime. (Patient taking differently: Take 100 mg by mouth 2 (two) times daily. )  . nystatin cream (MYCOSTATIN) Apply 1 application topically 2 (two) times daily.  . traMADol-acetaminophen (ULTRACET) 37.5-325 MG tablet Take 1 tablet by mouth every 6 (six) hours as needed for severe pain.  [name] warfarin (COUMADIN) 5 MG tablet Take 1 tablet (5 mg total) by mouth 3 (three) times a week.  . warfarin (COUMADIN) 7.5 MG tablet Take 1 tablet by mouth once daily (Patient taking differently: Take 7.5 mg by mouth 2 (two) times a week. Monday/Wednesday/Friday)   No facility-administered medications prior to visit.    Review of Systems  {Heme  Chem  Endocrine  Serology  Results Review (optional):23779::" "}  Objective    There were no vitals taken for this visit. {Show previous vital signs (optional):23777::" "}  Physical Exam  ***  No results found for any visits on 09/05/19.  Assessment & Plan     ***  No follow-ups on file.      {provider attestation***:1}   Lelon Huh, MD  [facility] [phone] (phone) [phone] (fax)  [place]

## 2019-09-03 DIAGNOSIS — K7460 Unspecified cirrhosis of liver: Secondary | ICD-10-CM | POA: Diagnosis not present

## 2019-09-03 DIAGNOSIS — I509 Heart failure, unspecified: Secondary | ICD-10-CM | POA: Diagnosis not present

## 2019-09-03 DIAGNOSIS — I5033 Acute on chronic diastolic (congestive) heart failure: Secondary | ICD-10-CM | POA: Diagnosis not present

## 2019-09-03 DIAGNOSIS — R0902 Hypoxemia: Secondary | ICD-10-CM | POA: Diagnosis not present

## 2019-09-03 DIAGNOSIS — J811 Chronic pulmonary edema: Secondary | ICD-10-CM | POA: Diagnosis not present

## 2019-09-03 DIAGNOSIS — I130 Hypertensive heart and chronic kidney disease with heart failure and stage 1 through stage 4 chronic kidney disease, or unspecified chronic kidney disease: Secondary | ICD-10-CM | POA: Diagnosis not present

## 2019-09-03 DIAGNOSIS — R918 Other nonspecific abnormal finding of lung field: Secondary | ICD-10-CM | POA: Diagnosis not present

## 2019-09-03 DIAGNOSIS — J90 Pleural effusion, not elsewhere classified: Secondary | ICD-10-CM | POA: Diagnosis not present

## 2019-09-03 DIAGNOSIS — L97919 Non-pressure chronic ulcer of unspecified part of right lower leg with unspecified severity: Secondary | ICD-10-CM | POA: Diagnosis not present

## 2019-09-03 DIAGNOSIS — N183 Chronic kidney disease, stage 3 unspecified: Secondary | ICD-10-CM | POA: Diagnosis not present

## 2019-09-03 DIAGNOSIS — I482 Chronic atrial fibrillation, unspecified: Secondary | ICD-10-CM | POA: Diagnosis not present

## 2019-09-03 DIAGNOSIS — E1122 Type 2 diabetes mellitus with diabetic chronic kidney disease: Secondary | ICD-10-CM | POA: Diagnosis not present

## 2019-09-03 DIAGNOSIS — Z20822 Contact with and (suspected) exposure to covid-19: Secondary | ICD-10-CM | POA: Diagnosis not present

## 2019-09-03 DIAGNOSIS — E11622 Type 2 diabetes mellitus with other skin ulcer: Secondary | ICD-10-CM | POA: Diagnosis not present

## 2019-09-03 DIAGNOSIS — Z6841 Body Mass Index (BMI) 40.0 and over, adult: Secondary | ICD-10-CM | POA: Diagnosis not present

## 2019-09-03 DIAGNOSIS — I110 Hypertensive heart disease with heart failure: Secondary | ICD-10-CM | POA: Diagnosis not present

## 2019-09-04 DIAGNOSIS — R21 Rash and other nonspecific skin eruption: Secondary | ICD-10-CM | POA: Diagnosis not present

## 2019-09-04 DIAGNOSIS — I4891 Unspecified atrial fibrillation: Secondary | ICD-10-CM | POA: Diagnosis not present

## 2019-09-04 DIAGNOSIS — K921 Melena: Secondary | ICD-10-CM | POA: Diagnosis not present

## 2019-09-04 DIAGNOSIS — D509 Iron deficiency anemia, unspecified: Secondary | ICD-10-CM | POA: Diagnosis not present

## 2019-09-04 DIAGNOSIS — I5033 Acute on chronic diastolic (congestive) heart failure: Secondary | ICD-10-CM | POA: Diagnosis not present

## 2019-09-04 DIAGNOSIS — Z7901 Long term (current) use of anticoagulants: Secondary | ICD-10-CM | POA: Diagnosis not present

## 2019-09-04 DIAGNOSIS — R791 Abnormal coagulation profile: Secondary | ICD-10-CM | POA: Diagnosis not present

## 2019-09-05 ENCOUNTER — Ambulatory Visit: Payer: Medicare HMO | Admitting: Family Medicine

## 2019-09-05 DIAGNOSIS — I509 Heart failure, unspecified: Secondary | ICD-10-CM | POA: Diagnosis not present

## 2019-09-06 DIAGNOSIS — D509 Iron deficiency anemia, unspecified: Secondary | ICD-10-CM | POA: Diagnosis not present

## 2019-09-06 DIAGNOSIS — I5033 Acute on chronic diastolic (congestive) heart failure: Secondary | ICD-10-CM | POA: Diagnosis not present

## 2019-09-06 DIAGNOSIS — K921 Melena: Secondary | ICD-10-CM | POA: Diagnosis not present

## 2019-09-06 DIAGNOSIS — Z7901 Long term (current) use of anticoagulants: Secondary | ICD-10-CM | POA: Diagnosis not present

## 2019-09-06 DIAGNOSIS — R791 Abnormal coagulation profile: Secondary | ICD-10-CM | POA: Diagnosis not present

## 2019-09-06 DIAGNOSIS — R21 Rash and other nonspecific skin eruption: Secondary | ICD-10-CM | POA: Diagnosis not present

## 2019-09-06 DIAGNOSIS — I4891 Unspecified atrial fibrillation: Secondary | ICD-10-CM | POA: Diagnosis not present

## 2019-09-08 DIAGNOSIS — E1151 Type 2 diabetes mellitus with diabetic peripheral angiopathy without gangrene: Secondary | ICD-10-CM | POA: Diagnosis not present

## 2019-09-08 DIAGNOSIS — G8929 Other chronic pain: Secondary | ICD-10-CM | POA: Diagnosis not present

## 2019-09-08 DIAGNOSIS — E785 Hyperlipidemia, unspecified: Secondary | ICD-10-CM | POA: Diagnosis not present

## 2019-09-08 DIAGNOSIS — I5033 Acute on chronic diastolic (congestive) heart failure: Secondary | ICD-10-CM | POA: Diagnosis not present

## 2019-09-08 DIAGNOSIS — D689 Coagulation defect, unspecified: Secondary | ICD-10-CM | POA: Diagnosis not present

## 2019-09-08 DIAGNOSIS — D500 Iron deficiency anemia secondary to blood loss (chronic): Secondary | ICD-10-CM | POA: Diagnosis not present

## 2019-09-08 DIAGNOSIS — L89320 Pressure ulcer of left buttock, unstageable: Secondary | ICD-10-CM | POA: Diagnosis not present

## 2019-09-08 DIAGNOSIS — I2510 Atherosclerotic heart disease of native coronary artery without angina pectoris: Secondary | ICD-10-CM | POA: Diagnosis not present

## 2019-09-08 DIAGNOSIS — N183 Chronic kidney disease, stage 3 unspecified: Secondary | ICD-10-CM | POA: Diagnosis not present

## 2019-09-08 DIAGNOSIS — I130 Hypertensive heart and chronic kidney disease with heart failure and stage 1 through stage 4 chronic kidney disease, or unspecified chronic kidney disease: Secondary | ICD-10-CM | POA: Diagnosis not present

## 2019-09-12 ENCOUNTER — Telehealth: Payer: Self-pay | Admitting: Family Medicine

## 2019-09-12 NOTE — Telephone Encounter (Signed)
Please review. Thanks!  

## 2019-09-12 NOTE — Telephone Encounter (Signed)
Copied from [place] [id number]. Topic: Quick Communication - Home Health Verbal Orders >> Sep 12, 2019  3:05 PM Jodie Echevaria wrote: Caller/Agency: Mendel Ryder / [place] Number: [phone] ok to LM  Requesting OT/PT/Skilled Nursing/Social Work/Speech Therapy: Social work Magazine features editor, and Skilled Nursing  Frequency: SN 1 w 1, 2 w 1, 1 w 4, 3 as needed  Has 2 wounds on buttocks and right lower leg  want orders to leave open to air and monitor for infection

## 2019-09-12 NOTE — Telephone Encounter (Signed)
Fine to give verbal.

## 2019-09-13 NOTE — Telephone Encounter (Signed)
Tried calling number and unable to leave a message due to mailbox being full. Will try again later.

## 2019-09-14 ENCOUNTER — Inpatient Hospital Stay: Payer: Self-pay | Admitting: Physician Assistant

## 2019-09-16 DIAGNOSIS — L89320 Pressure ulcer of left buttock, unstageable: Secondary | ICD-10-CM | POA: Diagnosis not present

## 2019-09-16 DIAGNOSIS — G8929 Other chronic pain: Secondary | ICD-10-CM | POA: Diagnosis not present

## 2019-09-16 DIAGNOSIS — D500 Iron deficiency anemia secondary to blood loss (chronic): Secondary | ICD-10-CM | POA: Diagnosis not present

## 2019-09-16 DIAGNOSIS — N183 Chronic kidney disease, stage 3 unspecified: Secondary | ICD-10-CM | POA: Diagnosis not present

## 2019-09-16 DIAGNOSIS — E1151 Type 2 diabetes mellitus with diabetic peripheral angiopathy without gangrene: Secondary | ICD-10-CM | POA: Diagnosis not present

## 2019-09-16 DIAGNOSIS — I5033 Acute on chronic diastolic (congestive) heart failure: Secondary | ICD-10-CM | POA: Diagnosis not present

## 2019-09-16 DIAGNOSIS — D689 Coagulation defect, unspecified: Secondary | ICD-10-CM | POA: Diagnosis not present

## 2019-09-16 DIAGNOSIS — E785 Hyperlipidemia, unspecified: Secondary | ICD-10-CM | POA: Diagnosis not present

## 2019-09-16 DIAGNOSIS — I130 Hypertensive heart and chronic kidney disease with heart failure and stage 1 through stage 4 chronic kidney disease, or unspecified chronic kidney disease: Secondary | ICD-10-CM | POA: Diagnosis not present

## 2019-09-16 DIAGNOSIS — I2510 Atherosclerotic heart disease of native coronary artery without angina pectoris: Secondary | ICD-10-CM | POA: Diagnosis not present

## 2019-09-20 ENCOUNTER — Telehealth: Payer: Self-pay

## 2019-09-20 DIAGNOSIS — G8929 Other chronic pain: Secondary | ICD-10-CM | POA: Diagnosis not present

## 2019-09-20 DIAGNOSIS — I130 Hypertensive heart and chronic kidney disease with heart failure and stage 1 through stage 4 chronic kidney disease, or unspecified chronic kidney disease: Secondary | ICD-10-CM | POA: Diagnosis not present

## 2019-09-20 DIAGNOSIS — E785 Hyperlipidemia, unspecified: Secondary | ICD-10-CM | POA: Diagnosis not present

## 2019-09-20 DIAGNOSIS — E1151 Type 2 diabetes mellitus with diabetic peripheral angiopathy without gangrene: Secondary | ICD-10-CM | POA: Diagnosis not present

## 2019-09-20 DIAGNOSIS — I2510 Atherosclerotic heart disease of native coronary artery without angina pectoris: Secondary | ICD-10-CM | POA: Diagnosis not present

## 2019-09-20 DIAGNOSIS — N183 Chronic kidney disease, stage 3 unspecified: Secondary | ICD-10-CM | POA: Diagnosis not present

## 2019-09-20 DIAGNOSIS — D500 Iron deficiency anemia secondary to blood loss (chronic): Secondary | ICD-10-CM | POA: Diagnosis not present

## 2019-09-20 DIAGNOSIS — L89320 Pressure ulcer of left buttock, unstageable: Secondary | ICD-10-CM | POA: Diagnosis not present

## 2019-09-20 DIAGNOSIS — I5033 Acute on chronic diastolic (congestive) heart failure: Secondary | ICD-10-CM | POA: Diagnosis not present

## 2019-09-20 DIAGNOSIS — D689 Coagulation defect, unspecified: Secondary | ICD-10-CM | POA: Diagnosis not present

## 2019-09-20 NOTE — Telephone Encounter (Signed)
Copied from [place] [phone]. Topic: General - Other >> Sep 20, 2019 12:10 PM Oneta Rack wrote: Osvaldo Human name: Erline Levine  Relation to pt: PT from [place]  Call back number: [phone]   Reason for call:  Requesting verbal orders for PT 1x 2 2x 2 to address strengthening, balance gait and steadiness

## 2019-09-20 NOTE — Telephone Encounter (Signed)
Scheryl Darter as below.

## 2019-09-20 NOTE — Telephone Encounter (Signed)
That's fine

## 2019-09-21 ENCOUNTER — Telehealth: Payer: Self-pay

## 2019-09-21 DIAGNOSIS — E785 Hyperlipidemia, unspecified: Secondary | ICD-10-CM | POA: Diagnosis not present

## 2019-09-21 DIAGNOSIS — G8929 Other chronic pain: Secondary | ICD-10-CM | POA: Diagnosis not present

## 2019-09-21 DIAGNOSIS — N183 Chronic kidney disease, stage 3 unspecified: Secondary | ICD-10-CM | POA: Diagnosis not present

## 2019-09-21 DIAGNOSIS — I5033 Acute on chronic diastolic (congestive) heart failure: Secondary | ICD-10-CM | POA: Diagnosis not present

## 2019-09-21 DIAGNOSIS — I2510 Atherosclerotic heart disease of native coronary artery without angina pectoris: Secondary | ICD-10-CM | POA: Diagnosis not present

## 2019-09-21 DIAGNOSIS — D500 Iron deficiency anemia secondary to blood loss (chronic): Secondary | ICD-10-CM | POA: Diagnosis not present

## 2019-09-21 DIAGNOSIS — L89320 Pressure ulcer of left buttock, unstageable: Secondary | ICD-10-CM | POA: Diagnosis not present

## 2019-09-21 DIAGNOSIS — E1151 Type 2 diabetes mellitus with diabetic peripheral angiopathy without gangrene: Secondary | ICD-10-CM | POA: Diagnosis not present

## 2019-09-21 DIAGNOSIS — I130 Hypertensive heart and chronic kidney disease with heart failure and stage 1 through stage 4 chronic kidney disease, or unspecified chronic kidney disease: Secondary | ICD-10-CM | POA: Diagnosis not present

## 2019-09-21 DIAGNOSIS — D689 Coagulation defect, unspecified: Secondary | ICD-10-CM | POA: Diagnosis not present

## 2019-09-21 NOTE — Telephone Encounter (Signed)
His INR is good. He just needs to make sure to have it checked in one month at the coumadin clinic.

## 2019-09-21 NOTE — Telephone Encounter (Signed)
Patient called, left VM to return the call to the office.

## 2019-09-21 NOTE — Telephone Encounter (Signed)
Tried calling patient. Left message to call back. OK for [facility] to advise patient of message.

## 2019-09-21 NOTE — Telephone Encounter (Signed)
Spoke with Ria Comment on the phone and relayed message below. Ria Comment states that she checked patients INR when she was at his home today because she saw that it had been a while since it was last checked. She reports that his INR today was 2.9 and his current dose is 31m daily. She wanted to let you know that she did orthostatics on patient which she reports were normal. She states the reason she had did orthostatics was because patient reported that today he has felt dizzy upon standing and reports that he has pain in his head when standing. LRia Commentreports blood pressure and other vitals were within normal limits. I contacted patient to further triage him, he reports that he had a headache this morning that was above his eyes and states that headache last 376m. Patient denied nausea, vomiting,photophobia  Or visual disturbances. Patient states that this was first incident with headache and has no concerns about it at this time since onset was sudden. Patient reports that he does have dizziness if he stands up immediately out of his lift chair he states it does cause him to feel off balance but reports that is only time he experiences sudden dizziness. I suggest patient come in office for evaluation this week and patient declined. Patient reports that he will monitor his symptoms and report to usKoreaf there is any changes or worsening symptoms. KW

## 2019-09-21 NOTE — Telephone Encounter (Signed)
Yes, it looks like he is due for pt/inr and they can do if he isn't coming to the office for it.

## 2019-09-21 NOTE — Telephone Encounter (Signed)
Copied from [place] [phone]. Topic: General - Other >> Sep 21, 2019 12:14 PM Rainey Pines A wrote: Ria Comment from [facility] wants to know if she needs to have patients inr checked. Please advise  Best contact [phone]

## 2019-09-27 ENCOUNTER — Ambulatory Visit: Payer: Self-pay | Admitting: Family Medicine

## 2019-09-27 DIAGNOSIS — D500 Iron deficiency anemia secondary to blood loss (chronic): Secondary | ICD-10-CM | POA: Diagnosis not present

## 2019-09-27 DIAGNOSIS — E1151 Type 2 diabetes mellitus with diabetic peripheral angiopathy without gangrene: Secondary | ICD-10-CM | POA: Diagnosis not present

## 2019-09-27 DIAGNOSIS — I5033 Acute on chronic diastolic (congestive) heart failure: Secondary | ICD-10-CM | POA: Diagnosis not present

## 2019-09-27 DIAGNOSIS — N183 Chronic kidney disease, stage 3 unspecified: Secondary | ICD-10-CM | POA: Diagnosis not present

## 2019-09-27 DIAGNOSIS — E785 Hyperlipidemia, unspecified: Secondary | ICD-10-CM | POA: Diagnosis not present

## 2019-09-27 DIAGNOSIS — I2510 Atherosclerotic heart disease of native coronary artery without angina pectoris: Secondary | ICD-10-CM | POA: Diagnosis not present

## 2019-09-27 DIAGNOSIS — D689 Coagulation defect, unspecified: Secondary | ICD-10-CM | POA: Diagnosis not present

## 2019-09-27 DIAGNOSIS — I130 Hypertensive heart and chronic kidney disease with heart failure and stage 1 through stage 4 chronic kidney disease, or unspecified chronic kidney disease: Secondary | ICD-10-CM | POA: Diagnosis not present

## 2019-09-27 DIAGNOSIS — E119 Type 2 diabetes mellitus without complications: Secondary | ICD-10-CM

## 2019-09-27 DIAGNOSIS — L89320 Pressure ulcer of left buttock, unstageable: Secondary | ICD-10-CM | POA: Diagnosis not present

## 2019-09-27 DIAGNOSIS — G8929 Other chronic pain: Secondary | ICD-10-CM | POA: Diagnosis not present

## 2019-09-27 MED ORDER — GLIPIZIDE 10 MG PO TABS: 10.0000 mg | ORAL_TABLET | Freq: Every day | ORAL | Status: DC

## 2019-09-27 NOTE — Telephone Encounter (Signed)
'  Christine' RN with [facility] calling to report pt with BS of 50 when she arrived at pts home. Nurse is no longer with pt "Just left."  States "Was able to get him up to 81 with 3 glasses of soda and M&Ms. States initially with tremors, dizziness and fatigue. Reports "When I left he was just fatigued." States wife reports he has not been eating well, has had diarrhea x 2 days and "Takes his meds all at one or two times instead of spacing them out." RN states she told wife to check his BS every 10mn for 1 hour to "Make sure it stays up."   Attempted to reach pt to triage at number provided, busy signal. Attempted wife's number "Call cannot be completed at this time." Will continue attempts.  1615: Attempted to reach, busy signal. Please advise

## 2019-09-27 NOTE — Telephone Encounter (Signed)
He needs to cut back on glipizide. I think he takes it twice a day. If so then he needs to cut back to one tablet every day.  If he is only taking one a day now then we will need to change to lower dosage.

## 2019-09-28 NOTE — Telephone Encounter (Signed)
Patient advised and verbalized understanding 

## 2019-09-28 NOTE — Telephone Encounter (Signed)
Just the glipizide.

## 2019-09-28 NOTE — Telephone Encounter (Signed)
Patient has been advised. KW 

## 2019-09-28 NOTE — Telephone Encounter (Signed)
Patient advised as below. Patient has been taking Glipizide twice a day. Patient agrees to cut back to taking it once a day. Patient reports that his fasting blood sugar this morning was 77. Patient wants to know if there is anything else that he needs to cut back on to raise his blood sugars? Patient states yesterday when his sugar was 50, he" felt like he was about to be out of here". Patient has a follow up appointment scheduled for next week on 10/04/2019.

## 2019-10-04 ENCOUNTER — Ambulatory Visit (INDEPENDENT_AMBULATORY_CARE_PROVIDER_SITE_OTHER): Payer: Medicare HMO | Admitting: Family Medicine

## 2019-10-04 ENCOUNTER — Encounter: Payer: Self-pay | Admitting: Family Medicine

## 2019-10-04 ENCOUNTER — Other Ambulatory Visit: Payer: Self-pay

## 2019-10-04 VITALS — BP 142/82 | HR 111 | Temp 98.2°F | Resp 20 | Ht 68.0 in | Wt 321.0 lb

## 2019-10-04 DIAGNOSIS — I482 Chronic atrial fibrillation, unspecified: Secondary | ICD-10-CM | POA: Diagnosis not present

## 2019-10-04 DIAGNOSIS — L03115 Cellulitis of right lower limb: Secondary | ICD-10-CM

## 2019-10-04 DIAGNOSIS — K921 Melena: Secondary | ICD-10-CM

## 2019-10-04 DIAGNOSIS — K7581 Nonalcoholic steatohepatitis (NASH): Secondary | ICD-10-CM | POA: Diagnosis not present

## 2019-10-04 DIAGNOSIS — L304 Erythema intertrigo: Secondary | ICD-10-CM | POA: Diagnosis not present

## 2019-10-04 DIAGNOSIS — Z23 Encounter for immunization: Secondary | ICD-10-CM | POA: Diagnosis not present

## 2019-10-04 DIAGNOSIS — R0600 Dyspnea, unspecified: Secondary | ICD-10-CM | POA: Diagnosis not present

## 2019-10-04 DIAGNOSIS — I10 Essential (primary) hypertension: Secondary | ICD-10-CM | POA: Diagnosis not present

## 2019-10-04 DIAGNOSIS — L409 Psoriasis, unspecified: Secondary | ICD-10-CM | POA: Diagnosis not present

## 2019-10-04 DIAGNOSIS — E119 Type 2 diabetes mellitus without complications: Secondary | ICD-10-CM | POA: Diagnosis not present

## 2019-10-04 DIAGNOSIS — R0609 Other forms of dyspnea: Secondary | ICD-10-CM

## 2019-10-04 LAB — POCT INR
INR: 5.1 — AB (ref 2.0–3.0)
PT: 61.6

## 2019-10-04 MED ORDER — CEPHALEXIN 500 MG PO CAPS: 500.0000 mg | ORAL_CAPSULE | Freq: Four times a day (QID) | ORAL | 0 refills | Status: DC

## 2019-10-04 MED ORDER — WARFARIN SODIUM 5 MG PO TABS: ORAL_TABLET | ORAL | Status: DC

## 2019-10-04 NOTE — Patient Instructions (Signed)
•   Stop taking warfarin for the next 4 days.    Resume taking warfarin on Saturday September 11th, but only take 1/2 of a 5 mg tablet every day

## 2019-10-04 NOTE — Progress Notes (Signed)
Established patient visit   Patient: Christian Berg   DOB: December 24, 1950   69 y.o. Male  MRN: [id number] Visit Date: 10/04/2019  Today's healthcare provider: Lelon Huh, MD   Chief Complaint  Patient presents with  . Diabetes  . Wound Infection  . Blood In Stools  . bruises   Subjective    HPI  Diabetes Mellitus Type II, Follow-up  Lab Results  Component Value Date   HGBA1C 6.3 (H) 08/04/2019   HGBA1C 6.3 (A) 05/31/2019   HGBA1C 8.4 (H) 03/02/2019   Wt Readings from Last 3 Encounters:  10/04/19 (!) 321 lb (145.6 kg)  08/15/19 (!) 320 lb 6.4 oz (145.3 kg)  08/08/19 (!) 311 lb 14.4 oz (141.5 kg)   Last seen for diabetes 05/31/2019. Management since then includes decreasing Glipizide from twice daily to once daily due to low blood sugars. He reports fair compliance with treatment. He is having side effects.  Symptoms: Yes fatigue Yes foot ulcerations  Yes appetite changes Yes nausea  No paresthesia of the feet  No polydipsia  No polyuria No visual disturbances   No vomiting     Home blood sugar records: fasting range: 50s-100s  Episodes of hypoglycemia? Yes patient reports that he does have dizziness and feeling lightheaded when blood sugars get to low.    Current insulin regiment: none Most Recent Eye Exam: not UTD Current exercise: none Current diet habits: well balanced  Pertinent Labs: Lab Results  Component Value Date   CHOL 117 03/02/2019   HDL 37 (L) 03/02/2019   LDLCALC 48 03/02/2019   TRIG 197 (H) 03/02/2019   CHOLHDL 3.2 03/02/2019   Lab Results  Component Value Date   NA 138 08/15/2019   K 5.0 08/15/2019   CREATININE 1.23 08/15/2019   GFRNONAA 59 (L) 08/15/2019   GFRAA 68 08/15/2019   GLUCOSE 124 (H) 08/15/2019     He was hospitalized at [facility] for CHF a month ago. He was diuresed and discharged on 78m oral furosemide daily, and has since gone up to 663mevery day. Since discharge he has been very fatigued and light headed with low  blood sugars and low blood pressures. He was previously taking glipizide 106mwice a day and has weaned off it completely over the last month. He was also on Jardiance which he stopped taking a few weeks ago. He also states he has stopped taking diltiazem since his hospital discharge.   He states that he has been bruising easily since his hospitalization and has cut warfarin down to 1/2 of the 7.5 tablet daily about a week and a half ago. He started noticing dark melanotic stools about a week ago.        Medications: Outpatient Medications Prior to Visit  Medication Sig  . aspirin EC 81 MG tablet Take 81 mg by mouth every evening.   . ferrous sulfate 325 (65 FE) MG tablet Take 325 mg by mouth daily with breakfast.  . furosemide (LASIX) 20 MG tablet Take 1 tablet by mouth once daily (Patient taking differently: Take 60 mg by mouth daily. )  . lisinopril (ZESTRIL) 20 MG tablet Take 1 tablet (20 mg total) by mouth daily.  . lMarland Kitchenvastatin (MEVACOR) 20 MG tablet Take 1 tablet (20 mg total) by mouth every evening.  . metFORMIN (GLUCOPHAGE) 1000 MG tablet Take 1 tablet by mouth twice daily (Patient taking differently: Take 500 mg by mouth 2 (two) times daily with a meal. )  . metoprolol  tartrate (LOPRESSOR) 100 MG tablet Take 1 tablet (100 mg total) by mouth at bedtime. (Patient taking differently: Take 100 mg by mouth 2 (two) times daily. )  . albuterol (VENTOLIN HFA) 108 (90 Base) MCG/ACT inhaler Inhale 2 puffs into the lungs every 6 (six) hours as needed for wheezing or shortness of breath.  . clotrimazole-betamethasone (LOTRISONE) cream Apply 1 application topically 2 (two) times daily. (Patient not taking: Reported on 10/04/2019)  . diltiazem (CARDIZEM CD) 120 MG 24 hr capsule Take 1 capsule (120 mg total) by mouth daily. (Patient not taking: Reported on 10/04/2019)  . empagliflozin (JARDIANCE) 10 MG TABS tablet Take 10 mg by mouth daily before breakfast. (Patient not taking: Reported on 10/04/2019)  .  glipiZIDE (GLUCOTROL) 10 MG tablet Take 1 tablet (10 mg total) by mouth daily before breakfast. (Patient not taking: Reported on 10/04/2019)  . ketoconazole (NIZORAL) 2 % cream Apply 1 application topically daily as needed for irritation.  [name] nystatin cream (MYCOSTATIN) Apply 1 application topically 2 (two) times daily.  . traMADol-acetaminophen (ULTRACET) 37.5-325 MG tablet Take 1 tablet by mouth every 6 (six) hours as needed for severe pain.  [name] warfarin (COUMADIN) 5 MG tablet Take 1 tablet (5 mg total) by mouth 3 (three) times a week. (Patient not taking: Reported on 10/04/2019)  . warfarin (COUMADIN) 7.5 MG tablet Take 1 tablet by mouth once daily (Patient taking differently: Take 3.75 mg by mouth daily. )   No facility-administered medications prior to visit.    Review of Systems  Constitutional: Positive for fatigue.  Respiratory: Negative.   Cardiovascular: Positive for leg swelling.  Gastrointestinal: Positive for blood in stool.  Endocrine: Negative.   Skin: Positive for color change, rash and wound.  Neurological: Positive for dizziness and light-headedness.  Hematological: Bruises/bleeds easily.  Psychiatric/Behavioral: Negative.       Objective    BP (!) 142/82   Pulse (!) 111   Temp 98.2 F (36.8 C)   Resp 20   Ht 5' 8"  (1.727 m)   Wt (!) 321 lb (145.6 kg)   BMI 48.81 kg/m    Physical Exam   General: Appearance:    Severely obese male in no acute distress  Eyes:    PERRL, conjunctiva/corneas clear, EOM's intact       Lungs:     Clear to auscultation bilaterally, respirations unlabored  Heart:    Tachycardic. Irregularly irregular rhythm.  2/6 holosystolic murmur at left lower sternal border  MS:   All extremities are intact.   Neurologic:   Awake, alert, oriented x 3. No apparent focal neurological           defect.   Derm:   Scattered psoriatic lesions across both lower extremeties with several open wounds with 1-2 cm surrounding erythema. Similar lesion  throughout groin area.      Results for orders placed or performed in visit on 10/04/19  POCT INR  Result Value Ref Range   INR 5.1 (A) 2.0 - 3.0   PT 61.6     Assessment & Plan     1. Type 2 diabetes mellitus without complication, without long-term current use of insulin (HCC) A1c is well controlled. Still on metformin, but recently stopped glipizide and Jardiance due to hypoglycemic. Counseled on cardiac benefits of Jardiance which should not cause hypoglycemia. He is reluctant to start back on medication, but will consider after getting labs back.   2. Chronic atrial fibrillation ([place]) He has stopped diltiazem now with mild tachycardia.  He is still on betablocker. He is also over anti-coagulated. Will hold warfarn 4 days then resume at lower dose of - warfarin (COUMADIN) 5 MG tablet; Take 1/2 tablet daily or as directed by physician  Check PT/INR 1 week  3. Dyspnea on exertion  - B Nat Peptide  4. Melena New onset secondary to elevated INR. He has not had colonoscopy in past and anticipate GI referral if labs stable.  - Comprehensive metabolic panel - CBC  5. NASH (nonalcoholic steatohepatitis)  - Comprehensive metabolic panel  6. Benign essential HTN Stable, but off of CCB. Anticipate restarting Jardiance due to CHF.  - TSH  7. Cellulitis of right lower extremity  - cephALEXin (KEFLEX) 500 MG capsule; Take 1 capsule (500 mg total) by mouth 4 (four) times daily for 10 days.  Dispense: 40 capsule; Refill: 0  8. Psoriasis (a type of skin inflammation)  - Ambulatory referral to Dermatology  9. Intertrigo This is very long standing and has not improved significantly with ketoconozole, nystatin or steroid creams. Will have dermatology evaluate.   10. Need for influenza vaccination  - Flu Vaccine QUAD High Dose(Fluad)         The entirety of the information documented in the History of Present Illness, Review of Systems and Physical Exam were personally obtained by  me. Portions of this information were initially documented by the CMA and reviewed by me for thoroughness and accuracy.    Addressed extensive list of chronic and acute medical problems today requiring 45 minutes reviewing his medical record, counseling patient regarding his conditions and coordination of care.      Lelon Huh, MD  [name] [phone] (phone) [phone] (fax)  [place]

## 2019-10-05 LAB — COMPREHENSIVE METABOLIC PANEL
ALT: 15 IU/L (ref 0–44)
AST: 34 IU/L (ref 0–40)
Albumin/Globulin Ratio: 0.7 — ABNORMAL LOW (ref 1.2–2.2)
Albumin: 2.8 g/dL — ABNORMAL LOW (ref 3.8–4.8)
Alkaline Phosphatase: 164 IU/L — ABNORMAL HIGH (ref 48–121)
BUN/Creatinine Ratio: 8 — ABNORMAL LOW (ref 10–24)
BUN: 11 mg/dL (ref 8–27)
Bilirubin Total: 1 mg/dL (ref 0.0–1.2)
CO2: 25 mmol/L (ref 20–29)
Calcium: 8.9 mg/dL (ref 8.6–10.2)
Chloride: 101 mmol/L (ref 96–106)
Creatinine, Ser: 1.3 mg/dL — ABNORMAL HIGH (ref 0.76–1.27)
GFR calc Af Amer: 64 mL/min/{1.73_m2} (ref >59)
GFR calc non Af Amer: 55 mL/min/{1.73_m2} — ABNORMAL LOW (ref >59)
Globulin, Total: 3.8 g/dL (ref 1.5–4.5)
Glucose: 147 mg/dL — ABNORMAL HIGH (ref 65–99)
Potassium: 3.7 mmol/L (ref 3.5–5.2)
Sodium: 140 mmol/L (ref 134–144)
Total Protein: 6.6 g/dL (ref 6.0–8.5)

## 2019-10-05 LAB — CBC
Hematocrit: 30.2 % — ABNORMAL LOW (ref 37.5–51.0)
Hemoglobin: 9.3 g/dL — ABNORMAL LOW (ref 13.0–17.7)
MCH: 28.7 pg (ref 26.6–33.0)
MCHC: 30.8 g/dL — ABNORMAL LOW (ref 31.5–35.7)
MCV: 93 fL (ref 79–97)
Platelets: 224 10*3/uL (ref 150–450)
RBC: 3.24 x10E6/uL — ABNORMAL LOW (ref 4.14–5.80)
RDW: 17.7 % — ABNORMAL HIGH (ref 11.6–15.4)
WBC: 5.1 10*3/uL (ref 3.4–10.8)

## 2019-10-05 LAB — BRAIN NATRIURETIC PEPTIDE: BNP: 444.3 pg/mL — ABNORMAL HIGH (ref 0.0–100.0)

## 2019-10-05 LAB — TSH: TSH: 2.13 u[IU]/mL (ref 0.450–4.500)

## 2019-10-06 ENCOUNTER — Telehealth: Payer: Self-pay

## 2019-10-06 DIAGNOSIS — G8929 Other chronic pain: Secondary | ICD-10-CM | POA: Diagnosis not present

## 2019-10-06 DIAGNOSIS — E1151 Type 2 diabetes mellitus with diabetic peripheral angiopathy without gangrene: Secondary | ICD-10-CM | POA: Diagnosis not present

## 2019-10-06 DIAGNOSIS — D500 Iron deficiency anemia secondary to blood loss (chronic): Secondary | ICD-10-CM | POA: Diagnosis not present

## 2019-10-06 DIAGNOSIS — I5033 Acute on chronic diastolic (congestive) heart failure: Secondary | ICD-10-CM | POA: Diagnosis not present

## 2019-10-06 DIAGNOSIS — N183 Chronic kidney disease, stage 3 unspecified: Secondary | ICD-10-CM | POA: Diagnosis not present

## 2019-10-06 DIAGNOSIS — I2510 Atherosclerotic heart disease of native coronary artery without angina pectoris: Secondary | ICD-10-CM | POA: Diagnosis not present

## 2019-10-06 DIAGNOSIS — D689 Coagulation defect, unspecified: Secondary | ICD-10-CM | POA: Diagnosis not present

## 2019-10-06 DIAGNOSIS — I130 Hypertensive heart and chronic kidney disease with heart failure and stage 1 through stage 4 chronic kidney disease, or unspecified chronic kidney disease: Secondary | ICD-10-CM | POA: Diagnosis not present

## 2019-10-06 DIAGNOSIS — E785 Hyperlipidemia, unspecified: Secondary | ICD-10-CM | POA: Diagnosis not present

## 2019-10-06 DIAGNOSIS — L89320 Pressure ulcer of left buttock, unstageable: Secondary | ICD-10-CM | POA: Diagnosis not present

## 2019-10-06 NOTE — Telephone Encounter (Signed)
Left message for patient to call back okay for [facility] triage to advise patient of results. KW

## 2019-10-06 NOTE — Telephone Encounter (Signed)
Result note read to patient, verbalizes understanding.  Pt states he did not tolerate Jardiance. States caused dizziness, loss of balance "I even fell a few times." Assured pt NT would alert Dr. Caryn Section for further review.

## 2019-10-06 NOTE — Telephone Encounter (Signed)
-----   Message from Birdie Sons, MD sent at 10/05/2019 10:39 AM EDT ----- Kidney functions and electrolytes are normal. Since he is no longer taking glipizide and diltiazem, he should be able to tolerate Jardiance which would help with his swelling and heart functions. He should start back but just take 1/2 tablet of Jardiance a day for the next 4 days, then increase to 1 tablet daily. He can also take a fourth furosemide once a day for the next 4 days, then go back down to 3 tablets daily.  He is still a little anemic. Please make sure he is taking the iron supplement every day.  More lab results will be completed before the end of the week, so we will get in touch with him when they are back.

## 2019-10-08 ENCOUNTER — Emergency Department: Payer: Medicare HMO

## 2019-10-08 ENCOUNTER — Encounter: Payer: Self-pay | Admitting: Emergency Medicine

## 2019-10-08 ENCOUNTER — Inpatient Hospital Stay
Admission: EM | Admit: 2019-10-08 | Discharge: 2019-10-23 | DRG: 291 | Disposition: A | Discharge status: Home / Self Care | Payer: Medicare HMO | Attending: Internal Medicine | Admitting: Internal Medicine

## 2019-10-08 ENCOUNTER — Other Ambulatory Visit: Payer: Self-pay

## 2019-10-08 DIAGNOSIS — Z6841 Body Mass Index (BMI) 40.0 and over, adult: Secondary | ICD-10-CM | POA: Diagnosis not present

## 2019-10-08 DIAGNOSIS — E669 Obesity, unspecified: Secondary | ICD-10-CM | POA: Diagnosis present

## 2019-10-08 DIAGNOSIS — N179 Acute kidney failure, unspecified: Secondary | ICD-10-CM | POA: Diagnosis not present

## 2019-10-08 DIAGNOSIS — E1151 Type 2 diabetes mellitus with diabetic peripheral angiopathy without gangrene: Secondary | ICD-10-CM | POA: Diagnosis present

## 2019-10-08 DIAGNOSIS — I110 Hypertensive heart disease with heart failure: Secondary | ICD-10-CM | POA: Diagnosis not present

## 2019-10-08 DIAGNOSIS — K922 Gastrointestinal hemorrhage, unspecified: Secondary | ICD-10-CM | POA: Diagnosis not present

## 2019-10-08 DIAGNOSIS — I5030 Unspecified diastolic (congestive) heart failure: Secondary | ICD-10-CM | POA: Diagnosis present

## 2019-10-08 DIAGNOSIS — I6523 Occlusion and stenosis of bilateral carotid arteries: Secondary | ICD-10-CM | POA: Diagnosis present

## 2019-10-08 DIAGNOSIS — Z8673 Personal history of transient ischemic attack (TIA), and cerebral infarction without residual deficits: Secondary | ICD-10-CM

## 2019-10-08 DIAGNOSIS — Z7984 Long term (current) use of oral hypoglycemic drugs: Secondary | ICD-10-CM

## 2019-10-08 DIAGNOSIS — E7800 Pure hypercholesterolemia, unspecified: Secondary | ICD-10-CM | POA: Diagnosis present

## 2019-10-08 DIAGNOSIS — R5381 Other malaise: Secondary | ICD-10-CM | POA: Diagnosis present

## 2019-10-08 DIAGNOSIS — R109 Unspecified abdominal pain: Secondary | ICD-10-CM | POA: Diagnosis not present

## 2019-10-08 DIAGNOSIS — Z7982 Long term (current) use of aspirin: Secondary | ICD-10-CM

## 2019-10-08 DIAGNOSIS — K921 Melena: Secondary | ICD-10-CM | POA: Diagnosis not present

## 2019-10-08 DIAGNOSIS — S81802A Unspecified open wound, left lower leg, initial encounter: Secondary | ICD-10-CM | POA: Diagnosis not present

## 2019-10-08 DIAGNOSIS — E119 Type 2 diabetes mellitus without complications: Secondary | ICD-10-CM

## 2019-10-08 DIAGNOSIS — L97929 Non-pressure chronic ulcer of unspecified part of left lower leg with unspecified severity: Secondary | ICD-10-CM | POA: Diagnosis present

## 2019-10-08 DIAGNOSIS — I5033 Acute on chronic diastolic (congestive) heart failure: Secondary | ICD-10-CM

## 2019-10-08 DIAGNOSIS — J45909 Unspecified asthma, uncomplicated: Secondary | ICD-10-CM | POA: Diagnosis not present

## 2019-10-08 DIAGNOSIS — Z882 Allergy status to sulfonamides status: Secondary | ICD-10-CM

## 2019-10-08 DIAGNOSIS — M5136 Other intervertebral disc degeneration, lumbar region: Secondary | ICD-10-CM | POA: Diagnosis present

## 2019-10-08 DIAGNOSIS — Z881 Allergy status to other antibiotic agents status: Secondary | ICD-10-CM | POA: Diagnosis not present

## 2019-10-08 DIAGNOSIS — R0602 Shortness of breath: Secondary | ICD-10-CM | POA: Diagnosis not present

## 2019-10-08 DIAGNOSIS — R32 Unspecified urinary incontinence: Secondary | ICD-10-CM | POA: Diagnosis present

## 2019-10-08 DIAGNOSIS — A0472 Enterocolitis due to Clostridium difficile, not specified as recurrent: Secondary | ICD-10-CM | POA: Diagnosis not present

## 2019-10-08 DIAGNOSIS — K7581 Nonalcoholic steatohepatitis (NASH): Secondary | ICD-10-CM | POA: Diagnosis present

## 2019-10-08 DIAGNOSIS — I10 Essential (primary) hypertension: Secondary | ICD-10-CM

## 2019-10-08 DIAGNOSIS — E876 Hypokalemia: Secondary | ICD-10-CM | POA: Diagnosis not present

## 2019-10-08 DIAGNOSIS — N1831 Chronic kidney disease, stage 3a: Secondary | ICD-10-CM | POA: Diagnosis present

## 2019-10-08 DIAGNOSIS — E11628 Type 2 diabetes mellitus with other skin complications: Secondary | ICD-10-CM

## 2019-10-08 DIAGNOSIS — N138 Other obstructive and reflux uropathy: Secondary | ICD-10-CM | POA: Diagnosis present

## 2019-10-08 DIAGNOSIS — L22 Diaper dermatitis: Secondary | ICD-10-CM | POA: Diagnosis present

## 2019-10-08 DIAGNOSIS — L97919 Non-pressure chronic ulcer of unspecified part of right lower leg with unspecified severity: Secondary | ICD-10-CM | POA: Diagnosis present

## 2019-10-08 DIAGNOSIS — Z950 Presence of cardiac pacemaker: Secondary | ICD-10-CM

## 2019-10-08 DIAGNOSIS — D62 Acute posthemorrhagic anemia: Secondary | ICD-10-CM | POA: Diagnosis not present

## 2019-10-08 DIAGNOSIS — N401 Enlarged prostate with lower urinary tract symptoms: Secondary | ICD-10-CM | POA: Diagnosis present

## 2019-10-08 DIAGNOSIS — K31811 Angiodysplasia of stomach and duodenum with bleeding: Secondary | ICD-10-CM | POA: Diagnosis not present

## 2019-10-08 DIAGNOSIS — J811 Chronic pulmonary edema: Secondary | ICD-10-CM | POA: Diagnosis not present

## 2019-10-08 DIAGNOSIS — Z87442 Personal history of urinary calculi: Secondary | ICD-10-CM | POA: Diagnosis not present

## 2019-10-08 DIAGNOSIS — R0989 Other specified symptoms and signs involving the circulatory and respiratory systems: Secondary | ICD-10-CM | POA: Diagnosis not present

## 2019-10-08 DIAGNOSIS — I482 Chronic atrial fibrillation, unspecified: Secondary | ICD-10-CM

## 2019-10-08 DIAGNOSIS — Z20822 Contact with and (suspected) exposure to covid-19: Secondary | ICD-10-CM | POA: Diagnosis present

## 2019-10-08 DIAGNOSIS — L03119 Cellulitis of unspecified part of limb: Secondary | ICD-10-CM | POA: Diagnosis present

## 2019-10-08 DIAGNOSIS — I959 Hypotension, unspecified: Secondary | ICD-10-CM

## 2019-10-08 DIAGNOSIS — Z888 Allergy status to other drugs, medicaments and biological substances status: Secondary | ICD-10-CM

## 2019-10-08 DIAGNOSIS — E1122 Type 2 diabetes mellitus with diabetic chronic kidney disease: Secondary | ICD-10-CM

## 2019-10-08 DIAGNOSIS — K264 Chronic or unspecified duodenal ulcer with hemorrhage: Secondary | ICD-10-CM | POA: Diagnosis present

## 2019-10-08 DIAGNOSIS — Z79899 Other long term (current) drug therapy: Secondary | ICD-10-CM

## 2019-10-08 DIAGNOSIS — K766 Portal hypertension: Secondary | ICD-10-CM | POA: Diagnosis present

## 2019-10-08 DIAGNOSIS — I509 Heart failure, unspecified: Secondary | ICD-10-CM | POA: Diagnosis not present

## 2019-10-08 DIAGNOSIS — I4891 Unspecified atrial fibrillation: Secondary | ICD-10-CM | POA: Diagnosis not present

## 2019-10-08 DIAGNOSIS — N183 Chronic kidney disease, stage 3 unspecified: Secondary | ICD-10-CM | POA: Diagnosis not present

## 2019-10-08 DIAGNOSIS — I130 Hypertensive heart and chronic kidney disease with heart failure and stage 1 through stage 4 chronic kidney disease, or unspecified chronic kidney disease: Secondary | ICD-10-CM | POA: Diagnosis not present

## 2019-10-08 DIAGNOSIS — Z8249 Family history of ischemic heart disease and other diseases of the circulatory system: Secondary | ICD-10-CM

## 2019-10-08 DIAGNOSIS — Z7901 Long term (current) use of anticoagulants: Secondary | ICD-10-CM

## 2019-10-08 DIAGNOSIS — K7460 Unspecified cirrhosis of liver: Secondary | ICD-10-CM | POA: Diagnosis not present

## 2019-10-08 DIAGNOSIS — M7989 Other specified soft tissue disorders: Secondary | ICD-10-CM | POA: Diagnosis not present

## 2019-10-08 DIAGNOSIS — I272 Pulmonary hypertension, unspecified: Secondary | ICD-10-CM | POA: Diagnosis present

## 2019-10-08 DIAGNOSIS — I700 Atherosclerosis of aorta: Secondary | ICD-10-CM | POA: Diagnosis not present

## 2019-10-08 DIAGNOSIS — L565 Disseminated superficial actinic porokeratosis (DSAP): Secondary | ICD-10-CM | POA: Diagnosis present

## 2019-10-08 DIAGNOSIS — E538 Deficiency of other specified B group vitamins: Secondary | ICD-10-CM | POA: Diagnosis present

## 2019-10-08 DIAGNOSIS — G4700 Insomnia, unspecified: Secondary | ICD-10-CM | POA: Diagnosis present

## 2019-10-08 DIAGNOSIS — D631 Anemia in chronic kidney disease: Secondary | ICD-10-CM | POA: Diagnosis present

## 2019-10-08 DIAGNOSIS — E861 Hypovolemia: Secondary | ICD-10-CM | POA: Diagnosis present

## 2019-10-08 DIAGNOSIS — I517 Cardiomegaly: Secondary | ICD-10-CM | POA: Diagnosis not present

## 2019-10-08 LAB — COMPREHENSIVE METABOLIC PANEL
ALT: 19 U/L (ref 0–44)
AST: 51 U/L — ABNORMAL HIGH (ref 15–41)
Albumin: 2.7 g/dL — ABNORMAL LOW (ref 3.5–5.0)
Alkaline Phosphatase: 121 U/L (ref 38–126)
Anion gap: 12 (ref 5–15)
BUN: 14 mg/dL (ref 8–23)
CO2: 26 mmol/L (ref 22–32)
Calcium: 8.4 mg/dL — ABNORMAL LOW (ref 8.9–10.3)
Chloride: 100 mmol/L (ref 98–111)
Creatinine, Ser: 1.38 mg/dL — ABNORMAL HIGH (ref 0.61–1.24)
GFR calc Af Amer: 60 mL/min — ABNORMAL LOW (ref >60)
GFR calc non Af Amer: 51 mL/min — ABNORMAL LOW (ref >60)
Glucose, Bld: 145 mg/dL — ABNORMAL HIGH (ref 70–99)
Potassium: 3.5 mmol/L (ref 3.5–5.1)
Sodium: 138 mmol/L (ref 135–145)
Total Bilirubin: 2 mg/dL — ABNORMAL HIGH (ref 0.3–1.2)
Total Protein: 7.3 g/dL (ref 6.5–8.1)

## 2019-10-08 LAB — CBC
HCT: 29.3 % — ABNORMAL LOW (ref 39.0–52.0)
Hemoglobin: 9.3 g/dL — ABNORMAL LOW (ref 13.0–17.0)
MCH: 30.4 pg (ref 26.0–34.0)
MCHC: 31.7 g/dL (ref 30.0–36.0)
MCV: 95.8 fL (ref 80.0–100.0)
Platelets: 235 10*3/uL (ref 150–400)
RBC: 3.06 MIL/uL — ABNORMAL LOW (ref 4.22–5.81)
RDW: 22.3 % — ABNORMAL HIGH (ref 11.5–15.5)
WBC: 9 10*3/uL (ref 4.0–10.5)
nRBC: 0 % (ref 0.0–0.2)

## 2019-10-08 LAB — BRAIN NATRIURETIC PEPTIDE: B Natriuretic Peptide: 399.1 pg/mL — ABNORMAL HIGH (ref 0.0–100.0)

## 2019-10-08 LAB — TROPONIN I (HIGH SENSITIVITY)
Troponin I (High Sensitivity): 25 ng/L — ABNORMAL HIGH (ref <18)
Troponin I (High Sensitivity): 26 ng/L — ABNORMAL HIGH (ref <18)

## 2019-10-08 MED ORDER — SODIUM CHLORIDE 0.9 % IV SOLN
2.0000 g | Freq: Three times a day (TID) | INTRAVENOUS | Status: DC
  Administered 2019-10-09 – 2019-10-10 (×4): 2 g via INTRAVENOUS
  Filled 2019-10-08 (×7): qty 2

## 2019-10-08 MED ORDER — FUROSEMIDE 10 MG/ML IJ SOLN
60.0000 mg | Freq: Once | INTRAMUSCULAR | Status: AC
  Administered 2019-10-09: 60 mg via INTRAVENOUS
  Filled 2019-10-08: qty 8

## 2019-10-08 NOTE — Progress Notes (Signed)
Pharmacy Antibiotic Note  Christian Berg is a 69 y.o. male admitted on 10/08/2019 with pneumonia.  Pharmacy has been consulted for cefepime dosing.  Plan: Will start cefepime 2g IV q8h per CrCl > 60 ml/min and will continue to monitor renal function and adjust doses per changes in renal function.  Height: 5' 8"  (172.7 cm) Weight: 136.1 kg (300 lb) IBW/kg (Calculated) : 68.4  Temp (24hrs), Avg:98.5 F (36.9 C), Min:98.4 F (36.9 C), Max:98.5 F (36.9 C)  Recent Labs  Lab 10/04/19 1149 10/08/19 1706  WBC 5.1 9.0  CREATININE 1.30* 1.38*    Estimated Creatinine Clearance: 67.3 mL/min (A) (by C-G formula based on SCr of 1.38 mg/dL (H)).    Allergies  Allergen Reactions  . Clindamycin/Lincomycin     Chest discomfort  . Sulfa Antibiotics Hives  . Sulfasalazine Hives    Thank you for allowing pharmacy to be a part of this patient's care.  Tobie Lords, PharmD, BCPS Clinical Pharmacist 10/08/2019 11:49 PM

## 2019-10-08 NOTE — H&P (Signed)
History and Physical   BRITON SELLMAN OEH:[id number] DOB: 08/24/1950 DOA: 10/08/2019  Referring MD/NP/PA: Dr. Quentin Cornwall  PCP: Birdie Sons, MD   Outpatient Specialists: None  Patient coming from: Home  Chief Complaint: Shortness of breath and wound care  HPI: Christian Berg is a 69 y.o. male with medical history significant of diabetes, hypertension, diastolic dysfunction CHF, disseminated superficial actinic porokeratosis, degenerative disc disease, recent bleeding episodes, atrial fibrillation and peripheral vascular disease who presented to the ER with left lower extremity ulcer.  He already has a right lower extremity ulcer.  He came in for wound check.  Patient has noted increasing weight about 25 pounds more in the last few weeks.  He is taking diuretics at home.  He also has noted increasing shortness of breath.  He has bruising easily and was told to stop his Coumadin about a week ago.  Patient was seen in the ER in addition to his leg wounds was found to be having episode of anasarca.  Appears he has acute exacerbation of his diastolic CHF so he is being admitted to the hospital for further evaluation and treatment..  ED Course: Temperature is 99.1 blood pressure 130/47 pulse 140 respirate 26 oxygen sats 94% on room air.  White count 9.0 hemoglobin 9.3 and platelet 235.  Chemistry appears to be within normal except for creatinine 1.38 and calcium 8.4.  Glucose 145.  COVID-19 screen is negative.  Chest x-ray showed evidence of mild fluid overload.  X-ray of the tibia-fibula shows no evidence of osseous abnormality.  Patient being admitted with acute exacerbation of CHF and also possible infected wound.  Review of Systems: As per HPI otherwise 10 point review of systems negative.    Past Medical History:  Diagnosis Date  . A-fib ([place])   . Carotid stenosis   . CHF (congestive heart failure) ([place])   . Degenerative disc disease, lumbar    s/p injury  . Diabetes mellitus without  complication ([place])   . Disseminated superficial actinic porokeratosis   . Dysrhythmia    A-FIB, palpatations  . 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 Fatty liver   . History of degenerative disc disease   . History of kidney stones   . Hypercholesteremia   . Hypertension   . Kidney stones   . Obesity   . Pacemaker    inactive  . Peripheral vascular disease ([place])    Carotid stenosis  . Presence of permanent cardiac pacemaker    Inactive  . TIA (transient ischemic attack) 2011   No deficits  . TIA (transient ischemic attack)   . Vitamin B12 deficiency     Past Surgical History:  Procedure Laterality Date  . APPENDECTOMY  2004  . CARDIAC CATHETERIZATION    . CARDIAC PACEMAKER PLACEMENT  2005  . CATARACT EXTRACTION W/PHACO Right 06/14/2014   Procedure: CATARACT EXTRACTION PHACO AND INTRAOCULAR LENS PLACEMENT (IOC);  Surgeon: Leandrew Koyanagi, MD;  Location: [facility];  Service: Ophthalmology;  Laterality: Right;  . CATARACT EXTRACTION W/PHACO Left 10/27/2018   Procedure: CATARACT EXTRACTION PHACO AND INTRAOCULAR LENS PLACEMENT (IOC) LEFT DIABETIC  00:46.4  18.6%  8.63;  Surgeon: Leandrew Koyanagi, MD;  Location: [facility];  Service: Ophthalmology;  Laterality: Left;  Diabetic - oral meds  . CYSTOSCOPY W/ RETROGRADES Bilateral 08/09/2014   Procedure: CYSTOSCOPY WITH RETROGRADE PYELOGRAM;  Surgeon: Hollice Espy, MD;  Location: [facility];  Service: Urology;  Laterality: Bilateral;  . INTUBATION-ENDOTRACHEAL WITH TRACHEOSTOMY STANDBY  03/30/2017   Procedure: INTUBATION-ENDOTRACHEAL  WITH TRACHEOSTOMY STANDBY;  Surgeon: Carloyn Manner, MD;  Location: [facility];  Service: ENT;;     reports that he has never smoked. He has never used smokeless tobacco. He reports that he does not drink alcohol and does not use drugs.  Allergies  Allergen Reactions  . Clindamycin/Lincomycin     Chest discomfort  . Sulfa Antibiotics Hives  . Sulfasalazine Hives    Family History   Adopted: Yes  Problem Relation Age of Onset  . Heart disease Mother   . Fibromyalgia Sister   . Hypertension Daughter   . Migraines Daughter   . Kidney disease Neg Hx   . Prostate cancer Neg Hx   . Bladder Cancer Neg Hx      Prior to Admission medications   Medication Sig Start Date End Date Taking? Authorizing Provider  albuterol (VENTOLIN HFA) 108 (90 Base) MCG/ACT inhaler Inhale 2 puffs into the lungs every 6 (six) hours as needed for wheezing or shortness of breath. 05/31/19   Birdie Sons, MD  aspirin EC 81 MG tablet Take 81 mg by mouth every evening.     [provider]  cephALEXin (KEFLEX) 500 MG capsule Take 1 capsule (500 mg total) by mouth 4 (four) times daily for 10 days. 10/04/19 10/14/19  Birdie Sons, MD  ferrous sulfate 325 (65 FE) MG tablet Take 325 mg by mouth daily with breakfast.    [provider]  furosemide (LASIX) 20 MG tablet Take 1 tablet by mouth once daily Patient taking differently: Take 60 mg by mouth daily.  06/18/19   Birdie Sons, MD  ketoconazole (NIZORAL) 2 % cream Apply 1 application topically daily as needed for irritation. 08/06/19   Loletha Grayer, MD  lisinopril (ZESTRIL) 20 MG tablet Take 1 tablet (20 mg total) by mouth daily. 04/07/19   Birdie Sons, MD  lovastatin (MEVACOR) 20 MG tablet Take 1 tablet (20 mg total) by mouth every evening. 04/08/19   Birdie Sons, MD  metFORMIN (GLUCOPHAGE) 1000 MG tablet Take 1 tablet by mouth twice daily Patient taking differently: Take 500 mg by mouth 2 (two) times daily with a meal.  08/29/19   Birdie Sons, MD  metoprolol tartrate (LOPRESSOR) 100 MG tablet Take 1 tablet (100 mg total) by mouth at bedtime. Patient taking differently: Take 100 mg by mouth 2 (two) times daily.  10/24/16   Demetrios Loll, MD  nystatin cream (MYCOSTATIN) Apply 1 application topically 2 (two) times daily. 07/17/14   Zara Council A, PA-C  traMADol-acetaminophen (ULTRACET) 37.5-325 MG tablet Take 1  tablet by mouth every 6 (six) hours as needed for severe pain. 08/06/19   Loletha Grayer, MD  warfarin (COUMADIN) 5 MG tablet Take 1/2 tablet daily or as directed by physician 10/04/19   Birdie Sons, MD    Physical Exam: Vitals:   10/08/19 1651 10/08/19 1652 10/08/19 2000  BP:   126/79  Pulse: 95  (!) 109  Resp: 20  20  Temp: 98.4 F (36.9 C)  98.5 F (36.9 C)  TempSrc: Oral  Oral  SpO2: 98%  97%  Weight:  136.1 kg   Height:  5' 8"  (1.727 m)       Constitutional: Chronically ill looking, morbidly obese no distress Vitals:   10/08/19 1651 10/08/19 1652 10/08/19 2000  BP:   126/79  Pulse: 95  (!) 109  Resp: 20  20  Temp: 98.4 F (36.9 C)  98.5 F (36.9 C)  TempSrc: Oral  Oral  SpO2: 98%  97%  Weight:  136.1 kg   Height:  5' 8"  (1.727 m)    Eyes: PERRL, lids and conjunctivae normal ENMT: Mucous membranes are moist. Posterior pharynx clear of any exudate or lesions.Normal dentition.  Neck: normal, supple, no masses, no thyromegaly Respiratory: Coarse breath sounds with diffuse bilateral crackles normal respiratory effort. No accessory muscle use.  Cardiovascular: Irregularly irregular rate and rhythm, no murmurs / rubs / gallops.  2+ extremity edema. 2+ pedal pulses. No carotid bruits.  Abdomen: no tenderness, no masses palpated. No hepatosplenomegaly. Bowel sounds positive.  Musculoskeletal: no clubbing / cyanosis. No joint deformity upper and lower extremities. Good ROM, no contractures. Normal muscle tone.  Skin: Multiple skin rashes and bruises, bilateral leg ulcers and anterior medial aspect of the left leg and anterior aspect of the right leg, Neurologic: CN 2-12 grossly intact. Sensation intact, DTR normal. Strength 5/5 in all 4.  Psychiatric: Normal judgment and insight. Alert and oriented x 3. Normal mood.     Labs on Admission: I have personally reviewed following labs and imaging studies  CBC: Recent Labs  Lab 10/04/19 1149 10/08/19 1706  WBC 5.1 9.0   HGB 9.3* 9.3*  HCT 30.2* 29.3*  MCV 93 95.8  PLT 224 335   Basic Metabolic Panel: Recent Labs  Lab 10/04/19 1149 10/08/19 1706  NA 140 138  K 3.7 3.5  CL 101 100  CO2 25 26  GLUCOSE 147* 145*  BUN 11 14  CREATININE 1.30* 1.38*  CALCIUM 8.9 8.4*   GFR: Estimated Creatinine Clearance: 67.3 mL/min (A) (by C-G formula based on SCr of 1.38 mg/dL (H)). Liver Function Tests: Recent Labs  Lab 10/04/19 1149 10/08/19 1706  AST 34 51*  ALT 15 19  ALKPHOS 164* 121  BILITOT 1.0 2.0*  PROT 6.6 7.3  ALBUMIN 2.8* 2.7*   No results for input(s): LIPASE, AMYLASE in the last 168 hours. No results for input(s): AMMONIA in the last 168 hours. Coagulation Profile: Recent Labs  Lab 10/04/19 1156  INR 5.1*   Cardiac Enzymes: No results for input(s): CKTOTAL, CKMB, CKMBINDEX, TROPONINI in the last 168 hours. BNP (last 3 results) No results for input(s): PROBNP in the last 8760 hours. HbA1C: No results for input(s): HGBA1C in the last 72 hours. CBG: No results for input(s): GLUCAP in the last 168 hours. Lipid Profile: No results for input(s): CHOL, HDL, LDLCALC, TRIG, CHOLHDL, LDLDIRECT in the last 72 hours. Thyroid Function Tests: No results for input(s): TSH, T4TOTAL, FREET4, T3FREE, THYROIDAB in the last 72 hours. Anemia Panel: No results for input(s): VITAMINB12, FOLATE, FERRITIN, TIBC, IRON, RETICCTPCT in the last 72 hours. Urine analysis:    Component Value Date/Time   COLORURINE YELLOW (A) 08/04/2019 0200   APPEARANCEUR CLEAR (A) 08/04/2019 0200   APPEARANCEUR Cloudy (A) 08/20/2017 1309   LABSPEC 1.037 (H) 08/04/2019 0200   LABSPEC 1.041 09/14/2013 1658   PHURINE 5.0 08/04/2019 0200   GLUCOSEU >=500 (A) 08/04/2019 0200   GLUCOSEU 50 mg/dL 09/14/2013 1658   HGBUR MODERATE (A) 08/04/2019 0200   BILIRUBINUR NEGATIVE 08/04/2019 0200   BILIRUBINUR negative 04/08/2019 1205   BILIRUBINUR Negative 08/20/2017 1309   BILIRUBINUR Negative 09/14/2013 1658   KETONESUR 5 (A)  08/04/2019 0200   PROTEINUR NEGATIVE 08/04/2019 0200   UROBILINOGEN 0.2 04/08/2019 1205   NITRITE NEGATIVE 08/04/2019 0200   LEUKOCYTESUR NEGATIVE 08/04/2019 0200   LEUKOCYTESUR Negative 09/14/2013 1658   Sepsis Labs: @LABRCNTIP (procalcitonin:4,lacticidven:4) )No results found for this or any previous  visit (from the past 240 hour(s)).   Radiological Exams on Admission: DG Chest 2 View  Result Date: 10/08/2019 CLINICAL DATA:  Shortness of breath, open wounds to the posterior left lower leg and anterior right lower leg, history of asthma EXAM: CHEST - 2 VIEW COMPARISON:  Radiograph 08/08/2019, CT 09/15/2015 FINDINGS: Diffuse hazy basilar predominant interstitial opacities with some fissural thickening as well. More patchy retrocardiac infrahilar opacities in the lung bases. No pneumothorax or visible effusion. Prominent cardiac silhouette compatible with cardiomegaly seen on comparison studies. Pacer pack overlies the left chest wall with leads at the cardiac apex and right atrium in similar positioning to prior. Remaining cardiomediastinal contours are unremarkable. No acute osseous or soft tissue abnormality. Degenerative changes are present in the imaged spine and shoulders. Prior left humeral ORIF is noted. IMPRESSION: 1. Features of CHF/volume overload with cardiomegaly and interstitial opacities suggesting edema. 2. More patchy retrocardiac and infrahilar opacities in the lung bases may reflect developing alveolar edema, atelectasis or airspace disease. Electronically Signed   By: Lovena Le M.D.   On: 10/08/2019 17:36   DG Tibia/Fibula Left  Result Date: 10/08/2019 CLINICAL DATA:  Open wound to posterior left lower leg. EXAM: LEFT TIBIA AND FIBULA - 2 VIEW COMPARISON:  None. FINDINGS: There is no evidence of fracture or other focal bone lesions. Mild diffuse soft tissue swelling is seen throughout the left calf with moderate severity soft tissue swelling also noted along the anterior aspect  of the left tibia. No soft tissue air is seen. IMPRESSION: 1. Soft tissue swelling which may be secondary to diffuse cellulitis, without evidence of an acute osseous abnormality. Electronically Signed   By: Virgina Norfolk M.D.   On: 10/08/2019 19:49    EKG: Independently reviewed.  Sinus tachycardia no significant ST changes.  Assessment/Plan Principal Problem:   (HFpEF) heart failure with preserved ejection fraction (HCC) Active Problems:   Diabetes mellitus (HCC)   Benign essential HTN   Benign prostatic hyperplasia with urinary obstruction   Chronic atrial fibrillation (HCC)   Morbid (severe) obesity due to excess calories (HCC)   NASH (nonalcoholic steatohepatitis)   Bilateral carotid artery stenosis   CKD stage 3 due to type 2 diabetes mellitus (HCC)   Acute exacerbation of CHF (congestive heart failure) (HCC)   Leg wound, left, initial encounter     #1 acute exacerbation of diastolic CHF: Patient will be admitted.  Aggressive diuresis with IV Lasix.  Continue home regimen.  Appears to be having Karlene Lineman cirrhosis with this could all be due to low albumin.  Monitor closely.  #2 leg wounds: Patient has right anterior lower leg wound but now has left wound in the same area.  This could be his chronic stasis and could be vascular ulcers.  At this point will get wound care consult.  May require orthopedics or podiatry consult if wound care not effective.  In the meantime it does look like is infected I will initiate IV cefepime empirically.  #3 diabetes: Sliding scale insulin.  Confirm home regimen and continue.  Hold Metformin  #4 chronic atrial fibrillation: Rate appears controlled.  Patient is not taking any warfarin due to bleeding episodes and INR of 5.1 on September 7.  We will hold off on that for now.  Check PT/INR.  #5 morbid obesity: Dietary counseling.  #6 NASH cirrhosis: Continue to monitor.  LFTs appear to be okay.  #7 chronic kidney disease stage III: Continue to  monitor renal function.  #8 benign essential hypertension:  Confirm and resume home regimen.   DVT prophylaxis: SCD Code Status: Full code Family Communication: No family at bedside Disposition Plan: Home Consults called: None Admission status: Inpatient  Severity of Illness: The appropriate patient status for this patient is INPATIENT. Inpatient status is judged to be reasonable and necessary in order to provide the required intensity of service to ensure the patient's safety. The patient's presenting symptoms, physical exam findings, and initial radiographic and laboratory data in the context of their chronic comorbidities is felt to place them at high risk for further clinical deterioration. Furthermore, it is not anticipated that the patient will be medically stable for discharge from the hospital within 2 midnights of admission. The following factors support the patient status of inpatient.   " The patient's presenting symptoms include left foot wound on the leg has not shortness of breath. " The worrisome physical exam findings include anasarca with multiple wounds in the lower of legs. " The initial radiographic and laboratory data are worrisome because of evidence of fluid overload. " The chronic co-morbidities include A. fib and CHF.   * I certify that at the point of admission it is my clinical judgment that the patient will require inpatient hospital care spanning beyond 2 midnights from the point of admission due to high intensity of service, high risk for further deterioration and high frequency of surveillance required.Barbette Merino MD [facility] Pager [phone]  If 7PM-7AM, please contact night-coverage [email or web address] Password [facility]  10/08/2019, 11:41 PM

## 2019-10-08 NOTE — ED Triage Notes (Signed)
Pt here for sores to BLE.  Has open wound to posterior left lower leg and one to anterior right lower leg.  No purulent or infectious drainage. No fever.  Both legs have edema.  Pt is on diuretics.  + SHOB. + orthopnea.

## 2019-10-08 NOTE — ED Provider Notes (Signed)
[facility] Emergency Department Provider Note    First MD Initiated Contact with Patient 10/08/19 2218     (approximate)  I have reviewed the triage vital signs and the nursing notes.   HISTORY  Chief Complaint Wound Check    HPI Christian Berg is a 69 y.o. male below listed past medical history presented to ER for worsening orthopnea exertional shortness of breath worsening lower extremity edema and developing wounds to bilateral lower extremities.  No measured fevers but has had some chills.  States that he is roughly 25 pounds up on his typical weight despite increasing his home diuretics.  Denies any active chest pain.  Is having pain to bilateral lower extremities    Past Medical History:  Diagnosis Date  . A-fib ([place])   . Carotid stenosis   . CHF (congestive heart failure) ([place])   . Degenerative disc disease, lumbar    s/p injury  . Diabetes mellitus without complication ([place])   . Disseminated superficial actinic porokeratosis   . Dysrhythmia    A-FIB, palpatations  . 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 Fatty liver   . History of degenerative disc disease   . History of kidney stones   . Hypercholesteremia   . Hypertension   . Kidney stones   . Obesity   . Pacemaker    inactive  . Peripheral vascular disease ([place])    Carotid stenosis  . Presence of permanent cardiac pacemaker    Inactive  . TIA (transient ischemic attack) 2011   No deficits  . TIA (transient ischemic attack)   . Vitamin B12 deficiency    Family History  Adopted: Yes  Problem Relation Age of Onset  . Heart disease Mother   . Fibromyalgia Sister   . Hypertension Daughter   . Migraines Daughter   . Kidney disease Neg Hx   . Prostate cancer Neg Hx   . Bladder Cancer Neg Hx    Past Surgical History:  Procedure Laterality Date  . APPENDECTOMY  2004  . CARDIAC CATHETERIZATION    . CARDIAC PACEMAKER PLACEMENT  2005  . CATARACT EXTRACTION W/PHACO Right 06/14/2014    Procedure: CATARACT EXTRACTION PHACO AND INTRAOCULAR LENS PLACEMENT (IOC);  Surgeon: Leandrew Koyanagi, MD;  Location: [place];  Service: Ophthalmology;  Laterality: Right;  . CATARACT EXTRACTION W/PHACO Left 10/27/2018   Procedure: CATARACT EXTRACTION PHACO AND INTRAOCULAR LENS PLACEMENT (IOC) LEFT DIABETIC  00:46.4  18.6%  8.63;  Surgeon: Leandrew Koyanagi, MD;  Location: [place];  Service: Ophthalmology;  Laterality: Left;  Diabetic - oral meds  . CYSTOSCOPY W/ RETROGRADES Bilateral 08/09/2014   Procedure: CYSTOSCOPY WITH RETROGRADE PYELOGRAM;  Surgeon: Hollice Espy, MD;  Location: [facility];  Service: Urology;  Laterality: Bilateral;  . INTUBATION-ENDOTRACHEAL WITH TRACHEOSTOMY STANDBY  03/30/2017   Procedure: INTUBATION-ENDOTRACHEAL WITH TRACHEOSTOMY STANDBY;  Surgeon: Carloyn Manner, MD;  Location: [facility];  Service: ENT;;   Patient Active Problem List   Diagnosis Date Noted  . Acquired thrombophilia ([place])   . Chronic low back pain   . Chronic anticoagulation 08/04/2019  . CKD stage 3 due to type 2 diabetes mellitus ([place]) 08/04/2019  . Intractable diarrhea 08/04/2019  . Iron deficiency anemia 06/02/2019  . Mixed simple and mucopurulent chronic bronchitis ([place]) 05/31/2019  . Moderate tricuspid regurgitation by prior echocardiogram 12/16/2018  . NASH (nonalcoholic steatohepatitis) 10/22/2015  . History of embolic stroke 93/26/7124  . Morbid (severe) obesity due to excess calories ([place]) 11/22/2014  . VFD (visual field  defect) 11/22/2014  . History of TIA (transient ischemic attack) 10/31/2014  . Kidney stones 07/24/2014  . Other cirrhosis of liver ([place]) 07/24/2014  . DSAP (disseminated superficial actinic porokeratosis) 07/17/2014  . Asthma 05/31/2014  . (HFpEF) heart failure with preserved ejection fraction ([place]) 05/31/2014  . Diabetes mellitus ([place]) 05/31/2014  . Hypercholesteremia 05/31/2014  . B12 deficiency 05/31/2014  . Carotid artery narrowing  02/15/2014  . Bilateral carotid artery stenosis 02/15/2014  . Chronic atrial fibrillation ([place]) 10/24/2013  . Benign prostatic hyperplasia with urinary obstruction 09/06/2012  . Heart disease 05/29/2009  . Benign essential HTN 09/29/2008  . Narrowing of intervertebral disc space 01/11/2007      Prior to Admission medications   Medication Sig Start Date End Date Taking? Authorizing Provider  albuterol (VENTOLIN HFA) 108 (90 Base) MCG/ACT inhaler Inhale 2 puffs into the lungs every 6 (six) hours as needed for wheezing or shortness of breath. 05/31/19   Birdie Sons, MD  aspirin EC 81 MG tablet Take 81 mg by mouth every evening.     [provider]  cephALEXin (KEFLEX) 500 MG capsule Take 1 capsule (500 mg total) by mouth 4 (four) times daily for 10 days. 10/04/19 10/14/19  Birdie Sons, MD  ferrous sulfate 325 (65 FE) MG tablet Take 325 mg by mouth daily with breakfast.    [provider]  furosemide (LASIX) 20 MG tablet Take 1 tablet by mouth once daily Patient taking differently: Take 60 mg by mouth daily.  06/18/19   Birdie Sons, MD  ketoconazole (NIZORAL) 2 % cream Apply 1 application topically daily as needed for irritation. 08/06/19   Loletha Grayer, MD  lisinopril (ZESTRIL) 20 MG tablet Take 1 tablet (20 mg total) by mouth daily. 04/07/19   Birdie Sons, MD  lovastatin (MEVACOR) 20 MG tablet Take 1 tablet (20 mg total) by mouth every evening. 04/08/19   Birdie Sons, MD  metFORMIN (GLUCOPHAGE) 1000 MG tablet Take 1 tablet by mouth twice daily Patient taking differently: Take 500 mg by mouth 2 (two) times daily with a meal.  08/29/19   Birdie Sons, MD  metoprolol tartrate (LOPRESSOR) 100 MG tablet Take 1 tablet (100 mg total) by mouth at bedtime. Patient taking differently: Take 100 mg by mouth 2 (two) times daily.  10/24/16   Demetrios Loll, MD  nystatin cream (MYCOSTATIN) Apply 1 application topically 2 (two) times daily. 07/17/14   Zara Council A,  PA-C  traMADol-acetaminophen (ULTRACET) 37.5-325 MG tablet Take 1 tablet by mouth every 6 (six) hours as needed for severe pain. 08/06/19   Loletha Grayer, MD  warfarin (COUMADIN) 5 MG tablet Take 1/2 tablet daily or as directed by physician 10/04/19   Birdie Sons, MD    Allergies Clindamycin/lincomycin, Sulfa antibiotics, and Sulfasalazine    Social History Social History   Tobacco Use  . Smoking status: Never Smoker  . Smokeless tobacco: Never Used  Vaping Use  . Vaping Use: Never used  Substance Use Topics  . Alcohol use: No    Alcohol/week: 0.0 standard drinks  . Drug use: No    Review of Systems Patient denies headaches, rhinorrhea, blurry vision, numbness, shortness of breath, chest pain, edema, cough, abdominal pain, nausea, vomiting, diarrhea, dysuria, fevers, rashes or hallucinations unless otherwise stated above in HPI. ____________________________________________   PHYSICAL EXAM:  VITAL SIGNS: Vitals:   10/08/19 1651 10/08/19 2000  BP:  126/79  Pulse: 95 (!) 109  Resp: 20 20  Temp: 98.4  F (36.9 C) 98.5 F (36.9 C)  SpO2: 98% 97%    Constitutional: Alert and oriented.  Eyes: Conjunctivae are normal.  Head: Atraumatic. Nose: No congestion/rhinnorhea. Mouth/Throat: Mucous membranes are moist.   Neck: No stridor. Painless ROM.  Cardiovascular: Normal rate, regular rhythm. Grossly normal heart sounds.  Good peripheral circulation. Respiratory: Normal respiratory effort.  No retractions. Lungs with coarse bibasilar breath sonds Gastrointestinal: Soft and nontender. No distention. No abdominal bruits. No CVA tenderness. Genitourinary:  Musculoskeletal: Bilateral lower extremity edema with chronic venous stasis ulcerative changes no overlying warmth erythema no purulence.  No joint effusions. Neurologic:  Normal speech and language. No gross focal neurologic deficits are appreciated. No facial droop Skin:  Skin is warm, dry and intact. No rash  noted. Psychiatric: Mood and affect are normal. Speech and behavior are normal.  ____________________________________________   LABS (all labs ordered are listed, but only abnormal results are displayed)  Results for orders placed or performed during the hospital encounter of 10/08/19 (from the past 24 hour(s))  CBC     Status: Abnormal   Collection Time: 10/08/19  5:06 PM  Result Value Ref Range   WBC 9.0 4.0 - 10.5 K/uL   RBC 3.06 (L) 4.22 - 5.81 MIL/uL   Hemoglobin 9.3 (L) 13.0 - 17.0 g/dL   HCT 29.3 (L) 39 - 52 %   MCV 95.8 80.0 - 100.0 fL   MCH 30.4 26.0 - 34.0 pg   MCHC 31.7 30.0 - 36.0 g/dL   RDW 22.3 (H) 11.5 - 15.5 %   Platelets 235 150 - 400 K/uL   nRBC 0.0 0.0 - 0.2 %  Troponin I (High Sensitivity)     Status: Abnormal   Collection Time: 10/08/19  5:06 PM  Result Value Ref Range   Troponin I (High Sensitivity) 25 (H) <18 ng/L  Comprehensive metabolic panel     Status: Abnormal   Collection Time: 10/08/19  5:06 PM  Result Value Ref Range   Sodium 138 135 - 145 mmol/L   Potassium 3.5 3.5 - 5.1 mmol/L   Chloride 100 98 - 111 mmol/L   CO2 26 22 - 32 mmol/L   Glucose, Bld 145 (H) 70 - 99 mg/dL   BUN 14 8 - 23 mg/dL   Creatinine, Ser 1.38 (H) 0.61 - 1.24 mg/dL   Calcium 8.4 (L) 8.9 - 10.3 mg/dL   Total Protein 7.3 6.5 - 8.1 g/dL   Albumin 2.7 (L) 3.5 - 5.0 g/dL   AST 51 (H) 15 - 41 U/L   ALT 19 0 - 44 U/L   Alkaline Phosphatase 121 38 - 126 U/L   Total Bilirubin 2.0 (H) 0.3 - 1.2 mg/dL   GFR calc non Af Amer 51 (L) >60 mL/min   GFR calc Af Amer 60 (L) >60 mL/min   Anion gap 12 5 - 15  Brain natriuretic peptide     Status: Abnormal   Collection Time: 10/08/19  5:06 PM  Result Value Ref Range   B Natriuretic Peptide 399.1 (H) 0.0 - 100.0 pg/mL  Troponin I (High Sensitivity)     Status: Abnormal   Collection Time: 10/08/19  8:00 PM  Result Value Ref Range   Troponin I (High Sensitivity) 26 (H) <18 ng/L   ____________________________________________  EKG My  review and personal interpretation at Time: 17:01   Indication: swelling  Rate: 110  Rhythm: afib Axis: normal Other: nonspecific st abn, no stemi ____________________________________________  RADIOLOGY  I personally reviewed all radiographic images  ordered to evaluate for the above acute complaints and reviewed radiology reports and findings.  These findings were personally discussed with the patient.  Please see medical record for radiology report.  ____________________________________________   PROCEDURES  Procedure(s) performed:  Procedures    Critical Care performed: no ____________________________________________   INITIAL IMPRESSION / ASSESSMENT AND PLAN / ED COURSE  Pertinent labs & imaging results that were available during my care of the patient were reviewed by me and considered in my medical decision making (see chart for details).   DDX: Cellulitis, osteo-, venous stasis, CHF, volume overload  Bunnie Lederman Eppinger is a 69 y.o. who presents to the ED with presentation concerning for volume overload and acute on chronic CHF.  Is not hypoxic but does have pulmonary edema and significant volume overload that will require admission for IV Lasix and aggressive diuresis.  Does not appear septic.  Has venous stasis ulcerative changes but does not appear acutely cellulitic.  Have discussed with the patient and available family all diagnostics and treatments performed thus far and all questions were answered to the best of my ability. The patient demonstrates understanding and agreement with plan.      The patient was evaluated in Emergency Department today for the symptoms described in the history of present illness. He/she was evaluated in the context of the global COVID-19 pandemic, which necessitated consideration that the patient might be at risk for infection with the SARS-CoV-2 virus that causes COVID-19. Institutional protocols and algorithms that pertain to the evaluation of  patients at risk for COVID-19 are in a state of rapid change based on information released by regulatory bodies including the CDC and federal and state organizations. These policies and algorithms were followed during the patient's care in the ED.  As part of my medical decision making, I reviewed the following data within the [place] notes reviewed and incorporated, Labs reviewed, notes from prior ED visits and [place] Controlled Substance Database   ____________________________________________   FINAL CLINICAL IMPRESSION(S) / ED DIAGNOSES  Final diagnoses:  Acute on chronic congestive heart failure, unspecified heart failure type ([place])      NEW MEDICATIONS STARTED DURING THIS VISIT:  New Prescriptions   No medications on file     Note:  This document was prepared using Dragon voice recognition software and may include unintentional dictation errors.    Merlyn Lot, MD 10/08/19 2239

## 2019-10-09 DIAGNOSIS — S81802A Unspecified open wound, left lower leg, initial encounter: Secondary | ICD-10-CM

## 2019-10-09 LAB — CBC WITH DIFFERENTIAL/PLATELET
Abs Immature Granulocytes: 0.05 10*3/uL (ref 0.00–0.07)
Basophils Absolute: 0 10*3/uL (ref 0.0–0.1)
Basophils Relative: 0 %
Eosinophils Absolute: 0.1 10*3/uL (ref 0.0–0.5)
Eosinophils Relative: 1 %
HCT: 27.2 % — ABNORMAL LOW (ref 39.0–52.0)
Hemoglobin: 8.7 g/dL — ABNORMAL LOW (ref 13.0–17.0)
Immature Granulocytes: 1 %
Lymphocytes Relative: 11 %
Lymphs Abs: 1.1 10*3/uL (ref 0.7–4.0)
MCH: 30.4 pg (ref 26.0–34.0)
MCHC: 32 g/dL (ref 30.0–36.0)
MCV: 95.1 fL (ref 80.0–100.0)
Monocytes Absolute: 0.8 10*3/uL (ref 0.1–1.0)
Monocytes Relative: 8 %
Neutro Abs: 7.8 10*3/uL — ABNORMAL HIGH (ref 1.7–7.7)
Neutrophils Relative %: 79 %
Platelets: 216 10*3/uL (ref 150–400)
RBC: 2.86 MIL/uL — ABNORMAL LOW (ref 4.22–5.81)
RDW: 22.4 % — ABNORMAL HIGH (ref 11.5–15.5)
Smear Review: NORMAL
WBC: 9.8 10*3/uL (ref 4.0–10.5)
nRBC: 0.2 % (ref 0.0–0.2)

## 2019-10-09 LAB — GLUCOSE, CAPILLARY
Glucose-Capillary: 120 mg/dL — ABNORMAL HIGH (ref 70–99)
Glucose-Capillary: 139 mg/dL — ABNORMAL HIGH (ref 70–99)
Glucose-Capillary: 141 mg/dL — ABNORMAL HIGH (ref 70–99)
Glucose-Capillary: 146 mg/dL — ABNORMAL HIGH (ref 70–99)

## 2019-10-09 LAB — BASIC METABOLIC PANEL
Anion gap: 13 (ref 5–15)
BUN: 15 mg/dL (ref 8–23)
CO2: 25 mmol/L (ref 22–32)
Calcium: 8.5 mg/dL — ABNORMAL LOW (ref 8.9–10.3)
Chloride: 99 mmol/L (ref 98–111)
Creatinine, Ser: 1.41 mg/dL — ABNORMAL HIGH (ref 0.61–1.24)
GFR calc Af Amer: 58 mL/min — ABNORMAL LOW (ref >60)
GFR calc non Af Amer: 50 mL/min — ABNORMAL LOW (ref >60)
Glucose, Bld: 150 mg/dL — ABNORMAL HIGH (ref 70–99)
Potassium: 3.6 mmol/L (ref 3.5–5.1)
Sodium: 137 mmol/L (ref 135–145)

## 2019-10-09 LAB — IRON AND TIBC
Iron: 39 ug/dL — ABNORMAL LOW (ref 45–182)
Saturation Ratios: 14 % — ABNORMAL LOW (ref 17.9–39.5)
TIBC: 274 ug/dL (ref 250–450)
UIBC: 235 ug/dL

## 2019-10-09 LAB — SARS CORONAVIRUS 2 BY RT PCR (HOSPITAL ORDER, PERFORMED IN ~~LOC~~ HOSPITAL LAB): SARS Coronavirus 2: NEGATIVE

## 2019-10-09 LAB — HEMOGLOBIN A1C
Hgb A1c MFr Bld: 5.7 % — ABNORMAL HIGH (ref 4.8–5.6)
Mean Plasma Glucose: 116.89 mg/dL

## 2019-10-09 MED ORDER — SODIUM CHLORIDE 0.9% FLUSH
3.0000 mL | INTRAVENOUS | Status: DC | PRN
  Administered 2019-10-14: 3 mL via INTRAVENOUS

## 2019-10-09 MED ORDER — ACETAMINOPHEN 325 MG PO TABS
650.0000 mg | ORAL_TABLET | ORAL | Status: DC | PRN
  Administered 2019-10-09 – 2019-10-11 (×2): 650 mg via ORAL
  Filled 2019-10-09 (×4): qty 2

## 2019-10-09 MED ORDER — INSULIN ASPART 100 UNIT/ML ~~LOC~~ SOLN
0.0000 [IU] | Freq: Three times a day (TID) | SUBCUTANEOUS | Status: DC
  Administered 2019-10-09: 2 [IU] via SUBCUTANEOUS
  Administered 2019-10-10: 3 [IU] via SUBCUTANEOUS
  Administered 2019-10-10 – 2019-10-14 (×11): 2 [IU] via SUBCUTANEOUS
  Administered 2019-10-14: 5 [IU] via SUBCUTANEOUS
  Administered 2019-10-14 – 2019-10-18 (×12): 3 [IU] via SUBCUTANEOUS
  Administered 2019-10-18: 2 [IU] via SUBCUTANEOUS
  Administered 2019-10-19: 3 [IU] via SUBCUTANEOUS
  Administered 2019-10-19: 8 [IU] via SUBCUTANEOUS
  Administered 2019-10-20 (×2): 5 [IU] via SUBCUTANEOUS
  Administered 2019-10-20: 2 [IU] via SUBCUTANEOUS
  Administered 2019-10-20: 3 [IU] via SUBCUTANEOUS
  Administered 2019-10-21: 5 [IU] via SUBCUTANEOUS
  Administered 2019-10-21: 3 [IU] via SUBCUTANEOUS
  Administered 2019-10-21 – 2019-10-22 (×2): 2 [IU] via SUBCUTANEOUS
  Administered 2019-10-22: 3 [IU] via SUBCUTANEOUS
  Administered 2019-10-22: 5 [IU] via SUBCUTANEOUS
  Administered 2019-10-23 (×2): 2 [IU] via SUBCUTANEOUS
  Filled 2019-10-09 (×42): qty 1

## 2019-10-09 MED ORDER — ENOXAPARIN SODIUM 40 MG/0.4ML ~~LOC~~ SOLN
40.0000 mg | Freq: Two times a day (BID) | SUBCUTANEOUS | Status: DC
  Administered 2019-10-09 – 2019-10-11 (×6): 40 mg via SUBCUTANEOUS
  Filled 2019-10-09 (×6): qty 0.4

## 2019-10-09 MED ORDER — LISINOPRIL 10 MG PO TABS
20.0000 mg | ORAL_TABLET | Freq: Every day | ORAL | Status: DC
  Administered 2019-10-09: 20 mg via ORAL
  Filled 2019-10-09 (×2): qty 2

## 2019-10-09 MED ORDER — FUROSEMIDE 10 MG/ML IJ SOLN
40.0000 mg | Freq: Two times a day (BID) | INTRAMUSCULAR | Status: DC
  Administered 2019-10-09: 40 mg via INTRAVENOUS
  Filled 2019-10-09: qty 4

## 2019-10-09 MED ORDER — PRAVASTATIN SODIUM 20 MG PO TABS
20.0000 mg | ORAL_TABLET | Freq: Every day | ORAL | Status: DC
  Administered 2019-10-09 – 2019-10-22 (×14): 20 mg via ORAL
  Filled 2019-10-09 (×15): qty 1

## 2019-10-09 MED ORDER — POTASSIUM CHLORIDE CRYS ER 20 MEQ PO TBCR
40.0000 meq | EXTENDED_RELEASE_TABLET | Freq: Once | ORAL | Status: AC
  Administered 2019-10-09: 40 meq via ORAL
  Filled 2019-10-09: qty 2

## 2019-10-09 MED ORDER — METOPROLOL TARTRATE 50 MG PO TABS
100.0000 mg | ORAL_TABLET | Freq: Two times a day (BID) | ORAL | Status: DC
  Administered 2019-10-09 – 2019-10-16 (×15): 100 mg via ORAL
  Filled 2019-10-09 (×15): qty 2

## 2019-10-09 MED ORDER — SODIUM CHLORIDE 0.9% FLUSH
3.0000 mL | Freq: Two times a day (BID) | INTRAVENOUS | Status: DC
  Administered 2019-10-09 – 2019-10-23 (×23): 3 mL via INTRAVENOUS

## 2019-10-09 MED ORDER — ALBUTEROL SULFATE (2.5 MG/3ML) 0.083% IN NEBU
3.0000 mL | INHALATION_SOLUTION | Freq: Four times a day (QID) | RESPIRATORY_TRACT | Status: DC | PRN
  Filled 2019-10-09: qty 3

## 2019-10-09 MED ORDER — SODIUM CHLORIDE 0.9 % IV SOLN: 250.0000 mL | INTRAVENOUS | Status: DC | PRN

## 2019-10-09 MED ORDER — FUROSEMIDE 10 MG/ML IJ SOLN
8.0000 mg/h | INTRAVENOUS | Status: DC
  Administered 2019-10-09: 4 mg/h via INTRAVENOUS
  Administered 2019-10-10 – 2019-10-15 (×7): 8 mg/h via INTRAVENOUS
  Filled 2019-10-09 (×6): qty 25

## 2019-10-09 MED ORDER — ONDANSETRON HCL 4 MG/2ML IJ SOLN: 4.0000 mg | Freq: Four times a day (QID) | INTRAMUSCULAR | Status: DC | PRN

## 2019-10-09 MED ORDER — ASPIRIN EC 81 MG PO TBEC
81.0000 mg | DELAYED_RELEASE_TABLET | Freq: Every evening | ORAL | Status: DC
  Administered 2019-10-09 – 2019-10-11 (×3): 81 mg via ORAL
  Filled 2019-10-09 (×3): qty 1

## 2019-10-09 NOTE — TOC Initial Note (Signed)
Transition of Care [facility]) - Initial/Assessment Note    Patient Details  Name: Christian Berg MRN: [id number] Date of Birth: [birth year]  [facility] CM/SW Contact:    Ova Freshwater Phone Number: [phone] 10/09/2019, 11:10 AM  Clinical Narrative:                  CSW contacted patient's spouse [name] [phone], for collateral information and left voicemail.       Patient Goals and CMS Choice        Expected Discharge Plan and Services                                                Prior Living Arrangements/Services                       Activities of Daily Living      Permission Sought/Granted                  Emotional Assessment              Admission diagnosis:  Acute exacerbation of CHF (congestive heart failure) ([place]) [I50.9] Patient Active Problem List   Diagnosis Date Noted   Acute exacerbation of CHF (congestive heart failure) ([place]) 10/08/2019   Leg wound, left, initial encounter 10/08/2019   Acquired thrombophilia ([place])    Chronic low back pain    Chronic anticoagulation 08/04/2019   CKD stage 3 due to type 2 diabetes mellitus ([place]) 08/04/2019   Intractable diarrhea 08/04/2019   Iron deficiency anemia 06/02/2019   Mixed simple and mucopurulent chronic bronchitis ([place]) 05/31/2019   Moderate tricuspid regurgitation by prior echocardiogram 12/16/2018   NASH (nonalcoholic steatohepatitis) 10/22/2015   History of embolic stroke 97/35/3299   Morbid (severe) obesity due to excess calories ([place]) 11/22/2014   VFD (visual field defect) 11/22/2014   History of TIA (transient ischemic attack) 10/31/2014   Kidney stones 07/24/2014   Other cirrhosis of liver ([place]) 07/24/2014   DSAP (disseminated superficial actinic porokeratosis) 07/17/2014   Asthma 05/31/2014   (HFpEF) heart failure with preserved ejection fraction ([place]) 05/31/2014   Diabetes mellitus ([place]) 05/31/2014    Hypercholesteremia 05/31/2014   B12 deficiency 05/31/2014   Carotid artery narrowing 02/15/2014   Bilateral carotid artery stenosis 02/15/2014   Chronic atrial fibrillation ([place]) 10/24/2013   Benign prostatic hyperplasia with urinary obstruction 09/06/2012   Heart disease 05/29/2009   Benign essential HTN 09/29/2008   Narrowing of intervertebral disc space 01/11/2007   PCP:  Birdie Sons, MD Pharmacy:   [facility] [address], [place] - [place] [address] [place] [address] Phone: [phone] Fax: [phone]     Social Determinants of Health (SDOH) Interventions    Readmission Risk Interventions No flowsheet data found.

## 2019-10-09 NOTE — ED Notes (Signed)
Pt 2 assist total, and placed on bedpan per request. Pt unable to have a bowel movement.

## 2019-10-09 NOTE — ED Notes (Signed)
Pt st he needs to have a bowel movement quickly. Pt place onto bedpan by RN Miquel Dunn & RN Raquel Sarna.

## 2019-10-09 NOTE — Progress Notes (Signed)
PHARMACIST - PHYSICIAN COMMUNICATION  CONCERNING:  Enoxaparin (Lovenox) for DVT Prophylaxis    RECOMMENDATION: Patient was prescribed enoxaprin 67m q24 hours for VTE prophylaxis.   Filed Weights   10/08/19 1652  Weight: 136.1 kg (300 lb)    Body mass index is 45.61 kg/m.  Estimated Creatinine Clearance: 65.8 mL/min (A) (by C-G formula based on SCr of 1.41 mg/dL (H)).  Based on [place] candidate for enoxaparin 435mevery 12 hours due to BMI being >40.  DESCRIPTION: Pharmacy has [facility]  Patient is now receiving enoxaparin 40 mg every 12 hours   MoBenn MoulderPharmD Pharmacy Resident  10/09/2019 1:51 PM

## 2019-10-09 NOTE — Progress Notes (Signed)
PROGRESS NOTE    BRODRIC SCHAUER  WUJ:[id number] DOB: 1950-11-22 DOA: 10/08/2019 PCP: Birdie Sons, MD   Chief complaint.  Short of breath on the left leg pain. Brief Narrative:  BUCKLEY BRADLY is a 69 y.o. male with medical history significant of diabetes, hypertension, diastolic dysfunction CHF, disseminated superficial actinic porokeratosis, degenerative disc disease, recent bleeding episodes, atrial fibrillation and peripheral vascular disease who presented to the ER with left lower extremity ulcer. Patient has significant debility at baseline, he can only walk a few steps with a walker.Patient recently has been taken off warfarin due to significant skin bleedings.  He has short of breath with exertion at baseline, he has a frequent paroxysmal nocturnal dyspnea.  He has gained 25 to 30 pounds of body weight recently.  He also has worsening leg edema. Upon arriving the emergency room, his chest x-ray showed evidence of fluid overload.  He does not have hypoxemia.  He was started on IV Lasix for exacerbation of congestive heart failure.  Wound care was also consulted for left lower extremity wound, cefepime was started for infection.  9/12.  Diuretic changed to Lasix drip due to patient severe anasarca.   Assessment & Plan:   Principal Problem:   (HFpEF) heart failure with preserved ejection fraction (HCC) Active Problems:   Diabetes mellitus ([place])   Benign essential HTN   Benign prostatic hyperplasia with urinary obstruction   Chronic atrial fibrillation (HCC)   Morbid (severe) obesity due to excess calories (HCC)   NASH (nonalcoholic steatohepatitis)   Bilateral carotid artery stenosis   CKD stage 3 due to type 2 diabetes mellitus (HCC)   Acute exacerbation of CHF (congestive heart failure) (HCC)   Leg wound, left, initial encounter  #1.  Acute on chronic diastolic congestive heart failure. Patient presented with evidence of bichamber congestive heart failure.  Severe volume  overload with anasarca. Reviewed patient most recent echocardiogram performed in 08/05/2019, ejection fraction was normal.  No significant pulmonary hypertension. Due to severe anasarca, I will change diuretics to Lasix IV drip. Monitor electrolytes and renal function.  2.  Left leg wound with infection. Patient has a bilateral leg edema appears to be secondary to venous stasis and congestive heart failure.  Posterior left leg also has ulcer, appears to be infected.  Wound care has been consulted.  Cefepime started.  We will monitor this closely, will change antibiotics if condition does not improve quickly.  3.  Chronic atrial fibrillation with rapid ventricular response. Patient was taken off of warfarin recently due to concern of skin bleeding.  Patient has some tachycardia, continue beta-blocker.  4.  Type 2 diabetes. Hold off Metformin, continue sliding scale insulin.  5.  NASH liver cirrhosis. Continue to monitor.  6.  Chronic kidney disease stage IIIa. Renal function still stable.  Follow.  7.  Central hypertension. Continue home medicines per  8.  Morbid obesity.  9.  Anemia. Check iron B12 level.    DVT prophylaxis: Lovenox Code Status: Full Family Communication: Wife updated.  .   Status is: Inpatient  Remains inpatient appropriate because:Inpatient level of care appropriate due to severity of illness   Dispo: The patient is from: Home              Anticipated d/c is to: Home              Anticipated d/c date is: 3 days              Patient currently  is not medically stable to d/c.        No intake/output data recorded. No intake/output data recorded.     Consultants:   None  Procedures: None  Antimicrobials: Cefepime.  Subjective: Patient still complaining short of breath with minimal exertion.  Leg pain. No nausea vomiting abdominal pain.  No diarrhea. Subjective fever, no chills. No dysuria hematuria.  Objective: Vitals:   10/09/19  0330 10/09/19 0430 10/09/19 0809 10/09/19 1142  BP: (!) 108/53 133/75 (!) 114/57 121/62  Pulse: (!) 120 (!) 119 (!) 106 (!) 114  Resp: (!) 25 (!) 25 17 17   Temp:      TempSrc:      SpO2: 98% 100% 95% 97%  Weight:      Height:       No intake or output data in the 24 hours ending 10/09/19 1333 Filed Weights   10/08/19 1652  Weight: 136.1 kg    Examination:  General exam: Appears calm and comfortable  Respiratory system: Crackles in the base bilaterally. Respiratory effort normal. Cardiovascular system: Irregularly irregular with tachycardia, no JVD, murmurs, rubs, gallops or clicks. Gastrointestinal system: Abdomen is nondistended, soft and nontender. No organomegaly or masses felt. Normal bowel sounds heard. Central nervous system: Alert and oriented. No focal neurological deficits. Extremities: Bilateral lower extremity 3+ edema, left leg has ulcer posteriorly with draining.  Psychiatry:  Mood & affect appropriate.     Data Reviewed: I have personally reviewed following labs and imaging studies  CBC: Recent Labs  Lab 10/04/19 1149 10/08/19 1706 10/09/19 0801  WBC 5.1 9.0 9.8  NEUTROABS  --   --  7.8*  HGB 9.3* 9.3* 8.7*  HCT 30.2* 29.3* 27.2*  MCV 93 95.8 95.1  PLT 224 235 332   Basic Metabolic Panel: Recent Labs  Lab 10/04/19 1149 10/08/19 1706 10/09/19 0801  NA 140 138 137  K 3.7 3.5 3.6  CL 101 100 99  CO2 25 26 25   GLUCOSE 147* 145* 150*  BUN 11 14 15   CREATININE 1.30* 1.38* 1.41*  CALCIUM 8.9 8.4* 8.5*   GFR: Estimated Creatinine Clearance: 65.8 mL/min (A) (by C-G formula based on SCr of 1.41 mg/dL (H)). Liver Function Tests: Recent Labs  Lab 10/04/19 1149 10/08/19 1706  AST 34 51*  ALT 15 19  ALKPHOS 164* 121  BILITOT 1.0 2.0*  PROT 6.6 7.3  ALBUMIN 2.8* 2.7*   No results for input(s): LIPASE, AMYLASE in the last 168 hours. No results for input(s): AMMONIA in the last 168 hours. Coagulation Profile: Recent Labs  Lab 10/04/19 1156    INR 5.1*   Cardiac Enzymes: No results for input(s): CKTOTAL, CKMB, CKMBINDEX, TROPONINI in the last 168 hours. BNP (last 3 results) No results for input(s): PROBNP in the last 8760 hours. HbA1C: Recent Labs    10/09/19 0801  HGBA1C 5.7*   CBG: Recent Labs  Lab 10/09/19 0000 10/09/19 0800 10/09/19 1133  GLUCAP 120* 146* 139*   Lipid Profile: No results for input(s): CHOL, HDL, LDLCALC, TRIG, CHOLHDL, LDLDIRECT in the last 72 hours. Thyroid Function Tests: No results for input(s): TSH, T4TOTAL, FREET4, T3FREE, THYROIDAB in the last 72 hours. Anemia Panel: No results for input(s): VITAMINB12, FOLATE, FERRITIN, TIBC, IRON, RETICCTPCT in the last 72 hours. Sepsis Labs: No results for input(s): PROCALCITON, LATICACIDVEN in the last 168 hours.  Recent Results (from the past 240 hour(s))  SARS Coronavirus 2 by RT PCR (hospital order, performed in [facility] hospital lab) Nasopharyngeal Nasopharyngeal Swab  Status: None   Collection Time: 10/09/19 12:02 AM   Specimen: Nasopharyngeal Swab  Result Value Ref Range Status   SARS Coronavirus 2 NEGATIVE NEGATIVE Final    Comment: (NOTE) SARS-CoV-2 target nucleic acids are NOT DETECTED.  The SARS-CoV-2 RNA is generally detectable in upper and lower respiratory specimens during the acute phase of infection. The lowest concentration of SARS-CoV-2 viral copies this assay can detect is 250 copies / mL. A negative result does not preclude SARS-CoV-2 infection and should not be used as the sole basis for treatment or other patient management decisions.  A negative result may occur with improper specimen collection / handling, submission of specimen other than nasopharyngeal swab, presence of viral mutation(s) within the areas targeted by this assay, and inadequate number of viral copies (<250 copies / mL). A negative result must be combined with clinical observations, patient history, and epidemiological information.  Fact Sheet  for Patients:   StrictlyIdeas.no  Fact Sheet for Healthcare Providers: BankingDealers.co.za  This test is not yet approved or  cleared by the [place] FDA and has been authorized for detection and/or diagnosis of SARS-CoV-2 by FDA under an Emergency Use Authorization (EUA).  This EUA will remain in effect (meaning this test can be used) for the duration of the COVID-19 declaration under Section 564(b)(1) of the Act, 21 U.S.C. section 360bbb-3(b)(1), unless the authorization is terminated or revoked sooner.  Performed at [facility], [address] [place], [place] [address]          Radiology Studies: DG Chest 2 View  Result Date: 10/08/2019 CLINICAL DATA:  Shortness of breath, open wounds to the posterior left lower leg and anterior right lower leg, history of asthma EXAM: CHEST - 2 VIEW COMPARISON:  Radiograph 08/08/2019, CT 09/15/2015 FINDINGS: Diffuse hazy basilar predominant interstitial opacities with some fissural thickening as well. More patchy retrocardiac infrahilar opacities in the lung bases. No pneumothorax or visible effusion. Prominent cardiac silhouette compatible with cardiomegaly seen on comparison studies. Pacer pack overlies the left chest wall with leads at the cardiac apex and right atrium in similar positioning to prior. Remaining cardiomediastinal contours are unremarkable. No acute osseous or soft tissue abnormality. Degenerative changes are present in the imaged spine and shoulders. Prior left humeral ORIF is noted. IMPRESSION: 1. Features of CHF/volume overload with cardiomegaly and interstitial opacities suggesting edema. 2. More patchy retrocardiac and infrahilar opacities in the lung bases may reflect developing alveolar edema, atelectasis or airspace disease. Electronically Signed   By: Lovena Le M.D.   On: 10/08/2019 17:36   DG Tibia/Fibula Left  Result Date: 10/08/2019 CLINICAL DATA:  Open  wound to posterior left lower leg. EXAM: LEFT TIBIA AND FIBULA - 2 VIEW COMPARISON:  None. FINDINGS: There is no evidence of fracture or other focal bone lesions. Mild diffuse soft tissue swelling is seen throughout the left calf with moderate severity soft tissue swelling also noted along the anterior aspect of the left tibia. No soft tissue air is seen. IMPRESSION: 1. Soft tissue swelling which may be secondary to diffuse cellulitis, without evidence of an acute osseous abnormality. Electronically Signed   By: Virgina Norfolk M.D.   On: 10/08/2019 19:49        Scheduled Meds: . aspirin EC  81 mg Oral QPM  . enoxaparin (LOVENOX) injection  40 mg Subcutaneous Q24H  . insulin aspart  0-15 Units Subcutaneous TID WC  . lisinopril  20 mg Oral Daily  . metoprolol tartrate  100 mg Oral  BID  . pravastatin  20 mg Oral q1800  . sodium chloride flush  3 mL Intravenous Q12H   Continuous Infusions: . sodium chloride    . ceFEPime (MAXIPIME) IV 2 g (10/09/19 1143)  . furosemide (LASIX) infusion       LOS: 1 day    Time spent: 37 minutes    Sharen Hones, MD [facility]   To contact the attending provider between 7A-7P or the covering provider during after hours 7P-7A, please log into the web site [email or web address] and access using universal [place] password for that web site. If you do not have the password, please call the hospital operator.  10/09/2019, 1:33 PM

## 2019-10-09 NOTE — Progress Notes (Signed)
PT Cancellation Note  Patient Details Name: STIVEN KASPAR MRN: [id number] DOB: 09/12/1950   Cancelled Treatment:    Reason Eval/Treat Not Completed: Patient's level of consciousness. PT entered patient's room. Upon entry lights off, patient was fast asleep. Will re-attempt when patient is more alert and able to participate in therapy.   Royce Macadamia PT, DPT, CSCS    10/09/2019, 2:55 PM

## 2019-10-10 ENCOUNTER — Encounter: Payer: Self-pay | Admitting: Internal Medicine

## 2019-10-10 DIAGNOSIS — N401 Enlarged prostate with lower urinary tract symptoms: Secondary | ICD-10-CM

## 2019-10-10 DIAGNOSIS — N138 Other obstructive and reflux uropathy: Secondary | ICD-10-CM

## 2019-10-10 LAB — CBC WITH DIFFERENTIAL/PLATELET
Abs Immature Granulocytes: 0.04 10*3/uL (ref 0.00–0.07)
Basophils Absolute: 0 10*3/uL (ref 0.0–0.1)
Basophils Relative: 0 %
Eosinophils Absolute: 0.2 10*3/uL (ref 0.0–0.5)
Eosinophils Relative: 2 %
HCT: 29 % — ABNORMAL LOW (ref 39.0–52.0)
Hemoglobin: 9.7 g/dL — ABNORMAL LOW (ref 13.0–17.0)
Immature Granulocytes: 0 %
Lymphocytes Relative: 11 %
Lymphs Abs: 1.1 10*3/uL (ref 0.7–4.0)
MCH: 31.1 pg (ref 26.0–34.0)
MCHC: 33.4 g/dL (ref 30.0–36.0)
MCV: 92.9 fL (ref 80.0–100.0)
Monocytes Absolute: 1.2 10*3/uL — ABNORMAL HIGH (ref 0.1–1.0)
Monocytes Relative: 12 %
Neutro Abs: 7.5 10*3/uL (ref 1.7–7.7)
Neutrophils Relative %: 75 %
Platelets: 205 10*3/uL (ref 150–400)
RBC: 3.12 MIL/uL — ABNORMAL LOW (ref 4.22–5.81)
RDW: 22.3 % — ABNORMAL HIGH (ref 11.5–15.5)
Smear Review: NORMAL
WBC: 9.9 10*3/uL (ref 4.0–10.5)
nRBC: 0 % (ref 0.0–0.2)

## 2019-10-10 LAB — BASIC METABOLIC PANEL
Anion gap: 11 (ref 5–15)
BUN: 18 mg/dL (ref 8–23)
CO2: 28 mmol/L (ref 22–32)
Calcium: 8.5 mg/dL — ABNORMAL LOW (ref 8.9–10.3)
Chloride: 98 mmol/L (ref 98–111)
Creatinine, Ser: 1.53 mg/dL — ABNORMAL HIGH (ref 0.61–1.24)
GFR calc Af Amer: 53 mL/min — ABNORMAL LOW (ref >60)
GFR calc non Af Amer: 45 mL/min — ABNORMAL LOW (ref >60)
Glucose, Bld: 145 mg/dL — ABNORMAL HIGH (ref 70–99)
Potassium: 3.7 mmol/L (ref 3.5–5.1)
Sodium: 137 mmol/L (ref 135–145)

## 2019-10-10 LAB — GLUCOSE, CAPILLARY
Glucose-Capillary: 125 mg/dL — ABNORMAL HIGH (ref 70–99)
Glucose-Capillary: 139 mg/dL — ABNORMAL HIGH (ref 70–99)
Glucose-Capillary: 155 mg/dL — ABNORMAL HIGH (ref 70–99)
Glucose-Capillary: 154 mg/dL — ABNORMAL HIGH (ref 70–99)

## 2019-10-10 LAB — MAGNESIUM: Magnesium: 1.7 mg/dL (ref 1.7–2.4)

## 2019-10-10 LAB — VITAMIN B12: Vitamin B-12: 466 pg/mL (ref 180–914)

## 2019-10-10 MED ORDER — SODIUM CHLORIDE 0.9 % IV SOLN
2.0000 g | Freq: Three times a day (TID) | INTRAVENOUS | Status: DC
  Administered 2019-10-10 – 2019-10-12 (×5): 2 g via INTRAVENOUS
  Filled 2019-10-10 (×8): qty 2

## 2019-10-10 MED ORDER — SODIUM CHLORIDE 0.9 % IV SOLN: 2.0000 g | Freq: Three times a day (TID) | INTRAVENOUS | Status: DC

## 2019-10-10 NOTE — ED Notes (Signed)
Pt called out upset at staff for the delay in answering the call bell. When asked what was needed pt st " I was wondering if yall had some candy I could eat". Pt informed that we do not have candy and that he is a diabetic. Pt st "but my sugar might be low".  This RN informed the pt that he was receiving a dextrose infusion and that, that would assist his sugar levels. Pt upset at this RN regarding the answer given and stated "you dont need to come back in my room, if I call out dont worry about it"

## 2019-10-10 NOTE — Progress Notes (Signed)
PROGRESS NOTE    MISTER KRAHENBUHL  XNA:[id number] DOB: [birth year] DOA: 10/08/2019 PCP: Birdie Sons, MD   Chief complaint.  Short of breath on the left leg pain. Brief Narrative:  Christian Berg is a 69 y.o. male with medical history significant of diabetes, hypertension, diastolic dysfunction CHF, disseminated superficial actinic porokeratosis, degenerative disc disease, recent bleeding episodes, atrial fibrillation and peripheral vascular disease who presented to the ER with left lower extremity ulcer. Patient has significant debility at baseline, he can only walk a few steps with a walker.Patient recently has been taken off warfarin due to significant skin bleedings.  He has short of breath with exertion at baseline, he has a frequent paroxysmal nocturnal dyspnea.  He has gained 25 to 30 pounds of body weight recently.  He also has worsening leg edema. Upon arriving the emergency room, his chest x-ray showed evidence of fluid overload.  He does not have hypoxemia.  He was started on IV Lasix for exacerbation of congestive heart failure.  Wound care was also consulted for left lower extremity wound, cefepime was started for infection.     Assessment & Plan:   Principal Problem:   (HFpEF) heart failure with preserved ejection fraction (HCC) Active Problems:   Diabetes mellitus ([place])   Benign essential HTN   Benign prostatic hyperplasia with urinary obstruction   Chronic atrial fibrillation (HCC)   Morbid (severe) obesity due to excess calories (HCC)   NASH (nonalcoholic steatohepatitis)   Bilateral carotid artery stenosis   CKD stage 3 due to type 2 diabetes mellitus (HCC)   Acute exacerbation of CHF (congestive heart failure) (HCC)   Leg wound, left, initial encounter  #1.  Acute on chronic diastolic congestive heart failure. Patient presented with evidence of bichamber congestive heart failure.  Severe volume overload with anasarca. Reviewed patient most recent echocardiogram  performed in 08/05/2019, ejection fraction was normal.  No significant pulmonary hypertension. Currently on lasix infusion with fair urine output Continues to have evidence of volume overload. Monitor electrolytes and renal function.  2.  Left leg wound with infection. Patient has a bilateral leg edema appears to be secondary to venous stasis and congestive heart failure.  Posterior left leg also has ulcer, appears to be infected.  Wound care has been consulted.  Continue on Cefepime.  We will monitor this closely, will change antibiotics if condition does not improve quickly.  3.  Chronic atrial fibrillation with rapid ventricular response. Patient was taken off of warfarin recently due to concern of skin bleeding.  Patient has some tachycardia, continue beta-blocker. -INR prior to admission was >5 -recheck INR and if less than 2 will restart coumadin.   4.  Type 2 diabetes. Hold off Metformin, continue sliding scale insulin.  5.  NASH liver cirrhosis. Continue to monitor.  6.  Chronic kidney disease stage IIIa. Continue to follow renal function in the setting of diuresis.  7.  Essential hypertension. Continue on metoprolol  8.  Morbid obesity.  9.  Anemia. -B12 466 -Iron 39 -Hgb stable, continue to follow    DVT prophylaxis: Lovenox Code Status: Full Family Communication: Wife updated at bedside.  .   Status is: Inpatient  Remains inpatient appropriate because:Inpatient level of care appropriate due to severity of illness   Dispo: The patient is from: Home              Anticipated d/c is to: Home              Anticipated  d/c date is: 3 days              Patient currently is not medically stable to d/c.        I/O last 3 completed shifts: In: 223 [P.O.:120; I.V.:3; IV Piggyback:100] Out: 1550 [Urine:1550] No intake/output data recorded.     Consultants:   None  Procedures: None  Antimicrobials: Cefepime.  Subjective: Shortness of breath improving.  Still has orthopnea. Complains of pain in left lower extremity. He has 800cc of urine in canister   Objective: Vitals:   10/10/19 1436 10/10/19 1500 10/10/19 1530 10/10/19 1649  BP:  (!) 114/57 109/73 119/64  Pulse:  87 97 90  Resp:  (!) 37 (!) 33 (!) 23  Temp:    100.2 F (37.9 C)  TempSrc:    Oral  SpO2:  93% 94% 94%  Weight: (!) 139.3 kg     Height:        Intake/Output Summary (Last 24 hours) at 10/10/2019 1931 Last data filed at 10/10/2019 1854 Gross per 24 hour  Intake 223 ml  Output 850 ml  Net -627 ml   Filed Weights   10/08/19 1652 10/10/19 1436  Weight: 136.1 kg (!) 139.3 kg    Examination:  General exam: Alert, awake, oriented x 3 Respiratory system: Clear to auscultation. Respiratory effort normal. Cardiovascular system:RRR. No murmurs, rubs, gallops. Gastrointestinal system: Abdomen is nondistended, soft and nontender. No organomegaly or masses felt. Normal bowel sounds heard. Central nervous system: Alert and oriented. No focal neurological deficits. Extremities: 2+ edema bilaterally with weeping lesions Skin: ulcers on LE bilaterally Psychiatry: Judgement and insight appear normal. Mood & affect appropriate.          Data Reviewed: I have personally reviewed following labs and imaging studies  CBC: Recent Labs  Lab 10/04/19 1149 10/08/19 1706 10/09/19 0801 10/10/19 0701  WBC 5.1 9.0 9.8 9.9  NEUTROABS  --   --  7.8* 7.5  HGB 9.3* 9.3* 8.7* 9.7*  HCT 30.2* 29.3* 27.2* 29.0*  MCV 93 95.8 95.1 92.9  PLT 224 235 216 366   Basic Metabolic Panel: Recent Labs  Lab 10/04/19 1149 10/08/19 1706 10/09/19 0801 10/10/19 0701  NA 140 138 137 137  K 3.7 3.5 3.6 3.7  CL 101 100 99 98  CO2 25 26 25 28   GLUCOSE 147* 145* 150* 145*  BUN 11 14 15 18   CREATININE 1.30* 1.38* 1.41* 1.53*  CALCIUM 8.9 8.4* 8.5* 8.5*  MG  --   --   --  1.7   GFR: Estimated Creatinine Clearance: 61.5 mL/min (A) (by C-G formula based on SCr of 1.53 mg/dL (H)). Liver  Function Tests: Recent Labs  Lab 10/04/19 1149 10/08/19 1706  AST 34 51*  ALT 15 19  ALKPHOS 164* 121  BILITOT 1.0 2.0*  PROT 6.6 7.3  ALBUMIN 2.8* 2.7*   No results for input(s): LIPASE, AMYLASE in the last 168 hours. No results for input(s): AMMONIA in the last 168 hours. Coagulation Profile: Recent Labs  Lab 10/04/19 1156  INR 5.1*   Cardiac Enzymes: No results for input(s): CKTOTAL, CKMB, CKMBINDEX, TROPONINI in the last 168 hours. BNP (last 3 results) No results for input(s): PROBNP in the last 8760 hours. HbA1C: Recent Labs    10/09/19 0801  HGBA1C 5.7*   CBG: Recent Labs  Lab 10/09/19 1133 10/09/19 1729 10/10/19 0733 10/10/19 1142 10/10/19 1725  GLUCAP 139* 141* 125* 139* 155*   Lipid Profile: No results for input(s): CHOL,  HDL, LDLCALC, TRIG, CHOLHDL, LDLDIRECT in the last 72 hours. Thyroid Function Tests: No results for input(s): TSH, T4TOTAL, FREET4, T3FREE, THYROIDAB in the last 72 hours. Anemia Panel: Recent Labs    10/09/19 0601 10/09/19 1404  VITAMINB12  --  466  TIBC 274  --   IRON 39*  --    Sepsis Labs: No results for input(s): PROCALCITON, LATICACIDVEN in the last 168 hours.  Recent Results (from the past 240 hour(s))  SARS Coronavirus 2 by RT PCR (hospital order, performed in [facility] hospital lab) Nasopharyngeal Nasopharyngeal Swab     Status: None   Collection Time: 10/09/19 12:02 AM   Specimen: Nasopharyngeal Swab  Result Value Ref Range Status   SARS Coronavirus 2 NEGATIVE NEGATIVE Final    Comment: (NOTE) SARS-CoV-2 target nucleic acids are NOT DETECTED.  The SARS-CoV-2 RNA is generally detectable in upper and lower respiratory specimens during the acute phase of infection. The lowest concentration of SARS-CoV-2 viral copies this assay can detect is 250 copies / mL. A negative result does not preclude SARS-CoV-2 infection and should not be used as the sole basis for treatment or other patient management decisions.  A  negative result may occur with improper specimen collection / handling, submission of specimen other than nasopharyngeal swab, presence of viral mutation(s) within the areas targeted by this assay, and inadequate number of viral copies (<250 copies / mL). A negative result must be combined with clinical observations, patient history, and epidemiological information.  Fact Sheet for Patients:   StrictlyIdeas.no  Fact Sheet for Healthcare Providers: BankingDealers.co.za  This test is not yet approved or  cleared by the [place] FDA and has been authorized for detection and/or diagnosis of SARS-CoV-2 by FDA under an Emergency Use Authorization (EUA).  This EUA will remain in effect (meaning this test can be used) for the duration of the COVID-19 declaration under Section 564(b)(1) of the Act, 21 U.S.C. section 360bbb-3(b)(1), unless the authorization is terminated or revoked sooner.  Performed at [facility], [address], [place], [place] [address]          Radiology Studies: DG Tibia/Fibula Left  Result Date: 10/08/2019 CLINICAL DATA:  Open wound to posterior left lower leg. EXAM: LEFT TIBIA AND FIBULA - 2 VIEW COMPARISON:  None. FINDINGS: There is no evidence of fracture or other focal bone lesions. Mild diffuse soft tissue swelling is seen throughout the left calf with moderate severity soft tissue swelling also noted along the anterior aspect of the left tibia. No soft tissue air is seen. IMPRESSION: 1. Soft tissue swelling which may be secondary to diffuse cellulitis, without evidence of an acute osseous abnormality. Electronically Signed   By: Virgina Norfolk M.D.   On: 10/08/2019 19:49        Scheduled Meds: . aspirin EC  81 mg Oral QPM  . enoxaparin (LOVENOX) injection  40 mg Subcutaneous Q12H  . insulin aspart  0-15 Units Subcutaneous TID WC  . metoprolol tartrate  100 mg Oral BID  . pravastatin  20  mg Oral q1800  . sodium chloride flush  3 mL Intravenous Q12H   Continuous Infusions: . sodium chloride    . ceFEPime (MAXIPIME) IV 2 g (10/10/19 1906)  . furosemide (LASIX) infusion 8 mg/hr (10/10/19 1320)     LOS: 2 days    Time spent: 35 minutes    Kathie Dike, MD [facility]   To contact the attending provider between 7A-7P or the covering provider during after  hours 7P-7A, please log into the web site [email or web address] and access using universal [place] password for that web site. If you do not have the password, please call the hospital operator.  10/10/2019, 7:31 PM

## 2019-10-10 NOTE — Evaluation (Signed)
Physical Therapy Evaluation Patient Details Name: Christian Berg MRN: [id number] DOB: [birth year] Today's Date: 10/10/2019   History of Present Illness  Pt is a 69 y.o. male presenting to hospital 9/11 with worsening orthopnea, exertional SOB, worsening LE edema, and developing wounds B LE's.  Pt admitted with acute exacerbation of diastolic CHF and leg wounds; also c/o L LE pain.  PMH inlcudes a-fib, CHF, DM, carotid stenosis, htn, pacemaker, PVD, and TIA.  Clinical Impression  Prior to hospital admission, pt was ambulatory (uses cane most of the time but will use RW if doing a lot of walking) and lives with his wife in 1 level home with 3 STE with L railing.  Nurse reporting pt requesting to walk to toilet (for toileting needs) with walker (pt confirmed this upon PT entering pt's room).  Currently pt is min to mod assist with bed mobility, CGA with transfers, and CGA ambulating 15 feet x2 (bed to/from toilet) with RW.  SOB noted with activity but pt's O2 sats 92% or greater on room air.  Pt's HR 100 bpm at rest and increased up to 133 bpm with activity.  Pt would benefit from skilled PT to address noted impairments and functional limitations (see below for any additional details).  Upon hospital discharge, pt would benefit from [place].    Follow Up Recommendations Home health PT;Supervision for mobility/OOB    Equipment Recommendations   (pt has RW at home already)    Recommendations for Other Services       Precautions / Restrictions Precautions Precautions: Fall Restrictions Weight Bearing Restrictions: No      Mobility  Bed Mobility Overal bed mobility: Needs Assistance Bed Mobility: Supine to Sit;Sit to Supine     Supine to sit: Mod assist;HOB elevated (assist for trunk semi-supine to sitting edge of bed) Sit to supine: Min assist;Mod assist (assist for LE's sit to supine)   General bed mobility comments: vc's for technique; increased effort to perform  Transfers Overall  transfer level: Needs assistance Equipment used: Rolling walker (2 wheeled) Transfers: Sit to/from Stand Sit to Stand: Min guard         General transfer comment: x1 transfer standing from bed (use of RW) and x1 transfer standing from toilet (use of grab bar and RW); mild increased effort to stand on own but steady and demonstrating controlled descent sitting onto toilet and bed  Ambulation/Gait Ambulation/Gait assistance: Min guard Gait Distance (Feet):  (15 feet x2 (bed to/from toilet)) Assistive device: Rolling walker (2 wheeled)   Gait velocity: decreased   General Gait Details: mild decreased stance time L LE; steady with RW; partial step through gait pattern  Stairs            Wheelchair Mobility    Modified Rankin (Stroke Patients Only)       Balance Overall balance assessment: Needs assistance Sitting-balance support: No upper extremity supported;Feet supported Sitting balance-Leahy Scale: Normal Sitting balance - Comments: steady sitting reaching outside BOS   Standing balance support: Single extremity supported Standing balance-Leahy Scale: Fair Standing balance comment: steady standing washing hands at sink with single UE support                             Pertinent Vitals/Pain Pain Assessment: Faces Faces Pain Scale: Hurts little more (0/10 at rest beginning/end of session) Pain Location: L LE with ambulation Pain Descriptors / Indicators: Grimacing;Discomfort;Sore;Aching Pain Intervention(s): Limited activity within patient's tolerance;Monitored during session;Repositioned  Home Living Family/patient expects to be discharged to:: Private residence Living Arrangements: Spouse/significant other Available Help at Discharge: Family Type of Home: House Home Access: Stairs to enter Entrance Stairs-Rails: Left Entrance Stairs-Number of Steps: 3 (from garage) Home Layout: One [place]: [place] - 2 wheels;Cane - single  point;Grab bars - tub/shower      Prior Function Level of Independence: Independent with assistive device(s)         Comments: Uses SPC mostly but will use RW if doing a lot of walking.  1 fall about 5 months ago (passed out?).     Hand Dominance        Extremity/Trunk Assessment   Upper Extremity Assessment Upper Extremity Assessment: Generalized weakness    Lower Extremity Assessment Lower Extremity Assessment: Generalized weakness    Cervical / Trunk Assessment Cervical / Trunk Assessment: Other exceptions (forward head/shoulders)  Communication   Communication: No difficulties  Cognition Arousal/Alertness: Awake/alert Behavior During Therapy: WFL for tasks assessed/performed Overall Cognitive Status: Within Functional Limits for tasks assessed                                        General Comments   Nursing cleared pt for participation in physical therapy.  Pt agreeable to PT session.  Extra time required for set-up d/t lines/leads.    Exercises     Assessment/Plan    PT Assessment Patient needs continued PT services  PT Problem List Decreased strength;Decreased activity tolerance;Decreased balance;Decreased mobility;Cardiopulmonary status limiting activity       PT Treatment Interventions DME instruction;Gait training;Stair training;Functional mobility training;Therapeutic activities;Therapeutic exercise;Balance training;Patient/family education    PT Goals (Current goals can be found in the Care Plan section)  Acute Rehab PT Goals Patient Stated Goal: to improve strength and mobility PT Goal Formulation: With patient Time For Goal Achievement: 10/24/19 Potential to Achieve Goals: Good    Frequency Min 2X/week   Barriers to discharge        Co-evaluation               AM-PAC PT "6 Clicks" Mobility  Outcome Measure Help needed turning from your back to your side while in a flat bed without using bedrails?: A Little Help  needed moving from lying on your back to sitting on the side of a flat bed without using bedrails?: A Lot Help needed moving to and from a bed to a chair (including a wheelchair)?: A Little Help needed standing up from a chair using your arms (e.g., wheelchair or bedside chair)?: A Little Help needed to walk in hospital room?: A Little Help needed climbing 3-5 steps with a railing? : A Little 6 Click Score: 17    End of Session Equipment Utilized During Treatment: Gait belt Activity Tolerance: Patient tolerated treatment well Patient left: in bed;with call bell/phone within reach;with bed alarm set Nurse Communication: Mobility status;Precautions (bleeding noted from rectum when wiping (appearing to be from outside skin) and also redness noted above rectal area; elevated HR with activity; nurse came to put on new male external catheter) PT Visit Diagnosis: Other abnormalities of gait and mobility (R26.89);Muscle weakness (generalized) (M62.81);History of falling (Z91.81);Pain Pain - Right/Left: Left Pain - part of body: Leg    Time: 1003-1101 PT Time Calculation (min) (ACUTE ONLY): 58 min   Charges:   PT Evaluation $PT Eval Low Complexity: 1 Low PT Treatments $Therapeutic Activity: 38-52 mins  Leitha Bleak, PT 10/10/19, 11:31 AM

## 2019-10-10 NOTE — ED Notes (Signed)
Pt a difficult IV start and lab called and asked to obtain morning labs

## 2019-10-10 NOTE — TOC Initial Note (Signed)
Transition of Care [facility]) - Initial/Assessment Note    Patient Details  Name: Christian Berg MRN: [id number] Date of Birth: 22-Jul-1950  [facility] CM/SW Contact:    Anselm Pancoast, RN Phone Number: 10/10/2019, 2:26 PM  Clinical Narrative:                 Spoke to patient at bedside and patient was adamant he was leaving AMA. ED RN entered room and advised patient that EDP was coming into talk about patients wishes for discharge. Patient states he lives at home with his wife who completes all his wound care but she could use some assistance with medication mgmt. Patient has walker and cane and has no problems with getting food or transportation to appointments as needed. Patient is agreeable to home health at discharge if needed and does not remember who he has used in the past.   Expected Discharge Plan: [place] Barriers to Discharge: Continued Medical Work up   Patient Goals and CMS Choice Patient states their goals for this hospitalization and ongoing recovery are:: Get back home      Expected Discharge Plan and Services Expected Discharge Plan: [place]       Living arrangements for the past 2 months: Single Family Home                                      Prior Living Arrangements/Services Living arrangements for the past 2 months: Single Family Home Lives with:: Spouse Patient language and need for interpreter reviewed:: Yes Do you feel safe going back to the place where you live?: Yes      Need for Family Participation in Patient Care: Yes (Comment) Care giver support system in place?: Yes (comment) Current home services: Home PT, Home RN Criminal Activity/Legal Involvement Pertinent to Current Situation/Hospitalization: No - Comment as needed  Activities of Daily Living      Permission Sought/Granted                  Emotional Assessment Appearance:: Appears stated  age Attitude/Demeanor/Rapport: Inconsistent Affect (typically observed): Accepting Orientation: : Oriented to Self, Oriented to Place, Oriented to  Time, Oriented to Situation Alcohol / Substance Use: Not Applicable Psych Involvement: No (comment)  Admission diagnosis:  Acute exacerbation of CHF (congestive heart failure) ([place]) [I50.9] Patient Active Problem List   Diagnosis Date Noted  . Acute exacerbation of CHF (congestive heart failure) ([place]) 10/08/2019  . Leg wound, left, initial encounter 10/08/2019  . Acquired thrombophilia ([place])   . Chronic low back pain   . Chronic anticoagulation 08/04/2019  . CKD stage 3 due to type 2 diabetes mellitus ([place]) 08/04/2019  . Intractable diarrhea 08/04/2019  . Iron deficiency anemia 06/02/2019  . Mixed simple and mucopurulent chronic bronchitis ([place]) 05/31/2019  . Moderate tricuspid regurgitation by prior echocardiogram 12/16/2018  . NASH (nonalcoholic steatohepatitis) 10/22/2015  . History of embolic stroke 35/70/1779  . Morbid (severe) obesity due to excess calories ([place]) 11/22/2014  . VFD (visual field defect) 11/22/2014  . History of TIA (transient ischemic attack) 10/31/2014  . Kidney stones 07/24/2014  . Other cirrhosis of liver ([place]) 07/24/2014  . DSAP (disseminated superficial actinic porokeratosis) 07/17/2014  . Asthma 05/31/2014  . (HFpEF) heart failure with preserved ejection fraction ([place]) 05/31/2014  . Diabetes mellitus ([place]) 05/31/2014  . Hypercholesteremia 05/31/2014  . B12  deficiency 05/31/2014  . Carotid artery narrowing 02/15/2014  . Bilateral carotid artery stenosis 02/15/2014  . Chronic atrial fibrillation ([place]) 10/24/2013  . Benign prostatic hyperplasia with urinary obstruction 09/06/2012  . Heart disease 05/29/2009  . Benign essential HTN 09/29/2008  . Narrowing of intervertebral disc space 01/11/2007   PCP:  Birdie Sons, MD Pharmacy:   [facility] [address], [place] - [place] [address] [place] [address] Phone: [phone] Fax: [phone]     Social Determinants of Health (SDOH) Interventions    Readmission Risk Interventions No flowsheet data found.

## 2019-10-10 NOTE — ED Notes (Signed)
Pt cleaned of stool and urine. Male purewick was leaking. Pads changed, brief under pt in case has another stool.

## 2019-10-11 DIAGNOSIS — L565 Disseminated superficial actinic porokeratosis (DSAP): Secondary | ICD-10-CM

## 2019-10-11 DIAGNOSIS — I6523 Occlusion and stenosis of bilateral carotid arteries: Secondary | ICD-10-CM

## 2019-10-11 LAB — PROTIME-INR
INR: 2 — ABNORMAL HIGH (ref 0.8–1.2)
Prothrombin Time: 21.6 seconds — ABNORMAL HIGH (ref 11.4–15.2)

## 2019-10-11 LAB — GLUCOSE, CAPILLARY
Glucose-Capillary: 132 mg/dL — ABNORMAL HIGH (ref 70–99)
Glucose-Capillary: 141 mg/dL — ABNORMAL HIGH (ref 70–99)
Glucose-Capillary: 136 mg/dL — ABNORMAL HIGH (ref 70–99)
Glucose-Capillary: 145 mg/dL — ABNORMAL HIGH (ref 70–99)

## 2019-10-11 LAB — GASTROINTESTINAL PANEL BY PCR, STOOL (REPLACES STOOL CULTURE)

## 2019-10-11 LAB — BASIC METABOLIC PANEL
Anion gap: 11 (ref 5–15)
BUN: 19 mg/dL (ref 8–23)
CO2: 28 mmol/L (ref 22–32)
Calcium: 8.1 mg/dL — ABNORMAL LOW (ref 8.9–10.3)
Chloride: 97 mmol/L — ABNORMAL LOW (ref 98–111)
Creatinine, Ser: 1.44 mg/dL — ABNORMAL HIGH (ref 0.61–1.24)
GFR calc Af Amer: 57 mL/min — ABNORMAL LOW (ref >60)
GFR calc non Af Amer: 49 mL/min — ABNORMAL LOW (ref >60)
Glucose, Bld: 142 mg/dL — ABNORMAL HIGH (ref 70–99)
Potassium: 3.2 mmol/L — ABNORMAL LOW (ref 3.5–5.1)
Sodium: 136 mmol/L (ref 135–145)

## 2019-10-11 LAB — CBC
HCT: 26.3 % — ABNORMAL LOW (ref 39.0–52.0)
Hemoglobin: 8.6 g/dL — ABNORMAL LOW (ref 13.0–17.0)
MCH: 30.8 pg (ref 26.0–34.0)
MCHC: 32.7 g/dL (ref 30.0–36.0)
MCV: 94.3 fL (ref 80.0–100.0)
Platelets: 206 10*3/uL (ref 150–400)
RBC: 2.79 MIL/uL — ABNORMAL LOW (ref 4.22–5.81)
RDW: 22.3 % — ABNORMAL HIGH (ref 11.5–15.5)
WBC: 8.8 10*3/uL (ref 4.0–10.5)
nRBC: 0 % (ref 0.0–0.2)

## 2019-10-11 MED ORDER — HYDROCERIN EX CREA
TOPICAL_CREAM | Freq: Every day | CUTANEOUS | Status: DC
  Administered 2019-10-13 – 2019-10-20 (×2): 1 via TOPICAL
  Filled 2019-10-11 (×2): qty 113

## 2019-10-11 NOTE — Progress Notes (Signed)
PROGRESS NOTE    Christian Berg  HBZ:[id number] DOB: 07-10-1950 DOA: 10/08/2019 PCP: Birdie Sons, MD   Brief Narrative: HPI per Dr. Gala Romney on 10/09/19 Christian Berg is a 69 y.o. male with medical history significant of diabetes, hypertension, diastolic dysfunction CHF, disseminated superficial actinic porokeratosis, degenerative disc disease, recent bleeding episodes, atrial fibrillation and peripheral vascular disease who presented to the ER with left lower extremity ulcer.  He already has a right lower extremity ulcer.  He came in for wound check.  Patient has noted increasing weight about 25 pounds more in the last few weeks.  He is taking diuretics at home.  He also has noted increasing shortness of breath.  He has bruising easily and was told to stop his Coumadin about a week ago.  Patient was seen in the ER in addition to his leg wounds was found to be having episode of anasarca.  Appears he has acute exacerbation of his diastolic CHF so he is being admitted to the hospital for further evaluation and treatment..  ED Course: Temperature is 99.1 blood pressure 130/47 pulse 140 respirate 26 oxygen sats 94% on room air.  White count 9.0 hemoglobin 9.3 and platelet 235.  Chemistry appears to be within normal except for creatinine 1.38 and calcium 8.4.  Glucose 145.  COVID-19 screen is negative.  Chest x-ray showed evidence of mild fluid overload.  X-ray of the tibia-fibula shows no evidence of osseous abnormality.  Patient being admitted with acute exacerbation of CHF and also possible infected wound.  **Interim History  Is started on IV Lasix and this was changed to Lasix drip.  Wound care was consulted for the left lower extremity and cefepime was started for infection.  He started having significant amount of diarrhea but clinically do not suspect C. difficile unlikely this is antibiotic mediated diarrhea.  He is currently afebrile and has no leukocytosis.  Still remains very  swollen so cardiology was consulted for further evaluation as he remains on Lasix drip.  Assessment & Plan:   Principal Problem:   (HFpEF) heart failure with preserved ejection fraction (HCC) Active Problems:   Diabetes mellitus ([place])   Benign essential HTN   Benign prostatic hyperplasia with urinary obstruction   Chronic atrial fibrillation (HCC)   Morbid (severe) obesity due to excess calories (HCC)   NASH (nonalcoholic steatohepatitis)   Bilateral carotid artery stenosis   CKD stage 3 due to type 2 diabetes mellitus (HCC)   Acute exacerbation of CHF (congestive heart failure) (HCC)   Leg wound, left, initial encounter  Acute on Chronic Diastolic Congestive Heart Failure. -Patient presented with evidence of bichamber congestive heart failure.  Severe volume overload with anasarca. -BNP on admission was 399.1 -Reviewed patient most recent echocardiogram performed in 08/05/2019, ejection fraction was normal. -No significant pulmonary hypertension. -Currently on lasix infusion with fair urine output -Continues to have evidence of volume overload. -Monitor electrolytes and renal function. -I's and O's and daily weights; patient is -3.827 L since admission and has gone from 300 pounds to 312 pounds request that this is accurate -Continue to monitor for signs and symptoms of volume overload -Continue with metoprolol tartrate 100 g p.o. twice daily -Cardiology was consulted for further evaluation  Left leg wound with infection in the setting of DSAP -Patient has a bilateral leg edema appears to be secondary to venous stasis and congestive heart failure.   -Posterior left leg also has ulcer, appears to be infected.   -Wound care has  been consulted.  Continue on Cefepime.   -We will monitor this closely, will change antibiotics if condition does not improve quickly.  Diarrhea Plan likely antibiotic mediated next-hold all laxatives -Check a GI pathogen panel Insert Flexi-Seal given his  amount of diarrhea -Unlikely C. difficile as afebrile and has no leukocytosis And if continues to persist or worsens and need to rule out C. difficile will need permission from the hospital CMO infectious disease to order -Check FOBT as diarrhea is dark and he has been on anticoagulation with the INR greater than 5 prior to admission  Chronic Atrial fibrillation with rapid ventricular response. -Patient was taken off of warfarin recently due to concern of skin bleeding.  Patient has some tachycardia, continue beta-blocker. -INR prior to admission was >5 -recheck INR and if less than 2 will restart coumadin; INR was 2.0 today and PT was 21.6  Type 2 Diabetes Mellitus -Continue to Hold Metformin -Continue sliding scale insulin.  -CBG's ranging from 132-155   NASH liver cirrhosis. -Continue to monitor. -Check LFTs in the morning  Hypokalemia -In the setting of the Lasix drip and diarrhea -The Patient's potassium this morning was 3.2 -Replete with p.o. potassium chloride 40 mg twice daily x2 doses -Continue to monitor and treat as necessary  Chronic kidney disease stage IIIa. -Continue to follow renal function in the setting of diuresis. -BUN/creatinine went from 18/12.53 and is now 19/1.44 -Avoid further nephrotoxic medications, contrast dyes, hypotension and renally dose medications  Essential Hypertension. -Continue on Metoprolol Tartrate 100 mg po BID -Last blood pressure is 120/71 -Continue to monitor blood pressures per protocol  Normocytic Anemia/Anemia of Chronic Kidney Disease -Patient's B12 was 466 and iron panel showed an iron level of 39, U IBC 235, TIBC of 274, saturation ratios of 14% -Continue to monitor for signs and symptoms bleeding; currently no overt bleeding noted -Repeat CBC in a.m.  Morbid Obesity -Estimated body mass index is 47.53 kg/m as calculated from the following:   Height as of this encounter: 5' 8"  (1.727 m).   Weight as of this  encounter: 141.8 kg. -Weight Loss and Dietary Counseling given   DVT prophylaxis: Enoxaparin 40 mg sq q24h for now Code Status: FULL CODE  Family Communication: No family present at bedside  Disposition Plan: Pending further improvement and work-up and clearance by cardiology Legrand Como will need to evaluate his diarrhea as well  Status is: Inpatient  Remains inpatient appropriate because:Unsafe d/c plan, IV treatments appropriate due to intensity of illness or inability to take PO and Inpatient level of care appropriate due to severity of illness   Dispo: The patient is from: Home              Anticipated d/c is to: TBD              Anticipated d/c date is: 2 days              Patient currently is not medically stable to d/c.  Consultants:   Cardiology   Procedures: None  Antimicrobials:  Anti-infectives (From admission, onward)   Start     Dose/Rate Route Frequency Ordered Stop   10/11/19 2100  ceFEPIme (MAXIPIME) 2 g in sodium chloride 0.9 % 100 mL IVPB  Status:  Discontinued        2 g 200 mL/hr over 30 Minutes Intravenous Every 8 hours 10/10/19 1853 10/10/19 1859   10/10/19 1900  ceFEPIme (MAXIPIME) 2 g in sodium chloride 0.9 % 100 mL IVPB  2 g 200 mL/hr over 30 Minutes Intravenous Every 8 hours 10/10/19 1859     10/09/19 0000  ceFEPIme (MAXIPIME) 2 g in sodium chloride 0.9 % 100 mL IVPB  Status:  Discontinued        2 g 200 mL/hr over 30 Minutes Intravenous Every 8 hours 10/08/19 2346 10/10/19 1853        Subjective: Seen and examined at bedside and he was not feeling well and felt no real improvement.  Continues to complain of significant amount of diarrhea and states that is constant.  States his legs are still swollen and that his leg hurts.  No chest pain, lightheadedness or dizziness.  Objective: Vitals:   10/11/19 0438 10/11/19 0500 10/11/19 0907 10/11/19 1307  BP: 137/74  129/84 120/71  Pulse: (!) 108  (!) 106 (!) 102  Resp: 18     Temp: 98.5 F (36.9  C)   97.9 F (36.6 C)  TempSrc: Oral     SpO2: 95%   95%  Weight:  (!) 141.8 kg    Height:        Intake/Output Summary (Last 24 hours) at 10/11/2019 1650 Last data filed at 10/11/2019 0800 Gross per 24 hour  Intake 120 ml  Output 2500 ml  Net -2380 ml   Filed Weights   10/08/19 1652 10/10/19 1436 10/11/19 0500  Weight: 136.1 kg (!) 139.3 kg (!) 141.8 kg   Examination: Physical Exam:  Constitutional: WN/WD morbidly obese Caucasian male currently in no acute distress but appears slightly anxious and uncomfortable Eyes: Lids and conjunctivae normal, sclerae anicteric  ENMT: External Ears, Nose appear normal. Grossly normal hearing. Neck: Appears normal, supple, no cervical masses, normal ROM, no appreciable thyromegaly; some JVD Respiratory: Diminished to auscultation bilaterally with coarse breath sounds, no wheezing, rales, rhonchi or crackles. Normal respiratory effort and patient is not tachypenic. No accessory muscle use and has unlabored breathing.   Cardiovascular: Irregularly irregular and slightly tachycardic, no murmurs / rubs / gallops.  1-2+ lower extremity edema Abdomen: Soft, non-tender, distended secondary body habitus.  Bowel sounds positive.  GU: Deferred. Musculoskeletal: No clubbing / cyanosis of digits/nails. No joint deformity upper and lower extremities.  Skin: Has multiple skin lesions in the lower extremities and he states that this is disseminated superficial actinic porokeratosis. No induration; Warm and dry.  Neurologic: CN 2-12 grossly intact with no focal deficits. Romberg sign and cerebellar reflexes not assessed.  Psychiatric: Normal judgment and insight. Alert and oriented x 3.  Anxious mood and appropriate affect.   Data Reviewed: I have personally reviewed following labs and imaging studies  CBC: Recent Labs  Lab 10/08/19 1706 10/09/19 0801 10/10/19 0701 10/11/19 0522  WBC 9.0 9.8 9.9 8.8  NEUTROABS  --  7.8* 7.5  --   HGB 9.3* 8.7* 9.7*  8.6*  HCT 29.3* 27.2* 29.0* 26.3*  MCV 95.8 95.1 92.9 94.3  PLT 235 216 205 132   Basic Metabolic Panel: Recent Labs  Lab 10/08/19 1706 10/09/19 0801 10/10/19 0701 10/11/19 0522  NA 138 137 137 136  K 3.5 3.6 3.7 3.2*  CL 100 99 98 97*  CO2 26 25 28 28   GLUCOSE 145* 150* 145* 142*  BUN 14 15 18 19   CREATININE 1.38* 1.41* 1.53* 1.44*  CALCIUM 8.4* 8.5* 8.5* 8.1*  MG  --   --  1.7  --    GFR: Estimated Creatinine Clearance: 66 mL/min (A) (by C-G formula based on SCr of 1.44 mg/dL (H)). Liver Function  Tests: Recent Labs  Lab 10/08/19 1706  AST 51*  ALT 19  ALKPHOS 121  BILITOT 2.0*  PROT 7.3  ALBUMIN 2.7*   No results for input(s): LIPASE, AMYLASE in the last 168 hours. No results for input(s): AMMONIA in the last 168 hours. Coagulation Profile: Recent Labs  Lab 10/11/19 0522  INR 2.0*   Cardiac Enzymes: No results for input(s): CKTOTAL, CKMB, CKMBINDEX, TROPONINI in the last 168 hours. BNP (last 3 results) No results for input(s): PROBNP in the last 8760 hours. HbA1C: Recent Labs    10/09/19 0801  HGBA1C 5.7*   CBG: Recent Labs  Lab 10/10/19 1142 10/10/19 1725 10/10/19 2055 10/11/19 0803 10/11/19 1304  GLUCAP 139* 155* 154* 132* 141*   Lipid Profile: No results for input(s): CHOL, HDL, LDLCALC, TRIG, CHOLHDL, LDLDIRECT in the last 72 hours. Thyroid Function Tests: No results for input(s): TSH, T4TOTAL, FREET4, T3FREE, THYROIDAB in the last 72 hours. Anemia Panel: Recent Labs    10/09/19 0601 10/09/19 1404  VITAMINB12  --  466  TIBC 274  --   IRON 39*  --    Sepsis Labs: No results for input(s): PROCALCITON, LATICACIDVEN in the last 168 hours.  Recent Results (from the past 240 hour(s))  SARS Coronavirus 2 by RT PCR (hospital order, performed in [facility] hospital lab) Nasopharyngeal Nasopharyngeal Swab     Status: None   Collection Time: 10/09/19 12:02 AM   Specimen: Nasopharyngeal Swab  Result Value Ref Range Status   SARS  Coronavirus 2 NEGATIVE NEGATIVE Final    Comment: (NOTE) SARS-CoV-2 target nucleic acids are NOT DETECTED.  The SARS-CoV-2 RNA is generally detectable in upper and lower respiratory specimens during the acute phase of infection. The lowest concentration of SARS-CoV-2 viral copies this assay can detect is 250 copies / mL. A negative result does not preclude SARS-CoV-2 infection and should not be used as the sole basis for treatment or other patient management decisions.  A negative result may occur with improper specimen collection / handling, submission of specimen other than nasopharyngeal swab, presence of viral mutation(s) within the areas targeted by this assay, and inadequate number of viral copies (<250 copies / mL). A negative result must be combined with clinical observations, patient history, and epidemiological information.  Fact Sheet for Patients:   StrictlyIdeas.no  Fact Sheet for Healthcare Providers: BankingDealers.co.za  This test is not yet approved or  cleared by the [place] FDA and has been authorized for detection and/or diagnosis of SARS-CoV-2 by FDA under an Emergency Use Authorization (EUA).  This EUA will remain in effect (meaning this test can be used) for the duration of the COVID-19 declaration under Section 564(b)(1) of the Act, 21 U.S.C. section 360bbb-3(b)(1), unless the authorization is terminated or revoked sooner.  Performed at [facility], [address] [place], [place] [address]     RN Pressure Injury Documentation:     Estimated body mass index is 47.53 kg/m as calculated from the following:   Height as of this encounter: 5' 8"  (1.727 m).   Weight as of this encounter: 141.8 kg.  Malnutrition Type:      Malnutrition Characteristics:      Nutrition Interventions:    Radiology Studies: No results found.   Scheduled Meds: . aspirin EC  81 mg Oral QPM  .  enoxaparin (LOVENOX) injection  40 mg Subcutaneous Q12H  . hydrocerin   Topical Daily  . insulin aspart  0-15 Units Subcutaneous TID WC  . metoprolol  tartrate  100 mg Oral BID  . pravastatin  20 mg Oral q1800  . sodium chloride flush  3 mL Intravenous Q12H   Continuous Infusions: . sodium chloride    . ceFEPime (MAXIPIME) IV 2 g (10/11/19 1033)  . furosemide (LASIX) infusion 8 mg/hr (10/11/19 0721)    LOS: 3 days   Kerney Elbe, DO [facility] PAGER is on AMION  If 7PM-7AM, please contact night-coverage [email or web address]

## 2019-10-11 NOTE — Care Management Important Message (Signed)
Important Message  Patient Details  Name: Christian Berg MRN: [id number] Date of Birth: 03-08-1950   Medicare Important Message Given:  Yes     [name] 10/11/2019, 11:20 AM

## 2019-10-11 NOTE — Consult Note (Addendum)
[facility] Nurse Consult Note: Reason for Consult: Consult requested for left leg.  Pt has dry scaly skin and multiple areas of dried scabs to extremities.  He states he has been diagnosed with "DSAP: Disseminated superficial actinic porokeratosis" a rare complex dermatological condition and there is no cure. It sometimes leads to partial thickness wounds.  Wound type: Left posterior calf with partial thickness wound; 4X4X.1cm, yellow moist wound bed, small amt yellow drainage.  Dressing procedure/placement/frequency: Topical treatment orders provided for the bedside nurses to perform as follows to protect from further injury: Foam dressing to left posterior leg, change Q 3 days or PRN soiling. Apply Eucerin cream to bilat legs and arms Q day to reduce dryness and itching. Please re-consult if further assistance is needed.  Thank-you,  Julien Girt MSN, [place], [place], [place], [place]

## 2019-10-11 NOTE — Consult Note (Signed)
CARDIOLOGY CONSULT NOTE               Patient ID: Christian Berg MRN: [id number] DOB/AGE: 04/24/1950 69 y.o.  Admit date: 10/08/2019 Referring Physician Alfredia Ferguson, MD Primary Physician Lelon Huh, MD Primary Cardiologist Nehemiah Massed, MD Reason for Consultation acute on chronic diastolic CHF  HPI: 69 year old gentleman referred for evaluation of acute on chronic diastolic CHF.  The patient has a history of chronic atrial fibrillation on warfarin, pulmonary hypertension, type 2 diabetes, peripheral vascular disease, NASH, CKD stage III, and morbid obesity.  The patient presented to [facility] ER on 10/08/2019 for worsening orthopnea, progressive shortness of breath, about 25 pound weight gain in the last few weeks, and peripheral edema with a developing wounds to both legs. The patient typically resides in his lift chair at home. ECG revealed atrial fibrillation at a rate of 109 bpm with ectopy without evidence of ischemia.  Due to anasarca, the patient was started on Lasix drip. Chest x-ray reveals volume overload with pulmonary edema. Labs notable for potassium 3.2, creatinine 1.44, GFR 49, hemoglobin 8.6, hematocrit 26.3, albumin 2.7.  2D echocardiogram 08/05/2019 reveals normal left ventricular function with LVEF 60 to 65% with no wall motion abnormalities. The patient denies any significant chest pain. He reports that his breathing is not improved. The patient main complaint at this time is diarrhea since starting the antibiotic for leg wound infection, and inability to sleep in the hospital.  Review of systems complete and found to be negative unless listed above     Past Medical History:  Diagnosis Date  . A-fib ([place])   . Carotid stenosis   . CHF (congestive heart failure) ([place])   . Degenerative disc disease, lumbar    s/p injury  . Diabetes mellitus without complication ([place])   . Disseminated superficial actinic porokeratosis   . Dysrhythmia    A-FIB, palpatations  . Epiglottitis  03/30/2017  . Fatty liver   . History of degenerative disc disease   . History of kidney stones   . Hypercholesteremia   . Hypertension   . Kidney stones   . Obesity   . Pacemaker    inactive  . Peripheral vascular disease ([place])    Carotid stenosis  . Presence of permanent cardiac pacemaker    Inactive  . TIA (transient ischemic attack) 2011   No deficits  . TIA (transient ischemic attack)   . Vitamin B12 deficiency     Past Surgical History:  Procedure Laterality Date  . APPENDECTOMY  2004  . CARDIAC CATHETERIZATION    . CARDIAC PACEMAKER PLACEMENT  2005  . CATARACT EXTRACTION W/PHACO Right 06/14/2014   Procedure: CATARACT EXTRACTION PHACO AND INTRAOCULAR LENS PLACEMENT (IOC);  Surgeon: Leandrew Koyanagi, MD;  Location: [place];  Service: Ophthalmology;  Laterality: Right;  . CATARACT EXTRACTION W/PHACO Left 10/27/2018   Procedure: CATARACT EXTRACTION PHACO AND INTRAOCULAR LENS PLACEMENT (IOC) LEFT DIABETIC  00:46.4  18.6%  8.63;  Surgeon: Leandrew Koyanagi, MD;  Location: [place];  Service: Ophthalmology;  Laterality: Left;  Diabetic - oral meds  . CYSTOSCOPY W/ RETROGRADES Bilateral 08/09/2014   Procedure: CYSTOSCOPY WITH RETROGRADE PYELOGRAM;  Surgeon: Hollice Espy, MD;  Location: [facility];  Service: Urology;  Laterality: Bilateral;  . INTUBATION-ENDOTRACHEAL WITH TRACHEOSTOMY STANDBY  03/30/2017   Procedure: INTUBATION-ENDOTRACHEAL WITH TRACHEOSTOMY STANDBY;  Surgeon: Carloyn Manner, MD;  Location: [facility];  Service: ENT;;    Medications Prior to Admission  Medication Sig Dispense Refill Last Dose  . albuterol (VENTOLIN  HFA) 108 (90 Base) MCG/ACT inhaler Inhale 2 puffs into the lungs every 6 (six) hours as needed for wheezing or shortness of breath. 18 g 3 prn at prn  . aspirin EC 81 MG tablet Take 81 mg by mouth every evening.    Past Week at Unknown time  . cephALEXin (KEFLEX) 500 MG capsule Take 1 capsule (500 mg total) by mouth 4 (four) times  daily for 10 days. 40 capsule 0 Past Week at Unknown time  . ferrous sulfate 325 (65 FE) MG tablet Take 325 mg by mouth daily with breakfast.   Past Week at Unknown time  . furosemide (LASIX) 20 MG tablet Take 1 tablet by mouth once daily (Patient taking differently: Take 60 mg by mouth daily. ) 90 tablet 0 Past Week at Unknown time  . lisinopril (ZESTRIL) 20 MG tablet Take 1 tablet (20 mg total) by mouth daily. 90 tablet 3 Past Week at Unknown time  . lovastatin (MEVACOR) 20 MG tablet Take 1 tablet (20 mg total) by mouth every evening. 90 tablet 3 Past Week at Unknown time  . metFORMIN (GLUCOPHAGE) 1000 MG tablet Take 1 tablet by mouth twice daily (Patient taking differently: Take 500 mg by mouth 2 (two) times daily with a meal. ) 180 tablet 0 Past Week at Unknown time  . metoprolol tartrate (LOPRESSOR) 100 MG tablet Take 1 tablet (100 mg total) by mouth at bedtime. (Patient taking differently: Take 100 mg by mouth 2 (two) times daily. )   Past Week at Unknown time  . traMADol-acetaminophen (ULTRACET) 37.5-325 MG tablet Take 1 tablet by mouth every 6 (six) hours as needed for severe pain. 10 tablet 0 prn at prn  . warfarin (COUMADIN) 5 MG tablet Take 1/2 tablet daily or as directed by physician   Past Week at Unknown time  . ketoconazole (NIZORAL) 2 % cream Apply 1 application topically daily as needed for irritation. (Patient not taking: Reported on 10/09/2019)   Not Taking at Unknown time  . nystatin cream (MYCOSTATIN) Apply 1 application topically 2 (two) times daily. (Patient not taking: Reported on 10/09/2019) 30 g 0 Not Taking at Unknown time  . spironolactone (ALDACTONE) 25 MG tablet Take 25 mg by mouth daily. (Patient not taking: Reported on 10/09/2019)   Not Taking at Unknown time   Social History   Socioeconomic History  . Marital status: Married    Spouse name: Not on file  . Number of children: 2  . Years of education: Not on file  . Highest education level: High school graduate    Occupational History  . Occupation: retired  . Occupation: disable  Tobacco Use  . Smoking status: Never Smoker  . Smokeless tobacco: Never Used  Vaping Use  . Vaping Use: Never used  Substance and Sexual Activity  . Alcohol use: No    Alcohol/week: 0.0 standard drinks  . Drug use: No  . Sexual activity: Not on file  Other Topics Concern  . Not on file  Social History Narrative   Lives at home with wife. Independent at baseline   Social Determinants of Health   Financial Resource Strain: Low Risk   . Difficulty of Paying Living Expenses: Not hard at all  Food Insecurity: No Food Insecurity  . Worried About Charity fundraiser in the Last Year: Never true  . Ran Out of Food in the Last Year: Never true  Transportation Needs: No Transportation Needs  . Lack of Transportation (Medical): No  .  Lack of Transportation (Non-Medical): No  Physical Activity: Inactive  . Days of Exercise per Week: 0 days  . Minutes of Exercise per Session: 0 min  Stress: No Stress Concern Present  . Feeling of Stress : Not at all  Social Connections: Moderately Isolated  . Frequency of Communication with Friends and Family: Twice a week  . Frequency of Social Gatherings with Friends and Family: Once a week  . Attends Religious Services: Never  . Active Member of Clubs or Organizations: No  . Attends Archivist Meetings: Never  . Marital Status: Married  Human resources officer Violence: Not At Risk  . Fear of Current or Ex-Partner: No  . Emotionally Abused: No  . Physically Abused: No  . Sexually Abused: No    Family History  Adopted: Yes  Problem Relation Age of Onset  . Heart disease Mother   . Fibromyalgia Sister   . Hypertension Daughter   . Migraines Daughter   . Kidney disease Neg Hx   . Prostate cancer Neg Hx   . Bladder Cancer Neg Hx       Review of systems complete and found to be negative unless listed above      PHYSICAL EXAM  General: morbidly obese gentleman  lying in bed at a 30 degree angle in no acute distress. Anasarca. HEENT:  Normocephalic and atramatic Neck:  No JVD.  Lungs: Clear bilaterally to auscultation. Slight increased effort of breathing on room air, speaking in broken sentences Heart: HRRR . Normal S1 and S2 without gallops or murmurs.  Abdomen: Bowel sounds are positive, abdomen soft and non-tender  Msk:  Back normal, gait not assessed.  Extremities: lower extremity edema   Neuro: Alert and oriented X 3. Psych:  Good affect, responds appropriately  Labs:   Lab Results  Component Value Date   WBC 8.8 10/11/2019   HGB 8.6 (L) 10/11/2019   HCT 26.3 (L) 10/11/2019   MCV 94.3 10/11/2019   PLT 206 10/11/2019    Recent Labs  Lab 10/08/19 1706 10/09/19 0801 10/11/19 0522  NA 138   < > 136  K 3.5   < > 3.2*  CL 100   < > 97*  CO2 26   < > 28  BUN 14   < > 19  CREATININE 1.38*   < > 1.44*  CALCIUM 8.4*   < > 8.1*  PROT 7.3  --   --   BILITOT 2.0*  --   --   ALKPHOS 121  --   --   ALT 19  --   --   AST 51*  --   --   GLUCOSE 145*   < > 142*   < > = values in this interval not displayed.   Lab Results  Component Value Date   TROPONINI <0.03 04/03/2017    Lab Results  Component Value Date   CHOL 117 03/02/2019   CHOL 133 10/23/2017   CHOL 105 05/24/2015   Lab Results  Component Value Date   HDL 37 (L) 03/02/2019   HDL 41 10/23/2017   HDL 36 (L) 05/24/2015   Lab Results  Component Value Date   LDLCALC 48 03/02/2019   LDLCALC 48 10/23/2017   LDLCALC 42 05/24/2015   Lab Results  Component Value Date   TRIG 197 (H) 03/02/2019   TRIG 218 (H) 10/23/2017   TRIG 136 05/24/2015   Lab Results  Component Value Date   CHOLHDL 3.2 03/02/2019   CHOLHDL  3.2 10/23/2017   CHOLHDL 2.9 05/24/2015   No results found for: LDLDIRECT    Radiology: DG Chest 2 View  Result Date: 10/08/2019 CLINICAL DATA:  Shortness of breath, open wounds to the posterior left lower leg and anterior right lower leg, history of  asthma EXAM: CHEST - 2 VIEW COMPARISON:  Radiograph 08/08/2019, CT 09/15/2015 FINDINGS: Diffuse hazy basilar predominant interstitial opacities with some fissural thickening as well. More patchy retrocardiac infrahilar opacities in the lung bases. No pneumothorax or visible effusion. Prominent cardiac silhouette compatible with cardiomegaly seen on comparison studies. Pacer pack overlies the left chest wall with leads at the cardiac apex and right atrium in similar positioning to prior. Remaining cardiomediastinal contours are unremarkable. No acute osseous or soft tissue abnormality. Degenerative changes are present in the imaged spine and shoulders. Prior left humeral ORIF is noted. IMPRESSION: 1. Features of CHF/volume overload with cardiomegaly and interstitial opacities suggesting edema. 2. More patchy retrocardiac and infrahilar opacities in the lung bases may reflect developing alveolar edema, atelectasis or airspace disease. Electronically Signed   By: Lovena Le M.D.   On: 10/08/2019 17:36   DG Tibia/Fibula Left  Result Date: 10/08/2019 CLINICAL DATA:  Open wound to posterior left lower leg. EXAM: LEFT TIBIA AND FIBULA - 2 VIEW COMPARISON:  None. FINDINGS: There is no evidence of fracture or other focal bone lesions. Mild diffuse soft tissue swelling is seen throughout the left calf with moderate severity soft tissue swelling also noted along the anterior aspect of the left tibia. No soft tissue air is seen. IMPRESSION: 1. Soft tissue swelling which may be secondary to diffuse cellulitis, without evidence of an acute osseous abnormality. Electronically Signed   By: Virgina Norfolk M.D.   On: 10/08/2019 19:49    EKG: atrial fibrillation  ASSESSMENT AND PLAN:  1. Acute on chronic diastolic CHF, presenting with weight gain, progressive exertional dyspnea, peripheral edema, and orthopnea, with anasarca, currently on Lasix drip. 2. Chronic atrial fibrillation, on metoprolol tartrate 100 mg twice  daily for rate control, warfarin on hold due to concern of bleeding.  INR currently 2.0 3. Anemia, hemoglobin 8.6, hematocrit 26.3 4. Diarrhea, possibly secondary to antibiotic, GI panel pending 5. NASH  Recommendations: 1.  Continue Lasix drip with careful monitoring of renal status, I&O's, and daily weight.  2.  Management of anemia per hospitalist 3.  Continue metoprolol titrate for rate control 4.  Consider resuming warfarin if anemia stable and no sign of active GI bleeding with goal INR 2-3   Signed: Clabe Seal PA-C 10/11/2019, 1:58 PM

## 2019-10-12 ENCOUNTER — Inpatient Hospital Stay: Payer: Medicare HMO

## 2019-10-12 ENCOUNTER — Ambulatory Visit: Payer: Self-pay

## 2019-10-12 LAB — COMPREHENSIVE METABOLIC PANEL
ALT: 21 U/L (ref 0–44)
ALT: 19 U/L (ref 0–44)
AST: 40 U/L (ref 15–41)
AST: 40 U/L (ref 15–41)
Albumin: 2.1 g/dL — ABNORMAL LOW (ref 3.5–5.0)
Albumin: 2.1 g/dL — ABNORMAL LOW (ref 3.5–5.0)
Alkaline Phosphatase: 79 U/L (ref 38–126)
Alkaline Phosphatase: 78 U/L (ref 38–126)
Anion gap: 9 (ref 5–15)
Anion gap: 10 (ref 5–15)
BUN: 29 mg/dL — ABNORMAL HIGH (ref 8–23)
BUN: 34 mg/dL — ABNORMAL HIGH (ref 8–23)
CO2: 31 mmol/L (ref 22–32)
CO2: 31 mmol/L (ref 22–32)
Calcium: 7.8 mg/dL — ABNORMAL LOW (ref 8.9–10.3)
Calcium: 7.8 mg/dL — ABNORMAL LOW (ref 8.9–10.3)
Chloride: 97 mmol/L — ABNORMAL LOW (ref 98–111)
Chloride: 95 mmol/L — ABNORMAL LOW (ref 98–111)
Creatinine, Ser: 1.23 mg/dL (ref 0.61–1.24)
Creatinine, Ser: 1.39 mg/dL — ABNORMAL HIGH (ref 0.61–1.24)
GFR calc Af Amer: >60 mL/min (ref >60)
GFR calc Af Amer: 59 mL/min — ABNORMAL LOW (ref >60)
GFR calc non Af Amer: 59 mL/min — ABNORMAL LOW (ref >60)
GFR calc non Af Amer: 51 mL/min — ABNORMAL LOW (ref >60)
Glucose, Bld: 146 mg/dL — ABNORMAL HIGH (ref 70–99)
Glucose, Bld: 136 mg/dL — ABNORMAL HIGH (ref 70–99)
Potassium: 3.4 mmol/L — ABNORMAL LOW (ref 3.5–5.1)
Potassium: 3.5 mmol/L (ref 3.5–5.1)
Sodium: 137 mmol/L (ref 135–145)
Sodium: 136 mmol/L (ref 135–145)
Total Bilirubin: 2 mg/dL — ABNORMAL HIGH (ref 0.3–1.2)
Total Bilirubin: 1.9 mg/dL — ABNORMAL HIGH (ref 0.3–1.2)
Total Protein: 6.3 g/dL — ABNORMAL LOW (ref 6.5–8.1)
Total Protein: 6.4 g/dL — ABNORMAL LOW (ref 6.5–8.1)

## 2019-10-12 LAB — GLUCOSE, CAPILLARY
Glucose-Capillary: 135 mg/dL — ABNORMAL HIGH (ref 70–99)
Glucose-Capillary: 128 mg/dL — ABNORMAL HIGH (ref 70–99)
Glucose-Capillary: 129 mg/dL — ABNORMAL HIGH (ref 70–99)
Glucose-Capillary: 126 mg/dL — ABNORMAL HIGH (ref 70–99)

## 2019-10-12 LAB — CBC WITH DIFFERENTIAL/PLATELET
Abs Immature Granulocytes: 0.05 10*3/uL (ref 0.00–0.07)
Abs Immature Granulocytes: 0.06 10*3/uL (ref 0.00–0.07)
Basophils Absolute: 0 10*3/uL (ref 0.0–0.1)
Basophils Absolute: 0 10*3/uL (ref 0.0–0.1)
Basophils Relative: 0 %
Basophils Relative: 0 %
Eosinophils Absolute: 0.4 10*3/uL (ref 0.0–0.5)
Eosinophils Absolute: 0.4 10*3/uL (ref 0.0–0.5)
Eosinophils Relative: 4 %
Eosinophils Relative: 4 %
HCT: 25.2 % — ABNORMAL LOW (ref 39.0–52.0)
HCT: 25 % — ABNORMAL LOW (ref 39.0–52.0)
Hemoglobin: 7.9 g/dL — ABNORMAL LOW (ref 13.0–17.0)
Hemoglobin: 7.8 g/dL — ABNORMAL LOW (ref 13.0–17.0)
Immature Granulocytes: 1 %
Immature Granulocytes: 1 %
Lymphocytes Relative: 12 %
Lymphocytes Relative: 14 %
Lymphs Abs: 1 10*3/uL (ref 0.7–4.0)
Lymphs Abs: 1.2 10*3/uL (ref 0.7–4.0)
MCH: 30.3 pg (ref 26.0–34.0)
MCH: 30.5 pg (ref 26.0–34.0)
MCHC: 31.3 g/dL (ref 30.0–36.0)
MCHC: 31.2 g/dL (ref 30.0–36.0)
MCV: 96.6 fL (ref 80.0–100.0)
MCV: 97.7 fL (ref 80.0–100.0)
Monocytes Absolute: 1.3 10*3/uL — ABNORMAL HIGH (ref 0.1–1.0)
Monocytes Absolute: 1.1 10*3/uL — ABNORMAL HIGH (ref 0.1–1.0)
Monocytes Relative: 15 %
Monocytes Relative: 13 %
Neutro Abs: 6.2 10*3/uL (ref 1.7–7.7)
Neutro Abs: 5.9 10*3/uL (ref 1.7–7.7)
Neutrophils Relative %: 68 %
Neutrophils Relative %: 68 %
Platelets: 199 10*3/uL (ref 150–400)
Platelets: 224 10*3/uL (ref 150–400)
RBC: 2.61 MIL/uL — ABNORMAL LOW (ref 4.22–5.81)
RBC: 2.56 MIL/uL — ABNORMAL LOW (ref 4.22–5.81)
RDW: 21.6 % — ABNORMAL HIGH (ref 11.5–15.5)
RDW: 21.2 % — ABNORMAL HIGH (ref 11.5–15.5)
Smear Review: NORMAL
Smear Review: NORMAL
WBC: 9 10*3/uL (ref 4.0–10.5)
WBC: 8.8 10*3/uL (ref 4.0–10.5)
nRBC: 0 % (ref 0.0–0.2)
nRBC: 0 % (ref 0.0–0.2)

## 2019-10-12 LAB — MAGNESIUM: Magnesium: 1.6 mg/dL — ABNORMAL LOW (ref 1.7–2.4)

## 2019-10-12 LAB — OCCULT BLOOD X 1 CARD TO LAB, STOOL: Fecal Occult Bld: POSITIVE — AB

## 2019-10-12 LAB — PROTIME-INR
INR: 1.9 — ABNORMAL HIGH (ref 0.8–1.2)
Prothrombin Time: 21.4 seconds — ABNORMAL HIGH (ref 11.4–15.2)

## 2019-10-12 LAB — PHOSPHORUS: Phosphorus: 1.7 mg/dL — ABNORMAL LOW (ref 2.5–4.6)

## 2019-10-12 MED ORDER — POTASSIUM CITRATE ER 10 MEQ (1080 MG) PO TBCR
10.0000 meq | EXTENDED_RELEASE_TABLET | Freq: Three times a day (TID) | ORAL | Status: DC
  Administered 2019-10-12 – 2019-10-15 (×8): 10 meq via ORAL
  Filled 2019-10-12 (×13): qty 1

## 2019-10-12 MED ORDER — MAGNESIUM SULFATE 2 GM/50ML IV SOLN
2.0000 g | Freq: Once | INTRAVENOUS | Status: AC
  Administered 2019-10-12: 2 g via INTRAVENOUS
  Filled 2019-10-12: qty 50

## 2019-10-12 MED ORDER — PANTOPRAZOLE SODIUM 40 MG IV SOLR
40.0000 mg | Freq: Two times a day (BID) | INTRAVENOUS | Status: DC
  Administered 2019-10-12 – 2019-10-23 (×22): 40 mg via INTRAVENOUS
  Filled 2019-10-12 (×22): qty 40

## 2019-10-12 MED ORDER — SODIUM CHLORIDE 0.9 % IV SOLN
2.0000 g | INTRAVENOUS | Status: DC
  Administered 2019-10-12 – 2019-10-15 (×3): 2 g via INTRAVENOUS
  Filled 2019-10-12 (×4): qty 20
  Filled 2019-10-12: qty 2

## 2019-10-12 MED ORDER — MORPHINE SULFATE (PF) 2 MG/ML IV SOLN
2.0000 mg | Freq: Once | INTRAVENOUS | Status: AC
  Administered 2019-10-12: 2 mg via INTRAVENOUS
  Filled 2019-10-12: qty 1

## 2019-10-12 MED ORDER — MORPHINE SULFATE (PF) 2 MG/ML IV SOLN
2.0000 mg | Freq: Once | INTRAVENOUS | Status: DC
  Filled 2019-10-12: qty 1

## 2019-10-12 MED ORDER — MORPHINE SULFATE (PF) 2 MG/ML IV SOLN
2.0000 mg | INTRAVENOUS | Status: DC | PRN
  Administered 2019-10-12 – 2019-10-16 (×11): 2 mg via INTRAVENOUS
  Filled 2019-10-12 (×11): qty 1

## 2019-10-12 MED ORDER — ALPRAZOLAM 0.25 MG PO TABS
0.2500 mg | ORAL_TABLET | Freq: Once | ORAL | Status: AC
  Administered 2019-10-12: 0.25 mg via ORAL
  Filled 2019-10-12: qty 1

## 2019-10-12 NOTE — Progress Notes (Addendum)
Christian Berg Kitchen  PROGRESS NOTE    Christian Berg  IOX:[id number] DOB: 01-Aug-1950 DOA: 10/08/2019 PCP: Birdie Sons, MD   Brief Narrative: HPI per Dr. Gala Romney on 10/09/19 Christian Berg is a 69 y.o. male with medical history significant of diabetes, hypertension, diastolic dysfunction CHF, disseminated superficial actinic porokeratosis, degenerative disc disease, recent bleeding episodes, atrial fibrillation and peripheral vascular disease who presented to the ER with left lower extremity ulcer.  He already has a right lower extremity ulcer.  He came in for wound check.  Patient has noted increasing weight about 25 pounds more in the last few weeks.  He is taking diuretics at home.  He also has noted increasing shortness of breath.  He has bruising easily and was told to stop his Coumadin about a week ago.  Patient was seen in the ER in addition to his leg wounds was found to be having episode of anasarca.  Appears he has acute exacerbation of his diastolic CHF so he is being admitted to the hospital for further evaluation and treatment..  ED Course: Temperature is 99.1 blood pressure 130/47 pulse 140 respirate 26 oxygen sats 94% on room air.  White count 9.0 hemoglobin 9.3 and platelet 235.  Chemistry appears to be within normal except for creatinine 1.38 and calcium 8.4.  Glucose 145.  COVID-19 screen is negative.  Chest x-ray showed evidence of mild fluid overload.  X-ray of the tibia-fibula shows no evidence of osseous abnormality.  Patient being admitted with acute exacerbation of CHF and also possible infected wound.  **Interim History  Is started on IV Lasix and this was changed to Lasix drip.  Wound care was consulted for the left lower extremity and cefepime was started for infection.  He started having significant amount of diarrhea but clinically do not suspect C. difficile unlikely this is antibiotic mediated diarrhea.  He is currently afebrile and has no leukocytosis.  Still remains very  swollen so cardiology was consulted for further evaluation as he remains on Lasix drip.   Assessment & Plan:   Principal Problem:   (HFpEF) heart failure with preserved ejection fraction (HCC) Active Problems:   Diabetes mellitus ([place])   Benign essential HTN   Benign prostatic hyperplasia with urinary obstruction   Chronic atrial fibrillation (HCC)   Morbid (severe) obesity due to excess calories (HCC)   NASH (nonalcoholic steatohepatitis)   Bilateral carotid artery stenosis   CKD stage 3 due to type 2 diabetes mellitus (HCC)   Acute exacerbation of CHF (congestive heart failure) (HCC)   Leg wound, left, initial encounter  Acute on Chronic Diastolic Congestive Heart Failure. -Patient presented with evidence of bichamber congestive heart failure.  Severe volume overload with anasarca. -BNP on admission was 399.1 -Reviewed patient most recent echocardiogram performed in 08/05/2019, ejection fraction was normal. -No significant pulmonary hypertension. -Currently on lasix infusion with fair urine output -Continues to have evidence of volume overload. -Monitor electrolytes and renal function. -I's and O's and daily weights; patient is -3.827 L since admission and has gone from 300 pounds to 312 pounds request that this is accurate -Continue to monitor for signs and symptoms of volume overload -Continue with metoprolol tartrate 100 g p.o. twice daily -Cardiology was consulted for further evaluation  Left leg wound with cellulitis in the setting of DSAP -Patient has a bilateral leg edema appears to be secondary to venous stasis and congestive heart failure.   -Posterior left leg also has ulcer, appears to be infected on admission.  Appears to be resolved  -Wound care has been consulted.  will d/c cefepime given apparent resolution of cellulitis and now diarrhea possibly antibiotic-associated  Diarrhea, bloody Plan likely antibiotic mediated next-hold all laxatives -gi pathogen panel  negative Insert Flexi-Seal given his amount of diarrhea - consulted ID for c diff testing, advised against given afebrile, no leukocytosis -hemoccult positive, will consult ID. Holding aspirin and lovenox - repeat inr - transfer to progressive bed - appreciate GI recs. Start ceftriaxone, tentative plan for EGD tomorrow. NPO after midnight  Chronic Atrial fibrillation with rapid ventricular response. -Patient was taken off of warfarin recently due to concern of skin bleeding.  Patient has some tachycardia, continue beta-blocker. -INR prior to admission was >5 - INR 2 but with ifobt positive, holding on anticoagulation   Type 2 Diabetes Mellitus -Continue to Hold Metformin -Continue sliding scale insulin.  -CBG's low 100s   NASH liver cirrhosis. -Continue to monitor. -Check LFTs in the morning  Hypokalemia -In the setting of the Lasix drip and diarrhea -The Patient's potassium this morning was 3.4, mg 1.6 -Replete with p.o. potassium chloride 40 mg tid and MG 2 mg IV -Continue to monitor and treat as necessary  Chronic kidney disease stage IIIa. - stable  Essential Hypertension. - stable  Normocytic Anemia/Anemia of Chronic Kidney Disease - see above. Holding asa/lovenox, investigating C diff testing, GI consult ordered  Morbid Obesity -Estimated body mass index is 47.53 kg/m as calculated from the following:   Height as of this encounter: 5' 8"  (1.727 m).   Weight as of this encounter: 141.8 kg. -Weight Loss and Dietary Counseling given   DVT prophylaxis: holding  Code Status: FULL CODE  Family Communication: No family present at bedside  Disposition Plan: guarded. Appreciate GI and cardiology consults. Transfer to progressive bed  Status is: Inpatient  Remains inpatient appropriate because:Unsafe d/c plan, IV treatments appropriate due to intensity of illness or inability to take PO and Inpatient level of care appropriate due to severity of  illness   Dispo: The patient is from: Home              Anticipated d/c is to: TBD              Anticipated d/c date is: 2 days              Patient currently is not medically stable to d/c.  Consultants:   Cardiology   Procedures: None  Antimicrobials:  Anti-infectives (From admission, onward)   Start     Dose/Rate Route Frequency Ordered Stop   10/11/19 2100  ceFEPIme (MAXIPIME) 2 g in sodium chloride 0.9 % 100 mL IVPB  Status:  Discontinued        2 g 200 mL/hr over 30 Minutes Intravenous Every 8 hours 10/10/19 1853 10/10/19 1859   10/10/19 1900  ceFEPIme (MAXIPIME) 2 g in sodium chloride 0.9 % 100 mL IVPB        2 g 200 mL/hr over 30 Minutes Intravenous Every 8 hours 10/10/19 1859     10/09/19 0000  ceFEPIme (MAXIPIME) 2 g in sodium chloride 0.9 % 100 mL IVPB  Status:  Discontinued        2 g 200 mL/hr over 30 Minutes Intravenous Every 8 hours 10/08/19 2346 10/10/19 1853        Subjective: Seen and examined at bedside and he was not feeling well and felt no real improvement.  Continues to complain of significant amount of diarrhea and states that  is constant.  States his legs are still swollen and that his leg hurts.  No chest pain, lightheadedness or dizziness.  Objective: Vitals:   10/11/19 0907 10/11/19 1307 10/11/19 2054 10/12/19 0430  BP: 129/84 120/71 (!) 145/81 118/65  Pulse: (!) 106 (!) 102 (!) 107 86  Resp:   20 20  Temp:  97.9 F (36.6 C) 98.5 F (36.9 C) 98.5 F (36.9 C)  TempSrc:   Oral Oral  SpO2:  95% 99% 100%  Weight:      Height:        Intake/Output Summary (Last 24 hours) at 10/12/2019 0732 Last data filed at 10/12/2019 0500 Gross per 24 hour  Intake 911.17 ml  Output 600 ml  Net 311.17 ml   Filed Weights   10/08/19 1652 10/10/19 1436 10/11/19 0500  Weight: 136.1 kg (!) 139.3 kg (!) 141.8 kg   Examination: Physical Exam:  Constitutional: WN/WD morbidly obese Caucasian male currently in no acute distress but appears slightly anxious  and uncomfortable Eyes: Lids and conjunctivae normal, sclerae anicteric  ENMT: External Ears, Nose appear normal. Grossly normal hearing. Neck: Appears normal, supple, no cervical masses, normal ROM, no appreciable thyromegaly; some JVD Respiratory: Diminished to auscultation bilaterally with coarse breath sounds, no wheezing, rales, rhonchi or crackles. Normal respiratory effort and patient is not tachypenic. No accessory muscle use and has unlabored breathing.   Cardiovascular: Irregularly irregular and slightly tachycardic, no murmurs / rubs / gallops.  1-2+ lower extremity edema Abdomen: Soft, non-tender, distended secondary body habitus.  Bowel sounds positive.  GU: Deferred. Musculoskeletal: No clubbing / cyanosis of digits/nails. No joint deformity upper and lower extremities.  Skin: Has multiple skin lesions in the lower extremities and he states that this is disseminated superficial actinic porokeratosis. No induration; Warm and dry.  Neurologic: CN 2-12 grossly intact with no focal deficits. Romberg sign and cerebellar reflexes not assessed.  Psychiatric: Normal judgment and insight. Alert and oriented x 3.  Anxious mood and appropriate affect.   Data Reviewed: I have personally reviewed following labs and imaging studies  CBC: Recent Labs  Lab 10/08/19 1706 10/09/19 0801 10/10/19 0701 10/11/19 0522 10/12/19 0427  WBC 9.0 9.8 9.9 8.8 9.0  NEUTROABS  --  7.8* 7.5  --  6.2  HGB 9.3* 8.7* 9.7* 8.6* 7.9*  HCT 29.3* 27.2* 29.0* 26.3* 25.2*  MCV 95.8 95.1 92.9 94.3 96.6  PLT 235 216 205 206 357   Basic Metabolic Panel: Recent Labs  Lab 10/08/19 1706 10/09/19 0801 10/10/19 0701 10/11/19 0522 10/12/19 0427  NA 138 137 137 136 137  K 3.5 3.6 3.7 3.2* 3.4*  CL 100 99 98 97* 97*  CO2 26 25 28 28 31   GLUCOSE 145* 150* 145* 142* 146*  BUN 14 15 18 19  29*  CREATININE 1.38* 1.41* 1.53* 1.44* 1.23  CALCIUM 8.4* 8.5* 8.5* 8.1* 7.8*  MG  --   --  1.7  --  1.6*  PHOS  --   --    --   --  1.7*   GFR: Estimated Creatinine Clearance: 77.3 mL/min (by C-G formula based on SCr of 1.23 mg/dL). Liver Function Tests: Recent Labs  Lab 10/08/19 1706 10/12/19 0427  AST 51* 40  ALT 19 21  ALKPHOS 121 79  BILITOT 2.0* 2.0*  PROT 7.3 6.3*  ALBUMIN 2.7* 2.1*   No results for input(s): LIPASE, AMYLASE in the last 168 hours. No results for input(s): AMMONIA in the last 168 hours. Coagulation Profile: Recent Labs  Lab 10/11/19 0522  INR 2.0*   Cardiac Enzymes: No results for input(s): CKTOTAL, CKMB, CKMBINDEX, TROPONINI in the last 168 hours. BNP (last 3 results) No results for input(s): PROBNP in the last 8760 hours. HbA1C: Recent Labs    10/09/19 0801  HGBA1C 5.7*   CBG: Recent Labs  Lab 10/10/19 2055 10/11/19 0803 10/11/19 1304 10/11/19 1703 10/11/19 2217  GLUCAP 154* 132* 141* 136* 145*   Lipid Profile: No results for input(s): CHOL, HDL, LDLCALC, TRIG, CHOLHDL, LDLDIRECT in the last 72 hours. Thyroid Function Tests: No results for input(s): TSH, T4TOTAL, FREET4, T3FREE, THYROIDAB in the last 72 hours. Anemia Panel: Recent Labs    10/09/19 1404  VITAMINB12 466   Sepsis Labs: No results for input(s): PROCALCITON, LATICACIDVEN in the last 168 hours.  Recent Results (from the past 240 hour(s))  SARS Coronavirus 2 by RT PCR (hospital order, performed in [facility] hospital lab) Nasopharyngeal Nasopharyngeal Swab     Status: None   Collection Time: 10/09/19 12:02 AM   Specimen: Nasopharyngeal Swab  Result Value Ref Range Status   SARS Coronavirus 2 NEGATIVE NEGATIVE Final    Comment: (NOTE) SARS-CoV-2 target nucleic acids are NOT DETECTED.  The SARS-CoV-2 RNA is generally detectable in upper and lower respiratory specimens during the acute phase of infection. The lowest concentration of SARS-CoV-2 viral copies this assay can detect is 250 copies / mL. A negative result does not preclude SARS-CoV-2 infection and should not be used as the  sole basis for treatment or other patient management decisions.  A negative result may occur with improper specimen collection / handling, submission of specimen other than nasopharyngeal swab, presence of viral mutation(s) within the areas targeted by this assay, and inadequate number of viral copies (<250 copies / mL). A negative result must be combined with clinical observations, patient history, and epidemiological information.  Fact Sheet for Patients:   StrictlyIdeas.no  Fact Sheet for Healthcare Providers: BankingDealers.co.za  This test is not yet approved or  cleared by the [place] FDA and has been authorized for detection and/or diagnosis of SARS-CoV-2 by FDA under an Emergency Use Authorization (EUA).  This EUA will remain in effect (meaning this test can be used) for the duration of the COVID-19 declaration under Section 564(b)(1) of the Act, 21 U.S.C. section 360bbb-3(b)(1), unless the authorization is terminated or revoked sooner.  Performed at [facility], [address] [place], [place] [address]   Gastrointestinal Panel by PCR , Stool     Status: None   Collection Time: 10/11/19  4:07 PM   Specimen: Stool  Result Value Ref Range Status   Campylobacter species NOT DETECTED NOT DETECTED Final   Plesimonas shigelloides NOT DETECTED NOT DETECTED Final   Salmonella species NOT DETECTED NOT DETECTED Final   Yersinia enterocolitica NOT DETECTED NOT DETECTED Final   Vibrio species NOT DETECTED NOT DETECTED Final   Vibrio cholerae NOT DETECTED NOT DETECTED Final   Enteroaggregative E coli (EAEC) NOT DETECTED NOT DETECTED Final   Enteropathogenic E coli (EPEC) NOT DETECTED NOT DETECTED Final   Enterotoxigenic E coli (ETEC) NOT DETECTED NOT DETECTED Final   Shiga like toxin producing E coli (STEC) NOT DETECTED NOT DETECTED Final   Shigella/Enteroinvasive E coli (EIEC) NOT DETECTED NOT DETECTED Final    Cryptosporidium NOT DETECTED NOT DETECTED Final   Cyclospora cayetanensis NOT DETECTED NOT DETECTED Final   Entamoeba histolytica NOT DETECTED NOT DETECTED Final   Giardia lamblia NOT DETECTED NOT DETECTED Final  Adenovirus F40/41 NOT DETECTED NOT DETECTED Final   Astrovirus NOT DETECTED NOT DETECTED Final   Norovirus GI/GII NOT DETECTED NOT DETECTED Final   Rotavirus A NOT DETECTED NOT DETECTED Final   Sapovirus (I, II, IV, and V) NOT DETECTED NOT DETECTED Final    Comment: Performed at [facility], [address] [place], [place] [address]    RN Pressure Injury Documentation:     Estimated body mass index is 47.53 kg/m as calculated from the following:   Height as of this encounter: 5' 8"  (1.727 m).   Weight as of this encounter: 141.8 kg.  Malnutrition Type:      Malnutrition Characteristics:      Nutrition Interventions:    Radiology Studies: DG Chest 1 View  Result Date: 10/12/2019 CLINICAL DATA:  Shortness of breath. EXAM: CHEST  1 VIEW COMPARISON:  October 08, 2019 FINDINGS: There is a dual lead AICD. Mild diffusely increased interstitial lung markings are seen the which is predominant stable in severity when compared to the prior exam. Mild to moderate severity prominence of the perihilar pulmonary vasculature is noted which represents a new finding. There is no evidence of a pleural effusion or pneumothorax. Stable moderate to marked severity enlargement of the cardiac silhouette is seen. A radiopaque fixation plate and multiple fixation screws are seen within the proximal left humerus. IMPRESSION: 1. Stable cardiomegaly with mild to moderate severity pulmonary vascular congestion. Electronically Signed   By: Virgina Norfolk M.D.   On: 10/12/2019 02:13     Scheduled Meds: . aspirin EC  81 mg Oral QPM  . enoxaparin (LOVENOX) injection  40 mg Subcutaneous Q12H  . hydrocerin   Topical Daily  . insulin aspart  0-15 Units Subcutaneous TID WC  .  metoprolol tartrate  100 mg Oral BID  . pravastatin  20 mg Oral q1800  . sodium chloride flush  3 mL Intravenous Q12H   Continuous Infusions: . sodium chloride    . ceFEPime (MAXIPIME) IV Stopped (10/12/19 0228)  . furosemide (LASIX) infusion 8 mg/hr (10/12/19 0406)    LOS: 4 days   Desma Maxim, MD [facility] PAGER is on AMION  If 7PM-7AM, please contact night-coverage [email or web address]

## 2019-10-12 NOTE — Progress Notes (Signed)
[facility] Cardiology    SUBJECTIVE: Patient reports that he feels drowsy this morning. He denies chest pain. He is unsure if his breathing is better.    Vitals:   10/11/19 0907 10/11/19 1307 10/11/19 2054 10/12/19 0430  BP: 129/84 120/71 (!) 145/81 118/65  Pulse: (!) 106 (!) 102 (!) 107 86  Resp:   20 20  Temp:  97.9 F (36.6 C) 98.5 F (36.9 C) 98.5 F (36.9 C)  TempSrc:   Oral Oral  SpO2:  95% 99% 100%  Weight:      Height:         Intake/Output Summary (Last 24 hours) at 10/12/2019 0849 Last data filed at 10/12/2019 0500 Gross per 24 hour  Intake 911.17 ml  Output 1500 ml  Net -588.83 ml      PHYSICAL EXAM  General: morbidly obese gentleman lying in bed at a 30 degree angle in no acute distress.  HEENT:  Normocephalic and atramatic Neck:   No JVD.  Lungs: Clear bilaterally to auscultation. Normal effort of breathing on supplemental oxygen via [place] Heart: HRRR . Normal S1 and S2 without gallops or murmurs.  Msk:  Back normal, gait not assessed.  Extremities: trace bilateral pedal edema Neuro: Alert and oriented Psych: drowsy, answers questions appropriately    LABS: Basic Metabolic Panel: Recent Labs    10/10/19 0701 10/10/19 0701 10/11/19 0522 10/12/19 0427  NA 137   < > 136 137  K 3.7   < > 3.2* 3.4*  CL 98   < > 97* 97*  CO2 28   < > 28 31  GLUCOSE 145*   < > 142* 146*  BUN 18   < > 19 29*  CREATININE 1.53*   < > 1.44* 1.23  CALCIUM 8.5*   < > 8.1* 7.8*  MG 1.7  --   --  1.6*  PHOS  --   --   --  1.7*   < > = values in this interval not displayed.   Liver Function Tests: Recent Labs    10/12/19 0427  AST 40  ALT 21  ALKPHOS 79  BILITOT 2.0*  PROT 6.3*  ALBUMIN 2.1*   No results for input(s): LIPASE, AMYLASE in the last 72 hours. CBC: Recent Labs    10/10/19 0701 10/10/19 0701 10/11/19 0522 10/12/19 0427  WBC 9.9   < > 8.8 9.0  NEUTROABS 7.5  --   --  6.2  HGB 9.7*   < > 8.6* 7.9*  HCT 29.0*   < > 26.3* 25.2*  MCV 92.9   < > 94.3 96.6   PLT 205   < > 206 199   < > = values in this interval not displayed.   Cardiac Enzymes: No results for input(s): CKTOTAL, CKMB, CKMBINDEX, TROPONINI in the last 72 hours. BNP: Invalid input(s): POCBNP D-Dimer: No results for input(s): DDIMER in the last 72 hours. Hemoglobin A1C: No results for input(s): HGBA1C in the last 72 hours. Fasting Lipid Panel: No results for input(s): CHOL, HDL, LDLCALC, TRIG, CHOLHDL, LDLDIRECT in the last 72 hours. Thyroid Function Tests: No results for input(s): TSH, T4TOTAL, T3FREE, THYROIDAB in the last 72 hours.  Invalid input(s): FREET3 Anemia Panel: Recent Labs    10/09/19 1404  VITAMINB12 466    DG Chest 1 View  Result Date: 10/12/2019 CLINICAL DATA:  Shortness of breath. EXAM: CHEST  1 VIEW COMPARISON:  October 08, 2019 FINDINGS: There is a dual lead AICD. Mild diffusely increased interstitial  lung markings are seen the which is predominant stable in severity when compared to the prior exam. Mild to moderate severity prominence of the perihilar pulmonary vasculature is noted which represents a new finding. There is no evidence of a pleural effusion or pneumothorax. Stable moderate to marked severity enlargement of the cardiac silhouette is seen. A radiopaque fixation plate and multiple fixation screws are seen within the proximal left humerus. IMPRESSION: 1. Stable cardiomegaly with mild to moderate severity pulmonary vascular congestion. Electronically Signed   By: Virgina Norfolk M.D.   On: 10/12/2019 02:13     Echo LVEF 60-65%, no wall motion abnormalities  TELEMETRY: atrial fibrillation, rate 106-110s  ASSESSMENT AND PLAN:  Principal Problem:   (HFpEF) heart failure with preserved ejection fraction (HCC) Active Problems:   Diabetes mellitus ([place])   Benign essential HTN   Benign prostatic hyperplasia with urinary obstruction   Chronic atrial fibrillation (HCC)   Morbid (severe) obesity due to excess calories (HCC)   NASH  (nonalcoholic steatohepatitis)   Bilateral carotid artery stenosis   CKD stage 3 due to type 2 diabetes mellitus (HCC)   Acute exacerbation of CHF (congestive heart failure) (HCC)   Leg wound, left, initial encounter    1. Acute on chronic diastolic CHF, presenting with weight gain, progressive exertional dyspnea, peripheral edema, and orthopnea, with anasarca, currently on Lasix drip. Weight 133.3 kg today, down from 141.8 kg on 9/14. 2. Chronic atrial fibrillation, on metoprolol tartrate 100 mg twice daily for rate control, warfarin on hold due to bleeding, and now hemoccult possitve. Elevated ventricular rate to 106-110s bpm, patient asymptomatic. 3. Anemia, hemoglobin down to 7.9 today (8.6 yesterday) 4. Diarrhea, likely due to antibiotic, GI panel pending, GI following 5. NASH 6. Hypokalemia, secondary to diarrhea and Lasix; being supplemented.  Recommendations: 1.  Continue Lasix drip with careful monitoring of renal status, I&O's, and daily weight.  2.  Management of GI bleed per GI 3.  Continue metoprolol titrate for rate control 4.  Continue to hold warfarin in light of GI bleed    Clabe Seal, PA-C 10/12/2019 8:49 AM

## 2019-10-12 NOTE — TOC Progression Note (Signed)
Transition of Care [facility]) - Progression Note    Patient Details  Name: Christian Berg MRN: [id number] Date of Birth: 1950-08-23  Transition of Care [facility]) CM/SW Contact  Beverly Sessions, RN Phone Number: 10/12/2019, 10:39 AM  Clinical Narrative:     Confirmed with patient he is agreeable to home health services and does not have a preference of home health agency.  Referral made and accepted by Helene Kelp at [place] at [facility] for RN and PT  Expected Discharge Plan: [place] Barriers to Discharge: Continued Medical Work up  Expected Discharge Plan and Services Expected Discharge Plan: [place] arrangements for the past 2 months: [place]: RN, PT [facility] Agency: [facility] [employer]) Date [place]: 10/12/19   Representative spoke with at [place]: [place] ([place]) Interventions    Readmission Risk Interventions Readmission Risk Prevention Plan 10/12/2019  Transportation Screening Complete  PCP or Specialist Appt within 3-5 Days (No Data)  [place] or [place] Complete  Palliative Care Screening Not Applicable  Medication Review (RN Care Manager) Complete  Some recent data might be hidden

## 2019-10-12 NOTE — Consult Note (Addendum)
Consultation  Referring Provider:     Dr. Si Raider Admit date: 9/11 Consult date: 9/15         Reason for Consultation:     Diarrhea, anemia, FOBT positive         HPI:   Christian Berg is a 69 y.o. male gentleman with PMH history of compensated NASH cirrhosis, diastolic dysfunction, peripheral vascular disease with chronic lower extremity wounds, and DM II who has been admitted to the hospital with heart failure exacerbation. History is difficult to obtain due to being somewhat somnolent but on history, patient noted diarrhea that started after antibiotic administration. This required placement of fecal management system. He denies EGD/Colonoscopy to me. On inspection of stool, appears melenic in color. On presentation patient's inr was > 5 and has trended down to 1.9 today. He denies NSAID use and has been off his coumadin for over a week. He denies any abdominal pain, nausea, vomiting, or hematochezia. Patient's wife was present on second interview and has been noting dark stool for about one week but was mostly formed. On chart review he had ED evaluation in July for black foul smelling stools. Also noted at outpatient evaluation earlier this month. Patient complaining of lower extremity pain at site of chronic wound. Unknown family history of any GI malignancies.  Past Medical History:  Diagnosis Date  . A-fib ([place])   . Carotid stenosis   . CHF (congestive heart failure) ([place])   . Degenerative disc disease, lumbar    s/p injury  . Diabetes mellitus without complication ([place])   . Disseminated superficial actinic porokeratosis   . Dysrhythmia    A-FIB, palpatations  . 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 Fatty liver   . History of degenerative disc disease   . History of kidney stones   . Hypercholesteremia   . Hypertension   . Kidney stones   . Obesity   . Pacemaker    inactive  . Peripheral vascular disease ([place])    Carotid stenosis  . Presence of permanent cardiac pacemaker    Inactive  .  TIA (transient ischemic attack) 2011   No deficits  . TIA (transient ischemic attack)   . Vitamin B12 deficiency     Past Surgical History:  Procedure Laterality Date  . APPENDECTOMY  2004  . CARDIAC CATHETERIZATION    . CARDIAC PACEMAKER PLACEMENT  2005  . CATARACT EXTRACTION W/PHACO Right 06/14/2014   Procedure: CATARACT EXTRACTION PHACO AND INTRAOCULAR LENS PLACEMENT (IOC);  Surgeon: Leandrew Koyanagi, MD;  Location: [place];  Service: Ophthalmology;  Laterality: Right;  . CATARACT EXTRACTION W/PHACO Left 10/27/2018   Procedure: CATARACT EXTRACTION PHACO AND INTRAOCULAR LENS PLACEMENT (IOC) LEFT DIABETIC  00:46.4  18.6%  8.63;  Surgeon: Leandrew Koyanagi, MD;  Location: [place];  Service: Ophthalmology;  Laterality: Left;  Diabetic - oral meds  . CYSTOSCOPY W/ RETROGRADES Bilateral 08/09/2014   Procedure: CYSTOSCOPY WITH RETROGRADE PYELOGRAM;  Surgeon: Hollice Espy, MD;  Location: [facility];  Service: Urology;  Laterality: Bilateral;  . INTUBATION-ENDOTRACHEAL WITH TRACHEOSTOMY STANDBY  03/30/2017   Procedure: INTUBATION-ENDOTRACHEAL WITH TRACHEOSTOMY STANDBY;  Surgeon: Carloyn Manner, MD;  Location: [facility];  Service: ENT;;    Family History  Adopted: Yes  Problem Relation Age of Onset  . Heart disease Mother   . Fibromyalgia Sister   . Hypertension Daughter   . Migraines Daughter   . Kidney disease Neg Hx   . Prostate cancer Neg Hx   . Bladder Cancer Neg Hx  Social History   Tobacco Use  . Smoking status: Never Smoker  . Smokeless tobacco: Never Used  Vaping Use  . Vaping Use: Never used  Substance Use Topics  . Alcohol use: No    Alcohol/week: 0.0 standard drinks  . Drug use: No    Prior to Admission medications   Medication Sig Start Date End Date Taking? Authorizing Provider  albuterol (VENTOLIN HFA) 108 (90 Base) MCG/ACT inhaler Inhale 2 puffs into the lungs every 6 (six) hours as needed for wheezing or shortness of breath.  05/31/19  Yes Birdie Sons, MD  aspirin EC 81 MG tablet Take 81 mg by mouth every evening.    Yes [provider]  cephALEXin (KEFLEX) 500 MG capsule Take 1 capsule (500 mg total) by mouth 4 (four) times daily for 10 days. 10/04/19 10/14/19 Yes Birdie Sons, MD  ferrous sulfate 325 (65 FE) MG tablet Take 325 mg by mouth daily with breakfast.   Yes [provider]  furosemide (LASIX) 20 MG tablet Take 1 tablet by mouth once daily Patient taking differently: Take 60 mg by mouth daily.  06/18/19  Yes Birdie Sons, MD  lisinopril (ZESTRIL) 20 MG tablet Take 1 tablet (20 mg total) by mouth daily. 04/07/19  Yes Birdie Sons, MD  lovastatin (MEVACOR) 20 MG tablet Take 1 tablet (20 mg total) by mouth every evening. 04/08/19  Yes Birdie Sons, MD  metFORMIN (GLUCOPHAGE) 1000 MG tablet Take 1 tablet by mouth twice daily Patient taking differently: Take 500 mg by mouth 2 (two) times daily with a meal.  08/29/19  Yes Birdie Sons, MD  metoprolol tartrate (LOPRESSOR) 100 MG tablet Take 1 tablet (100 mg total) by mouth at bedtime. Patient taking differently: Take 100 mg by mouth 2 (two) times daily.  10/24/16  Yes Demetrios Loll, MD  traMADol-acetaminophen (ULTRACET) 37.5-325 MG tablet Take 1 tablet by mouth every 6 (six) hours as needed for severe pain. 08/06/19  Yes Wieting, Richard, MD  warfarin (COUMADIN) 5 MG tablet Take 1/2 tablet daily or as directed by physician 10/04/19  Yes Birdie Sons, MD  ketoconazole (NIZORAL) 2 % cream Apply 1 application topically daily as needed for irritation. Patient not taking: Reported on 10/09/2019 08/06/19   Loletha Grayer, MD  nystatin cream (MYCOSTATIN) Apply 1 application topically 2 (two) times daily. Patient not taking: Reported on 10/09/2019 07/17/14   Zara Council A, PA-C  spironolactone (ALDACTONE) 25 MG tablet Take 25 mg by mouth daily. Patient not taking: Reported on 10/09/2019 08/16/19   [provider]    Current  Facility-Administered Medications  Medication Dose Route Frequency Provider Last Rate Last Admin  . 0.9 %  sodium chloride infusion  250 mL Intravenous PRN Elwyn Reach, MD      . acetaminophen (TYLENOL) tablet 650 mg  650 mg Oral Q4H PRN Elwyn Reach, MD   650 mg at 10/11/19 2137  . albuterol (PROVENTIL) (2.5 MG/3ML) 0.083% nebulizer solution 3 mL  3 mL Inhalation Q6H PRN Sharen Hones, MD      . furosemide (LASIX) 250 mg in dextrose 5 % 250 mL (1 mg/mL) infusion  8 mg/hr Intravenous Continuous Sharion Settler, NP 8 mL/hr at 10/12/19 0406 8 mg/hr at 10/12/19 0406  . hydrocerin (EUCERIN) cream   Topical Daily Raiford Noble [place], [place]   Given at 10/11/19 1500  . insulin aspart (novoLOG) injection 0-15 Units  0-15 Units Subcutaneous TID WC Elwyn Reach, MD  2 Units at 10/12/19 0839  . metoprolol tartrate (LOPRESSOR) tablet 100 mg  100 mg Oral BID Sharen Hones, MD   100 mg at 10/11/19 2137  . ondansetron (ZOFRAN) injection 4 mg  4 mg Intravenous Q6H PRN Gala Romney L, MD      . potassium citrate (UROCIT-K) SR tablet 10 mEq  10 mEq Oral TID WC Wouk, Ailene Rud, MD   10 mEq at 10/12/19 0839  . pravastatin (PRAVACHOL) tablet 20 mg  20 mg Oral q1800 Sharen Hones, MD   20 mg at 10/11/19 1721  . sodium chloride flush (NS) 0.9 % injection 3 mL  3 mL Intravenous Q12H Gala Romney L, MD   3 mL at 10/10/19 1315  . sodium chloride flush (NS) 0.9 % injection 3 mL  3 mL Intravenous PRN Elwyn Reach, MD        Allergies as of 10/08/2019 - Review Complete 10/08/2019  Allergen Reaction Noted  . Clindamycin/lincomycin  11/03/2016  . Sulfa antibiotics Hives 05/31/2014  . Sulfasalazine Hives 05/31/2014     Review of Systems  Constitutional: Negative for chills, fever and weight loss.  Eyes: Negative for pain.  Respiratory: Positive for shortness of breath.   Cardiovascular: Negative for chest pain.  Gastrointestinal: Positive for diarrhea and melena. Negative for abdominal pain,  constipation, heartburn, nausea and vomiting.  Genitourinary: Negative for dysuria.  Musculoskeletal: Positive for joint pain.  Skin: Positive for rash.  Neurological: Positive for focal weakness.  Endo/Heme/Allergies: Bruises/bleeds easily.  Psychiatric/Behavioral: Positive for depression.      Physical Exam:  Vital signs in last 24 hours: Temp:  [97.9 F (36.6 C)-98.5 F (36.9 C)] 98.5 F (36.9 C) (09/15 1115) Pulse Rate:  [86-107] 105 (09/15 1115) Resp:  [16-20] 16 (09/15 1115) BP: (118-145)/(65-81) 127/77 (09/15 1115) SpO2:  [95 %-100 %] 99 % (09/15 1115) Weight:  [132.7 kg-133.3 kg] 132.7 kg (09/15 1115) Last BM Date: 10/12/19 General:   In mild distress Head:  Normocephalic and atraumatic. Eyes:   No icterus.    Mouth: Somewhat dry mucous membranes Neck:  Supple; no masses felt Lungs:  No respiratory distress Heart:  Slightly tachy rate Abdomen:   Non-tender, non-distended obese abdomen Rectal:  Melenic appearing stool in rectal tube  Msk:  Scattered excoriations. 2 + pulses Neurologic:  No focal deficits Skin:  Warm, dry, scattered psoriatic appearing rashes Psych:  Intermittently somnolent  LAB RESULTS: Recent Labs    10/10/19 0701 10/11/19 0522 10/12/19 0427  WBC 9.9 8.8 9.0  HGB 9.7* 8.6* 7.9*  HCT 29.0* 26.3* 25.2*  PLT 205 206 199   BMET Recent Labs    10/10/19 0701 10/11/19 0522 10/12/19 0427  NA 137 136 137  K 3.7 3.2* 3.4*  CL 98 97* 97*  CO2 28 28 31   GLUCOSE 145* 142* 146*  BUN 18 19 29*  CREATININE 1.53* 1.44* 1.23  CALCIUM 8.5* 8.1* 7.8*   LFT Recent Labs    10/12/19 0427  PROT 6.3*  ALBUMIN 2.1*  AST 40  ALT 21  ALKPHOS 79  BILITOT 2.0*   PT/INR Recent Labs    10/11/19 0522 10/12/19 1013  LABPROT 21.6* 21.4*  INR 2.0* 1.9*    STUDIES: DG Chest 1 View  Result Date: 10/12/2019 CLINICAL DATA:  Shortness of breath. EXAM: CHEST  1 VIEW COMPARISON:  October 08, 2019 FINDINGS: There is a dual lead AICD. Mild diffusely  increased interstitial lung markings are seen the which is predominant stable in severity when compared  to the prior exam. Mild to moderate severity prominence of the perihilar pulmonary vasculature is noted which represents a new finding. There is no evidence of a pleural effusion or pneumothorax. Stable moderate to marked severity enlargement of the cardiac silhouette is seen. A radiopaque fixation plate and multiple fixation screws are seen within the proximal left humerus. IMPRESSION: 1. Stable cardiomegaly with mild to moderate severity pulmonary vascular congestion. Electronically Signed   By: Virgina Norfolk M.D.   On: 10/12/2019 02:13       Impression / Plan:    # Melenic stool - broad differential given on going for several weeks but given history of cirrhosis, will need evaluation. INR currently 1.9, ideally would be closer to 1.5. Currently hemodynamically stable. - recommend maintaining active type and screen - trend hemoglobins - would start prophylactic antibiotics with ceftriaxone daily - maintain two large bore IV's - transfuse if hemoglobin < 7 - would place on clear liquid diet and make NPO at midnight - make sure electrolyte are replaced on morning labs - PPI IV BID - will tentatively plan on EGD tomorrow as long as INR continues to improve  # NASH Cirrhosis- no history of any varices but shunting noted on previously done CTA. Has normal platelets and liver enzymes except bilirubin is 2.0. MELD-Na score is currently 14 - please check daily  CMP and INR - as above would start prophylactic antibiotics  Please call if any questions or concerns.  Raylene Miyamoto MD, MPH [facility] Gastroenterology

## 2019-10-12 NOTE — H&P (View-Only) (Signed)
Consultation  Referring Provider:     Dr. Si Raider Admit date: 9/11 Consult date: 9/15         Reason for Consultation:     Diarrhea, anemia, FOBT positive         HPI:   Christian Berg is a 69 y.o. male gentleman with PMH history of compensated NASH cirrhosis, diastolic dysfunction, peripheral vascular disease with chronic lower extremity wounds, and DM II who has been admitted to the hospital with heart failure exacerbation. History is difficult to obtain due to being somewhat somnolent but on history, patient noted diarrhea that started after antibiotic administration. This required placement of fecal management system. He denies EGD/Colonoscopy to me. On inspection of stool, appears melenic in color. On presentation patient's inr was > 5 and has trended down to 1.9 today. He denies NSAID use and has been off his coumadin for over a week. He denies any abdominal pain, nausea, vomiting, or hematochezia. Patient's wife was present on second interview and has been noting dark stool for about one week but was mostly formed. On chart review he had ED evaluation in July for black foul smelling stools. Also noted at outpatient evaluation earlier this month. Patient complaining of lower extremity pain at site of chronic wound. Unknown family history of any GI malignancies.  Past Medical History:  Diagnosis Date  . A-fib ([place])   . Carotid stenosis   . CHF (congestive heart failure) ([place])   . Degenerative disc disease, lumbar    s/p injury  . Diabetes mellitus without complication ([place])   . Disseminated superficial actinic porokeratosis   . Dysrhythmia    A-FIB, palpatations  . 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 Fatty liver   . History of degenerative disc disease   . History of kidney stones   . Hypercholesteremia   . Hypertension   . Kidney stones   . Obesity   . Pacemaker    inactive  . Peripheral vascular disease ([place])    Carotid stenosis  . Presence of permanent cardiac pacemaker    Inactive  .  TIA (transient ischemic attack) 2011   No deficits  . TIA (transient ischemic attack)   . Vitamin B12 deficiency     Past Surgical History:  Procedure Laterality Date  . APPENDECTOMY  2004  . CARDIAC CATHETERIZATION    . CARDIAC PACEMAKER PLACEMENT  2005  . CATARACT EXTRACTION W/PHACO Right 06/14/2014   Procedure: CATARACT EXTRACTION PHACO AND INTRAOCULAR LENS PLACEMENT (IOC);  Surgeon: Leandrew Koyanagi, MD;  Location: [place];  Service: Ophthalmology;  Laterality: Right;  . CATARACT EXTRACTION W/PHACO Left 10/27/2018   Procedure: CATARACT EXTRACTION PHACO AND INTRAOCULAR LENS PLACEMENT (IOC) LEFT DIABETIC  00:46.4  18.6%  8.63;  Surgeon: Leandrew Koyanagi, MD;  Location: [place];  Service: Ophthalmology;  Laterality: Left;  Diabetic - oral meds  . CYSTOSCOPY W/ RETROGRADES Bilateral 08/09/2014   Procedure: CYSTOSCOPY WITH RETROGRADE PYELOGRAM;  Surgeon: Hollice Espy, MD;  Location: [facility];  Service: Urology;  Laterality: Bilateral;  . INTUBATION-ENDOTRACHEAL WITH TRACHEOSTOMY STANDBY  03/30/2017   Procedure: INTUBATION-ENDOTRACHEAL WITH TRACHEOSTOMY STANDBY;  Surgeon: Carloyn Manner, MD;  Location: [facility];  Service: ENT;;    Family History  Adopted: Yes  Problem Relation Age of Onset  . Heart disease Mother   . Fibromyalgia Sister   . Hypertension Daughter   . Migraines Daughter   . Kidney disease Neg Hx   . Prostate cancer Neg Hx   . Bladder Cancer Neg Hx  Social History   Tobacco Use  . Smoking status: Never Smoker  . Smokeless tobacco: Never Used  Vaping Use  . Vaping Use: Never used  Substance Use Topics  . Alcohol use: No    Alcohol/week: 0.0 standard drinks  . Drug use: No    Prior to Admission medications   Medication Sig Start Date End Date Taking? Authorizing Provider  albuterol (VENTOLIN HFA) 108 (90 Base) MCG/ACT inhaler Inhale 2 puffs into the lungs every 6 (six) hours as needed for wheezing or shortness of breath.  05/31/19  Yes Birdie Sons, MD  aspirin EC 81 MG tablet Take 81 mg by mouth every evening.    Yes [provider]  cephALEXin (KEFLEX) 500 MG capsule Take 1 capsule (500 mg total) by mouth 4 (four) times daily for 10 days. 10/04/19 10/14/19 Yes Birdie Sons, MD  ferrous sulfate 325 (65 FE) MG tablet Take 325 mg by mouth daily with breakfast.   Yes [provider]  furosemide (LASIX) 20 MG tablet Take 1 tablet by mouth once daily Patient taking differently: Take 60 mg by mouth daily.  06/18/19  Yes Birdie Sons, MD  lisinopril (ZESTRIL) 20 MG tablet Take 1 tablet (20 mg total) by mouth daily. 04/07/19  Yes Birdie Sons, MD  lovastatin (MEVACOR) 20 MG tablet Take 1 tablet (20 mg total) by mouth every evening. 04/08/19  Yes Birdie Sons, MD  metFORMIN (GLUCOPHAGE) 1000 MG tablet Take 1 tablet by mouth twice daily Patient taking differently: Take 500 mg by mouth 2 (two) times daily with a meal.  08/29/19  Yes Birdie Sons, MD  metoprolol tartrate (LOPRESSOR) 100 MG tablet Take 1 tablet (100 mg total) by mouth at bedtime. Patient taking differently: Take 100 mg by mouth 2 (two) times daily.  10/24/16  Yes Demetrios Loll, MD  traMADol-acetaminophen (ULTRACET) 37.5-325 MG tablet Take 1 tablet by mouth every 6 (six) hours as needed for severe pain. 08/06/19  Yes Wieting, Richard, MD  warfarin (COUMADIN) 5 MG tablet Take 1/2 tablet daily or as directed by physician 10/04/19  Yes Birdie Sons, MD  ketoconazole (NIZORAL) 2 % cream Apply 1 application topically daily as needed for irritation. Patient not taking: Reported on 10/09/2019 08/06/19   Loletha Grayer, MD  nystatin cream (MYCOSTATIN) Apply 1 application topically 2 (two) times daily. Patient not taking: Reported on 10/09/2019 07/17/14   Zara Council A, PA-C  spironolactone (ALDACTONE) 25 MG tablet Take 25 mg by mouth daily. Patient not taking: Reported on 10/09/2019 08/16/19   [provider]    Current  Facility-Administered Medications  Medication Dose Route Frequency Provider Last Rate Last Admin  . 0.9 %  sodium chloride infusion  250 mL Intravenous PRN Elwyn Reach, MD      . acetaminophen (TYLENOL) tablet 650 mg  650 mg Oral Q4H PRN Elwyn Reach, MD   650 mg at 10/11/19 2137  . albuterol (PROVENTIL) (2.5 MG/3ML) 0.083% nebulizer solution 3 mL  3 mL Inhalation Q6H PRN Sharen Hones, MD      . furosemide (LASIX) 250 mg in dextrose 5 % 250 mL (1 mg/mL) infusion  8 mg/hr Intravenous Continuous Sharion Settler, NP 8 mL/hr at 10/12/19 0406 8 mg/hr at 10/12/19 0406  . hydrocerin (EUCERIN) cream   Topical Daily Raiford Noble [place], [place]   Given at 10/11/19 1500  . insulin aspart (novoLOG) injection 0-15 Units  0-15 Units Subcutaneous TID WC Elwyn Reach, MD  2 Units at 10/12/19 0839  . metoprolol tartrate (LOPRESSOR) tablet 100 mg  100 mg Oral BID Sharen Hones, MD   100 mg at 10/11/19 2137  . ondansetron (ZOFRAN) injection 4 mg  4 mg Intravenous Q6H PRN Gala Romney L, MD      . potassium citrate (UROCIT-K) SR tablet 10 mEq  10 mEq Oral TID WC Wouk, Ailene Rud, MD   10 mEq at 10/12/19 0839  . pravastatin (PRAVACHOL) tablet 20 mg  20 mg Oral q1800 Sharen Hones, MD   20 mg at 10/11/19 1721  . sodium chloride flush (NS) 0.9 % injection 3 mL  3 mL Intravenous Q12H Gala Romney L, MD   3 mL at 10/10/19 1315  . sodium chloride flush (NS) 0.9 % injection 3 mL  3 mL Intravenous PRN Elwyn Reach, MD        Allergies as of 10/08/2019 - Review Complete 10/08/2019  Allergen Reaction Noted  . Clindamycin/lincomycin  11/03/2016  . Sulfa antibiotics Hives 05/31/2014  . Sulfasalazine Hives 05/31/2014     Review of Systems  Constitutional: Negative for chills, fever and weight loss.  Eyes: Negative for pain.  Respiratory: Positive for shortness of breath.   Cardiovascular: Negative for chest pain.  Gastrointestinal: Positive for diarrhea and melena. Negative for abdominal pain,  constipation, heartburn, nausea and vomiting.  Genitourinary: Negative for dysuria.  Musculoskeletal: Positive for joint pain.  Skin: Positive for rash.  Neurological: Positive for focal weakness.  Endo/Heme/Allergies: Bruises/bleeds easily.  Psychiatric/Behavioral: Positive for depression.      Physical Exam:  Vital signs in last 24 hours: Temp:  [97.9 F (36.6 C)-98.5 F (36.9 C)] 98.5 F (36.9 C) (09/15 1115) Pulse Rate:  [86-107] 105 (09/15 1115) Resp:  [16-20] 16 (09/15 1115) BP: (118-145)/(65-81) 127/77 (09/15 1115) SpO2:  [95 %-100 %] 99 % (09/15 1115) Weight:  [132.7 kg-133.3 kg] 132.7 kg (09/15 1115) Last BM Date: 10/12/19 General:   In mild distress Head:  Normocephalic and atraumatic. Eyes:   No icterus.    Mouth: Somewhat dry mucous membranes Neck:  Supple; no masses felt Lungs:  No respiratory distress Heart:  Slightly tachy rate Abdomen:   Non-tender, non-distended obese abdomen Rectal:  Melenic appearing stool in rectal tube  Msk:  Scattered excoriations. 2 + pulses Neurologic:  No focal deficits Skin:  Warm, dry, scattered psoriatic appearing rashes Psych:  Intermittently somnolent  LAB RESULTS: Recent Labs    10/10/19 0701 10/11/19 0522 10/12/19 0427  WBC 9.9 8.8 9.0  HGB 9.7* 8.6* 7.9*  HCT 29.0* 26.3* 25.2*  PLT 205 206 199   BMET Recent Labs    10/10/19 0701 10/11/19 0522 10/12/19 0427  NA 137 136 137  K 3.7 3.2* 3.4*  CL 98 97* 97*  CO2 28 28 31   GLUCOSE 145* 142* 146*  BUN 18 19 29*  CREATININE 1.53* 1.44* 1.23  CALCIUM 8.5* 8.1* 7.8*   LFT Recent Labs    10/12/19 0427  PROT 6.3*  ALBUMIN 2.1*  AST 40  ALT 21  ALKPHOS 79  BILITOT 2.0*   PT/INR Recent Labs    10/11/19 0522 10/12/19 1013  LABPROT 21.6* 21.4*  INR 2.0* 1.9*    STUDIES: DG Chest 1 View  Result Date: 10/12/2019 CLINICAL DATA:  Shortness of breath. EXAM: CHEST  1 VIEW COMPARISON:  October 08, 2019 FINDINGS: There is a dual lead AICD. Mild diffusely  increased interstitial lung markings are seen the which is predominant stable in severity when compared  to the prior exam. Mild to moderate severity prominence of the perihilar pulmonary vasculature is noted which represents a new finding. There is no evidence of a pleural effusion or pneumothorax. Stable moderate to marked severity enlargement of the cardiac silhouette is seen. A radiopaque fixation plate and multiple fixation screws are seen within the proximal left humerus. IMPRESSION: 1. Stable cardiomegaly with mild to moderate severity pulmonary vascular congestion. Electronically Signed   By: Virgina Norfolk M.D.   On: 10/12/2019 02:13       Impression / Plan:    # Melenic stool - broad differential given on going for several weeks but given history of cirrhosis, will need evaluation. INR currently 1.9, ideally would be closer to 1.5. Currently hemodynamically stable. - recommend maintaining active type and screen - trend hemoglobins - would start prophylactic antibiotics with ceftriaxone daily - maintain two large bore IV's - transfuse if hemoglobin < 7 - would place on clear liquid diet and make NPO at midnight - make sure electrolyte are replaced on morning labs - PPI IV BID - will tentatively plan on EGD tomorrow as long as INR continues to improve  # NASH Cirrhosis- no history of any varices but shunting noted on previously done CTA. Has normal platelets and liver enzymes except bilirubin is 2.0. MELD-Na score is currently 14 - please check daily  CMP and INR - as above would start prophylactic antibiotics  Please call if any questions or concerns.  Raylene Miyamoto MD, MPH [facility] Gastroenterology

## 2019-10-12 NOTE — Progress Notes (Signed)
Heart Failure Nurse Navigator Note:   HFpEF- 60-65%  Initial visit, spoke only with wife as patient was sound asleep.  Patient had presented for wound check and found to have a 25 pound weight gain. Noting anasarca, increasing SOB and BNP 3,991.  Comorbidities:  Diabetes Hypertension Permament A fib Morbid obesity CKD stage III   Medications:  Diltiazem 120 mg daily Metoprolol tartrate 100 mg BID Pravachol 20 mg daily  Currently on Lasix infusion at 8 mg an hour.  Labs:  Sodium 137, potassium 3.4, BUN 29, creatinine 1.23   Weight:  133.3 kg, yesterday 141.8 kg.  Intake 911 ml Output 1600 ml   Spoke only with the wife today as patient was sleeping, but asked her questions about how he takes care of his heart failure.  She states they do not have a scale so he is unable to weigh daily. He also loves his salt.  She feels that this weight gain has been coming on for the last 3 months,  He has not had much of an appetite for the last 3 months,would take one bite of food and state" that it just did not taste good."  She was given the heart failure teaching booklet and magnet.    Will continue to follow with the patient and his wife during this hospitalization.   Pricilla Riffle RN,CHFN

## 2019-10-13 ENCOUNTER — Encounter: Payer: Self-pay | Admitting: Internal Medicine

## 2019-10-13 ENCOUNTER — Inpatient Hospital Stay: Payer: Medicare HMO | Admitting: Anesthesiology

## 2019-10-13 ENCOUNTER — Encounter
Admission: EM | Disposition: A | Discharge status: Home / Self Care | Payer: Self-pay | Source: Home / Self Care | Attending: Obstetrics and Gynecology

## 2019-10-13 HISTORY — PX: ESOPHAGOGASTRODUODENOSCOPY (EGD) WITH PROPOFOL: SHX5813

## 2019-10-13 LAB — CBC
HCT: 23.3 % — ABNORMAL LOW (ref 39.0–52.0)
Hemoglobin: 7.2 g/dL — ABNORMAL LOW (ref 13.0–17.0)
MCH: 30.3 pg (ref 26.0–34.0)
MCHC: 30.9 g/dL (ref 30.0–36.0)
MCV: 97.9 fL (ref 80.0–100.0)
Platelets: 241 10*3/uL (ref 150–400)
RBC: 2.38 MIL/uL — ABNORMAL LOW (ref 4.22–5.81)
RDW: 21.2 % — ABNORMAL HIGH (ref 11.5–15.5)
WBC: 12.2 10*3/uL — ABNORMAL HIGH (ref 4.0–10.5)
nRBC: 0.3 % — ABNORMAL HIGH (ref 0.0–0.2)

## 2019-10-13 LAB — COMPREHENSIVE METABOLIC PANEL
ALT: 17 U/L (ref 0–44)
AST: 35 U/L (ref 15–41)
Albumin: 2 g/dL — ABNORMAL LOW (ref 3.5–5.0)
Alkaline Phosphatase: 74 U/L (ref 38–126)
Anion gap: 11 (ref 5–15)
BUN: 41 mg/dL — ABNORMAL HIGH (ref 8–23)
CO2: 32 mmol/L (ref 22–32)
Calcium: 7.7 mg/dL — ABNORMAL LOW (ref 8.9–10.3)
Chloride: 94 mmol/L — ABNORMAL LOW (ref 98–111)
Creatinine, Ser: 1.4 mg/dL — ABNORMAL HIGH (ref 0.61–1.24)
GFR calc Af Amer: 59 mL/min — ABNORMAL LOW (ref >60)
GFR calc non Af Amer: 51 mL/min — ABNORMAL LOW (ref >60)
Glucose, Bld: 139 mg/dL — ABNORMAL HIGH (ref 70–99)
Potassium: 3.4 mmol/L — ABNORMAL LOW (ref 3.5–5.1)
Sodium: 137 mmol/L (ref 135–145)
Total Bilirubin: 1.5 mg/dL — ABNORMAL HIGH (ref 0.3–1.2)
Total Protein: 6.2 g/dL — ABNORMAL LOW (ref 6.5–8.1)

## 2019-10-13 LAB — GLUCOSE, CAPILLARY
Glucose-Capillary: 130 mg/dL — ABNORMAL HIGH (ref 70–99)
Glucose-Capillary: 125 mg/dL — ABNORMAL HIGH (ref 70–99)
Glucose-Capillary: 145 mg/dL — ABNORMAL HIGH (ref 70–99)
Glucose-Capillary: 170 mg/dL — ABNORMAL HIGH (ref 70–99)

## 2019-10-13 LAB — PROTIME-INR
INR: 1.8 — ABNORMAL HIGH (ref 0.8–1.2)
Prothrombin Time: 20.5 seconds — ABNORMAL HIGH (ref 11.4–15.2)

## 2019-10-13 LAB — PREPARE RBC (CROSSMATCH)

## 2019-10-13 SURGERY — ESOPHAGOGASTRODUODENOSCOPY (EGD) WITH PROPOFOL
Anesthesia: General

## 2019-10-13 MED ORDER — LIDOCAINE HCL (CARDIAC) PF 100 MG/5ML IV SOSY
PREFILLED_SYRINGE | INTRAVENOUS | Status: DC | PRN
  Administered 2019-10-13: 50 mg via INTRAVENOUS

## 2019-10-13 MED ORDER — SODIUM CHLORIDE (PF) 0.9 % IJ SOLN
PREFILLED_SYRINGE | INTRAMUSCULAR | Status: DC | PRN
  Administered 2019-10-13: 2 mL

## 2019-10-13 MED ORDER — SODIUM CHLORIDE 0.9% IV SOLUTION: Freq: Once | INTRAVENOUS | Status: DC

## 2019-10-13 MED ORDER — PROPOFOL 500 MG/50ML IV EMUL
INTRAVENOUS | Status: AC
  Filled 2019-10-13: qty 50

## 2019-10-13 MED ORDER — EPINEPHRINE 1 MG/10ML IJ SOSY
PREFILLED_SYRINGE | INTRAMUSCULAR | Status: AC
  Filled 2019-10-13: qty 10

## 2019-10-13 MED ORDER — SODIUM CHLORIDE 0.9% IV SOLUTION: Freq: Once | INTRAVENOUS | Status: AC

## 2019-10-13 MED ORDER — SODIUM CHLORIDE 0.9 % IV SOLN: INTRAVENOUS | Status: DC

## 2019-10-13 MED ORDER — PROPOFOL 500 MG/50ML IV EMUL
INTRAVENOUS | Status: DC | PRN
  Administered 2019-10-13: 125 ug/kg/min via INTRAVENOUS

## 2019-10-13 MED ORDER — PHENYLEPHRINE HCL (PRESSORS) 10 MG/ML IV SOLN
INTRAVENOUS | Status: AC
  Filled 2019-10-13: qty 1

## 2019-10-13 MED ORDER — BUTAMBEN-TETRACAINE-BENZOCAINE 2-2-14 % EX AERO
INHALATION_SPRAY | CUTANEOUS | Status: AC
  Filled 2019-10-13: qty 5

## 2019-10-13 MED ORDER — LIDOCAINE HCL (PF) 2 % IJ SOLN
INTRAMUSCULAR | Status: AC
  Filled 2019-10-13: qty 5

## 2019-10-13 MED ORDER — PHENYLEPHRINE HCL (PRESSORS) 10 MG/ML IV SOLN
INTRAVENOUS | Status: DC | PRN
  Administered 2019-10-13 (×3): 50 ug via INTRAVENOUS

## 2019-10-13 NOTE — Transfer of Care (Signed)
Immediate Anesthesia Transfer of Care Note  Patient: Christian Berg  Procedure(s) Performed: ESOPHAGOGASTRODUODENOSCOPY (EGD) WITH PROPOFOL (N/A )  Patient Location: PACU  Anesthesia Type:General  Level of Consciousness: awake and sedated  Airway & Oxygen Therapy: Patient Spontanous Breathing and Patient connected to face mask oxygen  Post-op Assessment: Report given to RN and Post -op Vital signs reviewed and stable  Post vital signs: Reviewed and stable  Last Vitals:  Vitals Value Taken Time  BP    Temp    Pulse    Resp    SpO2      Last Pain:  Vitals:   10/13/19 1232  TempSrc: Temporal  PainSc: 0-No pain      Patients Stated Pain Goal: 0 (81/82/99 3716)  Complications: No complications documented.

## 2019-10-13 NOTE — Anesthesia Procedure Notes (Signed)
Performed by: Vaughan Sine Pre-anesthesia Checklist: Patient identified, Emergency Drugs available, Suction available, Patient being monitored and Timeout performed Oxygen Delivery Method: Supernova nasal CPAP Preoxygenation: Pre-oxygenation with 100% oxygen Induction Type: IV induction Airway Equipment and Method: Bite block Placement Confirmation: positive ETCO2 and CO2 detector

## 2019-10-13 NOTE — Anesthesia Postprocedure Evaluation (Signed)
Anesthesia Post Note  Patient: Christian Berg  Procedure(s) Performed: ESOPHAGOGASTRODUODENOSCOPY (EGD) WITH PROPOFOL (N/A )  Patient location during evaluation: Endoscopy Anesthesia Type: General Level of consciousness: awake and alert Pain management: pain level controlled Vital Signs Assessment: post-procedure vital signs reviewed and stable Respiratory status: spontaneous breathing, nonlabored ventilation, respiratory function stable and patient connected to nasal cannula oxygen Cardiovascular status: blood pressure returned to baseline and stable Postop Assessment: no apparent nausea or vomiting Anesthetic complications: no   No complications documented.   Last Vitals:  Vitals:   10/13/19 1324 10/13/19 1334  BP: (!) 100/56 107/61  Pulse:    Resp:    Temp:    SpO2:      Last Pain:  Vitals:   10/13/19 1324  TempSrc:   PainSc: 2                  Arita Miss

## 2019-10-13 NOTE — Op Note (Signed)
[facility] Gastroenterology Patient Name: Christian Berg Procedure Date: 10/13/2019 12:35 PM MRN: [id number] Account #: [id number] Date of Birth: [birth year] Admit Type: Inpatient Age: 69 Room: [facility] ENDO ROOM 1 Gender: Male Note Status: Finalized Procedure:             Upper GI endoscopy Indications:           Acute post hemorrhagic anemia, Heme positive stool,                         Melena Providers:             Benay Pike. Shavon Ashmore MD, MD Medicines:             Propofol per Anesthesia Complications:         No immediate complications. Estimated blood loss:                         Minimal. Procedure:             Pre-Anesthesia Assessment:                        - The risks and benefits of the procedure and the                         sedation options and risks were discussed with the                         patient. All questions were answered and informed                         consent was obtained.                        - Patient identification and proposed procedure were                         verified prior to the procedure by the nurse. The                         procedure was verified in the procedure room.                        - ASA Grade Assessment: III - A patient with severe                         systemic disease.                        - After reviewing the risks and benefits, the patient                         was deemed in satisfactory condition to undergo the                         procedure.                        After obtaining informed consent, the endoscope was  passed under direct vision. Throughout the procedure,                         the patient's blood pressure, pulse, and oxygen                         saturations were monitored continuously. The Endoscope                         was introduced through the mouth, and advanced to the                         third part of duodenum. The upper GI endoscopy was                          accomplished without difficulty. The patient tolerated                         the procedure well. Findings:      The esophagus was normal.      The stomach was normal.      Actively bleeding visible vessel in second portion of duodenum, Area was       unsuccessfully injected with 2 mL of a 1:10,000 solution of epinephrine       for hemostasis. For hemostasis, two hemostatic clips were successfully       placed (MR conditional). There was no bleeding at the end of the       procedure.      The first portion of the duodenum and third portion of the duodenum were       normal. Impression:            -Actively bleeding visible vessel in second portion of                         duodenum, Area was unsuccessfully injected with 2 mL                         of a 1:10,000 solution of epinephrine for hemostasis.                         For hemostasis, two hemostatic clips were successfully                         placed (MR conditional). There was no bleeding at the                         end of the procedure.                        - Normal esophagus.                        - Normal stomach.                        - Normal first portion of the duodenum and third                         portion of the duodenum.                        -  No specimens collected. Recommendation:        - Return patient to hospital ward for ongoing care.                        - Clear liquid diet.                        - Continue present medications. Procedure Code(s):     --- Professional ---                        [phone], Esophagogastroduodenoscopy, flexible,                         transoral; with control of bleeding, any method Diagnosis Code(s):     --- Professional ---                        K92.1, Melena (includes Hematochezia)                        R19.5, Other fecal abnormalities                        D62, Acute posthemorrhagic anemia CPT copyright 2019 American Medical  Association. All rights reserved. The codes documented in this report are preliminary and upon coder review may  be revised to meet current compliance requirements. Efrain Sella MD, MD 10/13/2019 1:17:09 PM This report has been signed electronically. Number of Addenda: 0 Note Initiated On: 10/13/2019 12:35 PM Estimated Blood Loss:  Estimated blood loss was minimal. Estimated blood loss                         was minimal.      [facility]

## 2019-10-13 NOTE — Interval H&P Note (Signed)
History and Physical Interval Note:  10/13/2019 12:38 PM  Christian Berg  has presented today for surgery, with the diagnosis of Melena.  The various methods of treatment have been discussed with the patient and family. After consideration of risks, benefits and other options for treatment, the patient has consented to  Procedure(s): ESOPHAGOGASTRODUODENOSCOPY (EGD) WITH PROPOFOL (N/A) as a surgical intervention.  The patient's history has been reviewed, patient examined, no change in status, stable for surgery.  I have reviewed the patient's chart and labs.  Questions were answered to the patient's satisfaction.     [place], [place]

## 2019-10-13 NOTE — Progress Notes (Signed)
[name]  PROGRESS NOTE    Christian Berg  JEH:[id number] DOB: Dec 06, 1950 DOA: 10/08/2019 PCP: Birdie Sons, MD   Brief Narrative: HPI per Dr. Gala Romney on 10/09/19 Christian Berg is a 69 y.o. male with medical history significant of diabetes, hypertension, diastolic dysfunction CHF, disseminated superficial actinic porokeratosis, degenerative disc disease, recent bleeding episodes, atrial fibrillation and peripheral vascular disease who presented to the ER with left lower extremity ulcer.  He already has a right lower extremity ulcer.  He came in for wound check.  Patient has noted increasing weight about 25 pounds more in the last few weeks.  He is taking diuretics at home.  He also has noted increasing shortness of breath.  He has bruising easily and was told to stop his Coumadin about a week ago.  Patient was seen in the ER in addition to his leg wounds was found to be having episode of anasarca.  Appears he has acute exacerbation of his diastolic CHF so he is being admitted to the hospital for further evaluation and treatment..  ED Course: Temperature is 99.1 blood pressure 130/47 pulse 140 respirate 26 oxygen sats 94% on room air.  White count 9.0 hemoglobin 9.3 and platelet 235.  Chemistry appears to be within normal except for creatinine 1.38 and calcium 8.4.  Glucose 145.  COVID-19 screen is negative.  Chest x-ray showed evidence of mild fluid overload.  X-ray of the tibia-fibula shows no evidence of osseous abnormality.  Patient being admitted with acute exacerbation of CHF and also possible infected wound.  **Interim History  Is started on IV Lasix and this was changed to Lasix drip.  Wound care was consulted for the left lower extremity and cefepime was started for infection.  He started having significant amount of diarrhea but clinically do not suspect C. difficile unlikely this is antibiotic mediated diarrhea.  He is currently afebrile and has no leukocytosis.  Still remains very  swollen so cardiology was consulted for further evaluation as he remains on Lasix drip. On 9/15 worsening diarrhea, fecal occult blood psoitive, and down trendinghemoglobin. Gi consulted, EGD on 9/16 showed actively bleeding duodenal lesion that was clipped. H to low 7s, transfused one unit.  Assessment & Plan:   Principal Problem:   (HFpEF) heart failure with preserved ejection fraction (HCC) Active Problems:   Diabetes mellitus ([place])   Benign essential HTN   Benign prostatic hyperplasia with urinary obstruction   Chronic atrial fibrillation (HCC)   Morbid (severe) obesity due to excess calories (HCC)   NASH (nonalcoholic steatohepatitis)   Bilateral carotid artery stenosis   CKD stage 3 due to type 2 diabetes mellitus (HCC)   Acute exacerbation of CHF (congestive heart failure) (HCC)   Leg wound, left, initial encounter  Acute on Chronic Diastolic Congestive Heart Failure. -Patient presented with evidence of bichamber congestive heart failure.  Severe volume overload with anasarca. -BNP on admission was 399.1 -Reviewed patient most recent echocardiogram performed in 08/05/2019, ejection fraction was normal. -No significant pulmonary hypertension. -Currently on lasix infusion with fair urine output. 3 L yesterday -Continues to have evidence of volume overload. -Monitor electrolytes and renal function. -Continue to monitor for signs and symptoms of volume overload -Continue with metoprolol tartrate 100 g p.o. twice daily -Cardiology was consulted for further evaluation  Left leg wound with cellulitis in the setting of DSAP -Patient has a bilateral leg edema appears to be secondary to venous stasis and congestive heart failure.   -Posterior left leg also has  ulcer, appears to be infected on admission. Appears to be resolved  -Wound care has been consulted.  have discontinued cefepime given apparent resolution of cellulitis and now diarrhea possibly antibiotic-associated  Bleeding  duodenal lesion Acute blood loss anemia EGD 9/16, actively bleeding lesion clipped.  gi pathogen panel negative Will transfuse 1 U PRBCs Inserted Flexi-Seal given his amount of diarrhea - consulted ID for c diff testing, advised against given afebrile, no leukocytosis - appreciate GI recs. Cont ceftriaxone, continue bid ppi  Chronic Atrial fibrillation with rapid ventricular response. -Patient was taken off of warfarin recently due to concern of skin bleeding.  Patient has some tachycardia, continue beta-blocker. -INR prior to admission was >5  holding on anticoagulation given active bleed   Type 2 Diabetes Mellitus -Continue to Hold Metformin -Continue sliding scale insulin.  -CBG's low 100s   NASH liver cirrhosis. -Continue to monitor. -Check LFTs in the morning   Chronic kidney disease stage IIIa. - stable  Essential Hypertension. - stable   Morbid Obesity -Estimated body mass index is 44.48 kg/m as calculated from the following:   Height as of this encounter: 5' 8"  (1.727 m).   Weight as of this encounter: 132.7 kg. -Weight Loss and Dietary Counseling given   DVT prophylaxis: TED hose Code Status: FULL CODE  Family Communication: No family present at bedside  Disposition Plan: guarded. Appreciate GI and cardiology consults. Transfer to progressive bed  Status is: Inpatient  Remains inpatient appropriate because:Unsafe d/c plan, IV treatments appropriate due to intensity of illness or inability to take PO and Inpatient level of care appropriate due to severity of illness   Dispo: The patient is from: Home              Anticipated d/c is to: TBD              Anticipated d/c date is: 4 days              Patient currently is not medically stable to d/c.  Consultants:   Cardiology  GI   Procedures:   EGD 9/16  Blood transfusion 9/16  Antimicrobials:  Anti-infectives (From admission, onward)   Start     Dose/Rate Route Frequency Ordered Stop    10/12/19 1400  cefTRIAXone (ROCEPHIN) 2 g in sodium chloride 0.9 % 100 mL IVPB        2 g 200 mL/hr over 30 Minutes Intravenous Every 24 hours 10/12/19 1327     10/11/19 2100  ceFEPIme (MAXIPIME) 2 g in sodium chloride 0.9 % 100 mL IVPB  Status:  Discontinued        2 g 200 mL/hr over 30 Minutes Intravenous Every 8 hours 10/10/19 1853 10/10/19 1859   10/10/19 1900  ceFEPIme (MAXIPIME) 2 g in sodium chloride 0.9 % 100 mL IVPB  Status:  Discontinued        2 g 200 mL/hr over 30 Minutes Intravenous Every 8 hours 10/10/19 1859 10/12/19 1215   10/09/19 0000  ceFEPIme (MAXIPIME) 2 g in sodium chloride 0.9 % 100 mL IVPB  Status:  Discontinued        2 g 200 mL/hr over 30 Minutes Intravenous Every 8 hours 10/08/19 2346 10/10/19 1853        Subjective: Feeling weak after EGD. Continued leg pain, less than yesterday. Denies sig SOB. No abd pain or vomiting.   Objective: Vitals:   10/13/19 1344 10/13/19 1354 10/13/19 1404 10/13/19 1534  BP: (!) 103/52 114/87 (!) 108/59 (!) 96/41  Pulse:    (!) 103  Resp:    18  Temp:    98.4 F (36.9 C)  TempSrc:    Oral  SpO2:    100%  Weight:      Height:        Intake/Output Summary (Last 24 hours) at 10/13/2019 1554 Last data filed at 10/13/2019 1302 Gross per 24 hour  Intake 291.34 ml  Output 3000 ml  Net -2708.66 ml   Filed Weights   10/12/19 0856 10/12/19 1115 10/13/19 1232  Weight: 133.3 kg 132.7 kg 132.7 kg   Examination: Physical Exam:  Constitutional: WN/WD morbidly obese Caucasian male currently in no acute distress  Eyes: Lids and conjunctivae normal, sclerae anicteric  ENMT: MMM Neck: Appears normal, supple,  Respiratory: Diminished to auscultation bilaterally with rales at bases,  no wheezing, , rhonchi or crackles. Normal respiratory effort and patient is not tachypenic. No accessory muscle use and has unlabored breathing.   Cardiovascular: Irregularly irregular and slightly tachycardic, no murmurs / rubs / gallops.  1-2+ lower  extremity edema Abdomen: Soft, non-tender, distended secondary body habitus.  Bowel sounds positive.  GU: Deferred. Musculoskeletal: No clubbing / cyanosis of digits/nails. No joint deformity upper and lower extremities.  Skin: Has multiple skin lesions in the lower extremities and he states that this is disseminated superficial actinic porokeratosis. No induration; Warm and dry. Ulcer left calf Neurologic: CN 2-12 grossly intact with no focal deficits. Romberg sign and cerebellar reflexes not assessed.  Psychiatric: Normal judgment and insight. Alert and oriented x 3.  Anxious mood and appropriate affect.   Data Reviewed: I have personally reviewed following labs and imaging studies  CBC: Recent Labs  Lab 10/09/19 0801 10/09/19 0801 10/10/19 0701 10/11/19 0522 10/12/19 0427 10/12/19 1310 10/13/19 0629  WBC 9.8   < > 9.9 8.8 9.0 8.8 12.2*  NEUTROABS 7.8*  --  7.5  --  6.2 5.9  --   HGB 8.7*   < > 9.7* 8.6* 7.9* 7.8* 7.2*  HCT 27.2*   < > 29.0* 26.3* 25.2* 25.0* 23.3*  MCV 95.1   < > 92.9 94.3 96.6 97.7 97.9  PLT 216   < > 205 206 199 224 241   < > = values in this interval not displayed.   Basic Metabolic Panel: Recent Labs  Lab 10/10/19 0701 10/11/19 0522 10/12/19 0427 10/12/19 1310 10/13/19 0629  NA 137 136 137 136 137  K 3.7 3.2* 3.4* 3.5 3.4*  CL 98 97* 97* 95* 94*  CO2 28 28 31 31  32  GLUCOSE 145* 142* 146* 136* 139*  BUN 18 19 29* 34* 41*  CREATININE 1.53* 1.44* 1.23 1.39* 1.40*  CALCIUM 8.5* 8.1* 7.8* 7.8* 7.7*  MG 1.7  --  1.6*  --   --   PHOS  --   --  1.7*  --   --    GFR: Estimated Creatinine Clearance: 65.3 mL/min (A) (by C-G formula based on SCr of 1.4 mg/dL (H)). Liver Function Tests: Recent Labs  Lab 10/08/19 1706 10/12/19 0427 10/12/19 1310 10/13/19 0629  AST 51* 40 40 35  ALT 19 21 19 17   ALKPHOS 121 79 78 74  BILITOT 2.0* 2.0* 1.9* 1.5*  PROT 7.3 6.3* 6.4* 6.2*  ALBUMIN 2.7* 2.1* 2.1* 2.0*   No results for input(s): LIPASE, AMYLASE in  the last 168 hours. No results for input(s): AMMONIA in the last 168 hours. Coagulation Profile: Recent Labs  Lab 10/11/19 0522 10/12/19 1013 10/13/19 7342  INR 2.0* 1.9* 1.8*   Cardiac Enzymes: No results for input(s): CKTOTAL, CKMB, CKMBINDEX, TROPONINI in the last 168 hours. BNP (last 3 results) No results for input(s): PROBNP in the last 8760 hours. HbA1C: No results for input(s): HGBA1C in the last 72 hours. CBG: Recent Labs  Lab 10/12/19 1131 10/12/19 1649 10/12/19 2042 10/13/19 0738 10/13/19 1132  GLUCAP 128* 129* 126* 130* 125*   Lipid Profile: No results for input(s): CHOL, HDL, LDLCALC, TRIG, CHOLHDL, LDLDIRECT in the last 72 hours. Thyroid Function Tests: No results for input(s): TSH, T4TOTAL, FREET4, T3FREE, THYROIDAB in the last 72 hours. Anemia Panel: No results for input(s): VITAMINB12, FOLATE, FERRITIN, TIBC, IRON, RETICCTPCT in the last 72 hours. Sepsis Labs: No results for input(s): PROCALCITON, LATICACIDVEN in the last 168 hours.  Recent Results (from the past 240 hour(s))  SARS Coronavirus 2 by RT PCR (hospital order, performed in [facility] hospital lab) Nasopharyngeal Nasopharyngeal Swab     Status: None   Collection Time: 10/09/19 12:02 AM   Specimen: Nasopharyngeal Swab  Result Value Ref Range Status   SARS Coronavirus 2 NEGATIVE NEGATIVE Final    Comment: (NOTE) SARS-CoV-2 target nucleic acids are NOT DETECTED.  The SARS-CoV-2 RNA is generally detectable in upper and lower respiratory specimens during the acute phase of infection. The lowest concentration of SARS-CoV-2 viral copies this assay can detect is 250 copies / mL. A negative result does not preclude SARS-CoV-2 infection and should not be used as the sole basis for treatment or other patient management decisions.  A negative result may occur with improper specimen collection / handling, submission of specimen other than nasopharyngeal swab, presence of viral mutation(s) within  the areas targeted by this assay, and inadequate number of viral copies (<250 copies / mL). A negative result must be combined with clinical observations, patient history, and epidemiological information.  Fact Sheet for Patients:   StrictlyIdeas.no  Fact Sheet for Healthcare Providers: BankingDealers.co.za  This test is not yet approved or  cleared by the [place] FDA and has been authorized for detection and/or diagnosis of SARS-CoV-2 by FDA under an Emergency Use Authorization (EUA).  This EUA will remain in effect (meaning this test can be used) for the duration of the COVID-19 declaration under Section 564(b)(1) of the Act, 21 U.S.C. section 360bbb-3(b)(1), unless the authorization is terminated or revoked sooner.  Performed at [facility], [address] [place], [place] [address]   Gastrointestinal Panel by PCR , Stool     Status: None   Collection Time: 10/11/19  4:07 PM   Specimen: Stool  Result Value Ref Range Status   Campylobacter species NOT DETECTED NOT DETECTED Final   Plesimonas shigelloides NOT DETECTED NOT DETECTED Final   Salmonella species NOT DETECTED NOT DETECTED Final   Yersinia enterocolitica NOT DETECTED NOT DETECTED Final   Vibrio species NOT DETECTED NOT DETECTED Final   Vibrio cholerae NOT DETECTED NOT DETECTED Final   Enteroaggregative E coli (EAEC) NOT DETECTED NOT DETECTED Final   Enteropathogenic E coli (EPEC) NOT DETECTED NOT DETECTED Final   Enterotoxigenic E coli (ETEC) NOT DETECTED NOT DETECTED Final   Shiga like toxin producing E coli (STEC) NOT DETECTED NOT DETECTED Final   Shigella/Enteroinvasive E coli (EIEC) NOT DETECTED NOT DETECTED Final   Cryptosporidium NOT DETECTED NOT DETECTED Final   Cyclospora cayetanensis NOT DETECTED NOT DETECTED Final   Entamoeba histolytica NOT DETECTED NOT DETECTED Final   Giardia lamblia NOT DETECTED NOT DETECTED Final   Adenovirus F40/41  NOT  DETECTED NOT DETECTED Final   Astrovirus NOT DETECTED NOT DETECTED Final   Norovirus GI/GII NOT DETECTED NOT DETECTED Final   Rotavirus A NOT DETECTED NOT DETECTED Final   Sapovirus (I, II, IV, and V) NOT DETECTED NOT DETECTED Final    Comment: Performed at [facility], [address] [place], [place] [address]    RN Pressure Injury Documentation:     Estimated body mass index is 44.48 kg/m as calculated from the following:   Height as of this encounter: 5' 8"  (1.727 m).   Weight as of this encounter: 132.7 kg.  Malnutrition Type:      Malnutrition Characteristics:      Nutrition Interventions:    Radiology Studies: DG Chest 1 View  Result Date: 10/12/2019 CLINICAL DATA:  Shortness of breath. EXAM: CHEST  1 VIEW COMPARISON:  October 08, 2019 FINDINGS: There is a dual lead AICD. Mild diffusely increased interstitial lung markings are seen the which is predominant stable in severity when compared to the prior exam. Mild to moderate severity prominence of the perihilar pulmonary vasculature is noted which represents a new finding. There is no evidence of a pleural effusion or pneumothorax. Stable moderate to marked severity enlargement of the cardiac silhouette is seen. A radiopaque fixation plate and multiple fixation screws are seen within the proximal left humerus. IMPRESSION: 1. Stable cardiomegaly with mild to moderate severity pulmonary vascular congestion. Electronically Signed   By: Virgina Norfolk M.D.   On: 10/12/2019 02:13     Scheduled Meds: . sodium chloride   Intravenous Once  . sodium chloride   Intravenous Once  . butamben-tetracaine-benzocaine      . EPINEPHrine      . hydrocerin   Topical Daily  . insulin aspart  0-15 Units Subcutaneous TID WC  . metoprolol tartrate  100 mg Oral BID  .  morphine injection  2 mg Intravenous Once  . pantoprazole (PROTONIX) IV  40 mg Intravenous Q12H  . potassium citrate  10 mEq Oral TID WC  . pravastatin  20  mg Oral q1800  . sodium chloride flush  3 mL Intravenous Q12H   Continuous Infusions: . sodium chloride    . cefTRIAXone (ROCEPHIN)  IV 2 g (10/12/19 1533)  . furosemide (LASIX) infusion 8 mg/hr (10/13/19 1538)    LOS: 5 days   Desma Maxim, MD [facility] PAGER is on AMION  If 7PM-7AM, please contact night-coverage [email or web address]

## 2019-10-13 NOTE — Anesthesia Preprocedure Evaluation (Signed)
Anesthesia Evaluation  Patient identified by MRN, date of birth, ID band Patient awake    Reviewed: Allergy & Precautions, H&P , NPO status , Patient's Chart, lab work & pertinent test results  History of Anesthesia Complications Negative for: history of anesthetic complications  Airway Mallampati: II  TM Distance: >3 FB Neck ROM: full    Dental no notable dental hx.    Pulmonary neg pulmonary ROS, asthma , neg sleep apnea, neg COPD, Patient abstained from smoking.Not current smoker,    Pulmonary exam normal breath sounds clear to auscultation       Cardiovascular Exercise Tolerance: Poor METShypertension, +CHF  (-) CAD and (-) Past MI + dysrhythmias Atrial Fibrillation + Cardiac Defibrillator  Rhythm:regular Rate:Normal  TTE 07/2019: IMPRESSIONS    1. Left ventricular ejection fraction, by estimation, is 60 to 65%. The  left ventricle has normal function. The left ventricle has no regional  wall motion abnormalities. Left ventricular diastolic parameters are  indeterminate.  2. Right ventricular systolic function is normal. The right ventricular  size is normal. There is normal pulmonary artery systolic pressure.  3. Left atrial size was mildly dilated.    Neuro/Psych CVA negative neurological ROS  negative psych ROS   GI/Hepatic neg GERD  ,(+)     (-) substance abuse  , Hepatitis -  Endo/Other  diabetesMorbid obesity  Renal/GU      Musculoskeletal   Abdominal   Peds  Hematology   Anesthesia Other Findings Past Medical History: No date: A-fib ([place]) No date: Carotid stenosis No date: CHF (congestive heart failure) (HCC) No date: Degenerative disc disease, lumbar     Comment:  s/p injury No date: Diabetes mellitus without complication (HCC) No date: Disseminated superficial actinic porokeratosis No date: Dysrhythmia     Comment:  A-FIB, palpatations 03/30/2017: Epiglottitis No date: Fatty liver No  date: History of degenerative disc disease No date: History of kidney stones No date: Hypercholesteremia No date: Hypertension No date: Kidney stones No date: Obesity No date: Pacemaker     Comment:  inactive No date: Peripheral vascular disease ([place])     Comment:  Carotid stenosis No date: Presence of permanent cardiac pacemaker     Comment:  Inactive 2011: TIA (transient ischemic attack)     Comment:  No deficits No date: TIA (transient ischemic attack) No date: Vitamin B12 deficiency  Reproductive/Obstetrics                             Anesthesia Physical  Anesthesia Plan  ASA: IV  Anesthesia Plan: General   Post-op Pain Management:    Induction: Intravenous  PONV Risk Score and Plan: 2 and Treatment may vary due to age or medical condition and Propofol infusion  Airway Management Planned: Nasal Cannula  Additional Equipment: None  Intra-op Plan:   Post-operative Plan:   Informed Consent: I have reviewed the patients History and Physical, chart, labs and discussed the procedure including the risks, benefits and alternatives for the proposed anesthesia with the patient or authorized representative who has indicated his/her understanding and acceptance.     Dental advisory given  Plan Discussed with: CRNA  Anesthesia Plan Comments: (Discussed risks of anesthesia with patient, including possibility of difficulty with spontaneous ventilation under anesthesia necessitating airway intervention, PONV, and rare risks such as cardiac or respiratory or neurological events. Patient understands.)        Anesthesia Quick Evaluation

## 2019-10-13 NOTE — Consult Note (Signed)
Consultation/Follow up  Referring Provider:     Dr Si Raider Admit date 10/08/19 Consult date      10/12/19 (Dr Haig Prophet)   Reason for Consultation: anemia/diarrhea/ heme+             HPI:   Christian Berg is a 69 y.o. male previously assessd by Dr Haig Prophet for the above, thought to have melena & with history of NASH cirrhosis/diastolic dysfunction, peripheral vascular disease with chronic lower extremity wounds, and DM originally admitted with CHF exacerbation. On admission INR was >5. Has been off warfarin for about a week, and today his INR is 1.8. Hgb 7 .2. He is receiving bid Pantoprazole and has been receiving cetriaxone. Transfusion is under consideration for hgb <7.  States he feels unwell in general- specifically admits to some intermittent abdominal pain in various areas and random in nature. States his rectal tube is irritating. Reports some continued lower leg pain- this is chronic. His specific concen is that he is thirsty and mouth is dry. He has no further complaints at thist time. Note his LVF  In hjuly echo 60-65% with normal wall motion. He has had no chest pain, there has been some intermittent sob. CXR yesterday with mild to moderate pulmonary vascular congestion. Has been evaluated by cardiology since admission- on lasix gtt and betablocker. He is hemodynamically stable with sa0s 100% on 2lnc. Urine output 3254m/24h.  PREVIOUS ENDOSCOPIES:            None known.   Past Medical History:  Diagnosis Date   A-fib ([facility]    Carotid stenosis    CHF (congestive heart failure) (HCC)    Degenerative disc disease, lumbar    s/p injury   Diabetes mellitus without complication (HCC)    Disseminated superficial actinic porokeratosis    Dysrhythmia    A-FIB, palpatations   Epiglottitis 03/30/2017   Fatty liver    History of degenerative disc disease    History of kidney stones    Hypercholesteremia    Hypertension    Kidney stones    Obesity    Pacemaker     inactive   [place]    Carotid stenosis   Presence of permanent cardiac pacemaker    Inactive   TIA (transient ischemic attack) 2011   No deficits   TIA (transient ischemic attack)    Vitamin B12 deficiency     Past Surgical History:  Procedure Laterality Date   APPENDECTOMY  2004   CARDIAC CATHETERIZATION     CARDIAC PACEMAKER PLACEMENT  2005   CATARACT EXTRACTION W/PHACO Right 06/14/2014   Procedure: CATARACT EXTRACTION PHACO AND INTRAOCULAR LENS PLACEMENT ([place];  Surgeon: CLeandrew Koyanagi MD;  [place]  Service: Ophthalmology;  Laterality: Right;   CATARACT EXTRACTION W/PHACO Left 10/27/2018   Procedure: CATARACT EXTRACTION PHACO AND INTRAOCULAR LENS PLACEMENT (I[facility]) LEFT DIABETIC  00:46.4  18.6%  8.63;  Surgeon: BLeandrew Koyanagi MD;  [place]  Service: Ophthalmology;  Laterality: Left;  Diabetic - oral meds   CYSTOSCOPY W/ RETROGRADES Bilateral 08/09/2014   Procedure: CYSTOSCOPY WITH RETROGRADE PYELOGRAM;  Surgeon: AHollice Espy MD;  Location: [facility];  Service: Urology;  Laterality: Bilateral;   INTUBATION-ENDOTRACHEAL WITH TRACHEOSTOMY STANDBY  03/30/2017   Procedure: INTUBATION-ENDOTRACHEAL WITH TRACHEOSTOMY STANDBY;  Surgeon: VCarloyn Manner MD;  Location: [facility];  Service: ENT;;    Family History  Adopted: Yes  Problem Relation Age of Onset   Heart disease Mother    Fibromyalgia Sister  Hypertension Daughter    Migraines Daughter    Kidney disease Neg Hx    Prostate cancer Neg Hx    Bladder Cancer Neg Hx      Social History   Tobacco Use   Smoking status: Never Smoker   Smokeless tobacco: Never Used  Vaping Use   Vaping Use: Never used  Substance Use Topics   Alcohol use: No    Alcohol/week: 0.0 standard drinks   Drug use: No    Prior to Admission medications   Medication Sig Start Date End Date Taking? Authorizing Provider  albuterol (VENTOLIN HFA)  108 (90 Base) MCG/ACT inhaler Inhale 2 puffs into the lungs every 6 (six) hours as needed for wheezing or shortness of breath. 05/31/19  Yes Birdie Sons, MD  aspirin EC 81 MG tablet Take 81 mg by mouth every evening.    Yes [provider]  cephALEXin (KEFLEX) 500 MG capsule Take 1 capsule (500 mg total) by mouth 4 (four) times daily for 10 days. 10/04/19 10/14/19 Yes Birdie Sons, MD  ferrous sulfate 325 (65 FE) MG tablet Take 325 mg by mouth daily with breakfast.   Yes [provider]  furosemide (LASIX) 20 MG tablet Take 1 tablet by mouth once daily Patient taking differently: Take 60 mg by mouth daily.  06/18/19  Yes Birdie Sons, MD  lisinopril (ZESTRIL) 20 MG tablet Take 1 tablet (20 mg total) by mouth daily. 04/07/19  Yes Birdie Sons, MD  lovastatin (MEVACOR) 20 MG tablet Take 1 tablet (20 mg total) by mouth every evening. 04/08/19  Yes Birdie Sons, MD  metFORMIN (GLUCOPHAGE) 1000 MG tablet Take 1 tablet by mouth twice daily Patient taking differently: Take 500 mg by mouth 2 (two) times daily with a meal.  08/29/19  Yes Birdie Sons, MD  metoprolol tartrate (LOPRESSOR) 100 MG tablet Take 1 tablet (100 mg total) by mouth at bedtime. Patient taking differently: Take 100 mg by mouth 2 (two) times daily.  10/24/16  Yes Demetrios Loll, MD  traMADol-acetaminophen (ULTRACET) 37.5-325 MG tablet Take 1 tablet by mouth every 6 (six) hours as needed for severe pain. 08/06/19  Yes Wieting, Richard, MD  warfarin (COUMADIN) 5 MG tablet Take 1/2 tablet daily or as directed by physician 10/04/19  Yes Birdie Sons, MD  ketoconazole (NIZORAL) 2 % cream Apply 1 application topically daily as needed for irritation. Patient not taking: Reported on 10/09/2019 08/06/19   Loletha Grayer, MD  nystatin cream (MYCOSTATIN) Apply 1 application topically 2 (two) times daily. Patient not taking: Reported on 10/09/2019 07/17/14   Zara Council A, PA-C  spironolactone (ALDACTONE) 25 MG tablet  Take 25 mg by mouth daily. Patient not taking: Reported on 10/09/2019 08/16/19   [provider]    Current Facility-Administered Medications  Medication Dose Route Frequency Provider Last Rate Last Admin   0.9 %  sodium chloride infusion  250 mL Intravenous PRN Elwyn Reach, MD       acetaminophen (TYLENOL) tablet 650 mg  650 mg Oral Q4H PRN Elwyn Reach, MD   650 mg at 10/11/19 2137   albuterol (PROVENTIL) (2.5 MG/3ML) 0.083% nebulizer solution 3 mL  3 mL Inhalation Q6H PRN Sharen Hones, MD       cefTRIAXone (ROCEPHIN) 2 g in sodium chloride 0.9 % 100 mL IVPB  2 g Intravenous Q24H Gwynne Edinger, MD 200 mL/hr at 10/12/19 1533 2 g at 10/12/19 1533   furosemide (LASIX) 250 mg  in dextrose 5 % 250 mL (1 mg/mL) infusion  8 mg/hr Intravenous Continuous Sharion Settler, NP 8 mL/hr at 10/12/19 1812 8 mg/hr at 10/12/19 1812   hydrocerin (EUCERIN) cream   Topical Daily Raiford Noble [place], [place]   1 application at 34/74/25 0951   insulin aspart (novoLOG) injection 0-15 Units  0-15 Units Subcutaneous TID WC Elwyn Reach, MD   2 Units at 10/13/19 0950   metoprolol tartrate (LOPRESSOR) tablet 100 mg  100 mg Oral BID Sharen Hones, MD   100 mg at 10/13/19 0950   morphine 2 MG/ML injection 2 mg  2 mg Intravenous Q4H PRN Gwynne Edinger, MD   2 mg at 10/13/19 0950   morphine 2 MG/ML injection 2 mg  2 mg Intravenous Once Wouk, Ailene Rud, MD       ondansetron [facility]) injection 4 mg  4 mg Intravenous Q6H PRN Elwyn Reach, MD       pantoprazole (PROTONIX) injection 40 mg  40 mg Intravenous Q12H Gwynne Edinger, MD   40 mg at 10/13/19 0950   potassium citrate (UROCIT-K) SR tablet 10 mEq  10 mEq Oral TID WC Gwynne Edinger, MD   10 mEq at 10/12/19 1725   pravastatin (PRAVACHOL) tablet 20 mg  20 mg Oral q1800 Sharen Hones, MD   20 mg at 10/12/19 1725   sodium chloride flush (NS) 0.9 % injection 3 mL  3 mL Intravenous Q12H Gala Romney L, MD   3 mL at 10/13/19  0951   sodium chloride flush (NS) 0.9 % injection 3 mL  3 mL Intravenous PRN Elwyn Reach, MD        Allergies as of 10/08/2019 - Review Complete 10/08/2019  Allergen Reaction Noted   Clindamycin/lincomycin  11/03/2016   Sulfa antibiotics Hives 05/31/2014   Sulfasalazine Hives 05/31/2014     Review of Systems:    All systems reviewed and negative except where noted in HPI, with the exception of chronic rash relating to his psoriasis.    Physical Exam:  Vital signs in last 24 hours: Temp:  [98.3 F (36.8 C)-98.9 F (37.2 C)] 98.3 F (36.8 C) (09/16 0738) Pulse Rate:  [91-108] 108 (09/16 0738) Resp:  [16-20] 18 (09/16 0738) BP: (116-127)/(64-77) 116/64 (09/16 0738) SpO2:  [99 %-100 %] 100 % (09/16 0738) Weight:  [132.7 kg] 132.7 kg (09/15 1115) Last BM Date: 10/12/19 General:   Pleasant elderly man in NAD Head:  Normocephalic and atraumatic. Eyes:   No icterus.   Conjunctiva pale pink. Ears:  Normal auditory acuity. Mouth: Mucosa pale pink moist, no lesions. Neck:  Supple; no masses felt Lung: Respirations even and unlabored. Lungs clear to auscultation bilaterally.   No wheezes, crackles, or rhonchi.  Heart:  S1S2, RRR, no MRG. No edema. Abdomen:   Flat, soft, nondistended, minimal diffuse tenderness. Normal bowel sounds. No appreciable masses or hepatomegaly. No rebound signs or other peritoneal signs. Rectal:  Rectal tube with small amount of melanotic stool  Msk:  MAEW x4, No clubbing or cyanosis. Strength 4/5. Symmetrical without gross deformities. Neurologic:  Alert and  oriented x4;  Cranial nerves II-XII intact.  Skin:  Warm, dry, pale pink . Scattered scaly patches throughout. 2+ lower extremity edema Psych:  Alert and cooperative. Normal affect.  LAB RESULTS: Recent Labs    10/12/19 0427 10/12/19 1310 10/13/19 0629  WBC 9.0 8.8 12.2*  HGB 7.9* 7.8* 7.2*  HCT 25.2* 25.0* 23.3*  PLT 199 224 241   BMET  Recent Labs    10/12/19 0427 10/12/19 1310  10/13/19 0629  NA 137 136 137  K 3.4* 3.5 3.4*  CL 97* 95* 94*  CO2 31 31 32  GLUCOSE 146* 136* 139*  BUN 29* 34* 41*  CREATININE 1.23 1.39* 1.40*  CALCIUM 7.8* 7.8* 7.7*   LFT Recent Labs    10/13/19 0629  PROT 6.2*  ALBUMIN 2.0*  AST 35  ALT 17  ALKPHOS 74  BILITOT 1.5*   PT/INR Recent Labs    10/12/19 1013 10/13/19 0629  LABPROT 21.4* 20.5*  INR 1.9* 1.8*    STUDIES: DG Chest 1 View  Result Date: 10/12/2019 CLINICAL DATA:  Shortness of breath. EXAM: CHEST  1 VIEW COMPARISON:  October 08, 2019 FINDINGS: There is a dual lead AICD. Mild diffusely increased interstitial lung markings are seen the which is predominant stable in severity when compared to the prior exam. Mild to moderate severity prominence of the perihilar pulmonary vasculature is noted which represents a new finding. There is no evidence of a pleural effusion or pneumothorax. Stable moderate to marked severity enlargement of the cardiac silhouette is seen. A radiopaque fixation plate and multiple fixation screws are seen within the proximal left humerus. IMPRESSION: 1. Stable cardiomegaly with mild to moderate severity pulmonary vascular congestion. Electronically Signed   By: Virgina Norfolk M.D.   On: 10/12/2019 02:13       Impression / Plan:   1. Anemia and melena in patient with NASH cirrhosis- continue with planned egd this afternoon to assess source- ddx includes variceal, gave, pud, etc.agree with ppi/octreotied/rationale for transfusion.  His case was discussed with cardiology (Dr Baruch Goldmann) and they state it it ok to proceed.  Reviewed procedure with patient- indications/benefits/risks- including but not  Limited to bleeding, infection, perforation, difficulty with sedation and he is willing to proceed Further recommendations to follow  These services were provided by Stephens November, NP-C, in collaboration with Andrey Farmer, MD with whom I have discussed this patient in full.    Stephens November, NP-C

## 2019-10-13 NOTE — Progress Notes (Signed)
[facility] Cardiology    SUBJECTIVE: The patient reports not feeling well this morning as he feels lack of support from his family. He denies chest pain or palpitations.    Vitals:   10/12/19 1115 10/12/19 1619 10/12/19 1931 10/13/19 0738  BP: 127/77 124/74 116/68 116/64  Pulse: (!) 105 (!) 103 91 (!) 108  Resp: 16 17 20 18   Temp: 98.5 F (36.9 C) 98.9 F (37.2 C) 98.4 F (36.9 C) 98.3 F (36.8 C)  TempSrc: Oral Oral Oral Oral  SpO2: 99% 100% 100% 100%  Weight: 132.7 kg     Height: 5' 8"  (1.727 m)        Intake/Output Summary (Last 24 hours) at 10/13/2019 0852 Last data filed at 10/13/2019 0419 Gross per 24 hour  Intake 291.34 ml  Output 3200 ml  Net -2908.66 ml      PHYSICAL EXAM  General:morbidly obese gentleman lying in bed at a 30 degree angle in no acute distress.  HEENT:Normocephalic and atramatic Neck: No JVD.  Lungs: Normal effort of breathing on supplemental oxygen via [place] Heart:irregularly irregular. without gallops or murmurs.  Msk: Back normal,gait not assessed. Extremities:trace bilateral pedal edema Neuro: Alert and oriented Psych: depression affect, answers questions appropriately   LABS: Basic Metabolic Panel: Recent Labs    10/12/19 0427 10/12/19 0427 10/12/19 1310 10/13/19 0629  NA 137   < > 136 137  K 3.4*   < > 3.5 3.4*  CL 97*   < > 95* 94*  CO2 31   < > 31 32  GLUCOSE 146*   < > 136* 139*  BUN 29*   < > 34* 41*  CREATININE 1.23   < > 1.39* 1.40*  CALCIUM 7.8*   < > 7.8* 7.7*  MG 1.6*  --   --   --   PHOS 1.7*  --   --   --    < > = values in this interval not displayed.   Liver Function Tests: Recent Labs    10/12/19 1310 10/13/19 0629  AST 40 35  ALT 19 17  ALKPHOS 78 74  BILITOT 1.9* 1.5*  PROT 6.4* 6.2*  ALBUMIN 2.1* 2.0*   No results for input(s): LIPASE, AMYLASE in the last 72 hours. CBC: Recent Labs    10/12/19 0427 10/12/19 0427 10/12/19 1310 10/13/19 0629  WBC 9.0   < > 8.8 12.2*  NEUTROABS 6.2  --   5.9  --   HGB 7.9*   < > 7.8* 7.2*  HCT 25.2*   < > 25.0* 23.3*  MCV 96.6   < > 97.7 97.9  PLT 199   < > 224 241   < > = values in this interval not displayed.   Cardiac Enzymes: No results for input(s): CKTOTAL, CKMB, CKMBINDEX, TROPONINI in the last 72 hours. BNP: Invalid input(s): POCBNP D-Dimer: No results for input(s): DDIMER in the last 72 hours. Hemoglobin A1C: No results for input(s): HGBA1C in the last 72 hours. Fasting Lipid Panel: No results for input(s): CHOL, HDL, LDLCALC, TRIG, CHOLHDL, LDLDIRECT in the last 72 hours. Thyroid Function Tests: No results for input(s): TSH, T4TOTAL, T3FREE, THYROIDAB in the last 72 hours.  Invalid input(s): FREET3 Anemia Panel: No results for input(s): VITAMINB12, FOLATE, FERRITIN, TIBC, IRON, RETICCTPCT in the last 72 hours.  DG Chest 1 View  Result Date: 10/12/2019 CLINICAL DATA:  Shortness of breath. EXAM: CHEST  1 VIEW COMPARISON:  October 08, 2019 FINDINGS: There is a dual lead AICD.  Mild diffusely increased interstitial lung markings are seen the which is predominant stable in severity when compared to the prior exam. Mild to moderate severity prominence of the perihilar pulmonary vasculature is noted which represents a new finding. There is no evidence of a pleural effusion or pneumothorax. Stable moderate to marked severity enlargement of the cardiac silhouette is seen. A radiopaque fixation plate and multiple fixation screws are seen within the proximal left humerus. IMPRESSION: 1. Stable cardiomegaly with mild to moderate severity pulmonary vascular congestion. Electronically Signed   By: Virgina Norfolk M.D.   On: 10/12/2019 02:13     Echo LVEF 60-65%, no wall motion abnormalities  TELEMETRY: atrial fibrillation, rate 110s-120s  ASSESSMENT AND PLAN:  Principal Problem:   (HFpEF) heart failure with preserved ejection fraction (HCC) Active Problems:   Diabetes mellitus ([place])   Benign essential HTN   Benign prostatic  hyperplasia with urinary obstruction   Chronic atrial fibrillation (HCC)   Morbid (severe) obesity due to excess calories (HCC)   NASH (nonalcoholic steatohepatitis)   Bilateral carotid artery stenosis   CKD stage 3 due to type 2 diabetes mellitus (HCC)   Acute exacerbation of CHF (congestive heart failure) (HCC)   Leg wound, left, initial encounter    1.Acute on chronic diastolic CHF,presenting with weight gain, progressive exertional dyspnea, peripheral edema, and orthopnea, with anasarca, currently on Lasix drip. Weight 133.3 kg today, down from 141.8 kg on 9/14. 2.Chronic atrial fibrillation, on metoprolol tartrate 100 mg twice daily for rate control, warfarin on hold due to bleeding, and now hemoccult possitve. Elevated ventricular rate to 110s-120s bpm, RVR likely compensatory in light of GI bleed, patient asymptomatic. 3.Anemia,hemoglobin down to 7.2 today (7.8 yesterday) GI following. Melenic stools. 5. NASH, GI following. If considering colonoscopy, the patient has been as medically optimized as possible. Recommend continuing metoprolol perioperatively for rate control.  Recommendations: 1. Continue to hold warfarin in light of GI bleed 2. Continue metoprolol tartrate for rate control 3. If considering colonoscopy, the patient has been as medically optimized as possible. Recommend continuing metoprolol perioperatively for rate control. 4. Continue Lasix drip for now; please weight patient daily; continue to monitor renal function and I&Os   Clabe Seal, PA-C 10/13/2019 8:52 AM

## 2019-10-13 NOTE — Progress Notes (Signed)
PT attempt. Pt has been off floor for upper endoscopy. Will return next date and continue to follow pt per current POC.

## 2019-10-14 LAB — COMPREHENSIVE METABOLIC PANEL
ALT: 16 U/L (ref 0–44)
AST: 40 U/L (ref 15–41)
Albumin: 2.1 g/dL — ABNORMAL LOW (ref 3.5–5.0)
Alkaline Phosphatase: 78 U/L (ref 38–126)
Anion gap: 9 (ref 5–15)
BUN: 42 mg/dL — ABNORMAL HIGH (ref 8–23)
CO2: 33 mmol/L — ABNORMAL HIGH (ref 22–32)
Calcium: 7.7 mg/dL — ABNORMAL LOW (ref 8.9–10.3)
Chloride: 94 mmol/L — ABNORMAL LOW (ref 98–111)
Creatinine, Ser: 1.57 mg/dL — ABNORMAL HIGH (ref 0.61–1.24)
GFR calc Af Amer: 51 mL/min — ABNORMAL LOW (ref >60)
GFR calc non Af Amer: 44 mL/min — ABNORMAL LOW (ref >60)
Glucose, Bld: 151 mg/dL — ABNORMAL HIGH (ref 70–99)
Potassium: 3.5 mmol/L (ref 3.5–5.1)
Sodium: 136 mmol/L (ref 135–145)
Total Bilirubin: 1.4 mg/dL — ABNORMAL HIGH (ref 0.3–1.2)
Total Protein: 6.3 g/dL — ABNORMAL LOW (ref 6.5–8.1)

## 2019-10-14 LAB — CBC
HCT: 23.4 % — ABNORMAL LOW (ref 39.0–52.0)
Hemoglobin: 7.5 g/dL — ABNORMAL LOW (ref 13.0–17.0)
MCH: 30.6 pg (ref 26.0–34.0)
MCHC: 32.1 g/dL (ref 30.0–36.0)
MCV: 95.5 fL (ref 80.0–100.0)
Platelets: 221 10*3/uL (ref 150–400)
RBC: 2.45 MIL/uL — ABNORMAL LOW (ref 4.22–5.81)
RDW: 23.6 % — ABNORMAL HIGH (ref 11.5–15.5)
WBC: 13.4 10*3/uL — ABNORMAL HIGH (ref 4.0–10.5)
nRBC: 0.1 % (ref 0.0–0.2)

## 2019-10-14 LAB — MAGNESIUM: Magnesium: 2.1 mg/dL (ref 1.7–2.4)

## 2019-10-14 LAB — PROTIME-INR
INR: 1.7 — ABNORMAL HIGH (ref 0.8–1.2)
Prothrombin Time: 19.7 seconds — ABNORMAL HIGH (ref 11.4–15.2)

## 2019-10-14 LAB — GLUCOSE, CAPILLARY
Glucose-Capillary: 145 mg/dL — ABNORMAL HIGH (ref 70–99)
Glucose-Capillary: 179 mg/dL — ABNORMAL HIGH (ref 70–99)
Glucose-Capillary: 204 mg/dL — ABNORMAL HIGH (ref 70–99)
Glucose-Capillary: 179 mg/dL — ABNORMAL HIGH (ref 70–99)

## 2019-10-14 MED ORDER — ACETAMINOPHEN 325 MG PO TABS
650.0000 mg | ORAL_TABLET | ORAL | Status: DC | PRN
  Administered 2019-10-16 – 2019-10-18 (×9): 650 mg via ORAL
  Filled 2019-10-14 (×9): qty 2

## 2019-10-14 NOTE — Progress Notes (Signed)
   Heart  Failure Nurse Navigator Note  HFpEF 60-65%  Spoke with wife.  Patient presented for wound check and found to have 25 pound weight gain.  Noting anasarca and increasing SOB,BNP 3,991.  Comorbidities:  Diabetes Hypertension Permament A fib Morbid Obesity CKD stage III  Medications:  Diltiazem 120 mg daily Metoprolol tartrate 100 mg BID Potassium 10 meq TID  Remains on Lasix infusion at 8 mg an hour  Labs:  Sodium 136, potassium 3.5, BUN 42(yesterday 41), creatinine 1.57, magnesium 2.1.  Intake 2255 ml Output 1,105 ml  Weight !29.5 kg (on admission 141.2)  Wife states she read the heart failure teaching booklet and understands what needs to be done but what  needs to be done and what her husband will do are two different things.Discussed removing all salt from the house and using Mrs.Dash.  She again states her husband is going to do what he wants, she has tried for years to get him to change and eat healthy.  Pricilla Riffle RN, CHFN

## 2019-10-14 NOTE — Progress Notes (Signed)
. GI Inpatient Follow-up Note  Subjective:  Patient more alert and oriented. Denies any black stool but he is unsure.  Scheduled Inpatient Medications:   sodium chloride   Intravenous Once   hydrocerin   Topical Daily   insulin aspart  0-15 Units Subcutaneous TID WC   metoprolol tartrate  100 mg Oral BID   pantoprazole (PROTONIX) IV  40 mg Intravenous Q12H   potassium citrate  10 mEq Oral TID WC   pravastatin  20 mg Oral q1800   sodium chloride flush  3 mL Intravenous Q12H    Continuous Inpatient Infusions:    sodium chloride     cefTRIAXone (ROCEPHIN)  IV 2 g (10/12/19 1533)   furosemide (LASIX) infusion 8 mg/hr (10/13/19 1538)    PRN Inpatient Medications:  sodium chloride, acetaminophen, albuterol, morphine injection, ondansetron (ZOFRAN) IV, sodium chloride flush  Review of Systems: Constitutional: More alert Eyes: No changes in vision. ENT: No oral lesions, sore throat.  GI: see HPI.  Heme/Lymph: No easy bruising.  CV: No chest pain.  GU: No hematuria.  Integumentary: Lower extremity rashes Neuro: No headaches.  Psych: No depression/anxiety.  Endocrine: No heat/cold intolerance.  Allergic/Immunologic: No urticaria.  Resp: No cough, SOB.  Musculoskeletal: No joint swelling.    Physical Examination: BP (!) 120/56 (BP Location: Left Arm)    Pulse 95    Temp 98.3 F (36.8 C) (Oral)    Resp 17    Ht 5' 8"  (1.727 m)    Wt 129.5 kg    SpO2 97%    BMI 43.41 kg/m  Gen: NAD, alert and oriented x 4 HEENT: PEERLA, EOMI, Neck: supple, no JVD Chest: No respiratory distress CV: regular rate Abd: soft, non-tender, obese abdomen Ext: no edema, well perfused with 2+ pulses, Skin: excoriations on lower extremities Lymph: no LAD  Data: Lab Results  Component Value Date   WBC 13.4 (H) 10/14/2019   HGB 7.5 (L) 10/14/2019   HCT 23.4 (L) 10/14/2019   MCV 95.5 10/14/2019   PLT 221 10/14/2019   Recent Labs  Lab 10/12/19 1310 10/13/19 0629 10/14/19 0657   HGB 7.8* 7.2* 7.5*   Lab Results  Component Value Date   NA 136 10/14/2019   K 3.5 10/14/2019   CL 94 (L) 10/14/2019   CO2 33 (H) 10/14/2019   BUN 42 (H) 10/14/2019   CREATININE 1.57 (H) 10/14/2019   Lab Results  Component Value Date   ALT 16 10/14/2019   AST 40 10/14/2019   ALKPHOS 78 10/14/2019   BILITOT 1.4 (H) 10/14/2019   Recent Labs  Lab 10/14/19 0657  INR 1.7*   Assessment/Plan: Christian Berg is a 69 y.o. 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  Recommendations:  # PUD - continue IV PPI BID for total of 72 hours and then would switch to PO daily if no further episodes of bleeding - advance diet as tolerated - continue to monitor hemoglobins - continue to hold coumadin. In terms of re-starting coumadin, would need to weigh risks vs benefits of further anticoagulation but if planning on restarting, would not restart until 72 hours after clipping (yesterday) provided no further bleeding episodes.  # NASH with advanced fibrosis/cirrhosis- no varices on EGD - continue ceftriaxone for total of 5 days, can switch to cipro if discharged before then - daily CMP and INR  Please call with questions or concerns. Will likely follow  peripherally over the weekend.  Raylene Miyamoto MD, MPH [facility]

## 2019-10-14 NOTE — Progress Notes (Signed)
[name]  PROGRESS NOTE    Christian Berg  OIT:[id number] DOB: Sep 05, 1950 DOA: 10/08/2019 PCP: Birdie Sons, MD   Brief Narrative: HPI per Dr. Gala Romney on 10/09/19 Christian Berg is a 69 y.o. male with medical history significant of diabetes, hypertension, diastolic dysfunction CHF, disseminated superficial actinic porokeratosis, degenerative disc disease, recent bleeding episodes, atrial fibrillation and peripheral vascular disease who presented to the ER with left lower extremity ulcer.  He already has a right lower extremity ulcer.  He came in for wound check.  Patient has noted increasing weight about 25 pounds more in the last few weeks.  He is taking diuretics at home.  He also has noted increasing shortness of breath.  He has bruising easily and was told to stop his Coumadin about a week ago.  Patient was seen in the ER in addition to his leg wounds was found to be having episode of anasarca.  Appears he has acute exacerbation of his diastolic CHF so he is being admitted to the hospital for further evaluation and treatment..  ED Course: Temperature is 99.1 blood pressure 130/47 pulse 140 respirate 26 oxygen sats 94% on room air.  White count 9.0 hemoglobin 9.3 and platelet 235.  Chemistry appears to be within normal except for creatinine 1.38 and calcium 8.4.  Glucose 145.  COVID-19 screen is negative.  Chest x-ray showed evidence of mild fluid overload.  X-ray of the tibia-fibula shows no evidence of osseous abnormality.  Patient being admitted with acute exacerbation of CHF and also possible infected wound.  **Interim History  Is started on IV Lasix and this was changed to Lasix drip.  Wound care was consulted for the left lower extremity and cefepime was started for infection.  He started having significant amount of diarrhea but clinically do not suspect C. difficile unlikely this is antibiotic mediated diarrhea.  He is currently afebrile and has no leukocytosis.  Still remains very  swollen so cardiology was consulted for further evaluation as he remains on Lasix drip. On 9/15 worsening diarrhea, fecal occult blood psoitive, and down trendinghemoglobin. Gi consulted, EGD on 9/16 showed actively bleeding duodenal lesion that was clipped. H to low 7s, transfused one unit. On 9/17 H stable in 7s, no emesis  Assessment & Plan:   Principal Problem:   (HFpEF) heart failure with preserved ejection fraction (HCC) Active Problems:   Diabetes mellitus ([place])   Benign essential HTN   Benign prostatic hyperplasia with urinary obstruction   Chronic atrial fibrillation (HCC)   Morbid (severe) obesity due to excess calories (HCC)   NASH (nonalcoholic steatohepatitis)   Bilateral carotid artery stenosis   CKD stage 3 due to type 2 diabetes mellitus ([place])   Acute exacerbation of CHF (congestive heart failure) (HCC)   Leg wound, left, initial encounter  Acute on Chronic Diastolic Congestive Heart Failure. -Patient presented with evidence of bichamber congestive heart failure.  Severe volume overload with anasarca. -BNP on admission was 399.1 -Reviewed patient most recent echocardiogram performed in 08/05/2019, ejection fraction was normal. -No significant pulmonary hypertension. -Currently on lasix infusion with fair urine output. Net positive 9/16 with blood transfusion -Continues to have evidence of volume overload. -Monitor electrolytes and renal function, cr with slight up-trend today, will monitor closely tomorrow -Continue to monitor for signs and symptoms of volume overload -Continue with metoprolol tartrate 100 g p.o. twice daily - continue [place] O2 currently on 2 liters, wean as able -Cardiology was consulted for further evaluation  Left leg wound  with cellulitis in the setting of DSAP -Patient has a bilateral leg edema appears to be secondary to venous stasis and congestive heart failure.   -Posterior left leg also has ulcer, appears to be infected on admission. Appears to be  resolved  -Wound care has been consulted.  have discontinued cefepime given apparent resolution of cellulitis and now diarrhea possibly antibiotic-associated  Bleeding duodenal lesion Acute blood loss anemia EGD 9/16, actively bleeding lesion clipped.  gi pathogen panel negative 1 U transfused 9/16, pt tolerated well Inserted Flexi-Seal given his amount of diarrhea - consulted ID for c diff testing, advised against given afebrile, no leukocytosis - appreciate GI recs. Cont ceftriaxone, continue bid ppi  Chronic Atrial fibrillation with rapid ventricular response. -Patient was taken off of warfarin recently due to concern of skin bleeding.  Patient has some tachycardia, continue beta-blocker. -INR prior to admission was >5  holding on anticoagulation given active bleed   Type 2 Diabetes Mellitus -Continue to Hold Metformin -Continue sliding scale insulin.  -CBG's low 100s   NASH liver cirrhosis. -Continue to monitor. -Check LFTs in the morning   Chronic kidney disease stage IIIa. - mild cr bump today, monitor  Essential Hypertension. - stable   Morbid Obesity -Estimated body mass index is 43.41 kg/m as calculated from the following:   Height as of this encounter: 5' 8"  (1.727 m).   Weight as of this encounter: 129.5 kg. -Weight Loss and Dietary Counseling given   DVT prophylaxis: TED hose Code Status: FULL CODE  Family Communication: No family present at bedside  Disposition Plan: guarded. Appreciate GI and cardiology consults. Transfer to progressive bed  Status is: Inpatient  Remains inpatient appropriate because:Unsafe d/c plan, IV treatments appropriate due to intensity of illness or inability to take PO and Inpatient level of care appropriate due to severity of illness   Dispo: The patient is from: Home              Anticipated d/c is to: TBD              Anticipated d/c date is: 4 days              Patient currently is not medically stable to  d/c.  Consultants:   Cardiology  GI   Procedures:   EGD 9/16  Blood transfusion 9/16  Lines/Tubes - rectal tube  Antimicrobials:  Anti-infectives (From admission, onward)   Start     Dose/Rate Route Frequency Ordered Stop   10/12/19 1400  cefTRIAXone (ROCEPHIN) 2 g in sodium chloride 0.9 % 100 mL IVPB        2 g 200 mL/hr over 30 Minutes Intravenous Every 24 hours 10/12/19 1327     10/11/19 2100  ceFEPIme (MAXIPIME) 2 g in sodium chloride 0.9 % 100 mL IVPB  Status:  Discontinued        2 g 200 mL/hr over 30 Minutes Intravenous Every 8 hours 10/10/19 1853 10/10/19 1859   10/10/19 1900  ceFEPIme (MAXIPIME) 2 g in sodium chloride 0.9 % 100 mL IVPB  Status:  Discontinued        2 g 200 mL/hr over 30 Minutes Intravenous Every 8 hours 10/10/19 1859 10/12/19 1215   10/09/19 0000  ceFEPIme (MAXIPIME) 2 g in sodium chloride 0.9 % 100 mL IVPB  Status:  Discontinued        2 g 200 mL/hr over 30 Minutes Intravenous Every 8 hours 10/08/19 2346 10/10/19 1853  Subjective: Slightly more energy today. Chronic leg and abd pain.  Denies sig SOB. No vomiting  Objective: Vitals:   10/14/19 0424 10/14/19 0742 10/14/19 1014 10/14/19 1132  BP: 110/67 101/66 122/66 (!) 120/56  Pulse: 98 87  95  Resp: 20 17  17   Temp: 99 F (37.2 C) 98.3 F (36.8 C)  98.3 F (36.8 C)  TempSrc: Oral Oral  Oral  SpO2: 98% 100%  97%  Weight: 129.5 kg     Height:        Intake/Output Summary (Last 24 hours) at 10/14/2019 1144 Last data filed at 10/14/2019 1000 Gross per 24 hour  Intake 2495.15 ml  Output 350 ml  Net 2145.15 ml   Filed Weights   10/12/19 1115 10/13/19 1232 10/14/19 0424  Weight: 132.7 kg 132.7 kg 129.5 kg   Examination: Physical Exam:  Constitutional: WN/WD morbidly obese Caucasian male currently in no acute distress  Eyes: Lids and conjunctivae normal, sclerae anicteric  ENMT: MMM Neck: Appears normal, supple,  Respiratory: Diminished to auscultation bilaterally with  rales at bases,  no wheezing, , rhonchi or crackles. Normal respiratory effort and patient is not tachypenic. No accessory muscle use and has unlabored breathing.   Cardiovascular: Irregularly irregular and slightly tachycardic, no murmurs / rubs / gallops.  1-2+ lower extremity edema Abdomen: Soft, non-tender, distended secondary body habitus.  Bowel sounds positive.  GU: Deferred. Musculoskeletal: No clubbing / cyanosis of digits/nails. No joint deformity upper and lower extremities.  Skin: Has multiple skin lesions in the lower extremities and he states that this is disseminated superficial actinic porokeratosis. No induration; Warm and dry. Ulcer left calf. Pale. Neurologic: CN 2-12 grossly intact with no focal deficits. Romberg sign and cerebellar reflexes not assessed.  Psychiatric: Normal judgment and insight. Alert and oriented x 3.  Anxious mood and appropriate affect.   Data Reviewed: I have personally reviewed following labs and imaging studies  CBC: Recent Labs  Lab 10/09/19 0801 10/09/19 0801 10/10/19 0701 10/10/19 0701 10/11/19 0522 10/12/19 0427 10/12/19 1310 10/13/19 0629 10/14/19 0657  WBC 9.8   < > 9.9   < > 8.8 9.0 8.8 12.2* 13.4*  NEUTROABS 7.8*  --  7.5  --   --  6.2 5.9  --   --   HGB 8.7*   < > 9.7*   < > 8.6* 7.9* 7.8* 7.2* 7.5*  HCT 27.2*   < > 29.0*   < > 26.3* 25.2* 25.0* 23.3* 23.4*  MCV 95.1   < > 92.9   < > 94.3 96.6 97.7 97.9 95.5  PLT 216   < > 205   < > 206 199 224 241 221   < > = values in this interval not displayed.   Basic Metabolic Panel: Recent Labs  Lab 10/10/19 0701 10/10/19 0701 10/11/19 0522 10/12/19 0427 10/12/19 1310 10/13/19 0629 10/14/19 0657  NA 137   < > 136 137 136 137 136  K 3.7   < > 3.2* 3.4* 3.5 3.4* 3.5  CL 98   < > 97* 97* 95* 94* 94*  CO2 28   < > 28 31 31  32 33*  GLUCOSE 145*   < > 142* 146* 136* 139* 151*  BUN 18   < > 19 29* 34* 41* 42*  CREATININE 1.53*   < > 1.44* 1.23 1.39* 1.40* 1.57*  CALCIUM 8.5*   < >  8.1* 7.8* 7.8* 7.7* 7.7*  MG 1.7  --   --  1.6*  --   --  2.1  PHOS  --   --   --  1.7*  --   --   --    < > = values in this interval not displayed.   GFR: Estimated Creatinine Clearance: 57.5 mL/min (A) (by C-G formula based on SCr of 1.57 mg/dL (H)). Liver Function Tests: Recent Labs  Lab 10/08/19 1706 10/12/19 0427 10/12/19 1310 10/13/19 0629 10/14/19 0657  AST 51* 40 40 35 40  ALT 19 21 19 17 16   ALKPHOS 121 79 78 74 78  BILITOT 2.0* 2.0* 1.9* 1.5* 1.4*  PROT 7.3 6.3* 6.4* 6.2* 6.3*  ALBUMIN 2.7* 2.1* 2.1* 2.0* 2.1*   No results for input(s): LIPASE, AMYLASE in the last 168 hours. No results for input(s): AMMONIA in the last 168 hours. Coagulation Profile: Recent Labs  Lab 10/11/19 0522 10/12/19 1013 10/13/19 0629 10/14/19 0657  INR 2.0* 1.9* 1.8* 1.7*   Cardiac Enzymes: No results for input(s): CKTOTAL, CKMB, CKMBINDEX, TROPONINI in the last 168 hours. BNP (last 3 results) No results for input(s): PROBNP in the last 8760 hours. HbA1C: No results for input(s): HGBA1C in the last 72 hours. CBG: Recent Labs  Lab 10/13/19 1132 10/13/19 1621 10/13/19 2122 10/14/19 0743 10/14/19 1132  GLUCAP 125* 145* 170* 145* 179*   Lipid Profile: No results for input(s): CHOL, HDL, LDLCALC, TRIG, CHOLHDL, LDLDIRECT in the last 72 hours. Thyroid Function Tests: No results for input(s): TSH, T4TOTAL, FREET4, T3FREE, THYROIDAB in the last 72 hours. Anemia Panel: No results for input(s): VITAMINB12, FOLATE, FERRITIN, TIBC, IRON, RETICCTPCT in the last 72 hours. Sepsis Labs: No results for input(s): PROCALCITON, LATICACIDVEN in the last 168 hours.  Recent Results (from the past 240 hour(s))  SARS Coronavirus 2 by RT PCR (hospital order, performed in [facility] hospital lab) Nasopharyngeal Nasopharyngeal Swab     Status: None   Collection Time: 10/09/19 12:02 AM   Specimen: Nasopharyngeal Swab  Result Value Ref Range Status   SARS Coronavirus 2 NEGATIVE NEGATIVE Final     Comment: (NOTE) SARS-CoV-2 target nucleic acids are NOT DETECTED.  The SARS-CoV-2 RNA is generally detectable in upper and lower respiratory specimens during the acute phase of infection. The lowest concentration of SARS-CoV-2 viral copies this assay can detect is 250 copies / mL. A negative result does not preclude SARS-CoV-2 infection and should not be used as the sole basis for treatment or other patient management decisions.  A negative result may occur with improper specimen collection / handling, submission of specimen other than nasopharyngeal swab, presence of viral mutation(s) within the areas targeted by this assay, and inadequate number of viral copies (<250 copies / mL). A negative result must be combined with clinical observations, patient history, and epidemiological information.  Fact Sheet for Patients:   StrictlyIdeas.no  Fact Sheet for Healthcare Providers: BankingDealers.co.za  This test is not yet approved or  cleared by the [place] FDA and has been authorized for detection and/or diagnosis of SARS-CoV-2 by FDA under an Emergency Use Authorization (EUA).  This EUA will remain in effect (meaning this test can be used) for the duration of the COVID-19 declaration under Section 564(b)(1) of the Act, 21 U.S.C. section 360bbb-3(b)(1), unless the authorization is terminated or revoked sooner.  Performed at [facility], [address] [place], [place] [address]   Gastrointestinal Panel by PCR , Stool     Status: None   Collection Time: 10/11/19  4:07 PM   Specimen: Stool  Result Value Ref Range Status  Campylobacter species NOT DETECTED NOT DETECTED Final   Plesimonas shigelloides NOT DETECTED NOT DETECTED Final   Salmonella species NOT DETECTED NOT DETECTED Final   Yersinia enterocolitica NOT DETECTED NOT DETECTED Final   Vibrio species NOT DETECTED NOT DETECTED Final   Vibrio cholerae NOT DETECTED  NOT DETECTED Final   Enteroaggregative E coli (EAEC) NOT DETECTED NOT DETECTED Final   Enteropathogenic E coli (EPEC) NOT DETECTED NOT DETECTED Final   Enterotoxigenic E coli (ETEC) NOT DETECTED NOT DETECTED Final   Shiga like toxin producing E coli (STEC) NOT DETECTED NOT DETECTED Final   Shigella/Enteroinvasive E coli (EIEC) NOT DETECTED NOT DETECTED Final   Cryptosporidium NOT DETECTED NOT DETECTED Final   Cyclospora cayetanensis NOT DETECTED NOT DETECTED Final   Entamoeba histolytica NOT DETECTED NOT DETECTED Final   Giardia lamblia NOT DETECTED NOT DETECTED Final   Adenovirus F40/41 NOT DETECTED NOT DETECTED Final   Astrovirus NOT DETECTED NOT DETECTED Final   Norovirus GI/GII NOT DETECTED NOT DETECTED Final   Rotavirus A NOT DETECTED NOT DETECTED Final   Sapovirus (I, II, IV, and V) NOT DETECTED NOT DETECTED Final    Comment: Performed at [facility], [address], [place], [place] [address]    RN Pressure Injury Documentation:     Estimated body mass index is 43.41 kg/m as calculated from the following:   Height as of this encounter: 5' 8"  (1.727 m).   Weight as of this encounter: 129.5 kg.  Malnutrition Type:      Malnutrition Characteristics:      Nutrition Interventions:    Radiology Studies: No results found.   Scheduled Meds: . sodium chloride   Intravenous Once  . hydrocerin   Topical Daily  . insulin aspart  0-15 Units Subcutaneous TID WC  . metoprolol tartrate  100 mg Oral BID  . pantoprazole (PROTONIX) IV  40 mg Intravenous Q12H  . potassium citrate  10 mEq Oral TID WC  . pravastatin  20 mg Oral q1800  . sodium chloride flush  3 mL Intravenous Q12H   Continuous Infusions: . sodium chloride    . cefTRIAXone (ROCEPHIN)  IV 2 g (10/12/19 1533)  . furosemide (LASIX) infusion 8 mg/hr (10/13/19 1538)    LOS: 6 days   Desma Maxim, MD [facility] PAGER is on AMION  If 7PM-7AM, please contact night-coverage [email or web address]

## 2019-10-14 NOTE — Evaluation (Signed)
Physical Therapy Re-Evaluation Patient Details Name: Christian Berg MRN: [id number] DOB: April 17, 1950 Today's Date: 10/14/2019   History of Present Illness  Pt is a 69 y.o. male presenting to hospital 9/11 with worsening orthopnea, exertional SOB, worsening LE edema, and developing wounds B LE's.  Pt admitted with acute exacerbation of diastolic CHF and leg wounds; also c/o L LE pain.  PMH inlcudes a-fib, CHF, DM, carotid stenosis, htn, pacemaker, PVD, and TIA.  Clinical Impression  Pt transferred to progressive care unit 9/15 (d/t worsening diarrhea, fecal occult blood positive, and down-trending Hgb).  S/p EGD 9/16.  New PT order received 9/17 so PT re-evaluation performed.  Prior to hospital admission, pt was ambulatory (uses cane most of the time but will use RW if doing a lot of walking); lives with his wife in 1 level home with 3 STE with L railing.  Currently pt is min to mod assist x2 with bed mobility and able to sit on edge of bed for about 10 minutes with SBA.  Deferred further mobility d/t generalized weakness and pt reporting feeling tired and out of it from morphine medication (to help his pain).  Extra time spent repositioning pt for comfort end of session.  Pt would benefit from skilled PT to address noted impairments and functional limitations (see below for any additional details).  Pt appearing weaker than initial evaluation: d/t this PT recommendation updated to STR (CM notified).  Plan of care reviewed and remains appropriate.    Follow Up Recommendations SNF    Equipment Recommendations   (pt has RW at home already)    Recommendations for Other Services       Precautions / Restrictions Precautions Precautions: Fall Precaution Comments: Rectal tube Restrictions Weight Bearing Restrictions: No      Mobility  Bed Mobility Overal bed mobility: Needs Assistance Bed Mobility: Supine to Sit     Supine to sit: Min assist;Mod assist;+2 for physical assistance;HOB  elevated Sit to supine: Min assist;Mod assist;+2 for physical assistance (2 assist to boost pt up in bed using bed sheets)   General bed mobility comments: vc's for technique; use of bed sheets to prevent sheering d/t rectal tube  Transfers                 General transfer comment: Deferred  Ambulation/Gait             General Gait Details: Deferred  Stairs            Wheelchair Mobility    Modified Rankin (Stroke Patients Only)       Balance Overall balance assessment: Needs assistance Sitting-balance support: No upper extremity supported;Feet supported Sitting balance-Leahy Scale: Good Sitting balance - Comments: steady sitting reaching within BOS                                     Pertinent Vitals/Pain Pain Assessment: Faces Faces Pain Scale: Hurts even more (intermittent abdominal pain/cramping 6/10 (2/10 otherwise)) Pain Location: abdomen Pain Descriptors / Indicators: Cramping;Grimacing Pain Intervention(s): Limited activity within patient's tolerance;Monitored during session;Repositioned;Premedicated before session  Vitals (HR and O2 on 3 L O2 via nasal cannula) stable and WFL throughout treatment session.    Home Living Family/patient expects to be discharged to:: Private residence Living Arrangements: Spouse/significant other Available Help at Discharge: Family Type of Home: House Home Access: Stairs to enter Entrance Stairs-Rails: Left Entrance Stairs-Number of Steps: 3 (from garage)  Home Layout: One level Home Equipment: Walker - 2 wheels;Cane - single point;Grab bars - tub/shower      Prior Function Level of Independence: Independent with assistive device(s)         Comments: Uses SPC mostly but will use RW if doing a lot of walking.  1 fall about 5 months ago (passed out?).     Hand Dominance        Extremity/Trunk Assessment   Upper Extremity Assessment Upper Extremity Assessment: Generalized weakness     Lower Extremity Assessment Lower Extremity Assessment: Generalized weakness    Cervical / Trunk Assessment Cervical / Trunk Assessment: Other exceptions (forward head/shoulders)  Communication   Communication: No difficulties  Cognition Arousal/Alertness:  (Drowsy initially but more alert once sitting edge of bed) Behavior During Therapy: Flat affect (occasionally briefly smiling) Overall Cognitive Status: Within Functional Limits for tasks assessed                                        General Comments   Nursing cleared pt for participation in physical therapy.  Pt agreeable to PT session.  Pt's wife present during session.    Exercises     Assessment/Plan    PT Assessment Patient needs continued PT services  PT Problem List Decreased strength;Decreased activity tolerance;Decreased balance;Decreased mobility;Cardiopulmonary status limiting activity       PT Treatment Interventions DME instruction;Gait training;Stair training;Functional mobility training;Therapeutic activities;Therapeutic exercise;Balance training;Patient/family education    PT Goals (Current goals can be found in the Care Plan section)  Acute Rehab PT Goals Patient Stated Goal: to improve strength and mobility PT Goal Formulation: With patient Time For Goal Achievement: 10/28/19 Potential to Achieve Goals: Good    Frequency Min 2X/week   Barriers to discharge        Co-evaluation               AM-PAC PT "6 Clicks" Mobility  Outcome Measure Help needed turning from your back to your side while in a flat bed without using bedrails?: A Little Help needed moving from lying on your back to sitting on the side of a flat bed without using bedrails?: A Lot Help needed moving to and from a bed to a chair (including a wheelchair)?: A Lot Help needed standing up from a chair using your arms (e.g., wheelchair or bedside chair)?: A Lot Help needed to walk in hospital room?: A Lot Help  needed climbing 3-5 steps with a railing? : Total 6 Click Score: 12    End of Session Equipment Utilized During Treatment: Oxygen Activity Tolerance: Patient limited by fatigue Patient left: in bed;with call bell/phone within reach;with bed alarm set;with family/visitor present Nurse Communication: Mobility status;Precautions;Other (comment) (pt's wife requesting nurse to look at posterior L lower leg bandage (was partially coming off prior to any mobility)) PT Visit Diagnosis: Other abnormalities of gait and mobility (R26.89);Muscle weakness (generalized) (M62.81);History of falling (Z91.81);Pain Pain - Right/Left: Left Pain - part of body: Leg    Time: 2409-7353 PT Time Calculation (min) (ACUTE ONLY): 54 min   Charges:   PT Evaluation $PT Re-evaluation: 1 Re-eval PT Treatments $Therapeutic Exercise: 8-22 mins $Therapeutic Activity: 8-22 mins       Leitha Bleak, PT 10/14/19, 4:17 PM

## 2019-10-14 NOTE — Progress Notes (Signed)
[facility] Cardiology  SUBJECTIVE: Patient laying in bed, reports feeling somewhat better, with less shortness of breath   Vitals:   10/13/19 2111 10/13/19 2250 10/14/19 0424 10/14/19 0742  BP: (!) 115/37  110/67 101/66  Pulse: (!) 108  98 87  Resp: (!) 21 19 20 17   Temp: 99.5 F (37.5 C) 97.7 F (36.5 C) 99 F (37.2 C) 98.3 F (36.8 C)  TempSrc: Oral Oral Oral Oral  SpO2: 100%  98% 100%  Weight:   129.5 kg   Height:         Intake/Output Summary (Last 24 hours) at 10/14/2019 0820 Last data filed at 10/14/2019 4132 Gross per 24 hour  Intake 2255.15 ml  Output 1150 ml  Net 1105.15 ml      PHYSICAL EXAM  General: Well developed, well nourished, in no acute distress HEENT:  Normocephalic and atramatic Neck:  No JVD.  Lungs: Clear bilaterally to auscultation and percussion. Heart: HRRR . Normal S1 and S2 without gallops or murmurs.  Abdomen: Bowel sounds are positive, abdomen soft and non-tender  Msk:  Back normal, normal gait. Normal strength and tone for age. Extremities: No clubbing, cyanosis or edema.   Neuro: Alert and oriented X 3. Psych:  Good affect, responds appropriately   LABS: Basic Metabolic Panel: Recent Labs    10/12/19 0427 10/12/19 1310 10/13/19 0629 10/14/19 0657  NA 137   < > 137 136  K 3.4*   < > 3.4* 3.5  CL 97*   < > 94* 94*  CO2 31   < > 32 33*  GLUCOSE 146*   < > 139* 151*  BUN 29*   < > 41* 42*  CREATININE 1.23   < > 1.40* 1.57*  CALCIUM 7.8*   < > 7.7* 7.7*  MG 1.6*  --   --  2.1  PHOS 1.7*  --   --   --    < > = values in this interval not displayed.   Liver Function Tests: Recent Labs    10/13/19 0629 10/14/19 0657  AST 35 40  ALT 17 16  ALKPHOS 74 78  BILITOT 1.5* 1.4*  PROT 6.2* 6.3*  ALBUMIN 2.0* 2.1*   No results for input(s): LIPASE, AMYLASE in the last 72 hours. CBC: Recent Labs    10/12/19 0427 10/12/19 0427 10/12/19 1310 10/12/19 1310 10/13/19 0629 10/14/19 0657  WBC 9.0   < > 8.8   < > 12.2* 13.4*   NEUTROABS 6.2  --  5.9  --   --   --   HGB 7.9*   < > 7.8*   < > 7.2* 7.5*  HCT 25.2*   < > 25.0*   < > 23.3* 23.4*  MCV 96.6   < > 97.7   < > 97.9 95.5  PLT 199   < > 224   < > 241 221   < > = values in this interval not displayed.   Cardiac Enzymes: No results for input(s): CKTOTAL, CKMB, CKMBINDEX, TROPONINI in the last 72 hours. BNP: Invalid input(s): POCBNP D-Dimer: No results for input(s): DDIMER in the last 72 hours. Hemoglobin A1C: No results for input(s): HGBA1C in the last 72 hours. Fasting Lipid Panel: No results for input(s): CHOL, HDL, LDLCALC, TRIG, CHOLHDL, LDLDIRECT in the last 72 hours. Thyroid Function Tests: No results for input(s): TSH, T4TOTAL, T3FREE, THYROIDAB in the last 72 hours.  Invalid input(s): FREET3 Anemia Panel: No results for input(s): VITAMINB12, FOLATE, FERRITIN, TIBC, IRON,  RETICCTPCT in the last 72 hours.  No results found.   Echo LVEF 60-65%  TELEMETRY: Atrial fibrillation:  ASSESSMENT AND PLAN:  Principal Problem:   (HFpEF) heart failure with preserved ejection fraction (HCC) Active Problems:   Diabetes mellitus ([place])   Benign essential HTN   Benign prostatic hyperplasia with urinary obstruction   Chronic atrial fibrillation (HCC)   Morbid (severe) obesity due to excess calories (HCC)   NASH (nonalcoholic steatohepatitis)   Bilateral carotid artery stenosis   CKD stage 3 due to type 2 diabetes mellitus (HCC)   Acute exacerbation of CHF (congestive heart failure) (HCC)   Leg wound, left, initial encounter    1. Acute on chronic diastolic CHF,presenting with weight gain, progressive exertional dyspnea, peripheral edema, and orthopnea, with anasarca, currently on Lasix drip.Weight 133.3 kg today, down from 141.8 kg on 9/14. 2.Chronic atrial fibrillation, on metoprolol tartrate 100 mg twice daily for rate control, warfarin on hold due tobleeding, and now hemoccult possitve.Elevated ventricular rate to 110s-120s bpm, RVR likely  compensatory secondary to GI bleed and anemia, patient asymptomatic. 3.Anemia,hemoglobin7.5, underwent colonoscopy which revealed bleeding visible vessel second portion of duodenum underwent hemostatic clips 5. NASH, GI following  Recommendations  1.  Agree with current therapy 2.  Continue diuresis 3.  Carefully monitor renal status 4.  Monitor daily weights and  I&O's 5.  Defer further cardiac diagnostics at this time 6.  Follow-up with Dr. Nehemiah Massed after discharge   Isaias Cowman, MD, PhD, [facility] 10/14/2019 8:20 AM

## 2019-10-14 NOTE — Care Management Important Message (Signed)
Important Message  Patient Details  Name: Christian Berg MRN: [id number] Date of Birth: [birth year]   Medicare Important Message Given:  Yes     [name] 10/14/2019, 2:11 PM

## 2019-10-15 LAB — COMPREHENSIVE METABOLIC PANEL
ALT: 15 U/L (ref 0–44)
AST: 42 U/L — ABNORMAL HIGH (ref 15–41)
Albumin: 2.1 g/dL — ABNORMAL LOW (ref 3.5–5.0)
Alkaline Phosphatase: 84 U/L (ref 38–126)
Anion gap: 9 (ref 5–15)
BUN: 37 mg/dL — ABNORMAL HIGH (ref 8–23)
CO2: 33 mmol/L — ABNORMAL HIGH (ref 22–32)
Calcium: 7.7 mg/dL — ABNORMAL LOW (ref 8.9–10.3)
Chloride: 93 mmol/L — ABNORMAL LOW (ref 98–111)
Creatinine, Ser: 1.48 mg/dL — ABNORMAL HIGH (ref 0.61–1.24)
GFR calc Af Amer: 55 mL/min — ABNORMAL LOW (ref >60)
GFR calc non Af Amer: 47 mL/min — ABNORMAL LOW (ref >60)
Glucose, Bld: 175 mg/dL — ABNORMAL HIGH (ref 70–99)
Potassium: 3.2 mmol/L — ABNORMAL LOW (ref 3.5–5.1)
Sodium: 135 mmol/L (ref 135–145)
Total Bilirubin: 1.2 mg/dL (ref 0.3–1.2)
Total Protein: 6.3 g/dL — ABNORMAL LOW (ref 6.5–8.1)

## 2019-10-15 LAB — CBC
HCT: 22.7 % — ABNORMAL LOW (ref 39.0–52.0)
Hemoglobin: 7.1 g/dL — ABNORMAL LOW (ref 13.0–17.0)
MCH: 29.8 pg (ref 26.0–34.0)
MCHC: 31.3 g/dL (ref 30.0–36.0)
MCV: 95.4 fL (ref 80.0–100.0)
Platelets: 201 10*3/uL (ref 150–400)
RBC: 2.38 MIL/uL — ABNORMAL LOW (ref 4.22–5.81)
RDW: 23 % — ABNORMAL HIGH (ref 11.5–15.5)
WBC: 11.2 10*3/uL — ABNORMAL HIGH (ref 4.0–10.5)
nRBC: 0 % (ref 0.0–0.2)

## 2019-10-15 LAB — GLUCOSE, CAPILLARY
Glucose-Capillary: 172 mg/dL — ABNORMAL HIGH (ref 70–99)
Glucose-Capillary: 183 mg/dL — ABNORMAL HIGH (ref 70–99)
Glucose-Capillary: 189 mg/dL — ABNORMAL HIGH (ref 70–99)
Glucose-Capillary: 214 mg/dL — ABNORMAL HIGH (ref 70–99)

## 2019-10-15 LAB — PROTIME-INR
INR: 1.7 — ABNORMAL HIGH (ref 0.8–1.2)
Prothrombin Time: 19 seconds — ABNORMAL HIGH (ref 11.4–15.2)

## 2019-10-15 LAB — MAGNESIUM: Magnesium: 2 mg/dL (ref 1.7–2.4)

## 2019-10-15 MED ORDER — FENTANYL CITRATE (PF) 100 MCG/2ML IJ SOLN
25.0000 ug | INTRAMUSCULAR | Status: DC | PRN
  Administered 2019-10-16: 25 ug via INTRAVENOUS
  Filled 2019-10-15: qty 2

## 2019-10-15 MED ORDER — MORPHINE SULFATE (PF) 2 MG/ML IV SOLN
2.0000 mg | Freq: Once | INTRAVENOUS | Status: AC
  Administered 2019-10-15: 2 mg via INTRAVENOUS
  Filled 2019-10-15: qty 1

## 2019-10-15 MED ORDER — SODIUM CHLORIDE 0.9 % IV SOLN
1.0000 g | INTRAVENOUS | Status: AC
  Administered 2019-10-16: 1 g via INTRAVENOUS
  Filled 2019-10-15: qty 10

## 2019-10-15 MED ORDER — POTASSIUM CITRATE ER 10 MEQ (1080 MG) PO TBCR
20.0000 meq | EXTENDED_RELEASE_TABLET | Freq: Three times a day (TID) | ORAL | Status: DC
  Administered 2019-10-15 – 2019-10-16 (×2): 20 meq via ORAL
  Filled 2019-10-15 (×4): qty 2

## 2019-10-15 NOTE — Evaluation (Signed)
Occupational Therapy Evaluation Patient Details Name: Christian Berg MRN: [id number] DOB: Apr 12, 1950 Today's Date: 10/15/2019    History of Present Illness Pt is a 69 y.o. male presenting to hospital 9/11 with worsening orthopnea, exertional SOB, worsening LE edema, and developing wounds B LE's.  Pt admitted with acute exacerbation of diastolic CHF and leg wounds; also c/o L LE pain.  PMH inlcudes a-fib, CHF, DM, carotid stenosis, htn, pacemaker, PVD, and TIA.   Clinical Impression   Christian Berg was seen for OT evaluation this date. Prior to hospital admission, pt was MOD I for mobility using SPC and RW. Pt lives c wife in [facility]. Pt presents to acute OT demonstrating impaired ADL performance and functional mobility 2/2 functional strength/ROM/balance deficits, decreased activity tolerance, pain, and decreased safety awareness. Pt currently requires MAX A for LBD at bed level. TOTAL A for toileting (rectal tube and purewick). Pt grimacing from abdominal cramping pain rating it 8/10 - requesting pain meds, RN in room at end of session to assess and deliver meds. Pt would benefit from skilled OT to address noted impairments and functional limitations (see below for any additional details) in order to maximize safety and independence while minimizing falls risk and caregiver burden. Upon hospital discharge, recommend STR to maximize pt safety and return to PLOF.     Follow Up Recommendations  SNF    Equipment Recommendations  Other (comment) (TBD at next venue of care)    Recommendations for Other Services       Precautions / Restrictions Precautions Precautions: Fall Precaution Comments: Rectal tube Restrictions Weight Bearing Restrictions: No      Mobility Bed Mobility Overal bed mobility: Needs Assistance             General bed mobility comments: MIN A adjust torso - pt deferred mobility 2/2 pain (RN and OT in room asked pt multiple times if he wanted to adjust positions for  lunch and pt deferred)  Transfers   Deferred       ADL either performed or assessed with clinical judgement   ADL Overall ADL's : Needs assistance/impaired    General ADL Comments: MAX A for LBD at bed level. TOTAL A for toileting (rectal tube and purewick)                  Pertinent Vitals/Pain Pain Assessment: 0-10 Pain Score: 8  Pain Location: abdominal cramping and general malaise Pain Descriptors / Indicators: Cramping;Grimacing Pain Intervention(s): Limited activity within patient's tolerance;Monitored during session;Patient requesting pain meds-RN notified     Hand Dominance Right   Extremity/Trunk Assessment Upper Extremity Assessment Upper Extremity Assessment: Generalized weakness (BUE 4+/5 grossly), LUE AROM limited ~90* (pt reports rod)   Lower Extremity Assessment Lower Extremity Assessment: Generalized weakness       Communication Communication Communication: No difficulties   Cognition Arousal/Alertness: Awake/alert Behavior During Therapy: WFL for tasks assessed/performed;Flat affect (makes jokes however majority flat affect) Overall Cognitive Status: Within Functional Limits for tasks assessed        General Comments       Exercises Exercises: Other exercises Other Exercises Other Exercises: Pt educated re: OT role, DME recs, d/c recs, importance of mobility for functional strengthening, HEP Other Exercises: simulated LBD/UBD   Shoulder Instructions      Home Living Family/patient expects to be discharged to:: Private residence Living Arrangements: Spouse/significant other Available Help at Discharge: Family Type of Home: House Home Access: Stairs to enter [employer] of Steps: 3 (from garage) Entrance  Stairs-Rails: Left Home Layout: One level     Bathroom Shower/Tub: Occupational psychologist: Handicapped height     Home Equipment: Environmental consultant - 2 wheels;Cane - single point;Grab bars - tub/shower           Prior Functioning/Environment Level of Independence: Independent with assistive device(s)        Comments: Uses SPC mostly but will use RW if doing a lot of walking.  1 fall about 5 months ago (passed out?).        OT Problem List: Decreased strength;Decreased range of motion;Impaired balance (sitting and/or standing);Decreased activity tolerance;Decreased safety awareness;Decreased knowledge of use of DME or AE;Pain      OT Treatment/Interventions: Self-care/ADL training;Therapeutic exercise;Energy conservation;DME and/or AE instruction;Therapeutic activities;Patient/family education;Balance training    OT Goals(Current goals can be found in the care plan section) Acute Rehab OT Goals Patient Stated Goal: to improve strength and mobility OT Goal Formulation: With patient Time For Goal Achievement: 10/29/19 Potential to Achieve Goals: Good ADL Goals Pt Will Perform Grooming: with modified independence;bed level Pt Will Transfer to Toilet: with min assist;with +2 assist (rolling at bed level) Pt/caregiver will Perform Home Exercise Program: Increased strength;Increased ROM;Both right and left upper extremity  OT Frequency: Min 1X/week   Barriers to D/C: Inaccessible home environment;Decreased caregiver support             AM-PAC OT "6 Clicks" Daily Activity     Outcome Measure Help from another person eating meals?: None Help from another person taking care of personal grooming?: A Little Help from another person toileting, which includes using toliet, bedpan, or urinal?: Total Help from another person bathing (including washing, rinsing, drying)?: A Lot Help from another person to put on and taking off regular upper body clothing?: A Lot Help from another person to put on and taking off regular lower body clothing?: A Lot 6 Click Score: 14   End of Session    Activity Tolerance: Patient limited by pain Patient left: in bed;with call bell/phone within reach;with  nursing/sitter in room  OT Visit Diagnosis: Muscle weakness (generalized) (M62.81);Other abnormalities of gait and mobility (R26.89)                Time: 1217-1227 OT Time Calculation (min): 10 min Charges:  OT General Charges $OT Visit: 1 Visit OT Evaluation $OT Eval Low Complexity: 1 Low OT Treatments $Self Care/Home Management : 8-22 mins  Dessie Coma, M.S. OTR/L  10/15/19, 12:55 PM  ascom [phone]

## 2019-10-15 NOTE — Plan of Care (Signed)

## 2019-10-15 NOTE — Progress Notes (Signed)
Notified by another staff nurse that patient had begun moaning very loud and yelling that he was going to die for being in pain. Patient had just received morphine per PRN order and therefore it was too early to give. Staff RN notified on-call physician. Orders given for one time dose of 3m Morphine. Administered Morphine per order. V/S are stable 118/59, HR-105, T-99.0 Oxygen 100% on 2-3 L. Will continue to monitor to end of shift.

## 2019-10-15 NOTE — Progress Notes (Addendum)
[name]  PROGRESS NOTE    Christian Berg  UMP:[id number] DOB: 1950/02/21 DOA: 10/08/2019 PCP: Birdie Sons, MD   Brief Narrative: HPI per Dr. Gala Romney on 10/09/19 Christian Berg is a 69 y.o. male with medical history significant of diabetes, hypertension, diastolic dysfunction CHF, disseminated superficial actinic porokeratosis, degenerative disc disease, recent bleeding episodes, atrial fibrillation and peripheral vascular disease who presented to the ER with left lower extremity ulcer.  He already has a right lower extremity ulcer.  He came in for wound check.  Patient has noted increasing weight about 25 pounds more in the last few weeks.  He is taking diuretics at home.  He also has noted increasing shortness of breath.  He has bruising easily and was told to stop his Coumadin about a week ago.  Patient was seen in the ER in addition to his leg wounds was found to be having episode of anasarca.  Appears he has acute exacerbation of his diastolic CHF so he is being admitted to the hospital for further evaluation and treatment..  ED Course: Temperature is 99.1 blood pressure 130/47 pulse 140 respirate 26 oxygen sats 94% on room air.  White count 9.0 hemoglobin 9.3 and platelet 235.  Chemistry appears to be within normal except for creatinine 1.38 and calcium 8.4.  Glucose 145.  COVID-19 screen is negative.  Chest x-ray showed evidence of mild fluid overload.  X-ray of the tibia-fibula shows no evidence of osseous abnormality.  Patient being admitted with acute exacerbation of CHF and also possible infected wound.  **Interim History  Is started on IV Lasix and this was changed to Lasix drip.  Wound care was consulted for the left lower extremity and cefepime was started for infection.  He started having significant amount of diarrhea but clinically do not suspect C. difficile unlikely this is antibiotic mediated diarrhea.  He is currently afebrile and has no leukocytosis.  Still remains very  swollen so cardiology was consulted for further evaluation as he remains on Lasix drip. On 9/15 worsening diarrhea, fecal occult blood psoitive, and down trendinghemoglobin. Gi consulted, EGD on 9/16 showed actively bleeding duodenal lesion that was clipped. H to low 7s, transfused one unit. On 9/17 H stable in 7s, no emesis. On 9/18 remains on IV lasix drip, hgb stable low 7s.  Assessment & Plan:   Principal Problem:   (HFpEF) heart failure with preserved ejection fraction (HCC) Active Problems:   Diabetes mellitus ([place])   Benign essential HTN   Benign prostatic hyperplasia with urinary obstruction   Chronic atrial fibrillation (HCC)   Morbid (severe) obesity due to excess calories (HCC)   NASH (nonalcoholic steatohepatitis)   Bilateral carotid artery stenosis   CKD stage 3 due to type 2 diabetes mellitus (HCC)   Acute exacerbation of CHF (congestive heart failure) (HCC)   Leg wound, left, initial encounter  Acute on Chronic Diastolic Congestive Heart Failure. -Patient presented with evidence of bichamber congestive heart failure.  Severe volume overload with anasarca. -BNP on admission was 399.1 -Reviewed patient most recent echocardiogram performed in 08/05/2019, ejection fraction was normal. -No significant pulmonary hypertension. -Currently on lasix infusion with fair urine output. Have spoken w/ nursing about I/Os, they will try a different type of catheter as condom cath not staying -Continues to have evidence of volume overload. -Monitor electrolytes and renal function, cr stable -Continue to monitor for signs and symptoms of volume overload -Continue with metoprolol tartrate 100 g p.o. twice daily - continue [place] O2  currently on 2 liters, wean as able - start incentive spirometry -Cardiology was consulted for further evaluation  Left leg wound with cellulitis in the setting of DSAP -Patient has a bilateral leg edema appears to be secondary to venous stasis and congestive heart  failure.   -Posterior left leg also has ulcer, appears to be infected on admission. Appears to be resolved  -Wound care has been consulted.  have discontinued cefepime given apparent resolution of cellulitis and now diarrhea possibly antibiotic-associated  Bleeding duodenal lesion Acute blood loss anemia EGD 9/16, actively bleeding lesion clipped.  gi pathogen panel negative 1 U transfused 9/16, pt tolerated well Inserted Flexi-Seal given his amount of diarrhea - consulted ID for c diff testing, advised against given afebrile, no leukocytosis - appreciate GI recs. Cont ceftriaxone, continue bid ppi  Chronic Atrial fibrillation with rapid ventricular response. -Patient was taken off of warfarin recently due to concern of skin bleeding.  Patient has some tachycardia, continue beta-blocker. -INR prior to admission was >5  holding on anticoagulation given active bleed  Hypokalemia - 3.2 this am, mg wnl - increase kcl from bid to tid  Type 2 Diabetes Mellitus -Continue to Hold Metformin -Continue sliding scale insulin.  -CBG's low 100s   NASH liver cirrhosis. -Continue to monitor. -Check LFTs in the morning - ID advises 5 days total of ceftriaxone for sbp ppx (9/15>   Chronic kidney disease stage IIIa. - mild cr bump today, monitor  Essential Hypertension. - stable   Morbid Obesity -Estimated body mass index is 43.14 kg/m as calculated from the following:   Height as of this encounter: 5' 8"  (1.727 m).   Weight as of this encounter: 128.7 kg. -Weight Loss and Dietary Counseling given   DVT prophylaxis: TED hose Code Status: FULL CODE  Family Communication: No family present at bedside  Disposition Plan: guarded. Appreciate GI and cardiology consults. Transfer to progressive bed  Status is: Inpatient  Remains inpatient appropriate because:Unsafe d/c plan, IV treatments appropriate due to intensity of illness or inability to take PO and Inpatient level of care  appropriate due to severity of illness   Dispo: The patient is from: Home              Anticipated d/c is to: TBD              Anticipated d/c date is: 5 days              Patient currently is not medically stable to d/c.  Consultants:   Cardiology  GI   Procedures:   EGD 9/16  Blood transfusion 9/16  Lines/Tubes - rectal tube  Antimicrobials:  Anti-infectives (From admission, onward)   Start     Dose/Rate Route Frequency Ordered Stop   10/12/19 1400  cefTRIAXone (ROCEPHIN) 2 g in sodium chloride 0.9 % 100 mL IVPB        2 g 200 mL/hr over 30 Minutes Intravenous Every 24 hours 10/12/19 1327 10/17/19 1359   10/11/19 2100  ceFEPIme (MAXIPIME) 2 g in sodium chloride 0.9 % 100 mL IVPB  Status:  Discontinued        2 g 200 mL/hr over 30 Minutes Intravenous Every 8 hours 10/10/19 1853 10/10/19 1859   10/10/19 1900  ceFEPIme (MAXIPIME) 2 g in sodium chloride 0.9 % 100 mL IVPB  Status:  Discontinued        2 g 200 mL/hr over 30 Minutes Intravenous Every 8 hours 10/10/19 1859 10/12/19 1215   10/09/19  0000  ceFEPIme (MAXIPIME) 2 g in sodium chloride 0.9 % 100 mL IVPB  Status:  Discontinued        2 g 200 mL/hr over 30 Minutes Intravenous Every 8 hours 10/08/19 2346 10/10/19 1853        Subjective: Slightly more energy today. Chronic leg and abd pain.  Denies sig SOB. No vomiting  Objective: Vitals:   10/15/19 0354 10/15/19 0431 10/15/19 0838 10/15/19 1147  BP: 123/66 (!) 118/59 (!) 112/59 (!) 114/53  Pulse: (!) 105 (!) 105 (!) 115 86  Resp: 20 20  19   Temp: 98.8 F (37.1 C)  98.4 F (36.9 C) 98.9 F (37.2 C)  TempSrc: Oral  Oral Oral  SpO2: 99% 100% 99% 100%  Weight: 128.7 kg     Height:        Intake/Output Summary (Last 24 hours) at 10/15/2019 1502 Last data filed at 10/15/2019 0848 Gross per 24 hour  Intake 1297.11 ml  Output --  Net 1297.11 ml   Filed Weights   10/13/19 1232 10/14/19 0424 10/15/19 0354  Weight: 132.7 kg 129.5 kg 128.7 kg    Examination: Physical Exam:  Constitutional: WN/WD morbidly obese Caucasian male currently in no acute distress  Eyes: Lids and conjunctivae normal, sclerae anicteric  ENMT: MMM Neck: Appears normal, supple,  Respiratory: Diminished to auscultation bilaterally with rales at bases,  no wheezing, , rhonchi or crackles. Normal respiratory effort and patient is not tachypenic. No accessory muscle use and has unlabored breathing.   Cardiovascular: Irregularly irregular and slightly tachycardic, no murmurs / rubs / gallops.  1-2+ lower extremity edema Abdomen: Soft, non-tender, distended secondary body habitus.  Bowel sounds positive.  GU: Deferred. Musculoskeletal: No clubbing / cyanosis of digits/nails. No joint deformity upper and lower extremities.  Skin: Has multiple skin lesions in the lower extremities and he states that this is disseminated superficial actinic porokeratosis. No induration; Warm and dry. Ulcer left calf. Pale. Neurologic: CN 2-12 grossly intact with no focal deficits. Romberg sign and cerebellar reflexes not assessed.  Psychiatric: Normal judgment and insight. Alert and oriented x 3.  Anxious mood and appropriate affect.   Data Reviewed: I have personally reviewed following labs and imaging studies  CBC: Recent Labs  Lab 10/09/19 0801 10/09/19 0801 10/10/19 0701 10/11/19 0522 10/12/19 0427 10/12/19 1310 10/13/19 0629 10/14/19 0657 10/15/19 0608  WBC 9.8   < > 9.9   < > 9.0 8.8 12.2* 13.4* 11.2*  NEUTROABS 7.8*  --  7.5  --  6.2 5.9  --   --   --   HGB 8.7*   < > 9.7*   < > 7.9* 7.8* 7.2* 7.5* 7.1*  HCT 27.2*   < > 29.0*   < > 25.2* 25.0* 23.3* 23.4* 22.7*  MCV 95.1   < > 92.9   < > 96.6 97.7 97.9 95.5 95.4  PLT 216   < > 205   < > 199 224 241 221 201   < > = values in this interval not displayed.   Basic Metabolic Panel: Recent Labs  Lab 10/10/19 0701 10/11/19 0522 10/12/19 0427 10/12/19 1310 10/13/19 0629 10/14/19 0657 10/15/19 0608  NA 137   < >  137 136 137 136 135  K 3.7   < > 3.4* 3.5 3.4* 3.5 3.2*  CL 98   < > 97* 95* 94* 94* 93*  CO2 28   < > 31 31 32 33* 33*  GLUCOSE 145*   < >  146* 136* 139* 151* 175*  BUN 18   < > 29* 34* 41* 42* 37*  CREATININE 1.53*   < > 1.23 1.39* 1.40* 1.57* 1.48*  CALCIUM 8.5*   < > 7.8* 7.8* 7.7* 7.7* 7.7*  MG 1.7  --  1.6*  --   --  2.1 2.0  PHOS  --   --  1.7*  --   --   --   --    < > = values in this interval not displayed.   GFR: Estimated Creatinine Clearance: 60.8 mL/min (A) (by C-G formula based on SCr of 1.48 mg/dL (H)). Liver Function Tests: Recent Labs  Lab 10/12/19 0427 10/12/19 1310 10/13/19 0629 10/14/19 0657 10/15/19 0608  AST 40 40 35 40 42*  ALT 21 19 17 16 15   ALKPHOS 79 78 74 78 84  BILITOT 2.0* 1.9* 1.5* 1.4* 1.2  PROT 6.3* 6.4* 6.2* 6.3* 6.3*  ALBUMIN 2.1* 2.1* 2.0* 2.1* 2.1*   No results for input(s): LIPASE, AMYLASE in the last 168 hours. No results for input(s): AMMONIA in the last 168 hours. Coagulation Profile: Recent Labs  Lab 10/11/19 0522 10/12/19 1013 10/13/19 0629 10/14/19 0657 10/15/19 0608  INR 2.0* 1.9* 1.8* 1.7* 1.7*   Cardiac Enzymes: No results for input(s): CKTOTAL, CKMB, CKMBINDEX, TROPONINI in the last 168 hours. BNP (last 3 results) No results for input(s): PROBNP in the last 8760 hours. HbA1C: No results for input(s): HGBA1C in the last 72 hours. CBG: Recent Labs  Lab 10/14/19 1132 10/14/19 1701 10/14/19 2104 10/15/19 0827 10/15/19 1145  GLUCAP 179* 204* 179* 172* 183*   Lipid Profile: No results for input(s): CHOL, HDL, LDLCALC, TRIG, CHOLHDL, LDLDIRECT in the last 72 hours. Thyroid Function Tests: No results for input(s): TSH, T4TOTAL, FREET4, T3FREE, THYROIDAB in the last 72 hours. Anemia Panel: No results for input(s): VITAMINB12, FOLATE, FERRITIN, TIBC, IRON, RETICCTPCT in the last 72 hours. Sepsis Labs: No results for input(s): PROCALCITON, LATICACIDVEN in the last 168 hours.  Recent Results (from the past 240  hour(s))  SARS Coronavirus 2 by RT PCR (hospital order, performed in [facility] hospital lab) Nasopharyngeal Nasopharyngeal Swab     Status: None   Collection Time: 10/09/19 12:02 AM   Specimen: Nasopharyngeal Swab  Result Value Ref Range Status   SARS Coronavirus 2 NEGATIVE NEGATIVE Final    Comment: (NOTE) SARS-CoV-2 target nucleic acids are NOT DETECTED.  The SARS-CoV-2 RNA is generally detectable in upper and lower respiratory specimens during the acute phase of infection. The lowest concentration of SARS-CoV-2 viral copies this assay can detect is 250 copies / mL. A negative result does not preclude SARS-CoV-2 infection and should not be used as the sole basis for treatment or other patient management decisions.  A negative result may occur with improper specimen collection / handling, submission of specimen other than nasopharyngeal swab, presence of viral mutation(s) within the areas targeted by this assay, and inadequate number of viral copies (<250 copies / mL). A negative result must be combined with clinical observations, patient history, and epidemiological information.  Fact Sheet for Patients:   StrictlyIdeas.no  Fact Sheet for Healthcare Providers: BankingDealers.co.za  This test is not yet approved or  cleared by the [place] FDA and has been authorized for detection and/or diagnosis of SARS-CoV-2 by FDA under an Emergency Use Authorization (EUA).  This EUA will remain in effect (meaning this test can be used) for the duration of the COVID-19 declaration under Section 564(b)(1) of the Act,  21 U.S.C. section 360bbb-3(b)(1), unless the authorization is terminated or revoked sooner.  Performed at [facility], [address] [place], [place] [address]   Gastrointestinal Panel by PCR , Stool     Status: None   Collection Time: 10/11/19  4:07 PM   Specimen: Stool  Result Value Ref Range Status    Campylobacter species NOT DETECTED NOT DETECTED Final   Plesimonas shigelloides NOT DETECTED NOT DETECTED Final   Salmonella species NOT DETECTED NOT DETECTED Final   Yersinia enterocolitica NOT DETECTED NOT DETECTED Final   Vibrio species NOT DETECTED NOT DETECTED Final   Vibrio cholerae NOT DETECTED NOT DETECTED Final   Enteroaggregative E coli (EAEC) NOT DETECTED NOT DETECTED Final   Enteropathogenic E coli (EPEC) NOT DETECTED NOT DETECTED Final   Enterotoxigenic E coli (ETEC) NOT DETECTED NOT DETECTED Final   Shiga like toxin producing E coli (STEC) NOT DETECTED NOT DETECTED Final   Shigella/Enteroinvasive E coli (EIEC) NOT DETECTED NOT DETECTED Final   Cryptosporidium NOT DETECTED NOT DETECTED Final   Cyclospora cayetanensis NOT DETECTED NOT DETECTED Final   Entamoeba histolytica NOT DETECTED NOT DETECTED Final   Giardia lamblia NOT DETECTED NOT DETECTED Final   Adenovirus F40/41 NOT DETECTED NOT DETECTED Final   Astrovirus NOT DETECTED NOT DETECTED Final   Norovirus GI/GII NOT DETECTED NOT DETECTED Final   Rotavirus A NOT DETECTED NOT DETECTED Final   Sapovirus (I, II, IV, and V) NOT DETECTED NOT DETECTED Final    Comment: Performed at [facility], [address], [place], [place] [address]    RN Pressure Injury Documentation:     Estimated body mass index is 43.14 kg/m as calculated from the following:   Height as of this encounter: 5' 8"  (1.727 m).   Weight as of this encounter: 128.7 kg.  Malnutrition Type:      Malnutrition Characteristics:      Nutrition Interventions:    Radiology Studies: No results found.   Scheduled Meds: . sodium chloride   Intravenous Once  . hydrocerin   Topical Daily  . insulin aspart  0-15 Units Subcutaneous TID WC  . metoprolol tartrate  100 mg Oral BID  . pantoprazole (PROTONIX) IV  40 mg Intravenous Q12H  . potassium citrate  20 mEq Oral TID WC  . pravastatin  20 mg Oral q1800  . sodium chloride flush  3 mL  Intravenous Q12H   Continuous Infusions: . sodium chloride    . cefTRIAXone (ROCEPHIN)  IV Stopped (10/14/19 1443)  . furosemide (LASIX) infusion 8 mg/hr (10/14/19 2100)    LOS: 7 days   Desma Maxim, MD [facility] PAGER is on AMION  If 7PM-7AM, please contact night-coverage [email or web address]

## 2019-10-15 NOTE — Progress Notes (Signed)
Patient Name: Christian Berg Date of Encounter: 10/15/2019  Hospital Problem List     Principal Problem:   (HFpEF) heart failure with preserved ejection fraction (HCC) Active Problems:   Diabetes mellitus ([place])   Benign essential HTN   Benign prostatic hyperplasia with urinary obstruction   Chronic atrial fibrillation (HCC)   Morbid (severe) obesity due to excess calories (HCC)   NASH (nonalcoholic steatohepatitis)   Bilateral carotid artery stenosis   CKD stage 3 due to type 2 diabetes mellitus (HCC)   Acute exacerbation of CHF (congestive heart failure) (HCC)   Leg wound, left, initial encounter    Patient Profile     69 year old male with history of diabetes, chronic atrial fibrillation NASH, chronic kidney disease admitted with shortness of breath and heart failure.  Subjective   Less short of breath.  Inpatient Medications    . sodium chloride   Intravenous Once  . hydrocerin   Topical Daily  . insulin aspart  0-15 Units Subcutaneous TID WC  . metoprolol tartrate  100 mg Oral BID  . pantoprazole (PROTONIX) IV  40 mg Intravenous Q12H  . potassium citrate  10 mEq Oral TID WC  . pravastatin  20 mg Oral q1800  . sodium chloride flush  3 mL Intravenous Q12H    Vital Signs    Vitals:   10/14/19 1956 10/15/19 0354 10/15/19 0431 10/15/19 0838  BP: 115/61 123/66 (!) 118/59 (!) 112/59  Pulse: (!) 102 (!) 105 (!) 105 (!) 115  Resp: 18 20 20    Temp: 98.5 F (36.9 C) 98.8 F (37.1 C)  98.4 F (36.9 C)  TempSrc: Oral Oral  Oral  SpO2: 100% 99% 100% 99%  Weight:  128.7 kg    Height:        Intake/Output Summary (Last 24 hours) at 10/15/2019 1126 Last data filed at 10/15/2019 0848 Gross per 24 hour  Intake 1297.11 ml  Output --  Net 1297.11 ml   Filed Weights   10/13/19 1232 10/14/19 0424 10/15/19 0354  Weight: 132.7 kg 129.5 kg 128.7 kg    Physical Exam    GEN: Well nourished, well developed, in no acute distress.  HEENT: normal.  Neck: Supple, no JVD,  carotid bruits, or masses. Cardiac: Irregular irregular Respiratory:  Respirations regular and unlabored, clear to auscultation bilaterally. GI: Soft, nontender, nondistended, BS + x 4. MS: no deformity or atrophy. Skin: warm and dry, no rash. Neuro:  Strength and sensation are intact. Psych: Normal affect.  Labs    CBC Recent Labs    10/12/19 1310 10/13/19 0629 10/14/19 0657 10/15/19 0608  WBC 8.8   < > 13.4* 11.2*  NEUTROABS 5.9  --   --   --   HGB 7.8*   < > 7.5* 7.1*  HCT 25.0*   < > 23.4* 22.7*  MCV 97.7   < > 95.5 95.4  PLT 224   < > 221 201   < > = values in this interval not displayed.   Basic Metabolic Panel Recent Labs    10/14/19 0657 10/15/19 0608  NA 136 135  K 3.5 3.2*  CL 94* 93*  CO2 33* 33*  GLUCOSE 151* 175*  BUN 42* 37*  CREATININE 1.57* 1.48*  CALCIUM 7.7* 7.7*  MG 2.1 2.0   Liver Function Tests Recent Labs    10/14/19 0657 10/15/19 0608  AST 40 42*  ALT 16 15  ALKPHOS 78 84  BILITOT 1.4* 1.2  PROT 6.3* 6.3*  ALBUMIN 2.1* 2.1*   No results for input(s): LIPASE, AMYLASE in the last 72 hours. Cardiac Enzymes No results for input(s): CKTOTAL, CKMB, CKMBINDEX, TROPONINI in the last 72 hours. BNP No results for input(s): BNP in the last 72 hours. D-Dimer No results for input(s): DDIMER in the last 72 hours. Hemoglobin A1C No results for input(s): HGBA1C in the last 72 hours. Fasting Lipid Panel No results for input(s): CHOL, HDL, LDLCALC, TRIG, CHOLHDL, LDLDIRECT in the last 72 hours. Thyroid Function Tests No results for input(s): TSH, T4TOTAL, T3FREE, THYROIDAB in the last 72 hours.  Invalid input(s): FREET3  Telemetry    Atrial fibrillation  ECG    Atrial fibrillation with no ischemia.  Occasional PVC.  Radiology    DG Chest 1 View  Result Date: 10/12/2019 CLINICAL DATA:  Shortness of breath. EXAM: CHEST  1 VIEW COMPARISON:  October 08, 2019 FINDINGS: There is a dual lead AICD. Mild diffusely increased interstitial  lung markings are seen the which is predominant stable in severity when compared to the prior exam. Mild to moderate severity prominence of the perihilar pulmonary vasculature is noted which represents a new finding. There is no evidence of a pleural effusion or pneumothorax. Stable moderate to marked severity enlargement of the cardiac silhouette is seen. A radiopaque fixation plate and multiple fixation screws are seen within the proximal left humerus. IMPRESSION: 1. Stable cardiomegaly with mild to moderate severity pulmonary vascular congestion. Electronically Signed   By: Virgina Norfolk M.D.   On: 10/12/2019 02:13   DG Chest 2 View  Result Date: 10/08/2019 CLINICAL DATA:  Shortness of breath, open wounds to the posterior left lower leg and anterior right lower leg, history of asthma EXAM: CHEST - 2 VIEW COMPARISON:  Radiograph 08/08/2019, CT 09/15/2015 FINDINGS: Diffuse hazy basilar predominant interstitial opacities with some fissural thickening as well. More patchy retrocardiac infrahilar opacities in the lung bases. No pneumothorax or visible effusion. Prominent cardiac silhouette compatible with cardiomegaly seen on comparison studies. Pacer pack overlies the left chest wall with leads at the cardiac apex and right atrium in similar positioning to prior. Remaining cardiomediastinal contours are unremarkable. No acute osseous or soft tissue abnormality. Degenerative changes are present in the imaged spine and shoulders. Prior left humeral ORIF is noted. IMPRESSION: 1. Features of CHF/volume overload with cardiomegaly and interstitial opacities suggesting edema. 2. More patchy retrocardiac and infrahilar opacities in the lung bases may reflect developing alveolar edema, atelectasis or airspace disease. Electronically Signed   By: Lovena Le M.D.   On: 10/08/2019 17:36   DG Tibia/Fibula Left  Result Date: 10/08/2019 CLINICAL DATA:  Open wound to posterior left lower leg. EXAM: LEFT TIBIA AND  FIBULA - 2 VIEW COMPARISON:  None. FINDINGS: There is no evidence of fracture or other focal bone lesions. Mild diffuse soft tissue swelling is seen throughout the left calf with moderate severity soft tissue swelling also noted along the anterior aspect of the left tibia. No soft tissue air is seen. IMPRESSION: 1. Soft tissue swelling which may be secondary to diffuse cellulitis, without evidence of an acute osseous abnormality. Electronically Signed   By: Virgina Norfolk M.D.   On: 10/08/2019 19:49    Assessment & Plan    Principal Problem:   (HFpEF) heart failure with preserved ejection fraction (HCC) Active Problems:   Diabetes mellitus ([place])   Benign essential HTN   Benign prostatic hyperplasia with urinary obstruction   Chronic atrial fibrillation (HCC)   Morbid (severe) obesity due to  excess calories (HCC)   NASH (nonalcoholic steatohepatitis)   Bilateral carotid artery stenosis   CKD stage 3 due to type 2 diabetes mellitus (HCC)   Acute exacerbation of CHF (congestive heart failure) (HCC)   Leg wound, left, initial encounter    1. Acute on chronic diastolic CHF,presenting with weight gain, progressive exertional dyspnea, peripheral edema, and orthopnea, with anasarca, currently on Lasix drip.  Has diuresed 4.6 L since admission.  Creatinine improved from yesterday down to 1.48.  Somewhat hypokalemic at 3.2. 2.Chronic atrial fibrillation, on metoprolol tartrate 100 mg twice daily for rate control, warfarin on hold due tobleeding, and now hemoccult possitve.Elevated ventricular rate to 110s-120s bpm,RVR likely compensatory secondary to GI bleed and anemia,patient asymptomatic. 3.Anemia,hemoglobin7.5, underwent colonoscopy which revealed bleeding visible vessel second portion of duodenum underwent hemostatic clips 5. NASH, GI following  Recommendations  1.  Agree with current therapy 2.  Continue diuresis following hemodynamics and electrolytes.  Hypokalemic today will  need oral replacement. 3.  Carefully monitor renal status 4.  Monitor daily weights and  I&O's 5.  Defer further cardiac diagnostics at this time 6.  Follow-up with Dr. Nehemiah Massed after discharge  Signed, Javier Docker. Peony Barner MD 10/15/2019, 11:26 AM  Pager: [phone]

## 2019-10-16 ENCOUNTER — Inpatient Hospital Stay: Payer: Medicare HMO

## 2019-10-16 LAB — BLOOD GAS, VENOUS
Acid-Base Excess: 12.1 mmol/L — ABNORMAL HIGH (ref 0.0–2.0)
Bicarbonate: 37.5 mmol/L — ABNORMAL HIGH (ref 20.0–28.0)
O2 Saturation: 78.9 %
Patient temperature: 37
pCO2, Ven: 54 mmHg (ref 44.0–60.0)
pH, Ven: 7.45 — ABNORMAL HIGH (ref 7.250–7.430)
pO2, Ven: 41 mmHg (ref 32.0–45.0)

## 2019-10-16 LAB — COMPREHENSIVE METABOLIC PANEL
ALT: 17 U/L (ref 0–44)
ALT: 16 U/L (ref 0–44)
AST: 41 U/L (ref 15–41)
AST: 41 U/L (ref 15–41)
Albumin: 2.2 g/dL — ABNORMAL LOW (ref 3.5–5.0)
Albumin: 2.3 g/dL — ABNORMAL LOW (ref 3.5–5.0)
Alkaline Phosphatase: 88 U/L (ref 38–126)
Alkaline Phosphatase: 97 U/L (ref 38–126)
Anion gap: 9 (ref 5–15)
Anion gap: 11 (ref 5–15)
BUN: 31 mg/dL — ABNORMAL HIGH (ref 8–23)
BUN: 32 mg/dL — ABNORMAL HIGH (ref 8–23)
CO2: 33 mmol/L — ABNORMAL HIGH (ref 22–32)
CO2: 32 mmol/L (ref 22–32)
Calcium: 7.8 mg/dL — ABNORMAL LOW (ref 8.9–10.3)
Calcium: 7.9 mg/dL — ABNORMAL LOW (ref 8.9–10.3)
Chloride: 92 mmol/L — ABNORMAL LOW (ref 98–111)
Chloride: 88 mmol/L — ABNORMAL LOW (ref 98–111)
Creatinine, Ser: 1.45 mg/dL — ABNORMAL HIGH (ref 0.61–1.24)
Creatinine, Ser: 1.46 mg/dL — ABNORMAL HIGH (ref 0.61–1.24)
GFR calc Af Amer: 56 mL/min — ABNORMAL LOW (ref >60)
GFR calc Af Amer: 56 mL/min — ABNORMAL LOW (ref >60)
GFR calc non Af Amer: 48 mL/min — ABNORMAL LOW (ref >60)
GFR calc non Af Amer: 48 mL/min — ABNORMAL LOW (ref >60)
Glucose, Bld: 205 mg/dL — ABNORMAL HIGH (ref 70–99)
Glucose, Bld: 238 mg/dL — ABNORMAL HIGH (ref 70–99)
Potassium: 3.3 mmol/L — ABNORMAL LOW (ref 3.5–5.1)
Potassium: 3.5 mmol/L (ref 3.5–5.1)
Sodium: 134 mmol/L — ABNORMAL LOW (ref 135–145)
Sodium: 131 mmol/L — ABNORMAL LOW (ref 135–145)
Total Bilirubin: 1.1 mg/dL (ref 0.3–1.2)
Total Bilirubin: 1.2 mg/dL (ref 0.3–1.2)
Total Protein: 6.4 g/dL — ABNORMAL LOW (ref 6.5–8.1)
Total Protein: 6.9 g/dL (ref 6.5–8.1)

## 2019-10-16 LAB — CBC
HCT: 23.1 % — ABNORMAL LOW (ref 39.0–52.0)
HCT: 42.9 % (ref 39.0–52.0)
Hemoglobin: 7.5 g/dL — ABNORMAL LOW (ref 13.0–17.0)
Hemoglobin: 13.8 g/dL (ref 13.0–17.0)
MCH: 31 pg (ref 26.0–34.0)
MCH: 30.8 pg (ref 26.0–34.0)
MCHC: 32.5 g/dL (ref 30.0–36.0)
MCHC: 32.2 g/dL (ref 30.0–36.0)
MCV: 95.5 fL (ref 80.0–100.0)
MCV: 95.8 fL (ref 80.0–100.0)
Platelets: 215 10*3/uL (ref 150–400)
Platelets: 154 10*3/uL (ref 150–400)
RBC: 2.42 MIL/uL — ABNORMAL LOW (ref 4.22–5.81)
RBC: 4.48 MIL/uL (ref 4.22–5.81)
RDW: 22.5 % — ABNORMAL HIGH (ref 11.5–15.5)
RDW: 21.2 % — ABNORMAL HIGH (ref 11.5–15.5)
WBC: 12.9 10*3/uL — ABNORMAL HIGH (ref 4.0–10.5)
WBC: 8 10*3/uL (ref 4.0–10.5)
nRBC: 0 % (ref 0.0–0.2)
nRBC: 0 % (ref 0.0–0.2)

## 2019-10-16 LAB — GLUCOSE, CAPILLARY
Glucose-Capillary: 197 mg/dL — ABNORMAL HIGH (ref 70–99)
Glucose-Capillary: 180 mg/dL — ABNORMAL HIGH (ref 70–99)
Glucose-Capillary: 194 mg/dL — ABNORMAL HIGH (ref 70–99)
Glucose-Capillary: 194 mg/dL — ABNORMAL HIGH (ref 70–99)

## 2019-10-16 LAB — URINALYSIS, COMPLETE (UACMP) WITH MICROSCOPIC
Bacteria, UA: NONE SEEN
Bilirubin Urine: NEGATIVE
Glucose, UA: NEGATIVE mg/dL
Ketones, ur: NEGATIVE mg/dL
Leukocytes,Ua: NEGATIVE
Nitrite: NEGATIVE
Protein, ur: 30 mg/dL — AB
Specific Gravity, Urine: 1.009 (ref 1.005–1.030)
pH: 6 (ref 5.0–8.0)

## 2019-10-16 LAB — MAGNESIUM: Magnesium: 2 mg/dL (ref 1.7–2.4)

## 2019-10-16 LAB — TROPONIN I (HIGH SENSITIVITY): Troponin I (High Sensitivity): 16 ng/L (ref <18)

## 2019-10-16 LAB — PROTIME-INR
INR: 1.6 — ABNORMAL HIGH (ref 0.8–1.2)
INR: 1.6 — ABNORMAL HIGH (ref 0.8–1.2)
Prothrombin Time: 18.6 seconds — ABNORMAL HIGH (ref 11.4–15.2)
Prothrombin Time: 18.7 seconds — ABNORMAL HIGH (ref 11.4–15.2)

## 2019-10-16 LAB — HEMOGLOBIN AND HEMATOCRIT, BLOOD
HCT: 25.9 % — ABNORMAL LOW (ref 39.0–52.0)
Hemoglobin: 8.4 g/dL — ABNORMAL LOW (ref 13.0–17.0)

## 2019-10-16 LAB — LACTIC ACID, PLASMA: Lactic Acid, Venous: 2.4 mmol/L (ref 0.5–1.9)

## 2019-10-16 LAB — PREPARE RBC (CROSSMATCH)

## 2019-10-16 LAB — BRAIN NATRIURETIC PEPTIDE: B Natriuretic Peptide: 451.9 pg/mL — ABNORMAL HIGH (ref 0.0–100.0)

## 2019-10-16 LAB — FIBRIN DERIVATIVES D-DIMER (ARMC ONLY): Fibrin derivatives D-dimer (ARMC): 3290.96 ng/mL (FEU) — ABNORMAL HIGH (ref 0.00–499.00)

## 2019-10-16 MED ORDER — METOPROLOL TARTRATE 50 MG PO TABS
50.0000 mg | ORAL_TABLET | Freq: Two times a day (BID) | ORAL | Status: DC
  Administered 2019-10-16 – 2019-10-23 (×14): 50 mg via ORAL
  Filled 2019-10-16 (×14): qty 1

## 2019-10-16 MED ORDER — INSULIN GLARGINE 100 UNIT/ML ~~LOC~~ SOLN
10.0000 [IU] | Freq: Every day | SUBCUTANEOUS | Status: DC
  Administered 2019-10-16 – 2019-10-22 (×7): 10 [IU] via SUBCUTANEOUS
  Filled 2019-10-16 (×8): qty 0.1

## 2019-10-16 MED ORDER — SIMETHICONE 80 MG PO CHEW
80.0000 mg | CHEWABLE_TABLET | Freq: Four times a day (QID) | ORAL | Status: DC | PRN
  Administered 2019-10-16 (×2): 80 mg via ORAL
  Filled 2019-10-16 (×4): qty 1

## 2019-10-16 MED ORDER — CHLORHEXIDINE GLUCONATE CLOTH 2 % EX PADS
6.0000 | MEDICATED_PAD | Freq: Every day | CUTANEOUS | Status: DC
  Administered 2019-10-16 – 2019-10-22 (×6): 6 via TOPICAL

## 2019-10-16 MED ORDER — POTASSIUM CITRATE ER 10 MEQ (1080 MG) PO TBCR
30.0000 meq | EXTENDED_RELEASE_TABLET | Freq: Three times a day (TID) | ORAL | Status: DC
  Administered 2019-10-16 – 2019-10-17 (×3): 30 meq via ORAL
  Filled 2019-10-16 (×4): qty 3

## 2019-10-16 MED ORDER — FUROSEMIDE 40 MG PO TABS: 40.0000 mg | ORAL_TABLET | Freq: Two times a day (BID) | ORAL | Status: DC

## 2019-10-16 MED ORDER — SODIUM CHLORIDE 0.9% IV SOLUTION: Freq: Once | INTRAVENOUS | Status: AC

## 2019-10-16 NOTE — Progress Notes (Addendum)
[name]  PROGRESS NOTE    Christian Berg  YTK:[id number] DOB: May 16, 1950 DOA: 10/08/2019 PCP: Birdie Sons, MD   Brief Narrative: HPI per Dr. Gala Romney on 10/09/19 Christian Berg is a 69 y.o. male with medical history significant of diabetes, hypertension, diastolic dysfunction CHF, disseminated superficial actinic porokeratosis, degenerative disc disease, recent bleeding episodes, atrial fibrillation and peripheral vascular disease who presented to the ER with left lower extremity ulcer.  He already has a right lower extremity ulcer.  He came in for wound check.  Patient has noted increasing weight about 25 pounds more in the last few weeks.  He is taking diuretics at home.  He also has noted increasing shortness of breath.  He has bruising easily and was told to stop his Coumadin about a week ago.  Patient was seen in the ER in addition to his leg wounds was found to be having episode of anasarca.  Appears he has acute exacerbation of his diastolic CHF so he is being admitted to the hospital for further evaluation and treatment..  ED Course: Temperature is 99.1 blood pressure 130/47 pulse 140 respirate 26 oxygen sats 94% on room air.  White count 9.0 hemoglobin 9.3 and platelet 235.  Chemistry appears to be within normal except for creatinine 1.38 and calcium 8.4.  Glucose 145.  COVID-19 screen is negative.  Chest x-ray showed evidence of mild fluid overload.  X-ray of the tibia-fibula shows no evidence of osseous abnormality.  Patient being admitted with acute exacerbation of CHF and also possible infected wound.  **Interim History  Is started on IV Lasix and this was changed to Lasix drip.  Wound care was consulted for the left lower extremity and cefepime was started for infection.  He started having significant amount of diarrhea but clinically do not suspect C. difficile unlikely this is antibiotic mediated diarrhea.  He is currently afebrile and has no leukocytosis.  Still remains very  swollen so cardiology was consulted for further evaluation as he remains on Lasix drip. On 9/15 worsening diarrhea, fecal occult blood psoitive, and down trendinghemoglobin. Gi consulted, EGD on 9/16 showed actively bleeding duodenal lesion that was clipped. H to low 7s, transfused one unit. On 9/17 H stable in 7s, no emesis. On 9/18 remains on IV lasix drip, hgb stable low 7s. 9/19 low BPs  Assessment & Plan:   Principal Problem:   (HFpEF) heart failure with preserved ejection fraction (HCC) Active Problems:   Diabetes mellitus ([place])   Benign essential HTN   Benign prostatic hyperplasia with urinary obstruction   Chronic atrial fibrillation (HCC)   Morbid (severe) obesity due to excess calories (HCC)   NASH (nonalcoholic steatohepatitis)   Bilateral carotid artery stenosis   CKD stage 3 due to type 2 diabetes mellitus (HCC)   Acute exacerbation of CHF (congestive heart failure) (HCC)   Leg wound, left, initial encounter  Low blood pressure Think most likely response to IV lasix with diarrhea contributing. Stopping lasix drip and reducing metoprolol.  No fever and wbc trend is stable so have relatively low suspicion for infection.  - 1 u prbc as below - cbc, cmp, lactate, bnp, dimer, vgb, coags, cxr, foley, ua/culture, blood cultures - d/c prn morphine for now, may contribute to hypotension - monitor closely  Acute on Chronic Diastolic Congestive Heart Failure. -Patient presented with evidence of bichamber congestive heart failure.  Severe volume overload with anasarca. -BNP on admission was 399.1 -Reviewed patient most recent echocardiogram performed in 08/05/2019, ejection  fraction was normal. -No significant pulmonary hypertension. - soft pressures today, in consultation with cardiology will stop lasix gtt, plan to resume oral lasix tomorrow -Continues to have evidence of volume overload but unclear to what extent LE edema is cardiac-mediated -Monitor electrolytes and renal function,  cr stable -Continue to monitor for signs and symptoms of volume overload -for soft BPs will decrease metoprolol to 50 bid - continue [place] O2 currently on 2 liters, wean as able - start incentive spirometry -Cardiology was consulted for further evaluation  Left leg wound with cellulitis in the setting of DSAP -Patient has a bilateral leg edema appears to be secondary to venous stasis and congestive heart failure.   -Posterior left leg also has ulcer, appears to be infected on admission. Appears to be resolved  -Wound care has been consulted.  have discontinued cefepime given apparent resolution of cellulitis and now diarrhea possibly antibiotic-associated  Bleeding duodenal lesion Acute blood loss anemia EGD 9/16, actively bleeding lesion clipped.  gi pathogen panel negative. hgb stable since clipping and transfusion 1 U transfused 9/16, pt tolerated well Inserted Flexi-Seal given his amount of diarrhea - consulted ID for c diff testing, advised against given afebrile, no leukocytosis - appreciate GI recs. Cont ceftriaxone, continue bid ppi - will transfuse 1 u prbcs today to maintain H above 8 given chf  Chronic Atrial fibrillation with rapid ventricular response. -Patient was taken off of warfarin recently due to concern of skin bleeding.  Patient has some tachycardia, continue beta-blocker. -INR prior to admission was >5  holding on anticoagulation given active bleed  Hypokalemia - 3.3 this am, mg wnl - increase kcl to 30 tid  Type 2 Diabetes Mellitus -Continue to Hold Metformin -Continue sliding scale insulin.  - add lantus 10 units nightly -CBG's high 100s   NASH liver cirrhosis. -Continue to monitor. -Check LFTs in the morning - ID advises 5 days total of ceftriaxone for sbp ppx, will stop tomorrow (9/15>   Chronic kidney disease stage IIIa. - mild cr bump today, monitor  Essential Hypertension. - stable   Morbid Obesity -Estimated body mass index is 43.71  kg/m as calculated from the following:   Height as of this encounter: 5' 8"  (1.727 m).   Weight as of this encounter: 130.4 kg. -Weight Loss and Dietary Counseling given   DVT prophylaxis: TED hose Code Status: FULL CODE  Family Communication: No family present at bedside  Disposition Plan: guarded. Appreciate GI and cardiology consults. Transfer to progressive bed  Status is: Inpatient  Remains inpatient appropriate because:Unsafe d/c plan, IV treatments appropriate due to intensity of illness or inability to take PO and Inpatient level of care appropriate due to severity of illness   Dispo: The patient is from: Home              Anticipated d/c is to: TBD              Anticipated d/c date is: 5 days              Patient currently is not medically stable to d/c.  Consultants:   Cardiology  GI   Procedures:   EGD 9/16  Blood transfusion 9/16  Lines/Tubes - rectal tube  Antimicrobials:  Anti-infectives (From admission, onward)   Start     Dose/Rate Route Frequency Ordered Stop   10/16/19 1400  cefTRIAXone (ROCEPHIN) 1 g in sodium chloride 0.9 % 100 mL IVPB        1 g 200 mL/hr over  30 Minutes Intravenous Every 24 hours 10/15/19 1508 10/17/19 1359   10/12/19 1400  cefTRIAXone (ROCEPHIN) 2 g in sodium chloride 0.9 % 100 mL IVPB  Status:  Discontinued        2 g 200 mL/hr over 30 Minutes Intravenous Every 24 hours 10/12/19 1327 10/15/19 1508   10/11/19 2100  ceFEPIme (MAXIPIME) 2 g in sodium chloride 0.9 % 100 mL IVPB  Status:  Discontinued        2 g 200 mL/hr over 30 Minutes Intravenous Every 8 hours 10/10/19 1853 10/10/19 1859   10/10/19 1900  ceFEPIme (MAXIPIME) 2 g in sodium chloride 0.9 % 100 mL IVPB  Status:  Discontinued        2 g 200 mL/hr over 30 Minutes Intravenous Every 8 hours 10/10/19 1859 10/12/19 1215   10/09/19 0000  ceFEPIme (MAXIPIME) 2 g in sodium chloride 0.9 % 100 mL IVPB  Status:  Discontinued        2 g 200 mL/hr over 30 Minutes Intravenous  Every 8 hours 10/08/19 2346 10/10/19 1853        Subjective: Feels tired. Some back pain. Continued dark watery stools. No emesis. Breathing stable.   Objective: Vitals:   10/16/19 1007 10/16/19 1107 10/16/19 1110 10/16/19 1120  BP: 115/61 (!) 80/33 (!) 77/42 (!) 89/46  Pulse: (!) 103 79 83 88  Resp: 18 17 17    Temp:  98.3 F (36.8 C) 98.3 F (36.8 C)   TempSrc:  Oral Oral   SpO2: 95% 92% 92%   Weight:      Height:        Intake/Output Summary (Last 24 hours) at 10/16/2019 1206 Last data filed at 10/16/2019 1024 Gross per 24 hour  Intake --  Output 100 ml  Net -100 ml   Filed Weights   10/14/19 0424 10/15/19 0354 10/16/19 0537  Weight: 129.5 kg 128.7 kg 130.4 kg   Examination:   Constitutional: WN/WD morbidly obese Caucasian male currently in no acute distress  Eyes: Lids and conjunctivae normal, sclerae anicteric  ENMT: MMM Neck: Appears normal, supple,  Respiratory: Diminished to auscultation bilaterally with rales at bases,  no wheezing, , rhonchi or crackles. Normal respiratory effort and patient is not tachypenic. No accessory muscle use and has unlabored breathing.   Cardiovascular: Irregularly irregular and slightly tachycardic, no murmurs / rubs / gallops.  1-2+ lower extremity edema Abdomen: Soft, non-tender, distended secondary body habitus.  Bowel sounds positive.  GU: Deferred. Musculoskeletal: No clubbing / cyanosis of digits/nails. No joint deformity upper and lower extremities.  Skin: Has multiple skin lesions in the lower extremities and he states that this is disseminated superficial actinic porokeratosis. No induration; Warm and dry. Ulcer left calf. Pale. 2+ edema Neurologic: moves all 4 extremities.  Psychiatric: somnolent, able to answer some simple questions.  Data Reviewed: I have personally reviewed following labs and imaging studies  CBC: Recent Labs  Lab 10/10/19 0701 10/11/19 0522 10/12/19 0427 10/12/19 0427 10/12/19 1310  10/13/19 0629 10/14/19 0657 10/15/19 0608 10/16/19 0439  WBC 9.9   < > 9.0   < > 8.8 12.2* 13.4* 11.2* 12.9*  NEUTROABS 7.5  --  6.2  --  5.9  --   --   --   --   HGB 9.7*   < > 7.9*   < > 7.8* 7.2* 7.5* 7.1* 7.5*  HCT 29.0*   < > 25.2*   < > 25.0* 23.3* 23.4* 22.7* 23.1*  MCV 92.9   < >  96.6   < > 97.7 97.9 95.5 95.4 95.5  PLT 205   < > 199   < > 224 241 221 201 215   < > = values in this interval not displayed.   Basic Metabolic Panel: Recent Labs  Lab 10/10/19 0701 10/11/19 0522 10/12/19 0427 10/12/19 0427 10/12/19 1310 10/13/19 0629 10/14/19 0657 10/15/19 0608 10/16/19 0439  NA 137   < > 137   < > 136 137 136 135 134*  K 3.7   < > 3.4*   < > 3.5 3.4* 3.5 3.2* 3.3*  CL 98   < > 97*   < > 95* 94* 94* 93* 92*  CO2 28   < > 31   < > 31 32 33* 33* 33*  GLUCOSE 145*   < > 146*   < > 136* 139* 151* 175* 205*  BUN 18   < > 29*   < > 34* 41* 42* 37* 31*  CREATININE 1.53*   < > 1.23   < > 1.39* 1.40* 1.57* 1.48* 1.45*  CALCIUM 8.5*   < > 7.8*   < > 7.8* 7.7* 7.7* 7.7* 7.8*  MG 1.7  --  1.6*  --   --   --  2.1 2.0 2.0  PHOS  --   --  1.7*  --   --   --   --   --   --    < > = values in this interval not displayed.   GFR: Estimated Creatinine Clearance: 62.5 mL/min (A) (by C-G formula based on SCr of 1.45 mg/dL (H)). Liver Function Tests: Recent Labs  Lab 10/12/19 1310 10/13/19 0629 10/14/19 0657 10/15/19 0608 10/16/19 0439  AST 40 35 40 42* 41  ALT 19 17 16 15 17   ALKPHOS 78 74 78 84 88  BILITOT 1.9* 1.5* 1.4* 1.2 1.1  PROT 6.4* 6.2* 6.3* 6.3* 6.4*  ALBUMIN 2.1* 2.0* 2.1* 2.1* 2.2*   No results for input(s): LIPASE, AMYLASE in the last 168 hours. No results for input(s): AMMONIA in the last 168 hours. Coagulation Profile: Recent Labs  Lab 10/12/19 1013 10/13/19 0629 10/14/19 0657 10/15/19 0608 10/16/19 0439  INR 1.9* 1.8* 1.7* 1.7* 1.6*   Cardiac Enzymes: No results for input(s): CKTOTAL, CKMB, CKMBINDEX, TROPONINI in the last 168 hours. BNP (last 3  results) No results for input(s): PROBNP in the last 8760 hours. HbA1C: No results for input(s): HGBA1C in the last 72 hours. CBG: Recent Labs  Lab 10/15/19 1145 10/15/19 1718 10/15/19 2106 10/16/19 0801 10/16/19 1203  GLUCAP 183* 189* 214* 197* 180*   Lipid Profile: No results for input(s): CHOL, HDL, LDLCALC, TRIG, CHOLHDL, LDLDIRECT in the last 72 hours. Thyroid Function Tests: No results for input(s): TSH, T4TOTAL, FREET4, T3FREE, THYROIDAB in the last 72 hours. Anemia Panel: No results for input(s): VITAMINB12, FOLATE, FERRITIN, TIBC, IRON, RETICCTPCT in the last 72 hours. Sepsis Labs: No results for input(s): PROCALCITON, LATICACIDVEN in the last 168 hours.  Recent Results (from the past 240 hour(s))  SARS Coronavirus 2 by RT PCR (hospital order, performed in [facility] hospital lab) Nasopharyngeal Nasopharyngeal Swab     Status: None   Collection Time: 10/09/19 12:02 AM   Specimen: Nasopharyngeal Swab  Result Value Ref Range Status   SARS Coronavirus 2 NEGATIVE NEGATIVE Final    Comment: (NOTE) SARS-CoV-2 target nucleic acids are NOT DETECTED.  The SARS-CoV-2 RNA is generally detectable in upper and lower respiratory specimens during the acute phase  of infection. The lowest concentration of SARS-CoV-2 viral copies this assay can detect is 250 copies / mL. A negative result does not preclude SARS-CoV-2 infection and should not be used as the sole basis for treatment or other patient management decisions.  A negative result may occur with improper specimen collection / handling, submission of specimen other than nasopharyngeal swab, presence of viral mutation(s) within the areas targeted by this assay, and inadequate number of viral copies (<250 copies / mL). A negative result must be combined with clinical observations, patient history, and epidemiological information.  Fact Sheet for Patients:   StrictlyIdeas.no  Fact Sheet for  Healthcare Providers: BankingDealers.co.za  This test is not yet approved or  cleared by the [place] FDA and has been authorized for detection and/or diagnosis of SARS-CoV-2 by FDA under an Emergency Use Authorization (EUA).  This EUA will remain in effect (meaning this test can be used) for the duration of the COVID-19 declaration under Section 564(b)(1) of the Act, 21 U.S.C. section 360bbb-3(b)(1), unless the authorization is terminated or revoked sooner.  Performed at [facility], [address] [place], [place] [address]   Gastrointestinal Panel by PCR , Stool     Status: None   Collection Time: 10/11/19  4:07 PM   Specimen: Stool  Result Value Ref Range Status   Campylobacter species NOT DETECTED NOT DETECTED Final   Plesimonas shigelloides NOT DETECTED NOT DETECTED Final   Salmonella species NOT DETECTED NOT DETECTED Final   Yersinia enterocolitica NOT DETECTED NOT DETECTED Final   Vibrio species NOT DETECTED NOT DETECTED Final   Vibrio cholerae NOT DETECTED NOT DETECTED Final   Enteroaggregative E coli (EAEC) NOT DETECTED NOT DETECTED Final   Enteropathogenic E coli (EPEC) NOT DETECTED NOT DETECTED Final   Enterotoxigenic E coli (ETEC) NOT DETECTED NOT DETECTED Final   Shiga like toxin producing E coli (STEC) NOT DETECTED NOT DETECTED Final   Shigella/Enteroinvasive E coli (EIEC) NOT DETECTED NOT DETECTED Final   Cryptosporidium NOT DETECTED NOT DETECTED Final   Cyclospora cayetanensis NOT DETECTED NOT DETECTED Final   Entamoeba histolytica NOT DETECTED NOT DETECTED Final   Giardia lamblia NOT DETECTED NOT DETECTED Final   Adenovirus F40/41 NOT DETECTED NOT DETECTED Final   Astrovirus NOT DETECTED NOT DETECTED Final   Norovirus GI/GII NOT DETECTED NOT DETECTED Final   Rotavirus A NOT DETECTED NOT DETECTED Final   Sapovirus (I, II, IV, and V) NOT DETECTED NOT DETECTED Final    Comment: Performed at [facility], [address] [place], [place] [address]    RN Pressure Injury Documentation:     Estimated body mass index is 43.71 kg/m as calculated from the following:   Height as of this encounter: 5' 8"  (1.727 m).   Weight as of this encounter: 130.4 kg.  Malnutrition Type:      Malnutrition Characteristics:      Nutrition Interventions:    Radiology Studies: No results found.   Scheduled Meds:  sodium chloride   Intravenous Once   sodium chloride   Intravenous Once   hydrocerin   Topical Daily   insulin aspart  0-15 Units Subcutaneous TID WC   insulin glargine  10 Units Subcutaneous QHS   metoprolol tartrate  50 mg Oral BID   pantoprazole (PROTONIX) IV  40 mg Intravenous Q12H   potassium citrate  30 mEq Oral TID WC   pravastatin  20 mg Oral q1800   sodium chloride flush  3 mL Intravenous Q12H  Continuous Infusions:  sodium chloride     cefTRIAXone (ROCEPHIN)  IV      LOS: 8 days   Desma Maxim, MD [facility] PAGER is on AMION  If 7PM-7AM, please contact night-coverage [email or web address]

## 2019-10-16 NOTE — Plan of Care (Signed)
  Problem: Clinical Measurements: Goal: Will remain free from infection Outcome: Progressing Goal: Respiratory complications will improve Outcome: Progressing Goal: Cardiovascular complication will be avoided Outcome: Progressing

## 2019-10-16 NOTE — Plan of Care (Signed)

## 2019-10-16 NOTE — Progress Notes (Signed)
Blood pressures soft last SBP 89, pt asymptomatic. Patient also continues to have tarry looking stools. Dr. Si Raider updated. Lasix gtt stopped per order.

## 2019-10-16 NOTE — Progress Notes (Addendum)
Patient Name: Christian Berg Date of Encounter: 10/16/2019  Hospital Problem List     Principal Problem:   (HFpEF) heart failure with preserved ejection fraction (HCC) Active Problems:   Diabetes mellitus ([place])   Benign essential HTN   Benign prostatic hyperplasia with urinary obstruction   Chronic atrial fibrillation (HCC)   Morbid (severe) obesity due to excess calories (HCC)   NASH (nonalcoholic steatohepatitis)   Bilateral carotid artery stenosis   CKD stage 3 due to type 2 diabetes mellitus (HCC)   Acute exacerbation of CHF (congestive heart failure) (HCC)   Leg wound, left, initial encounter    Patient Profile     69 year old male with history of diabetes, chronic atrial fibrillation NASH, chronic kidney disease admitted with shortness of breath and heart failure.  Subjective   Somewhat less sob, blood pressure somewhat soft  Inpatient Medications    . sodium chloride   Intravenous Once  . hydrocerin   Topical Daily  . insulin aspart  0-15 Units Subcutaneous TID WC  . metoprolol tartrate  100 mg Oral BID  . pantoprazole (PROTONIX) IV  40 mg Intravenous Q12H  . potassium citrate  20 mEq Oral TID WC  . pravastatin  20 mg Oral q1800  . sodium chloride flush  3 mL Intravenous Q12H    Vital Signs    Vitals:   10/16/19 0359 10/16/19 0537 10/16/19 0801 10/16/19 1007  BP: (!) 109/58  (!) 91/43 115/61  Pulse: (!) 110  96 (!) 103  Resp: 18  17 18   Temp:   98.9 F (37.2 C)   TempSrc:   Oral   SpO2: 95%  90% 95%  Weight:  130.4 kg    Height:       No intake or output data in the 24 hours ending 10/16/19 1010 Filed Weights   10/14/19 0424 10/15/19 0354 10/16/19 0537  Weight: 129.5 kg 128.7 kg 130.4 kg    Physical Exam    GEN: Well nourished, well developed, in no acute distress.  HEENT: normal.  Neck: Supple, no JVD, carotid bruits, or masses. Cardiac: irregular,tachycardic; 4+ lower extremity edema Respiratory:  Respirations regular and unlabored, clear  to auscultation bilaterally. GI: Soft, nontender, nondistended, BS + x 4. MS: no deformity or atrophy. Skin: warm and dry, no rash. Neuro:  Strength and sensation are intact. Psych: Normal affect.  Labs    CBC Recent Labs    10/15/19 0608 10/16/19 0439  WBC 11.2* 12.9*  HGB 7.1* 7.5*  HCT 22.7* 23.1*  MCV 95.4 95.5  PLT 201 735   Basic Metabolic Panel Recent Labs    10/15/19 0608 10/16/19 0439  NA 135 134*  K 3.2* 3.3*  CL 93* 92*  CO2 33* 33*  GLUCOSE 175* 205*  BUN 37* 31*  CREATININE 1.48* 1.45*  CALCIUM 7.7* 7.8*  MG 2.0 2.0   Liver Function Tests Recent Labs    10/15/19 0608 10/16/19 0439  AST 42* 41  ALT 15 17  ALKPHOS 84 88  BILITOT 1.2 1.1  PROT 6.3* 6.4*  ALBUMIN 2.1* 2.2*   No results for input(s): LIPASE, AMYLASE in the last 72 hours. Cardiac Enzymes No results for input(s): CKTOTAL, CKMB, CKMBINDEX, TROPONINI in the last 72 hours. BNP No results for input(s): BNP in the last 72 hours. D-Dimer No results for input(s): DDIMER in the last 72 hours. Hemoglobin A1C No results for input(s): HGBA1C in the last 72 hours. Fasting Lipid Panel No results for input(s): CHOL, HDL,  LDLCALC, TRIG, CHOLHDL, LDLDIRECT in the last 72 hours. Thyroid Function Tests No results for input(s): TSH, T4TOTAL, T3FREE, THYROIDAB in the last 72 hours.  Invalid input(s): FREET3  Telemetry    afib  ECG    afib  Radiology    DG Chest 1 View  Result Date: 10/12/2019 CLINICAL DATA:  Shortness of breath. EXAM: CHEST  1 VIEW COMPARISON:  October 08, 2019 FINDINGS: There is a dual lead AICD. Mild diffusely increased interstitial lung markings are seen the which is predominant stable in severity when compared to the prior exam. Mild to moderate severity prominence of the perihilar pulmonary vasculature is noted which represents a new finding. There is no evidence of a pleural effusion or pneumothorax. Stable moderate to marked severity enlargement of the cardiac  silhouette is seen. A radiopaque fixation plate and multiple fixation screws are seen within the proximal left humerus. IMPRESSION: 1. Stable cardiomegaly with mild to moderate severity pulmonary vascular congestion. Electronically Signed   By: Virgina Norfolk M.D.   On: 10/12/2019 02:13   DG Chest 2 View  Result Date: 10/08/2019 CLINICAL DATA:  Shortness of breath, open wounds to the posterior left lower leg and anterior right lower leg, history of asthma EXAM: CHEST - 2 VIEW COMPARISON:  Radiograph 08/08/2019, CT 09/15/2015 FINDINGS: Diffuse hazy basilar predominant interstitial opacities with some fissural thickening as well. More patchy retrocardiac infrahilar opacities in the lung bases. No pneumothorax or visible effusion. Prominent cardiac silhouette compatible with cardiomegaly seen on comparison studies. Pacer pack overlies the left chest wall with leads at the cardiac apex and right atrium in similar positioning to prior. Remaining cardiomediastinal contours are unremarkable. No acute osseous or soft tissue abnormality. Degenerative changes are present in the imaged spine and shoulders. Prior left humeral ORIF is noted. IMPRESSION: 1. Features of CHF/volume overload with cardiomegaly and interstitial opacities suggesting edema. 2. More patchy retrocardiac and infrahilar opacities in the lung bases may reflect developing alveolar edema, atelectasis or airspace disease. Electronically Signed   By: Lovena Le M.D.   On: 10/08/2019 17:36   DG Tibia/Fibula Left  Result Date: 10/08/2019 CLINICAL DATA:  Open wound to posterior left lower leg. EXAM: LEFT TIBIA AND FIBULA - 2 VIEW COMPARISON:  None. FINDINGS: There is no evidence of fracture or other focal bone lesions. Mild diffuse soft tissue swelling is seen throughout the left calf with moderate severity soft tissue swelling also noted along the anterior aspect of the left tibia. No soft tissue air is seen. IMPRESSION: 1. Soft tissue swelling which  may be secondary to diffuse cellulitis, without evidence of an acute osseous abnormality. Electronically Signed   By: Virgina Norfolk M.D.   On: 10/08/2019 19:49    Assessment & Plan      Principal Problem: (HFpEF) heart failure with preserved ejection fraction (HCC) Active Problems: Diabetes mellitus ([place]) Benign essential HTN Benign prostatic hyperplasia with urinary obstruction Chronic atrial fibrillation (HCC) Morbid (severe) obesity due to excess calories (HCC) NASH (nonalcoholic steatohepatitis) Bilateral carotid artery stenosis CKD stage 3 due to type 2 diabetes mellitus (HCC) Acute exacerbation of CHF (congestive heart failure) (HCC) Leg wound, left, initial encounter   1.Acute on chronic diastolic CHF,EF 02-54%; presenting with weight gain, progressive exertional dyspnea, peripheral edema, and orthopnea, with anasarca, currently on Lasix drip. Will  stop the lasix drip and resume po lasix when pressure improves..   Has diuresed 4.4 L since admission.  Creatinine slowly  improved from yesterday down to 1.45.  Still somewhat  hypokalemic at 3.3.v Continue repleation 2.Chronic atrial fibrillation, on metoprolol tartrate 100 mg twice daily for rate control, warfarin on hold due tobleeding, and now hemoccult possitve.Elevated ventricular rate to 110s-120s bpm ,RVR likely compensatorysecondary to GI bleed and anemia,patient asymptomatic. 3.Anemia,hemoglobin7.5, underwent colonoscopy which revealed bleeding visible vessel second portion of duodenum underwent hemostatic clips 5. NASH, GI following  Recommendations  1.Stopped lasix drip and will hold diuresis following pressure..Will reduce the metoprolol to 50 mg bid 2.Continue diuresis following hemodynamics and electrolytes.  Hypokalemic today will need continued oral replacement. 3.Carefully monitor renal status 4.Monitor daily weights andI&O's 5.Defer further cardiac diagnostics at  this time 6.Follow-up with Dr. Nehemiah Massed after discharge  Signed, Javier Docker. Caryn Gienger MD 10/16/2019, 10:10 AM  Pager: [phone]

## 2019-10-16 NOTE — Progress Notes (Signed)
CRITICAL VALUE ALERT  Critical Value:  Lactic acid 2.4  Date & Time Notied:  10/16/19 1932  Provider Notified: Stark Klein, NP  Passed on to oncoming nurse.

## 2019-10-17 ENCOUNTER — Encounter: Payer: Self-pay | Admitting: Internal Medicine

## 2019-10-17 ENCOUNTER — Inpatient Hospital Stay: Payer: Medicare HMO

## 2019-10-17 LAB — COMPREHENSIVE METABOLIC PANEL
ALT: 15 U/L (ref 0–44)
AST: 34 U/L (ref 15–41)
Albumin: 2.2 g/dL — ABNORMAL LOW (ref 3.5–5.0)
Alkaline Phosphatase: 85 U/L (ref 38–126)
Anion gap: 9 (ref 5–15)
BUN: 30 mg/dL — ABNORMAL HIGH (ref 8–23)
CO2: 33 mmol/L — ABNORMAL HIGH (ref 22–32)
Calcium: 8 mg/dL — ABNORMAL LOW (ref 8.9–10.3)
Chloride: 91 mmol/L — ABNORMAL LOW (ref 98–111)
Creatinine, Ser: 1.45 mg/dL — ABNORMAL HIGH (ref 0.61–1.24)
GFR calc Af Amer: 56 mL/min — ABNORMAL LOW (ref >60)
GFR calc non Af Amer: 48 mL/min — ABNORMAL LOW (ref >60)
Glucose, Bld: 192 mg/dL — ABNORMAL HIGH (ref 70–99)
Potassium: 3.4 mmol/L — ABNORMAL LOW (ref 3.5–5.1)
Sodium: 133 mmol/L — ABNORMAL LOW (ref 135–145)
Total Bilirubin: 1.5 mg/dL — ABNORMAL HIGH (ref 0.3–1.2)
Total Protein: 6.4 g/dL — ABNORMAL LOW (ref 6.5–8.1)

## 2019-10-17 LAB — GLUCOSE, CAPILLARY
Glucose-Capillary: 183 mg/dL — ABNORMAL HIGH (ref 70–99)
Glucose-Capillary: 200 mg/dL — ABNORMAL HIGH (ref 70–99)
Glucose-Capillary: 192 mg/dL — ABNORMAL HIGH (ref 70–99)
Glucose-Capillary: 162 mg/dL — ABNORMAL HIGH (ref 70–99)

## 2019-10-17 LAB — URINE CULTURE: Culture: NO GROWTH

## 2019-10-17 LAB — TYPE AND SCREEN
ABO/RH(D): O POS
Antibody Screen: NEGATIVE
Unit division: 0
Unit division: 0

## 2019-10-17 LAB — BPAM RBC
Blood Product Expiration Date: 202110112359
Blood Product Expiration Date: 202110182359
ISSUE DATE / TIME: 202109161800
ISSUE DATE / TIME: 202109191414
Unit Type and Rh: 5100
Unit Type and Rh: 5100

## 2019-10-17 LAB — BILIRUBIN, FRACTIONATED(TOT/DIR/INDIR)
Bilirubin, Direct: 0.5 mg/dL — ABNORMAL HIGH (ref 0.0–0.2)
Indirect Bilirubin: 0.9 mg/dL (ref 0.3–0.9)
Total Bilirubin: 1.4 mg/dL — ABNORMAL HIGH (ref 0.3–1.2)

## 2019-10-17 LAB — BRAIN NATRIURETIC PEPTIDE: B Natriuretic Peptide: 445.1 pg/mL — ABNORMAL HIGH (ref 0.0–100.0)

## 2019-10-17 LAB — C DIFFICILE (CDIFF) QUICK SCRN (NO PCR REFLEX)
C Diff antigen: POSITIVE — AB
C Diff interpretation: DETECTED
C Diff toxin: POSITIVE — AB

## 2019-10-17 LAB — CBC
HCT: 26.8 % — ABNORMAL LOW (ref 39.0–52.0)
Hemoglobin: 9 g/dL — ABNORMAL LOW (ref 13.0–17.0)
MCH: 31.5 pg (ref 26.0–34.0)
MCHC: 33.6 g/dL (ref 30.0–36.0)
MCV: 93.7 fL (ref 80.0–100.0)
Platelets: 225 10*3/uL (ref 150–400)
RBC: 2.86 MIL/uL — ABNORMAL LOW (ref 4.22–5.81)
RDW: 21 % — ABNORMAL HIGH (ref 11.5–15.5)
WBC: 14.9 10*3/uL — ABNORMAL HIGH (ref 4.0–10.5)
nRBC: 0.1 % (ref 0.0–0.2)

## 2019-10-17 LAB — PROTIME-INR
INR: 1.7 — ABNORMAL HIGH (ref 0.8–1.2)
Prothrombin Time: 19.1 seconds — ABNORMAL HIGH (ref 11.4–15.2)

## 2019-10-17 LAB — MAGNESIUM: Magnesium: 2 mg/dL (ref 1.7–2.4)

## 2019-10-17 LAB — LIPASE, BLOOD: Lipase: 38 U/L (ref 11–51)

## 2019-10-17 MED ORDER — IOHEXOL 9 MG/ML PO SOLN
500.0000 mL | ORAL | Status: AC
  Administered 2019-10-17: 500 mL via ORAL

## 2019-10-17 MED ORDER — VANCOMYCIN 50 MG/ML ORAL SOLUTION
125.0000 mg | Freq: Four times a day (QID) | ORAL | Status: DC
  Administered 2019-10-17 – 2019-10-23 (×22): 125 mg via ORAL
  Filled 2019-10-17 (×26): qty 2.5

## 2019-10-17 MED ORDER — POTASSIUM CITRATE ER 10 MEQ (1080 MG) PO TBCR
40.0000 meq | EXTENDED_RELEASE_TABLET | Freq: Three times a day (TID) | ORAL | Status: DC
  Administered 2019-10-17: 40 meq via ORAL
  Filled 2019-10-17 (×2): qty 4

## 2019-10-17 MED ORDER — IOHEXOL 300 MG/ML  SOLN: 100.0000 mL | Freq: Once | INTRAMUSCULAR | Status: DC | PRN

## 2019-10-17 MED ORDER — LOPERAMIDE HCL 2 MG PO CAPS
4.0000 mg | ORAL_CAPSULE | Freq: Four times a day (QID) | ORAL | Status: DC | PRN
  Administered 2019-10-17: 4 mg via ORAL
  Filled 2019-10-17: qty 2

## 2019-10-17 MED ORDER — FUROSEMIDE 10 MG/ML IJ SOLN
40.0000 mg | Freq: Two times a day (BID) | INTRAMUSCULAR | Status: DC
  Administered 2019-10-17: 40 mg via INTRAVENOUS
  Filled 2019-10-17: qty 4

## 2019-10-17 MED ORDER — FUROSEMIDE 10 MG/ML IJ SOLN: 40.0000 mg | Freq: Every day | INTRAMUSCULAR | Status: DC

## 2019-10-17 MED ORDER — POTASSIUM CHLORIDE CRYS ER 20 MEQ PO TBCR
40.0000 meq | EXTENDED_RELEASE_TABLET | Freq: Three times a day (TID) | ORAL | Status: DC
  Administered 2019-10-17 – 2019-10-20 (×8): 40 meq via ORAL
  Filled 2019-10-17 (×8): qty 2

## 2019-10-17 MED ORDER — IOHEXOL 350 MG/ML SOLN
75.0000 mL | Freq: Once | INTRAVENOUS | Status: AC | PRN
  Administered 2019-10-17: 75 mL via INTRAVENOUS

## 2019-10-17 NOTE — Progress Notes (Signed)
This pm pt complained of upper abdominal pain and increased stool output. Upper abdomen was mildly tender. Ct ordered, showing possible colitis. Discussed w/ ID, advised c diff testing and empiric oral vancomycin (ordered). Will f/u c diff result

## 2019-10-17 NOTE — Progress Notes (Signed)
PT Cancellation Note  Patient Details Name: Christian Berg MRN: [id number] DOB: 09-06-1950   Cancelled Treatment:    Reason Eval/Treat Not Completed: Other (comment)   Pt on bedpan on first arrival.  Returned after sufficient time and he is working on drink for testing later.  Declined therapy at this time.  Will continue as appropriate.   [name] 10/17/2019, 2:23 PM

## 2019-10-17 NOTE — Care Management Important Message (Signed)
Important Message  Patient Details  Name: Christian Berg MRN: [id number] Date of Birth: 15-Sep-1950   Medicare Important Message Given:  Yes     [name] 10/17/2019, 12:16 PM

## 2019-10-17 NOTE — Progress Notes (Signed)
Patient Name: Christian Berg Date of Encounter: 10/17/2019  Hospital Problem List     Principal Problem:   (HFpEF) heart failure with preserved ejection fraction (HCC) Active Problems:   Diabetes mellitus ([place])   Benign essential HTN   Benign prostatic hyperplasia with urinary obstruction   Chronic atrial fibrillation (HCC)   Morbid (severe) obesity due to excess calories (HCC)   NASH (nonalcoholic steatohepatitis)   Bilateral carotid artery stenosis   CKD stage 3 due to type 2 diabetes mellitus (HCC)   Acute exacerbation of CHF (congestive heart failure) (HCC)   Leg wound, left, initial encounter    Patient Profile     69 year old male with history of diabetes, chronic atrial fibrillation NASH, chronic kidney disease admitted with shortness of breath and heart failure.  Subjective   Denies chest pain but still sob.  Alert and oriented.   Inpatient Medications     sodium chloride   Intravenous Once   Chlorhexidine Gluconate Cloth  6 each Topical Daily   hydrocerin   Topical Daily   insulin aspart  0-15 Units Subcutaneous TID WC   insulin glargine  10 Units Subcutaneous QHS   metoprolol tartrate  50 mg Oral BID   pantoprazole (PROTONIX) IV  40 mg Intravenous Q12H   potassium citrate  30 mEq Oral TID WC   pravastatin  20 mg Oral q1800   sodium chloride flush  3 mL Intravenous Q12H    Vital Signs    Vitals:   10/17/19 0000 10/17/19 0142 10/17/19 0420 10/17/19 0746  BP:  (!) 119/48 125/63 119/69  Pulse:    96  Resp: 16 20 19 20   Temp:  98.8 F (37.1 C)  98.7 F (37.1 C)  TempSrc:  Oral  Oral  SpO2:  96% 98% 100%  Weight:      Height:        Intake/Output Summary (Last 24 hours) at 10/17/2019 0834 Last data filed at 10/17/2019 0630 Gross per 24 hour  Intake 1074 ml  Output 1100 ml  Net -26 ml   Filed Weights   10/14/19 0424 10/15/19 0354 10/16/19 0537  Weight: 129.5 kg 128.7 kg 130.4 kg    Physical Exam    GEN: Well nourished, well  developed, in no acute distress.  HEENT: normal.  Neck: Supple, no JVD, carotid bruits, or masses. Cardiac: irr, irr,  Respiratory:  Respirations regular and unlabored, scattered rales bilaterally, 4+ lower extremety edema GI: Soft, nontender, nondistended, BS + x 4. MS: no deformity or atrophy. Skin: warm and dry, no rash. Neuro:  Strength and sensation are intact. Psych: Normal affect.  Labs    CBC Recent Labs    10/16/19 1843 10/16/19 1843 10/16/19 2301 10/17/19 0614  WBC 8.0  --   --  14.9*  HGB 13.8   < > 8.4* 9.0*  HCT 42.9   < > 25.9* 26.8*  MCV 95.8  --   --  93.7  PLT 154  --   --  225   < > = values in this interval not displayed.   Basic Metabolic Panel Recent Labs    10/16/19 0439 10/16/19 0439 10/16/19 1842 10/17/19 0614  NA 134*   < > 131* 133*  K 3.3*   < > 3.5 3.4*  CL 92*   < > 88* 91*  CO2 33*   < > 32 33*  GLUCOSE 205*   < > 238* 192*  BUN 31*   < > 32* 30*  CREATININE 1.45*   < > 1.46* 1.45*  CALCIUM 7.8*   < > 7.9* 8.0*  MG 2.0  --   --  2.0   < > = values in this interval not displayed.   Liver Function Tests Recent Labs    10/16/19 1842 10/17/19 0614  AST 41 34  ALT 16 15  ALKPHOS 97 85  BILITOT 1.2 1.5*  PROT 6.9 6.4*  ALBUMIN 2.3* 2.2*   No results for input(s): LIPASE, AMYLASE in the last 72 hours. Cardiac Enzymes No results for input(s): CKTOTAL, CKMB, CKMBINDEX, TROPONINI in the last 72 hours. BNP Recent Labs    10/16/19 1843  BNP 451.9*   D-Dimer No results for input(s): DDIMER in the last 72 hours. Hemoglobin A1C No results for input(s): HGBA1C in the last 72 hours. Fasting Lipid Panel No results for input(s): CHOL, HDL, LDLCALC, TRIG, CHOLHDL, LDLDIRECT in the last 72 hours. Thyroid Function Tests No results for input(s): TSH, T4TOTAL, T3FREE, THYROIDAB in the last 72 hours.  Invalid input(s): FREET3  Telemetry    afib with variable rate  ECG    afib  Radiology    DG Chest 1 View  Result Date:  10/12/2019 CLINICAL DATA:  Shortness of breath. EXAM: CHEST  1 VIEW COMPARISON:  October 08, 2019 FINDINGS: There is a dual lead AICD. Mild diffusely increased interstitial lung markings are seen the which is predominant stable in severity when compared to the prior exam. Mild to moderate severity prominence of the perihilar pulmonary vasculature is noted which represents a new finding. There is no evidence of a pleural effusion or pneumothorax. Stable moderate to marked severity enlargement of the cardiac silhouette is seen. A radiopaque fixation plate and multiple fixation screws are seen within the proximal left humerus. IMPRESSION: 1. Stable cardiomegaly with mild to moderate severity pulmonary vascular congestion. Electronically Signed   By: Virgina Norfolk M.D.   On: 10/12/2019 02:13   DG Chest 2 View  Result Date: 10/08/2019 CLINICAL DATA:  Shortness of breath, open wounds to the posterior left lower leg and anterior right lower leg, history of asthma EXAM: CHEST - 2 VIEW COMPARISON:  Radiograph 08/08/2019, CT 09/15/2015 FINDINGS: Diffuse hazy basilar predominant interstitial opacities with some fissural thickening as well. More patchy retrocardiac infrahilar opacities in the lung bases. No pneumothorax or visible effusion. Prominent cardiac silhouette compatible with cardiomegaly seen on comparison studies. Pacer pack overlies the left chest wall with leads at the cardiac apex and right atrium in similar positioning to prior. Remaining cardiomediastinal contours are unremarkable. No acute osseous or soft tissue abnormality. Degenerative changes are present in the imaged spine and shoulders. Prior left humeral ORIF is noted. IMPRESSION: 1. Features of CHF/volume overload with cardiomegaly and interstitial opacities suggesting edema. 2. More patchy retrocardiac and infrahilar opacities in the lung bases may reflect developing alveolar edema, atelectasis or airspace disease. Electronically Signed   By:  Lovena Le M.D.   On: 10/08/2019 17:36   DG Tibia/Fibula Left  Result Date: 10/08/2019 CLINICAL DATA:  Open wound to posterior left lower leg. EXAM: LEFT TIBIA AND FIBULA - 2 VIEW COMPARISON:  None. FINDINGS: There is no evidence of fracture or other focal bone lesions. Mild diffuse soft tissue swelling is seen throughout the left calf with moderate severity soft tissue swelling also noted along the anterior aspect of the left tibia. No soft tissue air is seen. IMPRESSION: 1. Soft tissue swelling which may be secondary to diffuse cellulitis, without evidence of an acute  osseous abnormality. Electronically Signed   By: Virgina Norfolk M.D.   On: 10/08/2019 19:49   DG Chest Port 1 View  Result Date: 10/16/2019 CLINICAL DATA:  Hypertension, CHF EXAM: PORTABLE CHEST 1 VIEW COMPARISON:  10/12/2019 FINDINGS: Mild bilateral interstitial thickening. No pleural effusion or pneumothorax. No focal consolidation. Stable cardiomegaly. Dual lead cardiac pacemaker. No acute osseous abnormality. IMPRESSION: Cardiomegaly with mild pulmonary vascular congestion. Electronically Signed   By: Kathreen Devoid   On: 10/16/2019 15:37    Assessment & Plan    Principal Problem: (HFpEF) heart failure with preserved ejection fraction (HCC) Active Problems: Diabetes mellitus ([place]) Benign essential HTN Benign prostatic hyperplasia with urinary obstruction Chronic atrial fibrillation (HCC) Morbid (severe) obesity due to excess calories (HCC) NASH (nonalcoholic steatohepatitis) Bilateral carotid artery stenosis CKD stage 3 due to type 2 diabetes mellitus ([place]) Acute exacerbation of CHF (congestive heart failure) (HCC) Leg wound, left, initial encounter   1.Acute on chronic diastolic CHF,EF 56-97%; presenting with weight gain, progressive exertional dyspnea, peripheral edema, and orthopnea, with anasarc. Off of the lasix drip and will resume iv lasix once daily following hemodynamics and I/o.   Chest x-ray showed cardiomegaly with mild pulmonary vascular congestion. Has diuresed 4.5 L since admission. Creatinine slowly  improved from yesterday down to 1.45. Still somewhat hypokalemic at 3.3.v Continue repleation 2.Chronic atrial fibrillation, on metoprolol tartrate 50 mg twice daily for rate control, warfarin on hold due tobleeding.Elevated ventricular rate to 110s-120s bpm ,RVR likely compensatorysecondary to GI bleed and anemia,patient asymptomatic. 3.Anemia,hemoglobin7.5, underwent colonoscopy which revealed bleeding visible vessel second portion of duodenum underwent hemostatic clips 5. NASH, GI following  Recommendations  1.Will resume iv lasix daily..Will continue with metoprolol  50 mg bid 2.Continue diuresisfollowing hemodynamics and electrolytes. Hypokalemic today will need continued oral replacement. 3.Carefully monitor renal status 4.Monitor daily weights andI&O's 5.Defer further cardiac diagnostics at this time 6.Follow-up with Dr. Nehemiah Massed after discharge  Signed, Javier Docker. Kansas Spainhower MD 10/17/2019, 8:34 AM  Pager: [phone]

## 2019-10-17 NOTE — Progress Notes (Addendum)
[name]  PROGRESS NOTE    SY SAINTJEAN  ZYY:[id number] DOB: 1950/08/13 DOA: 10/08/2019 PCP: Birdie Sons, MD   Brief Narrative: HPI per Dr. Gala Romney on 10/09/19 Christian Berg is a 69 y.o. male with medical history significant of diabetes, hypertension, diastolic dysfunction CHF, disseminated superficial actinic porokeratosis, degenerative disc disease, recent bleeding episodes, atrial fibrillation and peripheral vascular disease who presented to the ER with left lower extremity ulcer.  He already has a right lower extremity ulcer.  He came in for wound check.  Patient has noted increasing weight about 25 pounds more in the last few weeks.  He is taking diuretics at home.  He also has noted increasing shortness of breath.  He has bruising easily and was told to stop his Coumadin about a week ago.  Patient was seen in the ER in addition to his leg wounds was found to be having episode of anasarca.  Appears he has acute exacerbation of his diastolic CHF so he is being admitted to the hospital for further evaluation and treatment..  ED Course: Temperature is 99.1 blood pressure 130/47 pulse 140 respirate 26 oxygen sats 94% on room air.  White count 9.0 hemoglobin 9.3 and platelet 235.  Chemistry appears to be within normal except for creatinine 1.38 and calcium 8.4.  Glucose 145.  COVID-19 screen is negative.  Chest x-ray showed evidence of mild fluid overload.  X-ray of the tibia-fibula shows no evidence of osseous abnormality.  Patient being admitted with acute exacerbation of CHF and also possible infected wound.  **Interim History  Is started on IV Lasix and this was changed to Lasix drip.  Wound care was consulted for the left lower extremity and cefepime was started for infection.  He started having significant amount of diarrhea but clinically do not suspect C. difficile unlikely this is antibiotic mediated diarrhea.  He is currently afebrile and has no leukocytosis.  Still remains very  swollen so cardiology was consulted for further evaluation as he remains on Lasix drip. On 9/15 worsening diarrhea, fecal occult blood psoitive, and down trendinghemoglobin. Gi consulted, EGD on 9/16 showed actively bleeding duodenal lesion that was clipped. H to low 7s, transfused one unit. On 9/17 H stable in 7s, no emesis. On 9/18 remains on IV lasix drip, hgb stable low 7s. 9/19 low BPs. On 9/19 low bps with maps in the low 60s. Stopped lasix gtt and IV lasix, hgb stable in 7s, transfused one unit. Foley placed, pan-cultured. 9/20 hemodynamics improved, mentating clearly.  Assessment & Plan:   Principal Problem:   (HFpEF) heart failure with preserved ejection fraction (HCC) Active Problems:   Diabetes mellitus ([place])   Benign essential HTN   Benign prostatic hyperplasia with urinary obstruction   Chronic atrial fibrillation (HCC)   Morbid (severe) obesity due to excess calories (HCC)   NASH (nonalcoholic steatohepatitis)   Bilateral carotid artery stenosis   CKD stage 3 due to type 2 diabetes mellitus (HCC)   Acute exacerbation of CHF (congestive heart failure) (HCC)   Leg wound, left, initial encounter  Abdominal pain Reports upper abdominal pain worsening for the past few days. No vomiting but has decreased appetite. Not worse in the RUQ. Thinks having more gas and stool output. No fevers. Direct bili is mildly elevated - will check lipase and CT to further evaluate  Hypotension Afternoon/evening of 9/19 Think most likely response to IV lasix with diarrhea contributing. Stopped lasix and reduced metoprolol to 50.  BP wnl today -  1 u prbc as below - cbc, cmp, lactate, bnp, dimer, vgb, coags, cxr, foley, ua/culture, blood cultures. Unrevealing to date - d/c prn morphine for now, may contribute to hypotension - monitor closely  Acute on Chronic Diastolic Congestive Heart Failure. -Patient presented with evidence of bichamber congestive heart failure.  Severe volume overload with  anasarca. -BNP on admission was 399.1 -Reviewed patient most recent echocardiogram performed in 08/05/2019, ejection fraction was normal. -No significant pulmonary hypertension. - Off O2 - soft pressures today, in consultation with cardiology will stop lasix gtt, plan to resume oral lasix tomorrow -Continues to have evidence of volume overload but unclear to what extent LE edema is cardiac-mediated - lasix per cardiology, today's consult pending - cont incentive spirometry  Left leg wound with cellulitis in the setting of DSAP Resolved  Bleeding duodenal lesion Acute blood loss anemia EGD 9/16, actively bleeding lesion clipped.  gi pathogen panel negative. hgb stable since clipping and transfusion 1 U transfused 9/16, pt tolerated well. Another unit transfused 9/19 - consulted ID for c diff testing, advised against given afebrile, no leukocytosis - appreciate GI recs. Now s/p 5 days ceftriaxone for sbp ppx - H/H stable, will transfuse to keep H above 8 - ordering prn loperamide for ongoing loose stools  Chronic Atrial fibrillation with rapid ventricular response. -Patient was taken off of warfarin recently due to concern of skin bleeding.  Patient has some tachycardia, continue beta-blocker. -INR prior to admission was >5, now < 2 - holding on anticoagulation given active bleed  Hypokalemia - 3.4 this am, mg wnl - increase kcl to 40 tid  Type 2 Diabetes Mellitus -Continue to Hold Metformin -Continue sliding scale insulin. CBGs upper 100s - cont lantus 10 units nightly   NASH liver cirrhosis. -Continue to monitor. - LFTs stable. T billi mildly elevated, will check fractionated. No RUQ pain/tenderness, no n/v  Chronic kidney disease stage IIIa. Stable  Essential Hypertension. Stable   Morbid Obesity -Estimated body mass index is 43.71 kg/m as calculated from the following:   Height as of this encounter: 5' 8"  (1.727 m).   Weight as of this encounter: 130.4  kg. -Weight Loss and Dietary Counseling given   DVT prophylaxis: TED hose Code Status: FULL CODE  Family Communication: wife updated extensively 9/19 Disposition Plan: SNF  Status is: Inpatient  Remains inpatient appropriate because:Unsafe d/c plan, IV treatments appropriate due to intensity of illness or inability to take PO and Inpatient level of care appropriate due to severity of illness   Dispo: The patient is from: Home              Anticipated d/c is to: TBD              Anticipated d/c date is: 5 days              Patient currently is not medically stable to d/c.  Consultants:   Cardiology  GI   Procedures:   EGD 9/16  Blood transfusion 9/16, 9/18  Lines/Tubes - Foley placed 9/19  Antimicrobials:  Anti-infectives (From admission, onward)   Start     Dose/Rate Route Frequency Ordered Stop   10/16/19 1400  cefTRIAXone (ROCEPHIN) 1 g in sodium chloride 0.9 % 100 mL IVPB        1 g 200 mL/hr over 30 Minutes Intravenous Every 24 hours 10/15/19 1508 10/17/19 0850   10/12/19 1400  cefTRIAXone (ROCEPHIN) 2 g in sodium chloride 0.9 % 100 mL IVPB  Status:  Discontinued  2 g 200 mL/hr over 30 Minutes Intravenous Every 24 hours 10/12/19 1327 10/15/19 1508   10/11/19 2100  ceFEPIme (MAXIPIME) 2 g in sodium chloride 0.9 % 100 mL IVPB  Status:  Discontinued        2 g 200 mL/hr over 30 Minutes Intravenous Every 8 hours 10/10/19 1853 10/10/19 1859   10/10/19 1900  ceFEPIme (MAXIPIME) 2 g in sodium chloride 0.9 % 100 mL IVPB  Status:  Discontinued        2 g 200 mL/hr over 30 Minutes Intravenous Every 8 hours 10/10/19 1859 10/12/19 1215   10/09/19 0000  ceFEPIme (MAXIPIME) 2 g in sodium chloride 0.9 % 100 mL IVPB  Status:  Discontinued        2 g 200 mL/hr over 30 Minutes Intravenous Every 8 hours 10/08/19 2346 10/10/19 1853        Subjective: Feels tired. Some back pain. Continued dark watery stools. No emesis. Breathing stable.   Objective: Vitals:    10/17/19 0000 10/17/19 0142 10/17/19 0420 10/17/19 0746  BP:  (!) 119/48 125/63 119/69  Pulse:    96  Resp: 16 20 19 20   Temp:  98.8 F (37.1 C)  98.7 F (37.1 C)  TempSrc:  Oral  Oral  SpO2:  96% 98% 100%  Weight:      Height:        Intake/Output Summary (Last 24 hours) at 10/17/2019 0943 Last data filed at 10/17/2019 0630 Gross per 24 hour  Intake 714 ml  Output 1100 ml  Net -386 ml   Filed Weights   10/14/19 0424 10/15/19 0354 10/16/19 0537  Weight: 129.5 kg 128.7 kg 130.4 kg   Examination:   Constitutional: WN/WD morbidly obese Caucasian male currently in no acute distress  Eyes: Lids and conjunctivae normal, sclerae anicteric  ENMT: MMM Neck: Appears normal, supple,  Respiratory: Diminished to auscultation bilaterally with rales at bases,  no wheezing, , rhonchi or crackles. Normal respiratory effort and patient is not tachypenic. No accessory muscle use and has unlabored breathing.   Cardiovascular: Irregularly irregular and slightly tachycardic, no murmurs / rubs / gallops.  1-2+ lower extremity edema Abdomen: Soft, non-tender, distended secondary body habitus.  Bowel sounds positive.  GU: Deferred. Musculoskeletal: No clubbing / cyanosis of digits/nails. No joint deformity upper and lower extremities.  Skin: Has multiple skin lesions in the lower extremities and he states that this is disseminated superficial actinic porokeratosis. No induration; Warm and dry. Ulcer left calf. Pale. 2+ edema Neurologic: moves all 4 extremities.  Psychiatric: somnolent, able to answer some simple questions.  Data Reviewed: I have personally reviewed following labs and imaging studies  CBC: Recent Labs  Lab 10/12/19 0427 10/12/19 0427 10/12/19 1310 10/13/19 0629 10/14/19 0657 10/14/19 0657 10/15/19 0608 10/16/19 0439 10/16/19 1843 10/16/19 2301 10/17/19 0614  WBC 9.0   < > 8.8   < > 13.4*  --  11.2* 12.9* 8.0  --  14.9*  NEUTROABS 6.2  --  5.9  --   --   --   --   --   --    --   --   HGB 7.9*   < > 7.8*   < > 7.5*   < > 7.1* 7.5* 13.8 8.4* 9.0*  HCT 25.2*   < > 25.0*   < > 23.4*   < > 22.7* 23.1* 42.9 25.9* 26.8*  MCV 96.6   < > 97.7   < > 95.5  --  95.4  95.5 95.8  --  93.7  PLT 199   < > 224   < > 221  --  201 215 154  --  225   < > = values in this interval not displayed.   Basic Metabolic Panel: Recent Labs  Lab 10/12/19 0427 10/12/19 1310 10/14/19 0657 10/15/19 0608 10/16/19 0439 10/16/19 1842 10/17/19 0614  NA 137   < > 136 135 134* 131* 133*  K 3.4*   < > 3.5 3.2* 3.3* 3.5 3.4*  CL 97*   < > 94* 93* 92* 88* 91*  CO2 31   < > 33* 33* 33* 32 33*  GLUCOSE 146*   < > 151* 175* 205* 238* 192*  BUN 29*   < > 42* 37* 31* 32* 30*  CREATININE 1.23   < > 1.57* 1.48* 1.45* 1.46* 1.45*  CALCIUM 7.8*   < > 7.7* 7.7* 7.8* 7.9* 8.0*  MG 1.6*  --  2.1 2.0 2.0  --  2.0  PHOS 1.7*  --   --   --   --   --   --    < > = values in this interval not displayed.   GFR: Estimated Creatinine Clearance: 62.5 mL/min (A) (by C-G formula based on SCr of 1.45 mg/dL (H)). Liver Function Tests: Recent Labs  Lab 10/14/19 0657 10/15/19 0608 10/16/19 0439 10/16/19 1842 10/17/19 0614  AST 40 42* 41 41 34  ALT 16 15 17 16 15   ALKPHOS 78 84 88 97 85  BILITOT 1.4* 1.2 1.1 1.2 1.5*  PROT 6.3* 6.3* 6.4* 6.9 6.4*  ALBUMIN 2.1* 2.1* 2.2* 2.3* 2.2*   No results for input(s): LIPASE, AMYLASE in the last 168 hours. No results for input(s): AMMONIA in the last 168 hours. Coagulation Profile: Recent Labs  Lab 10/14/19 0657 10/15/19 0608 10/16/19 0439 10/16/19 1842 10/17/19 0614  INR 1.7* 1.7* 1.6* 1.6* 1.7*   Cardiac Enzymes: No results for input(s): CKTOTAL, CKMB, CKMBINDEX, TROPONINI in the last 168 hours. BNP (last 3 results) No results for input(s): PROBNP in the last 8760 hours. HbA1C: No results for input(s): HGBA1C in the last 72 hours. CBG: Recent Labs  Lab 10/16/19 0801 10/16/19 1203 10/16/19 1630 10/16/19 2218 10/17/19 0749  GLUCAP 197* 180*  194* 194* 183*   Lipid Profile: No results for input(s): CHOL, HDL, LDLCALC, TRIG, CHOLHDL, LDLDIRECT in the last 72 hours. Thyroid Function Tests: No results for input(s): TSH, T4TOTAL, FREET4, T3FREE, THYROIDAB in the last 72 hours. Anemia Panel: No results for input(s): VITAMINB12, FOLATE, FERRITIN, TIBC, IRON, RETICCTPCT in the last 72 hours. Sepsis Labs: Recent Labs  Lab 10/16/19 1843  LATICACIDVEN 2.4*    Recent Results (from the past 240 hour(s))  SARS Coronavirus 2 by RT PCR (hospital order, performed in [facility] hospital lab) Nasopharyngeal Nasopharyngeal Swab     Status: None   Collection Time: 10/09/19 12:02 AM   Specimen: Nasopharyngeal Swab  Result Value Ref Range Status   SARS Coronavirus 2 NEGATIVE NEGATIVE Final    Comment: (NOTE) SARS-CoV-2 target nucleic acids are NOT DETECTED.  The SARS-CoV-2 RNA is generally detectable in upper and lower respiratory specimens during the acute phase of infection. The lowest concentration of SARS-CoV-2 viral copies this assay can detect is 250 copies / mL. A negative result does not preclude SARS-CoV-2 infection and should not be used as the sole basis for treatment or other patient management decisions.  A negative result may occur with improper specimen collection / handling,  submission of specimen other than nasopharyngeal swab, presence of viral mutation(s) within the areas targeted by this assay, and inadequate number of viral copies (<250 copies / mL). A negative result must be combined with clinical observations, patient history, and epidemiological information.  Fact Sheet for Patients:   StrictlyIdeas.no  Fact Sheet for Healthcare Providers: BankingDealers.co.za  This test is not yet approved or  cleared by the [place] FDA and has been authorized for detection and/or diagnosis of SARS-CoV-2 by FDA under an Emergency Use Authorization (EUA).  This EUA will  remain in effect (meaning this test can be used) for the duration of the COVID-19 declaration under Section 564(b)(1) of the Act, 21 U.S.C. section 360bbb-3(b)(1), unless the authorization is terminated or revoked sooner.  Performed at [facility], [address] [place], [place] [address]   Gastrointestinal Panel by PCR , Stool     Status: None   Collection Time: 10/11/19  4:07 PM   Specimen: Stool  Result Value Ref Range Status   Campylobacter species NOT DETECTED NOT DETECTED Final   Plesimonas shigelloides NOT DETECTED NOT DETECTED Final   Salmonella species NOT DETECTED NOT DETECTED Final   Yersinia enterocolitica NOT DETECTED NOT DETECTED Final   Vibrio species NOT DETECTED NOT DETECTED Final   Vibrio cholerae NOT DETECTED NOT DETECTED Final   Enteroaggregative E coli (EAEC) NOT DETECTED NOT DETECTED Final   Enteropathogenic E coli (EPEC) NOT DETECTED NOT DETECTED Final   Enterotoxigenic E coli (ETEC) NOT DETECTED NOT DETECTED Final   Shiga like toxin producing E coli (STEC) NOT DETECTED NOT DETECTED Final   Shigella/Enteroinvasive E coli (EIEC) NOT DETECTED NOT DETECTED Final   Cryptosporidium NOT DETECTED NOT DETECTED Final   Cyclospora cayetanensis NOT DETECTED NOT DETECTED Final   Entamoeba histolytica NOT DETECTED NOT DETECTED Final   Giardia lamblia NOT DETECTED NOT DETECTED Final   Adenovirus F40/41 NOT DETECTED NOT DETECTED Final   Astrovirus NOT DETECTED NOT DETECTED Final   Norovirus GI/GII NOT DETECTED NOT DETECTED Final   Rotavirus A NOT DETECTED NOT DETECTED Final   Sapovirus (I, II, IV, and V) NOT DETECTED NOT DETECTED Final    Comment: Performed at [facility], [address] [place], [place] [address]  CULTURE, BLOOD (ROUTINE X 2) w Reflex to ID Panel     Status: None (Preliminary result)   Collection Time: 10/16/19 12:21 PM   Specimen: BLOOD  Result Value Ref Range Status   Specimen Description BLOOD [facility]  Final   Special Requests    Final    BOTTLES DRAWN AEROBIC AND ANAEROBIC Blood Culture adequate volume   Culture   Final    NO GROWTH < 24 HOURS Performed at [facility], [address] [place], [place] [address]    Report Status PENDING  Incomplete  CULTURE, BLOOD (ROUTINE X 2) w Reflex to ID Panel     Status: None (Preliminary result)   Collection Time: 10/16/19 12:32 PM   Specimen: BLOOD  Result Value Ref Range Status   Specimen Description BLOOD [facility]  Final   Special Requests   Final    BOTTLES DRAWN AEROBIC ONLY Blood Culture adequate volume   Culture   Final    NO GROWTH < 24 HOURS Performed at [facility], [address] [place], [place] [address]    Report Status PENDING  Incomplete    RN Pressure Injury Documentation:     Estimated body mass index is 43.71 kg/m as calculated from  the following:   Height as of this encounter: 5' 8"  (1.727 m).   Weight as of this encounter: 130.4 kg.  Malnutrition Type:      Malnutrition Characteristics:      Nutrition Interventions:    Radiology Studies: DG Chest Port 1 View  Result Date: 10/16/2019 CLINICAL DATA:  Hypertension, CHF EXAM: PORTABLE CHEST 1 VIEW COMPARISON:  10/12/2019 FINDINGS: Mild bilateral interstitial thickening. No pleural effusion or pneumothorax. No focal consolidation. Stable cardiomegaly. Dual lead cardiac pacemaker. No acute osseous abnormality. IMPRESSION: Cardiomegaly with mild pulmonary vascular congestion. Electronically Signed   By: Kathreen Devoid   On: 10/16/2019 15:37     Scheduled Meds: . sodium chloride   Intravenous Once  . Chlorhexidine Gluconate Cloth  6 each Topical Daily  . furosemide  40 mg Intravenous BID  . hydrocerin   Topical Daily  . insulin aspart  0-15 Units Subcutaneous TID WC  . insulin glargine  10 Units Subcutaneous QHS  . metoprolol tartrate  50 mg Oral BID  . pantoprazole (PROTONIX) IV  40 mg Intravenous Q12H  . potassium citrate  40 mEq Oral TID WC  . pravastatin  20 mg  Oral q1800  . sodium chloride flush  3 mL Intravenous Q12H   Continuous Infusions: . sodium chloride      LOS: 9 days   Desma Maxim, MD [facility] PAGER is on AMION  If 7PM-7AM, please contact night-coverage [email or web address]

## 2019-10-18 DIAGNOSIS — A0472 Enterocolitis due to Clostridium difficile, not specified as recurrent: Secondary | ICD-10-CM | POA: Diagnosis not present

## 2019-10-18 LAB — BASIC METABOLIC PANEL
Anion gap: 10 (ref 5–15)
BUN: 26 mg/dL — ABNORMAL HIGH (ref 8–23)
CO2: 30 mmol/L (ref 22–32)
Calcium: 7.8 mg/dL — ABNORMAL LOW (ref 8.9–10.3)
Chloride: 93 mmol/L — ABNORMAL LOW (ref 98–111)
Creatinine, Ser: 1.38 mg/dL — ABNORMAL HIGH (ref 0.61–1.24)
GFR calc Af Amer: 60 mL/min — ABNORMAL LOW (ref >60)
GFR calc non Af Amer: 51 mL/min — ABNORMAL LOW (ref >60)
Glucose, Bld: 145 mg/dL — ABNORMAL HIGH (ref 70–99)
Potassium: 3.6 mmol/L (ref 3.5–5.1)
Sodium: 133 mmol/L — ABNORMAL LOW (ref 135–145)

## 2019-10-18 LAB — CBC
HCT: 26.6 % — ABNORMAL LOW (ref 39.0–52.0)
Hemoglobin: 8.6 g/dL — ABNORMAL LOW (ref 13.0–17.0)
MCH: 31.3 pg (ref 26.0–34.0)
MCHC: 32.3 g/dL (ref 30.0–36.0)
MCV: 96.7 fL (ref 80.0–100.0)
Platelets: 260 10*3/uL (ref 150–400)
RBC: 2.75 MIL/uL — ABNORMAL LOW (ref 4.22–5.81)
RDW: 20.8 % — ABNORMAL HIGH (ref 11.5–15.5)
WBC: 15.8 10*3/uL — ABNORMAL HIGH (ref 4.0–10.5)
nRBC: 0 % (ref 0.0–0.2)

## 2019-10-18 LAB — HEPATIC FUNCTION PANEL
ALT: 16 U/L (ref 0–44)
AST: 35 U/L (ref 15–41)
Albumin: 2.1 g/dL — ABNORMAL LOW (ref 3.5–5.0)
Alkaline Phosphatase: 90 U/L (ref 38–126)
Bilirubin, Direct: 0.5 mg/dL — ABNORMAL HIGH (ref 0.0–0.2)
Indirect Bilirubin: 0.9 mg/dL (ref 0.3–0.9)
Total Bilirubin: 1.4 mg/dL — ABNORMAL HIGH (ref 0.3–1.2)
Total Protein: 6.2 g/dL — ABNORMAL LOW (ref 6.5–8.1)

## 2019-10-18 LAB — PROTIME-INR
INR: 1.8 — ABNORMAL HIGH (ref 0.8–1.2)
Prothrombin Time: 19.9 seconds — ABNORMAL HIGH (ref 11.4–15.2)

## 2019-10-18 LAB — GLUCOSE, CAPILLARY
Glucose-Capillary: 157 mg/dL — ABNORMAL HIGH (ref 70–99)
Glucose-Capillary: 127 mg/dL — ABNORMAL HIGH (ref 70–99)
Glucose-Capillary: 155 mg/dL — ABNORMAL HIGH (ref 70–99)
Glucose-Capillary: 141 mg/dL — ABNORMAL HIGH (ref 70–99)

## 2019-10-18 LAB — PTT FACTOR INHIBITOR (MIXING STUDY): aPTT: 31.3 s — ABNORMAL HIGH (ref 22.9–30.2)

## 2019-10-18 LAB — MAGNESIUM: Magnesium: 2 mg/dL (ref 1.7–2.4)

## 2019-10-18 MED ORDER — HYDROCODONE-ACETAMINOPHEN 5-325 MG PO TABS
1.0000 | ORAL_TABLET | Freq: Four times a day (QID) | ORAL | Status: DC | PRN
  Administered 2019-10-19: 2 via ORAL
  Administered 2019-10-19 (×2): 1 via ORAL
  Administered 2019-10-20 (×2): 2 via ORAL
  Administered 2019-10-21 (×2): 1 via ORAL
  Administered 2019-10-22: 2 via ORAL
  Administered 2019-10-22: 1 via ORAL
  Administered 2019-10-22: 2 via ORAL
  Filled 2019-10-18: qty 1
  Filled 2019-10-18 (×3): qty 2
  Filled 2019-10-18: qty 1
  Filled 2019-10-18: qty 2
  Filled 2019-10-18: qty 1
  Filled 2019-10-18: qty 2
  Filled 2019-10-18 (×2): qty 1

## 2019-10-18 MED ORDER — ADULT MULTIVITAMIN W/MINERALS CH
1.0000 | ORAL_TABLET | Freq: Every day | ORAL | Status: DC
  Administered 2019-10-19 – 2019-10-23 (×5): 1 via ORAL
  Filled 2019-10-18 (×4): qty 1

## 2019-10-18 MED ORDER — NEPRO/CARBSTEADY PO LIQD
237.0000 mL | Freq: Two times a day (BID) | ORAL | Status: DC
  Administered 2019-10-20 (×2): 237 mL via ORAL

## 2019-10-18 MED ORDER — HYDROMORPHONE HCL 1 MG/ML IJ SOLN
0.5000 mg | Freq: Once | INTRAMUSCULAR | Status: AC
  Administered 2019-10-18: 0.5 mg via INTRAVENOUS
  Filled 2019-10-18: qty 1

## 2019-10-18 MED ORDER — GERHARDT'S BUTT CREAM
TOPICAL_CREAM | Freq: Three times a day (TID) | CUTANEOUS | Status: DC
  Administered 2019-10-20: 1 via TOPICAL
  Filled 2019-10-18 (×2): qty 1

## 2019-10-18 NOTE — Consult Note (Signed)
[place] Nurse Consult Note: Reason for Consult: erythematous buttocks secondary to loose, watery stools Wound type: Moisture associated skin damage (MASD), specifically,  IAD (Incontinence associated dermatitis) Pressure Injury POA: N/A Measurement:N/A Wound bed:N/A Drainage (amount, consistency, odor) N/A Periwound:bright red erythema to buttocks, scrotum, posterior and medial thighs Dressing procedure/placement/frequency: Discussed with bedside RN. Patient's indwelling bowel management system (FlexiSeal) has been discontinued. I will place patient on a mattress replacement with low air loss feature to mitigate the moist microclimate and ask that nursing use no adult briefs or plastic (disposable) under pads beneath the patient, instead using the antimicrobial DermaTherapy under pads. Side to side positioning (minimizing time in the supine position) is recommended with a pillow between the patient's knees to promote airflow.  Topical care will be with Gerhart's Butt Cream, a compounded 1:1:1 hydrocortisone, lotrimin and zinc oxide preparation applied three times daily and PRN fecal incontinence. Additionally, I have asked Dr. Si Raider via [facility] to consider a systemic antifungal for a short course of therapy (5-7 days).  [place] nursing team will not follow, but will remain available to this patient, the nursing and medical teams.  Please re-consult if needed. Thanks, Maudie Flakes, MSN, RN, [place], Arther Abbott  Pager# [phone]

## 2019-10-18 NOTE — Progress Notes (Signed)
Reported positive C Diff results to Jonny Ruiz NP

## 2019-10-18 NOTE — Progress Notes (Signed)
PT Cancellation Note  Patient Details Name: Christian Berg MRN: [id number] DOB: [birth year]   Cancelled Treatment:    Reason Eval/Treat Not Completed: Patient not medically ready.  Chart reviewed.  Upon therapists arrival to unit, pt noted to be in a-fib rhythm with HR fluctuating between 116 bpm and 140 bpm (pt noted to be resting in bed).  D/t elevated HR at rest, pt does not appear appropriate for exertional activity at this time.  Will re-attempt PT treatment session at a later date/time as medically appropriate.  Leitha Bleak, PT 10/18/19, 11:57 AM

## 2019-10-18 NOTE — Progress Notes (Signed)
[name]  PROGRESS NOTE    Christian Berg  SAY:[id number] DOB: 12/05/1950 DOA: 10/08/2019 PCP: Birdie Sons, MD   Brief Narrative: HPI per Dr. Gala Romney on 10/09/19 Christian Berg is a 69 y.o. male with medical history significant of diabetes, hypertension, diastolic dysfunction CHF, disseminated superficial actinic porokeratosis, degenerative disc disease, recent bleeding episodes, atrial fibrillation and peripheral vascular disease who presented to the ER with left lower extremity ulcer.  He already has a right lower extremity ulcer.  He came in for wound check.  Patient has noted increasing weight about 25 pounds more in the last few weeks.  He is taking diuretics at home.  He also has noted increasing shortness of breath.  He has bruising easily and was told to stop his Coumadin about a week ago.  Patient was seen in the ER in addition to his leg wounds was found to be having episode of anasarca.  Appears he has acute exacerbation of his diastolic CHF so he is being admitted to the hospital for further evaluation and treatment..  ED Course: Temperature is 99.1 blood pressure 130/47 pulse 140 respirate 26 oxygen sats 94% on room air.  White count 9.0 hemoglobin 9.3 and platelet 235.  Chemistry appears to be within normal except for creatinine 1.38 and calcium 8.4.  Glucose 145.  COVID-19 screen is negative.  Chest x-ray showed evidence of mild fluid overload.  X-ray of the tibia-fibula shows no evidence of osseous abnormality.  Patient being admitted with acute exacerbation of CHF and also possible infected wound.  **Interim History  Is started on IV Lasix and this was changed to Lasix drip.  Wound care was consulted for the left lower extremity and cefepime was started for infection.  He started having significant amount of diarrhea but clinically do not suspect C. difficile unlikely this is antibiotic mediated diarrhea.  He is currently afebrile and has no leukocytosis.  Still remains very  swollen so cardiology was consulted for further evaluation as he remains on Lasix drip. On 9/15 worsening diarrhea, fecal occult blood psoitive, and down trendinghemoglobin. Gi consulted, EGD on 9/16 showed actively bleeding duodenal lesion that was clipped. H to low 7s, transfused one unit. On 9/17 H stable in 7s, no emesis. On 9/18 remains on IV lasix drip, hgb stable low 7s. 9/19 low BPs. On 9/19 low bps with maps in the low 60s. Stopped lasix gtt and IV lasix, hgb stable in 7s, transfused one unit. Foley placed, pan-cultured. 9/20 hemodynamics improved, mentating clearly.  Assessment & Plan:   Principal Problem:   (HFpEF) heart failure with preserved ejection fraction (HCC) Active Problems:   Diabetes mellitus ([place])   Benign essential HTN   Benign prostatic hyperplasia with urinary obstruction   Chronic atrial fibrillation (HCC)   Morbid (severe) obesity due to excess calories (HCC)   NASH (nonalcoholic steatohepatitis)   Bilateral carotid artery stenosis   CKD stage 3 due to type 2 diabetes mellitus (HCC)   Acute exacerbation of CHF (congestive heart failure) (HCC)   Leg wound, left, initial encounter  C. Diff colitis 9/20 reporting upper abdominal pain, mild tenderness to palpation, and increased watery stool output. WBC slow up-trend. CT showing mild colitis. C diff positive and started on oral vancomycin, at this point infection appears to be mild. Hemodynamically stable this AM - continue oral vancomycin  Acute on Chronic Diastolic Congestive Heart Failure. -Patient presented with evidence of bichamber congestive heart failure.  Severe volume overload with anasarca. -BNP on  admission was 399.1 -Reviewed patient most recent echocardiogram performed in 08/05/2019, ejection fraction was normal. -No significant pulmonary hypertension. - Off O2 - lasix per cardiology, is now off lasix gtt -Continues to have evidence of volume overload but unclear to what extent LE edema is  cardiac-mediated - cont incentive spirometry  Left leg wound with cellulitis in the setting of DSAP Resolved, treated  Bleeding duodenal lesion Acute blood loss anemia EGD 9/16, actively bleeding lesion clipped.  gi pathogen panel negative. hgb stable since clipping and transfusion 1 U transfused 9/16, pt tolerated well. Another unit transfused 9/19 - appreciate GI recs. Now s/p 5 days ceftriaxone for sbp ppx - H/H stable, will transfuse to keep H above 8 (today's hgb is pending  Chronic Atrial fibrillation with rapid ventricular response. -Patient was taken off of warfarin recently due to concern of skin bleeding.  Patient has some tachycardia, continue beta-blocker. -INR prior to admission was >5, now < 2 - holding on anticoagulation given active bleed  Hypokalemia - 3.4 this am, mg wnl - increase kcl to 40 tid  Type 2 Diabetes Mellitus -Continue to Hold Metformin -Continue sliding scale insulin. CBGs upper 100s - cont lantus 10 units nightly   NASH liver cirrhosis. -Continue to monitor. - LFTs stable. T billi mildly elevated, will check fractionated. No RUQ pain/tenderness, no n/v  Chronic kidney disease stage IIIa. Stable  Essential Hypertension. Stable   Morbid Obesity -Estimated body mass index is 43.11 kg/m as calculated from the following:   Height as of this encounter: 5' 8"  (1.727 m).   Weight as of this encounter: 128.6 kg. -Weight Loss and Dietary Counseling given   DVT prophylaxis: TED hose Code Status: FULL CODE  Family Communication: will attempt to update wife later today Disposition Plan: SNF  Status is: Inpatient  Remains inpatient appropriate because:Unsafe d/c plan, IV treatments appropriate due to intensity of illness or inability to take PO and Inpatient level of care appropriate due to severity of illness   Dispo: The patient is from: Home              Anticipated d/c is to: TBD              Anticipated d/c date is: 5 days               Patient currently is not medically stable to d/c.  Consultants:   Cardiology  GI   Procedures:   EGD 9/16  Blood transfusion 9/16, 9/18  Lines/Tubes - Foley placed 9/19  Antimicrobials:  Anti-infectives (From admission, onward)   Start     Dose/Rate Route Frequency Ordered Stop   10/17/19 1800  vancomycin (VANCOCIN) 50 mg/mL oral solution 125 mg        125 mg Oral 4 times daily 10/17/19 1708 10/27/19 1759   10/16/19 1400  cefTRIAXone (ROCEPHIN) 1 g in sodium chloride 0.9 % 100 mL IVPB        1 g 200 mL/hr over 30 Minutes Intravenous Every 24 hours 10/15/19 1508 10/17/19 0850   10/12/19 1400  cefTRIAXone (ROCEPHIN) 2 g in sodium chloride 0.9 % 100 mL IVPB  Status:  Discontinued        2 g 200 mL/hr over 30 Minutes Intravenous Every 24 hours 10/12/19 1327 10/15/19 1508   10/11/19 2100  ceFEPIme (MAXIPIME) 2 g in sodium chloride 0.9 % 100 mL IVPB  Status:  Discontinued        2 g 200 mL/hr over 30 Minutes Intravenous  Every 8 hours 10/10/19 1853 10/10/19 1859   10/10/19 1900  ceFEPIme (MAXIPIME) 2 g in sodium chloride 0.9 % 100 mL IVPB  Status:  Discontinued        2 g 200 mL/hr over 30 Minutes Intravenous Every 8 hours 10/10/19 1859 10/12/19 1215   10/09/19 0000  ceFEPIme (MAXIPIME) 2 g in sodium chloride 0.9 % 100 mL IVPB  Status:  Discontinued        2 g 200 mL/hr over 30 Minutes Intravenous Every 8 hours 10/08/19 2346 10/10/19 1853        Subjective: Tired. Continued intermittent upper abd pain. No vomiting. No chest pain or dyspnea  Objective: Vitals:   10/18/19 0310 10/18/19 0315 10/18/19 0345 10/18/19 0721  BP: (!) 119/57   120/67  Pulse:    (!) 125  Resp: 18 17 15 18   Temp: 99.3 F (37.4 C)   99.6 F (37.6 C)  TempSrc: Oral   Oral  SpO2: 97%   94%  Weight: 128.6 kg     Height:        Intake/Output Summary (Last 24 hours) at 10/18/2019 1012 Last data filed at 10/18/2019 0600 Gross per 24 hour  Intake 790 ml  Output 1900 ml  Net -1110 ml    Filed Weights   10/15/19 0354 10/16/19 0537 10/18/19 0310  Weight: 128.7 kg 130.4 kg 128.6 kg   Examination:   Constitutional: WN/WD morbidly obese Caucasian male currently in no acute distress  Eyes: Lids and conjunctivae normal, sclerae anicteric  ENMT: MMM Neck: Appears normal, supple,  Respiratory: Diminished to auscultation bilaterally with rales at bases,  no wheezing, , rhonchi or crackles. Normal respiratory effort and patient is not tachypenic. No accessory muscle use and has unlabored breathing.   Cardiovascular: Irregularly irregular and slightly tachycardic, no murmurs / rubs / gallops.  1-2+ lower extremity edema Abdomen: Soft, obese, mild ttp upper abd.  GU: Deferred. Musculoskeletal: No clubbing / cyanosis of digits/nails. No joint deformity upper and lower extremities.  Skin: Has multiple skin lesions in the lower extremities and he states that this is disseminated superficial actinic porokeratosis. No induration; Warm and dry. Ulcer left calf. Pale. 2+ edema Neurologic: moves all 4 extremities.  Psychiatric: somnolent, able to answer some simple questions.  Data Reviewed: I have personally reviewed following labs and imaging studies  CBC: Recent Labs  Lab 10/12/19 0427 10/12/19 0427 10/12/19 1310 10/13/19 0629 10/14/19 0657 10/14/19 0657 10/15/19 0608 10/16/19 0439 10/16/19 1843 10/16/19 2301 10/17/19 0614  WBC 9.0   < > 8.8   < > 13.4*  --  11.2* 12.9* 8.0  --  14.9*  NEUTROABS 6.2  --  5.9  --   --   --   --   --   --   --   --   HGB 7.9*   < > 7.8*   < > 7.5*   < > 7.1* 7.5* 13.8 8.4* 9.0*  HCT 25.2*   < > 25.0*   < > 23.4*   < > 22.7* 23.1* 42.9 25.9* 26.8*  MCV 96.6   < > 97.7   < > 95.5  --  95.4 95.5 95.8  --  93.7  PLT 199   < > 224   < > 221  --  201 215 154  --  225   < > = values in this interval not displayed.   Basic Metabolic Panel: Recent Labs  Lab 10/12/19 0427 10/12/19 1310 10/14/19 8546  10/15/19 2836 10/16/19 0439  10/16/19 1842 10/17/19 0614 10/18/19 0636  NA 137   < > 136 135 134* 131* 133*  --   K 3.4*   < > 3.5 3.2* 3.3* 3.5 3.4*  --   CL 97*   < > 94* 93* 92* 88* 91*  --   CO2 31   < > 33* 33* 33* 32 33*  --   GLUCOSE 146*   < > 151* 175* 205* 238* 192*  --   BUN 29*   < > 42* 37* 31* 32* 30*  --   CREATININE 1.23   < > 1.57* 1.48* 1.45* 1.46* 1.45*  --   CALCIUM 7.8*   < > 7.7* 7.7* 7.8* 7.9* 8.0*  --   MG 1.6*  --  2.1 2.0 2.0  --  2.0 2.0  PHOS 1.7*  --   --   --   --   --   --   --    < > = values in this interval not displayed.   GFR: Estimated Creatinine Clearance: 62 mL/min (A) (by C-G formula based on SCr of 1.45 mg/dL (H)). Liver Function Tests: Recent Labs  Lab 10/14/19 0657 10/15/19 0608 10/16/19 0439 10/16/19 1842 10/17/19 0614  AST 40 42* 41 41 34  ALT 16 15 17 16 15   ALKPHOS 78 84 88 97 85  BILITOT 1.4* 1.2 1.1 1.2 1.4*  1.5*  PROT 6.3* 6.3* 6.4* 6.9 6.4*  ALBUMIN 2.1* 2.1* 2.2* 2.3* 2.2*   Recent Labs  Lab 10/17/19 0614  LIPASE 38   No results for input(s): AMMONIA in the last 168 hours. Coagulation Profile: Recent Labs  Lab 10/15/19 0608 10/16/19 0439 10/16/19 1842 10/17/19 0614 10/18/19 0636  INR 1.7* 1.6* 1.6* 1.7* 1.8*   Cardiac Enzymes: No results for input(s): CKTOTAL, CKMB, CKMBINDEX, TROPONINI in the last 168 hours. BNP (last 3 results) No results for input(s): PROBNP in the last 8760 hours. HbA1C: No results for input(s): HGBA1C in the last 72 hours. CBG: Recent Labs  Lab 10/17/19 0749 10/17/19 1116 10/17/19 1751 10/17/19 2058 10/18/19 0720  GLUCAP 183* 200* 192* 162* 157*   Lipid Profile: No results for input(s): CHOL, HDL, LDLCALC, TRIG, CHOLHDL, LDLDIRECT in the last 72 hours. Thyroid Function Tests: No results for input(s): TSH, T4TOTAL, FREET4, T3FREE, THYROIDAB in the last 72 hours. Anemia Panel: No results for input(s): VITAMINB12, FOLATE, FERRITIN, TIBC, IRON, RETICCTPCT in the last 72 hours. Sepsis Labs: Recent Labs   Lab 10/16/19 1843  LATICACIDVEN 2.4*    Recent Results (from the past 240 hour(s))  SARS Coronavirus 2 by RT PCR (hospital order, performed in [facility] hospital lab) Nasopharyngeal Nasopharyngeal Swab     Status: None   Collection Time: 10/09/19 12:02 AM   Specimen: Nasopharyngeal Swab  Result Value Ref Range Status   SARS Coronavirus 2 NEGATIVE NEGATIVE Final    Comment: (NOTE) SARS-CoV-2 target nucleic acids are NOT DETECTED.  The SARS-CoV-2 RNA is generally detectable in upper and lower respiratory specimens during the acute phase of infection. The lowest concentration of SARS-CoV-2 viral copies this assay can detect is 250 copies / mL. A negative result does not preclude SARS-CoV-2 infection and should not be used as the sole basis for treatment or other patient management decisions.  A negative result may occur with improper specimen collection / handling, submission of specimen other than nasopharyngeal swab, presence of viral mutation(s) within the areas targeted by this assay, and inadequate number of  viral copies (<250 copies / mL). A negative result must be combined with clinical observations, patient history, and epidemiological information.  Fact Sheet for Patients:   StrictlyIdeas.no  Fact Sheet for Healthcare Providers: BankingDealers.co.za  This test is not yet approved or  cleared by the [place] FDA and has been authorized for detection and/or diagnosis of SARS-CoV-2 by FDA under an Emergency Use Authorization (EUA).  This EUA will remain in effect (meaning this test can be used) for the duration of the COVID-19 declaration under Section 564(b)(1) of the Act, 21 U.S.C. section 360bbb-3(b)(1), unless the authorization is terminated or revoked sooner.  Performed at [facility], [address] [place], [place] [address]   Gastrointestinal Panel by PCR , Stool     Status: None   Collection  Time: 10/11/19  4:07 PM   Specimen: Stool  Result Value Ref Range Status   Campylobacter species NOT DETECTED NOT DETECTED Final   Plesimonas shigelloides NOT DETECTED NOT DETECTED Final   Salmonella species NOT DETECTED NOT DETECTED Final   Yersinia enterocolitica NOT DETECTED NOT DETECTED Final   Vibrio species NOT DETECTED NOT DETECTED Final   Vibrio cholerae NOT DETECTED NOT DETECTED Final   Enteroaggregative E coli (EAEC) NOT DETECTED NOT DETECTED Final   Enteropathogenic E coli (EPEC) NOT DETECTED NOT DETECTED Final   Enterotoxigenic E coli (ETEC) NOT DETECTED NOT DETECTED Final   Shiga like toxin producing E coli (STEC) NOT DETECTED NOT DETECTED Final   Shigella/Enteroinvasive E coli (EIEC) NOT DETECTED NOT DETECTED Final   Cryptosporidium NOT DETECTED NOT DETECTED Final   Cyclospora cayetanensis NOT DETECTED NOT DETECTED Final   Entamoeba histolytica NOT DETECTED NOT DETECTED Final   Giardia lamblia NOT DETECTED NOT DETECTED Final   Adenovirus F40/41 NOT DETECTED NOT DETECTED Final   Astrovirus NOT DETECTED NOT DETECTED Final   Norovirus GI/GII NOT DETECTED NOT DETECTED Final   Rotavirus A NOT DETECTED NOT DETECTED Final   Sapovirus (I, II, IV, and V) NOT DETECTED NOT DETECTED Final    Comment: Performed at [facility], [address] [place], [place] [address]  CULTURE, BLOOD (ROUTINE X 2) w Reflex to ID Panel     Status: None (Preliminary result)   Collection Time: 10/16/19 12:21 PM   Specimen: BLOOD  Result Value Ref Range Status   Specimen Description BLOOD [facility]  Final   Special Requests   Final    BOTTLES DRAWN AEROBIC AND ANAEROBIC Blood Culture adequate volume   Culture   Final    NO GROWTH 2 DAYS Performed at [facility], [address] [place], [place] [address]    Report Status PENDING  Incomplete  CULTURE, BLOOD (ROUTINE X 2) w Reflex to ID Panel     Status: None (Preliminary result)   Collection Time: 10/16/19 12:32 PM   Specimen: BLOOD   Result Value Ref Range Status   Specimen Description BLOOD [facility]  Final   Special Requests   Final    BOTTLES DRAWN AEROBIC ONLY Blood Culture adequate volume   Culture   Final    NO GROWTH 2 DAYS Performed at [facility], [address] [place], [place] [address]    Report Status PENDING  Incomplete  Urine Culture     Status: None   Collection Time: 10/16/19  3:34 PM   Specimen: Urine, Random  Result Value Ref Range Status   Specimen Description   Final    URINE, RANDOM Performed at [facility],  [place], [place] [address]    Special Requests   Final    NONE Performed at [facility] [address] [place], [place] [address]    Culture   Final    NO GROWTH Performed at [facility], [place] [address] [place], [place] [address]    Report Status 10/17/2019 FINAL  Final  C Difficile Quick Screen (NO PCR Reflex)     Status: Abnormal   Collection Time: 10/17/19  8:15 PM   Specimen: STOOL  Result Value Ref Range Status   C Diff antigen POSITIVE (A) NEGATIVE Final   C Diff toxin POSITIVE (A) NEGATIVE Final   C Diff interpretation Toxin producing C. difficile detected.  Final    Comment: CRITICAL RESULT CALLED TO, READ BACK BY AND VERIFIED WITH: DENISE [facility] 10/17/19 AT 2127 BY ACR Performed at [facility], [address] [place], [place] [address]     RN Pressure Injury Documentation:     Estimated body mass index is 43.11 kg/m as calculated from the following:   Height as of this encounter: 5' 8"  (1.727 m).   Weight as of this encounter: 128.6 kg.  Malnutrition Type:      Malnutrition Characteristics:      Nutrition Interventions:    Radiology Studies: CT ABDOMEN PELVIS W CONTRAST  Result Date: 10/17/2019 CLINICAL DATA:  Upper abdominal pain. EXAM: CT ABDOMEN AND PELVIS WITH CONTRAST TECHNIQUE: Multidetector CT imaging of the abdomen and pelvis was performed using the standard protocol following  bolus administration of intravenous contrast. CONTRAST:  <See Chart> OMNIPAQUE IOHEXOL 300 MG/ML SOLN, 34m OMNIPAQUE IOHEXOL 350 MG/ML SOLN COMPARISON:  August 04, 2019 FINDINGS: Lower chest: There is a small left-sided pleural effusion. There is bibasilar atelectasis.The heart is enlarged. Hepatobiliary: The liver is cirrhotic. Normal gallbladder.There is no biliary ductal dilation. Pancreas: Normal contours without ductal dilatation. No peripancreatic fluid collection. Spleen: Unremarkable. Adrenals/Urinary Tract: --Adrenal glands: Unremarkable. --Right kidney/ureter: No hydronephrosis or radiopaque kidney stones. --Left kidney/ureter: No hydronephrosis or radiopaque kidney stones. --Urinary bladder: The bladder is decompressed by Foley catheter and therefore is poorly evaluated. Stomach/Bowel: --Stomach/Duodenum: There is a surgical clip within the proximal duodenum. --Small bowel: Unremarkable. --Colon: There is mild diffuse circumferential wall thickening of the sigmoid and descending colon. There is a metallic foreign body at the level of the proximal descending colon which may be related to recent intervention. --Appendix: Surgically absent. Vascular/Lymphatic: Atherosclerotic calcification is present within the non-aneurysmal abdominal aorta, without hemodynamically significant stenosis. There is evidence for portal hypertension with recanalization of the umbilical vein in addition to extensive portosystemic shunting. The portal vein appears patent. The SMV is patent. The splenic vein remains patent. There may be some small esophageal varices. --No retroperitoneal lymphadenopathy. --No mesenteric lymphadenopathy. --No pelvic or inguinal lymphadenopathy. Reproductive: Unremarkable Other: There is a small volume of abdominal ascites. The abdominal wall is normal. Musculoskeletal. No acute displaced fractures. IMPRESSION: 1. Mild diffuse circumferential wall thickening of the sigmoid and descending colon may be  secondary to underdistention or mild colitis. 2. Cirrhosis with stigmata of portal hypertension including recanalization of the umbilical vein and extensive portosystemic shunting. 3. Small volume abdominal ascites. 4. Small left-sided pleural effusion with bibasilar atelectasis. 5. Cardiomegaly. Aortic Atherosclerosis (ICD10-I70.0). Electronically Signed   By: CConstance HolsterM.D.   On: 10/17/2019 16:41   DG Chest Port 1 View  Result Date: 10/16/2019 CLINICAL DATA:  Hypertension, CHF EXAM: PORTABLE CHEST 1 VIEW COMPARISON:  10/12/2019 FINDINGS: Mild  bilateral interstitial thickening. No pleural effusion or pneumothorax. No focal consolidation. Stable cardiomegaly. Dual lead cardiac pacemaker. No acute osseous abnormality. IMPRESSION: Cardiomegaly with mild pulmonary vascular congestion. Electronically Signed   By: Kathreen Devoid   On: 10/16/2019 15:37     Scheduled Meds: . sodium chloride   Intravenous Once  . Chlorhexidine Gluconate Cloth  6 each Topical Daily  . hydrocerin   Topical Daily  . insulin aspart  0-15 Units Subcutaneous TID WC  . insulin glargine  10 Units Subcutaneous QHS  . metoprolol tartrate  50 mg Oral BID  . pantoprazole (PROTONIX) IV  40 mg Intravenous Q12H  . potassium chloride  40 mEq Oral TID WC  . pravastatin  20 mg Oral q1800  . sodium chloride flush  3 mL Intravenous Q12H  . vancomycin  125 mg Oral QID   Continuous Infusions: . sodium chloride      LOS: 10 days   Desma Maxim, MD [facility] PAGER is on AMION  If 7PM-7AM, please contact night-coverage [email or web address]

## 2019-10-18 NOTE — Progress Notes (Addendum)
Initial Nutrition Assessment  DOCUMENTATION CODES:   Morbid obesity  INTERVENTION:   Nepro Shake po BID, each supplement provides 425 kcal and 19 grams protein  MVI daily   Liberalize diet   Pt at high refeed risk; recommend monitor potassium, magnesium and phosphorus labs daily as oral intake improves.   NUTRITION DIAGNOSIS:   Inadequate oral intake related to acute illness as evidenced by meal completion < 25%.  GOAL:   Patient will meet greater than or equal to 90% of their needs  MONITOR:   PO intake, Supplement acceptance, Labs, Weight trends, Skin, I & O's  REASON FOR ASSESSMENT:   LOS    ASSESSMENT:   69 y.o. male with medical history significant of diabetes, hypertension, diastolic dysfunction CHF, disseminated superficial actinic porokeratosis, degenerative disc disease, recent bleeding episodes, atrial fibrillation and peripheral vascular disease who presented to the ER with left lower extremity ulcer, C-diff and CHF   Pt s/p EGD 9/16; found to have bleeding vessel in 2nd portion of the duodenum.   Met with pt in room today. Pt reports poor appetite and oral intake in hospital. Pt reports that he is feeling a little nauseas today and has no desire to eat. Pt reports eating a orange ice pop only today. Pt is documented to be eating <25% of meals. Pt reports that he does not like supplements. RD dicussed with pt the importance of adequate nutrition needed to preserve lean muscle. Pt is willing to try butter pecan Nepro. RD will add supplements and MVI to help pt meet his estimated needs. RD will also liberalize pt's diet as pt is not eating enough to exceed any nutrient limits and a renal diet is restrictive of protein. Pt is likely at refeed risk.   Per chart, pt appears weight stable at baseline. Pt initially up 36lbs from his UBW at beginning of admit but appears to be back down to his UBW now.  Medications reviewed and include: insulin, protonix, vancomycin    Labs reviewed: Na 133(L), BUN 26(H), creat 1.38(H), Mg 2.0 wnl Wbc- 15.8(H), Hgb 8.6(L), Hct 26.6(L)  NUTRITION - FOCUSED PHYSICAL EXAM:    Most Recent Value  Orbital Region Mild depletion  Upper Arm Region No depletion  Thoracic and Lumbar Region No depletion  Buccal Region No depletion  Temple Region No depletion  Clavicle Bone Region Mild depletion  Clavicle and Acromion Bone Region Mild depletion  Scapular Bone Region No depletion  Dorsal Hand No depletion  Patellar Region Mild depletion  Anterior Thigh Region Mild depletion  Posterior Calf Region Mild depletion  Edema (RD Assessment) Moderate  Hair Reviewed  Eyes Reviewed  Mouth Reviewed  Skin Reviewed  [dry, scaly skin lesions]  Nails Reviewed     Diet Order:   Diet Order            Diet 2 gram sodium Room service appropriate? Yes; Fluid consistency: Thin; Fluid restriction: 1200 mL Fluid  Diet effective now                EDUCATION NEEDS:   Education needs have been addressed  Skin:  Skin Assessment: Reviewed RN Assessment (Ecchymosis, MASD, Left posterior calf with partial thickness wound; 4X4X.1cm,)  Last BM:  9/21- TYPE 6  Height:   Ht Readings from Last 1 Encounters:  10/13/19 _0  (1.727 m)    Weight:   Wt Readings from Last 1 Encounters:  10/18/19 128.6 kg    Ideal Body Weight:  70 kg  BMI:  Body mass index is 43.11 kg/m.  Estimated Nutritional Needs:   Kcal:  2500-2800kcal/day  Protein:  >125g/day  Fluid:  1.5L/day  Koleen Distance MS, RD, LDN Please refer to [facility] for RD and/or RD on-call/weekend/after hours pager

## 2019-10-18 NOTE — Progress Notes (Signed)
Patient Name: RUFUS BESKE Date of Encounter: 10/18/2019  Hospital Problem List     Principal Problem:   (HFpEF) heart failure with preserved ejection fraction (HCC) Active Problems:   Diabetes mellitus ([place])   Benign essential HTN   Benign prostatic hyperplasia with urinary obstruction   Chronic atrial fibrillation (HCC)   Morbid (severe) obesity due to excess calories (HCC)   NASH (nonalcoholic steatohepatitis)   Bilateral carotid artery stenosis   CKD stage 3 due to type 2 diabetes mellitus (HCC)   Acute exacerbation of CHF (congestive heart failure) (HCC)   Leg wound, left, initial encounter   C. difficile colitis    Patient Profile     69 year old male with history of diabetes, chronic atrial fibrillation NASH, chronic kidney disease admitted with shortness of breath and heart failure. Abd/pelvic ct showed small left pleural effusion, mild ascites, cirrhosis with probable portal hypertension and possible colitis.   Subjective   Complains of back and buttock pain.   Inpatient Medications    . sodium chloride   Intravenous Once  . Chlorhexidine Gluconate Cloth  6 each Topical Daily  . hydrocerin   Topical Daily  . insulin aspart  0-15 Units Subcutaneous TID WC  . insulin glargine  10 Units Subcutaneous QHS  . metoprolol tartrate  50 mg Oral BID  . pantoprazole (PROTONIX) IV  40 mg Intravenous Q12H  . potassium chloride  40 mEq Oral TID WC  . pravastatin  20 mg Oral q1800  . sodium chloride flush  3 mL Intravenous Q12H  . vancomycin  125 mg Oral QID    Vital Signs    Vitals:   10/18/19 0315 10/18/19 0345 10/18/19 0721 10/18/19 1121  BP:   120/67 (!) 141/84  Pulse:   (!) 125 (!) 124  Resp: 17 15 18 18   Temp:   99.6 F (37.6 C) 99.1 F (37.3 C)  TempSrc:   Oral Oral  SpO2:   94% 95%  Weight:      Height:        Intake/Output Summary (Last 24 hours) at 10/18/2019 1329 Last data filed at 10/18/2019 0600 Gross per 24 hour  Intake 370 ml  Output 1175 ml   Net -805 ml   Filed Weights   10/15/19 0354 10/16/19 0537 10/18/19 0310  Weight: 128.7 kg 130.4 kg 128.6 kg    Physical Exam      HEENT: normal.  Neck: Supple, no JVD, carotid bruits, or masses. Cardiac: irr, irr  Respiratory:  Respirations regular and unlabored, decreased breath sounds at the base GI: tender to palpation. MS: no deformity or atrophy. Skin: warm and dry,. Neuro:  Strength and sensation are intact. Psych: Normal affect.  Labs    CBC Recent Labs    10/17/19 0614 10/18/19 0636  WBC 14.9* 15.8*  HGB 9.0* 8.6*  HCT 26.8* 26.6*  MCV 93.7 96.7  PLT 225 789   Basic Metabolic Panel Recent Labs    10/17/19 0614 10/18/19 0636  NA 133* 133*  K 3.4* 3.6  CL 91* 93*  CO2 33* 30  GLUCOSE 192* 145*  BUN 30* 26*  CREATININE 1.45* 1.38*  CALCIUM 8.0* 7.8*  MG 2.0 2.0   Liver Function Tests Recent Labs    10/16/19 1842 10/17/19 0614  AST 41 34  ALT 16 15  ALKPHOS 97 85  BILITOT 1.2 1.4*  1.5*  PROT 6.9 6.4*  ALBUMIN 2.3* 2.2*   Recent Labs    10/17/19 0614  LIPASE  38   Cardiac Enzymes No results for input(s): CKTOTAL, CKMB, CKMBINDEX, TROPONINI in the last 72 hours. BNP Recent Labs    10/16/19 1843 10/17/19 0614  BNP 451.9* 445.1*   D-Dimer No results for input(s): DDIMER in the last 72 hours. Hemoglobin A1C No results for input(s): HGBA1C in the last 72 hours. Fasting Lipid Panel No results for input(s): CHOL, HDL, LDLCALC, TRIG, CHOLHDL, LDLDIRECT in the last 72 hours. Thyroid Function Tests No results for input(s): TSH, T4TOTAL, T3FREE, THYROIDAB in the last 72 hours.  Invalid input(s): FREET3  Telemetry    Afib with rvr  ECG    afib with rvr. No ischemia  Radiology    DG Chest 1 View  Result Date: 10/12/2019 CLINICAL DATA:  Shortness of breath. EXAM: CHEST  1 VIEW COMPARISON:  October 08, 2019 FINDINGS: There is a dual lead AICD. Mild diffusely increased interstitial lung markings are seen the which is predominant  stable in severity when compared to the prior exam. Mild to moderate severity prominence of the perihilar pulmonary vasculature is noted which represents a new finding. There is no evidence of a pleural effusion or pneumothorax. Stable moderate to marked severity enlargement of the cardiac silhouette is seen. A radiopaque fixation plate and multiple fixation screws are seen within the proximal left humerus. IMPRESSION: 1. Stable cardiomegaly with mild to moderate severity pulmonary vascular congestion. Electronically Signed   By: Virgina Norfolk M.D.   On: 10/12/2019 02:13   DG Chest 2 View  Result Date: 10/08/2019 CLINICAL DATA:  Shortness of breath, open wounds to the posterior left lower leg and anterior right lower leg, history of asthma EXAM: CHEST - 2 VIEW COMPARISON:  Radiograph 08/08/2019, CT 09/15/2015 FINDINGS: Diffuse hazy basilar predominant interstitial opacities with some fissural thickening as well. More patchy retrocardiac infrahilar opacities in the lung bases. No pneumothorax or visible effusion. Prominent cardiac silhouette compatible with cardiomegaly seen on comparison studies. Pacer pack overlies the left chest wall with leads at the cardiac apex and right atrium in similar positioning to prior. Remaining cardiomediastinal contours are unremarkable. No acute osseous or soft tissue abnormality. Degenerative changes are present in the imaged spine and shoulders. Prior left humeral ORIF is noted. IMPRESSION: 1. Features of CHF/volume overload with cardiomegaly and interstitial opacities suggesting edema. 2. More patchy retrocardiac and infrahilar opacities in the lung bases may reflect developing alveolar edema, atelectasis or airspace disease. Electronically Signed   By: Lovena Le M.D.   On: 10/08/2019 17:36   DG Tibia/Fibula Left  Result Date: 10/08/2019 CLINICAL DATA:  Open wound to posterior left lower leg. EXAM: LEFT TIBIA AND FIBULA - 2 VIEW COMPARISON:  None. FINDINGS: There is  no evidence of fracture or other focal bone lesions. Mild diffuse soft tissue swelling is seen throughout the left calf with moderate severity soft tissue swelling also noted along the anterior aspect of the left tibia. No soft tissue air is seen. IMPRESSION: 1. Soft tissue swelling which may be secondary to diffuse cellulitis, without evidence of an acute osseous abnormality. Electronically Signed   By: Virgina Norfolk M.D.   On: 10/08/2019 19:49   CT ABDOMEN PELVIS W CONTRAST  Result Date: 10/17/2019 CLINICAL DATA:  Upper abdominal pain. EXAM: CT ABDOMEN AND PELVIS WITH CONTRAST TECHNIQUE: Multidetector CT imaging of the abdomen and pelvis was performed using the standard protocol following bolus administration of intravenous contrast. CONTRAST:  <See Chart> OMNIPAQUE IOHEXOL 300 MG/ML SOLN, 31m OMNIPAQUE IOHEXOL 350 MG/ML SOLN COMPARISON:  August 04, 2019 FINDINGS: Lower chest: There is a small left-sided pleural effusion. There is bibasilar atelectasis.The heart is enlarged. Hepatobiliary: The liver is cirrhotic. Normal gallbladder.There is no biliary ductal dilation. Pancreas: Normal contours without ductal dilatation. No peripancreatic fluid collection. Spleen: Unremarkable. Adrenals/Urinary Tract: --Adrenal glands: Unremarkable. --Right kidney/ureter: No hydronephrosis or radiopaque kidney stones. --Left kidney/ureter: No hydronephrosis or radiopaque kidney stones. --Urinary bladder: The bladder is decompressed by Foley catheter and therefore is poorly evaluated. Stomach/Bowel: --Stomach/Duodenum: There is a surgical clip within the proximal duodenum. --Small bowel: Unremarkable. --Colon: There is mild diffuse circumferential wall thickening of the sigmoid and descending colon. There is a metallic foreign body at the level of the proximal descending colon which may be related to recent intervention. --Appendix: Surgically absent. Vascular/Lymphatic: Atherosclerotic calcification is present within the  non-aneurysmal abdominal aorta, without hemodynamically significant stenosis. There is evidence for portal hypertension with recanalization of the umbilical vein in addition to extensive portosystemic shunting. The portal vein appears patent. The SMV is patent. The splenic vein remains patent. There may be some small esophageal varices. --No retroperitoneal lymphadenopathy. --No mesenteric lymphadenopathy. --No pelvic or inguinal lymphadenopathy. Reproductive: Unremarkable Other: There is a small volume of abdominal ascites. The abdominal wall is normal. Musculoskeletal. No acute displaced fractures. IMPRESSION: 1. Mild diffuse circumferential wall thickening of the sigmoid and descending colon may be secondary to underdistention or mild colitis. 2. Cirrhosis with stigmata of portal hypertension including recanalization of the umbilical vein and extensive portosystemic shunting. 3. Small volume abdominal ascites. 4. Small left-sided pleural effusion with bibasilar atelectasis. 5. Cardiomegaly. Aortic Atherosclerosis (ICD10-I70.0). Electronically Signed   By: Constance Holster M.D.   On: 10/17/2019 16:41   DG Chest Port 1 View  Result Date: 10/16/2019 CLINICAL DATA:  Hypertension, CHF EXAM: PORTABLE CHEST 1 VIEW COMPARISON:  10/12/2019 FINDINGS: Mild bilateral interstitial thickening. No pleural effusion or pneumothorax. No focal consolidation. Stable cardiomegaly. Dual lead cardiac pacemaker. No acute osseous abnormality. IMPRESSION: Cardiomegaly with mild pulmonary vascular congestion. Electronically Signed   By: Kathreen Devoid   On: 10/16/2019 15:37    Assessment & Plan    Principal Problem:   (HFpEF) heart failure with preserved ejection fraction (HCC) Active Problems:   Diabetes mellitus ([place])   Benign essential HTN   Benign prostatic hyperplasia with urinary obstruction   Chronic atrial fibrillation (HCC)   Morbid (severe) obesity due to excess calories (HCC)   NASH (nonalcoholic  steatohepatitis)   Bilateral carotid artery stenosis   CKD stage 3 due to type 2 diabetes mellitus ([place])   Acute exacerbation of CHF (congestive heart failure) (HCC)   Leg wound, left, initial encounter     1. Acute on chronic diastolic CHF,EF 26-94%; presenting with weight gain, progressive exertional dyspnea, peripheral edema, and orthopnea, with anasarc. Off of the lasix drip and will resume iv lasix once daily following hemodynamics and I/o.  Chest x-ray showed cardiomegaly with mild pulmonary vascular congestion.   Has diuresed 4.5 L since admission.  Creatinine slowly  improved from yesterday down to 1.38. K improved to 3.6.  2. Chronic atrial fibrillation, on metoprolol tartrate 50 mg twice daily for rate control, warfarin on hold due to bleeding. Elevated ventricular rate to 110s-120s bpm , RVR likely compensatory secondary to GI bleed and anemia, patient asymptomatic. 3. Anemia, hemoglobin 8.6 , underwent colonoscopy earlier during his hospitalization which revealed bleeding visible vessel second portion of duodenum underwent hemostatic clips 5. NASH,   Recommendations   1.  Will hold lasix for now. [name].Will continue  with metoprolol  50 mg bid 2.  Continue diuresis following hemodynamics and electrolytes.  3.  Carefully monitor renal status 4.  Monitor daily weights and  I&O's 5.  Defer further cardiac diagnostics at this time 6.  Follow-up with Dr. Nehemiah Massed after discharge    Signed, Javier Docker. Shallyn Constancio MD 10/18/2019, 1:29 PM  Pager: [phone]

## 2019-10-18 NOTE — Progress Notes (Signed)
Patient has been having alot of watery liquid stool this morning, I understand with staff that this has not improved even when he has had a rectal tube in, not since yesterday. My concern is his whole buttocks and rectum area is very excoriated and painful to clean. Should have someone come see him and assess his bottom. Thanks

## 2019-10-19 DIAGNOSIS — A0472 Enterocolitis due to Clostridium difficile, not specified as recurrent: Secondary | ICD-10-CM

## 2019-10-19 LAB — GLUCOSE, CAPILLARY
Glucose-Capillary: 113 mg/dL — ABNORMAL HIGH (ref 70–99)
Glucose-Capillary: 169 mg/dL — ABNORMAL HIGH (ref 70–99)
Glucose-Capillary: 274 mg/dL — ABNORMAL HIGH (ref 70–99)
Glucose-Capillary: 177 mg/dL — ABNORMAL HIGH (ref 70–99)

## 2019-10-19 LAB — BASIC METABOLIC PANEL
Anion gap: 10 (ref 5–15)
BUN: 25 mg/dL — ABNORMAL HIGH (ref 8–23)
CO2: 30 mmol/L (ref 22–32)
Calcium: 8 mg/dL — ABNORMAL LOW (ref 8.9–10.3)
Chloride: 97 mmol/L — ABNORMAL LOW (ref 98–111)
Creatinine, Ser: 1.36 mg/dL — ABNORMAL HIGH (ref 0.61–1.24)
GFR calc Af Amer: >60 mL/min (ref >60)
GFR calc non Af Amer: 52 mL/min — ABNORMAL LOW (ref >60)
Glucose, Bld: 113 mg/dL — ABNORMAL HIGH (ref 70–99)
Potassium: 3.8 mmol/L (ref 3.5–5.1)
Sodium: 137 mmol/L (ref 135–145)

## 2019-10-19 LAB — CBC
HCT: 26.2 % — ABNORMAL LOW (ref 39.0–52.0)
Hemoglobin: 8.7 g/dL — ABNORMAL LOW (ref 13.0–17.0)
MCH: 31.4 pg (ref 26.0–34.0)
MCHC: 33.2 g/dL (ref 30.0–36.0)
MCV: 94.6 fL (ref 80.0–100.0)
Platelets: 277 10*3/uL (ref 150–400)
RBC: 2.77 MIL/uL — ABNORMAL LOW (ref 4.22–5.81)
RDW: 20.9 % — ABNORMAL HIGH (ref 11.5–15.5)
WBC: 11.5 10*3/uL — ABNORMAL HIGH (ref 4.0–10.5)
nRBC: 0 % (ref 0.0–0.2)

## 2019-10-19 LAB — PROTIME-INR
INR: 1.7 — ABNORMAL HIGH (ref 0.8–1.2)
Prothrombin Time: 19.3 seconds — ABNORMAL HIGH (ref 11.4–15.2)

## 2019-10-19 LAB — MAGNESIUM: Magnesium: 2.2 mg/dL (ref 1.7–2.4)

## 2019-10-19 MED ORDER — ALBUMIN HUMAN 25 % IV SOLN
25.0000 g | Freq: Once | INTRAVENOUS | Status: AC
  Administered 2019-10-19: 25 g via INTRAVENOUS
  Filled 2019-10-19: qty 100

## 2019-10-19 MED ORDER — FENTANYL CITRATE (PF) 100 MCG/2ML IJ SOLN
12.5000 ug | Freq: Once | INTRAMUSCULAR | Status: AC
  Administered 2019-10-19: 12.5 ug via INTRAVENOUS
  Filled 2019-10-19: qty 2

## 2019-10-19 MED ORDER — DICYCLOMINE HCL 10 MG PO CAPS
10.0000 mg | ORAL_CAPSULE | Freq: Four times a day (QID) | ORAL | Status: DC | PRN
  Administered 2019-10-20: 10 mg via ORAL
  Filled 2019-10-19 (×4): qty 1

## 2019-10-19 MED ORDER — ACETAMINOPHEN 325 MG PO TABS: 650.0000 mg | ORAL_TABLET | ORAL | Status: DC | PRN

## 2019-10-19 NOTE — Progress Notes (Signed)
Patient Name: Christian Berg Date of Encounter: 10/19/2019  Hospital Problem List     Principal Problem:   (HFpEF) heart failure with preserved ejection fraction (HCC) Active Problems:   Diabetes mellitus ([place])   Benign essential HTN   Benign prostatic hyperplasia with urinary obstruction   Chronic atrial fibrillation (HCC)   Morbid (severe) obesity due to excess calories (HCC)   NASH (nonalcoholic steatohepatitis)   Bilateral carotid artery stenosis   CKD stage 3 due to type 2 diabetes mellitus (HCC)   Acute exacerbation of CHF (congestive heart failure) (HCC)   Leg wound, left, initial encounter   C. difficile colitis    Patient Profile     68 year old male with history of diabetes, chronic atrial fibrillation NASH, chronic kidney disease admitted with shortness of breath and heart failure. Abd/pelvic ct showed small left pleural effusion, mild ascites, cirrhosis with probable portal hypertension and possible colitis. Still with liquid diarrhea. Also has excoriation on buttocks and lower extremeties. No positive of C. Diff.  Subjective  Mentation ok this morning. States his discomfort on buttocks is a little less. Relatively hypotensive this am Heart rate afib ranging vr from 90-120.   Inpatient Medications    . sodium chloride   Intravenous Once  . Chlorhexidine Gluconate Cloth  6 each Topical Daily  . feeding supplement (NEPRO CARB STEADY)  237 mL Oral BID BM  . Gerhardt's butt cream   Topical TID  . hydrocerin   Topical Daily  . insulin aspart  0-15 Units Subcutaneous TID WC  . insulin glargine  10 Units Subcutaneous QHS  . metoprolol tartrate  50 mg Oral BID  . multivitamin with minerals  1 tablet Oral Daily  . pantoprazole (PROTONIX) IV  40 mg Intravenous Q12H  . potassium chloride  40 mEq Oral TID WC  . pravastatin  20 mg Oral q1800  . sodium chloride flush  3 mL Intravenous Q12H  . vancomycin  125 mg Oral QID    Vital Signs    Vitals:   10/18/19 2200  10/19/19 0000 10/19/19 0200 10/19/19 0400  BP: 103/62 (!) 97/59 102/66 110/63  Pulse: 92  (!) 105 (!) 112  Resp:      Temp: 98.8 F (37.1 C) 98.6 F (37 C)  98.5 F (36.9 C)  TempSrc: Oral Oral  Oral  SpO2:      Weight:      Height:        Intake/Output Summary (Last 24 hours) at 10/19/2019 0759 Last data filed at 10/18/2019 1740 Gross per 24 hour  Intake --  Output 500 ml  Net -500 ml   Filed Weights   10/15/19 0354 10/16/19 0537 10/18/19 0310  Weight: 128.7 kg 130.4 kg 128.6 kg    Physical Exam    GEN: Well nourished, well developed, in no acute distress.  HEENT: normal.  Neck: Supple, no JVD, carotid bruits, or masses. Cardiac:  Irr, irr Bilateral lower extremity edema.   Respiratory:  Respirations regular and unlabored, clear to auscultation bilaterally. GI: Soft, nontender, nondistended, BS + x 4. MS: no deformity or atrophy. Skin: excoriation lower extremities  Neuro:  Strength and sensation are intact. Psych: Normal affect.  Labs    CBC Recent Labs    10/17/19 0614 10/18/19 0636  WBC 14.9* 15.8*  HGB 9.0* 8.6*  HCT 26.8* 26.6*  MCV 93.7 96.7  PLT 225 224   Basic Metabolic Panel Recent Labs    10/17/19 0614 10/18/19 0636  NA 133*  133*  K 3.4* 3.6  CL 91* 93*  CO2 33* 30  GLUCOSE 192* 145*  BUN 30* 26*  CREATININE 1.45* 1.38*  CALCIUM 8.0* 7.8*  MG 2.0 2.0   Liver Function Tests Recent Labs    10/17/19 0614 10/18/19 0628  AST 34 35  ALT 15 16  ALKPHOS 85 90  BILITOT 1.4*  1.5* 1.4*  PROT 6.4* 6.2*  ALBUMIN 2.2* 2.1*   Recent Labs    10/17/19 0614  LIPASE 38   Cardiac Enzymes No results for input(s): CKTOTAL, CKMB, CKMBINDEX, TROPONINI in the last 72 hours. BNP Recent Labs    10/16/19 1843 10/17/19 0614  BNP 451.9* 445.1*   D-Dimer No results for input(s): DDIMER in the last 72 hours. Hemoglobin A1C No results for input(s): HGBA1C in the last 72 hours. Fasting Lipid Panel No results for input(s): CHOL, HDL, LDLCALC,  TRIG, CHOLHDL, LDLDIRECT in the last 72 hours. Thyroid Function Tests No results for input(s): TSH, T4TOTAL, T3FREE, THYROIDAB in the last 72 hours.  Invalid input(s): FREET3  Telemetry    afib with variable vr  ECG    afib with rvr.   Radiology    DG Chest 1 View  Result Date: 10/12/2019 CLINICAL DATA:  Shortness of breath. EXAM: CHEST  1 VIEW COMPARISON:  October 08, 2019 FINDINGS: There is a dual lead AICD. Mild diffusely increased interstitial lung markings are seen the which is predominant stable in severity when compared to the prior exam. Mild to moderate severity prominence of the perihilar pulmonary vasculature is noted which represents a new finding. There is no evidence of a pleural effusion or pneumothorax. Stable moderate to marked severity enlargement of the cardiac silhouette is seen. A radiopaque fixation plate and multiple fixation screws are seen within the proximal left humerus. IMPRESSION: 1. Stable cardiomegaly with mild to moderate severity pulmonary vascular congestion. Electronically Signed   By: Virgina Norfolk M.D.   On: 10/12/2019 02:13   DG Chest 2 View  Result Date: 10/08/2019 CLINICAL DATA:  Shortness of breath, open wounds to the posterior left lower leg and anterior right lower leg, history of asthma EXAM: CHEST - 2 VIEW COMPARISON:  Radiograph 08/08/2019, CT 09/15/2015 FINDINGS: Diffuse hazy basilar predominant interstitial opacities with some fissural thickening as well. More patchy retrocardiac infrahilar opacities in the lung bases. No pneumothorax or visible effusion. Prominent cardiac silhouette compatible with cardiomegaly seen on comparison studies. Pacer pack overlies the left chest wall with leads at the cardiac apex and right atrium in similar positioning to prior. Remaining cardiomediastinal contours are unremarkable. No acute osseous or soft tissue abnormality. Degenerative changes are present in the imaged spine and shoulders. Prior left humeral  ORIF is noted. IMPRESSION: 1. Features of CHF/volume overload with cardiomegaly and interstitial opacities suggesting edema. 2. More patchy retrocardiac and infrahilar opacities in the lung bases may reflect developing alveolar edema, atelectasis or airspace disease. Electronically Signed   By: Lovena Le M.D.   On: 10/08/2019 17:36   DG Tibia/Fibula Left  Result Date: 10/08/2019 CLINICAL DATA:  Open wound to posterior left lower leg. EXAM: LEFT TIBIA AND FIBULA - 2 VIEW COMPARISON:  None. FINDINGS: There is no evidence of fracture or other focal bone lesions. Mild diffuse soft tissue swelling is seen throughout the left calf with moderate severity soft tissue swelling also noted along the anterior aspect of the left tibia. No soft tissue air is seen. IMPRESSION: 1. Soft tissue swelling which may be secondary to diffuse cellulitis, without  evidence of an acute osseous abnormality. Electronically Signed   By: Virgina Norfolk M.D.   On: 10/08/2019 19:49   CT ABDOMEN PELVIS W CONTRAST  Result Date: 10/17/2019 CLINICAL DATA:  Upper abdominal pain. EXAM: CT ABDOMEN AND PELVIS WITH CONTRAST TECHNIQUE: Multidetector CT imaging of the abdomen and pelvis was performed using the standard protocol following bolus administration of intravenous contrast. CONTRAST:  <See Chart> OMNIPAQUE IOHEXOL 300 MG/ML SOLN, 7m OMNIPAQUE IOHEXOL 350 MG/ML SOLN COMPARISON:  August 04, 2019 FINDINGS: Lower chest: There is a small left-sided pleural effusion. There is bibasilar atelectasis.The heart is enlarged. Hepatobiliary: The liver is cirrhotic. Normal gallbladder.There is no biliary ductal dilation. Pancreas: Normal contours without ductal dilatation. No peripancreatic fluid collection. Spleen: Unremarkable. Adrenals/Urinary Tract: --Adrenal glands: Unremarkable. --Right kidney/ureter: No hydronephrosis or radiopaque kidney stones. --Left kidney/ureter: No hydronephrosis or radiopaque kidney stones. --Urinary bladder: The bladder  is decompressed by Foley catheter and therefore is poorly evaluated. Stomach/Bowel: --Stomach/Duodenum: There is a surgical clip within the proximal duodenum. --Small bowel: Unremarkable. --Colon: There is mild diffuse circumferential wall thickening of the sigmoid and descending colon. There is a metallic foreign body at the level of the proximal descending colon which may be related to recent intervention. --Appendix: Surgically absent. Vascular/Lymphatic: Atherosclerotic calcification is present within the non-aneurysmal abdominal aorta, without hemodynamically significant stenosis. There is evidence for portal hypertension with recanalization of the umbilical vein in addition to extensive portosystemic shunting. The portal vein appears patent. The SMV is patent. The splenic vein remains patent. There may be some small esophageal varices. --No retroperitoneal lymphadenopathy. --No mesenteric lymphadenopathy. --No pelvic or inguinal lymphadenopathy. Reproductive: Unremarkable Other: There is a small volume of abdominal ascites. The abdominal wall is normal. Musculoskeletal. No acute displaced fractures. IMPRESSION: 1. Mild diffuse circumferential wall thickening of the sigmoid and descending colon may be secondary to underdistention or mild colitis. 2. Cirrhosis with stigmata of portal hypertension including recanalization of the umbilical vein and extensive portosystemic shunting. 3. Small volume abdominal ascites. 4. Small left-sided pleural effusion with bibasilar atelectasis. 5. Cardiomegaly. Aortic Atherosclerosis (ICD10-I70.0). Electronically Signed   By: CConstance HolsterM.D.   On: 10/17/2019 16:41   DG Chest Port 1 View  Result Date: 10/16/2019 CLINICAL DATA:  Hypertension, CHF EXAM: PORTABLE CHEST 1 VIEW COMPARISON:  10/12/2019 FINDINGS: Mild bilateral interstitial thickening. No pleural effusion or pneumothorax. No focal consolidation. Stable cardiomegaly. Dual lead cardiac pacemaker. No acute  osseous abnormality. IMPRESSION: Cardiomegaly with mild pulmonary vascular congestion. Electronically Signed   By: HKathreen Devoid  On: 10/16/2019 15:37    Assessment & Plan    Principal Problem: (HFpEF) heart failure with preserved ejection fraction (HCC) Active Problems: Diabetes mellitus ([place] Benign essential HTN Benign prostatic hyperplasia with urinary obstruction Chronic atrial fibrillation (HCC) Morbid (severe) obesity due to excess calories (HCC) NASH (nonalcoholic steatohepatitis) Bilateral carotid artery stenosis CKD stage 3 due to type 2 diabetes mellitus ([place] Acute exacerbation of CHF (congestive heart failure) (HCC) Leg wound, left, initial encounter   1.Acute on chronic diastolic CHF,EF 654-62%;[id number] weight gain, progressive exertional dyspnea, peripheral edema, and orthopnea, with anasarc.Off of thelasix drip andwill resume iv lasixonce dailyfollowing hemodynamics and I/o.Chest x-ray showed cardiomegaly with mild pulmonary vascular congestion. Has diuresed 4.5L since admission. Creatinineslowlyimproved from yesterday down to 1.38 yesterday. Value pending today. .K improved to 3.6.  2.Chronic atrial fibrillation, on metoprolol tartrate50 mg twice daily for rate control, warfarin on hold due tobleeding.Elevated ventricular rate to 110s-120s bpm ,RVR likely compensatorysecondary to GI bleed and  anemia,patient asymptomatic. 3.Anemia,hemoglobin 8.6, underwent colonoscopy earlier during his hospitalization which revealed bleeding visible vessel second portion of duodenum underwent hemostatic clips 5. NASH,  Recommendations  1.Holding lasix due to diarrhea. Renal function improved. Willcontinue withmetoprolol 50 mg bid 2.Continue diuresisfollowing hemodynamics and electrolytes.  3.Carefully monitor renal status 4.Monitor daily weights andI&O's 5.Defer further cardiac diagnostics at this  time 6.Follow-up with Dr. Nehemiah Massed after discharge  Signed, Javier Docker. Maressa Apollo MD 10/19/2019, 7:59 AM  Pager: [phone]

## 2019-10-19 NOTE — Progress Notes (Signed)
   10/18/19 2018  Assess: MEWS Score  Temp 98.8 F (37.1 C)  BP 124/65  Pulse Rate (!) 126  SpO2 96 %  O2 Device Room Air  Assess: MEWS Score  MEWS Temp 0  MEWS Systolic 0  MEWS Pulse 2  MEWS RR 0  MEWS LOC 0  MEWS Score 2  MEWS Score Color Yellow  Assess: if the MEWS score is Yellow or Red  Were vital signs taken at a resting state? Yes  Focused Assessment No change from prior assessment  Early Detection of Sepsis Score *See Row Information* High  MEWS guidelines implemented *See Row Information* Yes  Treat  MEWS Interventions Administered scheduled meds/treatments;Administered prn meds/treatments  Take Vital Signs  Increase Vital Sign Frequency  Yellow: Q 2hr X 2 then Q 4hr X 2, if remains yellow, continue Q 4hrs  Document  Patient Outcome Stabilized after interventions;Other (Comment) (HR decreased)  Progress note created (see row info) Yes

## 2019-10-19 NOTE — Progress Notes (Signed)
Cross cover Note Nurse reports patient with 9/10 chest pain and ongoing abdomina; pain.Bedside patient states chest pain new but ongoing severe abdominal pain (actively groaning during assessment)  Atrial fib with ventricular rate 130s and soft blood pressures. Last chem panel without concerns. Creatinine noted to be improving despite documented negative fluid balance and urine output has been adequate. Has ongoing poor oral intake per nurse.  Albumin significantly low at 2.2.  Dose of albumin ordered for soft pressures as patient is also due for his metoprolol now.  Checking troponin. One time dose pf fentanyl in addition to bentyl ordered for his abdominal cramping.  Nitro not ordered as of yet as chest pain likely referred pain from abdominal cramping

## 2019-10-19 NOTE — Progress Notes (Signed)
   10/19/19 1545  Assess: MEWS Score  Temp 98.7 F (37.1 C)  BP 126/70  Pulse Rate (!) 115  Resp 20  Level of Consciousness Alert  SpO2 100 %  O2 Device Room Air  Assess: MEWS Score  MEWS Temp 0  MEWS Systolic 0  MEWS Pulse 2  MEWS RR 0  MEWS LOC 0  MEWS Score 2  MEWS Score Color Yellow  Assess: if the MEWS score is Yellow or Red  Were vital signs taken at a resting state? Yes  Focused Assessment No change from prior assessment  Early Detection of Sepsis Score *See Row Information* Medium  MEWS guidelines implemented *See Row Information* Yes  Treat  MEWS Interventions Administered prn meds/treatments;Other (Comment) (repositioned and applied cream to buttocks)  Pain Scale 0-10  Pain Score 8  Pain Type Acute pain  Pain Location Buttocks  Pain Descriptors / Indicators Burning  Pain Intervention(s) Repositioned;Other (Comment) (cream applied to buttocks)  Take Vital Signs  Increase Vital Sign Frequency  Yellow: Q 2hr X 2 then Q 4hr X 2, if remains yellow, continue Q 4hrs  Escalate  MEWS: Escalate Yellow: discuss with charge nurse/RN and consider discussing with provider and RRT  Notify: Charge Nurse/RN  Name of Charge Nurse/RN Notified Tammy  Date Charge Nurse/RN Notified 10/19/19  Time Charge Nurse/RN Notified 1753  Document  Progress note created (see row info) Yes

## 2019-10-19 NOTE — Progress Notes (Signed)
Physical Therapy Treatment Patient Details Name: Christian Berg MRN: [id number] DOB: 02-09-1950 Today's Date: 10/19/2019    History of Present Illness Pt is a 68 y.o. male presenting to hospital 9/11 with worsening orthopnea, exertional SOB, worsening LE edema, and developing wounds B LE's.  Pt admitted with acute exacerbation of diastolic CHF and leg wounds; also c/o L LE pain.  PMH inlcudes a-fib, CHF, DM, carotid stenosis, htn, pacemaker, PVD, and TIA.    PT Comments    PT/OT co-treatment.  Prior to getting OOB, pt noted with bowel incontinence requiring clean-up prior to getting OOB.  Pt then able to sit up on edge of bed and then stand up to RW with assist but then pt with bowel incontinence requiring clean-up (pt sat down and then stood up to finish getting cleaned-up).  After getting cleaned up in standing, pt able to ambulate a few feet bed to recliner with 2 assist and RW use.  Pt's bottom noted to be sore during session requiring repositioning in sitting positions.  Vitals taken during session (see below for details).  Will continue to focus on strengthening and progressive functional mobility per pt tolerance.   Follow Up Recommendations  SNF     Equipment Recommendations   (pt has RW at home already)    Recommendations for Other Services       Precautions / Restrictions Precautions Precautions: Fall Restrictions Weight Bearing Restrictions: No    Mobility  Bed Mobility Overal bed mobility: Needs Assistance Bed Mobility: Supine to Sit;Rolling Rolling: Min assist   Supine to sit: Supervision;Min guard     General bed mobility comments: increased effort/time to perform; pt came to partial sitting (leaning onto L UE) before eventually coming to sitting with feet on floor; SBA for management of foley catheter  Transfers Overall transfer level: Needs assistance Equipment used: Rolling walker (2 wheeled) Transfers: Sit to/from Stand Sit to Stand: Min assist;Min  guard;+2 physical assistance         General transfer comment: x2 trials standing from bed; increased effort to stand; vc's for UE/LE placement  Ambulation/Gait Ambulation/Gait assistance: Min guard;Min assist;+2 physical assistance Gait Distance (Feet): 3 Feet (bed to recliner) Assistive device: Rolling walker (2 wheeled)   Gait velocity: decreased   General Gait Details: decreased B LE step length/foot clearance; increased UE support on RW   Stairs             Wheelchair Mobility    Modified Rankin (Stroke Patients Only)       Balance Overall balance assessment: Needs assistance Sitting-balance support: No upper extremity supported;Feet supported Sitting balance-Leahy Scale: Good Sitting balance - Comments: steady sitting reaching within BOS   Standing balance support: Bilateral upper extremity supported Standing balance-Leahy Scale: Fair Standing balance comment: increased UE support required on RW with standing activities                            Cognition Arousal/Alertness: Awake/alert Behavior During Therapy: WFL for tasks assessed/performed;Flat affect (pt making jokes at times but mostly flat) Overall Cognitive Status: Within Functional Limits for tasks assessed                                        Exercises Other Exercises Other Exercises: OT/PT co-session.  Units divided per protocols for co-tx session.    General Comments General comments (  skin integrity, edema, etc.): Resting BP supine in bed 106/61; sitting BP 139/85 with HR 117 bpm; standing BP 112/65  (mild dizziness initially standing but resolved sitting edge of bed).  BP at end of session (sitting in recliner) 126/70 with HR 129 bpm (OT notified RN).      Pertinent Vitals/Pain Pain Assessment: 0-10 Pain Score: 8  Pain Location: abdominal pain Pain Descriptors / Indicators: Cramping;Grimacing;Throbbing Pain Intervention(s): Limited activity within patient's  tolerance;Monitored during session;Repositioned    Home Living                      Prior Function            PT Goals (current goals can now be found in the care plan section) Acute Rehab PT Goals Patient Stated Goal: to improve strength and mobility PT Goal Formulation: With patient Time For Goal Achievement: 10/28/19 Potential to Achieve Goals: Good Progress towards PT goals: Progressing toward goals    Frequency    Min 2X/week      PT Plan Current plan remains appropriate    Co-evaluation PT/OT/SLP Co-Evaluation/Treatment: Yes Reason for Co-Treatment: Complexity of the patient's impairments (multi-system involvement);For patient/therapist safety;To address functional/ADL transfers PT goals addressed during session: Mobility/safety with mobility;Balance;Proper use of DME OT goals addressed during session: ADL's and self-care;Proper use of Adaptive equipment and DME      AM-PAC PT "6 Clicks" Mobility   Outcome Measure  Help needed turning from your back to your side while in a flat bed without using bedrails?: A Little Help needed moving from lying on your back to sitting on the side of a flat bed without using bedrails?: A Little Help needed moving to and from a bed to a chair (including a wheelchair)?: A Little Help needed standing up from a chair using your arms (e.g., wheelchair or bedside chair)?: A Little Help needed to walk in hospital room?: A Lot Help needed climbing 3-5 steps with a railing? : A Lot 6 Click Score: 16    End of Session Equipment Utilized During Treatment: Gait belt Activity Tolerance: Patient limited by fatigue Patient left: in chair;with call bell/phone within reach;with chair alarm set Nurse Communication: Mobility status;Precautions;Other (comment) (NT notified pt requesting ice chips and needing bed linen change) PT Visit Diagnosis: Other abnormalities of gait and mobility (R26.89);Muscle weakness (generalized)  (M62.81);History of falling (Z91.81);Pain Pain - Right/Left: Left Pain - part of body: Leg     Time: 1410-1450 PT Time Calculation (min) (ACUTE ONLY): 40 min  Charges:  $Therapeutic Activity: 8-22 mins                    Leitha Bleak, PT 10/19/19, 4:34 PM

## 2019-10-19 NOTE — Hospital Course (Addendum)
HPI per Dr. Gala Romney on 10/09/19 "Christian Berg is a 69 y.o. male with medical history significant of diabetes, hypertension, diastolic dysfunction CHF, disseminated superficial actinic porokeratosis, degenerative disc disease, recent bleeding episodes, atrial fibrillation and peripheral vascular disease who presented to the ER with left lower extremity ulcer.  He already has a right lower extremity ulcer.  He came in for wound check.  Patient has noted increasing weight about 25 pounds more in the last few weeks.  He is taking diuretics at home.  He also has noted increasing shortness of breath.  He has bruising easily and was told to stop his Coumadin about a week ago.  Patient was seen in the ER in addition to his leg wounds was found to be having episode of anasarca.  Appears he has acute exacerbation of his diastolic CHF so he is being admitted to the hospital for further evaluation and treatment..   ED Course: Temperature is 99.1 blood pressure 130/47 pulse 140 respirate 26 oxygen sats 94% on room air.  White count 9.0 hemoglobin 9.3 and platelet 235.  Chemistry appears to be within normal except for creatinine 1.38 and calcium 8.4.  Glucose 145.  COVID-19 screen is negative.  Chest x-ray showed evidence of mild fluid overload.  X-ray of the tibia-fibula shows no evidence of osseous abnormality.  Patient being admitted with acute exacerbation of CHF and also possible infected wound."     Lower extremity cellulitis treated with Cefepime. Developed diarrhea initially attributed to antibiotic.  Diuresed with IV Lasix, including infusion.  Cardiology consulted.  Lasix eventually stopped due to soft Bp's.    GI Bleeding with positive FOBT.  GI consulted.  EGD on 9/16 showed actively bleeding duodenal ulcer which was clipped.  Received 1 unit pRBC transfusion. Hemoglobin has since remained stable.   9/19-20 - increasing frequency of watery stools.  Patient pan-cultured and checked for C diff  which returned positive.  Started on oral vancomycin.

## 2019-10-19 NOTE — Progress Notes (Signed)
PROGRESS NOTE    Christian Berg   ZHY:[id number]  DOB: [birth year]  PCP: Birdie Sons, MD    DOA: 10/08/2019 LOS: 11   Brief Narrative   HPI per Dr. Gala Romney on 10/09/19 "Christian Berg is a 69 y.o. male with medical history significant of diabetes, hypertension, diastolic dysfunction CHF, disseminated superficial actinic porokeratosis, degenerative disc disease, recent bleeding episodes, atrial fibrillation and peripheral vascular disease who presented to the ER with left lower extremity ulcer.  He already has a right lower extremity ulcer.  He came in for wound check.  Christian Berg has noted increasing weight about 25 pounds more in the last few weeks.  He is taking diuretics at home.  He also has noted increasing shortness of breath.  He has bruising easily and was told to stop his Coumadin about a week ago.  Christian Berg was seen in the ER in addition to his leg wounds was found to be having episode of anasarca.  Appears he has acute exacerbation of his diastolic CHF so he is being admitted to the hospital for further evaluation and treatment..   ED Course: Temperature is 99.1 blood pressure 130/47 pulse 140 respirate 26 oxygen sats 94% on room air.  White count 9.0 hemoglobin 9.3 and platelet 235.  Chemistry appears to be within normal except for creatinine 1.38 and calcium 8.4.  Glucose 145.  COVID-19 screen is negative.  Chest x-ray showed evidence of mild fluid overload.  X-ray of the tibia-fibula shows no evidence of osseous abnormality.  Christian Berg being admitted with acute exacerbation of CHF and also possible infected wound."     Lower extremity cellulitis treated with Cefepime. Developed diarrhea initially attributed to antibiotic.  Diuresed with IV Lasix, including infusion.  Cardiology consulted.  Lasix eventually stopped due to soft Bp's.    GI Bleeding with positive FOBT.  GI consulted.  EGD on 9/16 showed actively bleeding duodenal ulcer which was clipped.  Received 1 unit  pRBC transfusion. Hemoglobin has since remained stable.   9/19-20 - increasing frequency of watery stools.  Christian Berg pan-cultured and checked for C diff which returned positive.  Started on oral vancomycin.           Assessment & Plan   Principal Problem:   (HFpEF) heart failure with preserved ejection fraction (HCC) Active Problems:   Diabetes mellitus (HCC)   Benign essential HTN   Benign prostatic hyperplasia with urinary obstruction   Chronic atrial fibrillation (HCC)   Morbid (severe) obesity due to excess calories (HCC)   NASH (nonalcoholic steatohepatitis)   Bilateral carotid artery stenosis   CKD stage 3 due to type 2 diabetes mellitus (HCC)   Acute exacerbation of CHF (congestive heart failure) (HCC)   Leg wound, left, initial encounter   C. difficile colitis   No changes today to assessment & plan by Dr. Si Raider of 9/21 as follows:  C. Diff colitis 9/20 reporting upper abdominal pain, mild tenderness to palpation, and increased watery stool output. WBC slow up-trend. CT showing mild colitis. C diff positive and started on oral vancomycin, at this point infection appears to be mild. Hemodynamically stable this AM - continue oral vancomycin  Acute on Chronic Diastolic Congestive Heart Failure. -Christian Berg presented with evidence of bichamber congestive heart failure. Severe volume overload with anasarca. -BNP on admission was 399.1 -Reviewed Christian Berg most recent echocardiogram performed in 08/05/2019, ejection fraction was normal. -No significant pulmonary hypertension. - Off O2 - lasix per cardiology, is now off lasix gtt - held  due to soft BP's -Continues to have evidence of volume overload but unclear to what extent LE edema is cardiac-mediated - cont incentive spirometry  Left leg wound with cellulitis in the setting of DSAP Resolved, treated  Bleeding duodenal lesion Acute blood loss anemia EGD 9/16, actively bleeding lesion clipped.  gi pathogen panel  negative. hgb stable since clipping and transfusion 1 U transfused 9/16, pt tolerated well. Another unit transfused 9/19 - appreciate GI recs. Now s/p 5 days ceftriaxone for sbp ppx - H/H stable, will transfuse to keep H above 8 (today's hgb is pending  Chronic Atrial fibrillation with rapid ventricular response. -Christian Berg was taken off of warfarin recently due to concern of skin bleeding. Christian Berg has some tachycardia, continue beta-blocker. -INR prior to admission was >5, now < 2 - holding on anticoagulation given active bleed  Hypokalemia - 3.4 this am, mg wnl - increase kcl to 40 tid  Type 2 Diabetes Mellitus -Continue to Hold Metformin -Continue sliding scale insulin. CBGs upper 100s - cont lantus 10 units nightly  NASH liver cirrhosis. -Continue to monitor. - LFTs stable. T billi mildly elevated, will check fractionated. No RUQ pain/tenderness, no n/v  Chronic kidney disease stage IIIa. Stable  EssentialHypertension. Stable   Morbid Obesity -Estimated body mass index is 43.11 kg/m as calculated from the following:   Height as of this encounter: 5' 8"  (1.727 m).   Weight as of this encounter: 128.6 kg. -Weight Loss and Dietary Counseling given   "  Morbid Obesity - Body mass index is 43.11 kg/m.   Complicates overall care and prognosis.    DVT prophylaxis: Place TED hose Start: 10/13/19 1600 SCDs Start: 10/09/19 0223   Diet:  Diet Orders (From admission, onward)    Start     Ordered   10/18/19 1501  Diet 2 gram sodium Room service appropriate? Yes; Fluid consistency: Thin; Fluid restriction: 1200 mL Fluid  Diet effective now       Question Answer Comment  Room service appropriate? Yes   Fluid consistency: Thin   Fluid restriction: 1200 mL Fluid      10/18/19 1501            Code Status: Full Code    Subjective 10/19/19    Christian Berg seen this AM during Christian Berg care.  He had had 4 watery BM's in just the past 30 minutes or so.  No  fever/chills.  Endorses some mild abdominal pain, no nausea/vomiting.   Disposition Plan & Communication   Status is: Inpatient  Remains inpatient appropriate because:Inpatient level of care appropriate due to severity of illness  With ongoing frequent watery diarrhea due to C diff.  Dispo: The Christian Berg is from: Home              Anticipated d/c is to: Home              Anticipated d/c date is: 2 days              Christian Berg currently is not medically stable to d/c.    Family Communication: none at bedside, will attempt to call    Consults, Procedures, Significant Events   Consultants:   Cardiology  Gastroenterology  Procedures:   EGD 10/13/19   Antimicrobials:  Anti-infectives (From admission, onward)   Start     Dose/Rate Route Frequency Ordered Stop   10/17/19 1800  vancomycin (VANCOCIN) 50 mg/mL oral solution 125 mg        125 mg Oral 4 times daily  10/17/19 1708 10/27/19 1759   10/16/19 1400  cefTRIAXone (ROCEPHIN) 1 g in sodium chloride 0.9 % 100 mL IVPB        1 g 200 mL/hr over 30 Minutes Intravenous Every 24 hours 10/15/19 1508 10/17/19 0850   10/12/19 1400  cefTRIAXone (ROCEPHIN) 2 g in sodium chloride 0.9 % 100 mL IVPB  Status:  Discontinued        2 g 200 mL/hr over 30 Minutes Intravenous Every 24 hours 10/12/19 1327 10/15/19 1508   10/11/19 2100  ceFEPIme (MAXIPIME) 2 g in sodium chloride 0.9 % 100 mL IVPB  Status:  Discontinued        2 g 200 mL/hr over 30 Minutes Intravenous Every 8 hours 10/10/19 1853 10/10/19 1859   10/10/19 1900  ceFEPIme (MAXIPIME) 2 g in sodium chloride 0.9 % 100 mL IVPB  Status:  Discontinued        2 g 200 mL/hr over 30 Minutes Intravenous Every 8 hours 10/10/19 1859 10/12/19 1215   10/09/19 0000  ceFEPIme (MAXIPIME) 2 g in sodium chloride 0.9 % 100 mL IVPB  Status:  Discontinued        2 g 200 mL/hr over 30 Minutes Intravenous Every 8 hours 10/08/19 2346 10/10/19 1853         Objective   Vitals:   10/19/19 0900 10/19/19 1100  10/19/19 1105 10/19/19 1209  BP: 118/62 (!) 102/59 (!) 106/46 114/68  Pulse: 94 (!) 116 (!) 103 100  Resp: 18 18 18 15   Temp: 98.2 F (36.8 C) 98.4 F (36.9 C)  98.3 F (36.8 C)  TempSrc: Oral   Oral  SpO2: 95% 98% 97% 99%  Weight:      Height:        Intake/Output Summary (Last 24 hours) at 10/19/2019 1431 Last data filed at 10/19/2019 0900 Gross per 24 hour  Intake 230 ml  Output 500 ml  Net -270 ml   Filed Weights   10/15/19 0354 10/16/19 0537 10/18/19 0310  Weight: 128.7 kg 130.4 kg 128.6 kg    Physical Exam:  General exam: awake, alert, no acute distress, obese Respiratory system: CTAB, normal respiratory effort. Cardiovascular system: normal S1/S2, RRR, no pedal edema.   Gastrointestinal system: soft, mild LLQ tenderness with guarding, no rebound tenderness, hyperactive bowel sounds. Central nervous system: A&O x3. no gross focal neurologic deficits, normal speech Extremities: moves all, no cyanosis, normal tone Psychiatry: normal mood, congruent affect, judgement and insight appear normal  Labs   Data Reviewed: I have personally reviewed following labs and imaging studies  CBC: Recent Labs  Lab 10/16/19 0439 10/16/19 0439 10/16/19 1843 10/16/19 2301 10/17/19 0614 10/18/19 0636 10/19/19 0701  WBC 12.9*  --  8.0  --  14.9* 15.8* 11.5*  HGB 7.5*   < > 13.8 8.4* 9.0* 8.6* 8.7*  HCT 23.1*   < > 42.9 25.9* 26.8* 26.6* 26.2*  MCV 95.5  --  95.8  --  93.7 96.7 94.6  PLT 215  --  154  --  225 260 277   < > = values in this interval not displayed.   Basic Metabolic Panel: Recent Labs  Lab 10/15/19 0608 10/15/19 0608 10/16/19 0439 10/16/19 1842 10/17/19 0614 10/18/19 0636 10/19/19 0701  NA 135   < > 134* 131* 133* 133* 137  K 3.2*   < > 3.3* 3.5 3.4* 3.6 3.8  CL 93*   < > 92* 88* 91* 93* 97*  CO2 33*   < >  33* 32 33* 30 30  GLUCOSE 175*   < > 205* 238* 192* 145* 113*  BUN 37*   < > 31* 32* 30* 26* 25*  CREATININE 1.48*   < > 1.45* 1.46* 1.45* 1.38*  1.36*  CALCIUM 7.7*   < > 7.8* 7.9* 8.0* 7.8* 8.0*  MG 2.0  --  2.0  --  2.0 2.0 2.2   < > = values in this interval not displayed.   GFR: Estimated Creatinine Clearance: 66.1 mL/min (A) (by C-G formula based on SCr of 1.36 mg/dL (H)). Liver Function Tests: Recent Labs  Lab 10/15/19 0608 10/16/19 0439 10/16/19 1842 10/17/19 0614 10/18/19 0628  AST 42* 41 41 34 35  ALT 15 17 16 15 16   ALKPHOS 84 88 97 85 90  BILITOT 1.2 1.1 1.2 1.4*  1.5* 1.4*  PROT 6.3* 6.4* 6.9 6.4* 6.2*  ALBUMIN 2.1* 2.2* 2.3* 2.2* 2.1*   Recent Labs  Lab 10/17/19 0614  LIPASE 38   No results for input(s): AMMONIA in the last 168 hours. Coagulation Profile: Recent Labs  Lab 10/16/19 0439 10/16/19 1842 10/17/19 0614 10/18/19 0636 10/19/19 0701  INR 1.6* 1.6* 1.7* 1.8* 1.7*   Cardiac Enzymes: No results for input(s): CKTOTAL, CKMB, CKMBINDEX, TROPONINI in the last 168 hours. BNP (last 3 results) No results for input(s): PROBNP in the last 8760 hours. HbA1C: No results for input(s): HGBA1C in the last 72 hours. CBG: Recent Labs  Lab 10/18/19 1123 10/18/19 1610 10/18/19 2021 10/19/19 0749 10/19/19 1207  GLUCAP 127* 155* 141* 113* 169*   Lipid Profile: No results for input(s): CHOL, HDL, LDLCALC, TRIG, CHOLHDL, LDLDIRECT in the last 72 hours. Thyroid Function Tests: No results for input(s): TSH, T4TOTAL, FREET4, T3FREE, THYROIDAB in the last 72 hours. Anemia Panel: No results for input(s): VITAMINB12, FOLATE, FERRITIN, TIBC, IRON, RETICCTPCT in the last 72 hours. Sepsis Labs: Recent Labs  Lab 10/16/19 1843  LATICACIDVEN 2.4*    Recent Results (from the past 240 hour(s))  Gastrointestinal Panel by PCR , Stool     Status: None   Collection Time: 10/11/19  4:07 PM   Specimen: Stool  Result Value Ref Range Status   Campylobacter species NOT DETECTED NOT DETECTED Final   Plesimonas shigelloides NOT DETECTED NOT DETECTED Final   Salmonella species NOT DETECTED NOT DETECTED Final    Yersinia enterocolitica NOT DETECTED NOT DETECTED Final   Vibrio species NOT DETECTED NOT DETECTED Final   Vibrio cholerae NOT DETECTED NOT DETECTED Final   Enteroaggregative E coli (EAEC) NOT DETECTED NOT DETECTED Final   Enteropathogenic E coli (EPEC) NOT DETECTED NOT DETECTED Final   Enterotoxigenic E coli (ETEC) NOT DETECTED NOT DETECTED Final   Shiga like toxin producing E coli (STEC) NOT DETECTED NOT DETECTED Final   Shigella/Enteroinvasive E coli (EIEC) NOT DETECTED NOT DETECTED Final   Cryptosporidium NOT DETECTED NOT DETECTED Final   Cyclospora cayetanensis NOT DETECTED NOT DETECTED Final   Entamoeba histolytica NOT DETECTED NOT DETECTED Final   Giardia lamblia NOT DETECTED NOT DETECTED Final   Adenovirus F40/41 NOT DETECTED NOT DETECTED Final   Astrovirus NOT DETECTED NOT DETECTED Final   Norovirus GI/GII NOT DETECTED NOT DETECTED Final   Rotavirus A NOT DETECTED NOT DETECTED Final   Sapovirus (I, II, IV, and V) NOT DETECTED NOT DETECTED Final    Comment: Performed at [facility], [address] [place], [place] [address]  CULTURE, BLOOD (ROUTINE X 2) w Reflex to ID Panel  Status: None (Preliminary result)   Collection Time: 10/16/19 12:21 PM   Specimen: BLOOD  Result Value Ref Range Status   Specimen Description BLOOD [facility]  Final   Special Requests   Final    BOTTLES DRAWN AEROBIC AND ANAEROBIC Blood Culture adequate volume   Culture   Final    NO GROWTH 3 DAYS Performed at [facility], [address], [place], [place] [address]    Report Status PENDING  Incomplete  CULTURE, BLOOD (ROUTINE X 2) w Reflex to ID Panel     Status: None (Preliminary result)   Collection Time: 10/16/19 12:32 PM   Specimen: BLOOD  Result Value Ref Range Status   Specimen Description BLOOD [facility]  Final   Special Requests   Final    BOTTLES DRAWN AEROBIC ONLY Blood Culture adequate volume   Culture   Final    NO GROWTH 3 DAYS Performed at [facility], [address], [place], [place] [address]    Report Status PENDING  Incomplete  Urine Culture     Status: None   Collection Time: 10/16/19  3:34 PM   Specimen: Urine, Random  Result Value Ref Range Status   Specimen Description   Final    URINE, RANDOM Performed at [facility], [address], [place], [place] [address]    Special Requests   Final    NONE Performed at [facility], [address] [place], [name] [address]    Culture   Final    NO GROWTH Performed at [facility], [place] [address] [place], [place] [address]    Report Status 10/17/2019 FINAL  Final  C Difficile Quick Screen (NO PCR Reflex)     Status: Abnormal   Collection Time: 10/17/19  8:15 PM   Specimen: STOOL  Result Value Ref Range Status   C Diff antigen POSITIVE (A) NEGATIVE Final   C Diff toxin POSITIVE (A) NEGATIVE Final   C Diff interpretation Toxin producing C. difficile detected.  Final    Comment: CRITICAL RESULT CALLED TO, READ BACK BY AND VERIFIED WITH: DENISE [facility] 10/17/19 AT 2127 BY ACR Performed at [facility], [address], [place],  [address]       Imaging Studies   CT ABDOMEN PELVIS W CONTRAST  Result Date: 10/17/2019 CLINICAL DATA:  Upper abdominal pain. EXAM: CT ABDOMEN AND PELVIS WITH CONTRAST TECHNIQUE: Multidetector CT imaging of the abdomen and pelvis was performed using the standard protocol following bolus administration of intravenous contrast. CONTRAST:  <See Chart> OMNIPAQUE IOHEXOL 300 MG/ML SOLN, 34m OMNIPAQUE IOHEXOL 350 MG/ML SOLN COMPARISON:  August 04, 2019 FINDINGS: Lower chest: There is a small left-sided pleural effusion. There is bibasilar atelectasis.The heart is enlarged. Hepatobiliary: The liver is cirrhotic. Normal gallbladder.There is no biliary ductal dilation. Pancreas: Normal contours without ductal dilatation. No peripancreatic fluid collection. Spleen: Unremarkable. Adrenals/Urinary Tract: --Adrenal glands:  Unremarkable. --Right kidney/ureter: No hydronephrosis or radiopaque kidney stones. --Left kidney/ureter: No hydronephrosis or radiopaque kidney stones. --Urinary bladder: The bladder is decompressed by Foley catheter and therefore is poorly evaluated. Stomach/Bowel: --Stomach/Duodenum: There is a surgical clip within the proximal duodenum. --Small bowel: Unremarkable. --Colon: There is mild diffuse circumferential wall thickening of the sigmoid and descending colon. There is a metallic foreign body at the level of the proximal descending colon which may be related to recent intervention. --Appendix: Surgically absent. Vascular/Lymphatic: Atherosclerotic calcification is present within the non-aneurysmal abdominal aorta, without hemodynamically significant stenosis. There is  evidence for portal hypertension with recanalization of the umbilical vein in addition to extensive portosystemic shunting. The portal vein appears patent. The SMV is patent. The splenic vein remains patent. There may be some small esophageal varices. --No retroperitoneal lymphadenopathy. --No mesenteric lymphadenopathy. --No pelvic or inguinal lymphadenopathy. Reproductive: Unremarkable Other: There is a small volume of abdominal ascites. The abdominal wall is normal. Musculoskeletal. No acute displaced fractures. IMPRESSION: 1. Mild diffuse circumferential wall thickening of the sigmoid and descending colon may be secondary to underdistention or mild colitis. 2. Cirrhosis with stigmata of portal hypertension including recanalization of the umbilical vein and extensive portosystemic shunting. 3. Small volume abdominal ascites. 4. Small left-sided pleural effusion with bibasilar atelectasis. 5. Cardiomegaly. Aortic Atherosclerosis (ICD10-I70.0). Electronically Signed   By: Constance Holster M.D.   On: 10/17/2019 16:41     Medications   Scheduled Meds: . sodium chloride   Intravenous Once  . Chlorhexidine Gluconate Cloth  6 each Topical  Daily  . feeding supplement (NEPRO CARB STEADY)  237 mL Oral BID BM  . Gerhardt's butt cream   Topical TID  . hydrocerin   Topical Daily  . insulin aspart  0-15 Units Subcutaneous TID WC  . insulin glargine  10 Units Subcutaneous QHS  . metoprolol tartrate  50 mg Oral BID  . multivitamin with minerals  1 tablet Oral Daily  . pantoprazole (PROTONIX) IV  40 mg Intravenous Q12H  . potassium chloride  40 mEq Oral TID WC  . pravastatin  20 mg Oral q1800  . sodium chloride flush  3 mL Intravenous Q12H  . vancomycin  125 mg Oral QID   Continuous Infusions: . sodium chloride         LOS: 11 days    Time spent: 30 minutes    Ezekiel Slocumb, DO [facility]  10/19/2019, 2:31 PM    If 7PM-7AM, please contact night-coverage. How to contact the [facility] Attending or Consulting provider [place] or covering provider during after hours [place], for this Christian Berg?    1. Check the care team in [facility] and look for a) attending/consulting [facility] provider listed and b) the [facility] team listed 2. Log into [email or web address] and use [place]'s universal password to access. If you do not have the password, please contact the hospital operator. 3. Locate the [facility] provider you are looking for under [facility] and page to a number that you can be directly reached. 4. If you still have difficulty reaching the provider, please page the [facility] [place] [facility] (Director on Call) for the Hospitalists listed on amion for assistance.

## 2019-10-19 NOTE — Progress Notes (Signed)
Occupational Therapy Treatment Patient Details Name: Christian Berg MRN: [id number] DOB: [birth year] Today's Date: 10/19/2019    History of present illness Pt is a 69 y.o. male presenting to hospital 9/11 with worsening orthopnea, exertional SOB, worsening LE edema, and developing wounds B LE's.  Pt admitted with acute exacerbation of diastolic CHF and leg wounds; also c/o L LE pain.  PMH inlcudes a-fib, CHF, DM, carotid stenosis, htn, pacemaker, PVD, and TIA.   OT comments  [name] was seen for OT/PT co-treatment on this date. Upon arrival to room pt semi-supine in bed, easily awoken. Pt agreeable to treatment session. Pt noted with generally flat affect, but otherwise is A&O t/o session. Follows VCs appropriately. Pt noted to be soiled with loose BM and requires assist for clean up at bed level. OT facilitates bed level peri-care and bed mobility, pt requires MAX A for peri-care this date. Once pt cleaned he is able to come to sitting at EOB. Pt vitals monitored t/o session and recorded as follows: Resting HR 106/61 with pt supine at start of session, upon sitting, pt BP is 139/85; HR 117; In standing pt BP is112/65 pt endorses mild dizziness upon initial standing and sits back on EOB after episode of BM/incontenence, once dizziness resolved, pt assisted with STS peri-care (MAX A for peri-care, Min A for STS +2) and able to transfer to room recliner with +1 CGA. BP at end of session noed to be 126/70 HR 129 RN notified/aware. Pt making good progress toward goals and continues to benefit from skilled OT services to maximize return to PLOF and minimize risk of future falls, injury, caregiver burden, and readmission. Will continue to follow POC as written. Discharge recommendation remains appropriate.    Follow Up Recommendations  SNF    Equipment Recommendations  3 in 1 bedside commode (BARI BSC)    Recommendations for Other Services      Precautions / Restrictions Precautions Precautions:  Fall Restrictions Weight Bearing Restrictions: No       Mobility Bed Mobility Overal bed mobility: Needs Assistance Bed Mobility: Supine to Sit     Supine to sit: Supervision;Min guard     General bed mobility comments: Pt requires increased time/effort to perform sup>sit, comes to partial sitting at EOB leaning onto LUE for ~30 seconds before coming to full sit with feet on the floor. He requires SBA for management of foley tube, but no physical assist from therapist.  Transfers Overall transfer level: Needs assistance Equipment used: Rolling walker (2 wheeled) Transfers: Sit to/from Stand Sit to Stand: Min assist;Min guard;+2 physical assistance         General transfer comment: +2 for STS x2 this date. Pt requires prompting for hand/foot placement.    Balance Overall balance assessment: Needs assistance Sitting-balance support: No upper extremity supported;Feet supported Sitting balance-Leahy Scale: Good Sitting balance - Comments: steady sitting reaching within BOS   Standing balance support: Bilateral upper extremity supported Standing balance-Leahy Scale: Fair Standing balance comment: requires BUE support on RW during standing this date.                           ADL either performed or assessed with clinical judgement   ADL Overall ADL's : Needs assistance/impaired                           Toilet Transfer Details (indicate cue type and reason): Pt bed level  for toileting needs 2/2 constant runny diarrhea this date.   Toileting - Clothing Manipulation Details (indicate cue type and reason): Pt bed level for toileting needs 2/2 constant runny diarrhea this date. He requires bed level peri care management after x2 episodes of bowel incontenence during session. He is able to perform rolling side to side and STS with +2 assist this date. MAX A for peri-care/clean up. Pt also noted with foley catheter.     Functional mobility during ADLs: +2  for physical assistance;Minimal assistance;Moderate assistance;Rolling walker       Vision Patient Visual Report: No change from baseline     Perception     Praxis      Cognition Arousal/Alertness: Awake/alert Behavior During Therapy: WFL for tasks assessed/performed;Flat affect (makes jokes however majority flat affect) Overall Cognitive Status: Within Functional Limits for tasks assessed                                          Exercises Other Exercises Other Exercises: OT/PT facilitate toileting/peri-care management x2 during session, bed mobility including rolling and sup>sit t/f, STS trials x2 for functional mobility and peri-care management, and functional transfer to room recliner. Units divided per protocols for co-tx session.   Shoulder Instructions       General Comments Pt vitals monitored t/o session. Resting HR noted to be 106/61 with pt supine at start of session, upon sitting, pt BP is 139/85; HR 117; In standing pt BP is112/65 pt endorses mild dizziness upon initial standing and sits back on EOB, once dizziness resolved, pt able to transfer to room recliner. BP at end of session noed to be 126/70 HR 129 RN notified/aware.    Pertinent Vitals/ Pain       Pain Assessment: 0-10 Pain Score: 8  Pain Location: abdominal pain Pain Descriptors / Indicators: Cramping;Grimacing;Throbbing Pain Intervention(s): Limited activity within patient's tolerance;Monitored during session;Repositioned  Home Living                                          Prior Functioning/Environment              Frequency  Min 1X/week        Progress Toward Goals  OT Goals(current goals can now be found in the care plan section)  Progress towards OT goals: Progressing toward goals  Acute Rehab OT Goals Patient Stated Goal: to improve strength and mobility OT Goal Formulation: With patient Time For Goal Achievement: 10/29/19 Potential to Achieve  Goals: Good  Plan Discharge plan remains appropriate;Frequency remains appropriate    Co-evaluation    PT/OT/SLP Co-Evaluation/Treatment: Yes Reason for Co-Treatment: Complexity of the patient's impairments (multi-system involvement);For patient/therapist safety;To address functional/ADL transfers PT goals addressed during session: Mobility/safety with mobility;Balance;Proper use of DME OT goals addressed during session: ADL's and self-care;Proper use of Adaptive equipment and DME      AM-PAC OT "6 Clicks" Daily Activity     Outcome Measure   Help from another person eating meals?: A Little Help from another person taking care of personal grooming?: A Little Help from another person toileting, which includes using toliet, bedpan, or urinal?: A Lot Help from another person bathing (including washing, rinsing, drying)?: A Lot Help from another person to put on and taking off regular upper body  clothing?: A Little Help from another person to put on and taking off regular lower body clothing?: A Lot 6 Click Score: 15    End of Session Equipment Utilized During Treatment: Gait belt;Rolling walker  OT Visit Diagnosis: Muscle weakness (generalized) (M62.81);Other abnormalities of gait and mobility (R26.89)   Activity Tolerance Patient tolerated treatment well   Patient Left in chair;with call bell/phone within reach;Other (comment) (With PT in room to finish up session.)   Nurse Communication Mobility status;Other (comment) (Pt vitals during session; pt bedding soiled after episodes runny BM)        Time: 2500-3704 OT Time Calculation (min): 39 min  Charges: OT General Charges $OT Visit: 1 Visit OT Treatments $Self Care/Home Management : 23-37 mins  Shara Blazing, M.S., OTR/L Ascom: [phone] 10/19/19, 4:03 PM

## 2019-10-20 LAB — GLUCOSE, CAPILLARY
Glucose-Capillary: 145 mg/dL — ABNORMAL HIGH (ref 70–99)
Glucose-Capillary: 187 mg/dL — ABNORMAL HIGH (ref 70–99)
Glucose-Capillary: 201 mg/dL — ABNORMAL HIGH (ref 70–99)
Glucose-Capillary: 201 mg/dL — ABNORMAL HIGH (ref 70–99)

## 2019-10-20 LAB — BASIC METABOLIC PANEL
Anion gap: 7 (ref 5–15)
BUN: 25 mg/dL — ABNORMAL HIGH (ref 8–23)
CO2: 30 mmol/L (ref 22–32)
Calcium: 8.2 mg/dL — ABNORMAL LOW (ref 8.9–10.3)
Chloride: 96 mmol/L — ABNORMAL LOW (ref 98–111)
Creatinine, Ser: 1.62 mg/dL — ABNORMAL HIGH (ref 0.61–1.24)
GFR calc Af Amer: 49 mL/min — ABNORMAL LOW (ref >60)
GFR calc non Af Amer: 42 mL/min — ABNORMAL LOW (ref >60)
Glucose, Bld: 158 mg/dL — ABNORMAL HIGH (ref 70–99)
Potassium: 4.1 mmol/L (ref 3.5–5.1)
Sodium: 133 mmol/L — ABNORMAL LOW (ref 135–145)

## 2019-10-20 LAB — CBC
HCT: 25.3 % — ABNORMAL LOW (ref 39.0–52.0)
Hemoglobin: 8 g/dL — ABNORMAL LOW (ref 13.0–17.0)
MCH: 30.9 pg (ref 26.0–34.0)
MCHC: 31.6 g/dL (ref 30.0–36.0)
MCV: 97.7 fL (ref 80.0–100.0)
Platelets: 255 10*3/uL (ref 150–400)
RBC: 2.59 MIL/uL — ABNORMAL LOW (ref 4.22–5.81)
RDW: 20.6 % — ABNORMAL HIGH (ref 11.5–15.5)
WBC: 7.8 10*3/uL (ref 4.0–10.5)
nRBC: 0 % (ref 0.0–0.2)

## 2019-10-20 LAB — MAGNESIUM: Magnesium: 2.4 mg/dL (ref 1.7–2.4)

## 2019-10-20 LAB — PROTIME-INR
INR: 1.7 — ABNORMAL HIGH (ref 0.8–1.2)
Prothrombin Time: 19.3 seconds — ABNORMAL HIGH (ref 11.4–15.2)

## 2019-10-20 LAB — TROPONIN I (HIGH SENSITIVITY)
Troponin I (High Sensitivity): 15 ng/L (ref <18)
Troponin I (High Sensitivity): 16 ng/L (ref <18)
Troponin I (High Sensitivity): 16 ng/L (ref <18)

## 2019-10-20 MED ORDER — POTASSIUM CHLORIDE CRYS ER 20 MEQ PO TBCR
40.0000 meq | EXTENDED_RELEASE_TABLET | Freq: Two times a day (BID) | ORAL | Status: DC
  Administered 2019-10-20: 40 meq via ORAL
  Filled 2019-10-20: qty 2

## 2019-10-20 MED ORDER — POTASSIUM CHLORIDE 2 MEQ/ML IV SOLN
INTRAVENOUS | Status: DC
  Filled 2019-10-20 (×4): qty 1000

## 2019-10-20 NOTE — Progress Notes (Addendum)
PROGRESS NOTE    Christian Berg   HXT:[id number]  DOB: May 09, 1950  PCP: Birdie Sons, MD    DOA: 10/08/2019 LOS: 12   Brief Narrative   HPI per Dr. Gala Berg on 10/09/19 "Christian Berg is a 69 y.o. male with medical history significant of diabetes, hypertension, diastolic dysfunction CHF, disseminated superficial actinic porokeratosis, degenerative disc disease, recent bleeding episodes, atrial fibrillation and peripheral vascular disease who presented to the ER with left lower extremity ulcer.  He already has a right lower extremity ulcer.  He came in for wound check.  Patient has noted increasing weight about 25 pounds more in the last few weeks.  He is taking diuretics at home.  He also has noted increasing shortness of breath.  He has bruising easily and was told to stop his Coumadin about a week ago.  Patient was seen in the ER in addition to his leg wounds was found to be having episode of anasarca.  Appears he has acute exacerbation of his diastolic CHF so he is being admitted to the hospital for further evaluation and treatment..   ED Course: Temperature is 99.1 blood pressure 130/47 pulse 140 respirate 26 oxygen sats 94% on room air.  White count 9.0 hemoglobin 9.3 and platelet 235.  Chemistry appears to be within normal except for creatinine 1.38 and calcium 8.4.  Glucose 145.  COVID-19 screen is negative.  Chest x-ray showed evidence of mild fluid overload.  X-ray of the tibia-fibula shows no evidence of osseous abnormality.  Patient being admitted with acute exacerbation of CHF and also possible infected wound."     Lower extremity cellulitis treated with Cefepime. Developed diarrhea initially attributed to antibiotic.  Diuresed with IV Lasix, including infusion.  Cardiology consulted.  Lasix eventually stopped due to soft Bp's.    GI Bleeding with positive FOBT.  GI consulted.  EGD on 9/16 showed actively bleeding duodenal ulcer which was clipped.  Received 1 unit  pRBC transfusion. Hemoglobin has since remained stable.   9/19-20 - increasing frequency of watery stools.  Patient pan-cultured and checked for C diff which returned positive.  Started on oral vancomycin.           Assessment & Plan   Principal Problem:   (HFpEF) heart failure with preserved ejection fraction (HCC) Active Problems:   Diabetes mellitus (HCC)   Benign essential HTN   Benign prostatic hyperplasia with urinary obstruction   Chronic atrial fibrillation (HCC)   Morbid (severe) obesity due to excess calories (HCC)   NASH (nonalcoholic steatohepatitis)   Bilateral carotid artery stenosis   CKD stage 3 due to type 2 diabetes mellitus (HCC)   Acute exacerbation of CHF (congestive heart failure) (HCC)   Leg wound, left, initial encounter   C. difficile colitis   C. Diff colitis - on PO Vancomycin (9/20>> ).  Continues to have frequent large volume diarrhea. 9/20 reported upper abdominal pain, mild tenderness to palpation, and increased watery stool output, rising white count.  CT abdomen/pelvis showed mild colitis.  C diff returned positive.  Hemodynamically stable. - continue oral vancomycin  AKI superimposed on CKD stage IIIa - AKI not present on admission, secondary to diarrhea/hypovolemia with C diff. Cr 1.36 >> 1.62 today.  Will run gentle IV fluids and monitor closely (given he presented volume overloaded). Monitor BMP.  Hold lisinopril, spironolactone, Lasix  Acute on Chronic Diastolic Congestive Heart Failure - presented with severe volume overload with anasarca. BNP on admission was 399.1.  Prior  echo from 08/05/2019, EF normal.  --Cardiology consulted --Was on Lasix infusion.  Now off Lasix due to soft BP's and diarrhea.   --Monitor volume status   Acute blood loss anemia secondary to Duodenal ulcer - EGD 9/16, actively bleeding lesion clipped.  Hbg stable since clipping and transfusions of RBC's on 9/16 and 9/19.   1 U transfused 9/16, pt tolerated well.  Another unit transfused 9/19 --Completed  5 days ceftriaxone for sbp ppx --monitor Hbg, transfuse as needed if Hbg <7 --page GI if recurrent bleeding   Left leg wound with cellulitis in the setting of DSAP - Resolved, treated   Chronic Atrial fibrillation with rapid ventricular response - now rate controlled.   --Patient taken off of warfarin recently due to concern of skin bleeding --holding on anticoagulation given GI bleed this admission   Hypokalemia - recurrent due to diarrhea.  Replacing with IV fluid additive.  Will give more PO as needed. Maintain K>4.0, Mg>2.0.  Type 2 Diabetes Mellitus - Hold Metformin.  Sliding scale Novolog.  Lantus 10 units qHS.  NASH liver cirrhosis - LFT's stable, mild Tbili elevation.  No acute issues.  Monitor.  EssentialHypertension - chronic, stable.  Continue Lopressor BID.  Monitor BP and maintain MAP>65. Hold lisinopril, spironolactone, Lasix   Morbid Obesity - Body mass index is 42.18 kg/m.   Complicates overall care and prognosis.  Counseled regarding lifestyle modifications including diet and physical activity.    DVT prophylaxis: Place TED hose Start: 10/13/19 1600 SCDs Start: 10/09/19 0223   Diet:  Diet Orders (From admission, onward)    Start     Ordered   10/18/19 1501  Diet 2 gram sodium Room service appropriate? Yes; Fluid consistency: Thin; Fluid restriction: 1200 mL Fluid  Diet effective now       Question Answer Comment  Room service appropriate? Yes   Fluid consistency: Thin   Fluid restriction: 1200 mL Fluid      10/18/19 1501            Code Status: Full Code    Subjective 10/20/19    Patient seen this AM during patient care, wife present.  Patient reports mild abdominal pain and nausea without vomiting.  Continues to have frequent diarrhea.  Wife reported patient called him last night at 10 pm upset due to waiting a long time to be cleaned up.  His bottom is very red and tender, painful during clean  up.     Disposition Plan & Communication   Status is: Inpatient  Remains inpatient appropriate because:Inpatient level of care appropriate due to severity of illness  With ongoing frequent watery diarrhea due to C diff.  Dispo: The patient is from: Home              Anticipated d/c is to: Home              Anticipated d/c date is: 2 days              Patient currently is not medically stable to d/c.    Family Communication: wife at bedside during encounter today 9/23.   Consults, Procedures, Significant Events   Consultants:   Cardiology  Gastroenterology  Procedures:   EGD 10/13/19   Antimicrobials:  Anti-infectives (From admission, onward)   Start     Dose/Rate Route Frequency Ordered Stop   10/17/19 1800  vancomycin (VANCOCIN) 50 mg/mL oral solution 125 mg        125 mg Oral 4 times daily  10/17/19 1708 10/27/19 1759   10/16/19 1400  cefTRIAXone (ROCEPHIN) 1 g in sodium chloride 0.9 % 100 mL IVPB        1 g 200 mL/hr over 30 Minutes Intravenous Every 24 hours 10/15/19 1508 10/17/19 0850   10/12/19 1400  cefTRIAXone (ROCEPHIN) 2 g in sodium chloride 0.9 % 100 mL IVPB  Status:  Discontinued        2 g 200 mL/hr over 30 Minutes Intravenous Every 24 hours 10/12/19 1327 10/15/19 1508   10/11/19 2100  ceFEPIme (MAXIPIME) 2 g in sodium chloride 0.9 % 100 mL IVPB  Status:  Discontinued        2 g 200 mL/hr over 30 Minutes Intravenous Every 8 hours 10/10/19 1853 10/10/19 1859   10/10/19 1900  ceFEPIme (MAXIPIME) 2 g in sodium chloride 0.9 % 100 mL IVPB  Status:  Discontinued        2 g 200 mL/hr over 30 Minutes Intravenous Every 8 hours 10/10/19 1859 10/12/19 1215   10/09/19 0000  ceFEPIme (MAXIPIME) 2 g in sodium chloride 0.9 % 100 mL IVPB  Status:  Discontinued        2 g 200 mL/hr over 30 Minutes Intravenous Every 8 hours 10/08/19 2346 10/10/19 1853         Objective   Vitals:   10/20/19 0753 10/20/19 0806 10/20/19 1132 10/20/19 1145  BP: 128/79 122/82 120/66  122/70  Pulse: (!) 121 98 98 99  Resp: 16 18 (!) 22 20  Temp: 98.8 F (37.1 C) 98.7 F (37.1 C) 98.8 F (37.1 C) 98.6 F (37 C)  TempSrc: Oral Oral Oral   SpO2: 98%  100% 98%  Weight:      Height:        Intake/Output Summary (Last 24 hours) at 10/20/2019 1304 Last data filed at 10/20/2019 0300 Gross per 24 hour  Intake 96.85 ml  Output 650 ml  Net -553.15 ml   Filed Weights   10/16/19 0537 10/18/19 0310 10/20/19 0434  Weight: 130.4 kg 128.6 kg 125.8 kg    Physical Exam:  General exam: awake, alert, no acute distress, obese Respiratory system: CTAB, normal respiratory effort. Cardiovascular system: normal S1/S2, RRR, no pedal edema.   Gastrointestinal system: soft, non-tender without guarding, no rebound tenderness, active bowel sounds. Central nervous system: A&O x3. no gross focal neurologic deficits, normal speech Extremities: moves all, no cyanosis, normal tone, scattered circular lesions appear stable Skin: gluteal cleft severely erythematous and excoriated, very tender with hygiene Psychiatry: normal mood, congruent affect, judgement and insight appear normal  Labs   Data Reviewed: I have personally reviewed following labs and imaging studies  CBC: Recent Labs  Lab 10/16/19 1843 10/16/19 1843 10/16/19 2301 10/17/19 0614 10/18/19 0636 10/19/19 0701 10/20/19 0527  WBC 8.0  --   --  14.9* 15.8* 11.5* 7.8  HGB 13.8   < > 8.4* 9.0* 8.6* 8.7* 8.0*  HCT 42.9   < > 25.9* 26.8* 26.6* 26.2* 25.3*  MCV 95.8  --   --  93.7 96.7 94.6 97.7  PLT 154  --   --  225 260 277 255   < > = values in this interval not displayed.   Basic Metabolic Panel: Recent Labs  Lab 10/16/19 0439 10/16/19 0439 10/16/19 1842 10/17/19 0614 10/18/19 0636 10/19/19 0701 10/20/19 0527  NA 134*   < > 131* 133* 133* 137 133*  K 3.3*   < > 3.5 3.4* 3.6 3.8 4.1  CL 92*   < >  88* 91* 93* 97* 96*  CO2 33*   < > 32 33* 30 30 30   GLUCOSE 205*   < > 238* 192* 145* 113* 158*  BUN 31*   < >  32* 30* 26* 25* 25*  CREATININE 1.45*   < > 1.46* 1.45* 1.38* 1.36* 1.62*  CALCIUM 7.8*   < > 7.9* 8.0* 7.8* 8.0* 8.2*  MG 2.0  --   --  2.0 2.0 2.2 2.4   < > = values in this interval not displayed.   GFR: Estimated Creatinine Clearance: 54.9 mL/min (A) (by C-G formula based on SCr of 1.62 mg/dL (H)). Liver Function Tests: Recent Labs  Lab 10/15/19 0608 10/16/19 0439 10/16/19 1842 10/17/19 0614 10/18/19 0628  AST 42* 41 41 34 35  ALT 15 17 16 15 16   ALKPHOS 84 88 97 85 90  BILITOT 1.2 1.1 1.2 1.4*  1.5* 1.4*  PROT 6.3* 6.4* 6.9 6.4* 6.2*  ALBUMIN 2.1* 2.2* 2.3* 2.2* 2.1*   Recent Labs  Lab 10/17/19 0614  LIPASE 38   No results for input(s): AMMONIA in the last 168 hours. Coagulation Profile: Recent Labs  Lab 10/16/19 1842 10/17/19 0737 10/18/19 0636 10/19/19 0701 10/20/19 0527  INR 1.6* 1.7* 1.8* 1.7* 1.7*   Cardiac Enzymes: No results for input(s): CKTOTAL, CKMB, CKMBINDEX, TROPONINI in the last 168 hours. BNP (last 3 results) No results for input(s): PROBNP in the last 8760 hours. HbA1C: No results for input(s): HGBA1C in the last 72 hours. CBG: Recent Labs  Lab 10/19/19 1207 10/19/19 1626 10/19/19 2028 10/20/19 0749 10/20/19 1134  GLUCAP 169* 274* 177* 145* 187*   Lipid Profile: No results for input(s): CHOL, HDL, LDLCALC, TRIG, CHOLHDL, LDLDIRECT in the last 72 hours. Thyroid Function Tests: No results for input(s): TSH, T4TOTAL, FREET4, T3FREE, THYROIDAB in the last 72 hours. Anemia Panel: No results for input(s): VITAMINB12, FOLATE, FERRITIN, TIBC, IRON, RETICCTPCT in the last 72 hours. Sepsis Labs: Recent Labs  Lab 10/16/19 1843  LATICACIDVEN 2.4*    Recent Results (from the past 240 hour(s))  Gastrointestinal Panel by PCR , Stool     Status: None   Collection Time: 10/11/19  4:07 PM   Specimen: Stool  Result Value Ref Range Status   Campylobacter species NOT DETECTED NOT DETECTED Final   Plesimonas shigelloides NOT DETECTED NOT  DETECTED Final   Salmonella species NOT DETECTED NOT DETECTED Final   Yersinia enterocolitica NOT DETECTED NOT DETECTED Final   Vibrio species NOT DETECTED NOT DETECTED Final   Vibrio cholerae NOT DETECTED NOT DETECTED Final   Enteroaggregative E coli (EAEC) NOT DETECTED NOT DETECTED Final   Enteropathogenic E coli (EPEC) NOT DETECTED NOT DETECTED Final   Enterotoxigenic E coli (ETEC) NOT DETECTED NOT DETECTED Final   Shiga like toxin producing E coli (STEC) NOT DETECTED NOT DETECTED Final   Shigella/Enteroinvasive E coli (EIEC) NOT DETECTED NOT DETECTED Final   Cryptosporidium NOT DETECTED NOT DETECTED Final   Cyclospora cayetanensis NOT DETECTED NOT DETECTED Final   Entamoeba histolytica NOT DETECTED NOT DETECTED Final   Giardia lamblia NOT DETECTED NOT DETECTED Final   Adenovirus F40/41 NOT DETECTED NOT DETECTED Final   Astrovirus NOT DETECTED NOT DETECTED Final   Norovirus GI/GII NOT DETECTED NOT DETECTED Final   Rotavirus A NOT DETECTED NOT DETECTED Final   Sapovirus (I, II, IV, and V) NOT DETECTED NOT DETECTED Final    Comment: Performed at [facility], [address] [place], [place] [address]  CULTURE, BLOOD (  ROUTINE X 2) w Reflex to ID Panel     Status: None (Preliminary result)   Collection Time: 10/16/19 12:21 PM   Specimen: BLOOD  Result Value Ref Range Status   Specimen Description BLOOD [facility]  Final   Special Requests   Final    BOTTLES DRAWN AEROBIC AND ANAEROBIC Blood Culture adequate volume   Culture   Final    NO GROWTH 4 DAYS Performed at [facility], [address] [place], [place] [address]    Report Status PENDING  Incomplete  CULTURE, BLOOD (ROUTINE X 2) w Reflex to ID Panel     Status: None (Preliminary result)   Collection Time: 10/16/19 12:32 PM   Specimen: BLOOD  Result Value Ref Range Status   Specimen Description BLOOD [facility]  Final   Special Requests   Final    BOTTLES DRAWN AEROBIC ONLY Blood Culture adequate volume   Culture    Final    NO GROWTH 4 DAYS Performed at [facility], [address], [place], [place] [address]    Report Status PENDING  Incomplete  Urine Culture     Status: None   Collection Time: 10/16/19  3:34 PM   Specimen: Urine, Random  Result Value Ref Range Status   Specimen Description   Final    URINE, RANDOM Performed at [facility], [address] [place], [place] [address]    Special Requests   Final    NONE Performed at [facility], [address] [place], [place] [address]    Culture   Final    NO GROWTH Performed at [facility], [place] [address] [place],  [address]    Report Status 10/17/2019 FINAL  Final  C Difficile Quick Screen (NO PCR Reflex)     Status: Abnormal   Collection Time: 10/17/19  8:15 PM   Specimen: STOOL  Result Value Ref Range Status   C Diff antigen POSITIVE (A) NEGATIVE Final   C Diff toxin POSITIVE (A) NEGATIVE Final   C Diff interpretation Toxin producing C. difficile detected.  Final    Comment: CRITICAL RESULT CALLED TO, READ BACK BY AND VERIFIED WITH: DENISE [facility] 10/17/19 AT 2127 BY ACR Performed at [facility], [address] [place],  [address]       Imaging Studies   No results found.   Medications   Scheduled Meds: . sodium chloride   Intravenous Once  . Chlorhexidine Gluconate Cloth  6 each Topical Daily  . feeding supplement (NEPRO CARB STEADY)  237 mL Oral BID BM  . Gerhardt's butt cream   Topical TID  . hydrocerin   Topical Daily  . insulin aspart  0-15 Units Subcutaneous TID WC  . insulin glargine  10 Units Subcutaneous QHS  . metoprolol tartrate  50 mg Oral BID  . multivitamin with minerals  1 tablet Oral Daily  . pantoprazole (PROTONIX) IV  40 mg Intravenous Q12H  . potassium chloride  40 mEq Oral BID  . pravastatin  20 mg Oral q1800  . sodium chloride flush  3 mL Intravenous Q12H  . vancomycin  125 mg Oral QID   Continuous Infusions: . sodium chloride      . lactated ringers with kcl         LOS: 12 days    Time spent: 30 minutes    Ezekiel Slocumb, DO [facility]  10/20/2019, 1:04 PM    If 7PM-7AM, please contact night-coverage. How  to contact the [facility] Attending or Consulting provider [place] or covering provider during after hours [place], for this patient?    1. Check the care team in [facility] and look for a) attending/consulting [facility] provider listed and b) the [facility] team listed 2. Log into [email or web address] and use [place]'s universal password to access. If you do not have the password, please contact the hospital operator. 3. Locate the [facility] provider you are looking for under [facility] and page to a number that you can be directly reached. 4. If you still have difficulty reaching the provider, please page the [facility] (Director on Call) for the Hospitalists listed on amion for assistance.

## 2019-10-20 NOTE — Progress Notes (Signed)
Patient Name: Christian Berg Date of Encounter: 10/20/2019  Hospital Problem List     Principal Problem:   (HFpEF) heart failure with preserved ejection fraction (HCC) Active Problems:   Diabetes mellitus ([place])   Benign essential HTN   Benign prostatic hyperplasia with urinary obstruction   Chronic atrial fibrillation (HCC)   Morbid (severe) obesity due to excess calories (HCC)   NASH (nonalcoholic steatohepatitis)   Bilateral carotid artery stenosis   CKD stage 3 due to type 2 diabetes mellitus (HCC)   Acute exacerbation of CHF (congestive heart failure) (HCC)   Leg wound, left, initial encounter   C. difficile colitis    Patient Profile        69 year old male with history of diabetes, chronic atrial fibrillation NASH, chronic kidney disease admitted with shortness of breath and heart failure.Abd/pelvic ct showed small left pleural effusion, mild ascites, cirrhosis with probable portal hypertension and possible colitis.Still with liquid diarrhea. Also has excoriation on buttocks and lower extremeties. He is positive of C. Diff. Had severe abdominal and lower chest pain last pm. Troponins were drawn and were normal. Telemetry showed afib with rvr. Creatinine elevated from yesterday. Not on diuresis at present.   Subjective   No further chest pain. Heart rate is improved from yesterday. Still afib with variable vr. Abdominal chest pain improved but still present.   Inpatient Medications    . sodium chloride   Intravenous Once  . Chlorhexidine Gluconate Cloth  6 each Topical Daily  . feeding supplement (NEPRO CARB STEADY)  237 mL Oral BID BM  . Gerhardt's butt cream   Topical TID  . hydrocerin   Topical Daily  . insulin aspart  0-15 Units Subcutaneous TID WC  . insulin glargine  10 Units Subcutaneous QHS  . metoprolol tartrate  50 mg Oral BID  . multivitamin with minerals  1 tablet Oral Daily  . pantoprazole (PROTONIX) IV  40 mg Intravenous Q12H  . potassium chloride   40 mEq Oral TID WC  . pravastatin  20 mg Oral q1800  . sodium chloride flush  3 mL Intravenous Q12H  . vancomycin  125 mg Oral QID    Vital Signs    Vitals:   10/19/19 2000 10/19/19 2200 10/20/19 0000 10/20/19 0434  BP: 120/70 105/74 (!) 112/58 (!) 110/56  Pulse: (!) 116 (!) 120 (!) 101 (!) 109  Resp:      Temp: 98.6 F (37 C) 98.4 F (36.9 C) 98.4 F (36.9 C) 98.8 F (37.1 C)  TempSrc: Oral Oral Oral Oral  SpO2:    94%  Weight:    125.8 kg  Height:        Intake/Output Summary (Last 24 hours) at 10/20/2019 0732 Last data filed at 10/20/2019 0300 Gross per 24 hour  Intake 326.85 ml  Output 650 ml  Net -323.15 ml   Filed Weights   10/16/19 0537 10/18/19 0310 10/20/19 0434  Weight: 130.4 kg 128.6 kg 125.8 kg    Physical Exam    GEN: Well nourished, well developed, in no acute distress.  HEENT: normal.  Neck: Supple, no JVD, carotid bruits, or masses. Cardiac: irr, irr Respiratory:  Respirations regular and unlabored, decreased breath sounds bilaterally. GI: still with mild epigastric and pelvic pain. MS: no deformity or atrophy. Skin: warm and dry, no rash. Neuro:  Strength and sensation are intact. Psych: Normal affect.  Labs    CBC Recent Labs    10/19/19 0701 10/20/19 0527  WBC 11.5* 7.8  HGB 8.7* 8.0*  HCT 26.2* 25.3*  MCV 94.6 97.7  PLT 277 932   Basic Metabolic Panel Recent Labs    10/19/19 0701 10/20/19 0527  NA 137 133*  K 3.8 4.1  CL 97* 96*  CO2 30 30  GLUCOSE 113* 158*  BUN 25* 25*  CREATININE 1.36* 1.62*  CALCIUM 8.0* 8.2*  MG 2.2 2.4   Liver Function Tests Recent Labs    10/18/19 0628  AST 35  ALT 16  ALKPHOS 90  BILITOT 1.4*  PROT 6.2*  ALBUMIN 2.1*   No results for input(s): LIPASE, AMYLASE in the last 72 hours. Cardiac Enzymes No results for input(s): CKTOTAL, CKMB, CKMBINDEX, TROPONINI in the last 72 hours. BNP No results for input(s): BNP in the last 72 hours. D-Dimer No results for input(s): DDIMER in the  last 72 hours. Hemoglobin A1C No results for input(s): HGBA1C in the last 72 hours. Fasting Lipid Panel No results for input(s): CHOL, HDL, LDLCALC, TRIG, CHOLHDL, LDLDIRECT in the last 72 hours. Thyroid Function Tests No results for input(s): TSH, T4TOTAL, T3FREE, THYROIDAB in the last 72 hours.  Invalid input(s): FREET3  Telemetry    afib with variable vr  ECG    afib with rvr  Radiology    DG Chest 1 View  Result Date: 10/12/2019 CLINICAL DATA:  Shortness of breath. EXAM: CHEST  1 VIEW COMPARISON:  October 08, 2019 FINDINGS: There is a dual lead AICD. Mild diffusely increased interstitial lung markings are seen the which is predominant stable in severity when compared to the prior exam. Mild to moderate severity prominence of the perihilar pulmonary vasculature is noted which represents a new finding. There is no evidence of a pleural effusion or pneumothorax. Stable moderate to marked severity enlargement of the cardiac silhouette is seen. A radiopaque fixation plate and multiple fixation screws are seen within the proximal left humerus. IMPRESSION: 1. Stable cardiomegaly with mild to moderate severity pulmonary vascular congestion. Electronically Signed   By: Virgina Norfolk M.D.   On: 10/12/2019 02:13   DG Chest 2 View  Result Date: 10/08/2019 CLINICAL DATA:  Shortness of breath, open wounds to the posterior left lower leg and anterior right lower leg, history of asthma EXAM: CHEST - 2 VIEW COMPARISON:  Radiograph 08/08/2019, CT 09/15/2015 FINDINGS: Diffuse hazy basilar predominant interstitial opacities with some fissural thickening as well. More patchy retrocardiac infrahilar opacities in the lung bases. No pneumothorax or visible effusion. Prominent cardiac silhouette compatible with cardiomegaly seen on comparison studies. Pacer pack overlies the left chest wall with leads at the cardiac apex and right atrium in similar positioning to prior. Remaining cardiomediastinal  contours are unremarkable. No acute osseous or soft tissue abnormality. Degenerative changes are present in the imaged spine and shoulders. Prior left humeral ORIF is noted. IMPRESSION: 1. Features of CHF/volume overload with cardiomegaly and interstitial opacities suggesting edema. 2. More patchy retrocardiac and infrahilar opacities in the lung bases may reflect developing alveolar edema, atelectasis or airspace disease. Electronically Signed   By: Lovena Le M.D.   On: 10/08/2019 17:36   DG Tibia/Fibula Left  Result Date: 10/08/2019 CLINICAL DATA:  Open wound to posterior left lower leg. EXAM: LEFT TIBIA AND FIBULA - 2 VIEW COMPARISON:  None. FINDINGS: There is no evidence of fracture or other focal bone lesions. Mild diffuse soft tissue swelling is seen throughout the left calf with moderate severity soft tissue swelling also noted along the anterior aspect of the left tibia. No soft tissue air  is seen. IMPRESSION: 1. Soft tissue swelling which may be secondary to diffuse cellulitis, without evidence of an acute osseous abnormality. Electronically Signed   By: Virgina Norfolk M.D.   On: 10/08/2019 19:49   CT ABDOMEN PELVIS W CONTRAST  Result Date: 10/17/2019 CLINICAL DATA:  Upper abdominal pain. EXAM: CT ABDOMEN AND PELVIS WITH CONTRAST TECHNIQUE: Multidetector CT imaging of the abdomen and pelvis was performed using the standard protocol following bolus administration of intravenous contrast. CONTRAST:  <See Chart> OMNIPAQUE IOHEXOL 300 MG/ML SOLN, 80m OMNIPAQUE IOHEXOL 350 MG/ML SOLN COMPARISON:  August 04, 2019 FINDINGS: Lower chest: There is a small left-sided pleural effusion. There is bibasilar atelectasis.The heart is enlarged. Hepatobiliary: The liver is cirrhotic. Normal gallbladder.There is no biliary ductal dilation. Pancreas: Normal contours without ductal dilatation. No peripancreatic fluid collection. Spleen: Unremarkable. Adrenals/Urinary Tract: --Adrenal glands: Unremarkable. --Right  kidney/ureter: No hydronephrosis or radiopaque kidney stones. --Left kidney/ureter: No hydronephrosis or radiopaque kidney stones. --Urinary bladder: The bladder is decompressed by Foley catheter and therefore is poorly evaluated. Stomach/Bowel: --Stomach/Duodenum: There is a surgical clip within the proximal duodenum. --Small bowel: Unremarkable. --Colon: There is mild diffuse circumferential wall thickening of the sigmoid and descending colon. There is a metallic foreign body at the level of the proximal descending colon which may be related to recent intervention. --Appendix: Surgically absent. Vascular/Lymphatic: Atherosclerotic calcification is present within the non-aneurysmal abdominal aorta, without hemodynamically significant stenosis. There is evidence for portal hypertension with recanalization of the umbilical vein in addition to extensive portosystemic shunting. The portal vein appears patent. The SMV is patent. The splenic vein remains patent. There may be some small esophageal varices. --No retroperitoneal lymphadenopathy. --No mesenteric lymphadenopathy. --No pelvic or inguinal lymphadenopathy. Reproductive: Unremarkable Other: There is a small volume of abdominal ascites. The abdominal wall is normal. Musculoskeletal. No acute displaced fractures. IMPRESSION: 1. Mild diffuse circumferential wall thickening of the sigmoid and descending colon may be secondary to underdistention or mild colitis. 2. Cirrhosis with stigmata of portal hypertension including recanalization of the umbilical vein and extensive portosystemic shunting. 3. Small volume abdominal ascites. 4. Small left-sided pleural effusion with bibasilar atelectasis. 5. Cardiomegaly. Aortic Atherosclerosis (ICD10-I70.0). Electronically Signed   By: CConstance HolsterM.D.   On: 10/17/2019 16:41   DG Chest Port 1 View  Result Date: 10/16/2019 CLINICAL DATA:  Hypertension, CHF EXAM: PORTABLE CHEST 1 VIEW COMPARISON:  10/12/2019 FINDINGS:  Mild bilateral interstitial thickening. No pleural effusion or pneumothorax. No focal consolidation. Stable cardiomegaly. Dual lead cardiac pacemaker. No acute osseous abnormality. IMPRESSION: Cardiomegaly with mild pulmonary vascular congestion. Electronically Signed   By: HKathreen Devoid  On: 10/16/2019 15:37    Assessment & Plan    Principal Problem: (HFpEF) heart failure with preserved ejection fraction (HCC) Active Problems: [place] Benign essential HTN Benign prostatic hyperplasia with urinary obstruction Chronic atrial fibrillation (HCC) Morbid (severe) obesity due to excess calories (HCC) NASH (nonalcoholic steatohepatitis) Bilateral carotid artery stenosis CKD stage 3 due to type 2 diabetes mellitus [place] Acute exacerbation of CHF (congestive heart failure) (HCC) Leg wound, left, initial encounter   1.Acute on chronic diastolic CHF,EF 670-96%;[id number] weight gain, progressive exertional dyspnea, peripheral edema, and orthopnea, with anasarc.  Not on any diuretics at present due to volume depletion and loose stools.   Has diuresed 6.25L since admission. Creatinineincreased up to 1.62 this am. K 4.1, Na133.  Will remain off of diuresis for now.  2.Chronic atrial fibrillation, on metoprolol tartrate50 mg twice daily for rate control, warfarin on  hold due tobleeding.Elevated ventricular rate to 110s-120s bpm ,RVR likely compensatorysecondary to GI bleed and anemia,patient asymptomatic. 3.Anemia,hemoglobin 8.0 which is a decrease from previous day., underwent colonoscopy earlier during his hospitalization which revealed bleeding visible vessel second portion of duodenum underwent hemostatic clips. Will need to remain off of anticoagulation. INR is 1.7 at present.   5. NASH,  Recommendations  1.Will hold lasix for now. [name] KitchenMarland KitchenWillcontinue withmetoprolol 50 mg bid and follow heart rate and blood pressure  2.Continue  diuresisfollowing hemodynamics and electrolytes.  3.Carefully monitor renal status and continue to hold diuresis.  4.Monitor daily weights andI&O's 5.Defer further cardiac diagnostics at this time 6.Follow-up with Dr. Nehemiah Massed after discharge  Signed, Javier Docker. [place] Dube MD 10/20/2019, 7:32 AM  Pager: [phone]

## 2019-10-21 LAB — BASIC METABOLIC PANEL
Anion gap: 8 (ref 5–15)
BUN: 19 mg/dL (ref 8–23)
CO2: 27 mmol/L (ref 22–32)
Calcium: 8.1 mg/dL — ABNORMAL LOW (ref 8.9–10.3)
Chloride: 100 mmol/L (ref 98–111)
Creatinine, Ser: 1.37 mg/dL — ABNORMAL HIGH (ref 0.61–1.24)
GFR calc Af Amer: >60 mL/min (ref >60)
GFR calc non Af Amer: 52 mL/min — ABNORMAL LOW (ref >60)
Glucose, Bld: 148 mg/dL — ABNORMAL HIGH (ref 70–99)
Potassium: 4.6 mmol/L (ref 3.5–5.1)
Sodium: 135 mmol/L (ref 135–145)

## 2019-10-21 LAB — CBC
HCT: 26.3 % — ABNORMAL LOW (ref 39.0–52.0)
Hemoglobin: 8.2 g/dL — ABNORMAL LOW (ref 13.0–17.0)
MCH: 30.8 pg (ref 26.0–34.0)
MCHC: 31.2 g/dL (ref 30.0–36.0)
MCV: 98.9 fL (ref 80.0–100.0)
Platelets: 262 10*3/uL (ref 150–400)
RBC: 2.66 MIL/uL — ABNORMAL LOW (ref 4.22–5.81)
RDW: 20.8 % — ABNORMAL HIGH (ref 11.5–15.5)
WBC: 7.3 10*3/uL (ref 4.0–10.5)
nRBC: 0 % (ref 0.0–0.2)

## 2019-10-21 LAB — GLUCOSE, CAPILLARY
Glucose-Capillary: 128 mg/dL — ABNORMAL HIGH (ref 70–99)
Glucose-Capillary: 180 mg/dL — ABNORMAL HIGH (ref 70–99)
Glucose-Capillary: 226 mg/dL — ABNORMAL HIGH (ref 70–99)
Glucose-Capillary: 235 mg/dL — ABNORMAL HIGH (ref 70–99)

## 2019-10-21 LAB — PROTIME-INR
INR: 1.7 — ABNORMAL HIGH (ref 0.8–1.2)
Prothrombin Time: 18.9 seconds — ABNORMAL HIGH (ref 11.4–15.2)

## 2019-10-21 LAB — CULTURE, BLOOD (ROUTINE X 2)
Culture: NO GROWTH
Culture: NO GROWTH
Special Requests: ADEQUATE
Special Requests: ADEQUATE

## 2019-10-21 LAB — MAGNESIUM: Magnesium: 2.2 mg/dL (ref 1.7–2.4)

## 2019-10-21 NOTE — Progress Notes (Signed)
PT Cancellation Note  Patient Details Name: Christian Berg MRN: [id number] DOB: 1950-12-02   Cancelled Treatment:    Reason Eval/Treat Not Completed: Patient declined.  Pt reporting he was about to drift off to sleep and wanted to rest instead (pt declined physical therapy session).  Will re-attempt PT treatment session at a later date/time as able.    Leitha Bleak, PT 10/21/19, 2:10 PM

## 2019-10-21 NOTE — Progress Notes (Signed)
Occupational Therapy Treatment Patient Details Name: Christian Berg MRN: [id number] DOB: [birth year] Today's Date: 10/21/2019    History of present illness Pt is a 69 y.o. male presenting to hospital 9/11 with worsening orthopnea, exertional SOB, worsening LE edema, and developing wounds B LE's.  Pt admitted with acute exacerbation of diastolic CHF and leg wounds; also c/o L LE pain.  PMH inlcudes a-fib, CHF, DM, carotid stenosis, htn, pacemaker, PVD, and TIA.   OT comments  Pt seen for OT treatment this date to f/u re: safety with ADLs/ADL mobility. Pt in good spirits and willing to participate this session. OT assists pt with coming to EOB sitting with SUPV with extended time and use of bed rail. Pt demos G static sitting balance. Pt CTS from elevated bed surface (~3-4 inches) with RW with MIN A. Pt demos F standing balance with RW support and CGA for static stand. Pt requires MOD A to perform thorough standing peri care after small episode of loose BM incontinence, MIN A to take 2-3 steps to sink-side and perform hand hygiene and take 2-3 backward steps back to bed. Pt left sitting EOB with nurse presenting to assess and give meds. Pt continues to require increased assist versus baseline. Continue to recommend SNF. Pt wanting to go home. If he is to d/c home, will require HHOT and SUPV for all fxl mobility/transfers at least initially on d/c.    Follow Up Recommendations  SNF    Equipment Recommendations  3 in 1 bedside commode;Tub/shower seat    Recommendations for Other Services      Precautions / Restrictions Precautions Precautions: Fall Restrictions Weight Bearing Restrictions: No       Mobility Bed Mobility Overal bed mobility: Needs Assistance Bed Mobility: Supine to Sit     Supine to sit: Supervision;HOB elevated     General bed mobility comments: extended time  Transfers Overall transfer level: Needs assistance Equipment used: Rolling walker (2  wheeled) Transfers: Sit to/from Stand Sit to Stand: Min guard;Min assist;From elevated surface         General transfer comment: MIN verbal cues for hand placement relative to RW. bed elevated ~3-4 inches    Balance Overall balance assessment: Needs assistance Sitting-balance support: No upper extremity supported;Feet supported Sitting balance-Leahy Scale: Good     Standing balance support: Bilateral upper extremity supported Standing balance-Leahy Scale: Fair                             ADL either performed or assessed with clinical judgement   ADL Overall ADL's : Needs assistance/impaired     Grooming: Minimal assistance;Standing Grooming Details (indicate cue type and reason): MIN A for standing balance with RW sink-side to wash hands.                     Toileting- Clothing Manipulation and Hygiene: Moderate assistance;Sit to/from stand Toileting - Clothing Manipulation Details (indicate cue type and reason): MOD A standing peri care after 3 small loose incontinent BMs.     Functional mobility during ADLs: Minimal assistance;Rolling walker (to take 2-3 steps FWD/BKWD to/from sink/EOB.)       Vision Patient Visual Report: No change from baseline     Perception     Praxis      Cognition Arousal/Alertness: Awake/alert Behavior During Therapy: WFL for tasks assessed/performed Overall Cognitive Status: Within Functional Limits for tasks assessed  General Comments: some lack of insight into deficits/unrealistic expectations for discharge, but overall appropriate. Pt is oriented. Follows commands well.        Exercises Other Exercises Other Exercises: OT educates re: importance of OOB activity and trying to do as much as possible I'ly if he is wanting to go home versus rehab. Pt with good understanding and agreeable.   Shoulder Instructions       General Comments VS monitored throughout sesion.  Pt on RA with spO2 >96 throughout, HR varies from 99-115 with activity, SBP sitting 130s, after activity 150s, MAP 100s    Pertinent Vitals/ Pain       Pain Assessment: Faces Faces Pain Scale: Hurts little more Pain Location: raw skin around posterior perineum Pain Descriptors / Indicators: Tender Pain Intervention(s): Limited activity within patient's tolerance;Monitored during session  Home Living                                          Prior Functioning/Environment              Frequency  Min 1X/week        Progress Toward Goals  OT Goals(current goals can now be found in the care plan section)  Progress towards OT goals: Progressing toward goals  Acute Rehab OT Goals Patient Stated Goal: to improve strength and mobility OT Goal Formulation: With patient Time For Goal Achievement: 10/29/19 Potential to Achieve Goals: Good  Plan Discharge plan remains appropriate;Frequency remains appropriate    Co-evaluation                 AM-PAC OT "6 Clicks" Daily Activity     Outcome Measure   Help from another person eating meals?: A Little Help from another person taking care of personal grooming?: A Little Help from another person toileting, which includes using toliet, bedpan, or urinal?: A Lot Help from another person bathing (including washing, rinsing, drying)?: A Lot Help from another person to put on and taking off regular upper body clothing?: A Little Help from another person to put on and taking off regular lower body clothing?: A Lot 6 Click Score: 15    End of Session Equipment Utilized During Treatment: Gait belt;Rolling walker  OT Visit Diagnosis: Muscle weakness (generalized) (M62.81);Other abnormalities of gait and mobility (R26.89)   Activity Tolerance Patient tolerated treatment well   Patient Left in bed;Other (comment) (pt sitting EOB with OT when RN presents to give meds and apply new spO2 monitor. Pt left sitting EOB in  nurse's care)   Nurse Communication Mobility status        Time: 7048-8891 OT Time Calculation (min): 43 min  Charges: OT General Charges $OT Visit: 1 Visit OT Treatments $Self Care/Home Management : 23-37 mins $Therapeutic Activity: 8-22 mins  Gerrianne Scale, MS, OTR/L ascom [phone] 10/21/19, 2:29 PM

## 2019-10-21 NOTE — Progress Notes (Signed)
PROGRESS NOTE    Christian Berg   FBP:[id number]  DOB: [birth year]  PCP: Birdie Sons, MD    DOA: 10/08/2019 LOS: 67   Brief Narrative   HPI per Dr. Gala Romney on 10/09/19 "Christian Berg is a 69 y.o. male with medical history significant of diabetes, hypertension, diastolic dysfunction CHF, disseminated superficial actinic porokeratosis, degenerative disc disease, recent bleeding episodes, atrial fibrillation and peripheral vascular disease who presented to the ER with left lower extremity ulcer.  He already has a right lower extremity ulcer.  He came in for wound check.  Patient has noted increasing weight about 25 pounds more in the last few weeks.  He is taking diuretics at home.  He also has noted increasing shortness of breath.  He has bruising easily and was told to stop his Coumadin about a week ago.  Patient was seen in the ER in addition to his leg wounds was found to be having episode of anasarca.  Appears he has acute exacerbation of his diastolic CHF so he is being admitted to the hospital for further evaluation and treatment..   ED Course: Temperature is 99.1 blood pressure 130/47 pulse 140 respirate 26 oxygen sats 94% on room air.  White count 9.0 hemoglobin 9.3 and platelet 235.  Chemistry appears to be within normal except for creatinine 1.38 and calcium 8.4.  Glucose 145.  COVID-19 screen is negative.  Chest x-ray showed evidence of mild fluid overload.  X-ray of the tibia-fibula shows no evidence of osseous abnormality.  Patient being admitted with acute exacerbation of CHF and also possible infected wound."     Lower extremity cellulitis treated with Cefepime. Developed diarrhea initially attributed to antibiotic.  Diuresed with IV Lasix, including infusion.  Cardiology consulted.  Lasix eventually stopped due to soft Bp's.    GI Bleeding with positive FOBT.  GI consulted.  EGD on 9/16 showed actively bleeding duodenal ulcer which was clipped.  Received 1 unit  pRBC transfusion. Hemoglobin has since remained stable.   9/19-20 - increasing frequency of watery stools.  Patient pan-cultured and checked for C diff which returned positive.  Started on oral vancomycin.           Assessment & Plan   Principal Problem:   (HFpEF) heart failure with preserved ejection fraction (HCC) Active Problems:   Diabetes mellitus (HCC)   Benign essential HTN   Benign prostatic hyperplasia with urinary obstruction   Chronic atrial fibrillation (HCC)   Morbid (severe) obesity due to excess calories (HCC)   NASH (nonalcoholic steatohepatitis)   Bilateral carotid artery stenosis   CKD stage 3 due to type 2 diabetes mellitus (HCC)   Acute exacerbation of CHF (congestive heart failure) (HCC)   Leg wound, left, initial encounter   C. difficile colitis   C. Diff colitis - on PO Vancomycin (9/20>> ).  Continues to have frequent large volume diarrhea. 9/20 reported upper abdominal pain, mild tenderness to palpation, and increased watery stool output, rising white count.  CT abdomen/pelvis showed mild colitis.  C diff returned positive.  Hemodynamically stable. - continue oral vancomycin  AKI superimposed on CKD stage IIIa - AKI not present on admission, secondary to diarrhea/hypovolemia with C diff. Cr 1.36 >> 1.62 today.  Will run gentle IV fluids and monitor closely (given he presented volume overloaded). Monitor BMP.  Hold lisinopril, spironolactone, Lasix  Acute on Chronic Diastolic Congestive Heart Failure - presented with severe volume overload with anasarca. BNP on admission was 399.1.  Prior  echo from 08/05/2019, EF normal.  --Cardiology consulted --Was on Lasix infusion.  Now off Lasix due to soft BP's and diarrhea.   --Monitor volume status   Acute blood loss anemia secondary to Duodenal ulcer - EGD 9/16, actively bleeding lesion clipped.  Hbg stable since clipping and transfusions of RBC's on 9/16 and 9/19.   1 U transfused 9/16, pt tolerated well.  Another unit transfused 9/19 --Completed  5 days ceftriaxone for sbp ppx --monitor Hbg, transfuse as needed if Hbg <7 --page GI if recurrent bleeding   Generalized weakness - due to prolonged and complicated hospital admission, multiple acute illnesses.  PT and OT recommending SNF, but patient refuses.  TOC following to arrange home health services.     Left leg wound with cellulitis in the setting of DSAP - Resolved, treated   Chronic Atrial fibrillation with rapid ventricular response - now rate controlled.   --Patient taken off of warfarin recently due to concern of skin bleeding --holding on anticoagulation given GI bleed this admission   Hypokalemia - recurrent due to diarrhea.  Replacing with IV fluid additive.  Will give more PO as needed. Maintain K>4.0, Mg>2.0.  Type 2 Diabetes Mellitus - Hold Metformin.  Sliding scale Novolog.  Lantus 10 units qHS.  NASH liver cirrhosis - LFT's stable, mild Tbili elevation.  No acute issues.  Monitor.  EssentialHypertension - chronic, stable.  Continue Lopressor BID.  Monitor BP and maintain MAP>65. Hold lisinopril, spironolactone, Lasix   Morbid Obesity - Body mass index is 43.74 kg/m.   Complicates overall care and prognosis.  Counseled regarding lifestyle modifications including diet and physical activity.    DVT prophylaxis: Place TED hose Start: 10/13/19 1600 SCDs Start: 10/09/19 0223   Diet:  Diet Orders (From admission, onward)    Start     Ordered   10/18/19 1501  Diet 2 gram sodium Room service appropriate? Yes; Fluid consistency: Thin; Fluid restriction: 1200 mL Fluid  Diet effective now       Question Answer Comment  Room service appropriate? Yes   Fluid consistency: Thin   Fluid restriction: 1200 mL Fluid      10/18/19 1501            Code Status: Full Code    Subjective 10/21/19    Patient seen this AM at bedside.  Reports feeling better but says he's tired.  Diarrhea has slowed down, but still  quite watery.  No fever/chills, abdominal pain, N/V or other acute complaints.  Confirms he will not consider SNF for rehab.  Is having a new lift/sleep chair delivered to his home today, and wants to go home with home health.     Disposition Plan & Communication   Status is: Inpatient  Remains inpatient appropriate because:Inpatient level of care appropriate due to severity of illness  With ongoing frequent watery diarrhea due to C diff.  Requires SNF vs full scope of [place] services.  Dispo: The patient is from: Home              Anticipated d/c is to: Home              Anticipated d/c date is: 2 days              Patient currently is not medically stable to d/c.    Family Communication: spoke to wife by phone today 9/24.   Consults, Procedures, Significant Events   Consultants:   Cardiology  Gastroenterology  Procedures:   EGD 10/13/19  Antimicrobials:  Anti-infectives (From admission, onward)   Start     Dose/Rate Route Frequency Ordered Stop   10/17/19 1800  vancomycin (VANCOCIN) 50 mg/mL oral solution 125 mg        125 mg Oral 4 times daily 10/17/19 1708 10/27/19 1759   10/16/19 1400  cefTRIAXone (ROCEPHIN) 1 g in sodium chloride 0.9 % 100 mL IVPB        1 g 200 mL/hr over 30 Minutes Intravenous Every 24 hours 10/15/19 1508 10/17/19 0850   10/12/19 1400  cefTRIAXone (ROCEPHIN) 2 g in sodium chloride 0.9 % 100 mL IVPB  Status:  Discontinued        2 g 200 mL/hr over 30 Minutes Intravenous Every 24 hours 10/12/19 1327 10/15/19 1508   10/11/19 2100  ceFEPIme (MAXIPIME) 2 g in sodium chloride 0.9 % 100 mL IVPB  Status:  Discontinued        2 g 200 mL/hr over 30 Minutes Intravenous Every 8 hours 10/10/19 1853 10/10/19 1859   10/10/19 1900  ceFEPIme (MAXIPIME) 2 g in sodium chloride 0.9 % 100 mL IVPB  Status:  Discontinued        2 g 200 mL/hr over 30 Minutes Intravenous Every 8 hours 10/10/19 1859 10/12/19 1215   10/09/19 0000  ceFEPIme (MAXIPIME) 2 g in sodium chloride  0.9 % 100 mL IVPB  Status:  Discontinued        2 g 200 mL/hr over 30 Minutes Intravenous Every 8 hours 10/08/19 2346 10/10/19 1853         Objective   Vitals:   10/20/19 1500 10/20/19 2033 10/21/19 0532 10/21/19 0736  BP: (!) 124/52 (!) 117/46 105/66 120/66  Pulse: 100 (!) 106 (!) 110   Resp: 18 20  19   Temp: 98.6 F (37 C) 99.9 F (37.7 C) 99.3 F (37.4 C) 98.5 F (36.9 C)  TempSrc:  Oral Oral Oral  SpO2: 98% 99% 99% 99%  Weight:   130.5 kg   Height:        Intake/Output Summary (Last 24 hours) at 10/21/2019 0816 Last data filed at 10/20/2019 1744 Gross per 24 hour  Intake 527.84 ml  Output 800 ml  Net -272.16 ml   Filed Weights   10/18/19 0310 10/20/19 0434 10/21/19 0532  Weight: 128.6 kg 125.8 kg 130.5 kg    Physical Exam:  General exam: awake, alert, no acute distress, obese Respiratory system: CTAB, normal respiratory effort. Cardiovascular system: normal S1/S2, RRR, no pedal edema.   Gastrointestinal system: soft, non-tender, no guarding or rebound tenderness, active bowel sounds. Extremities: no LE edema, scattered circular lesions appear stable Psychiatry: normal mood, congruent affect, judgement and insight appear normal  Labs   Data Reviewed: I have personally reviewed following labs and imaging studies  CBC: Recent Labs  Lab 10/17/19 0614 10/18/19 0636 10/19/19 0701 10/20/19 0527 10/21/19 0600  WBC 14.9* 15.8* 11.5* 7.8 7.3  HGB 9.0* 8.6* 8.7* 8.0* 8.2*  HCT 26.8* 26.6* 26.2* 25.3* 26.3*  MCV 93.7 96.7 94.6 97.7 98.9  PLT 225 260 277 255 366   Basic Metabolic Panel: Recent Labs  Lab 10/17/19 0614 10/18/19 0636 10/19/19 0701 10/20/19 0527 10/21/19 0600  NA 133* 133* 137 133* 135  K 3.4* 3.6 3.8 4.1 4.6  CL 91* 93* 97* 96* 100  CO2 33* 30 30 30 27   GLUCOSE 192* 145* 113* 158* 148*  BUN 30* 26* 25* 25* 19  CREATININE 1.45* 1.38* 1.36* 1.62* 1.37*  CALCIUM 8.0* 7.8* 8.0*  8.2* 8.1*  MG 2.0 2.0 2.2 2.4 2.2   GFR: Estimated  Creatinine Clearance: 66.1 mL/min (A) (by C-G formula based on SCr of 1.37 mg/dL (H)). Liver Function Tests: Recent Labs  Lab 10/15/19 0608 10/16/19 0439 10/16/19 1842 10/17/19 0614 10/18/19 0628  AST 42* 41 41 34 35  ALT 15 17 16 15 16   ALKPHOS 84 88 97 85 90  BILITOT 1.2 1.1 1.2 1.4*  1.5* 1.4*  PROT 6.3* 6.4* 6.9 6.4* 6.2*  ALBUMIN 2.1* 2.2* 2.3* 2.2* 2.1*   Recent Labs  Lab 10/17/19 0614  LIPASE 38   No results for input(s): AMMONIA in the last 168 hours. Coagulation Profile: Recent Labs  Lab 10/17/19 0614 10/18/19 0636 10/19/19 0701 10/20/19 0527 10/21/19 0600  INR 1.7* 1.8* 1.7* 1.7* 1.7*   Cardiac Enzymes: No results for input(s): CKTOTAL, CKMB, CKMBINDEX, TROPONINI in the last 168 hours. BNP (last 3 results) No results for input(s): PROBNP in the last 8760 hours. HbA1C: No results for input(s): HGBA1C in the last 72 hours. CBG: Recent Labs  Lab 10/20/19 0749 10/20/19 1134 10/20/19 1807 10/20/19 2036 10/21/19 0757  GLUCAP 145* 187* 201* 201* 128*   Lipid Profile: No results for input(s): CHOL, HDL, LDLCALC, TRIG, CHOLHDL, LDLDIRECT in the last 72 hours. Thyroid Function Tests: No results for input(s): TSH, T4TOTAL, FREET4, T3FREE, THYROIDAB in the last 72 hours. Anemia Panel: No results for input(s): VITAMINB12, FOLATE, FERRITIN, TIBC, IRON, RETICCTPCT in the last 72 hours. Sepsis Labs: Recent Labs  Lab 10/16/19 1843  LATICACIDVEN 2.4*    Recent Results (from the past 240 hour(s))  Gastrointestinal Panel by PCR , Stool     Status: None   Collection Time: 10/11/19  4:07 PM   Specimen: Stool  Result Value Ref Range Status   Campylobacter species NOT DETECTED NOT DETECTED Final   Plesimonas shigelloides NOT DETECTED NOT DETECTED Final   Salmonella species NOT DETECTED NOT DETECTED Final   Yersinia enterocolitica NOT DETECTED NOT DETECTED Final   Vibrio species NOT DETECTED NOT DETECTED Final   Vibrio cholerae NOT DETECTED NOT DETECTED Final     Enteroaggregative E coli (EAEC) NOT DETECTED NOT DETECTED Final   Enteropathogenic E coli (EPEC) NOT DETECTED NOT DETECTED Final   Enterotoxigenic E coli (ETEC) NOT DETECTED NOT DETECTED Final   Shiga like toxin producing E coli (STEC) NOT DETECTED NOT DETECTED Final   Shigella/Enteroinvasive E coli (EIEC) NOT DETECTED NOT DETECTED Final   Cryptosporidium NOT DETECTED NOT DETECTED Final   Cyclospora cayetanensis NOT DETECTED NOT DETECTED Final   Entamoeba histolytica NOT DETECTED NOT DETECTED Final   Giardia lamblia NOT DETECTED NOT DETECTED Final   Adenovirus F40/41 NOT DETECTED NOT DETECTED Final   Astrovirus NOT DETECTED NOT DETECTED Final   Norovirus GI/GII NOT DETECTED NOT DETECTED Final   Rotavirus A NOT DETECTED NOT DETECTED Final   Sapovirus (I, II, IV, and V) NOT DETECTED NOT DETECTED Final    Comment: Performed at [facility], [address] [place], [place] [address]  CULTURE, BLOOD (ROUTINE X 2) w Reflex to ID Panel     Status: None (Preliminary result)   Collection Time: 10/16/19 12:21 PM   Specimen: BLOOD  Result Value Ref Range Status   Specimen Description BLOOD [facility]  Final   Special Requests   Final    BOTTLES DRAWN AEROBIC AND ANAEROBIC Blood Culture adequate volume   Culture   Final    NO GROWTH 4 DAYS Performed at [facility], [address] [place], [place] [address]    Report Status PENDING  Incomplete  CULTURE, BLOOD (ROUTINE X 2) w Reflex to ID Panel     Status: None (Preliminary result)   Collection Time: 10/16/19 12:32 PM   Specimen: BLOOD  Result Value Ref Range Status   Specimen Description BLOOD [facility]  Final   Special Requests   Final    BOTTLES DRAWN AEROBIC ONLY Blood Culture adequate volume   Culture   Final    NO GROWTH 4 DAYS Performed at [facility], [address], [place], [place] [address]    Report Status PENDING  Incomplete  Urine Culture     Status: None   Collection Time: 10/16/19  3:34 PM    Specimen: Urine, Random  Result Value Ref Range Status   Specimen Description   Final    URINE, RANDOM Performed at [facility], [address], [place], [place] [address]    Special Requests   Final    NONE Performed at [facility], [address], [place], [place] [address]    Culture   Final    NO GROWTH Performed at [facility], [place] [address] [place], [place] [address]    Report Status 10/17/2019 FINAL  Final  C Difficile Quick Screen (NO PCR Reflex)     Status: Abnormal   Collection Time: 10/17/19  8:15 PM   Specimen: STOOL  Result Value Ref Range Status   C Diff antigen POSITIVE (A) NEGATIVE Final   C Diff toxin POSITIVE (A) NEGATIVE Final   C Diff interpretation Toxin producing C. difficile detected.  Final    Comment: CRITICAL RESULT CALLED TO, READ BACK BY AND VERIFIED WITH: DENISE [facility] 10/17/19 AT 2127 BY ACR Performed at [facility], [address], [place], [place] [address]       Imaging Studies   No results found.   Medications   Scheduled Meds: . sodium chloride   Intravenous Once  . Chlorhexidine Gluconate Cloth  6 each Topical Daily  . feeding supplement (NEPRO CARB STEADY)  237 mL Oral BID BM  . Gerhardt's butt cream   Topical TID  . hydrocerin   Topical Daily  . insulin aspart  0-15 Units Subcutaneous TID WC  . insulin glargine  10 Units Subcutaneous QHS  . metoprolol tartrate  50 mg Oral BID  . multivitamin with minerals  1 tablet Oral Daily  . pantoprazole (PROTONIX) IV  40 mg Intravenous Q12H  . pravastatin  20 mg Oral q1800  . sodium chloride flush  3 mL Intravenous Q12H  . vancomycin  125 mg Oral QID   Continuous Infusions: . sodium chloride    . lactated ringers with kcl 75 mL/hr at 10/21/19 0640       LOS: 13 days    Time spent: 32 minutes with > 50% spent in coordination of care and direct patient contact.    Ezekiel Slocumb, DO [facility]  10/21/2019, 8:16 AM    If  7PM-7AM, please contact night-coverage. How to contact the [facility] Attending or Consulting provider [place] or covering provider during after hours [place], for this patient?    1. Check the care team in [facility] and look for a) attending/consulting [facility] provider listed and b) the [facility] team listed 2. Log into [email or web address] and use [place]'s universal password to access. If you do not have the password, please contact the hospital operator. 3. Locate the [facility]  provider you are looking for under [facility] and page to a number that you can be directly reached. 4. If you still have difficulty reaching the provider, please page the [facility] (Director on Call) for the Hospitalists listed on amion for assistance.

## 2019-10-21 NOTE — Progress Notes (Signed)
Patient c/o left dull chest/upper shoulder pain. Rates pain 6/10. Dr. Ubaldo Glassing on the floor, came in to assess patient. VSS, bp 120/66, 99% RA. Made dr. Ubaldo Glassing aware no prn nitro, md stated pressures prior would no tolerate, does not want nitro at this time. Prn pain medication Vicodin given. EKG was ordered. Will continue to monitor closely

## 2019-10-21 NOTE — Progress Notes (Signed)
Patient Name: Christian Berg Date of Encounter: 10/21/2019  Hospital Problem List     Principal Problem:   (HFpEF) heart failure with preserved ejection fraction (HCC) Active Problems:   Diabetes mellitus ([place])   Benign essential HTN   Benign prostatic hyperplasia with urinary obstruction   Chronic atrial fibrillation (HCC)   Morbid (severe) obesity due to excess calories (HCC)   NASH (nonalcoholic steatohepatitis)   Bilateral carotid artery stenosis   CKD stage 3 due to type 2 diabetes mellitus (HCC)   Acute exacerbation of CHF (congestive heart failure) (HCC)   Leg wound, left, initial encounter   C. difficile colitis    Patient Profile     69 year old male with history of diabetes, chronic atrial fibrillation NASH, chronic kidney disease admitted with shortness of breath and heart failure.Abd/pelvic ct showed small left pleural effusion, mild ascites, cirrhosis with probable portal hypertension and possible colitis.Still with liquid diarrhea. Also has excoriation on buttocks and lower extremeties. Still with diarrhea. Being treated for c dif.  Subjective   This am developed left upper chest pain, similr to several days ago. Workup then included negative hs troponin and no change in his ekg. Pain is somehat different than his abdominal pain. He states he was using his upper body to pull himselt up in bed earlier this am.  Inpatient Medications    . sodium chloride   Intravenous Once  . Chlorhexidine Gluconate Cloth  6 each Topical Daily  . feeding supplement (NEPRO CARB STEADY)  237 mL Oral BID BM  . Gerhardt's butt cream   Topical TID  . hydrocerin   Topical Daily  . insulin aspart  0-15 Units Subcutaneous TID WC  . insulin glargine  10 Units Subcutaneous QHS  . metoprolol tartrate  50 mg Oral BID  . multivitamin with minerals  1 tablet Oral Daily  . pantoprazole (PROTONIX) IV  40 mg Intravenous Q12H  . potassium chloride  40 mEq Oral BID  . pravastatin  20 mg Oral  q1800  . sodium chloride flush  3 mL Intravenous Q12H  . vancomycin  125 mg Oral QID    Vital Signs    Vitals:   10/20/19 1433 10/20/19 1500 10/20/19 2033 10/21/19 0532  BP: (!) 121/48 (!) 124/52 (!) 117/46 105/66  Pulse: (!) 109 100 (!) 106 (!) 110  Resp: (!) 24 18 20    Temp: 98.9 F (37.2 C) 98.6 F (37 C) 99.9 F (37.7 C) 99.3 F (37.4 C)  TempSrc: Oral  Oral Oral  SpO2: 98% 98% 99% 99%  Weight:    130.5 kg  Height:        Intake/Output Summary (Last 24 hours) at 10/21/2019 0733 Last data filed at 10/20/2019 1744 Gross per 24 hour  Intake 527.84 ml  Output 800 ml  Net -272.16 ml   Filed Weights   10/18/19 0310 10/20/19 0434 10/21/19 0532  Weight: 128.6 kg 125.8 kg 130.5 kg    Physical Exam    GEN: Well nourished, well developed, with chest pain HEENT: normal.  Neck: Supple, no JVD, carotid bruits, or masses. Cardiac: irr, irr Respiratory:  Respirations regular and unlabored, clear to auscultation bilaterally. GI: Abdomen tender MS: no deformity or atrophy. Skin: warm and dry, no rash. Neuro:  Strength and sensation are intact.   Labs    CBC Recent Labs    10/20/19 0527 10/21/19 0600  WBC 7.8 7.3  HGB 8.0* 8.2*  HCT 25.3* 26.3*  MCV 97.7 98.9  PLT  255 147   Basic Metabolic Panel Recent Labs    10/20/19 0527 10/21/19 0600  NA 133* 135  K 4.1 4.6  CL 96* 100  CO2 30 27  GLUCOSE 158* 148*  BUN 25* 19  CREATININE 1.62* 1.37*  CALCIUM 8.2* 8.1*  MG 2.4 2.2   Liver Function Tests No results for input(s): AST, ALT, ALKPHOS, BILITOT, PROT, ALBUMIN in the last 72 hours. No results for input(s): LIPASE, AMYLASE in the last 72 hours. Cardiac Enzymes No results for input(s): CKTOTAL, CKMB, CKMBINDEX, TROPONINI in the last 72 hours. BNP No results for input(s): BNP in the last 72 hours. D-Dimer No results for input(s): DDIMER in the last 72 hours. Hemoglobin A1C No results for input(s): HGBA1C in the last 72 hours. Fasting Lipid Panel No  results for input(s): CHOL, HDL, LDLCALC, TRIG, CHOLHDL, LDLDIRECT in the last 72 hours. Thyroid Function Tests No results for input(s): TSH, T4TOTAL, T3FREE, THYROIDAB in the last 72 hours.  Invalid input(s): FREET3  Telemetry    afib  ECG    afib   Radiology    DG Chest 1 View  Result Date: 10/12/2019 CLINICAL DATA:  Shortness of breath. EXAM: CHEST  1 VIEW COMPARISON:  October 08, 2019 FINDINGS: There is a dual lead AICD. Mild diffusely increased interstitial lung markings are seen the which is predominant stable in severity when compared to the prior exam. Mild to moderate severity prominence of the perihilar pulmonary vasculature is noted which represents a new finding. There is no evidence of a pleural effusion or pneumothorax. Stable moderate to marked severity enlargement of the cardiac silhouette is seen. A radiopaque fixation plate and multiple fixation screws are seen within the proximal left humerus. IMPRESSION: 1. Stable cardiomegaly with mild to moderate severity pulmonary vascular congestion. Electronically Signed   By: Virgina Norfolk M.D.   On: 10/12/2019 02:13   DG Chest 2 View  Result Date: 10/08/2019 CLINICAL DATA:  Shortness of breath, open wounds to the posterior left lower leg and anterior right lower leg, history of asthma EXAM: CHEST - 2 VIEW COMPARISON:  Radiograph 08/08/2019, CT 09/15/2015 FINDINGS: Diffuse hazy basilar predominant interstitial opacities with some fissural thickening as well. More patchy retrocardiac infrahilar opacities in the lung bases. No pneumothorax or visible effusion. Prominent cardiac silhouette compatible with cardiomegaly seen on comparison studies. Pacer pack overlies the left chest wall with leads at the cardiac apex and right atrium in similar positioning to prior. Remaining cardiomediastinal contours are unremarkable. No acute osseous or soft tissue abnormality. Degenerative changes are present in the imaged spine and shoulders.  Prior left humeral ORIF is noted. IMPRESSION: 1. Features of CHF/volume overload with cardiomegaly and interstitial opacities suggesting edema. 2. More patchy retrocardiac and infrahilar opacities in the lung bases may reflect developing alveolar edema, atelectasis or airspace disease. Electronically Signed   By: Lovena Le M.D.   On: 10/08/2019 17:36   DG Tibia/Fibula Left  Result Date: 10/08/2019 CLINICAL DATA:  Open wound to posterior left lower leg. EXAM: LEFT TIBIA AND FIBULA - 2 VIEW COMPARISON:  None. FINDINGS: There is no evidence of fracture or other focal bone lesions. Mild diffuse soft tissue swelling is seen throughout the left calf with moderate severity soft tissue swelling also noted along the anterior aspect of the left tibia. No soft tissue air is seen. IMPRESSION: 1. Soft tissue swelling which may be secondary to diffuse cellulitis, without evidence of an acute osseous abnormality. Electronically Signed   By: Virgina Norfolk  M.D.   On: 10/08/2019 19:49   CT ABDOMEN PELVIS W CONTRAST  Result Date: 10/17/2019 CLINICAL DATA:  Upper abdominal pain. EXAM: CT ABDOMEN AND PELVIS WITH CONTRAST TECHNIQUE: Multidetector CT imaging of the abdomen and pelvis was performed using the standard protocol following bolus administration of intravenous contrast. CONTRAST:  <See Chart> OMNIPAQUE IOHEXOL 300 MG/ML SOLN, 57m OMNIPAQUE IOHEXOL 350 MG/ML SOLN COMPARISON:  August 04, 2019 FINDINGS: Lower chest: There is a small left-sided pleural effusion. There is bibasilar atelectasis.The heart is enlarged. Hepatobiliary: The liver is cirrhotic. Normal gallbladder.There is no biliary ductal dilation. Pancreas: Normal contours without ductal dilatation. No peripancreatic fluid collection. Spleen: Unremarkable. Adrenals/Urinary Tract: --Adrenal glands: Unremarkable. --Right kidney/ureter: No hydronephrosis or radiopaque kidney stones. --Left kidney/ureter: No hydronephrosis or radiopaque kidney stones. --Urinary  bladder: The bladder is decompressed by Foley catheter and therefore is poorly evaluated. Stomach/Bowel: --Stomach/Duodenum: There is a surgical clip within the proximal duodenum. --Small bowel: Unremarkable. --Colon: There is mild diffuse circumferential wall thickening of the sigmoid and descending colon. There is a metallic foreign body at the level of the proximal descending colon which may be related to recent intervention. --Appendix: Surgically absent. Vascular/Lymphatic: Atherosclerotic calcification is present within the non-aneurysmal abdominal aorta, without hemodynamically significant stenosis. There is evidence for portal hypertension with recanalization of the umbilical vein in addition to extensive portosystemic shunting. The portal vein appears patent. The SMV is patent. The splenic vein remains patent. There may be some small esophageal varices. --No retroperitoneal lymphadenopathy. --No mesenteric lymphadenopathy. --No pelvic or inguinal lymphadenopathy. Reproductive: Unremarkable Other: There is a small volume of abdominal ascites. The abdominal wall is normal. Musculoskeletal. No acute displaced fractures. IMPRESSION: 1. Mild diffuse circumferential wall thickening of the sigmoid and descending colon may be secondary to underdistention or mild colitis. 2. Cirrhosis with stigmata of portal hypertension including recanalization of the umbilical vein and extensive portosystemic shunting. 3. Small volume abdominal ascites. 4. Small left-sided pleural effusion with bibasilar atelectasis. 5. Cardiomegaly. Aortic Atherosclerosis (ICD10-I70.0). Electronically Signed   By: CConstance HolsterM.D.   On: 10/17/2019 16:41   DG Chest Port 1 View  Result Date: 10/16/2019 CLINICAL DATA:  Hypertension, CHF EXAM: PORTABLE CHEST 1 VIEW COMPARISON:  10/12/2019 FINDINGS: Mild bilateral interstitial thickening. No pleural effusion or pneumothorax. No focal consolidation. Stable cardiomegaly. Dual lead cardiac  pacemaker. No acute osseous abnormality. IMPRESSION: Cardiomegaly with mild pulmonary vascular congestion. Electronically Signed   By: HKathreen Devoid  On: 10/16/2019 15:37    Assessment & Plan    Principal Problem: (HFpEF) heart failure with preserved ejection fraction (HCC) Active Problems: Diabetes mellitus ([place] Benign essential HTN Benign prostatic hyperplasia with urinary obstruction Chronic atrial fibrillation (HCC) Morbid (severe) obesity due to excess calories (HCC) NASH (nonalcoholic steatohepatitis) Bilateral carotid artery stenosis CKD stage 3 due to type 2 diabetes mellitus ([place] Acute exacerbation of CHF (congestive heart failure) (HCC) Leg wound, left, initial encounter   1.Acute on chronic diastolic CHF,EF 634-19%;[id number] weight gain, progressive exertional dyspnea, peripheral edema, and orthopnea, with anasarc.Off of thelasix drip andwill resume iv lasixonce dailyfollowing hemodynamics and I/o.Chest x-ray showed cardiomegaly with mild pulmonary vascular congestion. Has diuresed 6.4L since admission. Creatinineslowlyimproved from yesterday down to 1.37 today. Continue to hold lasix for now.  2.Chronic atrial fibrillation, on metoprolol tartrate50 mg twice daily for rate control, warfarin on hold due tobleeding.Elevated ventricular rate to 110s-120s bpm ,RVR likely compensatorysecondary to GI bleed and anemia,patient asymptomatic. 3.Anemia,hemoglobin8.2, underwent colonoscopyearlier during his hospitalizationwhich revealed bleeding visible vessel second portion of  duodenum underwent hemostatic clips 5. NASH, 6. Chest pain Etiology unclear. Will get ekg. Previous similar pain did not appear to be ischemia Recommendations  1.Holding lasix due to diarrhea. Renal function improved. Willcontinue withmetoprolol 50 mg bid 2.Continue diuresisfollowing hemodynamics and electrolytes.  3.Carefully monitor renal  status 4.Monitor daily weights andI&O's 5.Defer further cardiac diagnostics at this time 6.Will review ekg when available.   Dr. Humphrey Rolls will be covering our patients this weekend.   Signed, Javier Docker Ely Ballen MD 10/21/2019, 7:33 AM  Pager: [phone]

## 2019-10-22 ENCOUNTER — Inpatient Hospital Stay: Payer: Medicare HMO

## 2019-10-22 DIAGNOSIS — K7581 Nonalcoholic steatohepatitis (NASH): Secondary | ICD-10-CM

## 2019-10-22 LAB — CBC
HCT: 28.1 % — ABNORMAL LOW (ref 39.0–52.0)
Hemoglobin: 8.8 g/dL — ABNORMAL LOW (ref 13.0–17.0)
MCH: 31.1 pg (ref 26.0–34.0)
MCHC: 31.3 g/dL (ref 30.0–36.0)
MCV: 99.3 fL (ref 80.0–100.0)
Platelets: 286 10*3/uL (ref 150–400)
RBC: 2.83 MIL/uL — ABNORMAL LOW (ref 4.22–5.81)
RDW: 20.8 % — ABNORMAL HIGH (ref 11.5–15.5)
WBC: 7.3 10*3/uL (ref 4.0–10.5)
nRBC: 0 % (ref 0.0–0.2)

## 2019-10-22 LAB — BASIC METABOLIC PANEL
Anion gap: 7 (ref 5–15)
BUN: 16 mg/dL (ref 8–23)
CO2: 28 mmol/L (ref 22–32)
Calcium: 8.1 mg/dL — ABNORMAL LOW (ref 8.9–10.3)
Chloride: 100 mmol/L (ref 98–111)
Creatinine, Ser: 1.41 mg/dL — ABNORMAL HIGH (ref 0.61–1.24)
GFR calc Af Amer: 58 mL/min — ABNORMAL LOW (ref >60)
GFR calc non Af Amer: 50 mL/min — ABNORMAL LOW (ref >60)
Glucose, Bld: 152 mg/dL — ABNORMAL HIGH (ref 70–99)
Potassium: 4.3 mmol/L (ref 3.5–5.1)
Sodium: 135 mmol/L (ref 135–145)

## 2019-10-22 LAB — GLUCOSE, CAPILLARY
Glucose-Capillary: 136 mg/dL — ABNORMAL HIGH (ref 70–99)
Glucose-Capillary: 219 mg/dL — ABNORMAL HIGH (ref 70–99)
Glucose-Capillary: 196 mg/dL — ABNORMAL HIGH (ref 70–99)
Glucose-Capillary: 176 mg/dL — ABNORMAL HIGH (ref 70–99)

## 2019-10-22 LAB — PROTIME-INR
INR: 1.6 — ABNORMAL HIGH (ref 0.8–1.2)
Prothrombin Time: 18.2 seconds — ABNORMAL HIGH (ref 11.4–15.2)

## 2019-10-22 LAB — MAGNESIUM: Magnesium: 2.2 mg/dL (ref 1.7–2.4)

## 2019-10-22 MED ORDER — FUROSEMIDE 20 MG PO TABS
20.0000 mg | ORAL_TABLET | Freq: Every day | ORAL | Status: DC
  Administered 2019-10-22 – 2019-10-23 (×2): 20 mg via ORAL
  Filled 2019-10-22 (×2): qty 1

## 2019-10-22 MED ORDER — TRAZODONE HCL 50 MG PO TABS: 50.0000 mg | ORAL_TABLET | Freq: Every evening | ORAL | Status: DC | PRN

## 2019-10-22 MED ORDER — SIMETHICONE 80 MG PO CHEW
80.0000 mg | CHEWABLE_TABLET | Freq: Four times a day (QID) | ORAL | Status: DC | PRN
  Administered 2019-10-22 (×2): 80 mg via ORAL
  Filled 2019-10-22 (×3): qty 1

## 2019-10-22 NOTE — Progress Notes (Addendum)
PROGRESS NOTE    [name]   OIT:[id number]  DOB: [birth year]  PCP: Birdie Sons, MD    DOA: 10/08/2019 LOS: 42   Brief Narrative   HPI per Dr. Gala Romney on 10/09/19 "Christian Berg is a 69 y.o. male with medical history significant of diabetes, hypertension, diastolic dysfunction CHF, disseminated superficial actinic porokeratosis, degenerative disc disease, recent bleeding episodes, atrial fibrillation and peripheral vascular disease who presented to the ER with left lower extremity ulcer.  He already has a right lower extremity ulcer.  He came in for wound check.  Patient has noted increasing weight about 25 pounds more in the last few weeks.  He is taking diuretics at home.  He also has noted increasing shortness of breath.  He has bruising easily and was told to stop his Coumadin about a week ago.  Patient was seen in the ER in addition to his leg wounds was found to be having episode of anasarca.  Appears he has acute exacerbation of his diastolic CHF so he is being admitted to the hospital for further evaluation and treatment..   ED Course: Temperature is 99.1 blood pressure 130/47 pulse 140 respirate 26 oxygen sats 94% on room air.  White count 9.0 hemoglobin 9.3 and platelet 235.  Chemistry appears to be within normal except for creatinine 1.38 and calcium 8.4.  Glucose 145.  COVID-19 screen is negative.  Chest x-ray showed evidence of mild fluid overload.  X-ray of the tibia-fibula shows no evidence of osseous abnormality.  Patient being admitted with acute exacerbation of CHF and also possible infected wound."     Lower extremity cellulitis treated with Cefepime. Developed diarrhea initially attributed to antibiotic.  Diuresed with IV Lasix, including infusion.  Cardiology consulted.  Lasix eventually stopped due to soft Bp's.    GI Bleeding with positive FOBT.  GI consulted.  EGD on 9/16 showed actively bleeding duodenal ulcer which was clipped.  Received 1 unit  pRBC transfusion. Hemoglobin has since remained stable.   9/19-20 - increasing frequency of watery stools.  Patient pan-cultured and checked for C diff which returned positive.  Started on oral vancomycin.           Assessment & Plan   Principal Problem:   (HFpEF) heart failure with preserved ejection fraction (HCC) Active Problems:   Diabetes mellitus (HCC)   Benign essential HTN   Benign prostatic hyperplasia with urinary obstruction   Chronic atrial fibrillation (HCC)   Morbid (severe) obesity due to excess calories (HCC)   NASH (nonalcoholic steatohepatitis)   Bilateral carotid artery stenosis   CKD stage 3 due to type 2 diabetes mellitus (HCC)   Acute exacerbation of CHF (congestive heart failure) (HCC)   Leg wound, left, initial encounter   C. difficile colitis   C. Diff colitis - on PO Vancomycin (9/20>> ).  Continues to have frequent large volume diarrhea. 9/20 reported upper abdominal pain, mild tenderness to palpation, and increased watery stool output, rising white count.  CT abdomen/pelvis showed mild colitis.  C diff returned positive.  Hemodynamically stable. --continue oral vancomycin --Repeating CT scan today given distention and abdominal pain -pending  Abdominal pain and distention -persistent abdominal pain despite several days of treatment for C. difficile.  Abdomen is more distended today.  Discussed with radiologist and will get noncontrast CT abdomen pelvis for further evaluation.  Concern for C. difficile complications developing although he otherwise appears clinically stable and afebrile. --Monitor abdominal exam closely --Trial of simethicone  AKI superimposed on CKD stage IIIa - AKI not present on admission, secondary to diarrhea/hypovolemia with C diff.  Baseline creatinine appears to be around 1.3to 1.4.    Monitor BMP.  Hold lisinopril, spironolactone.  Resume home Lasix (getting some edema and required lasix gtt earlier this admission).  Acute  on Chronic Diastolic Congestive Heart Failure - presented with severe volume overload with anasarca. BNP on admission was 399.1.  Prior echo from 08/05/2019, EF normal.  --Cardiology consulted --Was on Lasix infusion, then diuretics held due to AKI.  PO Lasix resumed 9/25.     --Monitor volume status   Acute blood loss anemia secondary to Duodenal ulcer - EGD 9/16, actively bleeding lesion clipped.  Hbg stable since clipping and transfusions of RBC's on 9/16 and 9/19.   1 U transfused 9/16, pt tolerated well. Another unit transfused 9/19 --Completed  5 days ceftriaxone for sbp ppx --monitor Hbg, transfuse as needed if Hbg <7 --page GI if recurrent bleeding   Generalized weakness - due to prolonged and complicated hospital admission, multiple acute illnesses.  PT and OT recommending SNF, but patient refuses.  TOC following to arrange home health services.     Left leg wound with cellulitis in the setting of DSAP - Resolved, treated   Chronic Atrial fibrillation with rapid ventricular response - now rate controlled.   --Patient taken off of warfarin recently due to concern of skin bleeding --holding on anticoagulation given GI bleed this admission  Hypokalemia - recurrent due to diarrhea. Replaced.  Monitor and replace as needed. Maintain K>4.0, Mg>2.0.  Type 2 Diabetes Mellitus - Hold Metformin.  Sliding scale Novolog.  Lantus 10 units qHS.  NASH liver cirrhosis - LFT's stable, mild Tbili elevation.  No acute issues.  Monitor.  Resumed on Lasix.  Spironolactone still on hold.  Abdomen more distended today 9/25.  EssentialHypertension - chronic, stable.  Continue Lopressor BID.  Monitor BP and maintain MAP>65. Continue to hold lisinopril, spironolactone given soft BP's.  Resume Lasix, getting some edema.   Morbid Obesity - Body mass index is 43.32 kg/m.   Complicates overall care and prognosis.  Counseled regarding lifestyle modifications including diet and physical  activity.    DVT prophylaxis: Place TED hose Start: 10/13/19 1600 SCDs Start: 10/09/19 0223   Diet:  Diet Orders (From admission, onward)    Start     Ordered   10/18/19 1501  Diet 2 gram sodium Room service appropriate? Yes; Fluid consistency: Thin; Fluid restriction: 1200 mL Fluid  Diet effective now       Question Answer Comment  Room service appropriate? Yes   Fluid consistency: Thin   Fluid restriction: 1200 mL Fluid      10/18/19 1501            Code Status: Full Code    Subjective 10/22/19    Patient seen this AM at bedside this morning.  Reports ongoing persistent abdominal pain that he rates as 5 out of 10 in severity, waxing and waning.  Frequency of stools has decreased but is still watery diarrhea.  Abdomen feels harder today than previously.  He really wants to go home today and gets tearful in discussing the couple things we need to do before he is ready for discharge including removal of Foley and imaging for his abdomen given the pain, need to ensure no C. difficile complications.  He says that again last night he waited quite a long time after having bowel movement needed to be cleaned.  He gets tearful in discussing this as well.  Says his bottom is still painful.  Met with wife in the hallway shortly after seen the patient.  Patient has reported multiple times to her significant delays with cleaning him up at night, making his skin irritation worse and more painful.   Disposition Plan & Communication   Status is: Inpatient  Remains inpatient appropriate because:Inpatient level of care appropriate due to severity of illness  With ongoing frequent watery diarrhea due to C diff.  Requires SNF vs full scope of [place] services.  Dispo: The patient is from: Home              Anticipated d/c is to: Home              Anticipated d/c date is: 2 days              Patient currently is not medically stable to d/c.    Family Communication: spoke to wife by phone today  9/24.   Consults, Procedures, Significant Events   Consultants:   Cardiology  Gastroenterology  Procedures:   EGD 10/13/19   Antimicrobials:  Anti-infectives (From admission, onward)   Start     Dose/Rate Route Frequency Ordered Stop   10/17/19 1800  vancomycin (VANCOCIN) 50 mg/mL oral solution 125 mg        125 mg Oral 4 times daily 10/17/19 1708 10/27/19 1759   10/16/19 1400  cefTRIAXone (ROCEPHIN) 1 g in sodium chloride 0.9 % 100 mL IVPB        1 g 200 mL/hr over 30 Minutes Intravenous Every 24 hours 10/15/19 1508 10/17/19 0850   10/12/19 1400  cefTRIAXone (ROCEPHIN) 2 g in sodium chloride 0.9 % 100 mL IVPB  Status:  Discontinued        2 g 200 mL/hr over 30 Minutes Intravenous Every 24 hours 10/12/19 1327 10/15/19 1508   10/11/19 2100  ceFEPIme (MAXIPIME) 2 g in sodium chloride 0.9 % 100 mL IVPB  Status:  Discontinued        2 g 200 mL/hr over 30 Minutes Intravenous Every 8 hours 10/10/19 1853 10/10/19 1859   10/10/19 1900  ceFEPIme (MAXIPIME) 2 g in sodium chloride 0.9 % 100 mL IVPB  Status:  Discontinued        2 g 200 mL/hr over 30 Minutes Intravenous Every 8 hours 10/10/19 1859 10/12/19 1215   10/09/19 0000  ceFEPIme (MAXIPIME) 2 g in sodium chloride 0.9 % 100 mL IVPB  Status:  Discontinued        2 g 200 mL/hr over 30 Minutes Intravenous Every 8 hours 10/08/19 2346 10/10/19 1853         Objective   Vitals:   10/22/19 0436 10/22/19 0903 10/22/19 1247 10/22/19 1606  BP:  (!) 112/58 92/77 120/76  Pulse:  (!) 108 87 80  Resp:  _0 Temp:  98.3 F (36.8 C)  97.9 F (36.6 C)  TempSrc:  Oral    SpO2:  95% 97% 99%  Weight: 129.2 kg     Height:        Intake/Output Summary (Last 24 hours) at 10/22/2019 1629 Last data filed at 10/22/2019 0403 Gross per 24 hour  Intake --  Output 1001 ml  Net -1001 ml   Filed Weights   10/20/19 0434 10/21/19 0532 10/22/19 0436  Weight: 125.8 kg 130.5 kg 129.2 kg    Physical Exam:  General exam: awake, alert, no  acute distress, obese  Respiratory system: CTAB, normal respiratory effort. Cardiovascular system: normal S1/S2, RRR, no pedal edema.   Gastrointestinal system: Abdomen has been entirely soft but today feels harder and somewhat distended, no guarding or rebound tenderness, positive bowel sounds Extremities: no LE edema, scattered circular lesions stable Psychiatry: Depressed mood, congruent affect, judgement and insight appear normal  Labs   Data Reviewed: I have personally reviewed following labs and imaging studies  CBC: Recent Labs  Lab 10/18/19 0636 10/19/19 0701 10/20/19 0527 10/21/19 0600 10/22/19 0608  WBC 15.8* 11.5* 7.8 7.3 7.3  HGB 8.6* 8.7* 8.0* 8.2* 8.8*  HCT 26.6* 26.2* 25.3* 26.3* 28.1*  MCV 96.7 94.6 97.7 98.9 99.3  PLT 260 277 255 262 676   Basic Metabolic Panel: Recent Labs  Lab 10/18/19 0636 10/19/19 0701 10/20/19 0527 10/21/19 0600 10/22/19 0608  NA 133* 137 133* 135 135  K 3.6 3.8 4.1 4.6 4.3  CL 93* 97* 96* 100 100  CO2 _0 GLUCOSE 145* 113* 158* 148* 152*  BUN 26* 25* 25* 19 16  CREATININE 1.38* 1.36* 1.62* 1.37* 1.41*  CALCIUM 7.8* 8.0* 8.2* 8.1* 8.1*  MG 2.0 2.2 2.4 2.2 2.2   GFR: Estimated Creatinine Clearance: 63.9 mL/min (A) (by C-G formula based on SCr of 1.41 mg/dL (H)). Liver Function Tests: Recent Labs  Lab 10/16/19 0439 10/16/19 1842 10/17/19 0614 10/18/19 0628  AST 41 41 34 35  ALT _1 ALKPHOS 88 97 85 90  BILITOT 1.1 1.2 1.4*  1.5* 1.4*  PROT 6.4* 6.9 6.4* 6.2*  ALBUMIN 2.2* 2.3* 2.2* 2.1*   Recent Labs  Lab 10/17/19 0614  LIPASE 38   No results for input(s): AMMONIA in the last 168 hours. Coagulation Profile: Recent Labs  Lab 10/18/19 0636 10/19/19 0701 10/20/19 0527 10/21/19 0600 10/22/19 0608  INR 1.8* 1.7* 1.7* 1.7* 1.6*   Cardiac Enzymes: No results for input(s): CKTOTAL, CKMB, CKMBINDEX, TROPONINI in the last 168 hours. BNP (last 3 results) No results for input(s): PROBNP in the  last 8760 hours. HbA1C: No results for input(s): HGBA1C in the last 72 hours. CBG: Recent Labs  Lab 10/21/19 1133 10/21/19 1616 10/21/19 2040 10/22/19 0900 10/22/19 1244  GLUCAP 180* 226* 235* 136* 219*   Lipid Profile: No results for input(s): CHOL, HDL, LDLCALC, TRIG, CHOLHDL, LDLDIRECT in the last 72 hours. Thyroid Function Tests: No results for input(s): TSH, T4TOTAL, FREET4, T3FREE, THYROIDAB in the last 72 hours. Anemia Panel: No results for input(s): VITAMINB12, FOLATE, FERRITIN, TIBC, IRON, RETICCTPCT in the last 72 hours. Sepsis Labs: Recent Labs  Lab 10/16/19 1843  LATICACIDVEN 2.4*    Recent Results (from the past 240 hour(s))  CULTURE, BLOOD (ROUTINE X 2) w Reflex to ID Panel     Status: None   Collection Time: 10/16/19 12:21 PM   Specimen: BLOOD  Result Value Ref Range Status   Specimen Description BLOOD [facility]  Final   Special Requests   Final    BOTTLES DRAWN AEROBIC AND ANAEROBIC Blood Culture adequate volume   Culture   Final    NO GROWTH 5 DAYS Performed at [facility], [address] [place],  [address]    Report Status 10/21/2019 FINAL  Final  CULTURE, BLOOD (ROUTINE X 2) w Reflex to ID Panel     Status: None   Collection Time: 10/16/19 12:32 PM   Specimen: BLOOD  Result Value Ref Range Status   Specimen Description BLOOD [facility]  Final  Special Requests   Final    BOTTLES DRAWN AEROBIC ONLY Blood Culture adequate volume   Culture   Final    NO GROWTH 5 DAYS Performed at [facility], [address] [place], [place] [address]    Report Status 10/21/2019 FINAL  Final  Urine Culture     Status: None   Collection Time: 10/16/19  3:34 PM   Specimen: Urine, Random  Result Value Ref Range Status   Specimen Description   Final    URINE, RANDOM Performed at [facility], [address], [place], [place] [address]    Special Requests   Final    NONE Performed at [facility], [address]  [place], [place] [address]    Culture   Final    NO GROWTH Performed at [facility], [place] [address] [place], [place] [address]    Report Status 10/17/2019 FINAL  Final  C Difficile Quick Screen (NO PCR Reflex)     Status: Abnormal   Collection Time: 10/17/19  8:15 PM   Specimen: STOOL  Result Value Ref Range Status   C Diff antigen POSITIVE (A) NEGATIVE Final   C Diff toxin POSITIVE (A) NEGATIVE Final   C Diff interpretation Toxin producing C. difficile detected.  Final    Comment: CRITICAL RESULT CALLED TO, READ BACK BY AND VERIFIED WITH: DENISE [facility] 10/17/19 AT 2127 BY ACR Performed at [facility], [address], [place], [place] [address]       Imaging Studies   No results found.   Medications   Scheduled Meds: . sodium chloride   Intravenous Once  . Chlorhexidine Gluconate Cloth  6 each Topical Daily  . feeding supplement (NEPRO CARB STEADY)  237 mL Oral BID BM  . Gerhardt's butt cream   Topical TID  . hydrocerin   Topical Daily  . insulin aspart  0-15 Units Subcutaneous TID WC  . insulin glargine  10 Units Subcutaneous QHS  . metoprolol tartrate  50 mg Oral BID  . multivitamin with minerals  1 tablet Oral Daily  . pantoprazole (PROTONIX) IV  40 mg Intravenous Q12H  . pravastatin  20 mg Oral q1800  . sodium chloride flush  3 mL Intravenous Q12H  . vancomycin  125 mg Oral QID   Continuous Infusions: . sodium chloride         LOS: 14 days    Time spent: 30 minutes with > 50% spent in coordination of care and direct patient contact.    Ezekiel Slocumb, DO [facility]  10/22/2019, 4:29 PM    If 7PM-7AM, please contact night-coverage. How to contact the [facility] Attending or Consulting provider [place] or covering provider during after hours [place], for this patient?    1. Check the care team in [facility] and look for a) attending/consulting TR[facility] provider listed and b) the [facility] team listed 2. Log into [email or web address] and use 's  universal password to access. If you do not have the password, please contact the hospital operator. 3. Locate the [facility] provider you are looking for under [facility] and page to a number that you can be directly reached. 4. If you still have difficulty reaching the provider, please page the [facility] (Director on Call) for the Hospitalists listed on amion for assistance.

## 2019-10-22 NOTE — Progress Notes (Signed)
SUBJECTIVE: Feeling much better.   Vitals:   10/22/19 0321 10/22/19 0436 10/22/19 0903 10/22/19 1247  BP: 125/82  (!) 112/58 92/77  Pulse: 100  (!) 108 87  Resp: 19  19 18   Temp: 98.5 F (36.9 C)  98.3 F (36.8 C)   TempSrc: Oral  Oral   SpO2: 97%  95% 97%  Weight:  129.2 kg    Height:        Intake/Output Summary (Last 24 hours) at 10/22/2019 1426 Last data filed at 10/22/2019 0403 Gross per 24 hour  Intake --  Output 1001 ml  Net -1001 ml    LABS: Basic Metabolic Panel: Recent Labs    10/21/19 0600 10/22/19 0608  NA 135 135  K 4.6 4.3  CL 100 100  CO2 27 28  GLUCOSE 148* 152*  BUN 19 16  CREATININE 1.37* 1.41*  CALCIUM 8.1* 8.1*  MG 2.2 2.2   Liver Function Tests: No results for input(s): AST, ALT, ALKPHOS, BILITOT, PROT, ALBUMIN in the last 72 hours. No results for input(s): LIPASE, AMYLASE in the last 72 hours. CBC: Recent Labs    10/21/19 0600 10/22/19 0608  WBC 7.3 7.3  HGB 8.2* 8.8*  HCT 26.3* 28.1*  MCV 98.9 99.3  PLT 262 286   Cardiac Enzymes: No results for input(s): CKTOTAL, CKMB, CKMBINDEX, TROPONINI in the last 72 hours. BNP: Invalid input(s): POCBNP D-Dimer: No results for input(s): DDIMER in the last 72 hours. Hemoglobin A1C: No results for input(s): HGBA1C in the last 72 hours. Fasting Lipid Panel: No results for input(s): CHOL, HDL, LDLCALC, TRIG, CHOLHDL, LDLDIRECT in the last 72 hours. Thyroid Function Tests: No results for input(s): TSH, T4TOTAL, T3FREE, THYROIDAB in the last 72 hours.  Invalid input(s): FREET3 Anemia Panel: No results for input(s): VITAMINB12, FOLATE, FERRITIN, TIBC, IRON, RETICCTPCT in the last 72 hours.   PHYSICAL EXAM General: Well developed, well nourished, in no acute distress HEENT:  Normocephalic and atramatic Neck:  No JVD.  Lungs: Clear bilaterally to auscultation and percussion. Heart: HRRR . Normal S1 and S2 without gallops or murmurs.  Abdomen: Bowel sounds are positive, abdomen soft and  non-tender  Msk:  Back normal, normal gait. Normal strength and tone for age. Extremities: No clubbing, cyanosis or edema.   Neuro: Alert and oriented X 3. Psych:  Good affect, responds appropriately  TELEMETRY: A. fib  ASSESSMENT AND PLAN: Heart failure with preserved ejection fraction and chronic A. fib.  Appears comfortable today and wants to go home.  Patient can be discharged with follow-up in 1 to 2 weeks with Dr. Ubaldo Glassing.  Principal Problem:   (HFpEF) heart failure with preserved ejection fraction (HCC) Active Problems:   Diabetes mellitus (HCC)   Benign essential HTN   Benign prostatic hyperplasia with urinary obstruction   Chronic atrial fibrillation (HCC)   Morbid (severe) obesity due to excess calories (HCC)   NASH (nonalcoholic steatohepatitis)   Bilateral carotid artery stenosis   CKD stage 3 due to type 2 diabetes mellitus (HCC)   Acute exacerbation of CHF (congestive heart failure) ([place])   Leg wound, left, initial encounter   C. difficile colitis    Neoma Laming A, MD, [facility] 10/22/2019 2:26 PM

## 2019-10-23 ENCOUNTER — Encounter: Payer: Self-pay | Admitting: Internal Medicine

## 2019-10-23 LAB — BASIC METABOLIC PANEL
Anion gap: 7 (ref 5–15)
BUN: 16 mg/dL (ref 8–23)
CO2: 27 mmol/L (ref 22–32)
Calcium: 8 mg/dL — ABNORMAL LOW (ref 8.9–10.3)
Chloride: 100 mmol/L (ref 98–111)
Creatinine, Ser: 1.44 mg/dL — ABNORMAL HIGH (ref 0.61–1.24)
GFR calc Af Amer: 57 mL/min — ABNORMAL LOW (ref >60)
GFR calc non Af Amer: 49 mL/min — ABNORMAL LOW (ref >60)
Glucose, Bld: 151 mg/dL — ABNORMAL HIGH (ref 70–99)
Potassium: 4.1 mmol/L (ref 3.5–5.1)
Sodium: 134 mmol/L — ABNORMAL LOW (ref 135–145)

## 2019-10-23 LAB — PROTIME-INR
INR: 1.7 — ABNORMAL HIGH (ref 0.8–1.2)
Prothrombin Time: 19 seconds — ABNORMAL HIGH (ref 11.4–15.2)

## 2019-10-23 LAB — CBC
HCT: 27.5 % — ABNORMAL LOW (ref 39.0–52.0)
Hemoglobin: 8.5 g/dL — ABNORMAL LOW (ref 13.0–17.0)
MCH: 30.6 pg (ref 26.0–34.0)
MCHC: 30.9 g/dL (ref 30.0–36.0)
MCV: 98.9 fL (ref 80.0–100.0)
Platelets: 250 10*3/uL (ref 150–400)
RBC: 2.78 MIL/uL — ABNORMAL LOW (ref 4.22–5.81)
RDW: 20.2 % — ABNORMAL HIGH (ref 11.5–15.5)
WBC: 6.9 10*3/uL (ref 4.0–10.5)
nRBC: 0 % (ref 0.0–0.2)

## 2019-10-23 LAB — GLUCOSE, CAPILLARY
Glucose-Capillary: 123 mg/dL — ABNORMAL HIGH (ref 70–99)
Glucose-Capillary: 136 mg/dL — ABNORMAL HIGH (ref 70–99)

## 2019-10-23 LAB — MAGNESIUM: Magnesium: 2.1 mg/dL (ref 1.7–2.4)

## 2019-10-23 MED ORDER — HYDROCERIN EX CREA: 1.0000 "application " | TOPICAL_CREAM | Freq: Every day | CUTANEOUS | 0 refills | Status: DC

## 2019-10-23 MED ORDER — GERHARDT'S BUTT CREAM: 1.0000 "application " | TOPICAL_CREAM | Freq: Three times a day (TID) | CUTANEOUS | 2 refills | Status: DC

## 2019-10-23 MED ORDER — PANTOPRAZOLE SODIUM 40 MG PO TBEC: 40.0000 mg | DELAYED_RELEASE_TABLET | Freq: Two times a day (BID) | ORAL | 1 refills | Status: DC

## 2019-10-23 MED ORDER — INSULIN GLARGINE 100 UNIT/ML SOLOSTAR PEN: 10.0000 [IU] | PEN_INJECTOR | Freq: Every day | SUBCUTANEOUS | 1 refills | Status: DC

## 2019-10-23 MED ORDER — METOPROLOL TARTRATE 50 MG PO TABS: 50.0000 mg | ORAL_TABLET | Freq: Two times a day (BID) | ORAL | 1 refills | Status: AC

## 2019-10-23 MED ORDER — SIMETHICONE 80 MG PO CHEW: 80.0000 mg | CHEWABLE_TABLET | Freq: Four times a day (QID) | ORAL | 0 refills | Status: DC | PRN

## 2019-10-23 MED ORDER — TRAZODONE HCL 50 MG PO TABS: 50.0000 mg | ORAL_TABLET | Freq: Every evening | ORAL | 0 refills | Status: DC | PRN

## 2019-10-23 MED ORDER — VANCOMYCIN HCL 125 MG PO CAPS
125.0000 mg | ORAL_CAPSULE | Freq: Four times a day (QID) | ORAL | 0 refills | Status: DC
Start: 2019-10-23 — End: 2019-10-31

## 2019-10-23 MED ORDER — ADULT MULTIVITAMIN W/MINERALS CH: 1.0000 | ORAL_TABLET | Freq: Every day | ORAL | Status: AC

## 2019-10-23 NOTE — TOC Transition Note (Signed)
Transition of Care [facility]) - CM/SW Discharge Note   Patient Details  Name: Christian Berg MRN: [id number] Date of Birth: [birth year]  Transition of Care [facility]) CM/SW Contact:  Meriel Flavors, LCSW Phone Number: 10/23/2019, 12:56 PM   Clinical Narrative:    CSW spoke with patient and [name], his wife. They are refusing SNF so he is set up with [facility] for HH/PT. DME has to be ordered and delivered to house due to needing a bariatric size. [employer] has ordered and will deliver. Dr. Arbutus Ped recommended patient transport home via EMS, Patient's wife refusing, stating she will transport patient in their truck. Dr. Arbutus Ped made aware.     Barriers to Discharge: Continued Medical Work up   Patient Goals and CMS Choice Patient states their goals for this hospitalization and ongoing recovery are:: Get back home      Discharge Placement                       Discharge Plan and Services                          HH Arranged: RN, PT [facility] Agency: [facility] (formerly [employer]) Date [place]: 10/12/19   Representative spoke with at [place]: [place] ([place]) Interventions     Readmission Risk Interventions Readmission Risk Prevention Plan 10/12/2019  Transportation Screening Complete  PCP or Specialist Appt within 3-5 Days (No Data)  [place] or [place] Complete  Palliative Care Screening Not Applicable  Medication Review (RN Care Manager) Complete  Some recent data might be hidden

## 2019-10-23 NOTE — Progress Notes (Signed)
Pt MASD to sacrum is improving.  RN will continue to monitor and follow [place] nursing recommendations including:  Topical care will be with Gerhart's Butt Cream, a compounded 1:1:1 hydrocortisone, lotrimin and zinc oxide preparation applied three times daily and PRN fecal incontinence.  Justice Britain, RN 10/23/2019.10:20 AM

## 2019-10-23 NOTE — Discharge Instructions (Signed)

## 2019-10-23 NOTE — Discharge Summary (Signed)
Physician Discharge Summary  Christian Berg FGH:[id number] DOB: Nov 02, 1950 DOA: 10/08/2019  PCP: Birdie Sons, MD  Admit date: 10/08/2019 Discharge date: 10/23/2019  Admitted From: home Disposition:  home  Recommendations for Outpatient Follow-up:  1. Follow up with PCP in 1-2 weeks 2. Please obtain BMP/CBC in one week 3. Please follow up with cardiology, Dr. Ubaldo Glassing in 1-2 weeks 4. Please follow up with gastroenterology, Dr. Alice Reichert  5. Follow up on blood glucose control and diabetes regimen.  Discharged with Lantus in addition to his metformin.  Home Health: PT, OT, RN, Aide, SW  Equipment/Devices: 3-n-1, wheelchair  Discharge Condition: Stable  CODE STATUS: Full  Diet recommendation: Heart Healthy / Carb Modified    Discharge Diagnoses: Principal Problem:   (HFpEF) heart failure with preserved ejection fraction ([place]) Active Problems:   Diabetes mellitus ([place])   Benign essential HTN   Benign prostatic hyperplasia with urinary obstruction   Chronic atrial fibrillation (HCC)   Morbid (severe) obesity due to excess calories (HCC)   NASH (nonalcoholic steatohepatitis)   Bilateral carotid artery stenosis   CKD stage 3 due to type 2 diabetes mellitus ([place])   Acute exacerbation of CHF (congestive heart failure) ([place])   Leg wound, left, initial encounter   C. difficile colitis    Summary of HPI and Hospital Course:  HPI per Dr. Gala Romney on 10/09/19 "Christian Berg is a 69 y.o. male with medical history significant of diabetes, hypertension, diastolic dysfunction CHF, disseminated superficial actinic porokeratosis, degenerative disc disease, recent bleeding episodes, atrial fibrillation and peripheral vascular disease who presented to the ER with left lower extremity ulcer.  He already has a right lower extremity ulcer.  He came in for wound check.  Patient has noted increasing weight about 25 pounds more in the last few weeks.  He is taking diuretics at home.  He also has  noted increasing shortness of breath.  He has bruising easily and was told to stop his Coumadin about a week ago.  Patient was seen in the ER in addition to his leg wounds was found to be having episode of anasarca.  Appears he has acute exacerbation of his diastolic CHF so he is being admitted to the hospital for further evaluation and treatment..   ED Course: Temperature is 99.1 blood pressure 130/47 pulse 140 respirate 26 oxygen sats 94% on room air.  White count 9.0 hemoglobin 9.3 and platelet 235.  Chemistry appears to be within normal except for creatinine 1.38 and calcium 8.4.  Glucose 145.  COVID-19 screen is negative.  Chest x-ray showed evidence of mild fluid overload.  X-ray of the tibia-fibula shows no evidence of osseous abnormality.  Patient being admitted with acute exacerbation of CHF and also possible infected wound."     Lower extremity cellulitis treated with Cefepime. Developed diarrhea initially attributed to antibiotic.  Diuresed with IV Lasix, including infusion.  Cardiology consulted.  Lasix eventually stopped due to soft Bp's.    GI Bleeding with positive FOBT.  GI consulted.  EGD on 9/16 showed actively bleeding duodenal ulcer which was clipped.  Received 1 unit pRBC transfusion. Hemoglobin has since remained stable.   9/19-20 - increasing frequency of watery stools.  Patient pan-cultured and checked for C diff which returned positive.  Started on oral vancomycin.         C. Diff colitis - on PO Vancomycin (9/20>> 10 day course ).  9/20 reported upper abdominal pain, mild tenderness to palpation, and increased watery stool output,  rising white count.  CT abdomen/pelvis showed mild colitis.  ' C diff returned positive.    AKI superimposed on CKD stage IIIa - AKI not present on admission, secondary to diarrhea/hypovolemia with C diff.   Improved with very gentle IV fluids.   Held lisinopril, spironolactone, Lasix (later resumed due to edema).   Acute on Chronic  Diastolic Congestive Heart Failure - presented with severe volume overload with anasarca. BNP on admission was 399.1.   Prior echo from 08/05/2019, EF normal.  --Cardiology was consulted --Required Lasix infusion >> AKI >> diuresis held, euvolemic. --PO Lasix was resumed.    Acute blood loss anemia secondary to Duodenal ulcer - EGD 9/16, actively bleeding lesion clipped.  Hbg stable since clipping and transfusions of RBC's on 9/16 and 9/19.   1 unit pRBC's transfused 9/16 and again on 9/19. --Completed  5 days ceftriaxone for sbp ppx --monitor Hbg, transfuse as needed if Hbg <7   Generalized weakness - due to prolonged and complicated hospital admission, multiple acute illnesses.  PT and OT recommending SNF, but patient refuses.   TOC arranged home health services, wheelchair and 3-n-1 bedside commode.  Wife declined EMS or taxi transport home.   Left leg wound with cellulitis in the setting of DSAP - Resolved with antibiotics.   Chronic Atrial fibrillation with rapid ventricular response - now rate controlled.    Patient taken off of warfarin recently due to concern of skin bleeding.  Anticoagulation HELD given GI bleed this admission.  Rate now controlled.    Hypokalemia - recurrent due to diarrhea.  Replaced.  Monitor, maintain K>4.0  Type 2 Diabetes Mellitus - Hold Metformin.  Sliding scale Novolog.  Lantus 10 units qHS - was prescribed long acting insulin on discharge for home use.  NASH liver cirrhosis - LFT's stable, mild Tbili elevation.  No acute issues.  .  EssentialHypertension - chronic, stable.  Continue Lopressor BID.  Held lisinopril, spironolactone, Lasix due to soft BP's. Lasix was resumed.   Morbid Obesity - Body mass index is 43.74 kg/m.   Complicates overall care and prognosis.  Counseled regarding lifestyle modifications including diet and physical activity.     Discharge Instructions   Discharge Instructions    (HEART FAILURE PATIENTS)  Call MD:  Anytime you have any of the following symptoms: 1) 3 pound weight gain in 24 hours or 5 pounds in 1 week 2) shortness of breath, with or without a dry hacking cough 3) swelling in the hands, feet or stomach 4) if you have to sleep on extra pillows at night in order to breathe.   Complete by: As directed    Call MD for:  extreme fatigue   Complete by: As directed    Call MD for:  persistant dizziness or light-headedness   Complete by: As directed    Call MD for:  persistant nausea and vomiting   Complete by: As directed    Call MD for:  redness, tenderness, or signs of infection (pain, swelling, redness, odor or green/yellow discharge around incision site)   Complete by: As directed    Call MD for:  severe uncontrolled pain   Complete by: As directed    Call MD for:  temperature >100.4   Complete by: As directed    Diet - low sodium heart healthy   Complete by: As directed    Discharge instructions   Complete by: As directed    For your C diff infection - you have 5 more days  of Vancomycin to take FOUR times daily.  Please do not miss any doses and take until the prescription is gone.  Call your primary care doctor if your diarrhea becomes more frequent again, or stools become more watery again.   At home, wash hands frequently with soap and water.    For abdominal pain - use GasEx (simethicone) as needed.    For irritated skin on your bottom - continue topical care as we have been doing here: Apply Gerhart's Butt Cream (a compounded 1:1:1 hydrocortisone, lotrimin and zinc oxide preparation) THREE times daily and as needed for any fecal incontinence.  Your warfarin and aspirin are currently stopped due to the bleeding ulcer you had.  Your cardiologist and GI doctor can decide when it is okay for these to be restarted.   Discharge wound care:   Complete by: As directed    As above   Increase activity slowly   Complete by: As directed      Allergies as of 10/23/2019       Reactions   Clindamycin/lincomycin    Chest discomfort   Sulfa Antibiotics Hives   Sulfasalazine Hives      Medication List    STOP taking these medications   aspirin EC 81 MG tablet   cephALEXin 500 MG capsule Commonly known as: KEFLEX   lisinopril 20 MG tablet Commonly known as: ZESTRIL   nystatin cream Commonly known as: MYCOSTATIN   spironolactone 25 MG tablet Commonly known as: ALDACTONE   warfarin 5 MG tablet Commonly known as: COUMADIN     TAKE these medications   albuterol 108 (90 Base) MCG/ACT inhaler Commonly known as: VENTOLIN HFA Inhale 2 puffs into the lungs every 6 (six) hours as needed for wheezing or shortness of breath.   ferrous sulfate 325 (65 FE) MG tablet Take 325 mg by mouth daily with breakfast.   furosemide 20 MG tablet Commonly known as: LASIX Take 1 tablet by mouth once daily What changed: how much to take   Gerhardt's butt cream Crea Apply 1 application topically 3 (three) times daily.   hydrocerin Crea Apply 1 application topically daily. Start taking on: October 24, 2019   insulin glargine 100 UNIT/ML Solostar Pen Commonly known as: LANTUS Inject 10 Units into the skin at bedtime.   ketoconazole 2 % cream Commonly known as: NIZORAL Apply 1 application topically daily as needed for irritation.   lovastatin 20 MG tablet Commonly known as: MEVACOR Take 1 tablet (20 mg total) by mouth every evening.   metFORMIN 1000 MG tablet Commonly known as: GLUCOPHAGE Take 1 tablet by mouth twice daily What changed:   how much to take  when to take this   metoprolol tartrate 50 MG tablet Commonly known as: LOPRESSOR Take 1 tablet (50 mg total) by mouth 2 (two) times daily. What changed:   medication strength  how much to take  when to take this   multivitamin with minerals Tabs tablet Take 1 tablet by mouth daily. Start taking on: October 24, 2019   pantoprazole 40 MG tablet Commonly known as: Protonix Take 1 tablet  (40 mg total) by mouth 2 (two) times daily.   simethicone 80 MG chewable tablet Commonly known as: MYLICON Chew 1 tablet (80 mg total) by mouth every 6 (six) hours as needed for flatulence (or gas pains).   traMADol-acetaminophen 37.5-325 MG tablet Commonly known as: ULTRACET Take 1 tablet by mouth every 6 (six) hours as needed for severe pain.   traZODone  50 MG tablet Commonly known as: DESYREL Take 1 tablet (50 mg total) by mouth at bedtime as needed for sleep.   vancomycin 125 MG capsule Commonly known as: VANCOCIN Take 1 capsule (125 mg total) by mouth 4 (four) times daily for 5 days.            Durable Medical Equipment  (From admission, onward)         Start     Ordered   10/23/19 1236  For home use only DME lightweight manual wheelchair with seat cushion  Once       Comments: Patient suffers from profound generalized weakness secondary to prolonged hospital admission which impairs their ability to perform daily activities like bathing, dressing, and toileting in the home.  A cane, crutch, or walker will not resolve issue with performing activities of daily living. A wheelchair will allow patient to safely perform daily activities. Patient is not able to propel themselves in the home using a standard weight wheelchair due to endurance and general weakness. Patient can self propel in the lightweight wheelchair. Length of need 6 months . Accessories: elevating leg rests (ELRs), wheel locks, extensions and anti-tippers.   10/23/19 1236   10/23/19 1232  For home use only DME 3 n 1  Once        10/23/19 1232           Discharge Care Instructions  (From admission, onward)         Start     Ordered   10/23/19 0000  Discharge wound care:       Comments: As above   10/23/19 1212          Follow-up Information    [facility] Follow up on 10/25/2019.   Specialty: Cardiology Why: at [facility] through the [place]  entrance Contact information: [place]             Allergies  Allergen Reactions  . Clindamycin/Lincomycin     Chest discomfort  . Sulfa Antibiotics Hives  . Sulfasalazine Hives    Consultations:  Cardiology  Gastroenterology    Procedures/Studies: CT ABDOMEN PELVIS WO CONTRAST  Result Date: 10/22/2019 CLINICAL DATA:  Infectious gastroenteritis or colitis persistent abdominal pain, distention EXAM: CT ABDOMEN AND PELVIS WITHOUT CONTRAST TECHNIQUE: Multidetector CT imaging of the abdomen and pelvis was performed following the standard protocol without IV contrast. COMPARISON:  Contrast-enhanced CT 5 days ago FINDINGS: Lower chest: Small left pleural effusion is similar to prior exam. Left greater than right basilar atelectasis. Cardiomegaly with pacemaker partially included. Hepatobiliary: Cirrhotic hepatic morphology with nodular contours. No focal abnormality on noncontrast exam. Unremarkable gallbladder. Pancreas: No ductal dilatation or evidence of acute inflammation. Spleen: Upper normal in size spanning 12.5 cm cranial caudal. Adrenals/Urinary Tract: Normal adrenal glands. Bilateral renal parenchymal thinning. No hydronephrosis. Calcification in the lower left kidney may be nonobstructing stone or vascular. Urinary bladder partially distended. There is mild bladder wall thickening. Stomach/Bowel: Bowel evaluation is limited in the absence of enteric contrast and presence of intra-abdominal ascites. No evidence of gastric wall thickening for degree of distension. Probable surgical clips in the duodenum. There is no small bowel obstruction or obvious wall thickening. Appendix surgically absent per history. Improved wall thickening of the descending and sigmoid colon over the past 5 days. No new or progressive colonic wall thickening. Small volume of formed stool. Vascular/Lymphatic: Aorto bi-iliac atherosclerosis. No  aortic aneurysm. Re- cannulated umbilical vein is better demonstrated on prior contrast-enhanced exam. No bulky abdominopelvic adenopathy. Reproductive: Prostate is unremarkable. Other: Small to moderate ascites, unchanged from prior exam. No free air. No evidence of focal abscess. Subcutaneous body wall edema, left greater than right, with slight increase. Fat in the inguinal canals. Small umbilical hernia containing fat and recannulated umbilical vein. Musculoskeletal: Stable. No acute findings. Degenerative change in the spine. IMPRESSION: 1. Improved wall thickening of the descending and sigmoid colon over the past 5 days. No new or progressive colonic wall thickening. 2. Cirrhosis with small to moderate ascites. Sequela of portal hypertension better demonstrated on prior contrast-enhanced exam. Increased body wall edema since prior. 3. Small left pleural effusion is similar to prior exam. Aortic Atherosclerosis (ICD10-I70.0). Electronically Signed   By: Keith Rake M.D.   On: 10/22/2019 17:32   DG Chest 1 View  Result Date: 10/12/2019 CLINICAL DATA:  Shortness of breath. EXAM: CHEST  1 VIEW COMPARISON:  October 08, 2019 FINDINGS: There is a dual lead AICD. Mild diffusely increased interstitial lung markings are seen the which is predominant stable in severity when compared to the prior exam. Mild to moderate severity prominence of the perihilar pulmonary vasculature is noted which represents a new finding. There is no evidence of a pleural effusion or pneumothorax. Stable moderate to marked severity enlargement of the cardiac silhouette is seen. A radiopaque fixation plate and multiple fixation screws are seen within the proximal left humerus. IMPRESSION: 1. Stable cardiomegaly with mild to moderate severity pulmonary vascular congestion. Electronically Signed   By: Virgina Norfolk M.D.   On: 10/12/2019 02:13   DG Chest 2 View  Result Date: 10/08/2019 CLINICAL DATA:  Shortness of breath, open  wounds to the posterior left lower leg and anterior right lower leg, history of asthma EXAM: CHEST - 2 VIEW COMPARISON:  Radiograph 08/08/2019, CT 09/15/2015 FINDINGS: Diffuse hazy basilar predominant interstitial opacities with some fissural thickening as well. More patchy retrocardiac infrahilar opacities in the lung bases. No pneumothorax or visible effusion. Prominent cardiac silhouette compatible with cardiomegaly seen on comparison studies. Pacer pack overlies the left chest wall with leads at the cardiac apex and right atrium in similar positioning to prior. Remaining cardiomediastinal contours are unremarkable. No acute osseous or soft tissue abnormality. Degenerative changes are present in the imaged spine and shoulders. Prior left humeral ORIF is noted. IMPRESSION: 1. Features of CHF/volume overload with cardiomegaly and interstitial opacities suggesting edema. 2. More patchy retrocardiac and infrahilar opacities in the lung bases may reflect developing alveolar edema, atelectasis or airspace disease. Electronically Signed   By: Lovena Le M.D.   On: 10/08/2019 17:36   DG Tibia/Fibula Left  Result Date: 10/08/2019 CLINICAL DATA:  Open wound to posterior left lower leg. EXAM: LEFT TIBIA AND FIBULA - 2 VIEW COMPARISON:  None. FINDINGS: There is no evidence of fracture or other focal bone lesions. Mild diffuse soft tissue swelling is seen throughout the left calf with moderate severity soft tissue swelling also noted along the anterior aspect of the left tibia. No soft tissue air is seen. IMPRESSION: 1. Soft tissue swelling which may be secondary to diffuse cellulitis, without evidence of an acute osseous abnormality. Electronically Signed   By: Virgina Norfolk M.D.   On: 10/08/2019 19:49   CT ABDOMEN PELVIS W CONTRAST  Result Date: 10/17/2019 CLINICAL DATA:  Upper abdominal pain. EXAM: CT ABDOMEN AND PELVIS WITH CONTRAST TECHNIQUE: Multidetector CT imaging of the abdomen and pelvis was performed  using the standard protocol following bolus administration of intravenous contrast. CONTRAST:  <See Chart> OMNIPAQUE IOHEXOL 300 MG/ML SOLN, 66m OMNIPAQUE IOHEXOL 350 MG/ML SOLN COMPARISON:  August 04, 2019 FINDINGS: Lower chest: There is a small left-sided pleural effusion. There is bibasilar atelectasis.The heart is enlarged. Hepatobiliary: The liver is cirrhotic. Normal gallbladder.There is no biliary ductal dilation. Pancreas: Normal contours without ductal dilatation. No peripancreatic fluid collection. Spleen: Unremarkable. Adrenals/Urinary Tract: --Adrenal glands: Unremarkable. --Right kidney/ureter: No hydronephrosis or radiopaque kidney stones. --Left kidney/ureter: No hydronephrosis or radiopaque kidney stones. --Urinary bladder: The bladder is decompressed by Foley catheter and therefore is poorly evaluated. Stomach/Bowel: --Stomach/Duodenum: There is a surgical clip within the proximal duodenum. --Small bowel: Unremarkable. --Colon: There is mild diffuse circumferential wall thickening of the sigmoid and descending colon. There is a metallic foreign body at the level of the proximal descending colon which may be related to recent intervention. --Appendix: Surgically absent. Vascular/Lymphatic: Atherosclerotic calcification is present within the non-aneurysmal abdominal aorta, without hemodynamically significant stenosis. There is evidence for portal hypertension with recanalization of the umbilical vein in addition to extensive portosystemic shunting. The portal vein appears patent. The SMV is patent. The splenic vein remains patent. There may be some small esophageal varices. --No retroperitoneal lymphadenopathy. --No mesenteric lymphadenopathy. --No pelvic or inguinal lymphadenopathy. Reproductive: Unremarkable Other: There is a small volume of abdominal ascites. The abdominal wall is normal. Musculoskeletal. No acute displaced fractures. IMPRESSION: 1. Mild diffuse circumferential wall thickening of the  sigmoid and descending colon may be secondary to underdistention or mild colitis. 2. Cirrhosis with stigmata of portal hypertension including recanalization of the umbilical vein and extensive portosystemic shunting. 3. Small volume abdominal ascites. 4. Small left-sided pleural effusion with bibasilar atelectasis. 5. Cardiomegaly. Aortic Atherosclerosis (ICD10-I70.0). Electronically Signed   By: CConstance HolsterM.D.   On: 10/17/2019 16:41   DG Chest Port 1 View  Result Date: 10/16/2019 CLINICAL DATA:  Hypertension, CHF EXAM: PORTABLE CHEST 1 VIEW COMPARISON:  10/12/2019 FINDINGS: Mild bilateral interstitial thickening. No pleural effusion or pneumothorax. No focal consolidation. Stable cardiomegaly. Dual lead cardiac pacemaker. No acute osseous abnormality. IMPRESSION: Cardiomegaly with mild pulmonary vascular congestion. Electronically Signed   By: HKathreen Devoid  On: 10/16/2019 15:37       Subjective: Patient seen this AM.  He had another rough night, significant delays in getting him cleaned up due to shortage of staffing.  Says he cannot emotionally tolerate another night here.  Feels gassy but diarrhea improving, much less frequent.  Has some abdominal pain that was improved with simethicone.  Reassured by negative CT scan yseterday.   Discharge Exam: Vitals:   10/23/19 0729 10/23/19 1140  BP: 128/63 105/63  Pulse: 90 88  Resp: 18 18  Temp: 98.1 F (36.7 C) 98.4 F (36.9 C)  SpO2: 97% 95%   Vitals:   10/22/19 2025 10/23/19 0530 10/23/19 0729 10/23/19 1140  BP: 105/72 129/65 128/63 105/63  Pulse: (!) 110 96 90 88  Resp: 18 20 18 18   Temp: 98.9 F (37.2 C) 98.8 F (37.1 C) 98.1 F (36.7 C) 98.4 F (36.9 C)  TempSrc: Oral Oral Oral Oral  SpO2: 96% 95% 97% 95%  Weight:  128.2 kg    Height:        General: Pt is alert, awake, not in acute distress, obese Cardiovascular: RRR, S1/S2 +, no rubs, no gallops Respiratory: CTA bilaterally, no wheezing, no rhonchi Abdominal:  Soft, NT, ND, bowel sounds + Extremities: Trace edema, scattered ring lesions on  extremities are stable, no cyanosis    The results of significant diagnostics from this hospitalization (including imaging, microbiology, ancillary and laboratory) are listed below for reference.     Microbiology: Recent Results (from the past 240 hour(s))  CULTURE, BLOOD (ROUTINE X 2) w Reflex to ID Panel     Status: None   Collection Time: 10/16/19 12:21 PM   Specimen: BLOOD  Result Value Ref Range Status   Specimen Description BLOOD [facility]  Final   Special Requests   Final    BOTTLES DRAWN AEROBIC AND ANAEROBIC Blood Culture adequate volume   Culture   Final    NO GROWTH 5 DAYS Performed at [facility], [address] [place], [place] [address]    Report Status 10/21/2019 FINAL  Final  CULTURE, BLOOD (ROUTINE X 2) w Reflex to ID Panel     Status: None   Collection Time: 10/16/19 12:32 PM   Specimen: BLOOD  Result Value Ref Range Status   Specimen Description BLOOD [facility]  Final   Special Requests   Final    BOTTLES DRAWN AEROBIC ONLY Blood Culture adequate volume   Culture   Final    NO GROWTH 5 DAYS Performed at [facility], [address], [place], [place] [address]    Report Status 10/21/2019 FINAL  Final  Urine Culture     Status: None   Collection Time: 10/16/19  3:34 PM   Specimen: Urine, Random  Result Value Ref Range Status   Specimen Description   Final    URINE, RANDOM Performed at [facility], [address], [place], [place] [address]    Special Requests   Final    NONE Performed at [facility], [address], [place], [place] [address]    Culture   Final    NO GROWTH Performed at [facility], [place] [address], [place], [place] [address]    Report Status 10/17/2019 FINAL  Final  C Difficile Quick Screen (NO PCR Reflex)     Status: Abnormal   Collection Time: 10/17/19  8:15 PM   Specimen: STOOL  Result Value Ref Range Status    C Diff antigen POSITIVE (A) NEGATIVE Final   C Diff toxin POSITIVE (A) NEGATIVE Final   C Diff interpretation Toxin producing C. difficile detected.  Final    Comment: CRITICAL RESULT CALLED TO, READ BACK BY AND VERIFIED WITH: DENISE [facility] 10/17/19 AT 2127 BY ACR Performed at [facility], [address] [place], [place] [address]      Labs: BNP (last 3 results) Recent Labs    10/08/19 1706 10/16/19 1843 10/17/19 0614  BNP 399.1* 451.9* 811.5*   Basic Metabolic Panel: Recent Labs  Lab 10/19/19 0701 10/20/19 0527 10/21/19 0600 10/22/19 0608 10/23/19 0609  NA 137 133* 135 135 134*  K 3.8 4.1 4.6 4.3 4.1  CL 97* 96* 100 100 100  CO2 30 30 27 28 27   GLUCOSE 113* 158* 148* 152* 151*  BUN 25* 25* 19 16 16   CREATININE 1.36* 1.62* 1.37* 1.41* 1.44*  CALCIUM 8.0* 8.2* 8.1* 8.1* 8.0*  MG 2.2 2.4 2.2 2.2 2.1   Liver Function Tests: Recent Labs  Lab 10/16/19 1842 10/17/19 0614 10/18/19 0628  AST 41 34 35  ALT 16 15 16   ALKPHOS 97 85 90  BILITOT 1.2 1.4*  1.5* 1.4*  PROT 6.9 6.4* 6.2*  ALBUMIN 2.3* 2.2* 2.1*   Recent Labs  Lab 10/17/19 0614  LIPASE 38  No results for input(s): AMMONIA in the last 168 hours. CBC: Recent Labs  Lab 10/19/19 0701 10/20/19 0527 10/21/19 0600 10/22/19 0608 10/23/19 0609  WBC 11.5* 7.8 7.3 7.3 6.9  HGB 8.7* 8.0* 8.2* 8.8* 8.5*  HCT 26.2* 25.3* 26.3* 28.1* 27.5*  MCV 94.6 97.7 98.9 99.3 98.9  PLT 277 255 262 286 250   Cardiac Enzymes: No results for input(s): CKTOTAL, CKMB, CKMBINDEX, TROPONINI in the last 168 hours. BNP: Invalid input(s): POCBNP CBG: Recent Labs  Lab 10/22/19 1244 10/22/19 1743 10/22/19 2119 10/23/19 0728 10/23/19 1142  GLUCAP 219* 196* 176* 123* 136*   D-Dimer No results for input(s): DDIMER in the last 72 hours. Hgb A1c No results for input(s): HGBA1C in the last 72 hours. Lipid Profile No results for input(s): CHOL, HDL, LDLCALC, TRIG, CHOLHDL, LDLDIRECT in the last 72  hours. Thyroid function studies No results for input(s): TSH, T4TOTAL, T3FREE, THYROIDAB in the last 72 hours.  Invalid input(s): FREET3 Anemia work up No results for input(s): VITAMINB12, FOLATE, FERRITIN, TIBC, IRON, RETICCTPCT in the last 72 hours. Urinalysis    Component Value Date/Time   COLORURINE YELLOW (A) 10/16/2019 1534   APPEARANCEUR CLEAR (A) 10/16/2019 1534   APPEARANCEUR Cloudy (A) 08/20/2017 1309   LABSPEC 1.009 10/16/2019 1534   LABSPEC 1.041 09/14/2013 1658   PHURINE 6.0 10/16/2019 1534   GLUCOSEU NEGATIVE 10/16/2019 1534   GLUCOSEU 50 mg/dL 09/14/2013 1658   HGBUR MODERATE (A) 10/16/2019 1534   BILIRUBINUR NEGATIVE 10/16/2019 1534   BILIRUBINUR negative 04/08/2019 1205   BILIRUBINUR Negative 08/20/2017 1309   BILIRUBINUR Negative 09/14/2013 1658   KETONESUR NEGATIVE 10/16/2019 1534   PROTEINUR 30 (A) 10/16/2019 1534   UROBILINOGEN 0.2 04/08/2019 1205   NITRITE NEGATIVE 10/16/2019 1534   LEUKOCYTESUR NEGATIVE 10/16/2019 1534   LEUKOCYTESUR Negative 09/14/2013 1658   Sepsis Labs Invalid input(s): PROCALCITONIN,  WBC,  LACTICIDVEN Microbiology Recent Results (from the past 240 hour(s))  CULTURE, BLOOD (ROUTINE X 2) w Reflex to ID Panel     Status: None   Collection Time: 10/16/19 12:21 PM   Specimen: BLOOD  Result Value Ref Range Status   Specimen Description BLOOD [facility]  Final   Special Requests   Final    BOTTLES DRAWN AEROBIC AND ANAEROBIC Blood Culture adequate volume   Culture   Final    NO GROWTH 5 DAYS Performed at [facility], [address] [place], [place] [address]    Report Status 10/21/2019 FINAL  Final  CULTURE, BLOOD (ROUTINE X 2) w Reflex to ID Panel     Status: None   Collection Time: 10/16/19 12:32 PM   Specimen: BLOOD  Result Value Ref Range Status   Specimen Description BLOOD [facility]  Final   Special Requests   Final    BOTTLES DRAWN AEROBIC ONLY Blood Culture adequate volume   Culture   Final    NO GROWTH 5  DAYS Performed at [facility], [address], [place], [place] [address]    Report Status 10/21/2019 FINAL  Final  Urine Culture     Status: None   Collection Time: 10/16/19  3:34 PM   Specimen: Urine, Random  Result Value Ref Range Status   Specimen Description   Final    URINE, RANDOM Performed at [facility], [address] [place], [place] [address]    Special Requests   Final    NONE Performed at [facility], [address] [place],  [address]    Culture  Final    NO GROWTH Performed at [facility], [place] [address], [place], [place] [address]    Report Status 10/17/2019 FINAL  Final  C Difficile Quick Screen (NO PCR Reflex)     Status: Abnormal   Collection Time: 10/17/19  8:15 PM   Specimen: STOOL  Result Value Ref Range Status   C Diff antigen POSITIVE (A) NEGATIVE Final   C Diff toxin POSITIVE (A) NEGATIVE Final   C Diff interpretation Toxin producing C. difficile detected.  Final    Comment: CRITICAL RESULT CALLED TO, READ BACK BY AND VERIFIED WITH: DENISE [facility] 10/17/19 AT 2127 BY ACR Performed at [facility], [address] [place], [place] [address]      Time coordinating discharge: Over 30 minutes  SIGNED:   Ezekiel Slocumb, DO [facility] 10/23/2019, 1:14 PM   If 7PM-7AM, please contact night-coverage [email or web address]

## 2019-10-23 NOTE — Progress Notes (Signed)
Ch visited with Pt and Pt's wife [name] in response to page about AD completion. AD packet had already been provided to the room. Ch gave AD education. Doris says that Pt is going to get discharged today, so may not be bale to complete document and get it notarized. [place] asked if they would like prayer, and they agreed. Ch prayed with them.

## 2019-10-23 NOTE — Progress Notes (Signed)
SUBJECTIVE: Patient is breathing better   Vitals:   10/22/19 1606 10/22/19 2025 10/23/19 0530 10/23/19 0729  BP: 120/76 105/72 129/65 128/63  Pulse: 80 (!) 110 96 90  Resp: 19 18 20 18   Temp: 97.9 F (36.6 C) 98.9 F (37.2 C) 98.8 F (37.1 C) 98.1 F (36.7 C)  TempSrc:  Oral Oral Oral  SpO2: 99% 96% 95% 97%  Weight:   128.2 kg   Height:        Intake/Output Summary (Last 24 hours) at 10/23/2019 1022 Last data filed at 10/23/2019 0729 Gross per 24 hour  Intake --  Output 4 ml  Net -4 ml    LABS: Basic Metabolic Panel: Recent Labs    10/22/19 0608 10/23/19 0609  NA 135 134*  K 4.3 4.1  CL 100 100  CO2 28 27  GLUCOSE 152* 151*  BUN 16 16  CREATININE 1.41* 1.44*  CALCIUM 8.1* 8.0*  MG 2.2 2.1   Liver Function Tests: No results for input(s): AST, ALT, ALKPHOS, BILITOT, PROT, ALBUMIN in the last 72 hours. No results for input(s): LIPASE, AMYLASE in the last 72 hours. CBC: Recent Labs    10/22/19 0608 10/23/19 0609  WBC 7.3 6.9  HGB 8.8* 8.5*  HCT 28.1* 27.5*  MCV 99.3 98.9  PLT 286 250   Cardiac Enzymes: No results for input(s): CKTOTAL, CKMB, CKMBINDEX, TROPONINI in the last 72 hours. BNP: Invalid input(s): POCBNP D-Dimer: No results for input(s): DDIMER in the last 72 hours. Hemoglobin A1C: No results for input(s): HGBA1C in the last 72 hours. Fasting Lipid Panel: No results for input(s): CHOL, HDL, LDLCALC, TRIG, CHOLHDL, LDLDIRECT in the last 72 hours. Thyroid Function Tests: No results for input(s): TSH, T4TOTAL, T3FREE, THYROIDAB in the last 72 hours.  Invalid input(s): FREET3 Anemia Panel: No results for input(s): VITAMINB12, FOLATE, FERRITIN, TIBC, IRON, RETICCTPCT in the last 72 hours.   PHYSICAL EXAM General: Well developed, well nourished, in no acute distress HEENT:  Normocephalic and atramatic Neck:  No JVD.  Lungs: Clear bilaterally to auscultation and percussion. Heart: HRRR . Normal S1 and S2 without gallops or murmurs.   Abdomen: Bowel sounds are positive, abdomen soft and non-tender  Msk:  Back normal, normal gait. Normal strength and tone for age. Extremities: No clubbing, cyanosis or edema.   Neuro: Alert and oriented X 3. Psych:  Good affect, responds appropriately  TELEMETRY: Atrial fibrillation  ASSESSMENT AND PLAN: Heart failure with preserved ejection fraction/chronic atrial fibrillation and C. difficile colitis.  Patient is gradually getting better with no new complaints.  Principal Problem:   (HFpEF) heart failure with preserved ejection fraction (HCC) Active Problems:   Diabetes mellitus (HCC)   Benign essential HTN   Benign prostatic hyperplasia with urinary obstruction   Chronic atrial fibrillation (HCC)   Morbid (severe) obesity due to excess calories (HCC)   NASH (nonalcoholic steatohepatitis)   Bilateral carotid artery stenosis   CKD stage 3 due to type 2 diabetes mellitus (HCC)   Acute exacerbation of CHF (congestive heart failure) ([place])   Leg wound, left, initial encounter   C. difficile colitis    Christian Berg A, MD, [facility] 10/23/2019 10:22 AM

## 2019-10-24 ENCOUNTER — Telehealth: Payer: Self-pay | Admitting: *Deleted

## 2019-10-24 NOTE — Chronic Care Management (AMB) (Signed)
  Chronic Care Management   Note  10/24/2019 Name: Christian Berg MRN: [id number] DOB: 1950-02-13  Christian Berg is a 69 y.o. year old male who is a primary care patient of Fisher, Kirstie Peri, MD. I reached out to Jacqlyn Krauss by phone today in response to a referral sent by Christian Berg PCP, Birdie Sons, MD.     Christian Berg was given information about Chronic Care Management services today including:  1. CCM service includes personalized support from designated clinical staff supervised by his physician, including individualized plan of care and coordination with other care providers 2. 24/7 contact phone numbers for assistance for urgent and routine care needs. 3. Service will only be billed when office clinical staff spend 20 minutes or more in a month to coordinate care. 4. Only one practitioner may furnish and bill the service in a calendar month. 5. The patient may stop CCM services at any time (effective at the end of the month) by phone call to the office staff. 6. The patient will be responsible for cost sharing (co-pay) of up to 20% of the service fee (after annual deductible is met).  Patient did not agree to enrollment in care management services and does not wish to consider at this time.  Follow up plan: Patient declines CCM services and engagement by the care management team. Appropriate care team members and provider have been notified via electronic communication. The care management team is available to follow up with the patient after provider conversation with the patient regarding recommendation for care management engagement and subsequent re-referral to the care management team.   [facility]

## 2019-10-25 ENCOUNTER — Ambulatory Visit: Payer: Medicare HMO | Admitting: Family

## 2019-10-28 ENCOUNTER — Emergency Department: Payer: Medicare HMO

## 2019-10-28 ENCOUNTER — Encounter: Payer: Self-pay | Admitting: Emergency Medicine

## 2019-10-28 ENCOUNTER — Other Ambulatory Visit: Payer: Self-pay

## 2019-10-28 ENCOUNTER — Inpatient Hospital Stay
Admission: EM | Admit: 2019-10-28 | Discharge: 2019-10-31 | DRG: 682 | Disposition: A | Discharge status: Home Health | Payer: Medicare HMO | Attending: Internal Medicine | Admitting: Internal Medicine

## 2019-10-28 DIAGNOSIS — E1151 Type 2 diabetes mellitus with diabetic peripheral angiopathy without gangrene: Secondary | ICD-10-CM | POA: Diagnosis present

## 2019-10-28 DIAGNOSIS — I5032 Chronic diastolic (congestive) heart failure: Secondary | ICD-10-CM | POA: Diagnosis present

## 2019-10-28 DIAGNOSIS — Z8673 Personal history of transient ischemic attack (TIA), and cerebral infarction without residual deficits: Secondary | ICD-10-CM

## 2019-10-28 DIAGNOSIS — E119 Type 2 diabetes mellitus without complications: Secondary | ICD-10-CM

## 2019-10-28 DIAGNOSIS — A0472 Enterocolitis due to Clostridium difficile, not specified as recurrent: Secondary | ICD-10-CM | POA: Diagnosis not present

## 2019-10-28 DIAGNOSIS — R4182 Altered mental status, unspecified: Secondary | ICD-10-CM | POA: Diagnosis not present

## 2019-10-28 DIAGNOSIS — Z7982 Long term (current) use of aspirin: Secondary | ICD-10-CM

## 2019-10-28 DIAGNOSIS — N400 Enlarged prostate without lower urinary tract symptoms: Secondary | ICD-10-CM | POA: Diagnosis present

## 2019-10-28 DIAGNOSIS — Z87442 Personal history of urinary calculi: Secondary | ICD-10-CM

## 2019-10-28 DIAGNOSIS — Z8249 Family history of ischemic heart disease and other diseases of the circulatory system: Secondary | ICD-10-CM

## 2019-10-28 DIAGNOSIS — K269 Duodenal ulcer, unspecified as acute or chronic, without hemorrhage or perforation: Secondary | ICD-10-CM | POA: Diagnosis present

## 2019-10-28 DIAGNOSIS — I4819 Other persistent atrial fibrillation: Secondary | ICD-10-CM | POA: Diagnosis present

## 2019-10-28 DIAGNOSIS — K760 Fatty (change of) liver, not elsewhere classified: Secondary | ICD-10-CM | POA: Diagnosis present

## 2019-10-28 DIAGNOSIS — Z882 Allergy status to sulfonamides status: Secondary | ICD-10-CM

## 2019-10-28 DIAGNOSIS — J45909 Unspecified asthma, uncomplicated: Secondary | ICD-10-CM | POA: Diagnosis present

## 2019-10-28 DIAGNOSIS — Z6841 Body Mass Index (BMI) 40.0 and over, adult: Secondary | ICD-10-CM | POA: Diagnosis not present

## 2019-10-28 DIAGNOSIS — R0989 Other specified symptoms and signs involving the circulatory and respiratory systems: Secondary | ICD-10-CM | POA: Diagnosis not present

## 2019-10-28 DIAGNOSIS — E669 Obesity, unspecified: Secondary | ICD-10-CM | POA: Diagnosis present

## 2019-10-28 DIAGNOSIS — I5030 Unspecified diastolic (congestive) heart failure: Secondary | ICD-10-CM | POA: Diagnosis present

## 2019-10-28 DIAGNOSIS — Z950 Presence of cardiac pacemaker: Secondary | ICD-10-CM

## 2019-10-28 DIAGNOSIS — I482 Chronic atrial fibrillation, unspecified: Secondary | ICD-10-CM

## 2019-10-28 DIAGNOSIS — E11649 Type 2 diabetes mellitus with hypoglycemia without coma: Secondary | ICD-10-CM | POA: Diagnosis not present

## 2019-10-28 DIAGNOSIS — R600 Localized edema: Secondary | ICD-10-CM | POA: Diagnosis not present

## 2019-10-28 DIAGNOSIS — Z961 Presence of intraocular lens: Secondary | ICD-10-CM | POA: Diagnosis present

## 2019-10-28 DIAGNOSIS — E8770 Fluid overload, unspecified: Secondary | ICD-10-CM | POA: Diagnosis present

## 2019-10-28 DIAGNOSIS — E538 Deficiency of other specified B group vitamins: Secondary | ICD-10-CM | POA: Diagnosis present

## 2019-10-28 DIAGNOSIS — E7800 Pure hypercholesterolemia, unspecified: Secondary | ICD-10-CM | POA: Diagnosis present

## 2019-10-28 DIAGNOSIS — Z8619 Personal history of other infectious and parasitic diseases: Secondary | ICD-10-CM

## 2019-10-28 DIAGNOSIS — Z794 Long term (current) use of insulin: Secondary | ICD-10-CM

## 2019-10-28 DIAGNOSIS — M2550 Pain in unspecified joint: Secondary | ICD-10-CM | POA: Diagnosis not present

## 2019-10-28 DIAGNOSIS — G9341 Metabolic encephalopathy: Secondary | ICD-10-CM | POA: Diagnosis present

## 2019-10-28 DIAGNOSIS — R404 Transient alteration of awareness: Secondary | ICD-10-CM | POA: Diagnosis not present

## 2019-10-28 DIAGNOSIS — G319 Degenerative disease of nervous system, unspecified: Secondary | ICD-10-CM | POA: Diagnosis present

## 2019-10-28 DIAGNOSIS — Z79899 Other long term (current) drug therapy: Secondary | ICD-10-CM

## 2019-10-28 DIAGNOSIS — K7460 Unspecified cirrhosis of liver: Secondary | ICD-10-CM | POA: Diagnosis present

## 2019-10-28 DIAGNOSIS — Z7901 Long term (current) use of anticoagulants: Secondary | ICD-10-CM

## 2019-10-28 DIAGNOSIS — R52 Pain, unspecified: Secondary | ICD-10-CM | POA: Diagnosis not present

## 2019-10-28 DIAGNOSIS — E871 Hypo-osmolality and hyponatremia: Secondary | ICD-10-CM | POA: Diagnosis present

## 2019-10-28 DIAGNOSIS — I130 Hypertensive heart and chronic kidney disease with heart failure and stage 1 through stage 4 chronic kidney disease, or unspecified chronic kidney disease: Secondary | ICD-10-CM | POA: Diagnosis present

## 2019-10-28 DIAGNOSIS — N1832 Chronic kidney disease, stage 3b: Secondary | ICD-10-CM | POA: Diagnosis not present

## 2019-10-28 DIAGNOSIS — Z20822 Contact with and (suspected) exposure to covid-19: Secondary | ICD-10-CM | POA: Diagnosis present

## 2019-10-28 DIAGNOSIS — N1831 Chronic kidney disease, stage 3a: Secondary | ICD-10-CM | POA: Diagnosis present

## 2019-10-28 DIAGNOSIS — E1122 Type 2 diabetes mellitus with diabetic chronic kidney disease: Secondary | ICD-10-CM | POA: Diagnosis present

## 2019-10-28 DIAGNOSIS — N179 Acute kidney failure, unspecified: Secondary | ICD-10-CM | POA: Diagnosis not present

## 2019-10-28 DIAGNOSIS — Z881 Allergy status to other antibiotic agents status: Secondary | ICD-10-CM

## 2019-10-28 DIAGNOSIS — L565 Disseminated superficial actinic porokeratosis (DSAP): Secondary | ICD-10-CM | POA: Diagnosis present

## 2019-10-28 DIAGNOSIS — R531 Weakness: Secondary | ICD-10-CM | POA: Diagnosis not present

## 2019-10-28 DIAGNOSIS — Z9842 Cataract extraction status, left eye: Secondary | ICD-10-CM

## 2019-10-28 DIAGNOSIS — E8809 Other disorders of plasma-protein metabolism, not elsewhere classified: Secondary | ICD-10-CM | POA: Diagnosis not present

## 2019-10-28 DIAGNOSIS — Z9841 Cataract extraction status, right eye: Secondary | ICD-10-CM

## 2019-10-28 DIAGNOSIS — R0902 Hypoxemia: Secondary | ICD-10-CM | POA: Diagnosis not present

## 2019-10-28 DIAGNOSIS — I959 Hypotension, unspecified: Secondary | ICD-10-CM | POA: Diagnosis not present

## 2019-10-28 DIAGNOSIS — M5136 Other intervertebral disc degeneration, lumbar region: Secondary | ICD-10-CM | POA: Diagnosis present

## 2019-10-28 DIAGNOSIS — Z7401 Bed confinement status: Secondary | ICD-10-CM | POA: Diagnosis not present

## 2019-10-28 LAB — BLOOD GAS, ARTERIAL
Acid-Base Excess: 2.4 mmol/L — ABNORMAL HIGH (ref 0.0–2.0)
Bicarbonate: 27.8 mmol/L (ref 20.0–28.0)
FIO2: 0.21
O2 Saturation: 49.6 %
Patient temperature: 37
pCO2 arterial: 46 mmHg (ref 32.0–48.0)
pH, Arterial: 7.39 (ref 7.350–7.450)
pO2, Arterial: <31 mmHg — CL (ref 83.0–108.0)

## 2019-10-28 LAB — CBC WITH DIFFERENTIAL/PLATELET
Abs Immature Granulocytes: 0.03 10*3/uL (ref 0.00–0.07)
Basophils Absolute: 0.1 10*3/uL (ref 0.0–0.1)
Basophils Relative: 1 %
Eosinophils Absolute: 0.2 10*3/uL (ref 0.0–0.5)
Eosinophils Relative: 3 %
HCT: 29.9 % — ABNORMAL LOW (ref 39.0–52.0)
Hemoglobin: 9.5 g/dL — ABNORMAL LOW (ref 13.0–17.0)
Immature Granulocytes: 0 %
Lymphocytes Relative: 12 %
Lymphs Abs: 0.9 10*3/uL (ref 0.7–4.0)
MCH: 30.7 pg (ref 26.0–34.0)
MCHC: 31.8 g/dL (ref 30.0–36.0)
MCV: 96.8 fL (ref 80.0–100.0)
Monocytes Absolute: 0.7 10*3/uL (ref 0.1–1.0)
Monocytes Relative: 9 %
Neutro Abs: 5.7 10*3/uL (ref 1.7–7.7)
Neutrophils Relative %: 75 %
Platelets: 232 10*3/uL (ref 150–400)
RBC: 3.09 MIL/uL — ABNORMAL LOW (ref 4.22–5.81)
RDW: 19.9 % — ABNORMAL HIGH (ref 11.5–15.5)
WBC: 7.7 10*3/uL (ref 4.0–10.5)
nRBC: 0 % (ref 0.0–0.2)

## 2019-10-28 LAB — LACTIC ACID, PLASMA
Lactic Acid, Venous: 1.9 mmol/L (ref 0.5–1.9)
Lactic Acid, Venous: 1.8 mmol/L (ref 0.5–1.9)

## 2019-10-28 LAB — URINALYSIS, COMPLETE (UACMP) WITH MICROSCOPIC
Bacteria, UA: NONE SEEN
Bilirubin Urine: NEGATIVE
Glucose, UA: NEGATIVE mg/dL
Hgb urine dipstick: NEGATIVE
Ketones, ur: NEGATIVE mg/dL
Leukocytes,Ua: NEGATIVE
Nitrite: NEGATIVE
Protein, ur: NEGATIVE mg/dL
Specific Gravity, Urine: 1.025 (ref 1.005–1.030)
Squamous Epithelial / HPF: NONE SEEN (ref 0–5)
pH: 5 (ref 5.0–8.0)

## 2019-10-28 LAB — COMPREHENSIVE METABOLIC PANEL
ALT: 19 U/L (ref 0–44)
AST: 40 U/L (ref 15–41)
Albumin: 2.5 g/dL — ABNORMAL LOW (ref 3.5–5.0)
Alkaline Phosphatase: 136 U/L — ABNORMAL HIGH (ref 38–126)
Anion gap: 12 (ref 5–15)
BUN: 28 mg/dL — ABNORMAL HIGH (ref 8–23)
CO2: 24 mmol/L (ref 22–32)
Calcium: 8.3 mg/dL — ABNORMAL LOW (ref 8.9–10.3)
Chloride: 96 mmol/L — ABNORMAL LOW (ref 98–111)
Creatinine, Ser: 3.85 mg/dL — ABNORMAL HIGH (ref 0.61–1.24)
GFR calc Af Amer: 17 mL/min — ABNORMAL LOW (ref >60)
GFR calc non Af Amer: 15 mL/min — ABNORMAL LOW (ref >60)
Glucose, Bld: 119 mg/dL — ABNORMAL HIGH (ref 70–99)
Potassium: 4.5 mmol/L (ref 3.5–5.1)
Sodium: 132 mmol/L — ABNORMAL LOW (ref 135–145)
Total Bilirubin: 1.4 mg/dL — ABNORMAL HIGH (ref 0.3–1.2)
Total Protein: 7.1 g/dL (ref 6.5–8.1)

## 2019-10-28 LAB — GLUCOSE, CAPILLARY
Glucose-Capillary: 102 mg/dL — ABNORMAL HIGH (ref 70–99)
Glucose-Capillary: 83 mg/dL (ref 70–99)
Glucose-Capillary: 84 mg/dL (ref 70–99)

## 2019-10-28 LAB — RESPIRATORY PANEL BY RT PCR (FLU A&B, COVID)
Influenza A by PCR: NEGATIVE
Influenza B by PCR: NEGATIVE
SARS Coronavirus 2 by RT PCR: NEGATIVE

## 2019-10-28 LAB — AMMONIA: Ammonia: 13 umol/L (ref 9–35)

## 2019-10-28 LAB — TROPONIN I (HIGH SENSITIVITY)
Troponin I (High Sensitivity): 140 ng/L (ref <18)
Troponin I (High Sensitivity): 136 ng/L (ref <18)

## 2019-10-28 LAB — PROTIME-INR
INR: 1.7 — ABNORMAL HIGH (ref 0.8–1.2)
Prothrombin Time: 19.1 seconds — ABNORMAL HIGH (ref 11.4–15.2)

## 2019-10-28 LAB — PROCALCITONIN: Procalcitonin: <0.1 ng/mL

## 2019-10-28 LAB — MAGNESIUM: Magnesium: 2.2 mg/dL (ref 1.7–2.4)

## 2019-10-28 LAB — BRAIN NATRIURETIC PEPTIDE: B Natriuretic Peptide: 439.3 pg/mL — ABNORMAL HIGH (ref 0.0–100.0)

## 2019-10-28 MED ORDER — SODIUM CHLORIDE 0.9% FLUSH
3.0000 mL | Freq: Two times a day (BID) | INTRAVENOUS | Status: DC
  Administered 2019-10-28 – 2019-10-31 (×6): 3 mL via INTRAVENOUS

## 2019-10-28 MED ORDER — SODIUM CHLORIDE 0.9% FLUSH: 3.0000 mL | INTRAVENOUS | Status: DC | PRN

## 2019-10-28 MED ORDER — ALBUTEROL SULFATE HFA 108 (90 BASE) MCG/ACT IN AERS
2.0000 | INHALATION_SPRAY | Freq: Four times a day (QID) | RESPIRATORY_TRACT | Status: DC | PRN
  Filled 2019-10-28: qty 6.7

## 2019-10-28 MED ORDER — ADULT MULTIVITAMIN W/MINERALS CH
1.0000 | ORAL_TABLET | Freq: Every day | ORAL | Status: DC
  Administered 2019-10-28 – 2019-10-31 (×4): 1 via ORAL
  Filled 2019-10-28 (×4): qty 1

## 2019-10-28 MED ORDER — VANCOMYCIN 50 MG/ML ORAL SOLUTION
125.0000 mg | Freq: Four times a day (QID) | ORAL | Status: DC
  Administered 2019-10-28 – 2019-10-29 (×7): 125 mg via ORAL
  Filled 2019-10-28 (×11): qty 2.5

## 2019-10-28 MED ORDER — PRAVASTATIN SODIUM 20 MG PO TABS
20.0000 mg | ORAL_TABLET | Freq: Every day | ORAL | Status: DC
  Administered 2019-10-28 – 2019-10-30 (×3): 20 mg via ORAL
  Filled 2019-10-28 (×4): qty 1

## 2019-10-28 MED ORDER — MIDODRINE HCL 5 MG PO TABS
10.0000 mg | ORAL_TABLET | Freq: Three times a day (TID) | ORAL | Status: DC
  Administered 2019-10-28 – 2019-10-31 (×9): 10 mg via ORAL
  Filled 2019-10-28 (×11): qty 2

## 2019-10-28 MED ORDER — ASPIRIN EC 81 MG PO TBEC
81.0000 mg | DELAYED_RELEASE_TABLET | Freq: Every evening | ORAL | Status: DC
  Administered 2019-10-28 – 2019-10-30 (×3): 81 mg via ORAL
  Filled 2019-10-28 (×3): qty 1

## 2019-10-28 MED ORDER — INSULIN GLARGINE 100 UNIT/ML ~~LOC~~ SOLN
10.0000 [IU] | Freq: Every day | SUBCUTANEOUS | Status: DC
  Administered 2019-10-28: 10 [IU] via SUBCUTANEOUS
  Filled 2019-10-28 (×2): qty 0.1

## 2019-10-28 MED ORDER — METOPROLOL TARTRATE 50 MG PO TABS
50.0000 mg | ORAL_TABLET | Freq: Two times a day (BID) | ORAL | Status: DC
  Administered 2019-10-28 – 2019-10-31 (×5): 50 mg via ORAL
  Filled 2019-10-28 (×5): qty 1

## 2019-10-28 MED ORDER — SODIUM CHLORIDE 0.9 % IV SOLN: INTRAVENOUS | Status: DC

## 2019-10-28 MED ORDER — INSULIN ASPART 100 UNIT/ML ~~LOC~~ SOLN
0.0000 [IU] | Freq: Every day | SUBCUTANEOUS | Status: DC
  Filled 2019-10-28: qty 1

## 2019-10-28 MED ORDER — SIMETHICONE 80 MG PO CHEW
80.0000 mg | CHEWABLE_TABLET | Freq: Four times a day (QID) | ORAL | Status: DC | PRN
  Filled 2019-10-28: qty 1

## 2019-10-28 MED ORDER — ALBUMIN HUMAN 25 % IV SOLN
12.5000 g | Freq: Every day | INTRAVENOUS | Status: DC
  Administered 2019-10-28 – 2019-10-31 (×4): 12.5 g via INTRAVENOUS
  Filled 2019-10-28 (×5): qty 50

## 2019-10-28 MED ORDER — PANTOPRAZOLE SODIUM 40 MG PO TBEC
40.0000 mg | DELAYED_RELEASE_TABLET | Freq: Two times a day (BID) | ORAL | Status: DC
  Administered 2019-10-28 – 2019-10-31 (×7): 40 mg via ORAL
  Filled 2019-10-28 (×7): qty 1

## 2019-10-28 MED ORDER — FERROUS SULFATE 325 (65 FE) MG PO TABS
325.0000 mg | ORAL_TABLET | Freq: Every day | ORAL | Status: DC
  Administered 2019-10-29 – 2019-10-31 (×3): 325 mg via ORAL
  Filled 2019-10-28 (×4): qty 1

## 2019-10-28 MED ORDER — INSULIN ASPART 100 UNIT/ML ~~LOC~~ SOLN
0.0000 [IU] | Freq: Three times a day (TID) | SUBCUTANEOUS | Status: DC
  Administered 2019-10-31: 2 [IU] via SUBCUTANEOUS
  Filled 2019-10-28: qty 1

## 2019-10-28 MED ORDER — ONDANSETRON HCL 4 MG/2ML IJ SOLN: 4.0000 mg | Freq: Four times a day (QID) | INTRAMUSCULAR | Status: DC | PRN

## 2019-10-28 MED ORDER — ONDANSETRON HCL 4 MG PO TABS: 4.0000 mg | ORAL_TABLET | Freq: Four times a day (QID) | ORAL | Status: DC | PRN

## 2019-10-28 MED ORDER — ENOXAPARIN SODIUM 30 MG/0.3ML ~~LOC~~ SOLN
30.0000 mg | SUBCUTANEOUS | Status: DC
  Administered 2019-10-28 – 2019-10-29 (×2): 30 mg via SUBCUTANEOUS
  Filled 2019-10-28 (×2): qty 0.3

## 2019-10-28 MED ORDER — LACTATED RINGERS IV BOLUS
500.0000 mL | Freq: Once | INTRAVENOUS | Status: AC
  Administered 2019-10-28: 500 mL via INTRAVENOUS

## 2019-10-28 MED ORDER — SODIUM CHLORIDE 0.9 % IV SOLN: 250.0000 mL | INTRAVENOUS | Status: DC | PRN

## 2019-10-28 NOTE — ED Notes (Signed)
Date and time results received: 10/28/19 1247  (use smartphrase ".now" to insert current time)  Test: ABG Critical Value: O2 31  Name of Provider Notified: [place]  Orders Received? Or Actions Taken?: Placed on 2 L [place]. MD notified

## 2019-10-28 NOTE — ED Notes (Signed)
Pt very confused about to why he is in the hospital. RN explained to pt that EMS brought him in due to AMS and that his kidney function appears to be elevated. Pt repeatedly stating to RN that this does not make any sense. Pt is A & O to person and year only. Pt remains very confused about why he is at the hospital after being told by this RN several times.

## 2019-10-28 NOTE — ED Notes (Signed)
RN went to pull meds on pt. Meds not varied yet. Not pulling up on pyxis list

## 2019-10-28 NOTE — Consult Note (Signed)
[address] [place], [place] [address] Phone [phone]. Fax [phone]  Date: 10/28/2019                  Patient Name:  Christian Berg  MRN: [id number]  DOB: April 14, 1950  Age / Sex: 69 y.o., male         PCP: Birdie Sons, MD                 Service Requesting Consult: IM/ Collier Bullock, MD                 Reason for Consult: ARF            History of Present Illness: Patient is a 69 y.o. male with multiple medical problems was admitted to [facility] on 10/28/2019  Patient was brought in by EMS from home after he awoke from sleep and told his wife that he needed help.  His wife reports that he was unable to comprehend the situation or his words.  He was moaning, grunting.  His wife reports that he was discharged from this hospital on September 26 which was a Sunday.  Monday went okay.  Since Tuesday he has been feeling worse.  He has been unable to stand without assistance.  During the week he continued to get worse.  He needed assistance with eating and activities of daily living.  Therefore, they brought him to the hospital for further evaluation.  In addition, he is also experiencing diarrhea. His wife reports that earlier in the day he was quite confused.  However during the interview, he was oriented to self, time as well as place.  His wife reports that he still was not his usual self  At the time of discharge last week, his creatinine was 1.44/GFR 49 placing him in stage IIIa of CKD Presenting creatinine is 3.85, GFR 15  Medications: Outpatient medications: (Not in a hospital admission)   Current medications: Current Facility-Administered Medications  Medication Dose Route Frequency Provider Last Rate Last Admin  . 0.9 %  sodium chloride infusion  250 mL Intravenous PRN Agbata, Tochukwu, MD      . 0.9 %  sodium chloride infusion   Intravenous Continuous Agbata, Tochukwu, MD 100 mL/hr at 10/28/19 1025 New Bag at 10/28/19 1025  . albuterol (VENTOLIN HFA)  108 (90 Base) MCG/ACT inhaler 2 puff  2 puff Inhalation Q6H PRN Agbata, Tochukwu, MD      . aspirin EC tablet 81 mg  81 mg Oral QPM Agbata, Tochukwu, MD      . enoxaparin (LOVENOX) injection 30 mg  30 mg Subcutaneous Q24H Agbata, Tochukwu, MD   30 mg at 10/28/19 1021  . [START ON 10/29/2019] ferrous sulfate tablet 325 mg  325 mg Oral Q breakfast Agbata, Tochukwu, MD      . insulin aspart (novoLOG) injection 0-15 Units  0-15 Units Subcutaneous TID WC Agbata, Tochukwu, MD      . insulin aspart (novoLOG) injection 0-5 Units  0-5 Units Subcutaneous QHS Agbata, Tochukwu, MD      . insulin glargine (LANTUS) injection 10 Units  10 Units Subcutaneous QHS Agbata, Tochukwu, MD      . metoprolol tartrate (LOPRESSOR) tablet 50 mg  50 mg Oral BID Agbata, Tochukwu, MD      . multivitamin with minerals tablet 1 tablet  1 tablet Oral Daily Agbata, Tochukwu, MD   1 tablet at 10/28/19 1410  . ondansetron (ZOFRAN) tablet 4 mg  4 mg Oral Q6H PRN  Collier Bullock, MD       Or  . ondansetron (ZOFRAN) injection 4 mg  4 mg Intravenous Q6H PRN Agbata, Tochukwu, MD      . pantoprazole (PROTONIX) EC tablet 40 mg  40 mg Oral BID Agbata, Tochukwu, MD   40 mg at 10/28/19 1020  . pravastatin (PRAVACHOL) tablet 20 mg  20 mg Oral q1800 Agbata, Tochukwu, MD      . simethicone (MYLICON) chewable tablet 80 mg  80 mg Oral Q6H PRN Agbata, Tochukwu, MD      . sodium chloride flush (NS) 0.9 % injection 3 mL  3 mL Intravenous Q12H Agbata, Tochukwu, MD      . sodium chloride flush (NS) 0.9 % injection 3 mL  3 mL Intravenous PRN Agbata, Tochukwu, MD      . vancomycin (VANCOCIN) 50 mg/mL oral solution 125 mg  125 mg Oral QID Agbata, Tochukwu, MD   125 mg at 10/28/19 1411   Current Outpatient Medications  Medication Sig Dispense Refill  . albuterol (VENTOLIN HFA) 108 (90 Base) MCG/ACT inhaler Inhale 2 puffs into the lungs every 6 (six) hours as needed for wheezing or shortness of breath. 18 g 3  . ferrous sulfate 325 (65 FE) MG tablet Take  325 mg by mouth daily with breakfast.    . furosemide (LASIX) 20 MG tablet Take 1 tablet by mouth once daily (Patient taking differently: Take 60 mg by mouth daily. ) 90 tablet 0  . lisinopril (ZESTRIL) 20 MG tablet Take 20 mg by mouth daily.    [name] lovastatin (MEVACOR) 20 MG tablet Take 1 tablet (20 mg total) by mouth every evening. 90 tablet 3  . metFORMIN (GLUCOPHAGE) 1000 MG tablet Take 1 tablet by mouth twice daily (Patient taking differently: Take 500 mg by mouth 2 (two) times daily with a meal. ) 180 tablet 0  . metoprolol tartrate (LOPRESSOR) 50 MG tablet Take 1 tablet (50 mg total) by mouth 2 (two) times daily. 60 tablet 1  . Multiple Vitamin (MULTIVITAMIN WITH MINERALS) TABS tablet Take 1 tablet by mouth daily.    . pantoprazole (PROTONIX) 40 MG tablet Take 1 tablet (40 mg total) by mouth 2 (two) times daily. 60 tablet 1  . traMADol-acetaminophen (ULTRACET) 37.5-325 MG tablet Take 1 tablet by mouth every 6 (six) hours as needed for severe pain. 10 tablet 0  . traZODone (DESYREL) 50 MG tablet Take 1 tablet (50 mg total) by mouth at bedtime as needed for sleep. 30 tablet 0  . vancomycin (VANCOCIN) 125 MG capsule Take 1 capsule (125 mg total) by mouth 4 (four) times daily for 5 days. 20 capsule 0  . aspirin 81 MG EC tablet Take 81 mg by mouth every evening. (Patient not taking: Reported on 10/28/2019)    . hydrocerin (EUCERIN) CREA Apply 1 application topically daily. 113 g 0  . insulin glargine (LANTUS) 100 UNIT/ML Solostar Pen Inject 10 Units into the skin at bedtime. (Patient not taking: Reported on 10/28/2019) 15 mL 1  . ketoconazole (NIZORAL) 2 % cream Apply 1 application topically daily as needed for irritation.    . Nystatin (GERHARDT'S BUTT CREAM) CREA Apply 1 application topically 3 (three) times daily. 2 each 2  . simethicone (MYLICON) 80 MG chewable tablet Chew 1 tablet (80 mg total) by mouth every 6 (six) hours as needed for flatulence (or gas pains). 30 tablet 0       Allergies: Allergies  Allergen Reactions  . Clindamycin/Lincomycin  Chest discomfort  . Sulfa Antibiotics Hives    Causes Raised red patches all over body  . Sulfasalazine Hives    Causes raised red patches over body      Past Medical History: Past Medical History:  Diagnosis Date  . A-fib ([place])   . Carotid stenosis   . CHF (congestive heart failure) ([place])   . Degenerative disc disease, lumbar    s/p injury  . Diabetes mellitus without complication ([place])   . Disseminated superficial actinic porokeratosis   . Dysrhythmia    A-FIB, palpatations  . 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 Fatty liver   . History of degenerative disc disease   . History of kidney stones   . Hypercholesteremia   . Hypertension   . Kidney stones   . Obesity   . Pacemaker    inactive  . Peripheral vascular disease ([place])    Carotid stenosis  . Presence of permanent cardiac pacemaker    Inactive  . TIA (transient ischemic attack) 2011   No deficits  . TIA (transient ischemic attack)   . Vitamin B12 deficiency      Past Surgical History: Past Surgical History:  Procedure Laterality Date  . APPENDECTOMY  2004  . CARDIAC CATHETERIZATION    . CARDIAC PACEMAKER PLACEMENT  2005  . CATARACT EXTRACTION W/PHACO Right 06/14/2014   Procedure: CATARACT EXTRACTION PHACO AND INTRAOCULAR LENS PLACEMENT (IOC);  Surgeon: Leandrew Koyanagi, MD;  Location: [facility];  Service: Ophthalmology;  Laterality: Right;  . CATARACT EXTRACTION W/PHACO Left 10/27/2018   Procedure: CATARACT EXTRACTION PHACO AND INTRAOCULAR LENS PLACEMENT (IOC) LEFT DIABETIC  00:46.4  18.6%  8.63;  Surgeon: Leandrew Koyanagi, MD;  Location: [facility];  Service: Ophthalmology;  Laterality: Left;  Diabetic - oral meds  . CYSTOSCOPY W/ RETROGRADES Bilateral 08/09/2014   Procedure: CYSTOSCOPY WITH RETROGRADE PYELOGRAM;  Surgeon: Hollice Espy, MD;  Location: [facility];  Service: Urology;  Laterality: Bilateral;  .  ESOPHAGOGASTRODUODENOSCOPY (EGD) WITH PROPOFOL N/A 10/13/2019   Procedure: ESOPHAGOGASTRODUODENOSCOPY (EGD) WITH PROPOFOL;  Surgeon: Lesly Rubenstein, MD;  Location: [facility];  Service: Endoscopy;  Laterality: N/A;  . INTUBATION-ENDOTRACHEAL WITH TRACHEOSTOMY STANDBY  03/30/2017   Procedure: INTUBATION-ENDOTRACHEAL WITH TRACHEOSTOMY STANDBY;  Surgeon: Carloyn Manner, MD;  Location: [facility];  Service: ENT;;     Family History: Family History  Adopted: Yes  Problem Relation Age of Onset  . Heart disease Mother   . Fibromyalgia Sister   . Hypertension Daughter   . Migraines Daughter   . Kidney disease Neg Hx   . Prostate cancer Neg Hx   . Bladder Cancer Neg Hx      Social History: Social History   Socioeconomic History  . Marital status: Married    Spouse name: Not on file  . Number of children: 2  . Years of education: Not on file  . Highest education level: High school graduate  Occupational History  . Occupation: retired  . Occupation: disable  Tobacco Use  . Smoking status: Never Smoker  . Smokeless tobacco: Never Used  Vaping Use  . Vaping Use: Never used  Substance and Sexual Activity  . Alcohol use: No    Alcohol/week: 0.0 standard drinks  . Drug use: No  . Sexual activity: Not on file  Other Topics Concern  . Not on file  Social History Narrative   Lives at home with wife. Independent at baseline   Social Determinants of Health   Financial Resource Strain: Low Risk   .  Difficulty of Paying Living Expenses: Not hard at all  Food Insecurity: No Food Insecurity  . Worried About Charity fundraiser in the Last Year: Never true  . Ran Out of Food in the Last Year: Never true  Transportation Needs: No Transportation Needs  . Lack of Transportation (Medical): No  . Lack of Transportation (Non-Medical): No  Physical Activity: Inactive  . Days of Exercise per Week: 0 days  . Minutes of Exercise per Session: 0 min  Stress: No Stress Concern Present   . Feeling of Stress : Not at all  Social Connections: Moderately Isolated  . Frequency of Communication with Friends and Family: Twice a week  . Frequency of Social Gatherings with Friends and Family: Once a week  . Attends Religious Services: Never  . Active Member of Clubs or Organizations: No  . Attends Archivist Meetings: Never  . Marital Status: Married  Human resources officer Violence: Not At Risk  . Fear of Current or Ex-Partner: No  . Emotionally Abused: No  . Physically Abused: No  . Sexually Abused: No     Review of Systems: Gen: Generalized weakness, no fevers or chills HEENT: Decreased hearing, tongue is red CV: No chest pain Resp: Mild shortness of breath GI: Reports loose stools at home.  Decreased appetite.  Has history of cirrhosis GU : No problems reported with voiding MS: Not able to walk at home due to weakness Derm:    Chronic leg lesions Psych: Wife reports confusion Heme: No complaints Neuro: Confusion with words, generalized weakness Endocrine.  No complaints  Vital Signs: Blood pressure 113/73, pulse 66, temperature 97.8 F (36.6 C), temperature source Oral, resp. rate 17, weight 128.2 kg, SpO2 99 %.  No intake or output data in the 24 hours ending 10/28/19 1547  Weight trends: Filed Weights   10/28/19 0347  Weight: 128.2 kg    Physical Exam: General:  Obese gentleman, laying in the bed no acute distress  HEENT  moist oral mucous membranes, reddish tongue  Neck:  Supple, no masses  Lungs:  Normal breathing effort, decreased breath sounds at bases, room air  Heart::  No rub or gallop  Abdomen:  Soft, obese  Extremities:  2-3+ dependent edema up to lower abdomen  Neurologic:  Alert, able to answer simple questions, oriented to self and place and time  Skin:  Chronic rounded, scaly lesions on the legs bilaterally    Lab results: Basic Metabolic Panel: Recent Labs  Lab 10/22/19 0608 10/23/19 0609 10/28/19 0417  NA 135 134* 132*   K 4.3 4.1 4.5  CL 100 100 96*  CO2 28 27 24   GLUCOSE 152* 151* 119*  BUN 16 16 28*  CREATININE 1.41* 1.44* 3.85*  CALCIUM 8.1* 8.0* 8.3*  MG 2.2 2.1 2.2    Liver Function Tests: Recent Labs  Lab 10/28/19 0417  AST 40  ALT 19  ALKPHOS 136*  BILITOT 1.4*  PROT 7.1  ALBUMIN 2.5*   No results for input(s): LIPASE, AMYLASE in the last 168 hours. Recent Labs  Lab 10/28/19 0417  AMMONIA 13    CBC: Recent Labs  Lab 10/23/19 0609 10/28/19 0417  WBC 6.9 7.7  NEUTROABS  --  5.7  HGB 8.5* 9.5*  HCT 27.5* 29.9*  MCV 98.9 96.8  PLT 250 232    Cardiac Enzymes: No results for input(s): CKTOTAL, TROPONINI in the last 168 hours.  BNP: Invalid input(s): POCBNP  CBG: Recent Labs  Lab 10/22/19 1743 10/22/19 2119  10/23/19 0728 10/23/19 1142 10/28/19 1401  GLUCAP 196* 176* 123* 136* 102*    Microbiology: Recent Results (from the past 720 hour(s))  SARS Coronavirus 2 by RT PCR (hospital order, performed in [facility] hospital lab) Nasopharyngeal Nasopharyngeal Swab     Status: None   Collection Time: 10/09/19 12:02 AM   Specimen: Nasopharyngeal Swab  Result Value Ref Range Status   SARS Coronavirus 2 NEGATIVE NEGATIVE Final    Comment: (NOTE) SARS-CoV-2 target nucleic acids are NOT DETECTED.  The SARS-CoV-2 RNA is generally detectable in upper and lower respiratory specimens during the acute phase of infection. The lowest concentration of SARS-CoV-2 viral copies this assay can detect is 250 copies / mL. A negative result does not preclude SARS-CoV-2 infection and should not be used as the sole basis for treatment or other patient management decisions.  A negative result may occur with improper specimen collection / handling, submission of specimen other than nasopharyngeal swab, presence of viral mutation(s) within the areas targeted by this assay, and inadequate number of viral copies (<250 copies / mL). A negative result must be combined with  clinical observations, patient history, and epidemiological information.  Fact Sheet for Patients:   StrictlyIdeas.no  Fact Sheet for Healthcare Providers: BankingDealers.co.za  This test is not yet approved or  cleared by the [place] FDA and has been authorized for detection and/or diagnosis of SARS-CoV-2 by FDA under an Emergency Use Authorization (EUA).  This EUA will remain in effect (meaning this test can be used) for the duration of the COVID-19 declaration under Section 564(b)(1) of the Act, 21 U.S.C. section 360bbb-3(b)(1), unless the authorization is terminated or revoked sooner.  Performed at [facility], [address] [place], [place] [address]   Gastrointestinal Panel by PCR , Stool     Status: None   Collection Time: 10/11/19  4:07 PM   Specimen: Stool  Result Value Ref Range Status   Campylobacter species NOT DETECTED NOT DETECTED Final   Plesimonas shigelloides NOT DETECTED NOT DETECTED Final   Salmonella species NOT DETECTED NOT DETECTED Final   Yersinia enterocolitica NOT DETECTED NOT DETECTED Final   Vibrio species NOT DETECTED NOT DETECTED Final   Vibrio cholerae NOT DETECTED NOT DETECTED Final   Enteroaggregative E coli (EAEC) NOT DETECTED NOT DETECTED Final   Enteropathogenic E coli (EPEC) NOT DETECTED NOT DETECTED Final   Enterotoxigenic E coli (ETEC) NOT DETECTED NOT DETECTED Final   Shiga like toxin producing E coli (STEC) NOT DETECTED NOT DETECTED Final   Shigella/Enteroinvasive E coli (EIEC) NOT DETECTED NOT DETECTED Final   Cryptosporidium NOT DETECTED NOT DETECTED Final   Cyclospora cayetanensis NOT DETECTED NOT DETECTED Final   Entamoeba histolytica NOT DETECTED NOT DETECTED Final   Giardia lamblia NOT DETECTED NOT DETECTED Final   Adenovirus F40/41 NOT DETECTED NOT DETECTED Final   Astrovirus NOT DETECTED NOT DETECTED Final   Norovirus GI/GII NOT DETECTED NOT DETECTED Final    Rotavirus A NOT DETECTED NOT DETECTED Final   Sapovirus (I, II, IV, and V) NOT DETECTED NOT DETECTED Final    Comment: Performed at [facility], [address] [place], [place] [address]  CULTURE, BLOOD (ROUTINE X 2) w Reflex to ID Panel     Status: None   Collection Time: 10/16/19 12:21 PM   Specimen: BLOOD  Result Value Ref Range Status   Specimen Description BLOOD [facility]  Final   Special Requests   Final    BOTTLES DRAWN AEROBIC AND ANAEROBIC  Blood Culture adequate volume   Culture   Final    NO GROWTH 5 DAYS Performed at [facility], [address] [place], [place] [address]    Report Status 10/21/2019 FINAL  Final  CULTURE, BLOOD (ROUTINE X 2) w Reflex to ID Panel     Status: None   Collection Time: 10/16/19 12:32 PM   Specimen: BLOOD  Result Value Ref Range Status   Specimen Description BLOOD [facility]  Final   Special Requests   Final    BOTTLES DRAWN AEROBIC ONLY Blood Culture adequate volume   Culture   Final    NO GROWTH 5 DAYS Performed at [facility] [place], [address] [place], [place] [address]    Report Status 10/21/2019 FINAL  Final  Urine Culture     Status: None   Collection Time: 10/16/19  3:34 PM   Specimen: Urine, Random  Result Value Ref Range Status   Specimen Description   Final    URINE, RANDOM Performed at [facility] [address] [place], [place] [address]    Special Requests   Final    NONE Performed at [facility], [address] [place], [place] [address]    Culture   Final    NO GROWTH Performed at [facility], [place] [address] [place], [place] [address]    Report Status 10/17/2019 FINAL  Final  C Difficile Quick Screen (NO PCR Reflex)     Status: Abnormal   Collection Time: 10/17/19  8:15 PM   Specimen: STOOL  Result Value Ref Range Status   C Diff antigen POSITIVE (A) NEGATIVE Final   C Diff toxin POSITIVE (A) NEGATIVE Final   C Diff interpretation Toxin producing C. difficile  detected.  Final    Comment: CRITICAL RESULT CALLED TO, READ BACK BY AND VERIFIED WITH: DENISE [facility] 10/17/19 AT 2127 BY ACR Performed at [facility], [address] [place], [place] [address]   Culture, blood (routine x 2)     Status: None (Preliminary result)   Collection Time: 10/28/19  4:17 AM   Specimen: BLOOD  Result Value Ref Range Status   Specimen Description BLOOD LEFT ANTECUBITAL  Final   Special Requests   Final    BOTTLES DRAWN AEROBIC AND ANAEROBIC Blood Culture adequate volume   Culture   Final    NO GROWTH < 12 HOURS Performed at [facility], [address] [place], [place] [address]    Report Status PENDING  Incomplete  Culture, blood (routine x 2)     Status: None (Preliminary result)   Collection Time: 10/28/19  4:17 AM   Specimen: BLOOD  Result Value Ref Range Status   Specimen Description BLOOD BLOOD RIGHT WRIST  Final   Special Requests   Final    BOTTLES DRAWN AEROBIC AND ANAEROBIC Blood Culture adequate volume   Culture   Final    NO GROWTH < 12 HOURS Performed at [facility], [address] [place], [place] [address]    Report Status PENDING  Incomplete  Respiratory Panel by RT PCR (Flu A&B, Covid) - Nasopharyngeal Swab     Status: None   Collection Time: 10/28/19  4:17 AM   Specimen: Nasopharyngeal Swab  Result Value Ref Range Status   SARS Coronavirus 2 by RT PCR NEGATIVE NEGATIVE Final    Comment: (NOTE) SARS-CoV-2 target nucleic acids are NOT DETECTED.  The SARS-CoV-2 RNA is generally detectable in upper respiratoy specimens during  the acute phase of infection. The lowest concentration of SARS-CoV-2 viral copies this assay can detect is 131 copies/mL. A negative result does not preclude SARS-Cov-2 infection and should not be used as the sole basis for treatment or other patient management decisions. A negative result may occur with  improper specimen collection/handling, submission of specimen other than  nasopharyngeal swab, presence of viral mutation(s) within the areas targeted by this assay, and inadequate number of viral copies (<131 copies/mL). A negative result must be combined with clinical observations, patient history, and epidemiological information. The expected result is Negative.  Fact Sheet for Patients:  PinkCheek.be  Fact Sheet for Healthcare Providers:  GravelBags.it  This test is no t yet approved or cleared by the [place] FDA and  has been authorized for detection and/or diagnosis of SARS-CoV-2 by FDA under an Emergency Use Authorization (EUA). This EUA will remain  in effect (meaning this test can be used) for the duration of the COVID-19 declaration under Section 564(b)(1) of the Act, 21 U.S.C. section 360bbb-3(b)(1), unless the authorization is terminated or revoked sooner.     Influenza A by PCR NEGATIVE NEGATIVE Final   Influenza B by PCR NEGATIVE NEGATIVE Final    Comment: (NOTE) The Xpert Xpress SARS-CoV-2/FLU/RSV assay is intended as an aid in  the diagnosis of influenza from Nasopharyngeal swab specimens and  should not be used as a sole basis for treatment. Nasal washings and  aspirates are unacceptable for Xpert Xpress SARS-CoV-2/FLU/RSV  testing.  Fact Sheet for Patients: PinkCheek.be  Fact Sheet for Healthcare Providers: GravelBags.it  This test is not yet approved or cleared by the [place] FDA and  has been authorized for detection and/or diagnosis of SARS-CoV-2 by  FDA under an Emergency Use Authorization (EUA). This EUA will remain  in effect (meaning this test can be used) for the duration of the  Covid-19 declaration under Section 564(b)(1) of the Act, 21  U.S.C. section 360bbb-3(b)(1), unless the authorization is  terminated or revoked. Performed at [facility], [address] [place], [place]  [address]      Coagulation Studies: Recent Labs    10/28/19 0417  LABPROT 19.1*  INR 1.7*    Urinalysis: Recent Labs    10/28/19 0506  COLORURINE AMBER*  LABSPEC 1.025  PHURINE 5.0  GLUCOSEU NEGATIVE  HGBUR NEGATIVE  BILIRUBINUR NEGATIVE  KETONESUR NEGATIVE  PROTEINUR NEGATIVE  NITRITE NEGATIVE  LEUKOCYTESUR NEGATIVE        Imaging: CT Head Wo Contrast  Result Date: 10/28/2019 CLINICAL DATA:  Mental status changes. EXAM: CT HEAD WITHOUT CONTRAST TECHNIQUE: Contiguous axial images were obtained from the base of the skull through the vertex without intravenous contrast. COMPARISON:  08/08/2019 FINDINGS: Brain: Mild cerebral atrophy. Mild ventricular dilatation consistent with central atrophy. Low-attenuation changes in the deep white matter consistent with small vessel ischemia. Encephalomalacia in the right occipital lobe consistent with old infarct. No change in appearance since prior study. No mass-effect or midline shift. No abnormal extra-axial fluid collections. Basal cisterns are not effaced. No acute intracranial hemorrhage. Vascular: Intracranial arterial vascular calcifications are present. Skull: Calvarium appears intact. Sinuses/Orbits: Paranasal sinuses and mastoid air cells are clear. Other: None. IMPRESSION: 1. No acute intracranial abnormalities. 2. Chronic atrophy and small vessel ischemia. Old right occipital infarct. Electronically Signed   By: Lucienne Capers M.D.   On: 10/28/2019 04:49   DG Chest Portable 1 View  Result Date: 10/28/2019 CLINICAL DATA:  Altered mental status. EXAM: PORTABLE CHEST 1 VIEW  COMPARISON:  10/16/2019 FINDINGS: Cardiac pacemaker. Shallow inspiration. Cardiac enlargement with central pulmonary vascular congestion. No definite edema or consolidation. No pleural effusions. No pneumothorax. Mediastinal contours appear intact. Calcification of the aorta. IMPRESSION: Cardiac enlargement with central pulmonary vascular congestion. Electronically  Signed   By: Lucienne Capers M.D.   On: 10/28/2019 04:25      Assessment & Plan: Pt is a 69 y.o.   male with diabetes, hypertension, BPH atrial fibrillation, morbid obesity, Karlene Lineman, cirrhosis of the liver artery stenosis, chronic kidney disease, diabetic nephropathy, congestive heart failure, chronic brain atrophy, small vessel ischemia, old right occipital infarct, leg wounds, hospitalization for C. difficile in September 2021, was admitted on 10/28/2019 with Generalized weakness [R53.1]   #Acute kidney injury on chronic kidney disease stage IIIa -Cause for acute kidney injury is not clear but may be related to intravascular volume depletion As patient's oral intake has been low.  Outpatient he was on furosemide 60 mg daily, lisinopril 20 mg daily, Metformin, metoprolol,  #Volume overload #Cirrhosis of the liver (non alcoholic) #Hypoalbuminemia -Currently getting IV fluids to improve hypotension -At this time, would discontinue further IV fluid supplementation -Start oral midodrine -We will start IV albumin supplementation for oncotic support  -Can consider starting IV furosemide infusion for management of anasarca  #Recent C. difficile colitis -Was being treated with oral vancomycin       LOS: 0 Maisa Bedingfield 10/1/20213:47 PM    Note: This note was prepared with Dragon dictation. Any transcription errors are unintentional

## 2019-10-28 NOTE — ED Provider Notes (Signed)
[facility] Emergency Department Provider Note   ____________________________________________   First MD Initiated Contact with Patient 10/28/19 [phone]     (approximate)  I have reviewed the triage vital signs and the nursing notes.   HISTORY  Chief Complaint Altered Mental Status    HPI ANTONE SUMMONS is a 69 y.o. male 69 year old male with past medical history of hypertension, diabetes, CKD, asthma, chronic atrial fibrillation on Coumadin, and cirrhosis who presents to the ED for altered mental status.  History is limited due to patient's altered mental status.  Per EMS, patient woke his wife up this morning and seemed less alert and more confused than usual, prompting her to call EMS.  Speaking to his wife over the phone, patient has been gradually declining since he was discharged from the hospital 4 days ago.  She states that the patient has become increasingly weak and spent more and more time sleeping.  He has not had any fevers but has consistently seem chilled.  He will occasionally complain of abdominal pain but has not had any chest pain, cough, or shortness of breath.  Wife states that he has been dealing with episodes of confusion ever since he left the hospital.  He is currently being treated for C. difficile following his recent admission.        Past Medical History:  Diagnosis Date  . A-fib ([place])   . Carotid stenosis   . CHF (congestive heart failure) ([place])   . Degenerative disc disease, lumbar    s/p injury  . Diabetes mellitus without complication ([place])   . Disseminated superficial actinic porokeratosis   . Dysrhythmia    A-FIB, palpatations  . 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 Fatty liver   . History of degenerative disc disease   . History of kidney stones   . Hypercholesteremia   . Hypertension   . Kidney stones   . Obesity   . Pacemaker    inactive  . Peripheral vascular disease ([place])    Carotid stenosis  . Presence of permanent  cardiac pacemaker    Inactive  . TIA (transient ischemic attack) 2011   No deficits  . TIA (transient ischemic attack)   . Vitamin B12 deficiency     Patient Active Problem List   Diagnosis Date Noted  . C. difficile colitis 10/18/2019  . Acute exacerbation of CHF (congestive heart failure) ([place]) 10/08/2019  . Leg wound, left, initial encounter 10/08/2019  . Acquired thrombophilia ([place])   . Chronic low back pain   . Chronic anticoagulation 08/04/2019  . CKD stage 3 due to type 2 diabetes mellitus ([place]) 08/04/2019  . Intractable diarrhea 08/04/2019  . Iron deficiency anemia 06/02/2019  . Mixed simple and mucopurulent chronic bronchitis ([place]) 05/31/2019  . Moderate tricuspid regurgitation by prior echocardiogram 12/16/2018  . NASH (nonalcoholic steatohepatitis) 10/22/2015  . History of embolic stroke 11/20/8525  . Morbid (severe) obesity due to excess calories ([place]) 11/22/2014  . VFD (visual field defect) 11/22/2014  . History of TIA (transient ischemic attack) 10/31/2014  . Kidney stones 07/24/2014  . Other cirrhosis of liver ([place]) 07/24/2014  . DSAP (disseminated superficial actinic porokeratosis) 07/17/2014  . Asthma 05/31/2014  . (HFpEF) heart failure with preserved ejection fraction ([place]) 05/31/2014  . Diabetes mellitus ([place]) 05/31/2014  . Hypercholesteremia 05/31/2014  . B12 deficiency 05/31/2014  . Carotid artery narrowing 02/15/2014  . Bilateral carotid artery stenosis 02/15/2014  . Chronic atrial fibrillation ([place]) 10/24/2013  . Benign prostatic hyperplasia with urinary  obstruction 09/06/2012  . Heart disease 05/29/2009  . Benign essential HTN 09/29/2008  . Narrowing of intervertebral disc space 01/11/2007    Past Surgical History:  Procedure Laterality Date  . APPENDECTOMY  2004  . CARDIAC CATHETERIZATION    . CARDIAC PACEMAKER PLACEMENT  2005  . CATARACT EXTRACTION W/PHACO Right 06/14/2014   Procedure: CATARACT EXTRACTION PHACO AND INTRAOCULAR LENS PLACEMENT  (IOC);  Surgeon: Leandrew Koyanagi, MD;  Location: [place];  Service: Ophthalmology;  Laterality: Right;  . CATARACT EXTRACTION W/PHACO Left 10/27/2018   Procedure: CATARACT EXTRACTION PHACO AND INTRAOCULAR LENS PLACEMENT (IOC) LEFT DIABETIC  00:46.4  18.6%  8.63;  Surgeon: Leandrew Koyanagi, MD;  Location: [place];  Service: Ophthalmology;  Laterality: Left;  Diabetic - oral meds  . CYSTOSCOPY W/ RETROGRADES Bilateral 08/09/2014   Procedure: CYSTOSCOPY WITH RETROGRADE PYELOGRAM;  Surgeon: Hollice Espy, MD;  Location: [facility];  Service: Urology;  Laterality: Bilateral;  . ESOPHAGOGASTRODUODENOSCOPY (EGD) WITH PROPOFOL N/A 10/13/2019   Procedure: ESOPHAGOGASTRODUODENOSCOPY (EGD) WITH PROPOFOL;  Surgeon: Lesly Rubenstein, MD;  Location: [facility];  Service: Endoscopy;  Laterality: N/A;  . INTUBATION-ENDOTRACHEAL WITH TRACHEOSTOMY STANDBY  03/30/2017   Procedure: INTUBATION-ENDOTRACHEAL WITH TRACHEOSTOMY STANDBY;  Surgeon: Carloyn Manner, MD;  Location: [facility];  Service: ENT;;    Prior to Admission medications   Medication Sig Start Date End Date Taking? Authorizing Provider  albuterol (VENTOLIN HFA) 108 (90 Base) MCG/ACT inhaler Inhale 2 puffs into the lungs every 6 (six) hours as needed for wheezing or shortness of breath. 05/31/19   Birdie Sons, MD  ferrous sulfate 325 (65 FE) MG tablet Take 325 mg by mouth daily with breakfast.    [provider]  furosemide (LASIX) 20 MG tablet Take 1 tablet by mouth once daily Patient taking differently: Take 60 mg by mouth daily.  06/18/19   Birdie Sons, MD  hydrocerin (EUCERIN) CREA Apply 1 application topically daily. 10/24/19   Nicole Kindred A, DO  insulin glargine (LANTUS) 100 UNIT/ML Solostar Pen Inject 10 Units into the skin at bedtime. 10/23/19   Ezekiel Slocumb, DO  ketoconazole (NIZORAL) 2 % cream Apply 1 application topically daily as needed for irritation. Patient not taking: Reported on  10/09/2019 08/06/19   Loletha Grayer, MD  lovastatin (MEVACOR) 20 MG tablet Take 1 tablet (20 mg total) by mouth every evening. 04/08/19   Birdie Sons, MD  metFORMIN (GLUCOPHAGE) 1000 MG tablet Take 1 tablet by mouth twice daily Patient taking differently: Take 500 mg by mouth 2 (two) times daily with a meal.  08/29/19   Birdie Sons, MD  metoprolol tartrate (LOPRESSOR) 50 MG tablet Take 1 tablet (50 mg total) by mouth 2 (two) times daily. 10/23/19   Ezekiel Slocumb, DO  Multiple Vitamin (MULTIVITAMIN WITH MINERALS) TABS tablet Take 1 tablet by mouth daily. 10/24/19   Ezekiel Slocumb, DO  Nystatin (GERHARDT'S BUTT CREAM) CREA Apply 1 application topically 3 (three) times daily. 10/23/19   Ezekiel Slocumb, DO  pantoprazole (PROTONIX) 40 MG tablet Take 1 tablet (40 mg total) by mouth 2 (two) times daily. 10/23/19 12/22/19  Ezekiel Slocumb, DO  simethicone (MYLICON) 80 MG chewable tablet Chew 1 tablet (80 mg total) by mouth every 6 (six) hours as needed for flatulence (or gas pains). 10/23/19   Ezekiel Slocumb, DO  traMADol-acetaminophen (ULTRACET) 37.5-325 MG tablet Take 1 tablet by mouth every 6 (six) hours as needed for severe pain. 08/06/19   Loletha Grayer, MD  traZODone (DESYREL) 50 MG tablet Take 1 tablet (50 mg total) by mouth at bedtime as needed for sleep. 10/23/19   Ezekiel Slocumb, DO  vancomycin (VANCOCIN) 125 MG capsule Take 1 capsule (125 mg total) by mouth 4 (four) times daily for 5 days. 10/23/19 10/28/19  Nicole Kindred A, DO    Allergies Clindamycin/lincomycin, Sulfa antibiotics, and Sulfasalazine  Family History  Adopted: Yes  Problem Relation Age of Onset  . Heart disease Mother   . Fibromyalgia Sister   . Hypertension Daughter   . Migraines Daughter   . Kidney disease Neg Hx   . Prostate cancer Neg Hx   . Bladder Cancer Neg Hx     Social History Social History   Tobacco Use  . Smoking status: Never Smoker  . Smokeless tobacco: Never Used  Vaping Use   . Vaping Use: Never used  Substance Use Topics  . Alcohol use: No    Alcohol/week: 0.0 standard drinks  . Drug use: No    Review of Systems Unable to obtain secondary to altered mental status  ____________________________________________   PHYSICAL EXAM:  VITAL SIGNS: ED Triage Vitals  Enc Vitals Group     BP 10/28/19 0336 (!) 81/39     Pulse Rate 10/28/19 0336 80     Resp 10/28/19 0336 (!) 22     Temp 10/28/19 0336 (!) 97.4 F (36.3 C)     Temp Source 10/28/19 0336 Oral     SpO2 10/28/19 0336 98 %     Weight 10/28/19 0347 282 lb 10.1 oz (128.2 kg)     Height --      Head Circumference --      Peak Flow --      Pain Score --      Pain Loc --      Pain Edu? --      Excl. in [place]? --     Constitutional: Alert and oriented to person, but not place or time. Eyes: Conjunctivae are normal.  Pupils equal round and reactive to light bilaterally. Head: Atraumatic. Nose: No congestion/rhinnorhea. Mouth/Throat: Mucous membranes are dry. Neck: Normal ROM Cardiovascular: Normal rate, regular rhythm. Grossly normal heart sounds. Respiratory: Normal respiratory effort.  No retractions. Lungs CTAB. Gastrointestinal: Soft and diffusely tender to palpation with no rebound or guarding. No distention. Genitourinary: deferred Musculoskeletal: No lower extremity tenderness, 2+ pitting edema to thighs bilaterally. Neurologic: Slow groaning speech pattern. No gross focal neurologic deficits are appreciated. Skin:  Skin is warm, dry and intact. No rash noted. Psychiatric: Mood and affect are normal. Speech and behavior are normal.  ____________________________________________   LABS (all labs ordered are listed, but only abnormal results are displayed)  Labs Reviewed  CBC WITH DIFFERENTIAL/PLATELET - Abnormal; Notable for the following components:      Result Value   RBC 3.09 (*)    Hemoglobin 9.5 (*)    HCT 29.9 (*)    RDW 19.9 (*)    All other components within normal limits   COMPREHENSIVE METABOLIC PANEL - Abnormal; Notable for the following components:   Sodium 132 (*)    Chloride 96 (*)    Glucose, Bld 119 (*)    BUN 28 (*)    Creatinine, Ser 3.85 (*)    Calcium 8.3 (*)    Albumin 2.5 (*)    Alkaline Phosphatase 136 (*)    Total Bilirubin 1.4 (*)    GFR calc non Af Amer 15 (*)    GFR calc Af  Amer 17 (*)    All other components within normal limits  URINALYSIS, COMPLETE (UACMP) WITH MICROSCOPIC - Abnormal; Notable for the following components:   Color, Urine AMBER (*)    APPearance HAZY (*)    All other components within normal limits  PROTIME-INR - Abnormal; Notable for the following components:   Prothrombin Time 19.1 (*)    INR 1.7 (*)    All other components within normal limits  TROPONIN I (HIGH SENSITIVITY) - Abnormal; Notable for the following components:   Troponin I (High Sensitivity) 140 (*)    All other components within normal limits  CULTURE, BLOOD (ROUTINE X 2)  CULTURE, BLOOD (ROUTINE X 2)  RESPIRATORY PANEL BY RT PCR (FLU A&B, COVID)  AMMONIA  LACTIC ACID, PLASMA  LACTIC ACID, PLASMA  PROCALCITONIN  MAGNESIUM  BRAIN NATRIURETIC PEPTIDE  TROPONIN I (HIGH SENSITIVITY)   ____________________________________________  EKG  ED ECG REPORT I, Blake Divine, the attending physician, personally viewed and interpreted this ECG.   Date: 10/28/2019  EKG Time: 3:59  Rate: 86  Rhythm: atrial fibrillation, rate 86  Axis: Normal  Intervals:Prolonged QT  ST&T Change: None   PROCEDURES  Procedure(s) performed (including Critical Care):  Procedures   ____________________________________________   INITIAL IMPRESSION / ASSESSMENT AND PLAN / ED COURSE       69 year old male with past medical history of hypertension, diabetes, CKD, asthma, chronic atrial fibrillation on Coumadin, and cirrhosis who presents to the ED for generalized weakness and confusion.  Patient is awake and alert on my evaluation, but oriented to self only.   He does not seem to have any focal neurologic deficits and CT head is negative for acute process.  There are no signs of infection on his chest x-ray and infection seems to be less likely as the cause of his weakness and confusion.  Procalcitonin is negative and he has no leukocytosis.  Patient has no shortness of breath but he does seem to have some mild pulmonary edema on his chest x-ray and lower extremity edema.  Despite this, he has a significant AKI that could be related to overdiuresis from his recent admission.  Given he has no respiratory symptoms, we will cautiously hydrate the patient with fluids, he was given 500 cc of LR for now.  Case discussed with hospitalist for admission for AKI and confusion.      ____________________________________________   FINAL CLINICAL IMPRESSION(S) / ED DIAGNOSES  Final diagnoses:  Altered mental status, unspecified altered mental status type  AKI (acute kidney injury) ([place])  Generalized weakness     ED Discharge Orders    None       Note:  This document was prepared using Dragon voice recognition software and may include unintentional dictation errors.   Blake Divine, MD 10/28/19 [phone]

## 2019-10-28 NOTE — ED Notes (Signed)
Pt given water and warm blanket by supplemental staff

## 2019-10-28 NOTE — Plan of Care (Signed)

## 2019-10-28 NOTE — ED Notes (Signed)
EDP notified face-to-face of critical troponin: 140

## 2019-10-28 NOTE — ED Notes (Signed)
Patient taken off oxygen per [place] MD

## 2019-10-28 NOTE — H&P (Signed)
History and Physical    Christian Berg GYJ:[id number] DOB: 10-17-1950 DOA: 10/28/2019  PCP: Birdie Sons, MD   Patient coming from: Home  I have personally briefly reviewed patient's old medical records in [place]  Chief Complaint: Weakness                                Change in mental status   Most of the history is obtained from patient's wife over the phone.  Patient is unable to provide any history due to his mental status changes.  HPI: Christian Berg is a 69 y.o. male with medical history significant for hypertension, diabetes mellitus with complications of stage III chronic kidney disease, chronic atrial fibrillation on Coumadin, history of congestive heart failure and disseminated superficial actinic porokeratosis who presents to the ER by EMS for evaluation of mental status changes.  Patient's wife states that he was discharged about 4 days ago and since his discharge has been confused, somnolent and requiring increased assistance at home.  He is unable to ambulate and she has had to feed him because he is unable to stay awake enough to feed himself. Patient was recently discharged from the hospital following a prolonged hospitalization for acute on chronic diastolic dysfunction CHF.  His hospital course was complicated by acute blood loss anemia from a GI source.  Patient was transfused 1 unit of packed RBC and had an EGD which showed an actively bleeding duodenal ulcer.  He also developed C. difficile diarrhea and was started on vancomycin which she was discharged home on. I am unable to do a review of systems on this patient due to his mental status changes. Labs show sodium 132, potassium 4.5, chloride 96, bicarb 24, BUN 28, creatinine 3.85 compared to baseline of 1.4, calcium 8.3, magnesium 2.2, alkaline phosphatase 136, albumin 2.5, AST 40, ALT 19, total protein 7.1, BNP 439, troponin 140 >> 136, lactic acid 1.9, hemoglobin 9.5, white count 7.7, hematocrit 29.9,  MCV 96.8, RDW 19.9, platelet count 232, INR 1.7 CT scan of the head without contrast shows no acute intracranial abnormalities.  Chronic atrophy and small vessel ischemia.  Old right occipital infarct. Chest x-ray reviewed by me shows cardiac enlargement with central pulmonary vascular congestion Twelve-lead EKG reviewed by me shows atrial fibrillation with low voltage QRS   ED Course: Patient is a 69 year old Caucasian male who was brought into the ER by EMS for evaluation of confusion, increased somnolence, generalized weakness and inability to ambulate.  Labs show worsening of his renal function from baseline.  His baseline serum creatinine is 1.4 and today on admission it is 3.85.  Patient was recently discharged from the hospital after treatment for acute on chronic diastolic dysfunction CHF.  He will be admitted to the hospital for further evaluation.  Review of Systems: As per HPI otherwise 10 point review of systems negative.    Past Medical History:  Diagnosis Date  . A-fib ([place])   . Carotid stenosis   . CHF (congestive heart failure) ([place])   . Degenerative disc disease, lumbar    s/p injury  . Diabetes mellitus without complication ([place])   . Disseminated superficial actinic porokeratosis   . Dysrhythmia    A-FIB, palpatations  . 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 Fatty liver   . History of degenerative disc disease   . History of kidney stones   . Hypercholesteremia   . Hypertension   .  Kidney stones   . Obesity   . Pacemaker    inactive  . Peripheral vascular disease ([place])    Carotid stenosis  . Presence of permanent cardiac pacemaker    Inactive  . TIA (transient ischemic attack) 2011   No deficits  . TIA (transient ischemic attack)   . Vitamin B12 deficiency     Past Surgical History:  Procedure Laterality Date  . APPENDECTOMY  2004  . CARDIAC CATHETERIZATION    . CARDIAC PACEMAKER PLACEMENT  2005  . CATARACT EXTRACTION W/PHACO Right 06/14/2014   Procedure: CATARACT  EXTRACTION PHACO AND INTRAOCULAR LENS PLACEMENT (IOC);  Surgeon: Leandrew Koyanagi, MD;  Location: [facility];  Service: Ophthalmology;  Laterality: Right;  . CATARACT EXTRACTION W/PHACO Left 10/27/2018   Procedure: CATARACT EXTRACTION PHACO AND INTRAOCULAR LENS PLACEMENT (IOC) LEFT DIABETIC  00:46.4  18.6%  8.63;  Surgeon: Leandrew Koyanagi, MD;  Location: [facility];  Service: Ophthalmology;  Laterality: Left;  Diabetic - oral meds  . CYSTOSCOPY W/ RETROGRADES Bilateral 08/09/2014   Procedure: CYSTOSCOPY WITH RETROGRADE PYELOGRAM;  Surgeon: Hollice Espy, MD;  Location: [facility];  Service: Urology;  Laterality: Bilateral;  . ESOPHAGOGASTRODUODENOSCOPY (EGD) WITH PROPOFOL N/A 10/13/2019   Procedure: ESOPHAGOGASTRODUODENOSCOPY (EGD) WITH PROPOFOL;  Surgeon: Lesly Rubenstein, MD;  Location: [facility];  Service: Endoscopy;  Laterality: N/A;  . INTUBATION-ENDOTRACHEAL WITH TRACHEOSTOMY STANDBY  03/30/2017   Procedure: INTUBATION-ENDOTRACHEAL WITH TRACHEOSTOMY STANDBY;  Surgeon: Carloyn Manner, MD;  Location: [facility];  Service: ENT;;     reports that he has never smoked. He has never used smokeless tobacco. He reports that he does not drink alcohol and does not use drugs.  Allergies  Allergen Reactions  . Clindamycin/Lincomycin     Chest discomfort  . Sulfa Antibiotics Hives  . Sulfasalazine Hives    Family History  Adopted: Yes  Problem Relation Age of Onset  . Heart disease Mother   . Fibromyalgia Sister   . Hypertension Daughter   . Migraines Daughter   . Kidney disease Neg Hx   . Prostate cancer Neg Hx   . Bladder Cancer Neg Hx      Prior to Admission medications   Medication Sig Start Date End Date Taking? Authorizing Provider  albuterol (VENTOLIN HFA) 108 (90 Base) MCG/ACT inhaler Inhale 2 puffs into the lungs every 6 (six) hours as needed for wheezing or shortness of breath. 05/31/19  Yes Birdie Sons, MD  ferrous sulfate 325 (65 FE) MG tablet  Take 325 mg by mouth daily with breakfast.   Yes [provider]  lisinopril (ZESTRIL) 20 MG tablet Take 20 mg by mouth daily. 10/31/09  Yes [provider]  aspirin 81 MG EC tablet Take 81 mg by mouth every evening.    [provider]  furosemide (LASIX) 20 MG tablet Take 1 tablet by mouth once daily Patient taking differently: Take 60 mg by mouth daily.  06/18/19   Birdie Sons, MD  hydrocerin (EUCERIN) CREA Apply 1 application topically daily. 10/24/19   Nicole Kindred A, DO  insulin glargine (LANTUS) 100 UNIT/ML Solostar Pen Inject 10 Units into the skin at bedtime. 10/23/19   Ezekiel Slocumb, DO  ketoconazole (NIZORAL) 2 % cream Apply 1 application topically daily as needed for irritation. Patient not taking: Reported on 10/09/2019 08/06/19   Loletha Grayer, MD  lovastatin (MEVACOR) 20 MG tablet Take 1 tablet (20 mg total) by mouth every evening. 04/08/19   Birdie Sons, MD  metFORMIN (GLUCOPHAGE)  1000 MG tablet Take 1 tablet by mouth twice daily Patient taking differently: Take 500 mg by mouth 2 (two) times daily with a meal.  08/29/19   Fisher, Kirstie Peri, MD  metoprolol tartrate (LOPRESSOR) 50 MG tablet Take 1 tablet (50 mg total) by mouth 2 (two) times daily. 10/23/19   Ezekiel Slocumb, DO  Multiple Vitamin (MULTIVITAMIN WITH MINERALS) TABS tablet Take 1 tablet by mouth daily. 10/24/19   Ezekiel Slocumb, DO  Nystatin (GERHARDT'S BUTT CREAM) CREA Apply 1 application topically 3 (three) times daily. 10/23/19   Ezekiel Slocumb, DO  pantoprazole (PROTONIX) 40 MG tablet Take 1 tablet (40 mg total) by mouth 2 (two) times daily. 10/23/19 12/22/19  Ezekiel Slocumb, DO  simethicone (MYLICON) 80 MG chewable tablet Chew 1 tablet (80 mg total) by mouth every 6 (six) hours as needed for flatulence (or gas pains). 10/23/19   Ezekiel Slocumb, DO  traMADol-acetaminophen (ULTRACET) 37.5-325 MG tablet Take 1 tablet by mouth every 6 (six) hours as needed for severe pain.  08/06/19   Loletha Grayer, MD  traZODone (DESYREL) 50 MG tablet Take 1 tablet (50 mg total) by mouth at bedtime as needed for sleep. 10/23/19   Ezekiel Slocumb, DO  vancomycin (VANCOCIN) 125 MG capsule Take 1 capsule (125 mg total) by mouth 4 (four) times daily for 5 days. 10/23/19 10/28/19  Ezekiel Slocumb, DO    Physical Exam: Vitals:   10/28/19 0415 10/28/19 0530 10/28/19 0600 10/28/19 0615  BP: 117/74 112/65 98/69   Pulse: 82   77  Resp: 13 18 18 17   Temp: 97.8 F (36.6 C)     TempSrc: Oral     SpO2: 100%   100%  Weight:         Vitals:   10/28/19 0415 10/28/19 0530 10/28/19 0600 10/28/19 0615  BP: 117/74 112/65 98/69   Pulse: 82   77  Resp: 13 18 18 17   Temp: 97.8 F (36.6 C)     TempSrc: Oral     SpO2: 100%   100%  Weight:        Constitutional: NAD, alert and oriented x 2.  To person and place Eyes: PERRL, lids and conjunctivae pallor ENMT: Mucous membranes are moist.  Neck: normal, supple, no masses, no thyromegaly Respiratory: Bilateral air entry, no wheezing, no crackles. Normal respiratory effort. No accessory muscle use.  Cardiovascular: Irregularly irregular, no murmurs / rubs / gallops. 2+ extremity edema. 2+ pedal pulses. No carotid bruits.  Abdomen: no tenderness, no masses palpated. No hepatosplenomegaly. Bowel sounds positive.  Central adiposity Musculoskeletal: no clubbing / cyanosis.   Skin: no rashes, lesions, multiple lesions on bilateral lower extremities at different stages of healing, redness over left anterior tibia with no differential warmth Neurologic: No gross focal neurologic deficit.  Generalized weakness Psychiatric: Normal mood and affect.   Labs on Admission: I have personally reviewed following labs and imaging studies  CBC: Recent Labs  Lab 10/22/19 0608 10/23/19 0609 10/28/19 0417  WBC 7.3 6.9 7.7  NEUTROABS  --   --  5.7  HGB 8.8* 8.5* 9.5*  HCT 28.1* 27.5* 29.9*  MCV 99.3 98.9 96.8  PLT 286 250 588   Basic Metabolic  Panel: Recent Labs  Lab 10/22/19 0608 10/23/19 0609 10/28/19 0417  NA 135 134* 132*  K 4.3 4.1 4.5  CL 100 100 96*  CO2 28 27 24   GLUCOSE 152* 151* 119*  BUN 16 16 28*  CREATININE 1.41* 1.44*  3.85*  CALCIUM 8.1* 8.0* 8.3*  MG 2.2 2.1 2.2   GFR: Estimated Creatinine Clearance: 23.3 mL/min (A) (by C-G formula based on SCr of 3.85 mg/dL (H)). Liver Function Tests: Recent Labs  Lab 10/28/19 0417  AST 40  ALT 19  ALKPHOS 136*  BILITOT 1.4*  PROT 7.1  ALBUMIN 2.5*   No results for input(s): LIPASE, AMYLASE in the last 168 hours. Recent Labs  Lab 10/28/19 0417  AMMONIA 13   Coagulation Profile: Recent Labs  Lab 10/22/19 0608 10/23/19 0609 10/28/19 0417  INR 1.6* 1.7* 1.7*   Cardiac Enzymes: No results for input(s): CKTOTAL, CKMB, CKMBINDEX, TROPONINI in the last 168 hours. BNP (last 3 results) No results for input(s): PROBNP in the last 8760 hours. HbA1C: No results for input(s): HGBA1C in the last 72 hours. CBG: Recent Labs  Lab 10/22/19 1244 10/22/19 1743 10/22/19 2119 10/23/19 0728 10/23/19 1142  GLUCAP 219* 196* 176* 123* 136*   Lipid Profile: No results for input(s): CHOL, HDL, LDLCALC, TRIG, CHOLHDL, LDLDIRECT in the last 72 hours. Thyroid Function Tests: No results for input(s): TSH, T4TOTAL, FREET4, T3FREE, THYROIDAB in the last 72 hours. Anemia Panel: No results for input(s): VITAMINB12, FOLATE, FERRITIN, TIBC, IRON, RETICCTPCT in the last 72 hours. Urine analysis:    Component Value Date/Time   COLORURINE AMBER (A) 10/28/2019 0506   APPEARANCEUR HAZY (A) 10/28/2019 0506   APPEARANCEUR Cloudy (A) 08/20/2017 1309   LABSPEC 1.025 10/28/2019 0506   LABSPEC 1.041 09/14/2013 1658   PHURINE 5.0 10/28/2019 0506   GLUCOSEU NEGATIVE 10/28/2019 0506   GLUCOSEU 50 mg/dL 09/14/2013 1658   HGBUR NEGATIVE 10/28/2019 0506   BILIRUBINUR NEGATIVE 10/28/2019 0506   BILIRUBINUR negative 04/08/2019 1205   BILIRUBINUR Negative 08/20/2017 1309   BILIRUBINUR  Negative 09/14/2013 1658   KETONESUR NEGATIVE 10/28/2019 0506   PROTEINUR NEGATIVE 10/28/2019 0506   UROBILINOGEN 0.2 04/08/2019 1205   NITRITE NEGATIVE 10/28/2019 0506   LEUKOCYTESUR NEGATIVE 10/28/2019 0506   LEUKOCYTESUR Negative 09/14/2013 1658    Radiological Exams on Admission: CT Head Wo Contrast  Result Date: 10/28/2019 CLINICAL DATA:  Mental status changes. EXAM: CT HEAD WITHOUT CONTRAST TECHNIQUE: Contiguous axial images were obtained from the base of the skull through the vertex without intravenous contrast. COMPARISON:  08/08/2019 FINDINGS: Brain: Mild cerebral atrophy. Mild ventricular dilatation consistent with central atrophy. Low-attenuation changes in the deep white matter consistent with small vessel ischemia. Encephalomalacia in the right occipital lobe consistent with old infarct. No change in appearance since prior study. No mass-effect or midline shift. No abnormal extra-axial fluid collections. Basal cisterns are not effaced. No acute intracranial hemorrhage. Vascular: Intracranial arterial vascular calcifications are present. Skull: Calvarium appears intact. Sinuses/Orbits: Paranasal sinuses and mastoid air cells are clear. Other: None. IMPRESSION: 1. No acute intracranial abnormalities. 2. Chronic atrophy and small vessel ischemia. Old right occipital infarct. Electronically Signed   By: Lucienne Capers M.D.   On: 10/28/2019 04:49   DG Chest Portable 1 View  Result Date: 10/28/2019 CLINICAL DATA:  Altered mental status. EXAM: PORTABLE CHEST 1 VIEW COMPARISON:  10/16/2019 FINDINGS: Cardiac pacemaker. Shallow inspiration. Cardiac enlargement with central pulmonary vascular congestion. No definite edema or consolidation. No pleural effusions. No pneumothorax. Mediastinal contours appear intact. Calcification of the aorta. IMPRESSION: Cardiac enlargement with central pulmonary vascular congestion. Electronically Signed   By: Lucienne Capers M.D.   On: 10/28/2019 04:25     EKG: Independently reviewed.  Atrial fibrillation with low voltage QRS  Assessment/Plan Principal Problem:   AKI (  acute kidney injury) ([place]) Active Problems:   (HFpEF) heart failure with preserved ejection fraction (HCC)   Diabetes mellitus ([place])   Chronic atrial fibrillation (HCC)   C. difficile colitis   Generalized weakness   Obesity      Acute on chronic kidney injury Patient has a history of chronic kidney disease secondary to diabetes with a baseline serum creatinine of 1.4 but on admission his serum creatinine was 3.85 Worsening renal function may be secondary to overdiuresis Hold ACE inhibitors and diuretics Gentle IV fluid hydration Consult nephrology    Physical deconditioning Most likely following his recent prolonged hospitalization We will request physical therapy evaluation Place patient on fall precautions    Acute metabolic encephalopathy Patient's wife states that since his discharge home he has been very confused and somnolent  Mental status changes may be related to acute kidney injury Patient has a normal ammonia level Will obtain an arterial blood gas to rule out hypercapnia    Chronic diastolic dysfunction CHF Hold diuretics and ACE inhibitor for now due to acute kidney injury Continue metoprolol     Diabetes mellitus with complications of stage III chronic kidney disease Maintain consistent carbohydrate diet Continue long-acting insulin Place patient on sliding scale coverage with NovoLog    Chronic atrial fibrillation Continue rate control with metoprolol Patient was on warfarin as secondary prophylaxis for an acute stroke but this was discontinued on discharge due to acute blood loss anemia from a bleeding gastric ulcer We will defer to cardiology about resumption of anticoagulation therapy   Morbid obesity (BMI 74.7 kg/m2) Complicates overall prognosis and care    History of liver cirrhosis Secondary to [employer]  low-sodium diet Hold diuretic therapy for now     Hypertension Continue metoprolol    History of C. difficile colitis Patient does not have any further episodes of diarrhea Continue vancomycin 125 mg p.o. 4 times daily over the next 24 hours to complete a 10-day course of therapy    DVT prophylaxis: Lovenox Code Status: Full code Family Communication: Greater than 50% of time was spent discussing patient's condition and plan of care with his wife, [name] over the phone.  All questions and concerns have been addressed.  She she verbalizes understanding and agrees with the plan. Disposition Plan: Back to previous home environment Consults called: Nephrology    Collier Bullock MD [facility]     10/28/2019, 8:39 AM

## 2019-10-28 NOTE — ED Triage Notes (Signed)
Pt arrived via EMS from home after he awoke from sleep and told his wife he needed help. Pt typically able to speak in simple sentences was only grunting and moaning and speaking single words. Pt had recent admission to this hospital on 9/11 and was discharged on 9/26. Wife concerned he was discharged too early.

## 2019-10-28 NOTE — ED Notes (Signed)
Report called to floor RN

## 2019-10-28 NOTE — ED Notes (Addendum)
Pt in and out cathed per verbal order from EDP. This RN and Multimedia programmer bedside. Pt cleaned with provided wipes prior to procedure. Sterility kept thorughout procedure. 200 mL tea colored urine obtained. Sample obtained and sent to lab. Foreskin back in place.

## 2019-10-28 NOTE — ED Notes (Signed)
Pt wife had to leave said if anything changes to please call her at [phone] if no one answers she also has husbands cell which is [phone].

## 2019-10-29 ENCOUNTER — Encounter: Payer: Self-pay | Admitting: Internal Medicine

## 2019-10-29 ENCOUNTER — Inpatient Hospital Stay: Payer: Medicare HMO

## 2019-10-29 LAB — GLUCOSE, CAPILLARY
Glucose-Capillary: 69 mg/dL — ABNORMAL LOW (ref 70–99)
Glucose-Capillary: 111 mg/dL — ABNORMAL HIGH (ref 70–99)
Glucose-Capillary: 112 mg/dL — ABNORMAL HIGH (ref 70–99)
Glucose-Capillary: 93 mg/dL (ref 70–99)

## 2019-10-29 LAB — CBC
HCT: 27 % — ABNORMAL LOW (ref 39.0–52.0)
Hemoglobin: 9 g/dL — ABNORMAL LOW (ref 13.0–17.0)
MCH: 31.1 pg (ref 26.0–34.0)
MCHC: 33.3 g/dL (ref 30.0–36.0)
MCV: 93.4 fL (ref 80.0–100.0)
Platelets: 187 10*3/uL (ref 150–400)
RBC: 2.89 MIL/uL — ABNORMAL LOW (ref 4.22–5.81)
RDW: 19.9 % — ABNORMAL HIGH (ref 11.5–15.5)
WBC: 5.7 10*3/uL (ref 4.0–10.5)
nRBC: 0 % (ref 0.0–0.2)

## 2019-10-29 LAB — BASIC METABOLIC PANEL
Anion gap: 8 (ref 5–15)
BUN: 29 mg/dL — ABNORMAL HIGH (ref 8–23)
CO2: 24 mmol/L (ref 22–32)
Calcium: 8.5 mg/dL — ABNORMAL LOW (ref 8.9–10.3)
Chloride: 99 mmol/L (ref 98–111)
Creatinine, Ser: 3.08 mg/dL — ABNORMAL HIGH (ref 0.61–1.24)
GFR calc Af Amer: 23 mL/min — ABNORMAL LOW (ref >60)
GFR calc non Af Amer: 19 mL/min — ABNORMAL LOW (ref >60)
Glucose, Bld: 73 mg/dL (ref 70–99)
Potassium: 4.5 mmol/L (ref 3.5–5.1)
Sodium: 131 mmol/L — ABNORMAL LOW (ref 135–145)

## 2019-10-29 LAB — PROCALCITONIN: Procalcitonin: <0.1 ng/mL

## 2019-10-29 NOTE — Progress Notes (Signed)
Progress Note    Christian Berg  FHL:[id number] DOB: [birth year]  DOA: 10/28/2019 PCP: Birdie Sons, MD      Brief Narrative:    Medical records reviewed and are as summarized below:  Christian Berg is a 69 y.o. male with medical history significant for hypertension, diabetes mellitus with complications of stage III chronic kidney disease, chronic atrial fibrillation on Coumadin, history of congestive heart failure and disseminated superficial actinic porokeratosis.  He was recently discharged from the hospital on 10/23/2019 for acute on chronic diastolic CHF.  He said that he has been confused since he was discharged from the hospital.  He was found to have acute kidney injury.  He was treated with IV fluids but this has been held because of concern for fluid overload.  Nephrologist was consulted and he has been started on IV albumin for oncotic support.    Assessment/Plan:   Principal Problem:   AKI (acute kidney injury) ([place]) Active Problems:   (HFpEF) heart failure with preserved ejection fraction (HCC)   Diabetes mellitus ([place])   Chronic atrial fibrillation (HCC)   C. difficile colitis   Generalized weakness   Obesity   Body mass index is 44.25 kg/m.  (Morbid obesity)     Acute on chronic kidney injury stage IIIa Patient has a history of chronic kidney disease secondary to diabetes with a baseline serum creatinine of 1.4 but on admission his serum creatinine was 3.85 Lasix and lisinopril have been held.   Physical deconditioning Most likely following his recent prolonged hospitalization PT recommends discharge to SNF.  Follow-up with social worker to assist with disposition.   Acute metabolic encephalopathy-improved This is likely from acute kidney injury.    Chronic diastolic dysfunction CHF Hold diuretics and ACE inhibitor for now due to acute kidney injury Continue metoprolol     Type II diabetes mellitus with episode of  hypoglycemia today Discontinue Lantus because of renal dysfunction. NovoLog as needed for hyperglycemia. Monitor glucose levels closely.    Chronic atrial fibrillation Continue rate control with metoprolol Patient was on warfarin as secondary prophylaxis for an acute stroke but this was discontinued on recent discharge due to acute blood loss anemia from a bleeding gastric ulcer  Chronic anticoagulation will be addressed prior to discharge.   Morbid obesity (BMI 37.3 kg/m2) Complicates overall prognosis and care    History of liver cirrhosis Secondary to [employer] low-sodium diet Hold diuretic therapy for now     Hypertension Continue metoprolol    History of C. difficile colitis Patient does not have any further episodes of diarrhea Continue vancomycin 125 mg p.o. 4 times daily over the next 24 hours to complete a                      Diet Order            Diet Carb Modified Fluid consistency: Thin; Room service appropriate? Yes  Diet effective now                    Consultants:  Nephrologist  Procedures:  None    Medications:   . aspirin EC  81 mg Oral QPM  . enoxaparin (LOVENOX) injection  30 mg Subcutaneous Q24H  . ferrous sulfate  325 mg Oral Q breakfast  . insulin aspart  0-15 Units Subcutaneous TID WC  . insulin aspart  0-5 Units Subcutaneous QHS  . insulin glargine  10 Units Subcutaneous QHS  .  metoprolol tartrate  50 mg Oral BID  . midodrine  10 mg Oral TID WC  . multivitamin with minerals  1 tablet Oral Daily  . pantoprazole  40 mg Oral BID  . pravastatin  20 mg Oral q1800  . sodium chloride flush  3 mL Intravenous Q12H  . vancomycin  125 mg Oral QID   Continuous Infusions: . sodium chloride    . albumin human 12.5 g (10/29/19 0917)     Anti-infectives (From admission, onward)   Start     Dose/Rate Route Frequency Ordered Stop   10/28/19 1000  vancomycin (VANCOCIN) 50 mg/mL oral solution  125 mg        125 mg Oral 4 times daily 10/28/19 0910               Family Communication/Anticipated D/C date and plan/Code Status   DVT prophylaxis: enoxaparin (LOVENOX) injection 30 mg Start: 10/28/19 1000     Code Status: Full Code  Family Communication:  Disposition Plan:    Status is: Inpatient  Remains inpatient appropriate because:Inpatient level of care appropriate due to severity of illness   Dispo: The patient is from: Home              Anticipated d/c is to: SNF              Anticipated d/c date is: 2 days              Patient currently is not medically stable to d/c.           Subjective:   No shortness of breath or chest pain.  He has swelling in his legs at baseline.  Objective:    Vitals:   10/29/19 0419 10/29/19 0757 10/29/19 1157 10/29/19 1529  BP: 130/80 119/68 117/75 (!) 108/51  Pulse: 74 77 85 73  Resp: 17 20 19 17   Temp: 98 F (36.7 C) 98.6 F (37 C) 98.3 F (36.8 C) 98.3 F (36.8 C)  TempSrc: Oral Oral Oral Oral  SpO2: 95% 95% 99% 95%  Weight:      Height:       No data found.   Intake/Output Summary (Last 24 hours) at 10/29/2019 1749 Last data filed at 10/29/2019 0400 Gross per 24 hour  Intake 842.7 ml  Output 400 ml  Net 442.7 ml   Filed Weights   10/28/19 0347 10/28/19 1807 10/29/19 0300  Weight: 128.2 kg 131.6 kg 132 kg    Exam:  GEN: NAD SKIN: Multiple macular and plaque-like lesions on the trunk and extremities. EYES: EOMI ENT: MMM CV: RRR PULM: CTA B ABD: soft, obese/distended, NT, +BS CNS: AAO x 3, non focal EXT: Bilateral leg edema, no tenderness   Data Reviewed:   I have personally reviewed following labs and imaging studies:  Labs: Labs show the following:   Basic Metabolic Panel: Recent Labs  Lab 10/23/19 0609 10/23/19 0609 10/28/19 0417 10/29/19 0531  NA 134*  --  132* 131*  K 4.1   < > 4.5 4.5  CL 100  --  96* 99  CO2 27  --  24 24  GLUCOSE 151*  --  119* 73  BUN 16  --   28* 29*  CREATININE 1.44*  --  3.85* 3.08*  CALCIUM 8.0*  --  8.3* 8.5*  MG 2.1  --  2.2  --    < > = values in this interval not displayed.   GFR Estimated Creatinine Clearance: 29.6 mL/min (A) (  by C-G formula based on SCr of 3.08 mg/dL (H)). Liver Function Tests: Recent Labs  Lab 10/28/19 0417  AST 40  ALT 19  ALKPHOS 136*  BILITOT 1.4*  PROT 7.1  ALBUMIN 2.5*   No results for input(s): LIPASE, AMYLASE in the last 168 hours. Recent Labs  Lab 10/28/19 0417  AMMONIA 13   Coagulation profile Recent Labs  Lab 10/23/19 0609 10/28/19 0417  INR 1.7* 1.7*    CBC: Recent Labs  Lab 10/23/19 0609 10/28/19 0417 10/29/19 0531  WBC 6.9 7.7 5.7  NEUTROABS  --  5.7  --   HGB 8.5* 9.5* 9.0*  HCT 27.5* 29.9* 27.0*  MCV 98.9 96.8 93.4  PLT 250 232 187   Cardiac Enzymes: No results for input(s): CKTOTAL, CKMB, CKMBINDEX, TROPONINI in the last 168 hours. BNP (last 3 results) No results for input(s): PROBNP in the last 8760 hours. CBG: Recent Labs  Lab 10/28/19 1857 10/28/19 2024 10/29/19 0726 10/29/19 1158 10/29/19 1608  GLUCAP 83 84 69* 111* 112*   D-Dimer: No results for input(s): DDIMER in the last 72 hours. Hgb A1c: No results for input(s): HGBA1C in the last 72 hours. Lipid Profile: No results for input(s): CHOL, HDL, LDLCALC, TRIG, CHOLHDL, LDLDIRECT in the last 72 hours. Thyroid function studies: No results for input(s): TSH, T4TOTAL, T3FREE, THYROIDAB in the last 72 hours.  Invalid input(s): FREET3 Anemia work up: No results for input(s): VITAMINB12, FOLATE, FERRITIN, TIBC, IRON, RETICCTPCT in the last 72 hours. Sepsis Labs: Recent Labs  Lab 10/23/19 0609 10/28/19 0417 10/28/19 0530 10/29/19 0531  PROCALCITON  --  <0.10  --  <0.10  WBC 6.9 7.7  --  5.7  LATICACIDVEN  --  1.9 1.8  --     Microbiology Recent Results (from the past 240 hour(s))  Culture, blood (routine x 2)     Status: None (Preliminary result)   Collection Time: 10/28/19  4:17  AM   Specimen: BLOOD  Result Value Ref Range Status   Specimen Description BLOOD LEFT ANTECUBITAL  Final   Special Requests   Final    BOTTLES DRAWN AEROBIC AND ANAEROBIC Blood Culture adequate volume   Culture   Final    NO GROWTH 1 DAY Performed at [facility], [address], [place], [place] [address]    Report Status PENDING  Incomplete  Culture, blood (routine x 2)     Status: None (Preliminary result)   Collection Time: 10/28/19  4:17 AM   Specimen: BLOOD  Result Value Ref Range Status   Specimen Description BLOOD BLOOD RIGHT WRIST  Final   Special Requests   Final    BOTTLES DRAWN AEROBIC AND ANAEROBIC Blood Culture adequate volume   Culture   Final    NO GROWTH 1 DAY Performed at [facility], [address] [place], [place] [address]    Report Status PENDING  Incomplete  Respiratory Panel by RT PCR (Flu A&B, Covid) - Nasopharyngeal Swab     Status: None   Collection Time: 10/28/19  4:17 AM   Specimen: Nasopharyngeal Swab  Result Value Ref Range Status   SARS Coronavirus 2 by RT PCR NEGATIVE NEGATIVE Final    Comment: (NOTE) SARS-CoV-2 target nucleic acids are NOT DETECTED.  The SARS-CoV-2 RNA is generally detectable in upper respiratoy specimens during the acute phase of infection. The lowest concentration of SARS-CoV-2 viral copies this assay can detect is 131 copies/mL. A negative result does not preclude SARS-Cov-2 infection and should not be  used as the sole basis for treatment or other patient management decisions. A negative result may occur with  improper specimen collection/handling, submission of specimen other than nasopharyngeal swab, presence of viral mutation(s) within the areas targeted by this assay, and inadequate number of viral copies (<131 copies/mL). A negative result must be combined with clinical observations, patient history, and epidemiological information. The expected result is Negative.  Fact Sheet for Patients:    PinkCheek.be  Fact Sheet for Healthcare Providers:  GravelBags.it  This test is no t yet approved or cleared by the [place] FDA and  has been authorized for detection and/or diagnosis of SARS-CoV-2 by FDA under an Emergency Use Authorization (EUA). This EUA will remain  in effect (meaning this test can be used) for the duration of the COVID-19 declaration under Section 564(b)(1) of the Act, 21 U.S.C. section 360bbb-3(b)(1), unless the authorization is terminated or revoked sooner.     Influenza A by PCR NEGATIVE NEGATIVE Final   Influenza B by PCR NEGATIVE NEGATIVE Final    Comment: (NOTE) The Xpert Xpress SARS-CoV-2/FLU/RSV assay is intended as an aid in  the diagnosis of influenza from Nasopharyngeal swab specimens and  should not be used as a sole basis for treatment. Nasal washings and  aspirates are unacceptable for Xpert Xpress SARS-CoV-2/FLU/RSV  testing.  Fact Sheet for Patients: PinkCheek.be  Fact Sheet for Healthcare Providers: GravelBags.it  This test is not yet approved or cleared by the [place] FDA and  has been authorized for detection and/or diagnosis of SARS-CoV-2 by  FDA under an Emergency Use Authorization (EUA). This EUA will remain  in effect (meaning this test can be used) for the duration of the  Covid-19 declaration under Section 564(b)(1) of the Act, 21  U.S.C. section 360bbb-3(b)(1), unless the authorization is  terminated or revoked. Performed at [facility], [address], [place],  [address]     Procedures and diagnostic studies:  CT Head Wo Contrast  Result Date: 10/28/2019 CLINICAL DATA:  Mental status changes. EXAM: CT HEAD WITHOUT CONTRAST TECHNIQUE: Contiguous axial images were obtained from the base of the skull through the vertex without intravenous contrast. COMPARISON:  08/08/2019 FINDINGS:  Brain: Mild cerebral atrophy. Mild ventricular dilatation consistent with central atrophy. Low-attenuation changes in the deep white matter consistent with small vessel ischemia. Encephalomalacia in the right occipital lobe consistent with old infarct. No change in appearance since prior study. No mass-effect or midline shift. No abnormal extra-axial fluid collections. Basal cisterns are not effaced. No acute intracranial hemorrhage. Vascular: Intracranial arterial vascular calcifications are present. Skull: Calvarium appears intact. Sinuses/Orbits: Paranasal sinuses and mastoid air cells are clear. Other: None. IMPRESSION: 1. No acute intracranial abnormalities. 2. Chronic atrophy and small vessel ischemia. Old right occipital infarct. Electronically Signed   By: Lucienne Capers M.D.   On: 10/28/2019 04:49   DG Chest Portable 1 View  Result Date: 10/28/2019 CLINICAL DATA:  Altered mental status. EXAM: PORTABLE CHEST 1 VIEW COMPARISON:  10/16/2019 FINDINGS: Cardiac pacemaker. Shallow inspiration. Cardiac enlargement with central pulmonary vascular congestion. No definite edema or consolidation. No pleural effusions. No pneumothorax. Mediastinal contours appear intact. Calcification of the aorta. IMPRESSION: Cardiac enlargement with central pulmonary vascular congestion. Electronically Signed   By: Lucienne Capers M.D.   On: 10/28/2019 04:25               LOS: 1 day   Kashonda Sarkisyan  [facility]   Pager on [email or web address]. If 7PM-7AM, please contact night-coverage at  [email or web address]     10/29/2019, 5:49 PM

## 2019-10-29 NOTE — Progress Notes (Signed)
[facility]  ROUNDING NOTE   Subjective:  Patient resting comfortably in bed. Renal function has improved today. Creatinine down to 3.08.   Objective:  Vital signs in last 24 hours:  Temp:  [98 F (36.7 C)-98.7 F (37.1 C)] 98.3 F (36.8 C) (10/02 1157) Pulse Rate:  [66-85] 85 (10/02 1157) Resp:  [14-20] 19 (10/02 1157) BP: (76-130)/(56-87) 117/75 (10/02 1157) SpO2:  [94 %-100 %] 99 % (10/02 1157) Weight:  [131.6 kg-132 kg] 132 kg (10/02 0300)  Weight change: 3.4 kg Filed Weights   10/28/19 0347 10/28/19 1807 10/29/19 0300  Weight: 128.2 kg 131.6 kg 132 kg    Intake/Output: I/O last 3 completed shifts: In: 842.7 [I.V.:805.3; IV Piggyback:37.5] Out: 400 [Urine:400]   Intake/Output this shift:  No intake/output data recorded.  Physical Exam: General:  No acute distress  Head:  Normocephalic, atraumatic. Moist oral mucosal membranes  Eyes:  Anicteric  Neck:  Supple  Lungs:   Clear to auscultation, normal effort  Heart:  S1S2 no rubs  Abdomen:   Soft, nontender, distention noted  Extremities:  3+ peripheral edema.  Neurologic:  Awake, alert, following commands  Skin:  No acute rash       Basic Metabolic Panel: Recent Labs  Lab 10/23/19 0609 10/28/19 0417 10/29/19 0531  NA 134* 132* 131*  K 4.1 4.5 4.5  CL 100 96* 99  CO2 27 24 24   GLUCOSE 151* 119* 73  BUN 16 28* 29*  CREATININE 1.44* 3.85* 3.08*  CALCIUM 8.0* 8.3* 8.5*  MG 2.1 2.2  --     Liver Function Tests: Recent Labs  Lab 10/28/19 0417  AST 40  ALT 19  ALKPHOS 136*  BILITOT 1.4*  PROT 7.1  ALBUMIN 2.5*   No results for input(s): LIPASE, AMYLASE in the last 168 hours. Recent Labs  Lab 10/28/19 0417  AMMONIA 13    CBC: Recent Labs  Lab 10/23/19 0609 10/28/19 0417 10/29/19 0531  WBC 6.9 7.7 5.7  NEUTROABS  --  5.7  --   HGB 8.5* 9.5* 9.0*  HCT 27.5* 29.9* 27.0*  MCV 98.9 96.8 93.4  PLT 250 232 187    Cardiac Enzymes: No results for input(s): CKTOTAL, CKMB,  CKMBINDEX, TROPONINI in the last 168 hours.  BNP: Invalid input(s): POCBNP  CBG: Recent Labs  Lab 10/28/19 1401 10/28/19 1857 10/28/19 2024 10/29/19 0726 10/29/19 1158  GLUCAP 102* 83 84 69* 111*    Microbiology: Results for orders placed or performed during the hospital encounter of 10/28/19  Culture, blood (routine x 2)     Status: None (Preliminary result)   Collection Time: 10/28/19  4:17 AM   Specimen: BLOOD  Result Value Ref Range Status   Specimen Description BLOOD LEFT ANTECUBITAL  Final   Special Requests   Final    BOTTLES DRAWN AEROBIC AND ANAEROBIC Blood Culture adequate volume   Culture   Final    NO GROWTH 1 DAY Performed at [facility], [address] [place], [place] [address]    Report Status PENDING  Incomplete  Culture, blood (routine x 2)     Status: None (Preliminary result)   Collection Time: 10/28/19  4:17 AM   Specimen: BLOOD  Result Value Ref Range Status   Specimen Description BLOOD BLOOD RIGHT WRIST  Final   Special Requests   Final    BOTTLES DRAWN AEROBIC AND ANAEROBIC Blood Culture adequate volume   Culture   Final    NO GROWTH 1 DAY Performed  at [facility], [address] [place], [place] [address]    Report Status PENDING  Incomplete  Respiratory Panel by RT PCR (Flu A&B, Covid) - Nasopharyngeal Swab     Status: None   Collection Time: 10/28/19  4:17 AM   Specimen: Nasopharyngeal Swab  Result Value Ref Range Status   SARS Coronavirus 2 by RT PCR NEGATIVE NEGATIVE Final    Comment: (NOTE) SARS-CoV-2 target nucleic acids are NOT DETECTED.  The SARS-CoV-2 RNA is generally detectable in upper respiratoy specimens during the acute phase of infection. The lowest concentration of SARS-CoV-2 viral copies this assay can detect is 131 copies/mL. A negative result does not preclude SARS-Cov-2 infection and should not be used as the sole basis for treatment or other patient management decisions. A negative result may  occur with  improper specimen collection/handling, submission of specimen other than nasopharyngeal swab, presence of viral mutation(s) within the areas targeted by this assay, and inadequate number of viral copies (<131 copies/mL). A negative result must be combined with clinical observations, patient history, and epidemiological information. The expected result is Negative.  Fact Sheet for Patients:  PinkCheek.be  Fact Sheet for Healthcare Providers:  GravelBags.it  This test is no t yet approved or cleared by the [place] FDA and  has been authorized for detection and/or diagnosis of SARS-CoV-2 by FDA under an Emergency Use Authorization (EUA). This EUA will remain  in effect (meaning this test can be used) for the duration of the COVID-19 declaration under Section 564(b)(1) of the Act, 21 U.S.C. section 360bbb-3(b)(1), unless the authorization is terminated or revoked sooner.     Influenza A by PCR NEGATIVE NEGATIVE Final   Influenza B by PCR NEGATIVE NEGATIVE Final    Comment: (NOTE) The Xpert Xpress SARS-CoV-2/FLU/RSV assay is intended as an aid in  the diagnosis of influenza from Nasopharyngeal swab specimens and  should not be used as a sole basis for treatment. Nasal washings and  aspirates are unacceptable for Xpert Xpress SARS-CoV-2/FLU/RSV  testing.  Fact Sheet for Patients: PinkCheek.be  Fact Sheet for Healthcare Providers: GravelBags.it  This test is not yet approved or cleared by the [place] FDA and  has been authorized for detection and/or diagnosis of SARS-CoV-2 by  FDA under an Emergency Use Authorization (EUA). This EUA will remain  in effect (meaning this test can be used) for the duration of the  Covid-19 declaration under Section 564(b)(1) of the Act, 21  U.S.C. section 360bbb-3(b)(1), unless the authorization is  terminated or  revoked. Performed at [facility], [address] [place], [place] [address]     Coagulation Studies: Recent Labs    10/28/19 0417  LABPROT 19.1*  INR 1.7*    Urinalysis: Recent Labs    10/28/19 0506  COLORURINE AMBER*  LABSPEC 1.025  PHURINE 5.0  GLUCOSEU NEGATIVE  HGBUR NEGATIVE  BILIRUBINUR NEGATIVE  KETONESUR NEGATIVE  PROTEINUR NEGATIVE  NITRITE NEGATIVE  LEUKOCYTESUR NEGATIVE      Imaging: CT Head Wo Contrast  Result Date: 10/28/2019 CLINICAL DATA:  Mental status changes. EXAM: CT HEAD WITHOUT CONTRAST TECHNIQUE: Contiguous axial images were obtained from the base of the skull through the vertex without intravenous contrast. COMPARISON:  08/08/2019 FINDINGS: Brain: Mild cerebral atrophy. Mild ventricular dilatation consistent with central atrophy. Low-attenuation changes in the deep white matter consistent with small vessel ischemia. Encephalomalacia in the right occipital lobe consistent with old infarct. No change in appearance since prior study. No mass-effect or midline shift. No  abnormal extra-axial fluid collections. Basal cisterns are not effaced. No acute intracranial hemorrhage. Vascular: Intracranial arterial vascular calcifications are present. Skull: Calvarium appears intact. Sinuses/Orbits: Paranasal sinuses and mastoid air cells are clear. Other: None. IMPRESSION: 1. No acute intracranial abnormalities. 2. Chronic atrophy and small vessel ischemia. Old right occipital infarct. Electronically Signed   By: Lucienne Capers M.D.   On: 10/28/2019 04:49   DG Chest Portable 1 View  Result Date: 10/28/2019 CLINICAL DATA:  Altered mental status. EXAM: PORTABLE CHEST 1 VIEW COMPARISON:  10/16/2019 FINDINGS: Cardiac pacemaker. Shallow inspiration. Cardiac enlargement with central pulmonary vascular congestion. No definite edema or consolidation. No pleural effusions. No pneumothorax. Mediastinal contours appear intact. Calcification of the aorta.  IMPRESSION: Cardiac enlargement with central pulmonary vascular congestion. Electronically Signed   By: Lucienne Capers M.D.   On: 10/28/2019 04:25     Medications:   . sodium chloride    . albumin human 12.5 g (10/29/19 0917)   . aspirin EC  81 mg Oral QPM  . enoxaparin (LOVENOX) injection  30 mg Subcutaneous Q24H  . ferrous sulfate  325 mg Oral Q breakfast  . insulin aspart  0-15 Units Subcutaneous TID WC  . insulin aspart  0-5 Units Subcutaneous QHS  . insulin glargine  10 Units Subcutaneous QHS  . metoprolol tartrate  50 mg Oral BID  . midodrine  10 mg Oral TID WC  . multivitamin with minerals  1 tablet Oral Daily  . pantoprazole  40 mg Oral BID  . pravastatin  20 mg Oral q1800  . sodium chloride flush  3 mL Intravenous Q12H  . vancomycin  125 mg Oral QID   sodium chloride, albuterol, ondansetron **OR** ondansetron (ZOFRAN) IV, simethicone, sodium chloride flush  Assessment/ Plan:  68 y.o. male with diabetes, hypertension, BPH atrial fibrillation, morbid obesity, Karlene Lineman, cirrhosis of the liver, chronic kidney disease, diabetic nephropathy, congestive heart failure, chronic brain atrophy, small vessel ischemia, old right occipital infarct, leg wounds, hospitalization for C. difficile in September 2021, was admitted on 10/28/2019 with Generalized weakness [R53.1]   1.  Acute kidney injury/chronic kidney disease stage IIIa.  Baseline creatinine 1.4, EGFR 49.  Suspect intravascular volume depletion.  Patient's p.o. intake at home was low.  Renal function has improved.  Continue albumin for volume support.  Was on IVFs.   2.  Lower extremity edema.  Diuretics currently on hold.  3.  We will continue to monitor progress.   LOS: 1 Mouhamad Teed 10/2/20212:32 PM

## 2019-10-29 NOTE — Plan of Care (Signed)

## 2019-10-29 NOTE — Evaluation (Signed)
Physical Therapy Evaluation Patient Details Name: Christian Berg MRN: [id number] DOB: 1950-12-23 Today's Date: 10/29/2019   History of Present Illness  Christian Berg is a 69 y.o. male with medical history significant for hypertension, diabetes mellitus with complications of stage III chronic kidney disease, chronic atrial fibrillation on Coumadin, history of congestive heart failure and disseminated superficial actinic porokeratosis who presents to the ER by EMS 10/28/2019 for evaluation of mental status changes, increased somnolence, generalized weakness and inability to ambulate. Patient was recently discharged from the hospital following a prolonged hospitalization for acute on chronic diastolic dysfunction CHF.  His hospital course was complicated by acute blood loss anemia from a GI source.  Patient was transfused 1 unit of packed RBC and had an EGD which showed an actively bleeding duodenal ulcer.  He also developed C. difficile diarrhea and was started on vancomycin which she was discharged home on. Pateint admitted for acute kidney injury on CKD IIIa, acute metabolic encephalopathy, physical deconditioning, volume overload, hypoalbuminemia, and recent C. diff.    Clinical Impression  Patient appears mostly alert and oriented but does seem to have some difficulty recalling recent history. Wife came at end of session and provided some additional details. Patient lives in one story home with wife. She states she can use a w/c in the hallways but is unable to get through the bedroom door or into the bathroom. Patient has been hospitalized recently, but prior to that he was able to ambulate in the home and short community distances with RW or quad cane. Upon PT evaluation, patient is repeatedly incontinent of urine and stool and is dependent on PT to provide pericare and requires assistance with changing soiled clothing and linen. Patient required min A for bed mobility and CGA - close supervision for  sit <> stand transfers with RW. Was able to ambulate up to 30 feet with RW with close chair follow before becoming too weak to continue. Vitals remained WFL although he was in afib consistent with his history. Extra time was needed to repeatedly assist patient with clean up of incontinence in order to continued. Patient appears to have limited functional independence and mobility and would benefit from short term rehabilitation prior to returning home. He is back to the hospital now after previous unsuccessful attempt to return directly home, although rehab was recommended at that time as well. Patient would benefit from skilled physical therapy to address  impairments and functional limitations (see PT Problem List below) to work towards stated goals and return to PLOF or maximal functional independence.       Follow Up Recommendations SNF    Equipment Recommendations       Recommendations for Other Services       Precautions / Restrictions Precautions Precautions: Fall Restrictions Weight Bearing Restrictions: No      Mobility  Bed Mobility Overal bed mobility: Needs Assistance Bed Mobility: Supine to Sit     Supine to sit: HOB elevated;Min assist     General bed mobility comments: extended time and multiple attempts to get to edge of bed related to weakness and pain at lower abdomen. Became incontient of urine and stool when attempting to come to edge of bed.  Transfers Overall transfer level: Needs assistance Equipment used: Rolling walker (2 wheeled) Transfers: Sit to/from Stand Sit to Stand: Min guard;Supervision         General transfer comment: Pateint completed multiple sit <> stand from edge of bed and armed chair with CGA -  close supervision to RW. Requires cuing to push off/reach back with UE and tends to only hold the RW.  Ambulation/Gait Ambulation/Gait assistance: Min guard Gait Distance (Feet): 30 Feet (15 with seated rest; 30+10 with standing  rest) Assistive device: Rolling walker (2 wheeled) Gait Pattern/deviations: Step-through pattern;Trunk flexed Gait velocity: decreased   General Gait Details: decreased B LE step length/foot clearance; increased UE support on RW  Stairs            Wheelchair Mobility    Modified Rankin (Stroke Patients Only)       Balance Overall balance assessment: Needs assistance Sitting-balance support: No upper extremity supported;Feet supported Sitting balance-Leahy Scale: Good Sitting balance - Comments: steady sitting reaching within BOS   Standing balance support: Bilateral upper extremity supported Standing balance-Leahy Scale: Fair Standing balance comment: increased UE support required on RW with standing activities, was able to take one hand off RW momentarily.                             Pertinent Vitals/Pain Pain Assessment: Faces Faces Pain Scale: Hurts whole lot Pain Location: lower abdomen when attempting to get to edge of bed Pain Descriptors / Indicators: Grimacing;Guarding;Moaning Pain Intervention(s): Limited activity within patient's tolerance;Monitored during session;Repositioned    Home Living Family/patient expects to be discharged to:: Private residence Living Arrangements: Spouse/significant other Available Help at Discharge: Family Type of Home: House Home Access: Stairs to enter Entrance Stairs-Rails: Left Entrance Stairs-Number of Steps: 3 (from garage) Home Layout: One level Home Equipment: Grab bars - tub/shower;Walker - 4 wheels;Cane - quad;Bedside commode;Wheelchair - manual Additional Comments: reports he has slept in lift chair since 2008 due to back pain. Reports railings along the wall. Handicapped toilet. Wife states she cannot get a w/c into the bathroom or his bedroom.    Prior Function Level of Independence: Independent with assistive device(s)         Comments: Uses quad cane mostly in house but will use RW if doing a lot  of walking.  prior documentation states 1 fall about 5 months ago (passed out?). Wife states he fell twice since 10/23/19 but he doesn't appear to remember this     Hand Dominance   Dominant Hand: Right    Extremity/Trunk Assessment   Upper Extremity Assessment Upper Extremity Assessment: Generalized weakness    Lower Extremity Assessment Lower Extremity Assessment: Generalized weakness    Cervical / Trunk Assessment Cervical / Trunk Assessment:  (forward head/shoulders)  Communication   Communication: No difficulties  Cognition Arousal/Alertness: Awake/alert Behavior During Therapy: WFL for tasks assessed/performed Overall Cognitive Status: Within Functional Limits for tasks assessed                                 General Comments: answers all orientation questions reasonably correctly, jokes, a bit of difficulty recalling home and recent history. Wife arrived at end of session and reported he has fallen twice since going home from hospital on 9/26.      General Comments General comments (skin integrity, edema, etc.): VS monitored throughout session and remained WFL, frequently in afib    Exercises Other Exercises Other Exercises: Patient incontinent of urine and stool several times throughout session requiring stopping to clean him up and change gown. Patient practiced additional sit <> stand, standing and seated balance while performing these activities. Dependent on PT for pericare. Other Exercises: educated  pt and wife on D/C reccomendations and purpose of PT in acute care setting   Assessment/Plan    PT Assessment Patient needs continued PT services  PT Problem List Decreased strength;Decreased activity tolerance;Decreased balance;Decreased mobility;Cardiopulmonary status limiting activity;Decreased cognition;Decreased knowledge of use of DME;Decreased safety awareness;Decreased skin integrity;Obesity;Pain;Decreased range of motion       PT Treatment  Interventions DME instruction;Gait training;Stair training;Functional mobility training;Therapeutic activities;Therapeutic exercise;Balance training;Patient/family education;Neuromuscular re-education;Cognitive remediation    PT Goals (Current goals can be found in the Care Plan section)  Acute Rehab PT Goals Patient Stated Goal: go home and not go to rehab PT Goal Formulation: With patient/family Time For Goal Achievement: 11/12/19 Potential to Achieve Goals: Fair    Frequency Min 2X/week   Barriers to discharge Decreased caregiver support;Inaccessible home environment patient unable to get into bedroom or bathroom with w/c according to wife. wife is unable to provide physical assist    Co-evaluation               AM-PAC PT "6 Clicks" Mobility  Outcome Measure Help needed turning from your back to your side while in a flat bed without using bedrails?: A Little Help needed moving from lying on your back to sitting on the side of a flat bed without using bedrails?: A Little Help needed moving to and from a bed to a chair (including a wheelchair)?: A Little Help needed standing up from a chair using your arms (e.g., wheelchair or bedside chair)?: A Little Help needed to walk in hospital room?: A Little Help needed climbing 3-5 steps with a railing? : A Lot 6 Click Score: 17    End of Session Equipment Utilized During Treatment: Gait belt Activity Tolerance: Patient limited by fatigue;Patient tolerated treatment well;Other (comment) (limited by incontinence) Patient left: in chair;with call bell/phone within reach;with chair alarm set;with family/visitor present Nurse Communication: Mobility status;Precautions;Other (comment) (broken skin on R knee) PT Visit Diagnosis: Other abnormalities of gait and mobility (R26.89);Muscle weakness (generalized) (M62.81);History of falling (Z91.81)    Time: 2694-8546 PT Time Calculation (min) (ACUTE ONLY): 70 min   Charges:   PT  Evaluation $PT Eval Moderate Complexity: 1 Mod PT Treatments $Gait Training: 8-22 mins $Therapeutic Activity: 23-37 mins        Everlean Alstrom. Graylon Good, PT, DPT 10/29/19, 12:23 PM

## 2019-10-30 LAB — BASIC METABOLIC PANEL
Anion gap: 10 (ref 5–15)
BUN: 28 mg/dL — ABNORMAL HIGH (ref 8–23)
CO2: 21 mmol/L — ABNORMAL LOW (ref 22–32)
Calcium: 8.2 mg/dL — ABNORMAL LOW (ref 8.9–10.3)
Chloride: 103 mmol/L (ref 98–111)
Creatinine, Ser: 2.58 mg/dL — ABNORMAL HIGH (ref 0.61–1.24)
GFR calc Af Amer: 28 mL/min — ABNORMAL LOW (ref >60)
GFR calc non Af Amer: 24 mL/min — ABNORMAL LOW (ref >60)
Glucose, Bld: 110 mg/dL — ABNORMAL HIGH (ref 70–99)
Potassium: 4.7 mmol/L (ref 3.5–5.1)
Sodium: 134 mmol/L — ABNORMAL LOW (ref 135–145)

## 2019-10-30 LAB — CBC WITH DIFFERENTIAL/PLATELET
Abs Immature Granulocytes: 0.02 10*3/uL (ref 0.00–0.07)
Basophils Absolute: 0.1 10*3/uL (ref 0.0–0.1)
Basophils Relative: 1 %
Eosinophils Absolute: 0.2 10*3/uL (ref 0.0–0.5)
Eosinophils Relative: 4 %
HCT: 27.4 % — ABNORMAL LOW (ref 39.0–52.0)
Hemoglobin: 9 g/dL — ABNORMAL LOW (ref 13.0–17.0)
Immature Granulocytes: 0 %
Lymphocytes Relative: 17 %
Lymphs Abs: 1 10*3/uL (ref 0.7–4.0)
MCH: 31 pg (ref 26.0–34.0)
MCHC: 32.8 g/dL (ref 30.0–36.0)
MCV: 94.5 fL (ref 80.0–100.0)
Monocytes Absolute: 0.8 10*3/uL (ref 0.1–1.0)
Monocytes Relative: 13 %
Neutro Abs: 4 10*3/uL (ref 1.7–7.7)
Neutrophils Relative %: 65 %
Platelets: 189 10*3/uL (ref 150–400)
RBC: 2.9 MIL/uL — ABNORMAL LOW (ref 4.22–5.81)
RDW: 20.2 % — ABNORMAL HIGH (ref 11.5–15.5)
WBC: 6.1 10*3/uL (ref 4.0–10.5)
nRBC: 0 % (ref 0.0–0.2)

## 2019-10-30 LAB — GLUCOSE, CAPILLARY
Glucose-Capillary: 76 mg/dL (ref 70–99)
Glucose-Capillary: 84 mg/dL (ref 70–99)
Glucose-Capillary: 101 mg/dL — ABNORMAL HIGH (ref 70–99)
Glucose-Capillary: 89 mg/dL (ref 70–99)

## 2019-10-30 LAB — PROCALCITONIN: Procalcitonin: <0.1 ng/mL

## 2019-10-30 MED ORDER — ENOXAPARIN SODIUM 40 MG/0.4ML ~~LOC~~ SOLN
40.0000 mg | Freq: Two times a day (BID) | SUBCUTANEOUS | Status: DC
  Administered 2019-10-30 – 2019-10-31 (×3): 40 mg via SUBCUTANEOUS
  Filled 2019-10-30 (×3): qty 0.4

## 2019-10-30 MED ORDER — TRAMADOL HCL 50 MG PO TABS
50.0000 mg | ORAL_TABLET | Freq: Four times a day (QID) | ORAL | Status: DC | PRN
  Administered 2019-10-30 – 2019-10-31 (×2): 50 mg via ORAL
  Filled 2019-10-30 (×2): qty 1

## 2019-10-30 MED ORDER — MELATONIN 5 MG PO TABS
5.0000 mg | ORAL_TABLET | Freq: Every evening | ORAL | Status: DC | PRN
  Administered 2019-10-30: 5 mg via ORAL
  Filled 2019-10-30: qty 1

## 2019-10-30 MED ORDER — VANCOMYCIN 50 MG/ML ORAL SOLUTION
125.0000 mg | Freq: Four times a day (QID) | ORAL | Status: DC
  Administered 2019-10-30 – 2019-10-31 (×3): 125 mg via ORAL
  Filled 2019-10-30 (×2): qty 2.5

## 2019-10-30 NOTE — Progress Notes (Signed)
PHARMACIST - PHYSICIAN COMMUNICATION  CONCERNING:  Enoxaparin (Lovenox) for DVT Prophylaxis    RECOMMENDATION: Patient was prescribed enoxaprin 60m q24 hours for VTE prophylaxis ans CrCl <30 ml/min.   Filed Weights   10/28/19 0347 10/28/19 1807 10/29/19 0300  Weight: 128.2 kg (282 lb 10.1 oz) 131.6 kg (290 lb 2 oz) 132 kg (291 lb)    Body mass index is 44.25 kg/m.  Estimated Creatinine Clearance: 35.3 mL/min (A) (by C-G formula based on SCr of 2.58 mg/dL (H)).   CrCl now >30 ml/min. Based on CCovepatient is candidate for enoxaparin 469mevery 12 hours due to BMI being >40.   DESCRIPTION: Pharm[facility] has [facility]  Patient is now receiving enoxaparin 408mvery 12hours   ShePernell DupreharmD, BCPS Clinical Pharmacist 10/30/2019 8:28 AM

## 2019-10-30 NOTE — Progress Notes (Signed)
PROGRESS NOTE    Christian Berg  GSU:[id number] DOB: 1950-02-20 DOA: 10/28/2019 PCP: Birdie Sons, MD   Brief Narrative:  Christian Berg is a 69 y.o. male with medical history significant forhypertension, diabetes mellitus with complications of stage III chronic kidney disease, chronic atrial fibrillation on Coumadin, history of congestive heart failure and disseminated superficial actinic porokeratosis.  He was recently discharged from the hospital on 10/23/2019 for acute on chronic diastolic CHF.  He said that he has been confused since he was discharged from the hospital.  He was found to have acute kidney injury.  He was treated with IV fluids but this has been held because of concern for fluid overload.  Nephrologist was consulted and he has been started on IV albumin for oncotic support.  Subjective: Patient stating that I guess I am little better.  No new complaint. Denies any diarrhea.  No chest pain or shortness of breath.  Assessment & Plan:   Principal Problem:   AKI (acute kidney injury) ([place]) Active Problems:   (HFpEF) heart failure with preserved ejection fraction (HCC)   Diabetes mellitus ([place])   Chronic atrial fibrillation (HCC)   C. difficile colitis   Generalized weakness   Obesity  AKI with CKD stage IIIa.  Creatinine seems improving, Lasix and lisinopril has been held since admission.  Patient was started on IV albumin for oncotic support.  Nephrology was consulted-appreciate their help.  Baseline creatinine around 1.4, it was 2.58 today. -Nephrology planning to continue IV albumin. -Continue holding Lasix and lisinopril.  Chronic persistent atrial fibrillation.  Rate controlled. -Continue metoprolol. -Not a candidate for anticoagulation due to GI bleed.  Physical deconditioning.  PT evaluation was obtained and they were recommending SNF placement.  Spouse does not want him to go to SNF and requesting home health services. -TOC to get home  health.  Chronic diastolic heart failure.  Patient appears euvolemic.  Home dose of Lasix is being held due to worsening creatinine as he appears dry. -Continue metoprolol. -Keep holding Lasix and lisinopril.  Type 2 diabetes mellitus.  Lantus was discontinued due to an episode of hypoglycemia.  CBG within goal today. -Continue with SSI.  Hypertension. BP within goal today. -Continue metoprolol -Continue holding lasix and lisinopril   History of liver cirrhosis Secondary to [employer] low-sodium diet Hold diuretic therapy for now.  History of C. difficile colitis Patient does not have any further episodes of diarrhea Continue vancomycin 125 mg p.o. 4 times daily to complete the course.  Morbid obesity(BMI 85.9 kg/m2) Complicates overall prognosis and care.  Objective: Vitals:   10/30/19 0441 10/30/19 0752 10/30/19 1122 10/30/19 1530  BP: 120/66 (!) 105/59 116/71 110/79  Pulse: (!) 46 87 87 84  Resp: 20 18 18 16   Temp: 97.9 F (36.6 C) 98.1 F (36.7 C) 97.9 F (36.6 C) 98.1 F (36.7 C)  TempSrc:  Oral Oral Oral  SpO2: 98% 97% 93% 94%  Weight:      Height:        Intake/Output Summary (Last 24 hours) at 10/30/2019 1558 Last data filed at 10/30/2019 1445 Gross per 24 hour  Intake 240 ml  Output 300 ml  Net -60 ml   Filed Weights   10/28/19 0347 10/28/19 1807 10/29/19 0300  Weight: 128.2 kg 131.6 kg 132 kg    Examination:  General exam: Morbidly obese gentleman, appears calm and comfortable  Respiratory system: Clear to auscultation. Respiratory effort normal. Cardiovascular system: Irregularly irregular Gastrointestinal system: Soft, nontender,  nondistended, bowel sounds positive. Central nervous system: Alert and oriented. No focal neurological deficits. Extremities: No edema, no cyanosis, pulses intact and symmetrical. Skin: Multiple bilateral skin lesions. Psychiatry: Judgement and insight appear normal.    DVT prophylaxis: Lovenox Code Status:  Full Family Communication: Wife was updated on phone.  Disposition Plan:  Status is: Inpatient  Remains inpatient appropriate because:Inpatient level of care appropriate due to severity of illness   Dispo: The patient is from: Home              Anticipated d/c is to: Home              Anticipated d/c date is: 1 day              Patient currently is not medically stable to d/c.   Consultants:  Nephrology   Procedures:  Antimicrobials: P.o. vancomycin  Data Reviewed: I have personally reviewed following labs and imaging studies  CBC: Recent Labs  Lab 10/28/19 0417 10/29/19 0531 10/30/19 0501  WBC 7.7 5.7 6.1  NEUTROABS 5.7  --  4.0  HGB 9.5* 9.0* 9.0*  HCT 29.9* 27.0* 27.4*  MCV 96.8 93.4 94.5  PLT 232 187 536   Basic Metabolic Panel: Recent Labs  Lab 10/28/19 0417 10/29/19 0531 10/30/19 0501  NA 132* 131* 134*  K 4.5 4.5 4.7  CL 96* 99 103  CO2 24 24 21*  GLUCOSE 119* 73 110*  BUN 28* 29* 28*  CREATININE 3.85* 3.08* 2.58*  CALCIUM 8.3* 8.5* 8.2*  MG 2.2  --   --    GFR: Estimated Creatinine Clearance: 35.3 mL/min (A) (by C-G formula based on SCr of 2.58 mg/dL (H)). Liver Function Tests: Recent Labs  Lab 10/28/19 0417  AST 40  ALT 19  ALKPHOS 136*  BILITOT 1.4*  PROT 7.1  ALBUMIN 2.5*   No results for input(s): LIPASE, AMYLASE in the last 168 hours. Recent Labs  Lab 10/28/19 0417  AMMONIA 13   Coagulation Profile: Recent Labs  Lab 10/28/19 0417  INR 1.7*   Cardiac Enzymes: No results for input(s): CKTOTAL, CKMB, CKMBINDEX, TROPONINI in the last 168 hours. BNP (last 3 results) No results for input(s): PROBNP in the last 8760 hours. HbA1C: No results for input(s): HGBA1C in the last 72 hours. CBG: Recent Labs  Lab 10/29/19 1158 10/29/19 1608 10/29/19 2137 10/30/19 0752 10/30/19 1117  GLUCAP 111* 112* 93 76 84   Lipid Profile: No results for input(s): CHOL, HDL, LDLCALC, TRIG, CHOLHDL, LDLDIRECT in the last 72 hours. Thyroid  Function Tests: No results for input(s): TSH, T4TOTAL, FREET4, T3FREE, THYROIDAB in the last 72 hours. Anemia Panel: No results for input(s): VITAMINB12, FOLATE, FERRITIN, TIBC, IRON, RETICCTPCT in the last 72 hours. Sepsis Labs: Recent Labs  Lab 10/28/19 0417 10/28/19 0530 10/29/19 0531 10/30/19 0501  PROCALCITON <0.10  --  <0.10 <0.10  LATICACIDVEN 1.9 1.8  --   --     Recent Results (from the past 240 hour(s))  Culture, blood (routine x 2)     Status: None (Preliminary result)   Collection Time: 10/28/19  4:17 AM   Specimen: BLOOD  Result Value Ref Range Status   Specimen Description BLOOD LEFT ANTECUBITAL  Final   Special Requests   Final    BOTTLES DRAWN AEROBIC AND ANAEROBIC Blood Culture adequate volume   Culture   Final    NO GROWTH 2 DAYS Performed at [facility], [address] [place], [place] [address]    Report  Status PENDING  Incomplete  Culture, blood (routine x 2)     Status: None (Preliminary result)   Collection Time: 10/28/19  4:17 AM   Specimen: BLOOD  Result Value Ref Range Status   Specimen Description BLOOD BLOOD RIGHT WRIST  Final   Special Requests   Final    BOTTLES DRAWN AEROBIC AND ANAEROBIC Blood Culture adequate volume   Culture   Final    NO GROWTH 2 DAYS Performed at [facility], [address], [place], [place] [address]    Report Status PENDING  Incomplete  Respiratory Panel by RT PCR (Flu A&B, Covid) - Nasopharyngeal Swab     Status: None   Collection Time: 10/28/19  4:17 AM   Specimen: Nasopharyngeal Swab  Result Value Ref Range Status   SARS Coronavirus 2 by RT PCR NEGATIVE NEGATIVE Final    Comment: (NOTE) SARS-CoV-2 target nucleic acids are NOT DETECTED.  The SARS-CoV-2 RNA is generally detectable in upper respiratoy specimens during the acute phase of infection. The lowest concentration of SARS-CoV-2 viral copies this assay can detect is 131 copies/mL. A negative result does not preclude  SARS-Cov-2 infection and should not be used as the sole basis for treatment or other patient management decisions. A negative result may occur with  improper specimen collection/handling, submission of specimen other than nasopharyngeal swab, presence of viral mutation(s) within the areas targeted by this assay, and inadequate number of viral copies (<131 copies/mL). A negative result must be combined with clinical observations, patient history, and epidemiological information. The expected result is Negative.  Fact Sheet for Patients:  PinkCheek.be  Fact Sheet for Healthcare Providers:  GravelBags.it  This test is no t yet approved or cleared by the [place] FDA and  has been authorized for detection and/or diagnosis of SARS-CoV-2 by FDA under an Emergency Use Authorization (EUA). This EUA will remain  in effect (meaning this test can be used) for the duration of the COVID-19 declaration under Section 564(b)(1) of the Act, 21 U.S.C. section 360bbb-3(b)(1), unless the authorization is terminated or revoked sooner.     Influenza A by PCR NEGATIVE NEGATIVE Final   Influenza B by PCR NEGATIVE NEGATIVE Final    Comment: (NOTE) The Xpert Xpress SARS-CoV-2/FLU/RSV assay is intended as an aid in  the diagnosis of influenza from Nasopharyngeal swab specimens and  should not be used as a sole basis for treatment. Nasal washings and  aspirates are unacceptable for Xpert Xpress SARS-CoV-2/FLU/RSV  testing.  Fact Sheet for Patients: PinkCheek.be  Fact Sheet for Healthcare Providers: GravelBags.it  This test is not yet approved or cleared by the [place] FDA and  has been authorized for detection and/or diagnosis of SARS-CoV-2 by  FDA under an Emergency Use Authorization (EUA). This EUA will remain  in effect (meaning this test can be used) for the duration of the   Covid-19 declaration under Section 564(b)(1) of the Act, 21  U.S.C. section 360bbb-3(b)(1), unless the authorization is  terminated or revoked. Performed at [facility], [address] [place], [place] [address]      Radiology Studies: US RENAL  Result Date: 10/29/2019 CLINICAL DATA:  Acute renal failure. EXAM: RENAL / URINARY TRACT ULTRASOUND COMPLETE COMPARISON:  None. FINDINGS: Right Kidney: Renal measurements: 11.3 cm x 4.9 cm x 5.0 cm = volume: 147 mL. Echogenicity within normal limits. No mass or hydronephrosis visualized. Left Kidney: Renal measurements: 10.9 cm x 5.1 cm x 4.9 cm = volume: 142 mL. Echogenicity within normal  limits. No mass or hydronephrosis visualized. Bladder: Appears normal for degree of bladder distention. Other: None. IMPRESSION: Normal renal ultrasound. Electronically Signed   By: Virgina Norfolk M.D.   On: 10/29/2019 20:08    Scheduled Meds: . aspirin EC  81 mg Oral QPM  . enoxaparin (LOVENOX) injection  40 mg Subcutaneous Q12H  . ferrous sulfate  325 mg Oral Q breakfast  . insulin aspart  0-15 Units Subcutaneous TID WC  . insulin aspart  0-5 Units Subcutaneous QHS  . metoprolol tartrate  50 mg Oral BID  . midodrine  10 mg Oral TID WC  . multivitamin with minerals  1 tablet Oral Daily  . pantoprazole  40 mg Oral BID  . pravastatin  20 mg Oral q1800  . sodium chloride flush  3 mL Intravenous Q12H  . vancomycin  125 mg Oral QID   Continuous Infusions: . sodium chloride    . albumin human 12.5 g (10/30/19 1156)     LOS: 2 days   Time spent: 35 minutes.  Lorella Nimrod, MD [facility]  If 7PM-7AM, please contact night-coverage [email or web address]  10/30/2019, 3:58 PM   This record has been created using Systems analyst. Errors have been sought and corrected,but may not always be located. Such creation errors do not reflect on the standard of care.

## 2019-10-30 NOTE — Progress Notes (Signed)
[facility]  ROUNDING NOTE   Subjective:  Renal function has improved again today. Creatinine down to 2.5 with an EGFR of 24.  Serum sodium also up to 134.   Objective:  Vital signs in last 24 hours:  Temp:  [97.8 F (36.6 C)-98.3 F (36.8 C)] 97.9 F (36.6 C) (10/03 1122) Pulse Rate:  [46-87] 87 (10/03 1122) Resp:  [17-20] 18 (10/03 1122) BP: (105-127)/(51-95) 116/71 (10/03 1122) SpO2:  [69 %-99 %] 93 % (10/03 1122)  Weight change:  Filed Weights   10/28/19 0347 10/28/19 1807 10/29/19 0300  Weight: 128.2 kg 131.6 kg 132 kg    Intake/Output: I/O last 3 completed shifts: In: 37.5 [IV Piggyback:37.5] Out: 400 [Urine:400]   Intake/Output this shift:  Total I/O In: -  Out: 200 [Urine:200]  Physical Exam: General:  No acute distress  Head:  Normocephalic, atraumatic. Moist oral mucosal membranes  Eyes:  Anicteric  Neck:  Supple  Lungs:   Clear to auscultation, normal effort  Heart:  S1S2 no rubs  Abdomen:   Soft, nontender, distention noted  Extremities:  2+ peripheral edema.  Neurologic:  Awake, alert, following commands  Skin:  No acute rash       Basic Metabolic Panel: Recent Labs  Lab 10/28/19 0417 10/29/19 0531 10/30/19 0501  NA 132* 131* 134*  K 4.5 4.5 4.7  CL 96* 99 103  CO2 24 24 21*  GLUCOSE 119* 73 110*  BUN 28* 29* 28*  CREATININE 3.85* 3.08* 2.58*  CALCIUM 8.3* 8.5* 8.2*  MG 2.2  --   --     Liver Function Tests: Recent Labs  Lab 10/28/19 0417  AST 40  ALT 19  ALKPHOS 136*  BILITOT 1.4*  PROT 7.1  ALBUMIN 2.5*   No results for input(s): LIPASE, AMYLASE in the last 168 hours. Recent Labs  Lab 10/28/19 0417  AMMONIA 13    CBC: Recent Labs  Lab 10/28/19 0417 10/29/19 0531 10/30/19 0501  WBC 7.7 5.7 6.1  NEUTROABS 5.7  --  4.0  HGB 9.5* 9.0* 9.0*  HCT 29.9* 27.0* 27.4*  MCV 96.8 93.4 94.5  PLT 232 187 189    Cardiac Enzymes: No results for input(s): CKTOTAL, CKMB, CKMBINDEX, TROPONINI in the last 168  hours.  BNP: Invalid input(s): POCBNP  CBG: Recent Labs  Lab 10/29/19 1158 10/29/19 1608 10/29/19 2137 10/30/19 0752 10/30/19 1117  GLUCAP 111* 112* 93 76 84    Microbiology: Results for orders placed or performed during the hospital encounter of 10/28/19  Culture, blood (routine x 2)     Status: None (Preliminary result)   Collection Time: 10/28/19  4:17 AM   Specimen: BLOOD  Result Value Ref Range Status   Specimen Description BLOOD LEFT ANTECUBITAL  Final   Special Requests   Final    BOTTLES DRAWN AEROBIC AND ANAEROBIC Blood Culture adequate volume   Culture   Final    NO GROWTH 2 DAYS Performed at [facility], [address], [place], [place] [address]    Report Status PENDING  Incomplete  Culture, blood (routine x 2)     Status: None (Preliminary result)   Collection Time: 10/28/19  4:17 AM   Specimen: BLOOD  Result Value Ref Range Status   Specimen Description BLOOD BLOOD RIGHT WRIST  Final   Special Requests   Final    BOTTLES DRAWN AEROBIC AND ANAEROBIC Blood Culture adequate volume   Culture   Final    NO GROWTH 2 DAYS  Performed at [facility], [address] [place], [place] [address]    Report Status PENDING  Incomplete  Respiratory Panel by RT PCR (Flu A&B, Covid) - Nasopharyngeal Swab     Status: None   Collection Time: 10/28/19  4:17 AM   Specimen: Nasopharyngeal Swab  Result Value Ref Range Status   SARS Coronavirus 2 by RT PCR NEGATIVE NEGATIVE Final    Comment: (NOTE) SARS-CoV-2 target nucleic acids are NOT DETECTED.  The SARS-CoV-2 RNA is generally detectable in upper respiratoy specimens during the acute phase of infection. The lowest concentration of SARS-CoV-2 viral copies this assay can detect is 131 copies/mL. A negative result does not preclude SARS-Cov-2 infection and should not be used as the sole basis for treatment or other patient management decisions. A negative result may occur with  improper specimen  collection/handling, submission of specimen other than nasopharyngeal swab, presence of viral mutation(s) within the areas targeted by this assay, and inadequate number of viral copies (<131 copies/mL). A negative result must be combined with clinical observations, patient history, and epidemiological information. The expected result is Negative.  Fact Sheet for Patients:  PinkCheek.be  Fact Sheet for Healthcare Providers:  GravelBags.it  This test is no t yet approved or cleared by the [place] FDA and  has been authorized for detection and/or diagnosis of SARS-CoV-2 by FDA under an Emergency Use Authorization (EUA). This EUA will remain  in effect (meaning this test can be used) for the duration of the COVID-19 declaration under Section 564(b)(1) of the Act, 21 U.S.C. section 360bbb-3(b)(1), unless the authorization is terminated or revoked sooner.     Influenza A by PCR NEGATIVE NEGATIVE Final   Influenza B by PCR NEGATIVE NEGATIVE Final    Comment: (NOTE) The Xpert Xpress SARS-CoV-2/FLU/RSV assay is intended as an aid in  the diagnosis of influenza from Nasopharyngeal swab specimens and  should not be used as a sole basis for treatment. Nasal washings and  aspirates are unacceptable for Xpert Xpress SARS-CoV-2/FLU/RSV  testing.  Fact Sheet for Patients: PinkCheek.be  Fact Sheet for Healthcare Providers: GravelBags.it  This test is not yet approved or cleared by the [place] FDA and  has been authorized for detection and/or diagnosis of SARS-CoV-2 by  FDA under an Emergency Use Authorization (EUA). This EUA will remain  in effect (meaning this test can be used) for the duration of the  Covid-19 declaration under Section 564(b)(1) of the Act, 21  U.S.C. section 360bbb-3(b)(1), unless the authorization is  terminated or revoked. Performed at [facility], [address] [place], [place] [address]     Coagulation Studies: Recent Labs    10/28/19 0417  LABPROT 19.1*  INR 1.7*    Urinalysis: Recent Labs    10/28/19 0506  COLORURINE AMBER*  LABSPEC 1.025  PHURINE 5.0  GLUCOSEU NEGATIVE  HGBUR NEGATIVE  BILIRUBINUR NEGATIVE  KETONESUR NEGATIVE  PROTEINUR NEGATIVE  NITRITE NEGATIVE  LEUKOCYTESUR NEGATIVE      Imaging: US RENAL  Result Date: 10/29/2019 CLINICAL DATA:  Acute renal failure. EXAM: RENAL / URINARY TRACT ULTRASOUND COMPLETE COMPARISON:  None. FINDINGS: Right Kidney: Renal measurements: 11.3 cm x 4.9 cm x 5.0 cm = volume: 147 mL. Echogenicity within normal limits. No mass or hydronephrosis visualized. Left Kidney: Renal measurements: 10.9 cm x 5.1 cm x 4.9 cm = volume: 142 mL. Echogenicity within normal limits. No mass or hydronephrosis visualized. Bladder: Appears normal for degree of bladder distention. Other: None. IMPRESSION: Normal renal  ultrasound. Electronically Signed   By: Virgina Norfolk M.D.   On: 10/29/2019 20:08     Medications:   . sodium chloride    . albumin human 12.5 g (10/30/19 1156)   . aspirin EC  81 mg Oral QPM  . enoxaparin (LOVENOX) injection  40 mg Subcutaneous Q12H  . ferrous sulfate  325 mg Oral Q breakfast  . insulin aspart  0-15 Units Subcutaneous TID WC  . insulin aspart  0-5 Units Subcutaneous QHS  . metoprolol tartrate  50 mg Oral BID  . midodrine  10 mg Oral TID WC  . multivitamin with minerals  1 tablet Oral Daily  . pantoprazole  40 mg Oral BID  . pravastatin  20 mg Oral q1800  . sodium chloride flush  3 mL Intravenous Q12H  . vancomycin  125 mg Oral QID   sodium chloride, albuterol, ondansetron **OR** ondansetron (ZOFRAN) IV, simethicone, sodium chloride flush  Assessment/ Plan:  69 y.o. male with diabetes, hypertension, BPH atrial fibrillation, morbid obesity, Karlene Lineman, cirrhosis of the liver, chronic kidney disease, diabetic nephropathy, congestive heart  failure, chronic brain atrophy, small vessel ischemia, old right occipital infarct, leg wounds, hospitalization for C. difficile in September 2021, was admitted on 10/28/2019 with Generalized weakness [R53.1]   1.  Acute kidney injury/chronic kidney disease stage IIIa.  Baseline creatinine 1.4, EGFR 49.  Renal function continues to improve.  BUN down to 28 with a creatinine of 2.5.  Okay to maintain the patient on IV albumin for now.  2.  Lower extremity edema.  Diuretics currently on hold as it was felt that diuretics were leading to intravascular volume depletion.  3.  Hyponatremia.  Likely due to underlying liver disease.  Serum sodium up to 134.  Continue to monitor.   LOS: 2 Christian Berg 10/3/20211:42 PM

## 2019-10-30 NOTE — Plan of Care (Signed)
?  Problem: Clinical Measurements: ?Goal: Will remain free from infection ?Outcome: Progressing ?  ?

## 2019-10-30 NOTE — TOC Progression Note (Addendum)
Transition of Care [facility]) - Progression Note    Patient Details  Name: Christian Berg MRN: [id number] Date of Birth: [birth year]  Transition of Care [facility]) CM/SW Contact  Zigmund Daniel Dorian Pod, RN Phone Number:[phone] 10/30/2019, 12:01 PM  Clinical Narrative:     PT evaluation recommends SNF placement post discharge. RN attempted contact directly with pt and with his spouse (Doris)however unsuccessful. Look up on PASRR# [id number] (04/07/2017).    TOC team will continue to follow up according for placement options if indicated and to completed the initial assessment.   Addendum: Spouse (Doris) returned a call to [facility]. Discussed TOC services in preparing for pt's potential discharge plans. Informed spouse that currently the recommendations for PT at SNF however spouse declined SNF and has requested [place] for services. Called [facility] spoke with Corene Cornea requesting HHPT (pending).   TOC will continue to follow.      Expected Discharge Plan and Services                                                 Social Determinants of Health (SDOH) Interventions    Readmission Risk Interventions Readmission Risk Prevention Plan 10/12/2019  Transportation Screening Complete  PCP or Specialist Appt within 3-5 Days (No Data)  [place] or [place] Complete  Palliative Care Screening Not Applicable  Medication Review (RN Care Manager) Complete  Some recent data might be hidden

## 2019-10-31 LAB — BASIC METABOLIC PANEL
Anion gap: 9 (ref 5–15)
BUN: 27 mg/dL — ABNORMAL HIGH (ref 8–23)
CO2: 24 mmol/L (ref 22–32)
Calcium: 8.6 mg/dL — ABNORMAL LOW (ref 8.9–10.3)
Chloride: 103 mmol/L (ref 98–111)
Creatinine, Ser: 2.09 mg/dL — ABNORMAL HIGH (ref 0.61–1.24)
GFR calc Af Amer: 36 mL/min — ABNORMAL LOW (ref >60)
GFR calc non Af Amer: 31 mL/min — ABNORMAL LOW (ref >60)
Glucose, Bld: 91 mg/dL (ref 70–99)
Potassium: 4.5 mmol/L (ref 3.5–5.1)
Sodium: 136 mmol/L (ref 135–145)

## 2019-10-31 LAB — GLUCOSE, CAPILLARY
Glucose-Capillary: 79 mg/dL (ref 70–99)
Glucose-Capillary: 122 mg/dL — ABNORMAL HIGH (ref 70–99)
Glucose-Capillary: 98 mg/dL (ref 70–99)

## 2019-10-31 LAB — CBC
HCT: 27.5 % — ABNORMAL LOW (ref 39.0–52.0)
Hemoglobin: 9.3 g/dL — ABNORMAL LOW (ref 13.0–17.0)
MCH: 31.2 pg (ref 26.0–34.0)
MCHC: 33.8 g/dL (ref 30.0–36.0)
MCV: 92.3 fL (ref 80.0–100.0)
Platelets: 206 10*3/uL (ref 150–400)
RBC: 2.98 MIL/uL — ABNORMAL LOW (ref 4.22–5.81)
RDW: 20.2 % — ABNORMAL HIGH (ref 11.5–15.5)
WBC: 6.3 10*3/uL (ref 4.0–10.5)
nRBC: 0 % (ref 0.0–0.2)

## 2019-10-31 MED ORDER — ALUM & MAG HYDROXIDE-SIMETH 200-200-20 MG/5ML PO SUSP
30.0000 mL | Freq: Once | ORAL | Status: AC
  Administered 2019-10-31: 30 mL via ORAL
  Filled 2019-10-31: qty 30

## 2019-10-31 MED ORDER — MIDODRINE HCL 10 MG PO TABS
10.0000 mg | ORAL_TABLET | Freq: Three times a day (TID) | ORAL | 0 refills | Status: DC
Start: 2019-10-31 — End: 2020-02-15

## 2019-10-31 MED ORDER — LIDOCAINE VISCOUS HCL 2 % MT SOLN
15.0000 mL | Freq: Once | OROMUCOSAL | Status: AC
  Administered 2019-10-31: 15 mL via ORAL
  Filled 2019-10-31: qty 15

## 2019-10-31 NOTE — Progress Notes (Signed)
[facility]  ROUNDING NOTE   Subjective:   Patient lying in bed, in no acute distress. Denies SOB,nausea or vomiting.Patient is on IV Albumin for oncotic support.  Objective:  Vital signs in last 24 hours:  Temp:  [97.9 F (36.6 C)-98.9 F (37.2 C)] 97.9 F (36.6 C) (10/04 1129) Pulse Rate:  [78-96] 78 (10/04 1129) Resp:  [16-20] 18 (10/04 1129) BP: (94-119)/(53-79) 112/60 (10/04 1129) SpO2:  [93 %-98 %] 96 % (10/04 1129) Weight:  [131.8 kg] 131.8 kg (10/04 0509)  Weight change:  Filed Weights   10/28/19 1807 10/29/19 0300 10/31/19 0509  Weight: 131.6 kg 132 kg 131.8 kg    Intake/Output: I/O last 3 completed shifts: In: 240 [P.O.:240] Out: 300 [Urine:300]   Intake/Output this shift:  Total I/O In: 480 [P.O.:480] Out: 300 [Urine:300]  Physical Exam: General:  Resting in bed, calm and coopertaive  Head:   Moist oral mucosal membranes  Eyes:  Anicteric  Neck:  Supple  Lungs:    Normal and symmetrical  Effort,Lungs clear but diminished at the bases  Heart:  Irregular, S1S2 no rubs  Abdomen:   obese, distended  Extremities:  2+ peripheral edema.  Neurologic:  Awake, alert, speech clear and appropriate  Skin:  No acute lesions or rashes       Basic Metabolic Panel: Recent Labs  Lab 10/28/19 0417 10/28/19 0417 10/29/19 0531 10/30/19 0501 10/31/19 0449  NA 132*  --  131* 134* 136  K 4.5  --  4.5 4.7 4.5  CL 96*  --  99 103 103  CO2 24  --  24 21* 24  GLUCOSE 119*  --  73 110* 91  BUN 28*  --  29* 28* 27*  CREATININE 3.85*  --  3.08* 2.58* 2.09*  CALCIUM 8.3*   < > 8.5* 8.2* 8.6*  MG 2.2  --   --   --   --    < > = values in this interval not displayed.    Liver Function Tests: Recent Labs  Lab 10/28/19 0417  AST 40  ALT 19  ALKPHOS 136*  BILITOT 1.4*  PROT 7.1  ALBUMIN 2.5*   No results for input(s): LIPASE, AMYLASE in the last 168 hours. Recent Labs  Lab 10/28/19 0417  AMMONIA 13    CBC: Recent Labs  Lab 10/28/19 0417  10/29/19 0531 10/30/19 0501 10/31/19 0449  WBC 7.7 5.7 6.1 6.3  NEUTROABS 5.7  --  4.0  --   HGB 9.5* 9.0* 9.0* 9.3*  HCT 29.9* 27.0* 27.4* 27.5*  MCV 96.8 93.4 94.5 92.3  PLT 232 187 189 206    Cardiac Enzymes: No results for input(s): CKTOTAL, CKMB, CKMBINDEX, TROPONINI in the last 168 hours.  BNP: Invalid input(s): POCBNP  CBG: Recent Labs  Lab 10/30/19 1117 10/30/19 1634 10/30/19 2046 10/31/19 0758 10/31/19 1128  GLUCAP 84 101* 89 79 122*    Microbiology: Results for orders placed or performed during the hospital encounter of 10/28/19  Culture, blood (routine x 2)     Status: None (Preliminary result)   Collection Time: 10/28/19  4:17 AM   Specimen: BLOOD  Result Value Ref Range Status   Specimen Description BLOOD LEFT ANTECUBITAL  Final   Special Requests   Final    BOTTLES DRAWN AEROBIC AND ANAEROBIC Blood Culture adequate volume   Culture   Final    NO GROWTH 3 DAYS Performed at [facility], [address] [place], [place] [address]  Report Status PENDING  Incomplete  Culture, blood (routine x 2)     Status: None (Preliminary result)   Collection Time: 10/28/19  4:17 AM   Specimen: BLOOD  Result Value Ref Range Status   Specimen Description BLOOD BLOOD RIGHT WRIST  Final   Special Requests   Final    BOTTLES DRAWN AEROBIC AND ANAEROBIC Blood Culture adequate volume   Culture   Final    NO GROWTH 3 DAYS Performed at [facility], [address], [place], [place] [address]    Report Status PENDING  Incomplete  Respiratory Panel by RT PCR (Flu A&B, Covid) - Nasopharyngeal Swab     Status: None   Collection Time: 10/28/19  4:17 AM   Specimen: Nasopharyngeal Swab  Result Value Ref Range Status   SARS Coronavirus 2 by RT PCR NEGATIVE NEGATIVE Final    Comment: (NOTE) SARS-CoV-2 target nucleic acids are NOT DETECTED.  The SARS-CoV-2 RNA is generally detectable in upper respiratoy specimens during the acute phase of infection. The  lowest concentration of SARS-CoV-2 viral copies this assay can detect is 131 copies/mL. A negative result does not preclude SARS-Cov-2 infection and should not be used as the sole basis for treatment or other patient management decisions. A negative result may occur with  improper specimen collection/handling, submission of specimen other than nasopharyngeal swab, presence of viral mutation(s) within the areas targeted by this assay, and inadequate number of viral copies (<131 copies/mL). A negative result must be combined with clinical observations, patient history, and epidemiological information. The expected result is Negative.  Fact Sheet for Patients:  PinkCheek.be  Fact Sheet for Healthcare Providers:  GravelBags.it  This test is no t yet approved or cleared by the [place] FDA and  has been authorized for detection and/or diagnosis of SARS-CoV-2 by FDA under an Emergency Use Authorization (EUA). This EUA will remain  in effect (meaning this test can be used) for the duration of the COVID-19 declaration under Section 564(b)(1) of the Act, 21 U.S.C. section 360bbb-3(b)(1), unless the authorization is terminated or revoked sooner.     Influenza A by PCR NEGATIVE NEGATIVE Final   Influenza B by PCR NEGATIVE NEGATIVE Final    Comment: (NOTE) The Xpert Xpress SARS-CoV-2/FLU/RSV assay is intended as an aid in  the diagnosis of influenza from Nasopharyngeal swab specimens and  should not be used as a sole basis for treatment. Nasal washings and  aspirates are unacceptable for Xpert Xpress SARS-CoV-2/FLU/RSV  testing.  Fact Sheet for Patients: PinkCheek.be  Fact Sheet for Healthcare Providers: GravelBags.it  This test is not yet approved or cleared by the [place] FDA and  has been authorized for detection and/or diagnosis of SARS-CoV-2 by  FDA under  an Emergency Use Authorization (EUA). This EUA will remain  in effect (meaning this test can be used) for the duration of the  Covid-19 declaration under Section 564(b)(1) of the Act, 21  U.S.C. section 360bbb-3(b)(1), unless the authorization is  terminated or revoked. Performed at [facility], [address] [place], [place] [address]     Coagulation Studies: No results for input(s): LABPROT, INR in the last 72 hours.  Urinalysis: No results for input(s): COLORURINE, LABSPEC, PHURINE, GLUCOSEU, HGBUR, BILIRUBINUR, KETONESUR, PROTEINUR, UROBILINOGEN, NITRITE, LEUKOCYTESUR in the last 72 hours.  Invalid input(s): APPERANCEUR    Imaging: US RENAL  Result Date: 10/29/2019 CLINICAL DATA:  Acute renal failure. EXAM: RENAL / URINARY TRACT ULTRASOUND COMPLETE COMPARISON:  None. FINDINGS: Right Kidney: Renal  measurements: 11.3 cm x 4.9 cm x 5.0 cm = volume: 147 mL. Echogenicity within normal limits. No mass or hydronephrosis visualized. Left Kidney: Renal measurements: 10.9 cm x 5.1 cm x 4.9 cm = volume: 142 mL. Echogenicity within normal limits. No mass or hydronephrosis visualized. Bladder: Appears normal for degree of bladder distention. Other: None. IMPRESSION: Normal renal ultrasound. Electronically Signed   By: Virgina Norfolk M.D.   On: 10/29/2019 20:08     Medications:   . sodium chloride    . albumin human 12.5 g (10/31/19 0950)   . aspirin EC  81 mg Oral QPM  . enoxaparin (LOVENOX) injection  40 mg Subcutaneous Q12H  . ferrous sulfate  325 mg Oral Q breakfast  . insulin aspart  0-15 Units Subcutaneous TID WC  . insulin aspart  0-5 Units Subcutaneous QHS  . metoprolol tartrate  50 mg Oral BID  . midodrine  10 mg Oral TID WC  . multivitamin with minerals  1 tablet Oral Daily  . pantoprazole  40 mg Oral BID  . pravastatin  20 mg Oral q1800  . sodium chloride flush  3 mL Intravenous Q12H  . vancomycin  125 mg Oral QID   sodium chloride, albuterol, melatonin,  ondansetron **OR** ondansetron (ZOFRAN) IV, simethicone, sodium chloride flush, traMADol  Assessment/ Plan:  69 y.o. male with diabetes, hypertension, BPH atrial fibrillation, morbid obesity, Karlene Lineman, cirrhosis of the liver, chronic kidney disease, diabetic nephropathy, congestive heart failure, chronic brain atrophy, small vessel ischemia, old right occipital infarct, leg wounds, hospitalization for C. difficile in September 2021, was admitted on 10/28/2019 with Generalized weakness [R53.1]   1.  Acute kidney injury/chronic kidney disease stage IIIa.  Baseline creatinine 1.4, EGFR 49.   Creatinine 2.09 today, improving Keep holding Lasix and lisinopril   2.  Lower extremity edema.   Maintain low-sodium diet Patient still has 2+ edema  continue IV albumin  3.  Hyponatremia.  Likely due to underlying liver disease.   Serum sodium normalized to 136.   We will continue to monitor   LOS: 3 Marvalene Barrett 10/4/20212:14 PM

## 2019-10-31 NOTE — Discharge Summary (Signed)
Physician Discharge Summary  Christian Berg MVE:[id number] DOB: July 17, 1950 DOA: 10/28/2019  PCP: Birdie Sons, MD  Admit date: 10/28/2019 Discharge date: 10/31/2019  Admitted From: Home Disposition:  Home  Recommendations for Outpatient Follow-up:  1. Follow up with PCP in 1-2 weeks 2. Follow-up with nephrology in 3 to 4 days 3. Please obtain BMP/CBC in one week 4. Please follow up on the following pending results: None  Home Health: Yes Equipment/Devices: Rolling walker Discharge Condition: Stable CODE STATUS: Full Diet recommendation: Heart Healthy / Carb Modified   Brief/Interim Summary: Christian R Atwateris a 69 y.o.malewith medical history significant forhypertension, diabetes mellitus with complications of stage III chronic kidney disease, chronic atrial fibrillation on Coumadin, history of congestive heart failure and disseminated superficial actinic porokeratosis. He was recently discharged from the hospital on 10/23/2019 for acute on chronic diastolic CHF. He said that he has been confused since he was discharged from the hospital.  He was found to have acute kidney injury. He was treated with IV fluids but this has been held because of concern for fluid overload. Nephrologist was consulted and he has been started on IV albumin for oncotic support. Lasix and lisinopril was held during hospitalization and on discharge, he will follow-up with nephrology closely and they can restart as needed.  His creatinine was improving on discharge, it was 2.09 with baseline of 1.4 on the day of discharge. He was also started on midodrine for softer blood pressure and he will continue with metoprolol for rate control.  Blood pressure was within goal on discharge.  Our physical therapist recommended SNF placement.  Apparently patient has used most of his rehab days, he wants to go home with home health services which were ordered.  Patient will continue with rest of his home meds except  Lasix and lisinopril and will follow up with his providers for further management.  Discharge Diagnoses:  Principal Problem:   AKI (acute kidney injury) ([place]) Active Problems:   (HFpEF) heart failure with preserved ejection fraction (HCC)   Diabetes mellitus ([place])   Chronic atrial fibrillation (HCC)   C. difficile colitis   Generalized weakness   Obesity  Discharge Instructions  Discharge Instructions    (HEART FAILURE PATIENTS) Call MD:  Anytime you have any of the following symptoms: 1) 3 pound weight gain in 24 hours or 5 pounds in 1 week 2) shortness of breath, with or without a dry hacking cough 3) swelling in the hands, feet or stomach 4) if you have to sleep on extra pillows at night in order to breathe.   Complete by: As directed    Diet - low sodium heart healthy   Complete by: As directed    Discharge instructions   Complete by: As directed    It was pleasure taking care of you. I am holding your Lasix and lisinopril as your kidney functions are improving. Please follow-up with your nephrologist in few days and they can reassess the need and restart accordingly.   Increase activity slowly   Complete by: As directed    No wound care   Complete by: As directed      Allergies as of 10/31/2019      Reactions   Clindamycin/lincomycin    Chest discomfort   Sulfa Antibiotics Hives   Causes Raised red patches all over body   Sulfasalazine Hives   Causes raised red patches over body      Medication List    STOP taking these medications  furosemide 20 MG tablet Commonly known as: LASIX   lisinopril 20 MG tablet Commonly known as: ZESTRIL   vancomycin 125 MG capsule Commonly known as: VANCOCIN     TAKE these medications   albuterol 108 (90 Base) MCG/ACT inhaler Commonly known as: VENTOLIN HFA Inhale 2 puffs into the lungs every 6 (six) hours as needed for wheezing or shortness of breath.   aspirin 81 MG EC tablet Take 81 mg by mouth every evening.    ferrous sulfate 325 (65 FE) MG tablet Take 325 mg by mouth daily with breakfast.   Gerhardt's butt cream Crea Apply 1 application topically 3 (three) times daily.   hydrocerin Crea Apply 1 application topically daily.   insulin glargine 100 UNIT/ML Solostar Pen Commonly known as: LANTUS Inject 10 Units into the skin at bedtime.   ketoconazole 2 % cream Commonly known as: NIZORAL Apply 1 application topically daily as needed for irritation.   lovastatin 20 MG tablet Commonly known as: MEVACOR Take 1 tablet (20 mg total) by mouth every evening.   metFORMIN 1000 MG tablet Commonly known as: GLUCOPHAGE Take 1 tablet by mouth twice daily What changed:   how much to take  when to take this   metoprolol tartrate 50 MG tablet Commonly known as: LOPRESSOR Take 1 tablet (50 mg total) by mouth 2 (two) times daily.   midodrine 10 MG tablet Commonly known as: PROAMATINE Take 1 tablet (10 mg total) by mouth 3 (three) times daily with meals.   multivitamin with minerals Tabs tablet Take 1 tablet by mouth daily.   pantoprazole 40 MG tablet Commonly known as: Protonix Take 1 tablet (40 mg total) by mouth 2 (two) times daily.   simethicone 80 MG chewable tablet Commonly known as: MYLICON Chew 1 tablet (80 mg total) by mouth every 6 (six) hours as needed for flatulence (or gas pains).   traMADol-acetaminophen 37.5-325 MG tablet Commonly known as: ULTRACET Take 1 tablet by mouth every 6 (six) hours as needed for severe pain.   traZODone 50 MG tablet Commonly known as: DESYREL Take 1 tablet (50 mg total) by mouth at bedtime as needed for sleep.       Follow-up Information    [name], [name], MD. Schedule an appointment as soon as possible for a visit in 3 day(s).   Specialty: Nephrology Contact information: [place] [address] [phone]        Birdie Sons, MD. Schedule an appointment as soon as possible for a visit in 1 week(s).    Specialty: Family Medicine Contact information: [address] [place] [address] [phone]              Allergies  Allergen Reactions  . Clindamycin/Lincomycin     Chest discomfort  . Sulfa Antibiotics Hives    Causes Raised red patches all over body  . Sulfasalazine Hives    Causes raised red patches over body    Consultations:  Nephrology  Procedures/Studies: CT ABDOMEN PELVIS WO CONTRAST  Result Date: 10/22/2019 CLINICAL DATA:  Infectious gastroenteritis or colitis persistent abdominal pain, distention EXAM: CT ABDOMEN AND PELVIS WITHOUT CONTRAST TECHNIQUE: Multidetector CT imaging of the abdomen and pelvis was performed following the standard protocol without IV contrast. COMPARISON:  Contrast-enhanced CT 5 days ago FINDINGS: Lower chest: Small left pleural effusion is similar to prior exam. Left greater than right basilar atelectasis. Cardiomegaly with pacemaker partially included. Hepatobiliary: Cirrhotic hepatic morphology with nodular contours. No focal  abnormality on noncontrast exam. Unremarkable gallbladder. Pancreas: No ductal dilatation or evidence of acute inflammation. Spleen: Upper normal in size spanning 12.5 cm cranial caudal. Adrenals/Urinary Tract: Normal adrenal glands. Bilateral renal parenchymal thinning. No hydronephrosis. Calcification in the lower left kidney may be nonobstructing stone or vascular. Urinary bladder partially distended. There is mild bladder wall thickening. Stomach/Bowel: Bowel evaluation is limited in the absence of enteric contrast and presence of intra-abdominal ascites. No evidence of gastric wall thickening for degree of distension. Probable surgical clips in the duodenum. There is no small bowel obstruction or obvious wall thickening. Appendix surgically absent per history. Improved wall thickening of the descending and sigmoid colon over the past 5 days. No new or progressive colonic wall thickening. Small volume of  formed stool. Vascular/Lymphatic: Aorto bi-iliac atherosclerosis. No aortic aneurysm. Re- cannulated umbilical vein is better demonstrated on prior contrast-enhanced exam. No bulky abdominopelvic adenopathy. Reproductive: Prostate is unremarkable. Other: Small to moderate ascites, unchanged from prior exam. No free air. No evidence of focal abscess. Subcutaneous body wall edema, left greater than right, with slight increase. Fat in the inguinal canals. Small umbilical hernia containing fat and recannulated umbilical vein. Musculoskeletal: Stable. No acute findings. Degenerative change in the spine. IMPRESSION: 1. Improved wall thickening of the descending and sigmoid colon over the past 5 days. No new or progressive colonic wall thickening. 2. Cirrhosis with small to moderate ascites. Sequela of portal hypertension better demonstrated on prior contrast-enhanced exam. Increased body wall edema since prior. 3. Small left pleural effusion is similar to prior exam. Aortic Atherosclerosis (ICD10-I70.0). Electronically Signed   By: Keith Rake M.D.   On: 10/22/2019 17:32   DG Chest 1 View  Result Date: 10/12/2019 CLINICAL DATA:  Shortness of breath. EXAM: CHEST  1 VIEW COMPARISON:  October 08, 2019 FINDINGS: There is a dual lead AICD. Mild diffusely increased interstitial lung markings are seen the which is predominant stable in severity when compared to the prior exam. Mild to moderate severity prominence of the perihilar pulmonary vasculature is noted which represents a new finding. There is no evidence of a pleural effusion or pneumothorax. Stable moderate to marked severity enlargement of the cardiac silhouette is seen. A radiopaque fixation plate and multiple fixation screws are seen within the proximal left humerus. IMPRESSION: 1. Stable cardiomegaly with mild to moderate severity pulmonary vascular congestion. Electronically Signed   By: Virgina Norfolk M.D.   On: 10/12/2019 02:13   DG Chest 2  View  Result Date: 10/08/2019 CLINICAL DATA:  Shortness of breath, open wounds to the posterior left lower leg and anterior right lower leg, history of asthma EXAM: CHEST - 2 VIEW COMPARISON:  Radiograph 08/08/2019, CT 09/15/2015 FINDINGS: Diffuse hazy basilar predominant interstitial opacities with some fissural thickening as well. More patchy retrocardiac infrahilar opacities in the lung bases. No pneumothorax or visible effusion. Prominent cardiac silhouette compatible with cardiomegaly seen on comparison studies. Pacer pack overlies the left chest wall with leads at the cardiac apex and right atrium in similar positioning to prior. Remaining cardiomediastinal contours are unremarkable. No acute osseous or soft tissue abnormality. Degenerative changes are present in the imaged spine and shoulders. Prior left humeral ORIF is noted. IMPRESSION: 1. Features of CHF/volume overload with cardiomegaly and interstitial opacities suggesting edema. 2. More patchy retrocardiac and infrahilar opacities in the lung bases may reflect developing alveolar edema, atelectasis or airspace disease. Electronically Signed   By: Lovena Le M.D.   On: 10/08/2019 17:36   DG Tibia/Fibula Left  Result Date: 10/08/2019 CLINICAL DATA:  Open wound to posterior left lower leg. EXAM: LEFT TIBIA AND FIBULA - 2 VIEW COMPARISON:  None. FINDINGS: There is no evidence of fracture or other focal bone lesions. Mild diffuse soft tissue swelling is seen throughout the left calf with moderate severity soft tissue swelling also noted along the anterior aspect of the left tibia. No soft tissue air is seen. IMPRESSION: 1. Soft tissue swelling which may be secondary to diffuse cellulitis, without evidence of an acute osseous abnormality. Electronically Signed   By: Virgina Norfolk M.D.   On: 10/08/2019 19:49   CT Head Wo Contrast  Result Date: 10/28/2019 CLINICAL DATA:  Mental status changes. EXAM: CT HEAD WITHOUT CONTRAST TECHNIQUE: Contiguous  axial images were obtained from the base of the skull through the vertex without intravenous contrast. COMPARISON:  08/08/2019 FINDINGS: Brain: Mild cerebral atrophy. Mild ventricular dilatation consistent with central atrophy. Low-attenuation changes in the deep white matter consistent with small vessel ischemia. Encephalomalacia in the right occipital lobe consistent with old infarct. No change in appearance since prior study. No mass-effect or midline shift. No abnormal extra-axial fluid collections. Basal cisterns are not effaced. No acute intracranial hemorrhage. Vascular: Intracranial arterial vascular calcifications are present. Skull: Calvarium appears intact. Sinuses/Orbits: Paranasal sinuses and mastoid air cells are clear. Other: None. IMPRESSION: 1. No acute intracranial abnormalities. 2. Chronic atrophy and small vessel ischemia. Old right occipital infarct. Electronically Signed   By: Lucienne Capers M.D.   On: 10/28/2019 04:49   CT ABDOMEN PELVIS W CONTRAST  Result Date: 10/17/2019 CLINICAL DATA:  Upper abdominal pain. EXAM: CT ABDOMEN AND PELVIS WITH CONTRAST TECHNIQUE: Multidetector CT imaging of the abdomen and pelvis was performed using the standard protocol following bolus administration of intravenous contrast. CONTRAST:  <See Chart> OMNIPAQUE IOHEXOL 300 MG/ML SOLN, 22m OMNIPAQUE IOHEXOL 350 MG/ML SOLN COMPARISON:  August 04, 2019 FINDINGS: Lower chest: There is a small left-sided pleural effusion. There is bibasilar atelectasis.The heart is enlarged. Hepatobiliary: The liver is cirrhotic. Normal gallbladder.There is no biliary ductal dilation. Pancreas: Normal contours without ductal dilatation. No peripancreatic fluid collection. Spleen: Unremarkable. Adrenals/Urinary Tract: --Adrenal glands: Unremarkable. --Right kidney/ureter: No hydronephrosis or radiopaque kidney stones. --Left kidney/ureter: No hydronephrosis or radiopaque kidney stones. --Urinary bladder: The bladder is decompressed  by Foley catheter and therefore is poorly evaluated. Stomach/Bowel: --Stomach/Duodenum: There is a surgical clip within the proximal duodenum. --Small bowel: Unremarkable. --Colon: There is mild diffuse circumferential wall thickening of the sigmoid and descending colon. There is a metallic foreign body at the level of the proximal descending colon which may be related to recent intervention. --Appendix: Surgically absent. Vascular/Lymphatic: Atherosclerotic calcification is present within the non-aneurysmal abdominal aorta, without hemodynamically significant stenosis. There is evidence for portal hypertension with recanalization of the umbilical vein in addition to extensive portosystemic shunting. The portal vein appears patent. The SMV is patent. The splenic vein remains patent. There may be some small esophageal varices. --No retroperitoneal lymphadenopathy. --No mesenteric lymphadenopathy. --No pelvic or inguinal lymphadenopathy. Reproductive: Unremarkable Other: There is a small volume of abdominal ascites. The abdominal wall is normal. Musculoskeletal. No acute displaced fractures. IMPRESSION: 1. Mild diffuse circumferential wall thickening of the sigmoid and descending colon may be secondary to underdistention or mild colitis. 2. Cirrhosis with stigmata of portal hypertension including recanalization of the umbilical vein and extensive portosystemic shunting. 3. Small volume abdominal ascites. 4. Small left-sided pleural effusion with bibasilar atelectasis. 5. Cardiomegaly. Aortic Atherosclerosis (ICD10-I70.0). Electronically Signed   By: CHarrell Gave  Green M.D.   On: 10/17/2019 16:41   US RENAL  Result Date: 10/29/2019 CLINICAL DATA:  Acute renal failure. EXAM: RENAL / URINARY TRACT ULTRASOUND COMPLETE COMPARISON:  None. FINDINGS: Right Kidney: Renal measurements: 11.3 cm x 4.9 cm x 5.0 cm = volume: 147 mL. Echogenicity within normal limits. No mass or hydronephrosis visualized. Left Kidney: Renal  measurements: 10.9 cm x 5.1 cm x 4.9 cm = volume: 142 mL. Echogenicity within normal limits. No mass or hydronephrosis visualized. Bladder: Appears normal for degree of bladder distention. Other: None. IMPRESSION: Normal renal ultrasound. Electronically Signed   By: Virgina Norfolk M.D.   On: 10/29/2019 20:08   DG Chest Portable 1 View  Result Date: 10/28/2019 CLINICAL DATA:  Altered mental status. EXAM: PORTABLE CHEST 1 VIEW COMPARISON:  10/16/2019 FINDINGS: Cardiac pacemaker. Shallow inspiration. Cardiac enlargement with central pulmonary vascular congestion. No definite edema or consolidation. No pleural effusions. No pneumothorax. Mediastinal contours appear intact. Calcification of the aorta. IMPRESSION: Cardiac enlargement with central pulmonary vascular congestion. Electronically Signed   By: Lucienne Capers M.D.   On: 10/28/2019 04:25   DG Chest Port 1 View  Result Date: 10/16/2019 CLINICAL DATA:  Hypertension, CHF EXAM: PORTABLE CHEST 1 VIEW COMPARISON:  10/12/2019 FINDINGS: Mild bilateral interstitial thickening. No pleural effusion or pneumothorax. No focal consolidation. Stable cardiomegaly. Dual lead cardiac pacemaker. No acute osseous abnormality. IMPRESSION: Cardiomegaly with mild pulmonary vascular congestion. Electronically Signed   By: Kathreen Devoid   On: 10/16/2019 15:37     Subjective: Patient has no new complaints when seen during morning rounds.  Later saw him again with some complaint of epigastric pain which improved with GI cocktail.  Wife was concerned about this epigastric pain.  Recently diagnosed duodenal ulcer.  Asked to follow-up with GI and continue with Protonix.  [place]  Discharge Exam: Vitals:   10/31/19 0800 10/31/19 1129  BP: 107/62 112/60  Pulse:  78  Resp:  18  Temp:  97.9 F (36.6 C)  SpO2:  96%   Vitals:   10/31/19 0509 10/31/19 0758 10/31/19 0800 10/31/19 1129  BP: (!) 119/53 (!) 94/54 107/62 112/60  Pulse: 91 96  78  Resp: 16 19  18   Temp:  98.4 F (36.9 C) 98.2 F (36.8 C)  97.9 F (36.6 C)  TempSrc: Oral Oral  Oral  SpO2: 97% 93%  96%  Weight: 131.8 kg     Height:        General: Pt is alert, awake, not in acute distress Cardiovascular: RRR, S1/S2 +, no rubs, no gallops Respiratory: CTA bilaterally, no wheezing, no rhonchi Abdominal: Soft, NT, ND, bowel sounds + Extremities: no edema, no cyanosis, signs of bilateral venous stasis dermatitis.   The results of significant diagnostics from this hospitalization (including imaging, microbiology, ancillary and laboratory) are listed below for reference.    Microbiology: Recent Results (from the past 240 hour(s))  Culture, blood (routine x 2)     Status: None (Preliminary result)   Collection Time: 10/28/19  4:17 AM   Specimen: BLOOD  Result Value Ref Range Status   Specimen Description BLOOD LEFT ANTECUBITAL  Final   Special Requests   Final    BOTTLES DRAWN AEROBIC AND ANAEROBIC Blood Culture adequate volume   Culture   Final    NO GROWTH 3 DAYS Performed at [facility], [address] [place], [place] [address]    Report Status PENDING  Incomplete  Culture, blood (routine x 2)     Status: None (  Preliminary result)   Collection Time: 10/28/19  4:17 AM   Specimen: BLOOD  Result Value Ref Range Status   Specimen Description BLOOD BLOOD RIGHT WRIST  Final   Special Requests   Final    BOTTLES DRAWN AEROBIC AND ANAEROBIC Blood Culture adequate volume   Culture   Final    NO GROWTH 3 DAYS Performed at [facility], [address], [place], [place] [address]    Report Status PENDING  Incomplete  Respiratory Panel by RT PCR (Flu A&B, Covid) - Nasopharyngeal Swab     Status: None   Collection Time: 10/28/19  4:17 AM   Specimen: Nasopharyngeal Swab  Result Value Ref Range Status   SARS Coronavirus 2 by RT PCR NEGATIVE NEGATIVE Final    Comment: (NOTE) SARS-CoV-2 target nucleic acids are NOT DETECTED.  The SARS-CoV-2 RNA is generally  detectable in upper respiratoy specimens during the acute phase of infection. The lowest concentration of SARS-CoV-2 viral copies this assay can detect is 131 copies/mL. A negative result does not preclude SARS-Cov-2 infection and should not be used as the sole basis for treatment or other patient management decisions. A negative result may occur with  improper specimen collection/handling, submission of specimen other than nasopharyngeal swab, presence of viral mutation(s) within the areas targeted by this assay, and inadequate number of viral copies (<131 copies/mL). A negative result must be combined with clinical observations, patient history, and epidemiological information. The expected result is Negative.  Fact Sheet for Patients:  PinkCheek.be  Fact Sheet for Healthcare Providers:  GravelBags.it  This test is no t yet approved or cleared by the [place] FDA and  has been authorized for detection and/or diagnosis of SARS-CoV-2 by FDA under an Emergency Use Authorization (EUA). This EUA will remain  in effect (meaning this test can be used) for the duration of the COVID-19 declaration under Section 564(b)(1) of the Act, 21 U.S.C. section 360bbb-3(b)(1), unless the authorization is terminated or revoked sooner.     Influenza A by PCR NEGATIVE NEGATIVE Final   Influenza B by PCR NEGATIVE NEGATIVE Final    Comment: (NOTE) The Xpert Xpress SARS-CoV-2/FLU/RSV assay is intended as an aid in  the diagnosis of influenza from Nasopharyngeal swab specimens and  should not be used as a sole basis for treatment. Nasal washings and  aspirates are unacceptable for Xpert Xpress SARS-CoV-2/FLU/RSV  testing.  Fact Sheet for Patients: PinkCheek.be  Fact Sheet for Healthcare Providers: GravelBags.it  This test is not yet approved or cleared by the [place] FDA and   has been authorized for detection and/or diagnosis of SARS-CoV-2 by  FDA under an Emergency Use Authorization (EUA). This EUA will remain  in effect (meaning this test can be used) for the duration of the  Covid-19 declaration under Section 564(b)(1) of the Act, 21  U.S.C. section 360bbb-3(b)(1), unless the authorization is  terminated or revoked. Performed at [facility], [address] [place], [place] [address]      Labs: BNP (last 3 results) Recent Labs    10/16/19 1843 10/17/19 0614 10/28/19 0417  BNP 451.9* 445.1* 629.4*   Basic Metabolic Panel: Recent Labs  Lab 10/28/19 0417 10/29/19 0531 10/30/19 0501 10/31/19 0449  NA 132* 131* 134* 136  K 4.5 4.5 4.7 4.5  CL 96* 99 103 103  CO2 24 24 21* 24  GLUCOSE 119* 73 110* 91  BUN 28* 29* 28* 27*  CREATININE 3.85* 3.08* 2.58* 2.09*  CALCIUM 8.3* 8.5* 8.2*  8.6*  MG 2.2  --   --   --    Liver Function Tests: Recent Labs  Lab 10/28/19 0417  AST 40  ALT 19  ALKPHOS 136*  BILITOT 1.4*  PROT 7.1  ALBUMIN 2.5*   No results for input(s): LIPASE, AMYLASE in the last 168 hours. Recent Labs  Lab 10/28/19 0417  AMMONIA 13   CBC: Recent Labs  Lab 10/28/19 0417 10/29/19 0531 10/30/19 0501 10/31/19 0449  WBC 7.7 5.7 6.1 6.3  NEUTROABS 5.7  --  4.0  --   HGB 9.5* 9.0* 9.0* 9.3*  HCT 29.9* 27.0* 27.4* 27.5*  MCV 96.8 93.4 94.5 92.3  PLT 232 187 189 206   Cardiac Enzymes: No results for input(s): CKTOTAL, CKMB, CKMBINDEX, TROPONINI in the last 168 hours. BNP: Invalid input(s): POCBNP CBG: Recent Labs  Lab 10/30/19 1117 10/30/19 1634 10/30/19 2046 10/31/19 0758 10/31/19 1128  GLUCAP 84 101* 89 79 122*   D-Dimer No results for input(s): DDIMER in the last 72 hours. Hgb A1c No results for input(s): HGBA1C in the last 72 hours. Lipid Profile No results for input(s): CHOL, HDL, LDLCALC, TRIG, CHOLHDL, LDLDIRECT in the last 72 hours. Thyroid function studies No results for input(s): TSH,  T4TOTAL, T3FREE, THYROIDAB in the last 72 hours.  Invalid input(s): FREET3 Anemia work up No results for input(s): VITAMINB12, FOLATE, FERRITIN, TIBC, IRON, RETICCTPCT in the last 72 hours. Urinalysis    Component Value Date/Time   COLORURINE AMBER (A) 10/28/2019 0506   APPEARANCEUR HAZY (A) 10/28/2019 0506   APPEARANCEUR Cloudy (A) 08/20/2017 1309   LABSPEC 1.025 10/28/2019 0506   LABSPEC 1.041 09/14/2013 1658   PHURINE 5.0 10/28/2019 0506   GLUCOSEU NEGATIVE 10/28/2019 0506   GLUCOSEU 50 mg/dL 09/14/2013 1658   HGBUR NEGATIVE 10/28/2019 0506   BILIRUBINUR NEGATIVE 10/28/2019 0506   BILIRUBINUR negative 04/08/2019 1205   BILIRUBINUR Negative 08/20/2017 1309   BILIRUBINUR Negative 09/14/2013 1658   KETONESUR NEGATIVE 10/28/2019 0506   PROTEINUR NEGATIVE 10/28/2019 0506   UROBILINOGEN 0.2 04/08/2019 1205   NITRITE NEGATIVE 10/28/2019 0506   LEUKOCYTESUR NEGATIVE 10/28/2019 0506   LEUKOCYTESUR Negative 09/14/2013 1658   Sepsis Labs Invalid input(s): PROCALCITONIN,  WBC,  LACTICIDVEN Microbiology Recent Results (from the past 240 hour(s))  Culture, blood (routine x 2)     Status: None (Preliminary result)   Collection Time: 10/28/19  4:17 AM   Specimen: BLOOD  Result Value Ref Range Status   Specimen Description BLOOD LEFT ANTECUBITAL  Final   Special Requests   Final    BOTTLES DRAWN AEROBIC AND ANAEROBIC Blood Culture adequate volume   Culture   Final    NO GROWTH 3 DAYS Performed at [facility], [address], [place], [place] [address]    Report Status PENDING  Incomplete  Culture, blood (routine x 2)     Status: None (Preliminary result)   Collection Time: 10/28/19  4:17 AM   Specimen: BLOOD  Result Value Ref Range Status   Specimen Description BLOOD BLOOD RIGHT WRIST  Final   Special Requests   Final    BOTTLES DRAWN AEROBIC AND ANAEROBIC Blood Culture adequate volume   Culture   Final    NO GROWTH 3 DAYS Performed at [facility], [address] [place], [place] [address]    Report Status PENDING  Incomplete  Respiratory Panel by RT PCR (Flu A&B, Covid) - Nasopharyngeal Swab     Status: None   Collection Time: 10/28/19  4:17  AM   Specimen: Nasopharyngeal Swab  Result Value Ref Range Status   SARS Coronavirus 2 by RT PCR NEGATIVE NEGATIVE Final    Comment: (NOTE) SARS-CoV-2 target nucleic acids are NOT DETECTED.  The SARS-CoV-2 RNA is generally detectable in upper respiratoy specimens during the acute phase of infection. The lowest concentration of SARS-CoV-2 viral copies this assay can detect is 131 copies/mL. A negative result does not preclude SARS-Cov-2 infection and should not be used as the sole basis for treatment or other patient management decisions. A negative result may occur with  improper specimen collection/handling, submission of specimen other than nasopharyngeal swab, presence of viral mutation(s) within the areas targeted by this assay, and inadequate number of viral copies (<131 copies/mL). A negative result must be combined with clinical observations, patient history, and epidemiological information. The expected result is Negative.  Fact Sheet for Patients:  PinkCheek.be  Fact Sheet for Healthcare Providers:  GravelBags.it  This test is no t yet approved or cleared by the [place] FDA and  has been authorized for detection and/or diagnosis of SARS-CoV-2 by FDA under an Emergency Use Authorization (EUA). This EUA will remain  in effect (meaning this test can be used) for the duration of the COVID-19 declaration under Section 564(b)(1) of the Act, 21 U.S.C. section 360bbb-3(b)(1), unless the authorization is terminated or revoked sooner.     Influenza A by PCR NEGATIVE NEGATIVE Final   Influenza B by PCR NEGATIVE NEGATIVE Final    Comment: (NOTE) The Xpert Xpress SARS-CoV-2/FLU/RSV assay is intended as an aid in  the  diagnosis of influenza from Nasopharyngeal swab specimens and  should not be used as a sole basis for treatment. Nasal washings and  aspirates are unacceptable for Xpert Xpress SARS-CoV-2/FLU/RSV  testing.  Fact Sheet for Patients: PinkCheek.be  Fact Sheet for Healthcare Providers: GravelBags.it  This test is not yet approved or cleared by the [place] FDA and  has been authorized for detection and/or diagnosis of SARS-CoV-2 by  FDA under an Emergency Use Authorization (EUA). This EUA will remain  in effect (meaning this test can be used) for the duration of the  Covid-19 declaration under Section 564(b)(1) of the Act, 21  U.S.C. section 360bbb-3(b)(1), unless the authorization is  terminated or revoked. Performed at [facility], [address] [place], [place] [address]     Time coordinating discharge: Over 30 minutes  SIGNED:  Lorella Nimrod, MD  [facility] 10/31/2019, 2:07 PM  If 7PM-7AM, please contact night-coverage [email or web address]  This record has been created using Systems analyst. Errors have been sought and corrected,but may not always be located. Such creation errors do not reflect on the standard of care.

## 2019-10-31 NOTE — Discharge Instructions (Signed)
Delirium Delirium is a state of mental confusion. It comes on quickly and causes significant changes in a person's thinking and behavior. People with delirium usually have trouble paying attention to what is going on or knowing where they are. They may become very withdrawn or very emotional and unable to sit still. They may even see or feel things that are not there (hallucinations). Delirium is a sign of a serious underlying medical condition. What are the causes? Delirium occurs when something suddenly affects the signals that the brain sends out. Brain signals can be affected by anything that puts severe stress on the body and brain and causes brain chemicals to be out of balance. The most common causes of delirium include:  Infections. These may be bacterial, viral, fungal, or protozoal.  Medicines. These include many over-the-counter and prescription medicines.  Recreational drugs.  Substance withdrawal. This occurs with sudden discontinuation of alcohol, certain medicines, or recreational drugs.  Surgery and anesthesia.  Sudden vascular events, such as stroke and brain hemorrhage.  Other brain disorders, such as migraines, tumors, seizures, and physical head trauma.  Metabolic disorders, such as kidney or liver failure.  Low blood oxygen (anoxia). This may occur with lung disease, cardiac arrest, or carbon monoxide poisoning.  Hormone imbalances (endocrinopathies), such as an overactive thyroid (hyperthyroidism) or underactive thyroid (hypothyroidism).  Vitamin deficiencies. What increases the risk? The following factors may make someone more likely to develop this condition.  Being a child.  Being an older person.  Living alone.  Having vision loss or hearing loss.  Having an existing brain disease, such as dementia.  Having long-lasting (chronic) medical conditions, such as heart disease.  Being hospitalized for long periods of time. What are the signs or  symptoms? Delirium starts with a sudden change in a person's thinking or behavior. Symptoms include:  Not being able to stay awake (drowsiness) or pay attention.  Being confused about places, time, and people.  Forgetfulness.  Having extreme energy levels. These may be low or high.  Changes in sleep patterns.  Extreme mood swings, such as sudden anger or anxiety.  Focusing on things or ideas that are not important.  Rambling and senseless talking.  Difficulty speaking, understanding speech, or both.  Hallucinations.  Tremor or unsteady gait. Symptoms come and go (fluctuate) over time, and they are often worse at the end of the day. How is this diagnosed? People with delirium may not realize that they have the condition. Often, a family member or health care provider is the first person to notice the changes. This condition may be diagnosed based on a physical exam, health history, and tests.  The health care provider will obtain a detailed history. This may include questions about: ? Current symptoms. ? Medical issues. ? Medicines. ? Recreational drug use.  The health care provider will perform a mental status examination by: ? Asking questions to check for confusion. ? Watching for abnormal behavior.  The health care provider may also order lab tests or additional studies to determine the cause of the delirium. How is this treated? Treatment of delirium depends on the cause and severity. Delirium usually goes away within days or weeks of treating the underlying cause. In the meantime, do not leave the person alone because he or she may accidentally cause self-harm. This condition may be treated with supportive care, such as:  Increased light during the day and decreased light at night.  Low noise level.  Uninterrupted sleep.  A regular daily schedule.  Clocks and calendars to help with orientation.  Familiar objects, including the person's pictures and  clothing.  Frequent visits from familiar family and friends.  A healthy diet.  Gentle exercise. In more severe cases of delirium, medicine may be prescribed to help the person keep calm and think more clearly. Follow these instructions at home:  Continue supportive care as told by a health care provider.  Over-the-counter and prescription medicines should be taken only as told by a health care provider.  Ask a health care provider before using herbs or supplements.  Do not use alcohol or recreational drugs.  Keep all follow-up visits as told by a health care provider. This is important. Contact a health care provider if:  Symptoms do not get better or they become worse.  New symptoms of delirium develop.  Caring for the person at home does not seem safe.  Eating, drinking, or communicating stops.  There are side effects of medicines, such as changes in sleep patterns, dizziness, weight gain, restlessness, movement changes, or tremors. Get help right away if:  Serious thoughts occur about self-harm or about hurting others.  There are serious side effects of medicine, such as: ? Swelling of the face, lips, tongue, or throat. ? Fever, confusion, muscle spasms, or seizures. Summary  Delirium is a state of mental confusion. It comes on quickly and causes significant changes in a person's thinking and behavior.  Delirium is a sign of a serious underlying medical condition.  Certain medical conditions or a long hospital stay may increase the risk of developing delirium.  Treatment of delirium involves treating the underlying cause and providing supportive treatments, such as a calm and familiar environment. This information is not intended to replace advice given to you by your health care provider. Make sure you discuss any questions you have with your health care provider. Document Revised: 09/03/2017 Document Reviewed: 09/03/2017 Elsevier Patient Education  [place].

## 2019-10-31 NOTE — Progress Notes (Signed)
Pt d/c'd home via EMS. Pt sent with all belongings, discharge instructions given to pt.

## 2019-10-31 NOTE — Care Management Important Message (Signed)
Important Message  Patient Details  Name: Christian Berg MRN: [id number] Date of Birth: Jul 14, 1950   Medicare Important Message Given:  Yes     [name] 10/31/2019, 1:04 PM

## 2019-10-31 NOTE — Progress Notes (Signed)
CSW faxed pt's D/C packet and unit secretary voiced understanding the fax had been sent and should be going through now and stated she would call there is a problem.  CSW will continue to follow for D/C needs.  Alphonse Guild. Talib Headley  MSW, LCSW, LCAS, CCS Transitions of Care Clinical Social Worker Care Coordination Department Ph: [phone]

## 2019-10-31 NOTE — TOC Transition Note (Signed)
Transition of Care [facility]) - CM/SW Discharge Note   Patient Details  Name: Christian Berg MRN: [id number] Date of Birth: [birth year]  [facility] of [facility] CM/SW Contact:  Victorino Dike, RN Phone Number: 10/31/2019, 1:49 PM   Clinical Narrative:     Spoke with patien'ts wife [name].  Patient and wife are declining SNF at this time.  They are in agreement with Home Health Service, these will be provided by [place] (PT, OT, RN, AIDE).  Information provided verbally for private paid caregivers and that is something the wife is not interested in at this time.   Final next level of care: [place] Barriers to Discharge: Barriers Resolved   Patient Goals and CMS Choice     Choice offered to / list presented to : Spouse  Discharge Placement                       Discharge Plan and Services     Post Acute Care Choice: Skilled Nursing Facility                    HH Arranged: PT, OT, RN, Nurse's Aide [facility] Agency: [place] ([place]) Date [facility] Agency Contacted: 10/31/19 Time [place]: 1347 Representative spoke with at [place]: [place] ([place]) Interventions     Readmission Risk Interventions Readmission Risk Prevention Plan 10/12/2019  Transportation Screening Complete  PCP or Specialist Appt within 3-5 Days (No Data)  [place] or [place] Complete  Palliative Care Screening Not Applicable  Medication Review (RN Care Manager) Complete  Some recent data might be hidden

## 2019-10-31 NOTE — Progress Notes (Addendum)
CSW received a call from the [facility] at [facility] stating pt needs transport home via [place] and that:  RN is [place].  Unit is 2-A  Floor phone is at ph: [phone].  AC can be reached at ph: [phone]  CSW called pt's RN  CSW will continue to follow for D/C needs.  Alphonse Guild. Clarisse Rodriges  MSW, LCSW, LCAS, CCS Transitions of Care Clinical Social Worker Care Coordination Department Ph: [phone]

## 2019-11-01 ENCOUNTER — Telehealth: Payer: Self-pay

## 2019-11-01 NOTE — Telephone Encounter (Signed)
Ok, to order lift chair for his vehicle.

## 2019-11-01 NOTE — Telephone Encounter (Signed)
Order written and placed on Dr. Maralyn Sago desk for review and signature.   Dr. Caryn Section, do we need to add a Dx code to the written order.

## 2019-11-01 NOTE — Telephone Encounter (Signed)
HFU scheduled for 11/14/19 @ 10:00 AM.

## 2019-11-01 NOTE — Telephone Encounter (Signed)
Copied from [place] [id number]. Topic: General - Other >> Nov 01, 2019  1:07 PM Yvette Rack wrote: Reason for CRM: Pt stated he needs a Rx for a lift chair for the passenger side of his truck. Pt requests call back.

## 2019-11-01 NOTE — Telephone Encounter (Signed)
Transition Care Management Follow-up Telephone Call  Date of discharge and from where: [facility] on 10/31/19  How have you been since you were released from the hospital? Doing better and is no longer confused. Pt states he is sleeping and eating fine. Pt states he is still weak but he has started PT and is working to build up his strength. Declines pain, headachess, fever, SOB or n/v/d  Any questions or concerns? No   Items Reviewed:  Did the pt receive and understand the discharge instructions provided? Yes   Medications obtained and verified? No , declined reviewing medications at this time.  Any new allergies since your discharge? No   Dietary orders reviewed? Yes  Do you have support at home? Yes   Other (ie: DME, Home Health, etc): PT, OT and a nurse aid was ordered.  Functional Questionnaire: (I = Independent and D = Dependent)  Bathing/Dressing- I   Meal Prep- I  Eating- I  Maintaining continence- I  Transferring/Ambulation- D, uses a cane or walker at all times.  Managing Meds- I   Follow up appointments reviewed:    PCP Hospital f/u appt confirmed? Yes  scheduled to see Dr Caryn Section on 11/14/19 @ 10:00 AM.  [facility] f/u appt confirmed? No    Are transportation arrangements needed? Yes, referral placed to [facility].   If their condition worsens, is the pt aware to call  their PCP or go to the ED? Yes  Was the patient provided with contact information for the PCP's office or ED? Yes  Was the pt encouraged to call back with questions or concerns? Yes

## 2019-11-01 NOTE — Telephone Encounter (Signed)
Please review. Ok to write RX?

## 2019-11-02 ENCOUNTER — Telehealth: Payer: Self-pay | Admitting: *Deleted

## 2019-11-02 DIAGNOSIS — N179 Acute kidney failure, unspecified: Secondary | ICD-10-CM | POA: Diagnosis not present

## 2019-11-02 DIAGNOSIS — I130 Hypertensive heart and chronic kidney disease with heart failure and stage 1 through stage 4 chronic kidney disease, or unspecified chronic kidney disease: Secondary | ICD-10-CM | POA: Diagnosis not present

## 2019-11-02 DIAGNOSIS — I6529 Occlusion and stenosis of unspecified carotid artery: Secondary | ICD-10-CM | POA: Diagnosis not present

## 2019-11-02 DIAGNOSIS — I5032 Chronic diastolic (congestive) heart failure: Secondary | ICD-10-CM | POA: Diagnosis not present

## 2019-11-02 DIAGNOSIS — L565 Disseminated superficial actinic porokeratosis (DSAP): Secondary | ICD-10-CM | POA: Diagnosis not present

## 2019-11-02 DIAGNOSIS — L89312 Pressure ulcer of right buttock, stage 2: Secondary | ICD-10-CM | POA: Diagnosis not present

## 2019-11-02 DIAGNOSIS — I482 Chronic atrial fibrillation, unspecified: Secondary | ICD-10-CM | POA: Diagnosis not present

## 2019-11-02 DIAGNOSIS — E1122 Type 2 diabetes mellitus with diabetic chronic kidney disease: Secondary | ICD-10-CM | POA: Diagnosis not present

## 2019-11-02 DIAGNOSIS — I2510 Atherosclerotic heart disease of native coronary artery without angina pectoris: Secondary | ICD-10-CM | POA: Diagnosis not present

## 2019-11-02 DIAGNOSIS — N1832 Chronic kidney disease, stage 3b: Secondary | ICD-10-CM | POA: Diagnosis not present

## 2019-11-02 DIAGNOSIS — L89322 Pressure ulcer of left buttock, stage 2: Secondary | ICD-10-CM | POA: Diagnosis not present

## 2019-11-02 DIAGNOSIS — I6782 Cerebral ischemia: Secondary | ICD-10-CM | POA: Diagnosis not present

## 2019-11-02 LAB — CULTURE, BLOOD (ROUTINE X 2)
Culture: NO GROWTH
Culture: NO GROWTH
Special Requests: ADEQUATE
Special Requests: ADEQUATE

## 2019-11-02 NOTE — Chronic Care Management (AMB) (Signed)
  Chronic Care Management   Outreach Note  11/02/2019 Name: Christian Berg MRN: [id number] DOB: [birth year]  PHILIPPE GANG is a 69 y.o. year old male who is a primary care patient of Fisher, Kirstie Peri, MD. I reached out to Jacqlyn Krauss by phone today in response to a referral sent by Mr. KILLIAN SCHWER health plan.     An unsuccessful telephone outreach was attempted today. The patient was referred to the case management team for assistance with care management and care coordination.   Follow Up Plan: A HIPAA compliant phone message was left for the patient providing contact information and requesting a return call. The care management team will reach out to the patient again over the next 7 days. If patient returns call to provider office, please advise to call [place] at [phone].  [place] Management

## 2019-11-02 NOTE — Chronic Care Management (AMB) (Signed)
  Chronic Care Management   Note  11/02/2019 Name: Christian Berg MRN: [id number] DOB: 1950-11-07  Christian Berg is a 69 y.o. year old male who is a primary care patient of Fisher, Kirstie Peri, MD. I reached out to Jacqlyn Krauss by phone today in response to a referral sent by Christian Berg health plan.     Mr. Levene was given information about Chronic Care Management services today including:  1. CCM service includes personalized support from designated clinical staff supervised by his physician, including individualized plan of care and coordination with other care providers 2. 24/7 contact phone numbers for assistance for urgent and routine care needs. 3. Service will only be billed when office clinical staff spend 20 minutes or more in a month to coordinate care. 4. Only one practitioner may furnish and bill the service in a calendar month. 5. The patient may stop CCM services at any time (effective at the end of the month) by phone call to the office staff. 6. The patient will be responsible for cost sharing (co-pay) of up to 20% of the service fee (after annual deductible is met).  Patient did not agree to enrollment in care management services and does not wish to consider at this time. Patient would not like to be called and offered for CCM services.   Follow up plan: Patient declines engagement by the care management team. Appropriate care team members and provider have been notified via electronic communication.   [place] Management

## 2019-11-02 NOTE — Telephone Encounter (Signed)
Patient called the office during after hours this morning at 4:52am. He left a message with the after hours nurse stating that he forgot to mention to Dr. Caryn Section that the order was for workers comp. I gave Dr. Caryn Section this message. Per Dr. Caryn Section, patient needs to have the order written by the doctor that his handling his workers compensation. I advised patient of this and he verbalized understanding.

## 2019-11-03 ENCOUNTER — Telehealth: Payer: Self-pay | Admitting: Family Medicine

## 2019-11-03 DIAGNOSIS — I482 Chronic atrial fibrillation, unspecified: Secondary | ICD-10-CM | POA: Diagnosis not present

## 2019-11-03 DIAGNOSIS — I6529 Occlusion and stenosis of unspecified carotid artery: Secondary | ICD-10-CM | POA: Diagnosis not present

## 2019-11-03 DIAGNOSIS — L565 Disseminated superficial actinic porokeratosis (DSAP): Secondary | ICD-10-CM | POA: Diagnosis not present

## 2019-11-03 DIAGNOSIS — N1832 Chronic kidney disease, stage 3b: Secondary | ICD-10-CM | POA: Diagnosis not present

## 2019-11-03 DIAGNOSIS — I6782 Cerebral ischemia: Secondary | ICD-10-CM | POA: Diagnosis not present

## 2019-11-03 DIAGNOSIS — I130 Hypertensive heart and chronic kidney disease with heart failure and stage 1 through stage 4 chronic kidney disease, or unspecified chronic kidney disease: Secondary | ICD-10-CM | POA: Diagnosis not present

## 2019-11-03 DIAGNOSIS — E1122 Type 2 diabetes mellitus with diabetic chronic kidney disease: Secondary | ICD-10-CM | POA: Diagnosis not present

## 2019-11-03 DIAGNOSIS — I5032 Chronic diastolic (congestive) heart failure: Secondary | ICD-10-CM | POA: Diagnosis not present

## 2019-11-03 DIAGNOSIS — I2510 Atherosclerotic heart disease of native coronary artery without angina pectoris: Secondary | ICD-10-CM | POA: Diagnosis not present

## 2019-11-03 DIAGNOSIS — N179 Acute kidney failure, unspecified: Secondary | ICD-10-CM | POA: Diagnosis not present

## 2019-11-03 NOTE — Telephone Encounter (Signed)
That's fine

## 2019-11-03 NOTE — Telephone Encounter (Signed)
Christine, from [facility], called stating that she had start of care visit yesterday. She states that the pt has nursing, PT, OT, and Education officer, museum. She states that she would like to have a verbal order for the continuation of Nursing and Home health aid.   Nursing: 1x for 1wk, 2x for 1wk, 1x for 3wks, 2PRNs Home Health aid: 2x for 2wks, 1x for 2wks   [phone] secure line    Please advise.

## 2019-11-03 NOTE — Telephone Encounter (Signed)
Verbal orders given to [place].

## 2019-11-04 ENCOUNTER — Telehealth: Payer: Self-pay | Admitting: Family Medicine

## 2019-11-04 NOTE — Telephone Encounter (Signed)
Christian Berg is calling from [place] is calling to request verbal orders for OT for 1 week 3, verbal is ok on vm [phone]

## 2019-11-07 ENCOUNTER — Telehealth: Payer: Self-pay | Admitting: Family Medicine

## 2019-11-07 ENCOUNTER — Encounter: Payer: Self-pay | Admitting: Family Medicine

## 2019-11-07 DIAGNOSIS — I6782 Cerebral ischemia: Secondary | ICD-10-CM | POA: Diagnosis not present

## 2019-11-07 DIAGNOSIS — I130 Hypertensive heart and chronic kidney disease with heart failure and stage 1 through stage 4 chronic kidney disease, or unspecified chronic kidney disease: Secondary | ICD-10-CM | POA: Diagnosis not present

## 2019-11-07 DIAGNOSIS — I6529 Occlusion and stenosis of unspecified carotid artery: Secondary | ICD-10-CM | POA: Diagnosis not present

## 2019-11-07 DIAGNOSIS — E1122 Type 2 diabetes mellitus with diabetic chronic kidney disease: Secondary | ICD-10-CM | POA: Diagnosis not present

## 2019-11-07 DIAGNOSIS — L565 Disseminated superficial actinic porokeratosis (DSAP): Secondary | ICD-10-CM | POA: Diagnosis not present

## 2019-11-07 DIAGNOSIS — I5032 Chronic diastolic (congestive) heart failure: Secondary | ICD-10-CM | POA: Diagnosis not present

## 2019-11-07 DIAGNOSIS — N1832 Chronic kidney disease, stage 3b: Secondary | ICD-10-CM | POA: Diagnosis not present

## 2019-11-07 DIAGNOSIS — I482 Chronic atrial fibrillation, unspecified: Secondary | ICD-10-CM | POA: Diagnosis not present

## 2019-11-07 DIAGNOSIS — N179 Acute kidney failure, unspecified: Secondary | ICD-10-CM | POA: Diagnosis not present

## 2019-11-07 DIAGNOSIS — I2510 Atherosclerotic heart disease of native coronary artery without angina pectoris: Secondary | ICD-10-CM | POA: Diagnosis not present

## 2019-11-07 NOTE — Telephone Encounter (Signed)
Verbal order given to [facility] with [facility]

## 2019-11-07 NOTE — Telephone Encounter (Signed)
That's fine

## 2019-11-07 NOTE — Telephone Encounter (Signed)
Home Health Verbal Orders - Caller/Agency:  Erline Levine with [facility] Number: [phone]  Requesting OT/PT/Skilled Nursing/Social Work/Speech Therapy: Verbal approval for PT 2 week 5 and 1 week 3.

## 2019-11-07 NOTE — Telephone Encounter (Signed)
Verbal order given to [facility] with [facility].

## 2019-11-08 DIAGNOSIS — I2510 Atherosclerotic heart disease of native coronary artery without angina pectoris: Secondary | ICD-10-CM | POA: Diagnosis not present

## 2019-11-08 DIAGNOSIS — I130 Hypertensive heart and chronic kidney disease with heart failure and stage 1 through stage 4 chronic kidney disease, or unspecified chronic kidney disease: Secondary | ICD-10-CM | POA: Diagnosis not present

## 2019-11-08 DIAGNOSIS — L97212 Non-pressure chronic ulcer of right calf with fat layer exposed: Secondary | ICD-10-CM | POA: Diagnosis not present

## 2019-11-08 DIAGNOSIS — S91109A Unspecified open wound of unspecified toe(s) without damage to nail, initial encounter: Secondary | ICD-10-CM | POA: Diagnosis not present

## 2019-11-08 DIAGNOSIS — L565 Disseminated superficial actinic porokeratosis (DSAP): Secondary | ICD-10-CM | POA: Diagnosis not present

## 2019-11-08 DIAGNOSIS — I5032 Chronic diastolic (congestive) heart failure: Secondary | ICD-10-CM | POA: Diagnosis not present

## 2019-11-08 DIAGNOSIS — N179 Acute kidney failure, unspecified: Secondary | ICD-10-CM | POA: Diagnosis not present

## 2019-11-08 DIAGNOSIS — I6529 Occlusion and stenosis of unspecified carotid artery: Secondary | ICD-10-CM | POA: Diagnosis not present

## 2019-11-08 DIAGNOSIS — I482 Chronic atrial fibrillation, unspecified: Secondary | ICD-10-CM | POA: Diagnosis not present

## 2019-11-08 DIAGNOSIS — N1832 Chronic kidney disease, stage 3b: Secondary | ICD-10-CM | POA: Diagnosis not present

## 2019-11-08 DIAGNOSIS — I6782 Cerebral ischemia: Secondary | ICD-10-CM | POA: Diagnosis not present

## 2019-11-08 DIAGNOSIS — L97112 Non-pressure chronic ulcer of right thigh with fat layer exposed: Secondary | ICD-10-CM | POA: Diagnosis not present

## 2019-11-08 DIAGNOSIS — E1122 Type 2 diabetes mellitus with diabetic chronic kidney disease: Secondary | ICD-10-CM | POA: Diagnosis not present

## 2019-11-10 DIAGNOSIS — S91109A Unspecified open wound of unspecified toe(s) without damage to nail, initial encounter: Secondary | ICD-10-CM | POA: Diagnosis not present

## 2019-11-10 DIAGNOSIS — L97112 Non-pressure chronic ulcer of right thigh with fat layer exposed: Secondary | ICD-10-CM | POA: Diagnosis not present

## 2019-11-10 DIAGNOSIS — L97212 Non-pressure chronic ulcer of right calf with fat layer exposed: Secondary | ICD-10-CM | POA: Diagnosis not present

## 2019-11-10 MED ORDER — CONTOUR NEXT TEST VI STRP: ORAL_STRIP | 12 refills | Status: DC

## 2019-11-11 NOTE — Telephone Encounter (Signed)
Pt calling stating that the pharmacy is not able to fill this because of a missing diagnosis code.  Please advise.

## 2019-11-14 ENCOUNTER — Inpatient Hospital Stay: Payer: Medicare HMO | Admitting: Family Medicine

## 2019-11-14 ENCOUNTER — Other Ambulatory Visit: Payer: Self-pay | Admitting: Family Medicine

## 2019-11-14 DIAGNOSIS — E11628 Type 2 diabetes mellitus with other skin complications: Secondary | ICD-10-CM

## 2019-11-14 MED ORDER — CONTOUR NEXT TEST VI STRP: ORAL_STRIP | 12 refills | Status: DC

## 2019-11-15 ENCOUNTER — Other Ambulatory Visit: Payer: Self-pay | Admitting: Family Medicine

## 2019-11-15 ENCOUNTER — Telehealth: Payer: Self-pay

## 2019-11-15 DIAGNOSIS — I2510 Atherosclerotic heart disease of native coronary artery without angina pectoris: Secondary | ICD-10-CM | POA: Diagnosis not present

## 2019-11-15 DIAGNOSIS — L03115 Cellulitis of right lower limb: Secondary | ICD-10-CM

## 2019-11-15 DIAGNOSIS — N179 Acute kidney failure, unspecified: Secondary | ICD-10-CM | POA: Diagnosis not present

## 2019-11-15 DIAGNOSIS — L565 Disseminated superficial actinic porokeratosis (DSAP): Secondary | ICD-10-CM | POA: Diagnosis not present

## 2019-11-15 DIAGNOSIS — I130 Hypertensive heart and chronic kidney disease with heart failure and stage 1 through stage 4 chronic kidney disease, or unspecified chronic kidney disease: Secondary | ICD-10-CM | POA: Diagnosis not present

## 2019-11-15 DIAGNOSIS — I6782 Cerebral ischemia: Secondary | ICD-10-CM | POA: Diagnosis not present

## 2019-11-15 DIAGNOSIS — I482 Chronic atrial fibrillation, unspecified: Secondary | ICD-10-CM | POA: Diagnosis not present

## 2019-11-15 DIAGNOSIS — E1122 Type 2 diabetes mellitus with diabetic chronic kidney disease: Secondary | ICD-10-CM | POA: Diagnosis not present

## 2019-11-15 DIAGNOSIS — I6529 Occlusion and stenosis of unspecified carotid artery: Secondary | ICD-10-CM | POA: Diagnosis not present

## 2019-11-15 DIAGNOSIS — N1832 Chronic kidney disease, stage 3b: Secondary | ICD-10-CM | POA: Diagnosis not present

## 2019-11-15 DIAGNOSIS — I5032 Chronic diastolic (congestive) heart failure: Secondary | ICD-10-CM | POA: Diagnosis not present

## 2019-11-15 MED ORDER — AMOXICILLIN-POT CLAVULANATE 875-125 MG PO TABS: 1.0000 | ORAL_TABLET | Freq: Two times a day (BID) | ORAL | 0 refills | Status: AC

## 2019-11-15 NOTE — Telephone Encounter (Signed)
Pt states Dr Caryn Section knows what his legs are doing and what they look like.  Pt states they are beginning to look pretty bad. Pt requesting  1. medicated bandages (as he has done in the past) large 4X6 2.  abx for his legs (pt states dr Caryn Section has done this in the past as well)  advised pt nurse may need to be the one to order this, he verbalized understanding.  [place], [place] Phone:  [phone]  Fax:  [phone]

## 2019-11-15 NOTE — Telephone Encounter (Signed)
That's fine. thanks

## 2019-11-15 NOTE — Telephone Encounter (Signed)
Patient advised and verbalized understanding 

## 2019-11-15 NOTE — Telephone Encounter (Signed)
Have sent prescription augmenting to [facility] for cellulitis.  I'm not sure what he means by medicated bandages. Anything we use is OTC, usually Vaseline gauze.

## 2019-11-15 NOTE — Telephone Encounter (Signed)
Copied from [place] [id number]. Topic: Quick Communication - Home Health Verbal Orders >> Nov 14, 2019  2:27 PM Lise Auer wrote: Caller/Agency: [facility] Number: [phone] Requesting OT/PT/Skilled Nursing/Social Work/Speech Therapy:(Home Health) Patient has ulcers on his left leg and nurse wants to know if she can put some silver alginate foam on it for 2 times a week until it heals. Frequency: 2 times a week until it heals

## 2019-11-15 NOTE — Telephone Encounter (Signed)
Please advise. Do you know what patient is referring to, or should I call to try getting more information?

## 2019-11-16 DIAGNOSIS — I130 Hypertensive heart and chronic kidney disease with heart failure and stage 1 through stage 4 chronic kidney disease, or unspecified chronic kidney disease: Secondary | ICD-10-CM | POA: Diagnosis not present

## 2019-11-16 DIAGNOSIS — I482 Chronic atrial fibrillation, unspecified: Secondary | ICD-10-CM | POA: Diagnosis not present

## 2019-11-16 DIAGNOSIS — N1832 Chronic kidney disease, stage 3b: Secondary | ICD-10-CM | POA: Diagnosis not present

## 2019-11-16 DIAGNOSIS — I2510 Atherosclerotic heart disease of native coronary artery without angina pectoris: Secondary | ICD-10-CM | POA: Diagnosis not present

## 2019-11-16 DIAGNOSIS — E1122 Type 2 diabetes mellitus with diabetic chronic kidney disease: Secondary | ICD-10-CM | POA: Diagnosis not present

## 2019-11-16 DIAGNOSIS — L565 Disseminated superficial actinic porokeratosis (DSAP): Secondary | ICD-10-CM | POA: Diagnosis not present

## 2019-11-16 DIAGNOSIS — I6529 Occlusion and stenosis of unspecified carotid artery: Secondary | ICD-10-CM | POA: Diagnosis not present

## 2019-11-16 DIAGNOSIS — N179 Acute kidney failure, unspecified: Secondary | ICD-10-CM | POA: Diagnosis not present

## 2019-11-16 DIAGNOSIS — I5032 Chronic diastolic (congestive) heart failure: Secondary | ICD-10-CM | POA: Diagnosis not present

## 2019-11-16 DIAGNOSIS — I6782 Cerebral ischemia: Secondary | ICD-10-CM | POA: Diagnosis not present

## 2019-11-16 NOTE — Telephone Encounter (Signed)
Verbal orders given  

## 2019-11-17 DIAGNOSIS — L97112 Non-pressure chronic ulcer of right thigh with fat layer exposed: Secondary | ICD-10-CM | POA: Diagnosis not present

## 2019-11-17 DIAGNOSIS — L97212 Non-pressure chronic ulcer of right calf with fat layer exposed: Secondary | ICD-10-CM | POA: Diagnosis not present

## 2019-11-17 DIAGNOSIS — S91109A Unspecified open wound of unspecified toe(s) without damage to nail, initial encounter: Secondary | ICD-10-CM | POA: Diagnosis not present

## 2019-11-17 DIAGNOSIS — L89312 Pressure ulcer of right buttock, stage 2: Secondary | ICD-10-CM | POA: Diagnosis not present

## 2019-11-18 DIAGNOSIS — N179 Acute kidney failure, unspecified: Secondary | ICD-10-CM | POA: Diagnosis not present

## 2019-11-18 DIAGNOSIS — N1832 Chronic kidney disease, stage 3b: Secondary | ICD-10-CM | POA: Diagnosis not present

## 2019-11-18 DIAGNOSIS — I6782 Cerebral ischemia: Secondary | ICD-10-CM | POA: Diagnosis not present

## 2019-11-18 DIAGNOSIS — I130 Hypertensive heart and chronic kidney disease with heart failure and stage 1 through stage 4 chronic kidney disease, or unspecified chronic kidney disease: Secondary | ICD-10-CM | POA: Diagnosis not present

## 2019-11-18 DIAGNOSIS — I2510 Atherosclerotic heart disease of native coronary artery without angina pectoris: Secondary | ICD-10-CM | POA: Diagnosis not present

## 2019-11-18 DIAGNOSIS — I6529 Occlusion and stenosis of unspecified carotid artery: Secondary | ICD-10-CM | POA: Diagnosis not present

## 2019-11-18 DIAGNOSIS — I482 Chronic atrial fibrillation, unspecified: Secondary | ICD-10-CM | POA: Diagnosis not present

## 2019-11-18 DIAGNOSIS — L565 Disseminated superficial actinic porokeratosis (DSAP): Secondary | ICD-10-CM | POA: Diagnosis not present

## 2019-11-18 DIAGNOSIS — I5032 Chronic diastolic (congestive) heart failure: Secondary | ICD-10-CM | POA: Diagnosis not present

## 2019-11-18 DIAGNOSIS — E1122 Type 2 diabetes mellitus with diabetic chronic kidney disease: Secondary | ICD-10-CM | POA: Diagnosis not present

## 2019-11-21 DIAGNOSIS — I2510 Atherosclerotic heart disease of native coronary artery without angina pectoris: Secondary | ICD-10-CM | POA: Diagnosis not present

## 2019-11-21 DIAGNOSIS — N179 Acute kidney failure, unspecified: Secondary | ICD-10-CM | POA: Diagnosis not present

## 2019-11-21 DIAGNOSIS — I5032 Chronic diastolic (congestive) heart failure: Secondary | ICD-10-CM | POA: Diagnosis not present

## 2019-11-21 DIAGNOSIS — I482 Chronic atrial fibrillation, unspecified: Secondary | ICD-10-CM | POA: Diagnosis not present

## 2019-11-21 DIAGNOSIS — I130 Hypertensive heart and chronic kidney disease with heart failure and stage 1 through stage 4 chronic kidney disease, or unspecified chronic kidney disease: Secondary | ICD-10-CM | POA: Diagnosis not present

## 2019-11-21 DIAGNOSIS — I6782 Cerebral ischemia: Secondary | ICD-10-CM | POA: Diagnosis not present

## 2019-11-21 DIAGNOSIS — E1122 Type 2 diabetes mellitus with diabetic chronic kidney disease: Secondary | ICD-10-CM | POA: Diagnosis not present

## 2019-11-21 DIAGNOSIS — I6529 Occlusion and stenosis of unspecified carotid artery: Secondary | ICD-10-CM | POA: Diagnosis not present

## 2019-11-21 DIAGNOSIS — L565 Disseminated superficial actinic porokeratosis (DSAP): Secondary | ICD-10-CM | POA: Diagnosis not present

## 2019-11-21 DIAGNOSIS — N1832 Chronic kidney disease, stage 3b: Secondary | ICD-10-CM | POA: Diagnosis not present

## 2019-11-23 DIAGNOSIS — I6782 Cerebral ischemia: Secondary | ICD-10-CM | POA: Diagnosis not present

## 2019-11-23 DIAGNOSIS — I5032 Chronic diastolic (congestive) heart failure: Secondary | ICD-10-CM | POA: Diagnosis not present

## 2019-11-23 DIAGNOSIS — L565 Disseminated superficial actinic porokeratosis (DSAP): Secondary | ICD-10-CM | POA: Diagnosis not present

## 2019-11-23 DIAGNOSIS — N179 Acute kidney failure, unspecified: Secondary | ICD-10-CM | POA: Diagnosis not present

## 2019-11-23 DIAGNOSIS — I130 Hypertensive heart and chronic kidney disease with heart failure and stage 1 through stage 4 chronic kidney disease, or unspecified chronic kidney disease: Secondary | ICD-10-CM | POA: Diagnosis not present

## 2019-11-23 DIAGNOSIS — I6529 Occlusion and stenosis of unspecified carotid artery: Secondary | ICD-10-CM | POA: Diagnosis not present

## 2019-11-23 DIAGNOSIS — I2510 Atherosclerotic heart disease of native coronary artery without angina pectoris: Secondary | ICD-10-CM | POA: Diagnosis not present

## 2019-11-23 DIAGNOSIS — I482 Chronic atrial fibrillation, unspecified: Secondary | ICD-10-CM | POA: Diagnosis not present

## 2019-11-23 DIAGNOSIS — N1832 Chronic kidney disease, stage 3b: Secondary | ICD-10-CM | POA: Diagnosis not present

## 2019-11-23 DIAGNOSIS — E1122 Type 2 diabetes mellitus with diabetic chronic kidney disease: Secondary | ICD-10-CM | POA: Diagnosis not present

## 2019-11-24 DIAGNOSIS — L97212 Non-pressure chronic ulcer of right calf with fat layer exposed: Secondary | ICD-10-CM | POA: Diagnosis not present

## 2019-11-24 DIAGNOSIS — L97112 Non-pressure chronic ulcer of right thigh with fat layer exposed: Secondary | ICD-10-CM | POA: Diagnosis not present

## 2019-11-24 DIAGNOSIS — L89312 Pressure ulcer of right buttock, stage 2: Secondary | ICD-10-CM | POA: Diagnosis not present

## 2019-11-24 DIAGNOSIS — S91109A Unspecified open wound of unspecified toe(s) without damage to nail, initial encounter: Secondary | ICD-10-CM | POA: Diagnosis not present

## 2019-11-25 DIAGNOSIS — I5032 Chronic diastolic (congestive) heart failure: Secondary | ICD-10-CM | POA: Diagnosis not present

## 2019-11-25 DIAGNOSIS — L565 Disseminated superficial actinic porokeratosis (DSAP): Secondary | ICD-10-CM | POA: Diagnosis not present

## 2019-11-25 DIAGNOSIS — I6782 Cerebral ischemia: Secondary | ICD-10-CM | POA: Diagnosis not present

## 2019-11-25 DIAGNOSIS — N1832 Chronic kidney disease, stage 3b: Secondary | ICD-10-CM | POA: Diagnosis not present

## 2019-11-25 DIAGNOSIS — I482 Chronic atrial fibrillation, unspecified: Secondary | ICD-10-CM | POA: Diagnosis not present

## 2019-11-25 DIAGNOSIS — I130 Hypertensive heart and chronic kidney disease with heart failure and stage 1 through stage 4 chronic kidney disease, or unspecified chronic kidney disease: Secondary | ICD-10-CM | POA: Diagnosis not present

## 2019-11-25 DIAGNOSIS — N179 Acute kidney failure, unspecified: Secondary | ICD-10-CM | POA: Diagnosis not present

## 2019-11-25 DIAGNOSIS — E1122 Type 2 diabetes mellitus with diabetic chronic kidney disease: Secondary | ICD-10-CM | POA: Diagnosis not present

## 2019-11-25 DIAGNOSIS — I6529 Occlusion and stenosis of unspecified carotid artery: Secondary | ICD-10-CM | POA: Diagnosis not present

## 2019-11-25 DIAGNOSIS — I2510 Atherosclerotic heart disease of native coronary artery without angina pectoris: Secondary | ICD-10-CM | POA: Diagnosis not present

## 2019-11-28 DIAGNOSIS — L89312 Pressure ulcer of right buttock, stage 2: Secondary | ICD-10-CM | POA: Diagnosis not present

## 2019-11-28 DIAGNOSIS — I130 Hypertensive heart and chronic kidney disease with heart failure and stage 1 through stage 4 chronic kidney disease, or unspecified chronic kidney disease: Secondary | ICD-10-CM | POA: Diagnosis not present

## 2019-11-28 DIAGNOSIS — N179 Acute kidney failure, unspecified: Secondary | ICD-10-CM | POA: Diagnosis not present

## 2019-11-28 DIAGNOSIS — I6529 Occlusion and stenosis of unspecified carotid artery: Secondary | ICD-10-CM | POA: Diagnosis not present

## 2019-11-28 DIAGNOSIS — S91109A Unspecified open wound of unspecified toe(s) without damage to nail, initial encounter: Secondary | ICD-10-CM | POA: Diagnosis not present

## 2019-11-28 DIAGNOSIS — E1122 Type 2 diabetes mellitus with diabetic chronic kidney disease: Secondary | ICD-10-CM | POA: Diagnosis not present

## 2019-11-28 DIAGNOSIS — L565 Disseminated superficial actinic porokeratosis (DSAP): Secondary | ICD-10-CM | POA: Diagnosis not present

## 2019-11-28 DIAGNOSIS — L97212 Non-pressure chronic ulcer of right calf with fat layer exposed: Secondary | ICD-10-CM | POA: Diagnosis not present

## 2019-11-28 DIAGNOSIS — L89322 Pressure ulcer of left buttock, stage 2: Secondary | ICD-10-CM | POA: Diagnosis not present

## 2019-11-28 DIAGNOSIS — I5032 Chronic diastolic (congestive) heart failure: Secondary | ICD-10-CM | POA: Diagnosis not present

## 2019-11-28 DIAGNOSIS — I482 Chronic atrial fibrillation, unspecified: Secondary | ICD-10-CM | POA: Diagnosis not present

## 2019-11-28 DIAGNOSIS — I2510 Atherosclerotic heart disease of native coronary artery without angina pectoris: Secondary | ICD-10-CM | POA: Diagnosis not present

## 2019-11-28 DIAGNOSIS — L97112 Non-pressure chronic ulcer of right thigh with fat layer exposed: Secondary | ICD-10-CM | POA: Diagnosis not present

## 2019-11-28 DIAGNOSIS — N1832 Chronic kidney disease, stage 3b: Secondary | ICD-10-CM | POA: Diagnosis not present

## 2019-11-28 DIAGNOSIS — I6782 Cerebral ischemia: Secondary | ICD-10-CM | POA: Diagnosis not present

## 2019-11-30 ENCOUNTER — Telehealth: Payer: Self-pay | Admitting: Family Medicine

## 2019-11-30 DIAGNOSIS — E1122 Type 2 diabetes mellitus with diabetic chronic kidney disease: Secondary | ICD-10-CM | POA: Diagnosis not present

## 2019-11-30 DIAGNOSIS — I5032 Chronic diastolic (congestive) heart failure: Secondary | ICD-10-CM | POA: Diagnosis not present

## 2019-11-30 DIAGNOSIS — N1832 Chronic kidney disease, stage 3b: Secondary | ICD-10-CM | POA: Diagnosis not present

## 2019-11-30 DIAGNOSIS — L565 Disseminated superficial actinic porokeratosis (DSAP): Secondary | ICD-10-CM | POA: Diagnosis not present

## 2019-11-30 DIAGNOSIS — I6529 Occlusion and stenosis of unspecified carotid artery: Secondary | ICD-10-CM | POA: Diagnosis not present

## 2019-11-30 DIAGNOSIS — I130 Hypertensive heart and chronic kidney disease with heart failure and stage 1 through stage 4 chronic kidney disease, or unspecified chronic kidney disease: Secondary | ICD-10-CM | POA: Diagnosis not present

## 2019-11-30 DIAGNOSIS — N179 Acute kidney failure, unspecified: Secondary | ICD-10-CM | POA: Diagnosis not present

## 2019-11-30 DIAGNOSIS — I2510 Atherosclerotic heart disease of native coronary artery without angina pectoris: Secondary | ICD-10-CM | POA: Diagnosis not present

## 2019-11-30 DIAGNOSIS — I6782 Cerebral ischemia: Secondary | ICD-10-CM | POA: Diagnosis not present

## 2019-11-30 DIAGNOSIS — I482 Chronic atrial fibrillation, unspecified: Secondary | ICD-10-CM | POA: Diagnosis not present

## 2019-11-30 NOTE — Telephone Encounter (Signed)
Christian Berg called to report a fall / Pt had a fall on 10.17.21 / Pt fell on his face and had a nose bleed and an abrasion on his right hand and complaint of right side pain/ pt declined EMS when asked if he wanted to go to the hospital/ Christian Berg didn't realized the fall had not been reported until he seen pt recently

## 2019-12-01 DIAGNOSIS — I2510 Atherosclerotic heart disease of native coronary artery without angina pectoris: Secondary | ICD-10-CM | POA: Diagnosis not present

## 2019-12-01 DIAGNOSIS — I482 Chronic atrial fibrillation, unspecified: Secondary | ICD-10-CM | POA: Diagnosis not present

## 2019-12-01 DIAGNOSIS — I5032 Chronic diastolic (congestive) heart failure: Secondary | ICD-10-CM | POA: Diagnosis not present

## 2019-12-01 DIAGNOSIS — L565 Disseminated superficial actinic porokeratosis (DSAP): Secondary | ICD-10-CM | POA: Diagnosis not present

## 2019-12-01 DIAGNOSIS — E1122 Type 2 diabetes mellitus with diabetic chronic kidney disease: Secondary | ICD-10-CM | POA: Diagnosis not present

## 2019-12-01 DIAGNOSIS — I6782 Cerebral ischemia: Secondary | ICD-10-CM | POA: Diagnosis not present

## 2019-12-01 DIAGNOSIS — N1832 Chronic kidney disease, stage 3b: Secondary | ICD-10-CM | POA: Diagnosis not present

## 2019-12-01 DIAGNOSIS — N179 Acute kidney failure, unspecified: Secondary | ICD-10-CM | POA: Diagnosis not present

## 2019-12-01 DIAGNOSIS — I6529 Occlusion and stenosis of unspecified carotid artery: Secondary | ICD-10-CM | POA: Diagnosis not present

## 2019-12-01 DIAGNOSIS — I130 Hypertensive heart and chronic kidney disease with heart failure and stage 1 through stage 4 chronic kidney disease, or unspecified chronic kidney disease: Secondary | ICD-10-CM | POA: Diagnosis not present

## 2019-12-05 DIAGNOSIS — I6782 Cerebral ischemia: Secondary | ICD-10-CM | POA: Diagnosis not present

## 2019-12-05 DIAGNOSIS — E1122 Type 2 diabetes mellitus with diabetic chronic kidney disease: Secondary | ICD-10-CM | POA: Diagnosis not present

## 2019-12-05 DIAGNOSIS — N179 Acute kidney failure, unspecified: Secondary | ICD-10-CM | POA: Diagnosis not present

## 2019-12-05 DIAGNOSIS — L565 Disseminated superficial actinic porokeratosis (DSAP): Secondary | ICD-10-CM | POA: Diagnosis not present

## 2019-12-05 DIAGNOSIS — I6529 Occlusion and stenosis of unspecified carotid artery: Secondary | ICD-10-CM | POA: Diagnosis not present

## 2019-12-05 DIAGNOSIS — N1832 Chronic kidney disease, stage 3b: Secondary | ICD-10-CM | POA: Diagnosis not present

## 2019-12-05 DIAGNOSIS — I130 Hypertensive heart and chronic kidney disease with heart failure and stage 1 through stage 4 chronic kidney disease, or unspecified chronic kidney disease: Secondary | ICD-10-CM | POA: Diagnosis not present

## 2019-12-05 DIAGNOSIS — I5032 Chronic diastolic (congestive) heart failure: Secondary | ICD-10-CM | POA: Diagnosis not present

## 2019-12-05 DIAGNOSIS — I2510 Atherosclerotic heart disease of native coronary artery without angina pectoris: Secondary | ICD-10-CM | POA: Diagnosis not present

## 2019-12-05 DIAGNOSIS — I482 Chronic atrial fibrillation, unspecified: Secondary | ICD-10-CM | POA: Diagnosis not present

## 2019-12-06 ENCOUNTER — Ambulatory Visit: Payer: Self-pay | Admitting: Family Medicine

## 2019-12-06 NOTE — Telephone Encounter (Signed)
Pt reports scrotal swelling, onset 2-1/2 weeks ago. States "Twice the size, entire scrotum, very tight." Sudden onset "Has been that swollen for 2-1/2 weeks."  8/10 pain, constant. Pt states he is in chair most of the time, transfers to [facility]. Denies edema elsewhere, denies abdominal pain, no fever. Reports scrotum is red and warm to touch. Has tried elevating scrotum, ineffective. Advised ED, pt declines. "Please see if Dr. Caryn Section thinks an antibiotic will work for this." Assured pt I would route to practice for Dr. Maralyn Sago review. Again reiterated need for ED eval.   Please advise: [phone]  Reason for Disposition  Scrotum is painful or tender to touch  Answer Assessment - Initial Assessment Questions 1. SCROTAL SWELLING: "What does the scrotum look like?" "How swollen is it?" (mild, moderate severe; compare to other side)     Both sides, twice the size 2. LOCATION: "Where is the swelling located?"     Entire scrotum 3. ONSET: "When did the swelling start?"     2 -1/2 weeks ago 4. PATTERN: "Does it come and go, or has it been constant since it started?"     Constant 5. SCROTAL PAIN: "Is there any pain?" If Yes, ask: "How bad is it?"  (Scale 1-10; or mild, moderate, severe)     8/10 6. HERNIA: "Has a doctor ever told you that you have a hernia?"     no 7. OTHER SYMPTOMS: "Do you have any other symptoms?" (e.g., fever, abdominal pain, vomiting, difficulty passing urine)     no  Protocols used: SCROTUM SWELLING-A-AH

## 2019-12-06 NOTE — Telephone Encounter (Signed)
Left message to call back. OK for [facility] to advise as below.

## 2019-12-06 NOTE — Telephone Encounter (Signed)
It was recommended that pt go to the ER for scrotal swelling, but patient wants to know if you think an abx would treat this? Please advise.

## 2019-12-06 NOTE — Telephone Encounter (Signed)
Probably not. He needs to go to the ER.

## 2019-12-07 DIAGNOSIS — I6529 Occlusion and stenosis of unspecified carotid artery: Secondary | ICD-10-CM | POA: Diagnosis not present

## 2019-12-07 DIAGNOSIS — I482 Chronic atrial fibrillation, unspecified: Secondary | ICD-10-CM | POA: Diagnosis not present

## 2019-12-07 DIAGNOSIS — L565 Disseminated superficial actinic porokeratosis (DSAP): Secondary | ICD-10-CM | POA: Diagnosis not present

## 2019-12-07 DIAGNOSIS — I130 Hypertensive heart and chronic kidney disease with heart failure and stage 1 through stage 4 chronic kidney disease, or unspecified chronic kidney disease: Secondary | ICD-10-CM | POA: Diagnosis not present

## 2019-12-07 DIAGNOSIS — N179 Acute kidney failure, unspecified: Secondary | ICD-10-CM | POA: Diagnosis not present

## 2019-12-07 DIAGNOSIS — I6782 Cerebral ischemia: Secondary | ICD-10-CM | POA: Diagnosis not present

## 2019-12-07 DIAGNOSIS — I5032 Chronic diastolic (congestive) heart failure: Secondary | ICD-10-CM | POA: Diagnosis not present

## 2019-12-07 DIAGNOSIS — N1832 Chronic kidney disease, stage 3b: Secondary | ICD-10-CM | POA: Diagnosis not present

## 2019-12-07 DIAGNOSIS — E1122 Type 2 diabetes mellitus with diabetic chronic kidney disease: Secondary | ICD-10-CM | POA: Diagnosis not present

## 2019-12-07 DIAGNOSIS — I2510 Atherosclerotic heart disease of native coronary artery without angina pectoris: Secondary | ICD-10-CM | POA: Diagnosis not present

## 2019-12-07 NOTE — Telephone Encounter (Signed)
Attempted to contact pt.  Left vm to return call to office to receive recommendations per Dr. Caryn Section.

## 2019-12-08 DIAGNOSIS — L565 Disseminated superficial actinic porokeratosis (DSAP): Secondary | ICD-10-CM | POA: Diagnosis not present

## 2019-12-08 DIAGNOSIS — N1832 Chronic kidney disease, stage 3b: Secondary | ICD-10-CM | POA: Diagnosis not present

## 2019-12-08 DIAGNOSIS — I6782 Cerebral ischemia: Secondary | ICD-10-CM | POA: Diagnosis not present

## 2019-12-08 DIAGNOSIS — I6529 Occlusion and stenosis of unspecified carotid artery: Secondary | ICD-10-CM | POA: Diagnosis not present

## 2019-12-08 DIAGNOSIS — N179 Acute kidney failure, unspecified: Secondary | ICD-10-CM | POA: Diagnosis not present

## 2019-12-08 DIAGNOSIS — I130 Hypertensive heart and chronic kidney disease with heart failure and stage 1 through stage 4 chronic kidney disease, or unspecified chronic kidney disease: Secondary | ICD-10-CM | POA: Diagnosis not present

## 2019-12-08 DIAGNOSIS — E1122 Type 2 diabetes mellitus with diabetic chronic kidney disease: Secondary | ICD-10-CM | POA: Diagnosis not present

## 2019-12-08 DIAGNOSIS — I482 Chronic atrial fibrillation, unspecified: Secondary | ICD-10-CM | POA: Diagnosis not present

## 2019-12-08 DIAGNOSIS — I5032 Chronic diastolic (congestive) heart failure: Secondary | ICD-10-CM | POA: Diagnosis not present

## 2019-12-08 DIAGNOSIS — I2510 Atherosclerotic heart disease of native coronary artery without angina pectoris: Secondary | ICD-10-CM | POA: Diagnosis not present

## 2019-12-09 ENCOUNTER — Other Ambulatory Visit: Payer: Self-pay

## 2019-12-09 ENCOUNTER — Encounter: Payer: Self-pay | Admitting: Family Medicine

## 2019-12-09 ENCOUNTER — Ambulatory Visit (INDEPENDENT_AMBULATORY_CARE_PROVIDER_SITE_OTHER): Payer: Medicare HMO | Admitting: Family Medicine

## 2019-12-09 VITALS — BP 133/62 | HR 99 | Temp 98.6°F | Resp 16

## 2019-12-09 DIAGNOSIS — R0609 Other forms of dyspnea: Secondary | ICD-10-CM

## 2019-12-09 DIAGNOSIS — N1832 Chronic kidney disease, stage 3b: Secondary | ICD-10-CM | POA: Diagnosis not present

## 2019-12-09 DIAGNOSIS — I130 Hypertensive heart and chronic kidney disease with heart failure and stage 1 through stage 4 chronic kidney disease, or unspecified chronic kidney disease: Secondary | ICD-10-CM | POA: Diagnosis not present

## 2019-12-09 DIAGNOSIS — E1122 Type 2 diabetes mellitus with diabetic chronic kidney disease: Secondary | ICD-10-CM | POA: Diagnosis not present

## 2019-12-09 DIAGNOSIS — I482 Chronic atrial fibrillation, unspecified: Secondary | ICD-10-CM

## 2019-12-09 DIAGNOSIS — I2510 Atherosclerotic heart disease of native coronary artery without angina pectoris: Secondary | ICD-10-CM | POA: Diagnosis not present

## 2019-12-09 DIAGNOSIS — I6529 Occlusion and stenosis of unspecified carotid artery: Secondary | ICD-10-CM | POA: Diagnosis not present

## 2019-12-09 DIAGNOSIS — R0600 Dyspnea, unspecified: Secondary | ICD-10-CM | POA: Diagnosis not present

## 2019-12-09 DIAGNOSIS — R601 Generalized edema: Secondary | ICD-10-CM | POA: Diagnosis not present

## 2019-12-09 DIAGNOSIS — L03119 Cellulitis of unspecified part of limb: Secondary | ICD-10-CM | POA: Diagnosis not present

## 2019-12-09 DIAGNOSIS — I5032 Chronic diastolic (congestive) heart failure: Secondary | ICD-10-CM | POA: Diagnosis not present

## 2019-12-09 DIAGNOSIS — L565 Disseminated superficial actinic porokeratosis (DSAP): Secondary | ICD-10-CM | POA: Diagnosis not present

## 2019-12-09 DIAGNOSIS — I6782 Cerebral ischemia: Secondary | ICD-10-CM | POA: Diagnosis not present

## 2019-12-09 DIAGNOSIS — N179 Acute kidney failure, unspecified: Secondary | ICD-10-CM | POA: Diagnosis not present

## 2019-12-09 DIAGNOSIS — I5033 Acute on chronic diastolic (congestive) heart failure: Secondary | ICD-10-CM

## 2019-12-09 LAB — POCT INR
INR: 5.1 — AB (ref 2.0–3.0)
PT: 60.9

## 2019-12-09 MED ORDER — TORSEMIDE 5 MG PO TABS: 5.0000 mg | ORAL_TABLET | Freq: Every day | ORAL | 0 refills | Status: DC

## 2019-12-09 MED ORDER — DOXYCYCLINE HYCLATE 100 MG PO TABS: 100.0000 mg | ORAL_TABLET | Freq: Two times a day (BID) | ORAL | 0 refills | Status: DC

## 2019-12-09 NOTE — Telephone Encounter (Signed)
Pt has an appt today.  

## 2019-12-09 NOTE — Progress Notes (Signed)
Established patient visit   Patient: Christian Berg   DOB: 03-30-1950   69 y.o. Male  MRN: [id number] Visit Date: 12/09/2019  Today's healthcare provider: Lelon Huh, MD   Chief Complaint  Patient presents with  . Atrial Fibrillation  . Hospitalization Follow-up  . Groin Swelling   Subjective    HPI  Follow up Hospitalization  Patient was admitted to [facility] on 10/28/2019 and discharged on 10/31/2019. He was treated for altered mental status. Treatment for this included discontinued Lasix and Lisinopril. Telephone follow up was done on 11/01/2019 He reports fair compliance with treatment. He reports this condition is improved. However he is still extremely fatigued. Is not feeling short of breath at rest, but has very limited activity due to fatigue and does get winded when he gets up or tried to get out of chair.    Patient's wife also mentions that his PT/INR has not been checked in several weeks. He is currently taking Coumadin 4m 3 times weekly.   He is also having scrotal swelling. He can not stand due to the pain. Denies hematuria. He has had symptoms for the last week.   Lab Results  Component Value Date   WBC 6.3 10/31/2019   HGB 9.3 (L) 10/31/2019   HCT 27.5 (L) 10/31/2019   MCV 92.3 10/31/2019   PLT 206 10/31/2019   Lab Results  Component Value Date   NA 136 10/31/2019   K 4.5 10/31/2019   CO2 24 10/31/2019   BUN 27 (H) 10/31/2019   CREATININE 2.09 (H) 10/31/2019   CALCIUM 8.6 (L) 10/31/2019   GLUCOSE 91 10/31/2019   He is also being treated by home health for wound care of open sores on legs which he states are improving since application of dressings and wrapping, but was told by home health nurse that it looks infected around wounds.       Medications: Outpatient Medications Prior to Visit  Medication Sig  . albuterol (VENTOLIN HFA) 108 (90 Base) MCG/ACT inhaler Inhale 2 puffs into the lungs every 6 (six) hours as needed for wheezing or  shortness of breath.  .[name] Kitchenaspirin 81 MG EC tablet Take 81 mg by mouth every evening. (Patient not taking: Reported on 10/28/2019)  . ferrous sulfate 325 (65 FE) MG tablet Take 325 mg by mouth daily with breakfast.  . glucose blood (CONTOUR NEXT TEST) test strip Use to check blood sugar three times daily for insulin dependent diabetes E11.65  . hydrocerin (EUCERIN) CREA Apply 1 application topically daily.  . insulin glargine (LANTUS) 100 UNIT/ML Solostar Pen Inject 10 Units into the skin at bedtime. (Patient not taking: Reported on 10/28/2019)  . ketoconazole (NIZORAL) 2 % cream Apply 1 application topically daily as needed for irritation.  . lovastatin (MEVACOR) 20 MG tablet Take 1 tablet (20 mg total) by mouth every evening.  . metFORMIN (GLUCOPHAGE) 1000 MG tablet Take 1 tablet by mouth twice daily (Patient taking differently: Take 500 mg by mouth 2 (two) times daily with a meal. )  . metoprolol tartrate (LOPRESSOR) 50 MG tablet Take 1 tablet (50 mg total) by mouth 2 (two) times daily.  . midodrine (PROAMATINE) 10 MG tablet Take 1 tablet (10 mg total) by mouth 3 (three) times daily with meals.  . Multiple Vitamin (MULTIVITAMIN WITH MINERALS) TABS tablet Take 1 tablet by mouth daily.  .[name] KitchenNystatin (GERHARDT'S BUTT CREAM) CREA Apply 1 application topically 3 (three) times daily.  . pantoprazole (PROTONIX) 40 MG tablet  Take 1 tablet (40 mg total) by mouth 2 (two) times daily.  . simethicone (MYLICON) 80 MG chewable tablet Chew 1 tablet (80 mg total) by mouth every 6 (six) hours as needed for flatulence (or gas pains).  . traMADol-acetaminophen (ULTRACET) 37.5-325 MG tablet Take 1 tablet by mouth every 6 (six) hours as needed for severe pain.  . traZODone (DESYREL) 50 MG tablet Take 1 tablet (50 mg total) by mouth at bedtime as needed for sleep.   No facility-administered medications prior to visit.    Review of Systems  Constitutional: Positive for fatigue.  Cardiovascular: Negative for chest pain  and leg swelling.  Genitourinary: Positive for scrotal swelling. Negative for difficulty urinating, frequency and hematuria.  Musculoskeletal: Positive for arthralgias and gait problem.       Pt in wheelchair  Skin: Negative for color change, rash and wound.  Neurological: Positive for weakness.  Hematological: Bruises/bleeds easily.      Objective    BP 133/62   Pulse 99   Temp 98.6 F (37 C)   Resp 16    Physical Exam   General: Appearance:    Severely obese male in no acute distress  Eyes:    PERRL, conjunctiva/corneas clear, EOM's intact       Lungs:     Clear to auscultation bilaterally, respirations unlabored  Heart:    Normal heart rate. Irregularly irregular rhythm.  2/6 holosystolic murmur at left lower sternal border  MS:   All extremities are intact. Lower legs thoroughly wrapped. Some edema noted knees and thighs. Moderate scrotal and suprapubic edema notes.   Neurologic:   Awake, alert, oriented x 3. Confined to wheelchair due to leg weakness.         Results for orders placed or performed in visit on 12/09/19  POCT INR  Result Value Ref Range   INR 5.1 (A) 2.0 - 3.0   PT 60.9     Assessment & Plan     1. Chronic atrial fibrillation (HCC) Elevated PT/INR on POC testing. He is only taking warfarin 67m on TTh and Saturday. [place] lab results are back.  - PT and PTT  2. Anasarca Has been off of diuretics since discharge last month, but now clearly fluid overloaded. He doesn't want to go back on 281mfurosemide due to difficulty getting to rest room. Will change to low dose- torsemide (DEMADEX) 5 MG tablet; Take 1 tablet (5 mg total) by mouth daily.  Dispense: 30 tablet; Refill: 0. Expect he will need to titrate up as he starts to diurese, but will need to closely follow kidney functions and electrolytes.   3. Acute on chronic heart failure with preserved ejection fraction (HCC)  - torsemide (DEMADEX) 5 MG  tablet; Take 1 tablet (5 mg total) by mouth daily.  Dispense: 30 tablet; Refill: 0 He has cardiology follow up scheduled in 2 weeks.    4. Dyspnea on exertion  - CBC - Comprehensive metabolic panel - Brain natriuretic peptide  5. Cellulitis of lower extremity, unspecified laterality Leg wounds currently managed by home health and tightly wrapped today.  - doxycycline (VIBRA-TABS) 100 MG tablet; Take 1 tablet (100 mg total) by mouth 2 (two) times daily.  Dispense: 20 tablet; Refill: 0    Addressed extensive list of chronic and acute medical problems today requiring 45 minutes reviewing his medical record, counseling patient regarding his conditions and coordination of care.  The entirety of the information documented in the History of Present Illness, Review of Systems and Physical Exam were personally obtained by me. Portions of this information were initially documented by the CMA and reviewed by me for thoroughness and accuracy.      Lelon Huh, MD  [facility] [phone] (phone) [phone] (fax)  [place]

## 2019-12-10 LAB — CBC
Hematocrit: 28.7 % — ABNORMAL LOW (ref 37.5–51.0)
Hemoglobin: 9.3 g/dL — ABNORMAL LOW (ref 13.0–17.7)
MCH: 30.8 pg (ref 26.6–33.0)
MCHC: 32.4 g/dL (ref 31.5–35.7)
MCV: 95 fL (ref 79–97)
Platelets: 257 10*3/uL (ref 150–450)
RBC: 3.02 x10E6/uL — ABNORMAL LOW (ref 4.14–5.80)
RDW: 15.9 % — ABNORMAL HIGH (ref 11.6–15.4)
WBC: 5.6 10*3/uL (ref 3.4–10.8)

## 2019-12-10 LAB — BRAIN NATRIURETIC PEPTIDE: BNP: 525.7 pg/mL — ABNORMAL HIGH (ref 0.0–100.0)

## 2019-12-10 LAB — COMPREHENSIVE METABOLIC PANEL
ALT: 16 IU/L (ref 0–44)
AST: 48 IU/L — ABNORMAL HIGH (ref 0–40)
Albumin/Globulin Ratio: 0.6 — ABNORMAL LOW (ref 1.2–2.2)
Albumin: 2.5 g/dL — ABNORMAL LOW (ref 3.8–4.8)
Alkaline Phosphatase: 194 IU/L — ABNORMAL HIGH (ref 44–121)
BUN/Creatinine Ratio: 11 (ref 10–24)
BUN: 15 mg/dL (ref 8–27)
Bilirubin Total: 1.2 mg/dL (ref 0.0–1.2)
CO2: 24 mmol/L (ref 20–29)
Calcium: 8.3 mg/dL — ABNORMAL LOW (ref 8.6–10.2)
Chloride: 102 mmol/L (ref 96–106)
Creatinine, Ser: 1.33 mg/dL — ABNORMAL HIGH (ref 0.76–1.27)
GFR calc Af Amer: 62 mL/min/{1.73_m2} (ref >59)
GFR calc non Af Amer: 54 mL/min/{1.73_m2} — ABNORMAL LOW (ref >59)
Globulin, Total: 4.1 g/dL (ref 1.5–4.5)
Glucose: 118 mg/dL — ABNORMAL HIGH (ref 65–99)
Potassium: 4.6 mmol/L (ref 3.5–5.2)
Sodium: 136 mmol/L (ref 134–144)
Total Protein: 6.6 g/dL (ref 6.0–8.5)

## 2019-12-10 LAB — PT AND PTT
INR: 4.3 — ABNORMAL HIGH (ref 0.9–1.2)
Prothrombin Time: 43 s — ABNORMAL HIGH (ref 9.1–12.0)
aPTT: 62 s (ref 24–33)

## 2019-12-12 DIAGNOSIS — L565 Disseminated superficial actinic porokeratosis (DSAP): Secondary | ICD-10-CM | POA: Diagnosis not present

## 2019-12-12 DIAGNOSIS — I2510 Atherosclerotic heart disease of native coronary artery without angina pectoris: Secondary | ICD-10-CM | POA: Diagnosis not present

## 2019-12-12 DIAGNOSIS — I5032 Chronic diastolic (congestive) heart failure: Secondary | ICD-10-CM | POA: Diagnosis not present

## 2019-12-12 DIAGNOSIS — I130 Hypertensive heart and chronic kidney disease with heart failure and stage 1 through stage 4 chronic kidney disease, or unspecified chronic kidney disease: Secondary | ICD-10-CM | POA: Diagnosis not present

## 2019-12-12 DIAGNOSIS — N1832 Chronic kidney disease, stage 3b: Secondary | ICD-10-CM | POA: Diagnosis not present

## 2019-12-12 DIAGNOSIS — I482 Chronic atrial fibrillation, unspecified: Secondary | ICD-10-CM | POA: Diagnosis not present

## 2019-12-12 DIAGNOSIS — N179 Acute kidney failure, unspecified: Secondary | ICD-10-CM | POA: Diagnosis not present

## 2019-12-12 DIAGNOSIS — E1122 Type 2 diabetes mellitus with diabetic chronic kidney disease: Secondary | ICD-10-CM | POA: Diagnosis not present

## 2019-12-12 DIAGNOSIS — I6782 Cerebral ischemia: Secondary | ICD-10-CM | POA: Diagnosis not present

## 2019-12-12 DIAGNOSIS — I6529 Occlusion and stenosis of unspecified carotid artery: Secondary | ICD-10-CM | POA: Diagnosis not present

## 2019-12-13 ENCOUNTER — Other Ambulatory Visit: Payer: Self-pay | Admitting: Family Medicine

## 2019-12-13 DIAGNOSIS — I482 Chronic atrial fibrillation, unspecified: Secondary | ICD-10-CM

## 2019-12-13 DIAGNOSIS — E1122 Type 2 diabetes mellitus with diabetic chronic kidney disease: Secondary | ICD-10-CM

## 2019-12-13 DIAGNOSIS — L89312 Pressure ulcer of right buttock, stage 2: Secondary | ICD-10-CM | POA: Diagnosis not present

## 2019-12-13 DIAGNOSIS — L97212 Non-pressure chronic ulcer of right calf with fat layer exposed: Secondary | ICD-10-CM | POA: Diagnosis not present

## 2019-12-13 DIAGNOSIS — N183 Chronic kidney disease, stage 3 unspecified: Secondary | ICD-10-CM

## 2019-12-13 DIAGNOSIS — L97112 Non-pressure chronic ulcer of right thigh with fat layer exposed: Secondary | ICD-10-CM | POA: Diagnosis not present

## 2019-12-13 DIAGNOSIS — S91109A Unspecified open wound of unspecified toe(s) without damage to nail, initial encounter: Secondary | ICD-10-CM | POA: Diagnosis not present

## 2019-12-13 DIAGNOSIS — I5033 Acute on chronic diastolic (congestive) heart failure: Secondary | ICD-10-CM

## 2019-12-16 DIAGNOSIS — I2510 Atherosclerotic heart disease of native coronary artery without angina pectoris: Secondary | ICD-10-CM | POA: Diagnosis not present

## 2019-12-16 DIAGNOSIS — E1122 Type 2 diabetes mellitus with diabetic chronic kidney disease: Secondary | ICD-10-CM | POA: Diagnosis not present

## 2019-12-16 DIAGNOSIS — I6529 Occlusion and stenosis of unspecified carotid artery: Secondary | ICD-10-CM | POA: Diagnosis not present

## 2019-12-16 DIAGNOSIS — I5032 Chronic diastolic (congestive) heart failure: Secondary | ICD-10-CM | POA: Diagnosis not present

## 2019-12-16 DIAGNOSIS — N1832 Chronic kidney disease, stage 3b: Secondary | ICD-10-CM | POA: Diagnosis not present

## 2019-12-16 DIAGNOSIS — I6782 Cerebral ischemia: Secondary | ICD-10-CM | POA: Diagnosis not present

## 2019-12-16 DIAGNOSIS — I130 Hypertensive heart and chronic kidney disease with heart failure and stage 1 through stage 4 chronic kidney disease, or unspecified chronic kidney disease: Secondary | ICD-10-CM | POA: Diagnosis not present

## 2019-12-16 DIAGNOSIS — L565 Disseminated superficial actinic porokeratosis (DSAP): Secondary | ICD-10-CM | POA: Diagnosis not present

## 2019-12-16 DIAGNOSIS — N179 Acute kidney failure, unspecified: Secondary | ICD-10-CM | POA: Diagnosis not present

## 2019-12-16 DIAGNOSIS — I482 Chronic atrial fibrillation, unspecified: Secondary | ICD-10-CM | POA: Diagnosis not present

## 2019-12-18 DIAGNOSIS — N179 Acute kidney failure, unspecified: Secondary | ICD-10-CM | POA: Diagnosis not present

## 2019-12-18 DIAGNOSIS — I5032 Chronic diastolic (congestive) heart failure: Secondary | ICD-10-CM | POA: Diagnosis not present

## 2019-12-18 DIAGNOSIS — I130 Hypertensive heart and chronic kidney disease with heart failure and stage 1 through stage 4 chronic kidney disease, or unspecified chronic kidney disease: Secondary | ICD-10-CM | POA: Diagnosis not present

## 2019-12-18 DIAGNOSIS — I2510 Atherosclerotic heart disease of native coronary artery without angina pectoris: Secondary | ICD-10-CM | POA: Diagnosis not present

## 2019-12-18 DIAGNOSIS — N1832 Chronic kidney disease, stage 3b: Secondary | ICD-10-CM | POA: Diagnosis not present

## 2019-12-18 DIAGNOSIS — I482 Chronic atrial fibrillation, unspecified: Secondary | ICD-10-CM | POA: Diagnosis not present

## 2019-12-18 DIAGNOSIS — L565 Disseminated superficial actinic porokeratosis (DSAP): Secondary | ICD-10-CM | POA: Diagnosis not present

## 2019-12-18 DIAGNOSIS — I6782 Cerebral ischemia: Secondary | ICD-10-CM | POA: Diagnosis not present

## 2019-12-18 DIAGNOSIS — I6529 Occlusion and stenosis of unspecified carotid artery: Secondary | ICD-10-CM | POA: Diagnosis not present

## 2019-12-18 DIAGNOSIS — E1122 Type 2 diabetes mellitus with diabetic chronic kidney disease: Secondary | ICD-10-CM | POA: Diagnosis not present

## 2019-12-21 ENCOUNTER — Telehealth: Payer: Self-pay | Admitting: Family Medicine

## 2019-12-21 DIAGNOSIS — I6529 Occlusion and stenosis of unspecified carotid artery: Secondary | ICD-10-CM | POA: Diagnosis not present

## 2019-12-21 DIAGNOSIS — L565 Disseminated superficial actinic porokeratosis (DSAP): Secondary | ICD-10-CM | POA: Diagnosis not present

## 2019-12-21 DIAGNOSIS — I482 Chronic atrial fibrillation, unspecified: Secondary | ICD-10-CM | POA: Diagnosis not present

## 2019-12-21 DIAGNOSIS — I6782 Cerebral ischemia: Secondary | ICD-10-CM | POA: Diagnosis not present

## 2019-12-21 DIAGNOSIS — N179 Acute kidney failure, unspecified: Secondary | ICD-10-CM | POA: Diagnosis not present

## 2019-12-21 DIAGNOSIS — E1122 Type 2 diabetes mellitus with diabetic chronic kidney disease: Secondary | ICD-10-CM | POA: Diagnosis not present

## 2019-12-21 DIAGNOSIS — I5032 Chronic diastolic (congestive) heart failure: Secondary | ICD-10-CM | POA: Diagnosis not present

## 2019-12-21 DIAGNOSIS — I2510 Atherosclerotic heart disease of native coronary artery without angina pectoris: Secondary | ICD-10-CM | POA: Diagnosis not present

## 2019-12-21 DIAGNOSIS — I130 Hypertensive heart and chronic kidney disease with heart failure and stage 1 through stage 4 chronic kidney disease, or unspecified chronic kidney disease: Secondary | ICD-10-CM | POA: Diagnosis not present

## 2019-12-21 DIAGNOSIS — N1832 Chronic kidney disease, stage 3b: Secondary | ICD-10-CM | POA: Diagnosis not present

## 2019-12-21 NOTE — Telephone Encounter (Signed)
Please check with patient regarding torsemide that was prescribed last week for swelling. If tolerating then recommend he go up to 23m (2 x 512mtablets) a day. He was also supposed to get PT/INR and renal functions rechecked since warfarin was put on hold. Has he been to lab yet? He can take 1/2 tablet twice a WEEK of warfarin until we can get PT/INR rechecked.

## 2019-12-21 NOTE — Telephone Encounter (Signed)
Patient called and given information below from Dr. Caryn Section, patient verbalized understanding. He says he will see what he can do when advised to go to the office for labs on Monday.

## 2019-12-21 NOTE — Telephone Encounter (Signed)
Tried calling patient. Left message to call back. OK for PEC Triage to advise, then route message back to the office.

## 2019-12-23 DIAGNOSIS — L565 Disseminated superficial actinic porokeratosis (DSAP): Secondary | ICD-10-CM | POA: Diagnosis not present

## 2019-12-23 DIAGNOSIS — N179 Acute kidney failure, unspecified: Secondary | ICD-10-CM | POA: Diagnosis not present

## 2019-12-23 DIAGNOSIS — I482 Chronic atrial fibrillation, unspecified: Secondary | ICD-10-CM | POA: Diagnosis not present

## 2019-12-23 DIAGNOSIS — N1832 Chronic kidney disease, stage 3b: Secondary | ICD-10-CM | POA: Diagnosis not present

## 2019-12-23 DIAGNOSIS — I6782 Cerebral ischemia: Secondary | ICD-10-CM | POA: Diagnosis not present

## 2019-12-23 DIAGNOSIS — E1122 Type 2 diabetes mellitus with diabetic chronic kidney disease: Secondary | ICD-10-CM | POA: Diagnosis not present

## 2019-12-23 DIAGNOSIS — I2510 Atherosclerotic heart disease of native coronary artery without angina pectoris: Secondary | ICD-10-CM | POA: Diagnosis not present

## 2019-12-23 DIAGNOSIS — I130 Hypertensive heart and chronic kidney disease with heart failure and stage 1 through stage 4 chronic kidney disease, or unspecified chronic kidney disease: Secondary | ICD-10-CM | POA: Diagnosis not present

## 2019-12-23 DIAGNOSIS — I6529 Occlusion and stenosis of unspecified carotid artery: Secondary | ICD-10-CM | POA: Diagnosis not present

## 2019-12-23 DIAGNOSIS — I5032 Chronic diastolic (congestive) heart failure: Secondary | ICD-10-CM | POA: Diagnosis not present

## 2019-12-26 ENCOUNTER — Telehealth: Payer: Self-pay | Admitting: Family Medicine

## 2019-12-26 ENCOUNTER — Other Ambulatory Visit: Payer: Self-pay | Admitting: Family Medicine

## 2019-12-26 DIAGNOSIS — L565 Disseminated superficial actinic porokeratosis (DSAP): Secondary | ICD-10-CM | POA: Diagnosis not present

## 2019-12-26 DIAGNOSIS — E1122 Type 2 diabetes mellitus with diabetic chronic kidney disease: Secondary | ICD-10-CM | POA: Diagnosis not present

## 2019-12-26 DIAGNOSIS — I482 Chronic atrial fibrillation, unspecified: Secondary | ICD-10-CM

## 2019-12-26 DIAGNOSIS — I2510 Atherosclerotic heart disease of native coronary artery without angina pectoris: Secondary | ICD-10-CM | POA: Diagnosis not present

## 2019-12-26 DIAGNOSIS — I130 Hypertensive heart and chronic kidney disease with heart failure and stage 1 through stage 4 chronic kidney disease, or unspecified chronic kidney disease: Secondary | ICD-10-CM | POA: Diagnosis not present

## 2019-12-26 DIAGNOSIS — I5033 Acute on chronic diastolic (congestive) heart failure: Secondary | ICD-10-CM

## 2019-12-26 DIAGNOSIS — I5032 Chronic diastolic (congestive) heart failure: Secondary | ICD-10-CM | POA: Diagnosis not present

## 2019-12-26 DIAGNOSIS — N179 Acute kidney failure, unspecified: Secondary | ICD-10-CM | POA: Diagnosis not present

## 2019-12-26 DIAGNOSIS — N1832 Chronic kidney disease, stage 3b: Secondary | ICD-10-CM | POA: Diagnosis not present

## 2019-12-26 DIAGNOSIS — I6782 Cerebral ischemia: Secondary | ICD-10-CM | POA: Diagnosis not present

## 2019-12-26 DIAGNOSIS — I6529 Occlusion and stenosis of unspecified carotid artery: Secondary | ICD-10-CM | POA: Diagnosis not present

## 2019-12-26 NOTE — Telephone Encounter (Signed)
Salli Real PTA from [place] is calling to report that the patient fall on 12/23/19 at 5:30a. The wife reports that the patient has a skin tear to left elbow. EMS treated offer to sent to the hospital. Patient declined. Marcello Moores has not seen the wound. And patient declined the care to [facility] [phone]

## 2019-12-28 ENCOUNTER — Telehealth: Payer: Self-pay

## 2019-12-28 DIAGNOSIS — N179 Acute kidney failure, unspecified: Secondary | ICD-10-CM | POA: Diagnosis not present

## 2019-12-28 DIAGNOSIS — I130 Hypertensive heart and chronic kidney disease with heart failure and stage 1 through stage 4 chronic kidney disease, or unspecified chronic kidney disease: Secondary | ICD-10-CM | POA: Diagnosis not present

## 2019-12-28 DIAGNOSIS — N1832 Chronic kidney disease, stage 3b: Secondary | ICD-10-CM | POA: Diagnosis not present

## 2019-12-28 DIAGNOSIS — I482 Chronic atrial fibrillation, unspecified: Secondary | ICD-10-CM | POA: Diagnosis not present

## 2019-12-28 DIAGNOSIS — E1122 Type 2 diabetes mellitus with diabetic chronic kidney disease: Secondary | ICD-10-CM | POA: Diagnosis not present

## 2019-12-28 DIAGNOSIS — I6782 Cerebral ischemia: Secondary | ICD-10-CM | POA: Diagnosis not present

## 2019-12-28 DIAGNOSIS — I2510 Atherosclerotic heart disease of native coronary artery without angina pectoris: Secondary | ICD-10-CM | POA: Diagnosis not present

## 2019-12-28 DIAGNOSIS — I6529 Occlusion and stenosis of unspecified carotid artery: Secondary | ICD-10-CM | POA: Diagnosis not present

## 2019-12-28 DIAGNOSIS — I5032 Chronic diastolic (congestive) heart failure: Secondary | ICD-10-CM | POA: Diagnosis not present

## 2019-12-28 DIAGNOSIS — L565 Disseminated superficial actinic porokeratosis (DSAP): Secondary | ICD-10-CM | POA: Diagnosis not present

## 2019-12-28 NOTE — Telephone Encounter (Signed)
I spoke with Fenton Malling, PA-C about message below. She advised me that patient needs to go to the ER or urgent care for immediate evaluation and treatment. I called home health physical therapist Gerald Stabs back and advised him of Jennie's recommendation. Gerald Stabs verbalized understanding and agrees to advise patient of the recommendation.

## 2019-12-28 NOTE — Telephone Encounter (Signed)
Copied from [place] [phone]. Topic: Quick Communication - See Telephone Encounter >> Dec 28, 2019  2:43 PM Loma Boston wrote: CRM for notification. See Telephone encounter for: 12/28/19.physical therapist Gerald Stabs) [facility] has called and states that he wants the nurse to call him asap at [phone]. States pt has wounds oozing on front of both lower legs that the nurses and wrapping but they are oozing and smell foul. He feel certain the pt needs to be seen in ER as quite concerning but pt does not like the idea. Wants CB from nurse to help achieve as worried about pt.

## 2019-12-29 ENCOUNTER — Emergency Department
Admission: EM | Admit: 2019-12-29 | Discharge: 2019-12-29 | Disposition: A | Discharge status: Home / Self Care | Payer: Medicare HMO | Attending: Emergency Medicine | Admitting: Emergency Medicine

## 2019-12-29 ENCOUNTER — Other Ambulatory Visit: Payer: Self-pay

## 2019-12-29 ENCOUNTER — Emergency Department: Payer: Medicare HMO

## 2019-12-29 DIAGNOSIS — R609 Edema, unspecified: Secondary | ICD-10-CM | POA: Diagnosis not present

## 2019-12-29 DIAGNOSIS — Z7401 Bed confinement status: Secondary | ICD-10-CM | POA: Diagnosis not present

## 2019-12-29 DIAGNOSIS — R52 Pain, unspecified: Secondary | ICD-10-CM | POA: Diagnosis not present

## 2019-12-29 DIAGNOSIS — R58 Hemorrhage, not elsewhere classified: Secondary | ICD-10-CM | POA: Diagnosis not present

## 2019-12-29 DIAGNOSIS — I509 Heart failure, unspecified: Secondary | ICD-10-CM | POA: Diagnosis not present

## 2019-12-29 DIAGNOSIS — Z950 Presence of cardiac pacemaker: Secondary | ICD-10-CM | POA: Diagnosis not present

## 2019-12-29 DIAGNOSIS — E1122 Type 2 diabetes mellitus with diabetic chronic kidney disease: Secondary | ICD-10-CM | POA: Insufficient documentation

## 2019-12-29 DIAGNOSIS — Z7982 Long term (current) use of aspirin: Secondary | ICD-10-CM | POA: Diagnosis not present

## 2019-12-29 DIAGNOSIS — Z4800 Encounter for change or removal of nonsurgical wound dressing: Secondary | ICD-10-CM | POA: Insufficient documentation

## 2019-12-29 DIAGNOSIS — M2550 Pain in unspecified joint: Secondary | ICD-10-CM | POA: Diagnosis not present

## 2019-12-29 DIAGNOSIS — J45909 Unspecified asthma, uncomplicated: Secondary | ICD-10-CM | POA: Insufficient documentation

## 2019-12-29 DIAGNOSIS — Z794 Long term (current) use of insulin: Secondary | ICD-10-CM | POA: Diagnosis not present

## 2019-12-29 DIAGNOSIS — R1011 Right upper quadrant pain: Secondary | ICD-10-CM | POA: Diagnosis not present

## 2019-12-29 DIAGNOSIS — R600 Localized edema: Secondary | ICD-10-CM | POA: Diagnosis not present

## 2019-12-29 DIAGNOSIS — I130 Hypertensive heart and chronic kidney disease with heart failure and stage 1 through stage 4 chronic kidney disease, or unspecified chronic kidney disease: Secondary | ICD-10-CM | POA: Insufficient documentation

## 2019-12-29 DIAGNOSIS — R7989 Other specified abnormal findings of blood chemistry: Secondary | ICD-10-CM

## 2019-12-29 DIAGNOSIS — N183 Chronic kidney disease, stage 3 unspecified: Secondary | ICD-10-CM | POA: Insufficient documentation

## 2019-12-29 DIAGNOSIS — Z79899 Other long term (current) drug therapy: Secondary | ICD-10-CM | POA: Diagnosis not present

## 2019-12-29 DIAGNOSIS — Z7984 Long term (current) use of oral hypoglycemic drugs: Secondary | ICD-10-CM | POA: Insufficient documentation

## 2019-12-29 DIAGNOSIS — R188 Other ascites: Secondary | ICD-10-CM | POA: Diagnosis not present

## 2019-12-29 DIAGNOSIS — M79605 Pain in left leg: Secondary | ICD-10-CM | POA: Diagnosis not present

## 2019-12-29 LAB — CBC WITH DIFFERENTIAL/PLATELET
Abs Immature Granulocytes: 0.03 10*3/uL (ref 0.00–0.07)
Basophils Absolute: 0.1 10*3/uL (ref 0.0–0.1)
Basophils Relative: 1 %
Eosinophils Absolute: 0.2 10*3/uL (ref 0.0–0.5)
Eosinophils Relative: 4 %
HCT: 29 % — ABNORMAL LOW (ref 39.0–52.0)
Hemoglobin: 9.3 g/dL — ABNORMAL LOW (ref 13.0–17.0)
Immature Granulocytes: 1 %
Lymphocytes Relative: 18 %
Lymphs Abs: 1 10*3/uL (ref 0.7–4.0)
MCH: 32 pg (ref 26.0–34.0)
MCHC: 32.1 g/dL (ref 30.0–36.0)
MCV: 99.7 fL (ref 80.0–100.0)
Monocytes Absolute: 0.9 10*3/uL (ref 0.1–1.0)
Monocytes Relative: 17 %
Neutro Abs: 3.2 10*3/uL (ref 1.7–7.7)
Neutrophils Relative %: 59 %
Platelets: 241 10*3/uL (ref 150–400)
RBC: 2.91 MIL/uL — ABNORMAL LOW (ref 4.22–5.81)
RDW: 20.4 % — ABNORMAL HIGH (ref 11.5–15.5)
WBC: 5.3 10*3/uL (ref 4.0–10.5)
nRBC: 0 % (ref 0.0–0.2)

## 2019-12-29 LAB — COMPREHENSIVE METABOLIC PANEL
ALT: 17 U/L (ref 0–44)
AST: 54 U/L — ABNORMAL HIGH (ref 15–41)
Albumin: 2 g/dL — ABNORMAL LOW (ref 3.5–5.0)
Alkaline Phosphatase: 152 U/L — ABNORMAL HIGH (ref 38–126)
Anion gap: 11 (ref 5–15)
BUN: 20 mg/dL (ref 8–23)
CO2: 26 mmol/L (ref 22–32)
Calcium: 8.4 mg/dL — ABNORMAL LOW (ref 8.9–10.3)
Chloride: 100 mmol/L (ref 98–111)
Creatinine, Ser: 1.53 mg/dL — ABNORMAL HIGH (ref 0.61–1.24)
GFR, Estimated: 48 mL/min — ABNORMAL LOW (ref >60)
Glucose, Bld: 107 mg/dL — ABNORMAL HIGH (ref 70–99)
Potassium: 4.1 mmol/L (ref 3.5–5.1)
Sodium: 137 mmol/L (ref 135–145)
Total Bilirubin: 2.1 mg/dL — ABNORMAL HIGH (ref 0.3–1.2)
Total Protein: 6.7 g/dL (ref 6.5–8.1)

## 2019-12-29 LAB — PROTIME-INR
INR: 2 — ABNORMAL HIGH (ref 0.8–1.2)
Prothrombin Time: 22.3 seconds — ABNORMAL HIGH (ref 11.4–15.2)

## 2019-12-29 MED ORDER — TRAMADOL HCL 50 MG PO TABS
50.0000 mg | ORAL_TABLET | Freq: Once | ORAL | Status: AC
  Administered 2019-12-29: 50 mg via ORAL
  Filled 2019-12-29: qty 1

## 2019-12-29 NOTE — ED Notes (Signed)
Pt to be discharged. EMS called for transportation.

## 2019-12-29 NOTE — ED Notes (Signed)
Richard from supply to bring [employer]

## 2019-12-29 NOTE — ED Provider Notes (Signed)
Emergency department transfer of care note  Care of this patient was signed out to me at the end of the previous provider shift pending the right upper quadrant.  All pertinent patient formation was conveyed and all questions were answered.  This right upper quadrant ultrasound only showed no cysts without any evidence of acute infection or obstruction.  The patient has been reexamined and is ready to be discharged.  All diagnostic results have been reviewed and discussed with the patient/family.  Care plan has been outlined and the patient/family understands all current diagnoses, results, and treatment plans.  There are no new complaints, changes, or physical findings at this time.  All questions have been addressed and answered.  All medications, if any, that were given while in the emergency department or any that are being prescribed have been reviewed with the patient/family.  All side effects and adverse reactions have been explained.  Patient was instructed to, and agrees to follow-up with their primary care physician as well as return to the emergency department if any new or worsening symptoms develop.   Naaman Plummer, MD 12/29/19 [phone]

## 2019-12-29 NOTE — ED Notes (Signed)
Unna boot guaze, kerlex and koban placed to bilateral legs, patient tolerated well.

## 2019-12-29 NOTE — ED Provider Notes (Addendum)
[facility] Emergency Department Provider Note   ____________________________________________   First MD Initiated Contact with Patient 12/29/19 1216     (approximate)  I have reviewed the triage vital signs and the nursing notes.   HISTORY  Chief Complaint Leg Swelling and Wound Check    HPI Christian Berg is a 68 y.o. male who was at home.  He was supposed to have home health nurse, changes leg dressings today but PT came and saw the dressings which were soaked with blood and serosanguineous fluid and smelled awful and called his doctor who sent him to the emergency room.  Here patient reports his legs are not any more swollen than usual.  He does keep them up as much as possible when he is at home.  He did have a little bit of right upper quadrant pain which had been going on for some time.  He did not have any pain elsewhere.  When we took the dressings off his legs looked good.  There was no erythema.  The wounds are healing well with nice granulation tissue.  There is no necrosis or obvious infection anywhere.  We redressed the wounds with Unna boots and Ace wraps without a lot of compression.  Because of the patient's mild right upper quadrant pain I will go ahead and get an ultrasound since his LFTs are elevated and slightly worse than previously.         Past Medical History:  Diagnosis Date  . A-fib ([place])   . Carotid stenosis   . CHF (congestive heart failure) ([place])   . Degenerative disc disease, lumbar    s/p injury  . Diabetes mellitus without complication ([place])   . Disseminated superficial actinic porokeratosis   . Dysrhythmia    A-FIB, palpatations  . 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 Fatty liver   . History of degenerative disc disease   . History of kidney stones   . Hypercholesteremia   . Hypertension   . Kidney stones   . Obesity   . Pacemaker    inactive  . Peripheral vascular disease ([place])    Carotid stenosis  . Presence of  permanent cardiac pacemaker    Inactive  . TIA (transient ischemic attack) 2011   No deficits  . TIA (transient ischemic attack)   . Vitamin B12 deficiency     Patient Active Problem List   Diagnosis Date Noted  . Generalized weakness 10/28/2019  . AKI (acute kidney injury) ([place]) 10/28/2019  . Obesity   . C. difficile colitis 10/18/2019  . Acute exacerbation of CHF (congestive heart failure) ([place]) 10/08/2019  . Leg wound, left, initial encounter 10/08/2019  . Acquired thrombophilia ([place])   . Chronic low back pain   . Chronic anticoagulation 08/04/2019  . CKD stage 3 due to type 2 diabetes mellitus ([place]) 08/04/2019  . Intractable diarrhea 08/04/2019  . Iron deficiency anemia 06/02/2019  . 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 Moderate tricuspid regurgitation by prior echocardiogram 12/16/2018  . NASH (nonalcoholic steatohepatitis) 10/22/2015  . History of embolic stroke 72/62/0355  . Morbid (severe) obesity due to excess calories ([place]) 11/22/2014  . VFD (visual field defect) 11/22/2014  . History of TIA (transient ischemic attack) 10/31/2014  . Kidney stones 07/24/2014  . Other cirrhosis of liver ([place]) 07/24/2014  . DSAP (disseminated superficial actinic porokeratosis) 07/17/2014  . Asthma 05/31/2014  . (HFpEF) heart failure with preserved ejection fraction ([place]) 05/31/2014  . Diabetes mellitus ([place])  05/31/2014  . Hypercholesteremia 05/31/2014  . B12 deficiency 05/31/2014  . Carotid artery narrowing 02/15/2014  . Bilateral carotid artery stenosis 02/15/2014  . Chronic atrial fibrillation ([place]) 10/24/2013  . Benign prostatic hyperplasia with urinary obstruction 09/06/2012  . Heart disease 05/29/2009  . Benign essential HTN 09/29/2008  . Narrowing of intervertebral disc space 01/11/2007    Past Surgical History:  Procedure Laterality Date  . APPENDECTOMY  2004  . CARDIAC CATHETERIZATION    . CARDIAC PACEMAKER PLACEMENT  2005  . CATARACT  EXTRACTION W/PHACO Right 06/14/2014   Procedure: CATARACT EXTRACTION PHACO AND INTRAOCULAR LENS PLACEMENT (IOC);  Surgeon: Leandrew Koyanagi, MD;  Location: [place];  Service: Ophthalmology;  Laterality: Right;  . CATARACT EXTRACTION W/PHACO Left 10/27/2018   Procedure: CATARACT EXTRACTION PHACO AND INTRAOCULAR LENS PLACEMENT (IOC) LEFT DIABETIC  00:46.4  18.6%  8.63;  Surgeon: Leandrew Koyanagi, MD;  Location: [place];  Service: Ophthalmology;  Laterality: Left;  Diabetic - oral meds  . CYSTOSCOPY W/ RETROGRADES Bilateral 08/09/2014   Procedure: CYSTOSCOPY WITH RETROGRADE PYELOGRAM;  Surgeon: Hollice Espy, MD;  Location: [facility];  Service: Urology;  Laterality: Bilateral;  . ESOPHAGOGASTRODUODENOSCOPY (EGD) WITH PROPOFOL N/A 10/13/2019   Procedure: ESOPHAGOGASTRODUODENOSCOPY (EGD) WITH PROPOFOL;  Surgeon: Lesly Rubenstein, MD;  Location: [facility];  Service: Endoscopy;  Laterality: N/A;  . INTUBATION-ENDOTRACHEAL WITH TRACHEOSTOMY STANDBY  03/30/2017   Procedure: INTUBATION-ENDOTRACHEAL WITH TRACHEOSTOMY STANDBY;  Surgeon: Carloyn Manner, MD;  Location: [facility];  Service: ENT;;    Prior to Admission medications   Medication Sig Start Date End Date Taking? Authorizing Provider  albuterol (VENTOLIN HFA) 108 (90 Base) MCG/ACT inhaler Inhale 2 puffs into the lungs every 6 (six) hours as needed for wheezing or shortness of breath. 05/31/19   Birdie Sons, MD  aspirin 81 MG EC tablet Take 81 mg by mouth every evening. Patient not taking: Reported on 10/28/2019    [provider]  doxycycline (VIBRA-TABS) 100 MG tablet Take 1 tablet (100 mg total) by mouth 2 (two) times daily. 12/09/19   Birdie Sons, MD  ferrous sulfate 325 (65 FE) MG tablet Take 325 mg by mouth daily with breakfast.    [provider]  glucose blood (CONTOUR NEXT TEST) test strip Use to check blood sugar three times daily for insulin dependent diabetes E11.65 11/14/19    Birdie Sons, MD  hydrocerin (EUCERIN) CREA Apply 1 application topically daily. 10/24/19   Nicole Kindred A, DO  insulin glargine (LANTUS) 100 UNIT/ML Solostar Pen Inject 10 Units into the skin at bedtime. Patient not taking: Reported on 10/28/2019 10/23/19   Nicole Kindred A, DO  ketoconazole (NIZORAL) 2 % cream Apply 1 application topically daily as needed for irritation. 08/06/19   Loletha Grayer, MD  lovastatin (MEVACOR) 20 MG tablet Take 1 tablet (20 mg total) by mouth every evening. 04/08/19   Birdie Sons, MD  metFORMIN (GLUCOPHAGE) 1000 MG tablet Take 1 tablet by mouth twice daily Patient taking differently: Take 500 mg by mouth 2 (two) times daily with a meal.  08/29/19   Birdie Sons, MD  metoprolol tartrate (LOPRESSOR) 50 MG tablet Take 1 tablet (50 mg total) by mouth 2 (two) times daily. 10/23/19   Ezekiel Slocumb, DO  midodrine (PROAMATINE) 10 MG tablet Take 1 tablet (10 mg total) by mouth 3 (three) times daily with meals. Patient not taking: Reported on 12/09/2019 10/31/19   Lorella Nimrod, MD  Multiple Vitamin (MULTIVITAMIN WITH MINERALS) TABS tablet Take  1 tablet by mouth daily. 10/24/19   Ezekiel Slocumb, DO  Nystatin (GERHARDT'S BUTT CREAM) CREA Apply 1 application topically 3 (three) times daily. 10/23/19   Ezekiel Slocumb, DO  pantoprazole (PROTONIX) 40 MG tablet Take 1 tablet (40 mg total) by mouth 2 (two) times daily. 10/23/19 12/22/19  Ezekiel Slocumb, DO  simethicone (MYLICON) 80 MG chewable tablet Chew 1 tablet (80 mg total) by mouth every 6 (six) hours as needed for flatulence (or gas pains). Patient not taking: Reported on 12/09/2019 10/23/19   Nicole Kindred A, DO  torsemide (DEMADEX) 5 MG tablet Take 1 tablet (5 mg total) by mouth daily. 12/09/19   Birdie Sons, MD  traMADol-acetaminophen (ULTRACET) 37.5-325 MG tablet Take 1 tablet by mouth every 6 (six) hours as needed for severe pain. 08/06/19   Loletha Grayer, MD  traZODone (DESYREL) 50 MG tablet  Take 1 tablet (50 mg total) by mouth at bedtime as needed for sleep. 10/23/19   Ezekiel Slocumb, DO    Allergies Clindamycin/lincomycin, Sulfa antibiotics, and Sulfasalazine  Family History  Adopted: Yes  Problem Relation Age of Onset  . Heart disease Mother   . Fibromyalgia Sister   . Hypertension Daughter   . Migraines Daughter   . Kidney disease Neg Hx   . Prostate cancer Neg Hx   . Bladder Cancer Neg Hx     Social History Social History   Tobacco Use  . Smoking status: Never Smoker  . Smokeless tobacco: Never Used  Vaping Use  . Vaping Use: Never used  Substance Use Topics  . Alcohol use: No    Alcohol/week: 0.0 standard drinks  . Drug use: No    Review of Systems  Constitutional: No fever/chills Eyes: No visual changes. ENT: No sore throat. Cardiovascular: Denies chest pain. Respiratory: Denies shortness of breath. Gastrointestinal: Mild right upper quadrant abdominal pain.  No nausea, no vomiting.  No diarrhea.  No constipation. Genitourinary: Negative for dysuria. Musculoskeletal: Negative for back pain. Skin: Negative for rash. Neurological: Negative for headaches, focal weakness   ____________________________________________   PHYSICAL EXAM:  VITAL SIGNS: ED Triage Vitals  Enc Vitals Group     BP 12/29/19 1209 112/66     Pulse Rate 12/29/19 1209 92     Resp 12/29/19 1209 16     Temp 12/29/19 1209 97.7 F (36.5 C)     Temp Source 12/29/19 1209 Oral     SpO2 12/29/19 1209 98 %     Weight 12/29/19 1210 288 lb 12.8 oz (131 kg)     Height 12/29/19 1210 5' 8"  (1.727 m)     Head Circumference --      Peak Flow --      Pain Score 12/29/19 1209 0     Pain Loc --      Pain Edu? --      Excl. in [place]? --     Constitutional: Alert and oriented. Well appearing and in no acute distress. Eyes: Conjunctivae are normal.  Head: Atraumatic. Nose: No congestion/rhinnorhea. Mouth/Throat: Mucous membranes are moist.  Oropharynx non-erythematous. Neck: No  stridor. Cardiovascular: Normal rate, regular rhythm. Grossly normal heart sounds.  Good peripheral circulation. Respiratory: Normal respiratory effort.  No retractions. Lungs CTAB. Gastrointestinal: Soft and nontender except to palpation in the right upper quadrant where is mildly tender. No distention. No abdominal bruits.  Musculoskeletal: Legs are mildly edematous but patient reports that his not different than usual.  He dressings on his legs were wet  and smelled rotten but when we took the dressings off the legs were cleaned as noted in HPI.  There is no sign of infection or necrosis or anything else.  We redressed his wounds/legs with Unna boots as noted in HPI Neurologic:  Normal speech and language. No gross focal neurologic deficits are appreciated. No gait instability. Skin:  Skin is warm, dry and intact. No rash noted. Psychiatric: Mood and affect are normal. Speech and behavior are normal.  ____________________________________________   LABS (all labs ordered are listed, but only abnormal results are displayed)  Labs Reviewed  CBC WITH DIFFERENTIAL/PLATELET - Abnormal; Notable for the following components:      Result Value   RBC 2.91 (*)    Hemoglobin 9.3 (*)    HCT 29.0 (*)    RDW 20.4 (*)    All other components within normal limits  COMPREHENSIVE METABOLIC PANEL - Abnormal; Notable for the following components:   Glucose, Bld 107 (*)    Creatinine, Ser 1.53 (*)    Calcium 8.4 (*)    Albumin 2.0 (*)    AST 54 (*)    Alkaline Phosphatase 152 (*)    Total Bilirubin 2.1 (*)    GFR, Estimated 48 (*)    All other components within normal limits  PROTIME-INR - Abnormal; Notable for the following components:   Prothrombin Time 22.3 (*)    INR 2.0 (*)    All other components within normal limits   ____________________________________________  EKG   ____________________________________________  RADIOLOGY Gertha Calkin, personally viewed and evaluated these  images (plain radiographs) as part of my medical decision making, as well as reviewing the written report by the radiologist.  ED MD interpretation:   Official radiology report(s): No results found.  ____________________________________________   PROCEDURES  Procedure(s) performed (including Critical Care):  Procedures   ____________________________________________   INITIAL IMPRESSION / ASSESSMENT AND PLAN / ED COURSE  We will do the right upper quadrant ultrasound signed the patient out to oncoming provider anticipating he will go home if it is negative.             ____________________________________________   FINAL CLINICAL IMPRESSION(S) / ED DIAGNOSES  Final diagnoses:  Peripheral edema  Dressing change or removal, nonsurgical wound  Right upper quadrant pain     ED Discharge Orders    None      *Please note:  Christian Berg was evaluated in Emergency Department on 12/29/2019 for the symptoms described in the history of present illness. He was evaluated in the context of the global COVID-19 pandemic, which necessitated consideration that the patient might be at risk for infection with the SARS-CoV-2 virus that causes COVID-19. Institutional protocols and algorithms that pertain to the evaluation of patients at risk for COVID-19 are in a state of rapid change based on information released by regulatory bodies including the CDC and federal and state organizations. These policies and algorithms were followed during the patient's care in the ED.  Some ED evaluations and interventions may be delayed as a result of limited staffing during and the pandemic.*   Note:  This document was prepared using Dragon voice recognition software and may include unintentional dictation errors.    Nena Polio, MD 12/29/19 1605 I will sign out this patient to the oncoming provider.   Nena Polio, MD 12/29/19 [phone]

## 2019-12-29 NOTE — Discharge Instructions (Addendum)
Legs continue to have your dressings changed as you have been.  Remember to continue to keep your legs up whenever you can.  Please return for any increasing pain fever swelling or any other problems.

## 2019-12-29 NOTE — ED Triage Notes (Signed)
Patient reports bilateral lower extremity wounds for approx 1 month. Patient was seen by home health RN that advised patient to come to ER for increased bleeding/weeping to bilateral legs. Patient reports mild left leg pain. Patient A&OX3.

## 2019-12-30 DIAGNOSIS — I2510 Atherosclerotic heart disease of native coronary artery without angina pectoris: Secondary | ICD-10-CM | POA: Diagnosis not present

## 2019-12-30 DIAGNOSIS — I130 Hypertensive heart and chronic kidney disease with heart failure and stage 1 through stage 4 chronic kidney disease, or unspecified chronic kidney disease: Secondary | ICD-10-CM | POA: Diagnosis not present

## 2019-12-30 DIAGNOSIS — I482 Chronic atrial fibrillation, unspecified: Secondary | ICD-10-CM | POA: Diagnosis not present

## 2019-12-30 DIAGNOSIS — I5032 Chronic diastolic (congestive) heart failure: Secondary | ICD-10-CM | POA: Diagnosis not present

## 2019-12-30 DIAGNOSIS — I6782 Cerebral ischemia: Secondary | ICD-10-CM | POA: Diagnosis not present

## 2019-12-30 DIAGNOSIS — L565 Disseminated superficial actinic porokeratosis (DSAP): Secondary | ICD-10-CM | POA: Diagnosis not present

## 2019-12-30 DIAGNOSIS — N1832 Chronic kidney disease, stage 3b: Secondary | ICD-10-CM | POA: Diagnosis not present

## 2019-12-30 DIAGNOSIS — I6529 Occlusion and stenosis of unspecified carotid artery: Secondary | ICD-10-CM | POA: Diagnosis not present

## 2019-12-30 DIAGNOSIS — E1122 Type 2 diabetes mellitus with diabetic chronic kidney disease: Secondary | ICD-10-CM | POA: Diagnosis not present

## 2019-12-30 DIAGNOSIS — N179 Acute kidney failure, unspecified: Secondary | ICD-10-CM | POA: Diagnosis not present

## 2020-01-03 ENCOUNTER — Encounter: Payer: Self-pay | Admitting: Family Medicine

## 2020-01-03 DIAGNOSIS — L89312 Pressure ulcer of right buttock, stage 2: Secondary | ICD-10-CM | POA: Diagnosis not present

## 2020-01-03 DIAGNOSIS — N179 Acute kidney failure, unspecified: Secondary | ICD-10-CM | POA: Diagnosis not present

## 2020-01-03 DIAGNOSIS — N1832 Chronic kidney disease, stage 3b: Secondary | ICD-10-CM | POA: Diagnosis not present

## 2020-01-03 DIAGNOSIS — R601 Generalized edema: Secondary | ICD-10-CM

## 2020-01-03 DIAGNOSIS — I482 Chronic atrial fibrillation, unspecified: Secondary | ICD-10-CM | POA: Diagnosis not present

## 2020-01-03 DIAGNOSIS — I130 Hypertensive heart and chronic kidney disease with heart failure and stage 1 through stage 4 chronic kidney disease, or unspecified chronic kidney disease: Secondary | ICD-10-CM | POA: Diagnosis not present

## 2020-01-03 DIAGNOSIS — L89322 Pressure ulcer of left buttock, stage 2: Secondary | ICD-10-CM | POA: Diagnosis not present

## 2020-01-03 DIAGNOSIS — I2510 Atherosclerotic heart disease of native coronary artery without angina pectoris: Secondary | ICD-10-CM | POA: Diagnosis not present

## 2020-01-03 DIAGNOSIS — I5033 Acute on chronic diastolic (congestive) heart failure: Secondary | ICD-10-CM

## 2020-01-03 DIAGNOSIS — I5032 Chronic diastolic (congestive) heart failure: Secondary | ICD-10-CM | POA: Diagnosis not present

## 2020-01-03 DIAGNOSIS — L565 Disseminated superficial actinic porokeratosis (DSAP): Secondary | ICD-10-CM | POA: Diagnosis not present

## 2020-01-03 DIAGNOSIS — E1122 Type 2 diabetes mellitus with diabetic chronic kidney disease: Secondary | ICD-10-CM | POA: Diagnosis not present

## 2020-01-04 MED ORDER — TORSEMIDE 10 MG PO TABS: 10.0000 mg | ORAL_TABLET | Freq: Every day | ORAL | 1 refills | Status: AC

## 2020-01-05 ENCOUNTER — Telehealth: Payer: Self-pay | Admitting: Family Medicine

## 2020-01-05 DIAGNOSIS — N179 Acute kidney failure, unspecified: Secondary | ICD-10-CM | POA: Diagnosis not present

## 2020-01-05 DIAGNOSIS — I2510 Atherosclerotic heart disease of native coronary artery without angina pectoris: Secondary | ICD-10-CM | POA: Diagnosis not present

## 2020-01-05 DIAGNOSIS — E1122 Type 2 diabetes mellitus with diabetic chronic kidney disease: Secondary | ICD-10-CM | POA: Diagnosis not present

## 2020-01-05 DIAGNOSIS — L89322 Pressure ulcer of left buttock, stage 2: Secondary | ICD-10-CM | POA: Diagnosis not present

## 2020-01-05 DIAGNOSIS — L97112 Non-pressure chronic ulcer of right thigh with fat layer exposed: Secondary | ICD-10-CM | POA: Diagnosis not present

## 2020-01-05 DIAGNOSIS — I482 Chronic atrial fibrillation, unspecified: Secondary | ICD-10-CM | POA: Diagnosis not present

## 2020-01-05 DIAGNOSIS — I5032 Chronic diastolic (congestive) heart failure: Secondary | ICD-10-CM | POA: Diagnosis not present

## 2020-01-05 DIAGNOSIS — L89312 Pressure ulcer of right buttock, stage 2: Secondary | ICD-10-CM | POA: Diagnosis not present

## 2020-01-05 DIAGNOSIS — S91109A Unspecified open wound of unspecified toe(s) without damage to nail, initial encounter: Secondary | ICD-10-CM | POA: Diagnosis not present

## 2020-01-05 DIAGNOSIS — L97212 Non-pressure chronic ulcer of right calf with fat layer exposed: Secondary | ICD-10-CM | POA: Diagnosis not present

## 2020-01-05 DIAGNOSIS — N1832 Chronic kidney disease, stage 3b: Secondary | ICD-10-CM | POA: Diagnosis not present

## 2020-01-05 DIAGNOSIS — L565 Disseminated superficial actinic porokeratosis (DSAP): Secondary | ICD-10-CM | POA: Diagnosis not present

## 2020-01-05 DIAGNOSIS — I130 Hypertensive heart and chronic kidney disease with heart failure and stage 1 through stage 4 chronic kidney disease, or unspecified chronic kidney disease: Secondary | ICD-10-CM | POA: Diagnosis not present

## 2020-01-05 NOTE — Telephone Encounter (Signed)
[facility] immediately if worsening at anytime. Thanks

## 2020-01-05 NOTE — Telephone Encounter (Signed)
Please advise if ok to give requested verbal order below. Dr. Caryn Section is out of the office today.

## 2020-01-05 NOTE — Telephone Encounter (Signed)
Verbal order given and nurse understood. KW

## 2020-01-05 NOTE — Telephone Encounter (Signed)
Sonja king rn with [facility] is calling and the pt wounds are bleeding and she would like a order to check his protime today . Becky Sax will be at patient home around 130 pm today. A verbal ok is ok

## 2020-01-06 ENCOUNTER — Other Ambulatory Visit: Payer: Self-pay | Admitting: Family Medicine

## 2020-01-06 DIAGNOSIS — I5033 Acute on chronic diastolic (congestive) heart failure: Secondary | ICD-10-CM

## 2020-01-06 DIAGNOSIS — I482 Chronic atrial fibrillation, unspecified: Secondary | ICD-10-CM

## 2020-01-09 DIAGNOSIS — N1832 Chronic kidney disease, stage 3b: Secondary | ICD-10-CM | POA: Diagnosis not present

## 2020-01-09 DIAGNOSIS — I130 Hypertensive heart and chronic kidney disease with heart failure and stage 1 through stage 4 chronic kidney disease, or unspecified chronic kidney disease: Secondary | ICD-10-CM | POA: Diagnosis not present

## 2020-01-09 DIAGNOSIS — I482 Chronic atrial fibrillation, unspecified: Secondary | ICD-10-CM | POA: Diagnosis not present

## 2020-01-09 DIAGNOSIS — L89322 Pressure ulcer of left buttock, stage 2: Secondary | ICD-10-CM | POA: Diagnosis not present

## 2020-01-09 DIAGNOSIS — I5032 Chronic diastolic (congestive) heart failure: Secondary | ICD-10-CM | POA: Diagnosis not present

## 2020-01-09 DIAGNOSIS — I2510 Atherosclerotic heart disease of native coronary artery without angina pectoris: Secondary | ICD-10-CM | POA: Diagnosis not present

## 2020-01-09 DIAGNOSIS — L565 Disseminated superficial actinic porokeratosis (DSAP): Secondary | ICD-10-CM | POA: Diagnosis not present

## 2020-01-09 DIAGNOSIS — E1122 Type 2 diabetes mellitus with diabetic chronic kidney disease: Secondary | ICD-10-CM | POA: Diagnosis not present

## 2020-01-09 DIAGNOSIS — L89312 Pressure ulcer of right buttock, stage 2: Secondary | ICD-10-CM | POA: Diagnosis not present

## 2020-01-09 DIAGNOSIS — N179 Acute kidney failure, unspecified: Secondary | ICD-10-CM | POA: Diagnosis not present

## 2020-01-13 DIAGNOSIS — I2510 Atherosclerotic heart disease of native coronary artery without angina pectoris: Secondary | ICD-10-CM | POA: Diagnosis not present

## 2020-01-13 DIAGNOSIS — E1122 Type 2 diabetes mellitus with diabetic chronic kidney disease: Secondary | ICD-10-CM | POA: Diagnosis not present

## 2020-01-13 DIAGNOSIS — N179 Acute kidney failure, unspecified: Secondary | ICD-10-CM | POA: Diagnosis not present

## 2020-01-13 DIAGNOSIS — I482 Chronic atrial fibrillation, unspecified: Secondary | ICD-10-CM | POA: Diagnosis not present

## 2020-01-13 DIAGNOSIS — N1832 Chronic kidney disease, stage 3b: Secondary | ICD-10-CM | POA: Diagnosis not present

## 2020-01-13 DIAGNOSIS — I130 Hypertensive heart and chronic kidney disease with heart failure and stage 1 through stage 4 chronic kidney disease, or unspecified chronic kidney disease: Secondary | ICD-10-CM | POA: Diagnosis not present

## 2020-01-13 DIAGNOSIS — L565 Disseminated superficial actinic porokeratosis (DSAP): Secondary | ICD-10-CM | POA: Diagnosis not present

## 2020-01-13 DIAGNOSIS — L89322 Pressure ulcer of left buttock, stage 2: Secondary | ICD-10-CM | POA: Diagnosis not present

## 2020-01-13 DIAGNOSIS — L89312 Pressure ulcer of right buttock, stage 2: Secondary | ICD-10-CM | POA: Diagnosis not present

## 2020-01-13 DIAGNOSIS — I5032 Chronic diastolic (congestive) heart failure: Secondary | ICD-10-CM | POA: Diagnosis not present

## 2020-01-16 DIAGNOSIS — N1832 Chronic kidney disease, stage 3b: Secondary | ICD-10-CM | POA: Diagnosis not present

## 2020-01-16 DIAGNOSIS — L89322 Pressure ulcer of left buttock, stage 2: Secondary | ICD-10-CM | POA: Diagnosis not present

## 2020-01-16 DIAGNOSIS — I482 Chronic atrial fibrillation, unspecified: Secondary | ICD-10-CM | POA: Diagnosis not present

## 2020-01-16 DIAGNOSIS — I130 Hypertensive heart and chronic kidney disease with heart failure and stage 1 through stage 4 chronic kidney disease, or unspecified chronic kidney disease: Secondary | ICD-10-CM | POA: Diagnosis not present

## 2020-01-16 DIAGNOSIS — L89312 Pressure ulcer of right buttock, stage 2: Secondary | ICD-10-CM | POA: Diagnosis not present

## 2020-01-16 DIAGNOSIS — I2510 Atherosclerotic heart disease of native coronary artery without angina pectoris: Secondary | ICD-10-CM | POA: Diagnosis not present

## 2020-01-16 DIAGNOSIS — E1122 Type 2 diabetes mellitus with diabetic chronic kidney disease: Secondary | ICD-10-CM | POA: Diagnosis not present

## 2020-01-16 DIAGNOSIS — L565 Disseminated superficial actinic porokeratosis (DSAP): Secondary | ICD-10-CM | POA: Diagnosis not present

## 2020-01-16 DIAGNOSIS — I5032 Chronic diastolic (congestive) heart failure: Secondary | ICD-10-CM | POA: Diagnosis not present

## 2020-01-16 DIAGNOSIS — N179 Acute kidney failure, unspecified: Secondary | ICD-10-CM | POA: Diagnosis not present

## 2020-01-23 ENCOUNTER — Telehealth: Payer: Self-pay | Admitting: Family Medicine

## 2020-01-23 DIAGNOSIS — L89312 Pressure ulcer of right buttock, stage 2: Secondary | ICD-10-CM | POA: Diagnosis not present

## 2020-01-23 DIAGNOSIS — L565 Disseminated superficial actinic porokeratosis (DSAP): Secondary | ICD-10-CM | POA: Diagnosis not present

## 2020-01-23 DIAGNOSIS — E1122 Type 2 diabetes mellitus with diabetic chronic kidney disease: Secondary | ICD-10-CM | POA: Diagnosis not present

## 2020-01-23 DIAGNOSIS — I5032 Chronic diastolic (congestive) heart failure: Secondary | ICD-10-CM | POA: Diagnosis not present

## 2020-01-23 DIAGNOSIS — N1832 Chronic kidney disease, stage 3b: Secondary | ICD-10-CM | POA: Diagnosis not present

## 2020-01-23 DIAGNOSIS — N179 Acute kidney failure, unspecified: Secondary | ICD-10-CM | POA: Diagnosis not present

## 2020-01-23 DIAGNOSIS — I130 Hypertensive heart and chronic kidney disease with heart failure and stage 1 through stage 4 chronic kidney disease, or unspecified chronic kidney disease: Secondary | ICD-10-CM | POA: Diagnosis not present

## 2020-01-23 DIAGNOSIS — I2510 Atherosclerotic heart disease of native coronary artery without angina pectoris: Secondary | ICD-10-CM | POA: Diagnosis not present

## 2020-01-23 DIAGNOSIS — I482 Chronic atrial fibrillation, unspecified: Secondary | ICD-10-CM | POA: Diagnosis not present

## 2020-01-23 DIAGNOSIS — L89322 Pressure ulcer of left buttock, stage 2: Secondary | ICD-10-CM | POA: Diagnosis not present

## 2020-01-23 NOTE — Telephone Encounter (Signed)
Home Health Verbal Orders - Caller/Agency: [facility] Number: [phone]  States she went by today for a nurse visit and she noticed some crackles in his lungs and sob.  She wants to know if she can get an order for an xray and labs

## 2020-01-24 DIAGNOSIS — L89312 Pressure ulcer of right buttock, stage 2: Secondary | ICD-10-CM | POA: Diagnosis not present

## 2020-01-24 DIAGNOSIS — E1122 Type 2 diabetes mellitus with diabetic chronic kidney disease: Secondary | ICD-10-CM | POA: Diagnosis not present

## 2020-01-24 DIAGNOSIS — I2510 Atherosclerotic heart disease of native coronary artery without angina pectoris: Secondary | ICD-10-CM | POA: Diagnosis not present

## 2020-01-24 DIAGNOSIS — I130 Hypertensive heart and chronic kidney disease with heart failure and stage 1 through stage 4 chronic kidney disease, or unspecified chronic kidney disease: Secondary | ICD-10-CM | POA: Diagnosis not present

## 2020-01-24 DIAGNOSIS — I5032 Chronic diastolic (congestive) heart failure: Secondary | ICD-10-CM | POA: Diagnosis not present

## 2020-01-24 DIAGNOSIS — L565 Disseminated superficial actinic porokeratosis (DSAP): Secondary | ICD-10-CM | POA: Diagnosis not present

## 2020-01-24 DIAGNOSIS — N1832 Chronic kidney disease, stage 3b: Secondary | ICD-10-CM | POA: Diagnosis not present

## 2020-01-24 DIAGNOSIS — I482 Chronic atrial fibrillation, unspecified: Secondary | ICD-10-CM | POA: Diagnosis not present

## 2020-01-24 DIAGNOSIS — L89322 Pressure ulcer of left buttock, stage 2: Secondary | ICD-10-CM | POA: Diagnosis not present

## 2020-01-24 DIAGNOSIS — N179 Acute kidney failure, unspecified: Secondary | ICD-10-CM | POA: Diagnosis not present

## 2020-01-24 NOTE — Telephone Encounter (Signed)
Verbal okay was given.

## 2020-01-24 NOTE — Telephone Encounter (Signed)
OK for chest x-ray, CBC, and BNP (b natriuretic peptide) for CHF

## 2020-01-26 ENCOUNTER — Encounter: Payer: Self-pay | Admitting: Family Medicine

## 2020-01-26 ENCOUNTER — Other Ambulatory Visit: Payer: Self-pay | Admitting: Family Medicine

## 2020-01-26 DIAGNOSIS — R0989 Other specified symptoms and signs involving the circulatory and respiratory systems: Secondary | ICD-10-CM | POA: Diagnosis not present

## 2020-01-26 DIAGNOSIS — I130 Hypertensive heart and chronic kidney disease with heart failure and stage 1 through stage 4 chronic kidney disease, or unspecified chronic kidney disease: Secondary | ICD-10-CM | POA: Diagnosis not present

## 2020-01-26 DIAGNOSIS — I5033 Acute on chronic diastolic (congestive) heart failure: Secondary | ICD-10-CM

## 2020-01-26 DIAGNOSIS — I482 Chronic atrial fibrillation, unspecified: Secondary | ICD-10-CM | POA: Diagnosis not present

## 2020-01-26 DIAGNOSIS — I5032 Chronic diastolic (congestive) heart failure: Secondary | ICD-10-CM | POA: Diagnosis not present

## 2020-01-26 DIAGNOSIS — E1122 Type 2 diabetes mellitus with diabetic chronic kidney disease: Secondary | ICD-10-CM | POA: Diagnosis not present

## 2020-01-26 DIAGNOSIS — N1832 Chronic kidney disease, stage 3b: Secondary | ICD-10-CM | POA: Diagnosis not present

## 2020-01-26 DIAGNOSIS — L89322 Pressure ulcer of left buttock, stage 2: Secondary | ICD-10-CM | POA: Diagnosis not present

## 2020-01-26 DIAGNOSIS — N179 Acute kidney failure, unspecified: Secondary | ICD-10-CM | POA: Diagnosis not present

## 2020-01-26 DIAGNOSIS — L89312 Pressure ulcer of right buttock, stage 2: Secondary | ICD-10-CM | POA: Diagnosis not present

## 2020-01-26 DIAGNOSIS — I2510 Atherosclerotic heart disease of native coronary artery without angina pectoris: Secondary | ICD-10-CM | POA: Diagnosis not present

## 2020-01-26 DIAGNOSIS — L565 Disseminated superficial actinic porokeratosis (DSAP): Secondary | ICD-10-CM | POA: Diagnosis not present

## 2020-01-26 MED ORDER — METOLAZONE 2.5 MG PO TABS: 2.5000 mg | ORAL_TABLET | Freq: Every day | ORAL | 0 refills | Status: DC

## 2020-01-26 NOTE — Telephone Encounter (Signed)
X ray report received and given to [employer], PA-C to review.

## 2020-01-26 NOTE — Telephone Encounter (Signed)
Marijean Heath nurse from [facility] called on behalf of patient, stated chest xray reports were sent over to the practice around 12 noon. Wants to know if there can be an antibiotic given to patient, patient is still really weak and having crackles in the lower right lobe.  Marijean Heath c/b # [phone]

## 2020-01-26 NOTE — Progress Notes (Signed)
Sonja [facility] Nurse) was notified that the CXR did not show any acute infiltrates/pneumonia but some mild central pulmonary venous congestion. She states his wife reports weakness in legs without significant dyspnea. Will give a low dose of Zaroxolyn (2.5 mg qd) since the Demadex does not seem to be working very well recently. Advised to go to the ER if weakness or SOB worsens.

## 2020-01-27 ENCOUNTER — Encounter: Payer: Self-pay | Admitting: Family Medicine

## 2020-01-27 DIAGNOSIS — J4530 Mild persistent asthma, uncomplicated: Secondary | ICD-10-CM

## 2020-01-27 DIAGNOSIS — Z794 Long term (current) use of insulin: Secondary | ICD-10-CM

## 2020-01-27 DIAGNOSIS — E11628 Type 2 diabetes mellitus with other skin complications: Secondary | ICD-10-CM

## 2020-01-30 ENCOUNTER — Encounter: Payer: Self-pay | Admitting: Family Medicine

## 2020-01-30 DIAGNOSIS — L565 Disseminated superficial actinic porokeratosis (DSAP): Secondary | ICD-10-CM | POA: Diagnosis not present

## 2020-01-30 DIAGNOSIS — I2510 Atherosclerotic heart disease of native coronary artery without angina pectoris: Secondary | ICD-10-CM | POA: Diagnosis not present

## 2020-01-30 DIAGNOSIS — E1122 Type 2 diabetes mellitus with diabetic chronic kidney disease: Secondary | ICD-10-CM | POA: Diagnosis not present

## 2020-01-30 DIAGNOSIS — N1832 Chronic kidney disease, stage 3b: Secondary | ICD-10-CM | POA: Diagnosis not present

## 2020-01-30 DIAGNOSIS — L89312 Pressure ulcer of right buttock, stage 2: Secondary | ICD-10-CM | POA: Diagnosis not present

## 2020-01-30 DIAGNOSIS — N179 Acute kidney failure, unspecified: Secondary | ICD-10-CM | POA: Diagnosis not present

## 2020-01-30 DIAGNOSIS — I5032 Chronic diastolic (congestive) heart failure: Secondary | ICD-10-CM | POA: Diagnosis not present

## 2020-01-30 DIAGNOSIS — I482 Chronic atrial fibrillation, unspecified: Secondary | ICD-10-CM | POA: Diagnosis not present

## 2020-01-30 DIAGNOSIS — I130 Hypertensive heart and chronic kidney disease with heart failure and stage 1 through stage 4 chronic kidney disease, or unspecified chronic kidney disease: Secondary | ICD-10-CM | POA: Diagnosis not present

## 2020-01-30 DIAGNOSIS — L89322 Pressure ulcer of left buttock, stage 2: Secondary | ICD-10-CM | POA: Diagnosis not present

## 2020-01-30 LAB — CBC AND DIFFERENTIAL
HCT: 27 — AB (ref 41–53)
Hemoglobin: 9.2 — AB (ref 13.5–17.5)
Platelets: 226 (ref 150–399)
WBC: 5.9

## 2020-01-30 LAB — BASIC METABOLIC PANEL
BUN: 16 (ref 4–21)
CO2: 28 — AB (ref 13–22)
Chloride: 97 — AB (ref 99–108)
Creatinine: 1.6 — AB (ref 0.6–1.3)
Glucose: 92
Potassium: 3.7 (ref 3.4–5.3)
Sodium: 138 (ref 137–147)

## 2020-01-30 LAB — CBC: RBC: 2.8 — AB (ref 3.87–5.11)

## 2020-01-30 LAB — COMPREHENSIVE METABOLIC PANEL
Calcium: 9.4 (ref 8.7–10.7)
GFR calc non Af Amer: 42

## 2020-01-30 MED ORDER — ALBUTEROL SULFATE HFA 108 (90 BASE) MCG/ACT IN AERS: 2.0000 | INHALATION_SPRAY | Freq: Four times a day (QID) | RESPIRATORY_TRACT | 3 refills | Status: AC | PRN

## 2020-01-30 MED ORDER — DEXCOM G6 SENSOR MISC: 1.0000 [IU] | Freq: Four times a day (QID) | 4 refills | Status: DC

## 2020-01-30 MED ORDER — DEXCOM G6 TRANSMITTER MISC: 1.0000 [IU] | Freq: Four times a day (QID) | 4 refills | Status: DC

## 2020-02-01 ENCOUNTER — Ambulatory Visit: Payer: Medicare HMO | Admitting: Dermatology

## 2020-02-01 ENCOUNTER — Encounter: Payer: Self-pay | Admitting: Family Medicine

## 2020-02-02 ENCOUNTER — Telehealth: Payer: Self-pay | Admitting: Family Medicine

## 2020-02-02 DIAGNOSIS — E1122 Type 2 diabetes mellitus with diabetic chronic kidney disease: Secondary | ICD-10-CM | POA: Diagnosis not present

## 2020-02-02 DIAGNOSIS — L89322 Pressure ulcer of left buttock, stage 2: Secondary | ICD-10-CM | POA: Diagnosis not present

## 2020-02-02 DIAGNOSIS — I5032 Chronic diastolic (congestive) heart failure: Secondary | ICD-10-CM | POA: Diagnosis not present

## 2020-02-02 DIAGNOSIS — N179 Acute kidney failure, unspecified: Secondary | ICD-10-CM | POA: Diagnosis not present

## 2020-02-02 DIAGNOSIS — N1832 Chronic kidney disease, stage 3b: Secondary | ICD-10-CM | POA: Diagnosis not present

## 2020-02-02 DIAGNOSIS — I130 Hypertensive heart and chronic kidney disease with heart failure and stage 1 through stage 4 chronic kidney disease, or unspecified chronic kidney disease: Secondary | ICD-10-CM | POA: Diagnosis not present

## 2020-02-02 DIAGNOSIS — L565 Disseminated superficial actinic porokeratosis (DSAP): Secondary | ICD-10-CM | POA: Diagnosis not present

## 2020-02-02 DIAGNOSIS — L89312 Pressure ulcer of right buttock, stage 2: Secondary | ICD-10-CM | POA: Diagnosis not present

## 2020-02-02 DIAGNOSIS — I2510 Atherosclerotic heart disease of native coronary artery without angina pectoris: Secondary | ICD-10-CM | POA: Diagnosis not present

## 2020-02-02 DIAGNOSIS — I482 Chronic atrial fibrillation, unspecified: Secondary | ICD-10-CM | POA: Diagnosis not present

## 2020-02-02 NOTE — Telephone Encounter (Signed)
Sonja with [facility] would like to know if Dr Caryn Section has had a chance to look at the pt's labs?  Specifically the creatine. She is concerned about this.    In addition:  She states his wounds have healed, so would like to go to 1 X per week dressing change. Is that OK?  cb [phone]

## 2020-02-03 NOTE — Telephone Encounter (Signed)
Advised sonja as below.

## 2020-02-03 NOTE — Telephone Encounter (Signed)
Does she mean the creatinine of 1.6? That's actually better than his usually is.   Ok, to go to one time per week.

## 2020-02-06 DIAGNOSIS — I5032 Chronic diastolic (congestive) heart failure: Secondary | ICD-10-CM | POA: Diagnosis not present

## 2020-02-06 DIAGNOSIS — N1832 Chronic kidney disease, stage 3b: Secondary | ICD-10-CM | POA: Diagnosis not present

## 2020-02-06 DIAGNOSIS — N179 Acute kidney failure, unspecified: Secondary | ICD-10-CM | POA: Diagnosis not present

## 2020-02-06 DIAGNOSIS — L89322 Pressure ulcer of left buttock, stage 2: Secondary | ICD-10-CM | POA: Diagnosis not present

## 2020-02-06 DIAGNOSIS — L565 Disseminated superficial actinic porokeratosis (DSAP): Secondary | ICD-10-CM | POA: Diagnosis not present

## 2020-02-06 DIAGNOSIS — E1122 Type 2 diabetes mellitus with diabetic chronic kidney disease: Secondary | ICD-10-CM | POA: Diagnosis not present

## 2020-02-06 DIAGNOSIS — I2510 Atherosclerotic heart disease of native coronary artery without angina pectoris: Secondary | ICD-10-CM | POA: Diagnosis not present

## 2020-02-06 DIAGNOSIS — I482 Chronic atrial fibrillation, unspecified: Secondary | ICD-10-CM | POA: Diagnosis not present

## 2020-02-06 DIAGNOSIS — L89312 Pressure ulcer of right buttock, stage 2: Secondary | ICD-10-CM | POA: Diagnosis not present

## 2020-02-06 DIAGNOSIS — I130 Hypertensive heart and chronic kidney disease with heart failure and stage 1 through stage 4 chronic kidney disease, or unspecified chronic kidney disease: Secondary | ICD-10-CM | POA: Diagnosis not present

## 2020-02-08 DIAGNOSIS — L89323 Pressure ulcer of left buttock, stage 3: Secondary | ICD-10-CM | POA: Diagnosis not present

## 2020-02-09 DIAGNOSIS — L89323 Pressure ulcer of left buttock, stage 3: Secondary | ICD-10-CM | POA: Diagnosis not present

## 2020-02-13 DIAGNOSIS — I130 Hypertensive heart and chronic kidney disease with heart failure and stage 1 through stage 4 chronic kidney disease, or unspecified chronic kidney disease: Secondary | ICD-10-CM | POA: Diagnosis not present

## 2020-02-13 DIAGNOSIS — L89322 Pressure ulcer of left buttock, stage 2: Secondary | ICD-10-CM | POA: Diagnosis not present

## 2020-02-13 DIAGNOSIS — L89312 Pressure ulcer of right buttock, stage 2: Secondary | ICD-10-CM | POA: Diagnosis not present

## 2020-02-13 DIAGNOSIS — E1122 Type 2 diabetes mellitus with diabetic chronic kidney disease: Secondary | ICD-10-CM | POA: Diagnosis not present

## 2020-02-13 DIAGNOSIS — I482 Chronic atrial fibrillation, unspecified: Secondary | ICD-10-CM | POA: Diagnosis not present

## 2020-02-13 DIAGNOSIS — I2510 Atherosclerotic heart disease of native coronary artery without angina pectoris: Secondary | ICD-10-CM | POA: Diagnosis not present

## 2020-02-13 DIAGNOSIS — L565 Disseminated superficial actinic porokeratosis (DSAP): Secondary | ICD-10-CM | POA: Diagnosis not present

## 2020-02-13 DIAGNOSIS — N1832 Chronic kidney disease, stage 3b: Secondary | ICD-10-CM | POA: Diagnosis not present

## 2020-02-13 DIAGNOSIS — N179 Acute kidney failure, unspecified: Secondary | ICD-10-CM | POA: Diagnosis not present

## 2020-02-13 DIAGNOSIS — I5032 Chronic diastolic (congestive) heart failure: Secondary | ICD-10-CM | POA: Diagnosis not present

## 2020-02-14 ENCOUNTER — Other Ambulatory Visit: Payer: Self-pay

## 2020-02-14 ENCOUNTER — Emergency Department: Payer: Medicare HMO

## 2020-02-14 DIAGNOSIS — R410 Disorientation, unspecified: Secondary | ICD-10-CM | POA: Diagnosis not present

## 2020-02-14 DIAGNOSIS — Z7982 Long term (current) use of aspirin: Secondary | ICD-10-CM

## 2020-02-14 DIAGNOSIS — I878 Other specified disorders of veins: Secondary | ICD-10-CM | POA: Diagnosis not present

## 2020-02-14 DIAGNOSIS — R188 Other ascites: Secondary | ICD-10-CM | POA: Diagnosis present

## 2020-02-14 DIAGNOSIS — I868 Varicose veins of other specified sites: Secondary | ICD-10-CM | POA: Diagnosis not present

## 2020-02-14 DIAGNOSIS — K7581 Nonalcoholic steatohepatitis (NASH): Secondary | ICD-10-CM | POA: Diagnosis present

## 2020-02-14 DIAGNOSIS — K7290 Hepatic failure, unspecified without coma: Secondary | ICD-10-CM | POA: Diagnosis not present

## 2020-02-14 DIAGNOSIS — I4891 Unspecified atrial fibrillation: Secondary | ICD-10-CM | POA: Diagnosis not present

## 2020-02-14 DIAGNOSIS — K567 Ileus, unspecified: Secondary | ICD-10-CM | POA: Diagnosis not present

## 2020-02-14 DIAGNOSIS — E876 Hypokalemia: Secondary | ICD-10-CM | POA: Diagnosis present

## 2020-02-14 DIAGNOSIS — Z881 Allergy status to other antibiotic agents status: Secondary | ICD-10-CM

## 2020-02-14 DIAGNOSIS — D631 Anemia in chronic kidney disease: Secondary | ICD-10-CM | POA: Diagnosis present

## 2020-02-14 DIAGNOSIS — I4821 Permanent atrial fibrillation: Secondary | ICD-10-CM | POA: Diagnosis present

## 2020-02-14 DIAGNOSIS — N400 Enlarged prostate without lower urinary tract symptoms: Secondary | ICD-10-CM | POA: Diagnosis present

## 2020-02-14 DIAGNOSIS — R4182 Altered mental status, unspecified: Secondary | ICD-10-CM

## 2020-02-14 DIAGNOSIS — R109 Unspecified abdominal pain: Secondary | ICD-10-CM | POA: Diagnosis not present

## 2020-02-14 DIAGNOSIS — D689 Coagulation defect, unspecified: Secondary | ICD-10-CM | POA: Diagnosis not present

## 2020-02-14 DIAGNOSIS — M160 Bilateral primary osteoarthritis of hip: Secondary | ICD-10-CM | POA: Diagnosis not present

## 2020-02-14 DIAGNOSIS — N1832 Chronic kidney disease, stage 3b: Secondary | ICD-10-CM | POA: Diagnosis present

## 2020-02-14 DIAGNOSIS — D696 Thrombocytopenia, unspecified: Secondary | ICD-10-CM | POA: Diagnosis not present

## 2020-02-14 DIAGNOSIS — E785 Hyperlipidemia, unspecified: Secondary | ICD-10-CM | POA: Diagnosis present

## 2020-02-14 DIAGNOSIS — E1151 Type 2 diabetes mellitus with diabetic peripheral angiopathy without gangrene: Secondary | ICD-10-CM | POA: Diagnosis present

## 2020-02-14 DIAGNOSIS — I130 Hypertensive heart and chronic kidney disease with heart failure and stage 1 through stage 4 chronic kidney disease, or unspecified chronic kidney disease: Secondary | ICD-10-CM | POA: Diagnosis present

## 2020-02-14 DIAGNOSIS — Z66 Do not resuscitate: Secondary | ICD-10-CM | POA: Diagnosis present

## 2020-02-14 DIAGNOSIS — Z20822 Contact with and (suspected) exposure to covid-19: Secondary | ICD-10-CM | POA: Diagnosis present

## 2020-02-14 DIAGNOSIS — Z794 Long term (current) use of insulin: Secondary | ICD-10-CM

## 2020-02-14 DIAGNOSIS — Z79899 Other long term (current) drug therapy: Secondary | ICD-10-CM

## 2020-02-14 DIAGNOSIS — T502X5A Adverse effect of carbonic-anhydrase inhibitors, benzothiadiazides and other diuretics, initial encounter: Secondary | ICD-10-CM | POA: Diagnosis present

## 2020-02-14 DIAGNOSIS — Z6841 Body Mass Index (BMI) 40.0 and over, adult: Secondary | ICD-10-CM

## 2020-02-14 DIAGNOSIS — M5136 Other intervertebral disc degeneration, lumbar region: Secondary | ICD-10-CM | POA: Diagnosis present

## 2020-02-14 DIAGNOSIS — Z8673 Personal history of transient ischemic attack (TIA), and cerebral infarction without residual deficits: Secondary | ICD-10-CM

## 2020-02-14 DIAGNOSIS — K767 Hepatorenal syndrome: Secondary | ICD-10-CM | POA: Diagnosis present

## 2020-02-14 DIAGNOSIS — Z882 Allergy status to sulfonamides status: Secondary | ICD-10-CM

## 2020-02-14 DIAGNOSIS — K56600 Partial intestinal obstruction, unspecified as to cause: Secondary | ICD-10-CM | POA: Diagnosis not present

## 2020-02-14 DIAGNOSIS — I358 Other nonrheumatic aortic valve disorders: Secondary | ICD-10-CM | POA: Diagnosis present

## 2020-02-14 DIAGNOSIS — Z8249 Family history of ischemic heart disease and other diseases of the circulatory system: Secondary | ICD-10-CM

## 2020-02-14 DIAGNOSIS — I959 Hypotension, unspecified: Secondary | ICD-10-CM | POA: Diagnosis not present

## 2020-02-14 DIAGNOSIS — K7460 Unspecified cirrhosis of liver: Secondary | ICD-10-CM | POA: Diagnosis present

## 2020-02-14 DIAGNOSIS — E1122 Type 2 diabetes mellitus with diabetic chronic kidney disease: Secondary | ICD-10-CM | POA: Diagnosis present

## 2020-02-14 DIAGNOSIS — Z950 Presence of cardiac pacemaker: Secondary | ICD-10-CM

## 2020-02-14 DIAGNOSIS — I5032 Chronic diastolic (congestive) heart failure: Secondary | ICD-10-CM | POA: Diagnosis present

## 2020-02-14 DIAGNOSIS — E7800 Pure hypercholesterolemia, unspecified: Secondary | ICD-10-CM | POA: Diagnosis present

## 2020-02-14 DIAGNOSIS — R140 Abdominal distension (gaseous): Secondary | ICD-10-CM | POA: Diagnosis not present

## 2020-02-14 DIAGNOSIS — M47816 Spondylosis without myelopathy or radiculopathy, lumbar region: Secondary | ICD-10-CM | POA: Diagnosis not present

## 2020-02-14 DIAGNOSIS — Z743 Need for continuous supervision: Secondary | ICD-10-CM | POA: Diagnosis not present

## 2020-02-14 DIAGNOSIS — I509 Heart failure, unspecified: Secondary | ICD-10-CM | POA: Diagnosis not present

## 2020-02-14 DIAGNOSIS — Z515 Encounter for palliative care: Secondary | ICD-10-CM

## 2020-02-14 DIAGNOSIS — N179 Acute kidney failure, unspecified: Secondary | ICD-10-CM | POA: Diagnosis present

## 2020-02-14 DIAGNOSIS — R0902 Hypoxemia: Secondary | ICD-10-CM | POA: Diagnosis not present

## 2020-02-14 DIAGNOSIS — N200 Calculus of kidney: Secondary | ICD-10-CM | POA: Diagnosis not present

## 2020-02-14 LAB — COMPREHENSIVE METABOLIC PANEL
ALT: 18 U/L (ref 0–44)
AST: 52 U/L — ABNORMAL HIGH (ref 15–41)
Albumin: 2.1 g/dL — ABNORMAL LOW (ref 3.5–5.0)
Alkaline Phosphatase: 123 U/L (ref 38–126)
Anion gap: 16 — ABNORMAL HIGH (ref 5–15)
BUN: 26 mg/dL — ABNORMAL HIGH (ref 8–23)
CO2: 31 mmol/L (ref 22–32)
Calcium: 8.4 mg/dL — ABNORMAL LOW (ref 8.9–10.3)
Chloride: 94 mmol/L — ABNORMAL LOW (ref 98–111)
Creatinine, Ser: 2.15 mg/dL — ABNORMAL HIGH (ref 0.61–1.24)
GFR, Estimated: 32 mL/min — ABNORMAL LOW (ref >60)
Glucose, Bld: 103 mg/dL — ABNORMAL HIGH (ref 70–99)
Potassium: 2.9 mmol/L — ABNORMAL LOW (ref 3.5–5.1)
Sodium: 141 mmol/L (ref 135–145)
Total Bilirubin: 2 mg/dL — ABNORMAL HIGH (ref 0.3–1.2)
Total Protein: 7.2 g/dL (ref 6.5–8.1)

## 2020-02-14 LAB — CBC
HCT: 30.1 % — ABNORMAL LOW (ref 39.0–52.0)
Hemoglobin: 9.9 g/dL — ABNORMAL LOW (ref 13.0–17.0)
MCH: 33.9 pg (ref 26.0–34.0)
MCHC: 32.9 g/dL (ref 30.0–36.0)
MCV: 103.1 fL — ABNORMAL HIGH (ref 80.0–100.0)
Platelets: 188 10*3/uL (ref 150–400)
RBC: 2.92 MIL/uL — ABNORMAL LOW (ref 4.22–5.81)
RDW: 21.1 % — ABNORMAL HIGH (ref 11.5–15.5)
WBC: 6.5 10*3/uL (ref 4.0–10.5)
nRBC: 0 % (ref 0.0–0.2)

## 2020-02-14 LAB — TROPONIN I (HIGH SENSITIVITY): Troponin I (High Sensitivity): 24 ng/L — ABNORMAL HIGH (ref <18)

## 2020-02-14 LAB — BRAIN NATRIURETIC PEPTIDE: B Natriuretic Peptide: 781.2 pg/mL — ABNORMAL HIGH (ref 0.0–100.0)

## 2020-02-14 NOTE — ED Triage Notes (Addendum)
Pt brought in by [facility] for abd pain and distention. All VS wnl per EMS, cbg 130. Pt states no BM x 2-3 days, and decreased urination. Pt states he also feels like he has been more confused then normal. Pt has hx of fluid retention, has legs wrapped for the same.

## 2020-02-15 ENCOUNTER — Emergency Department: Payer: Medicare HMO

## 2020-02-15 ENCOUNTER — Inpatient Hospital Stay
Admission: EM | Admit: 2020-02-15 | Discharge: 2020-02-28 | DRG: 441 | Disposition: E | Payer: Medicare HMO | Attending: Internal Medicine | Admitting: Internal Medicine

## 2020-02-15 DIAGNOSIS — K7460 Unspecified cirrhosis of liver: Secondary | ICD-10-CM | POA: Diagnosis present

## 2020-02-15 DIAGNOSIS — M5136 Other intervertebral disc degeneration, lumbar region: Secondary | ICD-10-CM | POA: Diagnosis present

## 2020-02-15 DIAGNOSIS — K7581 Nonalcoholic steatohepatitis (NASH): Secondary | ICD-10-CM | POA: Diagnosis present

## 2020-02-15 DIAGNOSIS — Z8673 Personal history of transient ischemic attack (TIA), and cerebral infarction without residual deficits: Secondary | ICD-10-CM | POA: Diagnosis not present

## 2020-02-15 DIAGNOSIS — Z6841 Body Mass Index (BMI) 40.0 and over, adult: Secondary | ICD-10-CM | POA: Diagnosis not present

## 2020-02-15 DIAGNOSIS — N183 Chronic kidney disease, stage 3 unspecified: Secondary | ICD-10-CM

## 2020-02-15 DIAGNOSIS — Z950 Presence of cardiac pacemaker: Secondary | ICD-10-CM | POA: Diagnosis not present

## 2020-02-15 DIAGNOSIS — J811 Chronic pulmonary edema: Secondary | ICD-10-CM | POA: Diagnosis not present

## 2020-02-15 DIAGNOSIS — R140 Abdominal distension (gaseous): Secondary | ICD-10-CM | POA: Diagnosis not present

## 2020-02-15 DIAGNOSIS — Z515 Encounter for palliative care: Secondary | ICD-10-CM | POA: Diagnosis not present

## 2020-02-15 DIAGNOSIS — I5033 Acute on chronic diastolic (congestive) heart failure: Secondary | ICD-10-CM | POA: Diagnosis not present

## 2020-02-15 DIAGNOSIS — Z452 Encounter for adjustment and management of vascular access device: Secondary | ICD-10-CM | POA: Diagnosis not present

## 2020-02-15 DIAGNOSIS — Z20822 Contact with and (suspected) exposure to covid-19: Secondary | ICD-10-CM | POA: Diagnosis present

## 2020-02-15 DIAGNOSIS — E7800 Pure hypercholesterolemia, unspecified: Secondary | ICD-10-CM | POA: Diagnosis present

## 2020-02-15 DIAGNOSIS — N1832 Chronic kidney disease, stage 3b: Secondary | ICD-10-CM | POA: Diagnosis not present

## 2020-02-15 DIAGNOSIS — R109 Unspecified abdominal pain: Secondary | ICD-10-CM | POA: Diagnosis not present

## 2020-02-15 DIAGNOSIS — K567 Ileus, unspecified: Secondary | ICD-10-CM | POA: Diagnosis not present

## 2020-02-15 DIAGNOSIS — D631 Anemia in chronic kidney disease: Secondary | ICD-10-CM | POA: Diagnosis not present

## 2020-02-15 DIAGNOSIS — K7290 Hepatic failure, unspecified without coma: Secondary | ICD-10-CM

## 2020-02-15 DIAGNOSIS — K56609 Unspecified intestinal obstruction, unspecified as to partial versus complete obstruction: Secondary | ICD-10-CM | POA: Diagnosis not present

## 2020-02-15 DIAGNOSIS — E1122 Type 2 diabetes mellitus with diabetic chronic kidney disease: Secondary | ICD-10-CM | POA: Diagnosis not present

## 2020-02-15 DIAGNOSIS — M160 Bilateral primary osteoarthritis of hip: Secondary | ICD-10-CM | POA: Diagnosis not present

## 2020-02-15 DIAGNOSIS — Z4682 Encounter for fitting and adjustment of non-vascular catheter: Secondary | ICD-10-CM | POA: Diagnosis not present

## 2020-02-15 DIAGNOSIS — R188 Other ascites: Secondary | ICD-10-CM | POA: Diagnosis not present

## 2020-02-15 DIAGNOSIS — I130 Hypertensive heart and chronic kidney disease with heart failure and stage 1 through stage 4 chronic kidney disease, or unspecified chronic kidney disease: Secondary | ICD-10-CM | POA: Diagnosis present

## 2020-02-15 DIAGNOSIS — E1151 Type 2 diabetes mellitus with diabetic peripheral angiopathy without gangrene: Secondary | ICD-10-CM | POA: Diagnosis present

## 2020-02-15 DIAGNOSIS — K767 Hepatorenal syndrome: Secondary | ICD-10-CM | POA: Diagnosis present

## 2020-02-15 DIAGNOSIS — N200 Calculus of kidney: Secondary | ICD-10-CM | POA: Diagnosis not present

## 2020-02-15 DIAGNOSIS — K6389 Other specified diseases of intestine: Secondary | ICD-10-CM | POA: Diagnosis not present

## 2020-02-15 DIAGNOSIS — I517 Cardiomegaly: Secondary | ICD-10-CM | POA: Diagnosis not present

## 2020-02-15 DIAGNOSIS — Z66 Do not resuscitate: Secondary | ICD-10-CM | POA: Diagnosis present

## 2020-02-15 DIAGNOSIS — K625 Hemorrhage of anus and rectum: Secondary | ICD-10-CM | POA: Diagnosis not present

## 2020-02-15 DIAGNOSIS — N179 Acute kidney failure, unspecified: Secondary | ICD-10-CM | POA: Diagnosis not present

## 2020-02-15 DIAGNOSIS — I4891 Unspecified atrial fibrillation: Secondary | ICD-10-CM | POA: Diagnosis not present

## 2020-02-15 DIAGNOSIS — Z4659 Encounter for fitting and adjustment of other gastrointestinal appliance and device: Secondary | ICD-10-CM

## 2020-02-15 DIAGNOSIS — K7469 Other cirrhosis of liver: Secondary | ICD-10-CM | POA: Diagnosis not present

## 2020-02-15 DIAGNOSIS — E669 Obesity, unspecified: Secondary | ICD-10-CM | POA: Diagnosis present

## 2020-02-15 DIAGNOSIS — K56600 Partial intestinal obstruction, unspecified as to cause: Secondary | ICD-10-CM | POA: Diagnosis not present

## 2020-02-15 DIAGNOSIS — K3189 Other diseases of stomach and duodenum: Secondary | ICD-10-CM | POA: Diagnosis not present

## 2020-02-15 DIAGNOSIS — M47816 Spondylosis without myelopathy or radiculopathy, lumbar region: Secondary | ICD-10-CM | POA: Diagnosis not present

## 2020-02-15 DIAGNOSIS — I878 Other specified disorders of veins: Secondary | ICD-10-CM | POA: Diagnosis not present

## 2020-02-15 DIAGNOSIS — I5032 Chronic diastolic (congestive) heart failure: Secondary | ICD-10-CM | POA: Diagnosis present

## 2020-02-15 DIAGNOSIS — R410 Disorientation, unspecified: Secondary | ICD-10-CM | POA: Diagnosis not present

## 2020-02-15 DIAGNOSIS — E876 Hypokalemia: Secondary | ICD-10-CM | POA: Diagnosis present

## 2020-02-15 DIAGNOSIS — D689 Coagulation defect, unspecified: Secondary | ICD-10-CM | POA: Diagnosis not present

## 2020-02-15 DIAGNOSIS — E785 Hyperlipidemia, unspecified: Secondary | ICD-10-CM | POA: Diagnosis present

## 2020-02-15 DIAGNOSIS — I5030 Unspecified diastolic (congestive) heart failure: Secondary | ICD-10-CM | POA: Diagnosis present

## 2020-02-15 DIAGNOSIS — K7682 Hepatic encephalopathy: Secondary | ICD-10-CM | POA: Diagnosis present

## 2020-02-15 DIAGNOSIS — I4821 Permanent atrial fibrillation: Secondary | ICD-10-CM | POA: Diagnosis present

## 2020-02-15 DIAGNOSIS — R609 Edema, unspecified: Secondary | ICD-10-CM | POA: Diagnosis not present

## 2020-02-15 DIAGNOSIS — Z95828 Presence of other vascular implants and grafts: Secondary | ICD-10-CM

## 2020-02-15 DIAGNOSIS — R4182 Altered mental status, unspecified: Secondary | ICD-10-CM

## 2020-02-15 DIAGNOSIS — I868 Varicose veins of other specified sites: Secondary | ICD-10-CM | POA: Diagnosis not present

## 2020-02-15 LAB — PATHOLOGIST SMEAR REVIEW

## 2020-02-15 LAB — BODY FLUID CELL COUNT WITH DIFFERENTIAL
Eos, Fluid: 0 %
Lymphs, Fluid: 67 %
Monocyte-Macrophage-Serous Fluid: 33 %
Neutrophil Count, Fluid: 0 %
Other Cells, Fluid: 0 %
Total Nucleated Cell Count, Fluid: 116 cu mm

## 2020-02-15 LAB — CBG MONITORING, ED
Glucose-Capillary: 86 mg/dL (ref 70–99)
Glucose-Capillary: 85 mg/dL (ref 70–99)

## 2020-02-15 LAB — RESP PANEL BY RT-PCR (FLU A&B, COVID) ARPGX2
Influenza A by PCR: NEGATIVE
Influenza B by PCR: NEGATIVE
SARS Coronavirus 2 by RT PCR: NEGATIVE

## 2020-02-15 LAB — AMMONIA: Ammonia: 49 umol/L — ABNORMAL HIGH (ref 9–35)

## 2020-02-15 LAB — TROPONIN I (HIGH SENSITIVITY): Troponin I (High Sensitivity): 24 ng/L — ABNORMAL HIGH (ref <18)

## 2020-02-15 LAB — HEMOGLOBIN A1C
Hgb A1c MFr Bld: 4.4 % — ABNORMAL LOW (ref 4.8–5.6)
Mean Plasma Glucose: 79.58 mg/dL

## 2020-02-15 LAB — MAGNESIUM: Magnesium: 2 mg/dL (ref 1.7–2.4)

## 2020-02-15 MED ORDER — LACTULOSE ENEMA
300.0000 mL | Freq: Once | ORAL | Status: DC
  Filled 2020-02-15: qty 300

## 2020-02-15 MED ORDER — SODIUM CHLORIDE 0.9% FLUSH
3.0000 mL | Freq: Two times a day (BID) | INTRAVENOUS | Status: DC
  Administered 2020-02-15 – 2020-02-21 (×9): 3 mL via INTRAVENOUS

## 2020-02-15 MED ORDER — GERHARDT'S BUTT CREAM
1.0000 "application " | TOPICAL_CREAM | Freq: Three times a day (TID) | CUTANEOUS | Status: DC
  Filled 2020-02-15: qty 1

## 2020-02-15 MED ORDER — FUROSEMIDE 10 MG/ML IJ SOLN
40.0000 mg | Freq: Once | INTRAMUSCULAR | Status: AC
  Administered 2020-02-15: 40 mg via INTRAVENOUS
  Filled 2020-02-15: qty 4

## 2020-02-15 MED ORDER — HYDROMORPHONE HCL 1 MG/ML IJ SOLN: 1.0000 mg | Freq: Once | INTRAMUSCULAR | Status: AC

## 2020-02-15 MED ORDER — KETOCONAZOLE 2 % EX CREA
1.0000 "application " | TOPICAL_CREAM | Freq: Every day | CUTANEOUS | Status: DC | PRN
  Administered 2020-02-16: 1 via TOPICAL
  Filled 2020-02-15: qty 15

## 2020-02-15 MED ORDER — FERROUS SULFATE 325 (65 FE) MG PO TABS
325.0000 mg | ORAL_TABLET | Freq: Every day | ORAL | Status: DC
  Administered 2020-02-16 – 2020-02-20 (×4): 325 mg via ORAL
  Filled 2020-02-15 (×6): qty 1

## 2020-02-15 MED ORDER — SODIUM CHLORIDE 0.9 % IV SOLN: 250.0000 mL | INTRAVENOUS | Status: DC | PRN

## 2020-02-15 MED ORDER — MORPHINE SULFATE (PF) 4 MG/ML IV SOLN
4.0000 mg | INTRAVENOUS | Status: DC | PRN
  Administered 2020-02-16 – 2020-02-18 (×8): 4 mg via INTRAVENOUS
  Filled 2020-02-15 (×9): qty 1

## 2020-02-15 MED ORDER — ONDANSETRON HCL 4 MG PO TABS: 4.0000 mg | ORAL_TABLET | Freq: Four times a day (QID) | ORAL | Status: DC | PRN

## 2020-02-15 MED ORDER — METOPROLOL TARTRATE 50 MG PO TABS: 50.0000 mg | ORAL_TABLET | Freq: Two times a day (BID) | ORAL | Status: DC

## 2020-02-15 MED ORDER — METOLAZONE 2.5 MG PO TABS
2.5000 mg | ORAL_TABLET | Freq: Every day | ORAL | Status: DC
  Filled 2020-02-15: qty 1

## 2020-02-15 MED ORDER — PRAVASTATIN SODIUM 20 MG PO TABS
20.0000 mg | ORAL_TABLET | Freq: Every day | ORAL | Status: DC
  Administered 2020-02-16 – 2020-02-17 (×2): 20 mg via ORAL
  Filled 2020-02-15 (×3): qty 1

## 2020-02-15 MED ORDER — SODIUM CHLORIDE 0.9% FLUSH
3.0000 mL | INTRAVENOUS | Status: DC | PRN
  Administered 2020-02-19: 3 mL via INTRAVENOUS

## 2020-02-15 MED ORDER — ALBUTEROL SULFATE HFA 108 (90 BASE) MCG/ACT IN AERS
2.0000 | INHALATION_SPRAY | Freq: Four times a day (QID) | RESPIRATORY_TRACT | Status: DC | PRN
  Filled 2020-02-15: qty 6.7

## 2020-02-15 MED ORDER — METOPROLOL TARTRATE 5 MG/5ML IV SOLN
5.0000 mg | Freq: Once | INTRAVENOUS | Status: AC
  Administered 2020-02-15: 5 mg via INTRAVENOUS
  Filled 2020-02-15: qty 5

## 2020-02-15 MED ORDER — ENOXAPARIN SODIUM 80 MG/0.8ML ~~LOC~~ SOLN
0.5000 mg/kg | SUBCUTANEOUS | Status: DC
  Administered 2020-02-15 – 2020-02-18 (×3): 65 mg via SUBCUTANEOUS
  Filled 2020-02-15 (×6): qty 0.8

## 2020-02-15 MED ORDER — HYDROCERIN EX CREA
1.0000 "application " | TOPICAL_CREAM | Freq: Every day | CUTANEOUS | Status: DC
  Administered 2020-02-15 – 2020-02-21 (×5): 1 via TOPICAL
  Filled 2020-02-15 (×2): qty 113

## 2020-02-15 MED ORDER — ONDANSETRON HCL 4 MG/2ML IJ SOLN
4.0000 mg | Freq: Four times a day (QID) | INTRAMUSCULAR | Status: DC | PRN
  Administered 2020-02-17 – 2020-02-18 (×2): 4 mg via INTRAVENOUS
  Filled 2020-02-15 (×3): qty 2

## 2020-02-15 MED ORDER — METOPROLOL TARTRATE 25 MG PO TABS
25.0000 mg | ORAL_TABLET | Freq: Two times a day (BID) | ORAL | Status: DC
  Administered 2020-02-15 – 2020-02-18 (×6): 25 mg via ORAL
  Filled 2020-02-15 (×6): qty 1

## 2020-02-15 MED ORDER — INSULIN ASPART 100 UNIT/ML ~~LOC~~ SOLN
0.0000 [IU] | Freq: Three times a day (TID) | SUBCUTANEOUS | Status: DC
  Administered 2020-02-17 – 2020-02-19 (×5): 3 [IU] via SUBCUTANEOUS
  Filled 2020-02-15 (×5): qty 1

## 2020-02-15 MED ORDER — HYDROMORPHONE HCL 1 MG/ML IJ SOLN: 0.5000 mg | Freq: Once | INTRAMUSCULAR | Status: DC

## 2020-02-15 MED ORDER — LACTULOSE 10 GM/15ML PO SOLN
20.0000 g | Freq: Once | ORAL | Status: AC
  Administered 2020-02-15: 20 g via ORAL
  Filled 2020-02-15: qty 30

## 2020-02-15 MED ORDER — ADULT MULTIVITAMIN W/MINERALS CH
1.0000 | ORAL_TABLET | Freq: Every day | ORAL | Status: DC
  Administered 2020-02-15 – 2020-02-20 (×5): 1 via ORAL
  Filled 2020-02-15 (×6): qty 1

## 2020-02-15 MED ORDER — SODIUM CHLORIDE 0.9 % IV SOLN
2.0000 g | INTRAVENOUS | Status: DC
  Administered 2020-02-15 – 2020-02-20 (×6): 2 g via INTRAVENOUS
  Filled 2020-02-15 (×2): qty 2
  Filled 2020-02-15 (×2): qty 20
  Filled 2020-02-15: qty 2
  Filled 2020-02-15 (×3): qty 20

## 2020-02-15 MED ORDER — SIMETHICONE 80 MG PO CHEW
80.0000 mg | CHEWABLE_TABLET | Freq: Four times a day (QID) | ORAL | Status: DC | PRN
  Filled 2020-02-15: qty 1

## 2020-02-15 MED ORDER — TRAZODONE HCL 50 MG PO TABS: 50.0000 mg | ORAL_TABLET | Freq: Every evening | ORAL | Status: DC | PRN

## 2020-02-15 MED ORDER — PANTOPRAZOLE SODIUM 40 MG PO TBEC
40.0000 mg | DELAYED_RELEASE_TABLET | Freq: Two times a day (BID) | ORAL | Status: DC
Start: 2020-02-15 — End: 2020-02-18
  Administered 2020-02-15 – 2020-02-18 (×6): 40 mg via ORAL
  Filled 2020-02-15 (×7): qty 1

## 2020-02-15 MED ORDER — LACTULOSE 10 GM/15ML PO SOLN
20.0000 g | Freq: Three times a day (TID) | ORAL | Status: DC
  Administered 2020-02-15 – 2020-02-18 (×9): 20 g via ORAL
  Filled 2020-02-15 (×9): qty 30

## 2020-02-15 MED ORDER — POTASSIUM CHLORIDE CRYS ER 20 MEQ PO TBCR
40.0000 meq | EXTENDED_RELEASE_TABLET | Freq: Two times a day (BID) | ORAL | Status: AC
  Administered 2020-02-15: 40 meq via ORAL
  Filled 2020-02-15 (×3): qty 2

## 2020-02-15 MED ORDER — HYDROMORPHONE HCL 1 MG/ML IJ SOLN
INTRAMUSCULAR | Status: AC
  Administered 2020-02-15: 1 mg via INTRAVENOUS
  Filled 2020-02-15: qty 1

## 2020-02-15 MED ORDER — TORSEMIDE 20 MG PO TABS: 20.0000 mg | ORAL_TABLET | Freq: Every day | ORAL | Status: DC

## 2020-02-15 NOTE — ED Notes (Signed)
Pt c/o abd pain due to his ascites and abd distention. Pt does not have any medications to address his pain at this time. Pt also noted to be tachycardic with short episodes of afib RVR in the 120-130's. Dr. Francine Graven notified.

## 2020-02-15 NOTE — ED Notes (Signed)
Per lab, ammonia was hemolyzed.

## 2020-02-15 NOTE — ED Provider Notes (Signed)
Emergency department handoff note  Of this patient was signed out to me at the end of the previous provider shift pending laboratory results.  Patient did show elevated ammonia at 49 patient also has elevation in his creatinine from baseline 1.5-2.1 here over the last 2 weeks.  That is stable over 3-hour repeat.  Paracentesis was performed at bedside given patient's large volume ascites and altered mental status.  Cell count and culture are pending however ascites does look somewhat purulent.  Given patient's persistent altered mental status, elevated ammonia suggesting hepatic cephalopathy, and purulent ascites concerning for SBP, patient will be empirically covered with Rocephin and referred for admission to the internal medicine service.  I spoke to Dr. Francine Graven internal medicine who agrees with plan for admission.   .Paracentesis  Date/Time: 02/04/2020 9:10 AM Performed by: Naaman Plummer, MD Authorized by: Naaman Plummer, MD   Consent:    Consent obtained:  Verbal   Consent given by:  Patient   Risks, benefits, and alternatives were discussed: yes     Risks discussed:  Bleeding, bowel perforation, infection and pain   Alternatives discussed:  No treatment, delayed treatment and observation Universal protocol:    Patient identity confirmed:  Verbally with patient and arm band Pre-procedure details:    Procedure purpose:  Diagnostic   Preparation: Patient was prepped and draped in usual sterile fashion   Anesthesia:    Anesthesia method:  Local infiltration   Local anesthetic:  Lidocaine 1% w/o epi Procedure details:    Needle gauge:  20   Ultrasound guidance: yes     Puncture site:  R lower quadrant   Fluid removed amount:  40m   Fluid appearance:  Purulent and amber   Dressing:  4x4 sterile gauze Post-procedure details:    Procedure completion:  Tolerated well, no immediate complications      BNaaman Plummer MD 02/19/2020 [phone]

## 2020-02-15 NOTE — ED Notes (Signed)
Dr. Cheri Fowler at bedside to perform paracentesis.

## 2020-02-15 NOTE — ED Provider Notes (Signed)
[facility] Emergency Department Provider Note  ____________________________________________   I have reviewed the triage vital signs and the nursing notes.   HISTORY  Chief Complaint Abdominal Pain   History limited by and level 5 caveat due to: confusion    HPI Christian Berg is a 70 y.o. male who presents to the emergency department today because of confusion.  However due to the confusion his history is somewhat limited.  It sounds like he has felt increasingly confused over the past few days.  He also has complaints of some abdominal distention.  Furthermore he says he has not had a bowel movement in the past 2 days.  He does state he has a history of cirrhosis.  He denies any associated abdominal pain.  Records reviewed. Per medical record review patient has a history of CHF, DM, HLD, HTN, cirrhosis.   Past Medical History:  Diagnosis Date  . A-fib ([place])   . Carotid stenosis   . CHF (congestive heart failure) ([place])   . Degenerative disc disease, lumbar    s/p injury  . Diabetes mellitus without complication ([place])   . Disseminated superficial actinic porokeratosis   . Dysrhythmia    A-FIB, palpatations  . 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 Fatty liver   . History of degenerative disc disease   . History of kidney stones   . Hypercholesteremia   . Hypertension   . Kidney stones   . Obesity   . Pacemaker    inactive  . Peripheral vascular disease ([place])    Carotid stenosis  . Presence of permanent cardiac pacemaker    Inactive  . TIA (transient ischemic attack) 2011   No deficits  . TIA (transient ischemic attack)   . Vitamin B12 deficiency     Patient Active Problem List   Diagnosis Date Noted  . Generalized weakness 10/28/2019  . AKI (acute kidney injury) ([place]) 10/28/2019  . Obesity   . C. difficile colitis 10/18/2019  . Acute exacerbation of CHF (congestive heart failure) ([place]) 10/08/2019  . Leg wound, left, initial encounter 10/08/2019   . Acquired thrombophilia ([place])   . Chronic low back pain   . Chronic anticoagulation 08/04/2019  . CKD stage 3 due to type 2 diabetes mellitus ([place]) 08/04/2019  . Intractable diarrhea 08/04/2019  . Iron deficiency anemia 06/02/2019  . Mixed simple and mucopurulent chronic bronchitis ([place]) 05/31/2019  . Moderate tricuspid regurgitation by prior echocardiogram 12/16/2018  . NASH (nonalcoholic steatohepatitis) 10/22/2015  . History of embolic stroke 34/28/7681  . Morbid (severe) obesity due to excess calories ([place]) 11/22/2014  . VFD (visual field defect) 11/22/2014  . History of TIA (transient ischemic attack) 10/31/2014  . Kidney stones 07/24/2014  . Other cirrhosis of liver ([place]) 07/24/2014  . DSAP (disseminated superficial actinic porokeratosis) 07/17/2014  . Asthma 05/31/2014  . (HFpEF) heart failure with preserved ejection fraction ([place]) 05/31/2014  . Diabetes mellitus ([place]) 05/31/2014  . Hypercholesteremia 05/31/2014  . B12 deficiency 05/31/2014  . Carotid artery narrowing 02/15/2014  . Bilateral carotid artery stenosis 02/15/2014  . Chronic atrial fibrillation ([place]) 10/24/2013  . Benign prostatic hyperplasia with urinary obstruction 09/06/2012  . Heart disease 05/29/2009  . Benign essential HTN 09/29/2008  . Narrowing of intervertebral disc space 01/11/2007    Past Surgical History:  Procedure Laterality Date  . APPENDECTOMY  2004  . CARDIAC CATHETERIZATION    . CARDIAC PACEMAKER PLACEMENT  2005  . CATARACT EXTRACTION W/PHACO Right 06/14/2014   Procedure: CATARACT EXTRACTION PHACO AND  INTRAOCULAR LENS PLACEMENT (IOC);  Surgeon: Leandrew Koyanagi, MD;  Location: [facility];  Service: Ophthalmology;  Laterality: Right;  . CATARACT EXTRACTION W/PHACO Left 10/27/2018   Procedure: CATARACT EXTRACTION PHACO AND INTRAOCULAR LENS PLACEMENT (IOC) LEFT DIABETIC  00:46.4  18.6%  8.63;  Surgeon: Leandrew Koyanagi, MD;  Location: [facility];  Service:  Ophthalmology;  Laterality: Left;  Diabetic - oral meds  . CYSTOSCOPY W/ RETROGRADES Bilateral 08/09/2014   Procedure: CYSTOSCOPY WITH RETROGRADE PYELOGRAM;  Surgeon: Hollice Espy, MD;  Location: [facility];  Service: Urology;  Laterality: Bilateral;  . ESOPHAGOGASTRODUODENOSCOPY (EGD) WITH PROPOFOL N/A 10/13/2019   Procedure: ESOPHAGOGASTRODUODENOSCOPY (EGD) WITH PROPOFOL;  Surgeon: Lesly Rubenstein, MD;  Location: [facility];  Service: Endoscopy;  Laterality: N/A;  . INTUBATION-ENDOTRACHEAL WITH TRACHEOSTOMY STANDBY  03/30/2017   Procedure: INTUBATION-ENDOTRACHEAL WITH TRACHEOSTOMY STANDBY;  Surgeon: Carloyn Manner, MD;  Location: [facility];  Service: ENT;;    Prior to Admission medications   Medication Sig Start Date End Date Taking? Authorizing Provider  albuterol (VENTOLIN HFA) 108 (90 Base) MCG/ACT inhaler Inhale 2 puffs into the lungs every 6 (six) hours as needed for wheezing or shortness of breath. 01/30/20   Birdie Sons, MD  aspirin 81 MG EC tablet Take 81 mg by mouth every evening. Patient not taking: Reported on 10/28/2019    [provider]  Continuous Blood Gluc Sensor (DEXCOM G6 SENSOR) MISC Place 1 Units onto the skin 4 (four) times daily. 01/30/20   Birdie Sons, MD  Continuous Blood Gluc Transmit (DEXCOM G6 TRANSMITTER) MISC 1 Units by Does not apply route 4 (four) times daily. Use to check blood sugar four times daily. Replace after 90 days. 01/30/20   Birdie Sons, MD  doxycycline (VIBRA-TABS) 100 MG tablet Take 1 tablet (100 mg total) by mouth 2 (two) times daily. 12/09/19   Birdie Sons, MD  ferrous sulfate 325 (65 FE) MG tablet Take 325 mg by mouth daily with breakfast.    [provider]  glucose blood (CONTOUR NEXT TEST) test strip Use to check blood sugar three times daily for insulin dependent diabetes E11.65 11/14/19   Birdie Sons, MD  hydrocerin (EUCERIN) CREA Apply 1 application topically daily. 10/24/19   Nicole Kindred A, DO   insulin glargine (LANTUS) 100 UNIT/ML Solostar Pen Inject 10 Units into the skin at bedtime. Patient not taking: Reported on 10/28/2019 10/23/19   Nicole Kindred A, DO  ketoconazole (NIZORAL) 2 % cream Apply 1 application topically daily as needed for irritation. 08/06/19   Loletha Grayer, MD  lovastatin (MEVACOR) 20 MG tablet Take 1 tablet (20 mg total) by mouth every evening. 04/08/19   Birdie Sons, MD  metFORMIN (GLUCOPHAGE) 1000 MG tablet Take 1 tablet by mouth twice daily Patient taking differently: Take 500 mg by mouth 2 (two) times daily with a meal.  08/29/19   Birdie Sons, MD  metolazone (ZAROXOLYN) 2.5 MG tablet Take 1 tablet (2.5 mg total) by mouth daily. 01/26/20   Chrismon, Vickki Muff, PA-C  metoprolol tartrate (LOPRESSOR) 50 MG tablet Take 1 tablet (50 mg total) by mouth 2 (two) times daily. 10/23/19   Ezekiel Slocumb, DO  midodrine (PROAMATINE) 10 MG tablet Take 1 tablet (10 mg total) by mouth 3 (three) times daily with meals. Patient not taking: Reported on 12/09/2019 10/31/19   Lorella Nimrod, MD  Multiple Vitamin (MULTIVITAMIN WITH MINERALS) TABS tablet Take 1 tablet by mouth daily. 10/24/19   Ezekiel Slocumb, DO  Nystatin (GERHARDT'S BUTT CREAM) CREA Apply 1 application topically 3 (three) times daily. 10/23/19   Ezekiel Slocumb, DO  pantoprazole (PROTONIX) 40 MG tablet Take 1 tablet (40 mg total) by mouth 2 (two) times daily. 10/23/19 12/22/19  Ezekiel Slocumb, DO  simethicone (MYLICON) 80 MG chewable tablet Chew 1 tablet (80 mg total) by mouth every 6 (six) hours as needed for flatulence (or gas pains). Patient not taking: Reported on 12/09/2019 10/23/19   Nicole Kindred A, DO  torsemide (DEMADEX) 10 MG tablet Take 1-2 tablets (10-20 mg total) by mouth daily. 01/04/20   Birdie Sons, MD  traMADol-acetaminophen (ULTRACET) 37.5-325 MG tablet Take 1 tablet by mouth every 6 (six) hours as needed for severe pain. 08/06/19   Loletha Grayer, MD  traZODone (DESYREL) 50 MG  tablet Take 1 tablet (50 mg total) by mouth at bedtime as needed for sleep. 10/23/19   Ezekiel Slocumb, DO    Allergies Clindamycin/lincomycin, Sulfa antibiotics, and Sulfasalazine  Family History  Adopted: Yes  Problem Relation Age of Onset  . Heart disease Mother   . Fibromyalgia Sister   . Hypertension Daughter   . Migraines Daughter   . Kidney disease Neg Hx   . Prostate cancer Neg Hx   . Bladder Cancer Neg Hx     Social History Social History   Tobacco Use  . Smoking status: Never Smoker  . Smokeless tobacco: Never Used  Vaping Use  . Vaping Use: Never used  Substance Use Topics  . Alcohol use: No    Alcohol/week: 0.0 standard drinks  . Drug use: No    Review of Systems Constitutional: No fever/chills Eyes: No visual changes. ENT: No sore throat. Cardiovascular: Denies chest pain. Respiratory: Denies shortness of breath. Gastrointestinal: Positive for abdominal distention.   Genitourinary: Negative for dysuria. Musculoskeletal: Negative for back pain. Skin: Negative for rash. Neurological: Positive for confusion. ____________________________________________   PHYSICAL EXAM:  VITAL SIGNS: ED Triage Vitals  Enc Vitals Group     BP 02/14/20 2209 132/78     Pulse Rate 02/14/20 2209 98     Resp 02/14/20 2209 (!) 22     Temp 02/14/20 2209 97.8 F (36.6 C)     Temp Source 02/14/20 2209 Oral     SpO2 02/14/20 2209 98 %     Weight 02/14/20 2210 288 lb 12.8 oz (131 kg)     Height 02/14/20 2210 5' 8"  (1.727 m)     Head Circumference --      Peak Flow --      Pain Score 02/14/20 2210 5     Pain Loc --      Pain Edu? --      Excl. in [place]? --      Constitutional: Awake and alert. Somewhat confused.  Eyes: Conjunctivae are normal.  ENT      Head: Normocephalic and atraumatic.      Nose: No congestion/rhinnorhea.      Mouth/Throat: Mucous membranes are moist.      Neck: No stridor. Hematological/Lymphatic/Immunilogical: No cervical  lymphadenopathy. Cardiovascular: Normal rate, irregular rhythm.  No murmurs, rubs, or gallops.  Respiratory: Normal respiratory effort without tachypnea nor retractions. Breath sounds are clear and equal bilaterally. No wheezes/rales/rhonchi. Gastrointestinal: Distended. Non tender. Genitourinary: Deferred Musculoskeletal: Normal range of motion in all extremities. No lower extremity edema. Neurologic:  Confused. Moving all extremities. Skin:  Skin is warm, dry and intact. No rash noted.  ____________________________________________    LABS (pertinent positives/negatives)  Trop hs 24 x 2 BNP 781.2 CBC wbc 6.5, hgb 9.9, plt 188 CMP na 141, k 2.9, glu 103, cr 2.15  ____________________________________________   EKG  I, Nance Pear, attending physician, personally viewed and interpreted this EKG  EKG Time: 2205 Rate: 109 Rhythm: atrial fibrillation  Axis: right axis deviation Intervals: qtc 500 QRS: low voltage ST changes: no st elevation Impression: abnormal ekg  ____________________________________________    RADIOLOGY  CXR Changes of mild CHF  Abd x-ray Negative  CT abd/pel Cirrhosis with large ascited and pleural effusions  ____________________________________________   PROCEDURES  Procedures  ____________________________________________   INITIAL IMPRESSION / ASSESSMENT AND PLAN / ED COURSE  Pertinent labs & imaging results that were available during my care of the patient were reviewed by me and considered in my medical decision making (see chart for details).   She presented to the emergency department today with primary concern for confusion.  Also had secondary concern for abdominal distention and lack of bowel movements.  On exam his abdomen is distended.  However it is nontender.  Patient without fever or leukocytosis.  At this time I doubt SBP.  In terms of the patient's confusion I would have concern for hyperammonemia especially given lack  of bowel movements.  Awaiting ammonia at time of signout.  ____________________________________________   FINAL CLINICAL IMPRESSION(S) / ED DIAGNOSES  Final diagnoses:  Confusion  Other ascites     Note: This dictation was prepared with Dragon dictation. Any transcriptional errors that result from this process are unintentional     Nance Pear, MD 02/06/2020 [phone]

## 2020-02-15 NOTE — ED Notes (Signed)
Pt presents via triage for c/o confusion, generalized edema, SOB with exertion, and distended abdomen x 3 days. Pt noted to have 2+ pitting edema to extremities. Pt with conversational dyspnea and weakness on exam.

## 2020-02-15 NOTE — H&P (Signed)
History and Physical    Christian Berg CVE:[id number] DOB: 04-06-1950 DOA: 02/22/2020  PCP: Birdie Sons, MD   Patient coming from: Home  I have personally briefly reviewed patient's old medical records in [place]  Chief Complaint: Confusion  Most of history is obtained from ER notes and patient's wife over the phone  HPI: Christian Berg is a 70 y.o. male with medical history significant for morbid obesity (bmi 43), history of liver cirrhosis, diabetes mellitus, A. fib who was brought into the emergency room by EMS for evaluation of mental status changes.  His wife states that over the last couple of days he has had very poor oral intake and has been weak and tired.  She also notes that over the last 24 hours he has been very forgetful and increasingly confused.  Patient is currently oriented to person and place but not to time which is unusual for him.  His wife is also concerned that his urine output has decreased over the last 24 to 48 hours and he has not had a bowel movement in 4 days.  She also notes generalized body swelling despite taking his diuretics. He denies having any fever or chills, no abdominal pain, no dysuria, no frequency, no diarrhea, no headache, no dizziness, no lightheadedness, no chest pain or shortness of breath, no cough, no nausea, no vomiting, no dizziness or lightheadedness. Labs show sodium 141, potassium 2.9, chloride 94, bicarb 31, glucose 103, BUN 26, creatinine 2.15 above the baseline of 1.6, calcium 8.4, albumin 2.1, AST 52, ALT 18, total protein 7.2, ammonia 49, total bilirubin 2.0, BNP 781, troponin 24, white count 6.5, hemoglobin 9.9, hematocrit 30.1, MCV 103, RDW 21, platelet count 188 Respiratory viral panel is negative Peritoneal fluid analysis reveals 116 nucleated cells CT scan of abdomen and pelvis shows cirrhosis with large volume ascites and anasarca. Small pleural effusions, greater on the left. Chest x-ray reviewed by me shows  CHF Twelve-lead EKG reviewed by me shows A. fib with a rapid ventricular rate   ED Course: Patient is a 70 year old Caucasian male who was brought to the ER by EMS for evaluation of confusion and poor oral intake.  Patient noted to have worsening of his renal function from baseline (1.6 >> 2.15), his ammonia level is elevated and he has not had a bowel movement in about 4 days.  He will be admitted to the hospital for further evaluation.  Review of Systems: As per HPI otherwise all systems reviewed and negative.    Past Medical History:  Diagnosis Date  . A-fib ([place])   . Carotid stenosis   . CHF (congestive heart failure) ([place])   . Degenerative disc disease, lumbar    s/p injury  . Diabetes mellitus without complication ([place])   . Disseminated superficial actinic porokeratosis   . Dysrhythmia    A-FIB, palpatations  . 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 Fatty liver   . History of degenerative disc disease   . History of kidney stones   . Hypercholesteremia   . Hypertension   . Kidney stones   . Obesity   . Pacemaker    inactive  . Peripheral vascular disease ([place])    Carotid stenosis  . Presence of permanent cardiac pacemaker    Inactive  . TIA (transient ischemic attack) 2011   No deficits  . TIA (transient ischemic attack)   . Vitamin B12 deficiency     Past Surgical History:  Procedure Laterality Date  . APPENDECTOMY  2004  . CARDIAC CATHETERIZATION    . CARDIAC PACEMAKER PLACEMENT  2005  . CATARACT EXTRACTION W/PHACO Right 06/14/2014   Procedure: CATARACT EXTRACTION PHACO AND INTRAOCULAR LENS PLACEMENT (IOC);  Surgeon: Leandrew Koyanagi, MD;  Location: [facility];  Service: Ophthalmology;  Laterality: Right;  . CATARACT EXTRACTION W/PHACO Left 10/27/2018   Procedure: CATARACT EXTRACTION PHACO AND INTRAOCULAR LENS PLACEMENT (IOC) LEFT DIABETIC  00:46.4  18.6%  8.63;  Surgeon: Leandrew Koyanagi, MD;  Location: [facility];  Service: Ophthalmology;   Laterality: Left;  Diabetic - oral meds  . CYSTOSCOPY W/ RETROGRADES Bilateral 08/09/2014   Procedure: CYSTOSCOPY WITH RETROGRADE PYELOGRAM;  Surgeon: Hollice Espy, MD;  Location: [facility];  Service: Urology;  Laterality: Bilateral;  . ESOPHAGOGASTRODUODENOSCOPY (EGD) WITH PROPOFOL N/A 10/13/2019   Procedure: ESOPHAGOGASTRODUODENOSCOPY (EGD) WITH PROPOFOL;  Surgeon: Lesly Rubenstein, MD;  Location: [facility];  Service: Endoscopy;  Laterality: N/A;  . INTUBATION-ENDOTRACHEAL WITH TRACHEOSTOMY STANDBY  03/30/2017   Procedure: INTUBATION-ENDOTRACHEAL WITH TRACHEOSTOMY STANDBY;  Surgeon: Carloyn Manner, MD;  Location: [facility];  Service: ENT;;     reports that he has never smoked. He has never used smokeless tobacco. He reports that he does not drink alcohol and does not use drugs.  Allergies  Allergen Reactions  . Clindamycin/Lincomycin     Chest discomfort  . Sulfa Antibiotics Hives    Causes Raised red patches all over body  . Sulfasalazine Hives    Causes raised red patches over body    Family History  Adopted: Yes  Problem Relation Age of Onset  . Heart disease Mother   . Fibromyalgia Sister   . Hypertension Daughter   . Migraines Daughter   . Kidney disease Neg Hx   . Prostate cancer Neg Hx   . Bladder Cancer Neg Hx      Prior to Admission medications   Medication Sig Start Date End Date Taking? Authorizing Provider  albuterol (VENTOLIN HFA) 108 (90 Base) MCG/ACT inhaler Inhale 2 puffs into the lungs every 6 (six) hours as needed for wheezing or shortness of breath. 01/30/20   Birdie Sons, MD  aspirin 81 MG EC tablet Take 81 mg by mouth every evening. Patient not taking: Reported on 10/28/2019    [provider]  Continuous Blood Gluc Sensor (DEXCOM G6 SENSOR) MISC Place 1 Units onto the skin 4 (four) times daily. 01/30/20   Birdie Sons, MD  Continuous Blood Gluc Transmit (DEXCOM G6 TRANSMITTER) MISC 1 Units by Does not apply route 4 (four) times  daily. Use to check blood sugar four times daily. Replace after 90 days. 01/30/20   Birdie Sons, MD  doxycycline (VIBRA-TABS) 100 MG tablet Take 1 tablet (100 mg total) by mouth 2 (two) times daily. 12/09/19   Birdie Sons, MD  ferrous sulfate 325 (65 FE) MG tablet Take 325 mg by mouth daily with breakfast.    [provider]  glucose blood (CONTOUR NEXT TEST) test strip Use to check blood sugar three times daily for insulin dependent diabetes E11.65 11/14/19   Birdie Sons, MD  hydrocerin (EUCERIN) CREA Apply 1 application topically daily. 10/24/19   Nicole Kindred A, DO  insulin glargine (LANTUS) 100 UNIT/ML Solostar Pen Inject 10 Units into the skin at bedtime. Patient not taking: Reported on 10/28/2019 10/23/19   Nicole Kindred A, DO  ketoconazole (NIZORAL) 2 % cream Apply 1 application topically daily as needed for irritation. 08/06/19   Loletha Grayer, MD  lovastatin (MEVACOR) 20 MG  tablet Take 1 tablet (20 mg total) by mouth every evening. 04/08/19   Birdie Sons, MD  metFORMIN (GLUCOPHAGE) 1000 MG tablet Take 1 tablet by mouth twice daily Patient taking differently: Take 500 mg by mouth 2 (two) times daily with a meal.  08/29/19   Birdie Sons, MD  metolazone (ZAROXOLYN) 2.5 MG tablet Take 1 tablet (2.5 mg total) by mouth daily. 01/26/20   Chrismon, Vickki Muff, PA-C  metoprolol tartrate (LOPRESSOR) 50 MG tablet Take 1 tablet (50 mg total) by mouth 2 (two) times daily. 10/23/19   Ezekiel Slocumb, DO  midodrine (PROAMATINE) 10 MG tablet Take 1 tablet (10 mg total) by mouth 3 (three) times daily with meals. Patient not taking: Reported on 12/09/2019 10/31/19   Lorella Nimrod, MD  Multiple Vitamin (MULTIVITAMIN WITH MINERALS) TABS tablet Take 1 tablet by mouth daily. 10/24/19   Ezekiel Slocumb, DO  Nystatin (GERHARDT'S BUTT CREAM) CREA Apply 1 application topically 3 (three) times daily. 10/23/19   Ezekiel Slocumb, DO  pantoprazole (PROTONIX) 40 MG tablet Take 1 tablet  (40 mg total) by mouth 2 (two) times daily. 10/23/19 12/22/19  Ezekiel Slocumb, DO  simethicone (MYLICON) 80 MG chewable tablet Chew 1 tablet (80 mg total) by mouth every 6 (six) hours as needed for flatulence (or gas pains). Patient not taking: Reported on 12/09/2019 10/23/19   Nicole Kindred A, DO  torsemide (DEMADEX) 10 MG tablet Take 1-2 tablets (10-20 mg total) by mouth daily. 01/04/20   Birdie Sons, MD  traMADol-acetaminophen (ULTRACET) 37.5-325 MG tablet Take 1 tablet by mouth every 6 (six) hours as needed for severe pain. 08/06/19   Loletha Grayer, MD  traZODone (DESYREL) 50 MG tablet Take 1 tablet (50 mg total) by mouth at bedtime as needed for sleep. 10/23/19   Ezekiel Slocumb, DO    Physical Exam: Vitals:   02/14/2020 0900 02/05/2020 0930 01/31/2020 1000 02/06/2020 1030  BP: (!) 119/92 118/75 107/63 128/85  Pulse: (!) 102 (!) 107 (!) 124 100  Resp: 20 (!) 29  (!) 23  Temp:      TempSrc:      SpO2: 95% 100% 100% 100%  Weight:      Height:         Vitals:   02/04/2020 0900 02/16/2020 0930 02/22/2020 1000 01/29/2020 1030  BP: (!) 119/92 118/75 107/63 128/85  Pulse: (!) 102 (!) 107 (!) 124 100  Resp: 20 (!) 29  (!) 23  Temp:      TempSrc:      SpO2: 95% 100% 100% 100%  Weight:      Height:        Constitutional: NAD, alert and oriented x 2.  To person and place Eyes: PERRL, lids and conjunctivae normal ENMT: Mucous membranes are dry with crusted blood.  Neck: normal, supple, no masses, no thyromegaly Respiratory: Rales at the bases, no wheezing, no crackles. Normal respiratory effort. No accessory muscle use.  Cardiovascular: Irregularly irregular, tachycardic, no murmurs / rubs / gallops.  Lower extremities are wrapped. 2+ pedal pulses. No carotid bruits.  Abdomen: no tenderness, no masses palpated. No hepatosplenomegaly. Bowel sounds positive.  Central adiposity, abdominal wall edema Musculoskeletal: no clubbing / cyanosis. No joint deformity upper and lower extremities.   Skin: Ecchymosis and dry skin over both upper extremities Neurologic: No gross focal neurologic deficit.  Generalized weakness Psychiatric: Normal mood and affect.   Labs on Admission: I have personally reviewed following labs and imaging studies  CBC: Recent Labs  Lab 02/14/20 2214  WBC 6.5  HGB 9.9*  HCT 30.1*  MCV 103.1*  PLT 989   Basic Metabolic Panel: Recent Labs  Lab 02/14/20 2214  NA 141  K 2.9*  CL 94*  CO2 31  GLUCOSE 103*  BUN 26*  CREATININE 2.15*  CALCIUM 8.4*   GFR: Estimated Creatinine Clearance: 41.6 mL/min (A) (by C-G formula based on SCr of 2.15 mg/dL (H)). Liver Function Tests: Recent Labs  Lab 02/14/20 2214  AST 52*  ALT 18  ALKPHOS 123  BILITOT 2.0*  PROT 7.2  ALBUMIN 2.1*   No results for input(s): LIPASE, AMYLASE in the last 168 hours. Recent Labs  Lab 02/01/2020 0710  AMMONIA 49*   Coagulation Profile: No results for input(s): INR, PROTIME in the last 168 hours. Cardiac Enzymes: No results for input(s): CKTOTAL, CKMB, CKMBINDEX, TROPONINI in the last 168 hours. BNP (last 3 results) No results for input(s): PROBNP in the last 8760 hours. HbA1C: No results for input(s): HGBA1C in the last 72 hours. CBG: No results for input(s): GLUCAP in the last 168 hours. Lipid Profile: No results for input(s): CHOL, HDL, LDLCALC, TRIG, CHOLHDL, LDLDIRECT in the last 72 hours. Thyroid Function Tests: No results for input(s): TSH, T4TOTAL, FREET4, T3FREE, THYROIDAB in the last 72 hours. Anemia Panel: No results for input(s): VITAMINB12, FOLATE, FERRITIN, TIBC, IRON, RETICCTPCT in the last 72 hours. Urine analysis:    Component Value Date/Time   COLORURINE AMBER (A) 10/28/2019 0506   APPEARANCEUR HAZY (A) 10/28/2019 0506   APPEARANCEUR Cloudy (A) 08/20/2017 1309   LABSPEC 1.025 10/28/2019 0506   LABSPEC 1.041 09/14/2013 1658   PHURINE 5.0 10/28/2019 0506   GLUCOSEU NEGATIVE 10/28/2019 0506   GLUCOSEU 50 mg/dL 09/14/2013 1658   HGBUR  NEGATIVE 10/28/2019 0506   BILIRUBINUR NEGATIVE 10/28/2019 0506   BILIRUBINUR negative 04/08/2019 1205   BILIRUBINUR Negative 08/20/2017 1309   BILIRUBINUR Negative 09/14/2013 1658   KETONESUR NEGATIVE 10/28/2019 0506   PROTEINUR NEGATIVE 10/28/2019 0506   UROBILINOGEN 0.2 04/08/2019 1205   NITRITE NEGATIVE 10/28/2019 0506   LEUKOCYTESUR NEGATIVE 10/28/2019 0506   LEUKOCYTESUR Negative 09/14/2013 1658    Radiological Exams on Admission: CT ABDOMEN PELVIS WO CONTRAST  Result Date: 02/12/2020 CLINICAL DATA:  Abdominal pain and distension EXAM: CT ABDOMEN AND PELVIS WITHOUT CONTRAST TECHNIQUE: Multidetector CT imaging of the abdomen and pelvis was performed following the standard protocol without IV contrast. COMPARISON:  10/22/2019 FINDINGS: Lower chest: Borderline heart size. Dual-chamber pacer leads. Small left more than right pleural effusion mild increased from prior Hepatobiliary: History of cirrhosis with small volume liver.No evidence of biliary obstruction or stone. Pancreas: Unremarkable. Spleen: Unremarkable. Adrenals/Urinary Tract: Negative adrenals. No hydronephrosis or ureteral stone. Symmetric renal atrophic appearance. There is asymmetric left lower pole thinning with two renal calculi best seen on coronal reformats. Unremarkable bladder. Stomach/Bowel:  No obstruction. No visible bowel inflammation. Vascular/Lymphatic: No acute vascular abnormality. Atherosclerosis. Mesenteric varices associated with the enlarged right gonadal vein. No mass or adenopathy. Reproductive:No pathologic findings. Other: Large volume low-density ascites. Anasarca which is progressed from before. Musculoskeletal: Advanced and generalized spinal degeneration. IMPRESSION: 1. Cirrhosis with large volume ascites and anasarca. 2. Small pleural effusions, greater on the left. Electronically Signed   By: Monte Fantasia M.D.   On: 02/23/2020 07:01   DG Chest 1 View  Result Date: 02/14/2020 CLINICAL DATA:  Pain  and abdominal distension EXAM: CHEST  1 VIEW COMPARISON:  10/28/2019 FINDINGS: Cardiac shadow is enlarged but stable.  Pacing device is again seen and stable. The overall inspiratory effort is poor. Increased vascular congestion is noted consistent with mild CHF. Mild bibasilar atelectatic changes are noted related to the poor inspiratory effort. Postsurgical changes in the left humerus are seen. IMPRESSION: Changes of mild CHF. Electronically Signed   By: Inez Catalina M.D.   On: 02/14/2020 22:37   DG Abdomen 1 View  Result Date: 02/10/2020 CLINICAL DATA:  Abdominal distension EXAM: ABDOMEN - 1 VIEW COMPARISON:  March 30, 2017 FINDINGS: The bowel gas pattern is normal. No radio-opaque calculi or other significant radiographic abnormality are seen. There are degenerative changes of the lumbar spine and bilateral hips. IMPRESSION: Negative. Electronically Signed   By: Constance Holster M.D.   On: 01/28/2020 02:39    EKG: Independently reviewed.    Atrial fibrillation with rapid ventricular rate  Assessment/Plan Principal Problem:   Hepatic encephalopathy (HCC) Active Problems:   (HFpEF) heart failure with preserved ejection fraction (HCC)   Atrial fibrillation with RVR (HCC)   NASH (nonalcoholic steatohepatitis)   CKD stage 3 due to type 2 diabetes mellitus ([place])   Obesity   Hypokalemia    Hepatic encephalopathy Patient appears more forgetful and confused and is only oriented to person and place, not to time which is not his baseline He has not had a bowel movement despite being compliant with his lactulose Continue lactulose 3 times a day Patient is empirically on Rocephin 2 g IV daily for presumed SBP Hold diuretic therapy due to worsening renal function   History of heart failure with preserved EF Patient noted to have bilateral pleural effusions on imaging but he is not short of breath at this time Hold torsemide and metolazone due to worsening renal function (at baseline patient has  a serum creatinine of 1.6 but today on admission it is 2.15) Maintain low-sodium diet Continue metoprolol   A. fib with rapid ventricular rate Continue metoprolol for rate control Patient not on long-term anticoagulation therapy due to increased risk for bleeding    Diabetes mellitus with complications of stage III chronic kidney disease Maintain consistent carbohydrate diet Sliding scale insulin for glycemic control   Hypokalemia Secondary to diuretic use Supplement potassium Check magnesium levels   Morbid obesity (BMI 43) Complicates overall prognosis and care   DVT prophylaxis: Lovenox Code Status: DO NOT RESUSCITATE Family Communication: Greater than 50% of the plan of care with his wife over the phone.  All questions and concerns have been addressed.  She verbalizes understanding and agreed with the plan. Disposition Plan: Back to previous home environment Consults called: None    Audon Heymann MD [facility]     02/03/2020, 11:32 AM

## 2020-02-16 ENCOUNTER — Encounter: Payer: Self-pay | Admitting: Internal Medicine

## 2020-02-16 ENCOUNTER — Inpatient Hospital Stay: Payer: Medicare HMO

## 2020-02-16 DIAGNOSIS — K7290 Hepatic failure, unspecified without coma: Secondary | ICD-10-CM | POA: Diagnosis not present

## 2020-02-16 LAB — GLUCOSE, CAPILLARY
Glucose-Capillary: 88 mg/dL (ref 70–99)
Glucose-Capillary: 97 mg/dL (ref 70–99)
Glucose-Capillary: 93 mg/dL (ref 70–99)
Glucose-Capillary: 96 mg/dL (ref 70–99)
Glucose-Capillary: 100 mg/dL — ABNORMAL HIGH (ref 70–99)

## 2020-02-16 LAB — CBC
HCT: 27 % — ABNORMAL LOW (ref 39.0–52.0)
Hemoglobin: 8.9 g/dL — ABNORMAL LOW (ref 13.0–17.0)
MCH: 33.2 pg (ref 26.0–34.0)
MCHC: 33 g/dL (ref 30.0–36.0)
MCV: 100.7 fL — ABNORMAL HIGH (ref 80.0–100.0)
Platelets: 165 10*3/uL (ref 150–400)
RBC: 2.68 MIL/uL — ABNORMAL LOW (ref 4.22–5.81)
RDW: 20.8 % — ABNORMAL HIGH (ref 11.5–15.5)
WBC: 7.2 10*3/uL (ref 4.0–10.5)
nRBC: 0 % (ref 0.0–0.2)

## 2020-02-16 LAB — IRON AND TIBC
Iron: 38 ug/dL — ABNORMAL LOW (ref 45–182)
Saturation Ratios: 18 % (ref 17.9–39.5)
TIBC: 217 ug/dL — ABNORMAL LOW (ref 250–450)
UIBC: 179 ug/dL

## 2020-02-16 LAB — BODY FLUID CELL COUNT WITH DIFFERENTIAL
Eos, Fluid: 0 %
Lymphs, Fluid: 40 %
Monocyte-Macrophage-Serous Fluid: 42 %
Neutrophil Count, Fluid: 18 %
Total Nucleated Cell Count, Fluid: 73 cu mm

## 2020-02-16 LAB — BASIC METABOLIC PANEL
Anion gap: 12 (ref 5–15)
BUN: 26 mg/dL — ABNORMAL HIGH (ref 8–23)
CO2: 32 mmol/L (ref 22–32)
Calcium: 7.8 mg/dL — ABNORMAL LOW (ref 8.9–10.3)
Chloride: 98 mmol/L (ref 98–111)
Creatinine, Ser: 2.13 mg/dL — ABNORMAL HIGH (ref 0.61–1.24)
GFR, Estimated: 32 mL/min — ABNORMAL LOW (ref >60)
Glucose, Bld: 99 mg/dL (ref 70–99)
Potassium: 2.9 mmol/L — ABNORMAL LOW (ref 3.5–5.1)
Sodium: 142 mmol/L (ref 135–145)

## 2020-02-16 LAB — FOLATE: Folate: 6 ng/mL (ref >5.9)

## 2020-02-16 LAB — PHOSPHORUS: Phosphorus: 3.2 mg/dL (ref 2.5–4.6)

## 2020-02-16 LAB — PROTIME-INR
INR: 3.2 — ABNORMAL HIGH (ref 0.8–1.2)
Prothrombin Time: 31.7 seconds — ABNORMAL HIGH (ref 11.4–15.2)

## 2020-02-16 LAB — VITAMIN B12: Vitamin B-12: 1325 pg/mL — ABNORMAL HIGH (ref 180–914)

## 2020-02-16 LAB — VITAMIN D 25 HYDROXY (VIT D DEFICIENCY, FRACTURES): Vit D, 25-Hydroxy: 38.11 ng/mL (ref 30–100)

## 2020-02-16 MED ORDER — OXYCODONE HCL 5 MG PO TABS
5.0000 mg | ORAL_TABLET | Freq: Four times a day (QID) | ORAL | Status: DC | PRN
  Administered 2020-02-17 – 2020-02-18 (×3): 5 mg via ORAL
  Filled 2020-02-16 (×3): qty 1

## 2020-02-16 MED ORDER — POTASSIUM CHLORIDE CRYS ER 20 MEQ PO TBCR
40.0000 meq | EXTENDED_RELEASE_TABLET | Freq: Three times a day (TID) | ORAL | Status: AC
  Administered 2020-02-16: 40 meq via ORAL

## 2020-02-16 MED ORDER — FUROSEMIDE 10 MG/ML IJ SOLN
5.0000 mg/h | INTRAVENOUS | Status: DC
  Administered 2020-02-16 – 2020-02-19 (×3): 5 mg/h via INTRAVENOUS
  Filled 2020-02-16 (×4): qty 20

## 2020-02-16 NOTE — TOC Initial Note (Addendum)
Transition of Care [facility]) - Initial/Assessment Note    Patient Details  Name: Christian Berg MRN: [id number] Date of Birth: [birth year]  [facility] CM/SW Contact:    Magnus Ivan, LCSW Phone Number: 02/16/2020, 10:46 AM  Clinical Narrative:                Spoke to patient's spouse for Readmission Risk Screening. Patient lives with his wife who provides care at home. Active with [facility], notified Representative Jason of hospitalization. Per Corene Cornea, patient is active with [facility] for RN services. Patient's wife reported they are pleased with [facility] and hope to continue to use them at home. Patient went to [facility] in the past for rehab and his wife reported he would not be willing to go to a SNF again if possible. Patient has a w/c, RW, lift chair, and BSC at home. Wife asking if there is a BSC that has a bigger "hole" in it as it bothers patient. Per notes, DME was ordered through [facility] on last admission, CSW messaged [employer] about inquiry, per [place] the size of the hole is standard for all 3 in 1s. PCP is Dr. Caryn Section. Pharmacy is [facility].         Patient Goals and CMS Choice        Expected Discharge Plan and Services                                                Prior Living Arrangements/Services                       Activities of Daily Living      Permission Sought/Granted                  Emotional Assessment              Admission diagnosis:  Hepatic encephalopathy ([place]) [K72.90] Confusion [R41.0] Other ascites [R18.8] AMS (altered mental status) [R41.82] Patient Active Problem List   Diagnosis Date Noted  . Hepatic encephalopathy ([place]) 02/11/2020  . Hypokalemia 02/17/2020  . Generalized weakness 10/28/2019  . AKI (acute kidney injury) ([place]) 10/28/2019  . Obesity   . C. difficile colitis 10/18/2019  . Acute exacerbation of CHF (congestive heart failure) ([place])  10/08/2019  . Leg wound, left, initial encounter 10/08/2019  . Acquired thrombophilia ([place])   . Chronic low back pain   . Chronic anticoagulation 08/04/2019  . CKD stage 3 due to type 2 diabetes mellitus ([place]) 08/04/2019  . Intractable diarrhea 08/04/2019  . Iron deficiency anemia 06/02/2019  . 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 Moderate tricuspid regurgitation by prior echocardiogram 12/16/2018  . NASH (nonalcoholic steatohepatitis) 10/22/2015  . History of embolic stroke 60/63/0160  . Morbid (severe) obesity due to excess calories ([place]) 11/22/2014  . VFD (visual field defect) 11/22/2014  . History of TIA (transient ischemic attack) 10/31/2014  . Kidney stones 07/24/2014  . Other cirrhosis of liver ([place]) 07/24/2014  . DSAP (disseminated superficial actinic porokeratosis) 07/17/2014  . Asthma 05/31/2014  . (HFpEF) heart failure with preserved ejection fraction ([place]) 05/31/2014  . Diabetes mellitus ([place]) 05/31/2014  . Hypercholesteremia 05/31/2014  . B12 deficiency 05/31/2014  . Carotid artery narrowing 02/15/2014  . Bilateral carotid artery stenosis 02/15/2014  . Chronic atrial fibrillation (HCC)  10/24/2013  . Benign prostatic hyperplasia with urinary obstruction 09/06/2012  . Heart disease 05/29/2009  . Benign essential HTN 09/29/2008  . Narrowing of intervertebral disc space 01/11/2007  . Atrial fibrillation with RVR ([place]) 12/07/2006   PCP:  Birdie Sons, MD Pharmacy:   [facility] [address] [place] [address] [place] [address] Phone: [phone] Fax: [phone]     Social Determinants of Health (SDOH) Interventions    Readmission Risk Interventions Readmission Risk Prevention Plan 02/16/2020 10/12/2019  Transportation Screening Complete Complete  PCP or Specialist Appt within 3-5 Days - (No Data)  [place] or [place] - Complete  Palliative Care Screening - Not Applicable  Medication  Review (RN Care Manager) Complete Complete  PCP or Specialist appointment within 3-5 days of discharge Complete -  [place] or Home Care Consult Complete -  SW Recovery Care/Counseling Consult Complete -  Palliative Care Screening Not Applicable -  [place] Complete -  Some recent data might be hidden

## 2020-02-16 NOTE — Progress Notes (Signed)
Pt arrived to PCU room 255 at 2211.  Pt is confused and moaning.  Pt c/o severe abd pain.  HR is 145 afib.  NP on call notified.  Lasix gtt started.  Awaiting further orders.

## 2020-02-16 NOTE — Progress Notes (Signed)
[facility] Progress Note  Patient: Christian Berg    EOF:[id number]  DOA: 02/19/2020     Date of Service: the patient was seen and examined on 02/16/2020  Chief Complaint  Patient presents with  . Abdominal Pain   Brief hospital course: Christian Berg is a 70 y.o. male with medical history significant for morbid obesity (bmi 43), history of liver cirrhosis, diabetes mellitus, A. fib who was brought into the emergency room by EMS for evaluation of mental status changes.  His wife states that over the last couple of days he has had very poor oral intake and has been weak and tired.  She also notes that over the last 24 hours he has been very forgetful and increasingly confused.  Patient is currently oriented to person and place but not to time which is unusual for him.  His wife is also concerned that his urine output has decreased over the last 24 to 48 hours and he has not had a bowel movement in 4 days.  She also notes generalized body swelling despite taking his diuretics. He denies having any fever or chills, no abdominal pain, no dysuria, no frequency, no diarrhea, no headache, no dizziness, no lightheadedness, no chest pain or shortness of breath, no cough, no nausea, no vomiting, no dizziness or lightheadedness. Labs show sodium 141, potassium 2.9, chloride 94, bicarb 31, glucose 103, BUN 26, creatinine 2.15 above the baseline of 1.6, calcium 8.4, albumin 2.1, AST 52, ALT 18, total protein 7.2, ammonia 49, total bilirubin 2.0, BNP 781, troponin 24, white count 6.5, hemoglobin 9.9, hematocrit 30.1, MCV 103, RDW 21, platelet count 188 Respiratory viral panel is negative Peritoneal fluid analysis reveals 116 nucleated cells CT scan of abdomen and pelvis shows cirrhosis with large volume ascites and anasarca. Small pleural effusions, greater on the left. Chest x-ray reviewed by me shows CHF Twelve-lead EKG reviewed by me shows A. fib with a rapid ventricular rate   ED Course: Patient  is a 70 year old Caucasian male who was brought to the ER by EMS for evaluation of confusion and poor oral intake.  Patient noted to have worsening of his renal function from baseline (1.6 >> 2.15), his ammonia level is elevated and he has not had a bowel movement in about 4 days.  He will be admitted to the hospital for further evaluation.    Assessment and Plan: Principal Problem:   Hepatic encephalopathy ([place]) Active Problems:   (HFpEF) heart failure with preserved ejection fraction (HCC)   Atrial fibrillation with RVR (HCC)   NASH (nonalcoholic steatohepatitis)   CKD stage 3 due to type 2 diabetes mellitus ([place])   Obesity   Hypokalemia    Hepatic encephalopathy Patient appears more forgetful and confused and is only oriented to person and place, not to time which is not his baseline He has not had a bowel movement despite being compliant with his lactulose Continue lactulose 3 times a day Patient is empirically on Rocephin 2 g IV daily for presumed SBP   Ascites due to cirrhosis, presented with anasarca Abdominal distention due to ascites and patient was complaining of pain and discomfort, Patient agreed for paracentesis Follow ultrasound-guided paracentesis, follow cell count and fluid culture IV albumin will be given after paracentesis started  IV Lasix infusion to diurese    AKI most likely secondary to hepatorenal syndrome Continue to  monitor urine output and renal function Nephrology consulted Foley catheter will be inserted, urology consulted   History of heart  failure with preserved EF Patient noted to have bilateral pleural effusions on imaging but he is not short of breath at this time Hold torsemide and metolazone due to worsening renal function (at baseline patient has a serum creatinine of 1.6 but today on admission it is 2.15) Maintain low-sodium diet Continue metoprolol   A. fib with rapid ventricular rate Continue metoprolol for rate  control Patient not on long-term anticoagulation therapy due to increased risk for bleeding    Diabetes mellitus with complications of stage III chronic kidney disease Maintain consistent carbohydrate diet Sliding scale insulin for glycemic control   Hypokalemia Secondary to diuretic use Supplement potassium Check magnesium levels   Morbid obesity Body mass index is 43.91 kg/m.  Complicates overall prognosis and care Interventions: Calorie restricted diet advised to lose body weight and daily exercise if possible         Diet: Carb modified DVT Prophylaxis: Subcutaneous Lovenox   Advance goals of care discussion: DNR  Family Communication: family was not present at bedside, at the time of interview.  The pt provided permission to discuss medical plan with the family. Opportunity was given to ask question and all questions were answered satisfactorily.   Disposition:  Pt is from Home, admitted with anasarca, headache and colopathy, still has hepatoencephalopathy and volume overload, which precludes a safe discharge. Discharge to Home, when condition improves.  Patient may need to stay 3 to 5 days.  Subjective: No significant overnight issues, patient is still very confused, complaining of abdominal pain due to severe distention secondary to ascites Denies any chest pain or palpitations, no nausea vomiting.   Physical Exam: General:  alert oriented to time and person.  Appear in mild distress, affect appropriate Eyes: PERRLA ENT: Oral Mucosa Clear, moist  Neck: no JVD,  Cardiovascular: S1 and S2 Present, no Murmur,  Respiratory: decreased respiratory effort, Bilateral Air entry equal and Decreased, bibasilar crackles, no significant wheezing appreciated Abdomen: Bowel Sound present, distended due to ascites and edematous, generalized tenderness,  Skin: Disseminated superficial actinic keratosis Extremities: 4+ Pedal edema, no calf tenderness Neurologic:  without any new focal findings Gait not checked due to patient safety concerns  Vitals:   02/16/20 0713 02/16/20 0848 02/16/20 0952 02/16/20 0954  BP: 135/81 (!) 113/54 130/79 130/79  Pulse: (!) 115 (!) 117 (!) 110 (!) 110  Resp: 20 19 16 16   Temp:  98.1 F (36.7 C) 97.8 F (36.6 C) 97.8 F (36.6 C)  TempSrc:   Oral Oral  SpO2: 100% 100% 100% 96%  Weight:      Height:       No intake or output data in the 24 hours ending 02/16/20 1408 Filed Weights   02/14/20 2210  Weight: 131 kg    Data Reviewed: I have personally reviewed and interpreted daily labs, tele strips, imagings as discussed above. I reviewed all nursing notes, pharmacy notes, vitals, pertinent old records I have discussed plan of care as described above with RN and patient/family.  CBC: Recent Labs  Lab 02/14/20 2214 02/16/20 0404  WBC 6.5 7.2  HGB 9.9* 8.9*  HCT 30.1* 27.0*  MCV 103.1* 100.7*  PLT 188 818   Basic Metabolic Panel: Recent Labs  Lab 02/14/20 2214 02/09/2020 0048 02/16/20 0404 02/16/20 1049  NA 141  --  142  --   K 2.9*  --  2.9*  --   CL 94*  --  98  --   CO2 31  --  32  --  GLUCOSE 103*  --  99  --   BUN 26*  --  26*  --   CREATININE 2.15*  --  2.13*  --   CALCIUM 8.4*  --  7.8*  --   MG  --  2.0  --   --   PHOS  --   --   --  3.2    Studies: No results found.  Scheduled Meds: . enoxaparin (LOVENOX) injection  0.5 mg/kg Subcutaneous Q24H  . ferrous sulfate  325 mg Oral Q breakfast  . Gerhardt's butt cream  1 application Topical TID  . hydrocerin  1 application Topical Daily  . insulin aspart  0-20 Units Subcutaneous TID WC  . lactulose  20 g Oral TID  . lactulose  300 mL Rectal Once  . metoprolol tartrate  25 mg Oral BID  . multivitamin with minerals  1 tablet Oral Daily  . pantoprazole  40 mg Oral BID  . potassium chloride  40 mEq Oral TID  . pravastatin  20 mg Oral q1800  . sodium chloride flush  3 mL Intravenous Q12H   Continuous Infusions: . sodium chloride     . cefTRIAXone (ROCEPHIN)  IV 2 g (02/16/20 1119)   PRN Meds: sodium chloride, albuterol, ketoconazole, morphine injection, ondansetron **OR** ondansetron (ZOFRAN) IV, oxyCODONE, simethicone, sodium chloride flush, traZODone  Time spent: 35 minutes  Author: Val Riles. MD [facility] 02/16/2020 2:08 PM  To reach On-call, see care teams to locate the attending and reach out to them via [email or web address]. If 7PM-7AM, please contact night-coverage If you still have difficulty reaching the attending provider, please page the [facility] (Director on Call) for [facility] on amion for assistance.

## 2020-02-16 NOTE — Progress Notes (Signed)
   02/16/20 2211  Assess: MEWS Score  Temp 97.8 F (36.6 C)  BP 110/72  Pulse Rate (!) 132  Resp 18  SpO2 100 %  O2 Device Nasal Cannula  O2 Flow Rate (L/min) 2 L/min  Assess: MEWS Score  MEWS Temp 0  MEWS Systolic 0  MEWS Pulse 3  MEWS RR 0  MEWS LOC 0  MEWS Score 3  MEWS Score Color Yellow  Assess: if the MEWS score is Yellow or Red  Were vital signs taken at a resting state? Yes  Focused Assessment No change from prior assessment  Early Detection of Sepsis Score *See Row Information* Medium  MEWS guidelines implemented *See Row Information* No, previously yellow, continue vital signs every 4 hours  Treat  MEWS Interventions Escalated (See documentation below) (charge nurse, Kristine Garbe and on call NP, [place] notified.)  Pain Scale 0-10  Pain Score 10  Pain Location Abdomen  Take Vital Signs  Increase Vital Sign Frequency  Yellow: Q 2hr X 2 then Q 4hr X 2, if remains yellow, continue Q 4hrs  Escalate  MEWS: Escalate Yellow: discuss with charge nurse/RN and consider discussing with provider and RRT  Notify: Charge Nurse/RN  Name of Charge Nurse/RN Notified [place]  Date Charge Nurse/RN Notified 02/16/20  Time Charge Nurse/RN Notified 2215  Notify: Provider  Provider Name/Title Sherrilyn Rist  Date Provider Notified 02/16/20  Time Provider Notified 2215  Notification Type Page (secure chat)  Notification Reason Other (Comment) (Afib RVR)  Response Other (Comment) (awaiting orders)

## 2020-02-16 NOTE — Plan of Care (Signed)

## 2020-02-16 NOTE — Progress Notes (Signed)
   UROLOGY PROCEDURE NOTE  Indication: Need for close UOP monitoring with IV lasix, RN unable to place foley  Comorbid 70 year old male admitted with cirrhosis and altered mental status.  Significant ascites and anasarca on CT and exam, buried penis with significant scrotal swelling, unable to visualize meatus and RN unable to place Foley.  Bladder not distended on CT and no hydronephrosis.  Urologic history notable for a normal cystoscopy and negative hematuria work-up with Dr. Erlene Quan in 2016.  The patient was prepped and draped in standard sterile fashion.  On exam, the phallus was completely buried with diffuse anasarca of the groin and scrotum.  On exam I was able to palpate the glans and meatus within the opening.  Using the finger as a guide, I was ultimately able to navigate a 16 F silicone catheter into the meatus and into the bladder with return of yellow urine.  10 cc were placed in the balloon, the catheter was connected to dependent drainage, and the Foley was secured to the thigh.  Plan: Foley duration per primary team Bladder scans will be unreliable in setting of ascites  Nickolas Madrid, MD 02/16/2020

## 2020-02-16 NOTE — Progress Notes (Signed)
Agree with 2130 assessment done by 1A RN with the exception of now pt is in afib RVR.

## 2020-02-17 DIAGNOSIS — K7290 Hepatic failure, unspecified without coma: Secondary | ICD-10-CM | POA: Diagnosis not present

## 2020-02-17 LAB — BASIC METABOLIC PANEL
Anion gap: 16 — ABNORMAL HIGH (ref 5–15)
BUN: 28 mg/dL — ABNORMAL HIGH (ref 8–23)
CO2: 29 mmol/L (ref 22–32)
Calcium: 7.9 mg/dL — ABNORMAL LOW (ref 8.9–10.3)
Chloride: 95 mmol/L — ABNORMAL LOW (ref 98–111)
Creatinine, Ser: 2.31 mg/dL — ABNORMAL HIGH (ref 0.61–1.24)
GFR, Estimated: 29 mL/min — ABNORMAL LOW (ref >60)
Glucose, Bld: 107 mg/dL — ABNORMAL HIGH (ref 70–99)
Potassium: 3.2 mmol/L — ABNORMAL LOW (ref 3.5–5.1)
Sodium: 140 mmol/L (ref 135–145)

## 2020-02-17 LAB — PROTIME-INR
INR: 3.2 — ABNORMAL HIGH (ref 0.8–1.2)
Prothrombin Time: 31.7 seconds — ABNORMAL HIGH (ref 11.4–15.2)

## 2020-02-17 LAB — HEPATIC FUNCTION PANEL
ALT: 18 U/L (ref 0–44)
AST: 49 U/L — ABNORMAL HIGH (ref 15–41)
Albumin: 1.8 g/dL — ABNORMAL LOW (ref 3.5–5.0)
Alkaline Phosphatase: 114 U/L (ref 38–126)
Bilirubin, Direct: 0.7 mg/dL — ABNORMAL HIGH (ref 0.0–0.2)
Indirect Bilirubin: 1.1 mg/dL — ABNORMAL HIGH (ref 0.3–0.9)
Total Bilirubin: 1.8 mg/dL — ABNORMAL HIGH (ref 0.3–1.2)
Total Protein: 6.2 g/dL — ABNORMAL LOW (ref 6.5–8.1)

## 2020-02-17 LAB — CBC
HCT: 26.8 % — ABNORMAL LOW (ref 39.0–52.0)
Hemoglobin: 9 g/dL — ABNORMAL LOW (ref 13.0–17.0)
MCH: 34.1 pg — ABNORMAL HIGH (ref 26.0–34.0)
MCHC: 33.6 g/dL (ref 30.0–36.0)
MCV: 101.5 fL — ABNORMAL HIGH (ref 80.0–100.0)
Platelets: 137 10*3/uL — ABNORMAL LOW (ref 150–400)
RBC: 2.64 MIL/uL — ABNORMAL LOW (ref 4.22–5.81)
RDW: 21.3 % — ABNORMAL HIGH (ref 11.5–15.5)
WBC: 10.6 10*3/uL — ABNORMAL HIGH (ref 4.0–10.5)
nRBC: 0 % (ref 0.0–0.2)

## 2020-02-17 LAB — AMMONIA: Ammonia: 34 umol/L (ref 9–35)

## 2020-02-17 LAB — GLUCOSE, CAPILLARY
Glucose-Capillary: 104 mg/dL — ABNORMAL HIGH (ref 70–99)
Glucose-Capillary: 113 mg/dL — ABNORMAL HIGH (ref 70–99)
Glucose-Capillary: 139 mg/dL — ABNORMAL HIGH (ref 70–99)
Glucose-Capillary: 104 mg/dL — ABNORMAL HIGH (ref 70–99)

## 2020-02-17 LAB — MAGNESIUM: Magnesium: 1.8 mg/dL (ref 1.7–2.4)

## 2020-02-17 LAB — PHOSPHORUS: Phosphorus: 3.1 mg/dL (ref 2.5–4.6)

## 2020-02-17 MED ORDER — NEPRO/CARBSTEADY PO LIQD: 237.0000 mL | Freq: Three times a day (TID) | ORAL | Status: DC

## 2020-02-17 MED ORDER — ALBUMIN HUMAN 25 % IV SOLN
25.0000 g | Freq: Four times a day (QID) | INTRAVENOUS | Status: AC
  Administered 2020-02-17 – 2020-02-18 (×2): 25 g via INTRAVENOUS
  Filled 2020-02-17 (×2): qty 100

## 2020-02-17 MED ORDER — POTASSIUM CHLORIDE CRYS ER 20 MEQ PO TBCR
40.0000 meq | EXTENDED_RELEASE_TABLET | Freq: Once | ORAL | Status: AC
  Administered 2020-02-17: 40 meq via ORAL
  Filled 2020-02-17: qty 2

## 2020-02-17 MED ORDER — MIDODRINE HCL 5 MG PO TABS
5.0000 mg | ORAL_TABLET | Freq: Three times a day (TID) | ORAL | Status: DC
  Administered 2020-02-17 – 2020-02-18 (×2): 5 mg via ORAL
  Filled 2020-02-17 (×2): qty 1

## 2020-02-17 MED ORDER — ALBUMIN HUMAN 25 % IV SOLN
12.5000 g | Freq: Four times a day (QID) | INTRAVENOUS | Status: DC
  Administered 2020-02-17 (×2): 12.5 g via INTRAVENOUS
  Filled 2020-02-17 (×2): qty 50

## 2020-02-17 MED ORDER — DILTIAZEM HCL 30 MG PO TABS
30.0000 mg | ORAL_TABLET | Freq: Three times a day (TID) | ORAL | Status: DC
  Administered 2020-02-17 – 2020-02-20 (×5): 30 mg via ORAL
  Filled 2020-02-17 (×5): qty 1

## 2020-02-17 MED ORDER — GERHARDT'S BUTT CREAM
TOPICAL_CREAM | Freq: Two times a day (BID) | CUTANEOUS | Status: DC
  Filled 2020-02-17 (×2): qty 1

## 2020-02-17 MED ORDER — CHLORHEXIDINE GLUCONATE CLOTH 2 % EX PADS
6.0000 | MEDICATED_PAD | Freq: Every day | CUTANEOUS | Status: DC
  Administered 2020-02-19 – 2020-02-20 (×2): 6 via TOPICAL

## 2020-02-17 NOTE — Consult Note (Signed)
WOC Nurse Consult Note: Reason for Consult:Anasarca with dry scabbed lesions to legs and Moisture associated skin damage (MASD) to sacrum and bilateral gluteal folds.  Patient has Unna boots that will be due to be changed on Monday.  Dry and intact at this time.  Wound type:Inflammatory Pressure Injury POA: NA Measurement: Sacrum:  2 cm x 4 cm x 0.1 cm  Wound WLS:LHTD and moist Drainage (amount, consistency, odor) minimal serosanguinous  No odor.  Periwound: edematous and moist Dressing procedure/placement/frequency: CLeanse legs with NS and pat dry.  Moisturize legs with barrier cream.  Gerhardts butt paste to sacrum twice daily and pRN soilage.  Unna boots on Monday.  Will follow.  Domenic Moras MSN, RN, FNP-BC CWON Wound, Ostomy, Continence Nurse Pager [phone]

## 2020-02-17 NOTE — Care Management Important Message (Signed)
Important Message  Patient Details  Name: Christian Berg MRN: [id number] Date of Birth: 1950/06/18   Medicare Important Message Given:  Yes     [name] 02/17/2020, 1:50 PM

## 2020-02-17 NOTE — Progress Notes (Signed)
[facility]  ROUNDING NOTE   Subjective:  Patient well-known to [place] from prior admissions. He has underlying history of diabetes mellitus type 2, hypertension, BPH, atrial fibrillation, morbid obesity, cirrhosis, and chronic kidney disease. He appears to have had another episode of acute kidney injury. Has ongoing lower extremity edema treated with diuretics. He has had poor oral intake at home recently.  Objective:  Vital signs in last 24 hours:  Temp:  [97.8 F (36.6 C)-99 F (37.2 C)] 98 F (36.7 C) (01/21 1156) Pulse Rate:  [84-135] 101 (01/21 1443) Resp:  [18-20] 18 (01/21 1156) BP: (100-129)/(46-78) 114/61 (01/21 1443) SpO2:  [98 %-100 %] 100 % (01/21 1156) Weight:  [122.2 kg] 122.2 kg (01/21 0412)  Weight change:  Filed Weights   02/14/20 2210 02/17/20 0412  Weight: 131 kg 122.2 kg    Intake/Output: I/O last 3 completed shifts: In: 311.1 [P.O.:100; I.V.:11.1; IV Piggyback:200] Out: 300 [Urine:300]   Intake/Output this shift:  Total I/O In: 480 [P.O.:480] Out: 300 [Urine:300]  Physical Exam: General:  No acute distress  Head:  Normocephalic, atraumatic. Moist oral mucosal membranes  Eyes:  Anicteric  Neck:  Supple  Lungs:   Clear to auscultation, normal effort  Heart:  S1S2 no rubs  Abdomen:   Soft, nontender, distention noted  Extremities:  2+ peripheral edema.  Neurologic:  Awake, alert, following commands  Skin:  Warm/dry  Access:  No hemodialysis access    Basic Metabolic Panel: Recent Labs  Lab 02/14/20 2214 02/04/2020 0048 02/16/20 0404 02/16/20 1049 02/17/20 0326  NA 141  --  142  --  140  K 2.9*  --  2.9*  --  3.2*  CL 94*  --  98  --  95*  CO2 31  --  32  --  29  GLUCOSE 103*  --  99  --  107*  BUN 26*  --  26*  --  28*  CREATININE 2.15*  --  2.13*  --  2.31*  CALCIUM 8.4*  --  7.8*  --  7.9*  MG  --  2.0  --   --  1.8  PHOS  --   --   --  3.2 3.1    Liver Function Tests: Recent Labs  Lab 02/14/20 2214 02/17/20 0326   AST 52* 49*  ALT 18 18  ALKPHOS 123 114  BILITOT 2.0* 1.8*  PROT 7.2 6.2*  ALBUMIN 2.1* 1.8*   No results for input(s): LIPASE, AMYLASE in the last 168 hours. Recent Labs  Lab 02/22/2020 0710 02/17/20 0326  AMMONIA 49* 34    CBC: Recent Labs  Lab 02/14/20 2214 02/16/20 0404 02/17/20 0326  WBC 6.5 7.2 10.6*  HGB 9.9* 8.9* 9.0*  HCT 30.1* 27.0* 26.8*  MCV 103.1* 100.7* 101.5*  PLT 188 165 137*    Cardiac Enzymes: No results for input(s): CKTOTAL, CKMB, CKMBINDEX, TROPONINI in the last 168 hours.  BNP: Invalid input(s): POCBNP  CBG: Recent Labs  Lab 02/16/20 1758 02/16/20 2046 02/16/20 2217 02/17/20 0813 02/17/20 1156  GLUCAP 93 96 100* 104* 113*    Microbiology: Results for orders placed or performed during the hospital encounter of 02/16/2020  Resp Panel by RT-PCR (Flu A&B, Covid) Nasopharyngeal Swab     Status: None   Collection Time: 02/27/2020  7:56 AM   Specimen: Nasopharyngeal Swab; Nasopharyngeal(NP) swabs in vial transport medium  Result Value Ref Range Status   SARS Coronavirus 2 by RT PCR NEGATIVE NEGATIVE Final  Comment: (NOTE) SARS-CoV-2 target nucleic acids are NOT DETECTED.  The SARS-CoV-2 RNA is generally detectable in upper respiratory specimens during the acute phase of infection. The lowest concentration of SARS-CoV-2 viral copies this assay can detect is 138 copies/mL. A negative result does not preclude SARS-Cov-2 infection and should not be used as the sole basis for treatment or other patient management decisions. A negative result may occur with  improper specimen collection/handling, submission of specimen other than nasopharyngeal swab, presence of viral mutation(s) within the areas targeted by this assay, and inadequate number of viral copies(<138 copies/mL). A negative result must be combined with clinical observations, patient history, and epidemiological information. The expected result is Negative.  Fact Sheet for Patients:   EntrepreneurPulse.com.au  Fact Sheet for Healthcare Providers:  IncredibleEmployment.be  This test is no t yet approved or cleared by the [place] FDA and  has been authorized for detection and/or diagnosis of SARS-CoV-2 by FDA under an Emergency Use Authorization (EUA). This EUA will remain  in effect (meaning this test can be used) for the duration of the COVID-19 declaration under Section 564(b)(1) of the Act, 21 U.S.C.section 360bbb-3(b)(1), unless the authorization is terminated  or revoked sooner.       Influenza A by PCR NEGATIVE NEGATIVE Final   Influenza B by PCR NEGATIVE NEGATIVE Final    Comment: (NOTE) The Xpert Xpress SARS-CoV-2/FLU/RSV plus assay is intended as an aid in the diagnosis of influenza from Nasopharyngeal swab specimens and should not be used as a sole basis for treatment. Nasal washings and aspirates are unacceptable for Xpert Xpress SARS-CoV-2/FLU/RSV testing.  Fact Sheet for Patients: EntrepreneurPulse.com.au  Fact Sheet for Healthcare Providers: IncredibleEmployment.be  This test is not yet approved or cleared by the [place] FDA and has been authorized for detection and/or diagnosis of SARS-CoV-2 by FDA under an Emergency Use Authorization (EUA). This EUA will remain in effect (meaning this test can be used) for the duration of the COVID-19 declaration under Section 564(b)(1) of the Act, 21 U.S.C. section 360bbb-3(b)(1), unless the authorization is terminated or revoked.  Performed at [facility], [address] [place], [place] [address]   Body fluid culture     Status: None (Preliminary result)   Collection Time: 02/20/2020  9:05 AM   Specimen: Peritoneal Washings; Body Fluid  Result Value Ref Range Status   Specimen Description   Final    PERITONEAL Performed at [facility], [address] [place], [place] [address]    Special  Requests   Final    NONE Performed at [facility], [address] [place], [place] [address]    Gram Stain   Final    FEW WBC PRESENT, PREDOMINANTLY MONONUCLEAR NO ORGANISMS SEEN    Culture   Final    NO GROWTH 2 DAYS Performed at [facility], [address], [place], [place] [address]    Report Status PENDING  Incomplete  Body fluid culture     Status: None (Preliminary result)   Collection Time: 02/16/20  3:35 PM   Specimen: PATH Cytology Peritoneal fluid  Result Value Ref Range Status   Specimen Description   Final    PERITONEAL Performed at [facility], [address], [place], [place] [address]    Special Requests   Final    NONE Performed at [facility], [address] [place], [place] [address]    Gram Stain   Final    RARE WBC PRESENT, PREDOMINANTLY  MONONUCLEAR NO ORGANISMS SEEN Performed at [facility], [place] [address], [place], [place] [address]    Culture PENDING  Incomplete   Report Status PENDING  Incomplete    Coagulation Studies: Recent Labs    02/16/20 1049 02/17/20 0326  LABPROT 31.7* 31.7*  INR 3.2* 3.2*    Urinalysis: No results for input(s): COLORURINE, LABSPEC, PHURINE, GLUCOSEU, HGBUR, BILIRUBINUR, KETONESUR, PROTEINUR, UROBILINOGEN, NITRITE, LEUKOCYTESUR in the last 72 hours.  Invalid input(s): APPERANCEUR    Imaging: US Paracentesis  Result Date: 02/16/2020 INDICATION: Cirrhosis and ascites. EXAM: ULTRASOUND GUIDED PARACENTESIS MEDICATIONS: None. COMPLICATIONS: None immediate. PROCEDURE: Informed written consent was obtained from the patient's wife after a discussion of the risks, benefits and alternatives to treatment. A timeout was performed prior to the initiation of the procedure. Initial ultrasound was performed to localize ascites. The left lower abdomen was prepped and draped in the usual sterile fashion. 1% lidocaine was used for local anesthesia. Following this, a 6 Fr Safe-T-Centesis  catheter was introduced. An ultrasound image was saved for documentation purposes. The paracentesis was performed. The catheter was removed and a dressing was applied. The patient tolerated the procedure well without immediate post procedural complication. FINDINGS: A total of approximately 9.9 L of yellowish fluid was removed. Samples were sent to the laboratory as requested by the clinical team. IMPRESSION: Successful ultrasound-guided paracentesis yielding 9.9 liters of peritoneal fluid. Electronically Signed   By: Aletta Edouard M.D.   On: 02/16/2020 16:51     Medications:    sodium chloride     albumin human 12.5 g (02/17/20 1446)   cefTRIAXone (ROCEPHIN)  IV 2 g (02/17/20 1214)   furosemide (LASIX) 200 mg in dextrose 5% 100 mL (35m/mL) infusion 5 mg/hr (02/16/20 2251)    Chlorhexidine Gluconate Cloth  6 each Topical Daily   diltiazem  30 mg Oral Q8H   enoxaparin (LOVENOX) injection  0.5 mg/kg Subcutaneous Q24H   feeding supplement (NEPRO CARB STEADY)  237 mL Oral TID BM   ferrous sulfate  325 mg Oral Q breakfast   Gerhardt's butt cream   Topical BID   hydrocerin  1 application Topical Daily   insulin aspart  0-20 Units Subcutaneous TID WC   lactulose  20 g Oral TID   metoprolol tartrate  25 mg Oral BID   multivitamin with minerals  1 tablet Oral Daily   pantoprazole  40 mg Oral BID   pravastatin  20 mg Oral q1800   sodium chloride flush  3 mL Intravenous Q12H   sodium chloride, albuterol, ketoconazole, morphine injection, ondansetron **OR** ondansetron (ZOFRAN) IV, oxyCODONE, simethicone, sodium chloride flush, traZODone  Assessment/ Plan:  70y.o. male with past medical history of diabetes mellitus type 2, hypertension, BPH, atrial fibrillation, morbid obesity, cirrhosis, and chronic kidney disease stage IIIa.  1.  Acute kidney injury/chronic kidney disease stage IIIb baseline creatinine 1.6 with an EGFR of 42.  Suspect acute kidney injury now multifactorial  with contributions from poor p.o. intake at home as well as the potential for hepatorenal syndrome given underlying liver disease and use of diuretics for treatment of anasarca.  Patient clearly total body volume overloaded.  May have some element of intravascular volume depletion.  Hesitant to stop Lasix drip for now however.  If renal function continues to deteriorate may need to consider renal placement therapy though overall patient appears to have a guarded prognosis given his multiple comorbidities and underlying liver disease.  2.  Anemia of chronic kidney disease.  Hemoglobin  currently 9.0.  May need to consider Epogen.  3.  Liver cirrhosis.  Increase albumin to 25 g IV every 6 hours for now.   LOS: 2 Juli Odom 1/21/20223:55 PM

## 2020-02-17 NOTE — Progress Notes (Addendum)
[facility] Progress Note  Patient: Christian Berg    JME:[id number]  DOA: 02/01/2020     Date of Service: the patient was seen and examined on 02/17/2020  Chief Complaint  Patient presents with  . Abdominal Pain   Brief hospital course: Christian Berg is a 70 y.o. male with medical history significant for morbid obesity (bmi 43), history of liver cirrhosis, diabetes mellitus, A. fib who was brought into the emergency room by EMS for evaluation of mental status changes.  His wife states that over the last couple of days he has had very poor oral intake and has been weak and tired.  She also notes that over the last 24 hours he has been very forgetful and increasingly confused.  Patient is currently oriented to person and place but not to time which is unusual for him.  His wife is also concerned that his urine output has decreased over the last 24 to 48 hours and he has not had a bowel movement in 4 days.  She also notes generalized body swelling despite taking his diuretics. He denies having any fever or chills, no abdominal pain, no dysuria, no frequency, no diarrhea, no headache, no dizziness, no lightheadedness, no chest pain or shortness of breath, no cough, no nausea, no vomiting, no dizziness or lightheadedness. Labs show sodium 141, potassium 2.9, chloride 94, bicarb 31, glucose 103, BUN 26, creatinine 2.15 above the baseline of 1.6, calcium 8.4, albumin 2.1, AST 52, ALT 18, total protein 7.2, ammonia 49, total bilirubin 2.0, BNP 781, troponin 24, white count 6.5, hemoglobin 9.9, hematocrit 30.1, MCV 103, RDW 21, platelet count 188 Respiratory viral panel is negative Peritoneal fluid analysis reveals 116 nucleated cells CT scan of abdomen and pelvis shows cirrhosis with large volume ascites and anasarca. Small pleural effusions, greater on the left. Chest x-ray reviewed by me shows CHF Twelve-lead EKG reviewed by me shows A. fib with a rapid ventricular rate   ED Course: Patient  is a 70 year old Caucasian male who was brought to the ER by EMS for evaluation of confusion and poor oral intake.  Patient noted to have worsening of his renal function from baseline (1.6 >> 2.15), his ammonia level is elevated and he has not had a bowel movement in about 4 days.  He will be admitted to the hospital for further evaluation.    Assessment and Plan: Principal Problem:   Hepatic encephalopathy ([place]) Active Problems:   (HFpEF) heart failure with preserved ejection fraction (HCC)   Atrial fibrillation with RVR (HCC)   NASH (nonalcoholic steatohepatitis)   CKD stage 3 due to type 2 diabetes mellitus ([place])   Obesity   Hypokalemia    Hepatic encephalopathy Patient appears more forgetful and confused and is only oriented to person and place, not to time which is not his baseline Patient was constipated despite being on lactulose.   Continue lactulose 3 times a day Patient is empirically on Rocephin 2 g IV daily for presumed SBP Currently patient is moving bowels   Ascites due to cirrhosis, presented with anasarca Abdominal distention due to ascites and patient was complaining of pain and discomfort, Patient agreed for paracentesis S/p US guided paracentesis 9.9 L yellowish fluid was tapped IV albumin 12.5 g q6h x 4 doses after paracentesis started  IV Lasix infusion to diurese    AKI on CKD stage IIIb, due to DM and hepatorenal syndrome Continue to  monitor urine output and renal function Nephrology consulted Urology consulted and  Foley catheter was inserted    History of chronic HFpEF  TTE 08/05/2019 shows LVEF 665%, no wall motion abnormality, diastolic function indeterminate. Patient noted to have bilateral pleural effusions on imaging but he is not short of breath at this time Hold torsemide and metolazone due to worsening renal function (at baseline patient has a serum creatinine of 1.6 but today on admission it is 2.15) Maintain low-sodium diet Continue  metoprolol   Chronic permanent A. fib with rapid ventricular rate Continue metoprolol for rate control Patient not on long-term anticoagulation therapy due to increased risk for bleeding 1/21 started Cardizem 30 mg p.o. every 8 hourly  Diabetes mellitus with complications of stage III chronic kidney disease Maintain consistent carbohydrate diet Sliding scale insulin for glycemic control   Hypokalemia Secondary to diuretic use Supplement potassium magnesium levels 1.8 monitor and replete magnesium level   Morbid obesity Body mass index is 43.91 kg/m.  Complicates overall prognosis and care Interventions: Calorie restricted diet advised to lose body weight and daily exercise if possible         Diet: Carb modified DVT Prophylaxis: Subcutaneous Lovenox   Advance goals of care discussion: DNR  Family Communication: family was not present at bedside, at the time of interview.  The pt provided permission to discuss medical plan with the family. Opportunity was given to ask question and all questions were answered satisfactorily.   Disposition:  Pt is from Home, admitted with anasarca, hepatic encephalopathy and volume overload, which precludes a safe discharge. Discharge to Home, when condition improves.  Patient may need to stay for 3 to 5 days.  Subjective: No significant overnight issues, patient is still very confused, patient was having pain in the morning time which got resolved. Denied any other active issues. As per RN patient did not move bowel yesterday afternoon but he was not aware.    Physical Exam: General:  alert oriented to time and person, responds, still mildly confused Appear in mild distress, affect appropriate Eyes: PERRLA ENT: Oral Mucosa Clear, moist  Neck: no JVD,  Cardiovascular: S1 and S2 Present, no Murmur,  Respiratory: decreased respiratory effort, Bilateral Air entry equal and Decreased, bibasilar crackles, no significant wheezing  appreciated Abdomen: Bowel Sound present, distended due to ascites and edematous, generalized tenderness,  Skin: Disseminated superficial actinic keratosis Extremities: 4+ Pedal edema, no calf tenderness Neurologic: without any new focal findings Gait not checked due to patient safety concerns  Vitals:   02/17/20 0412 02/17/20 0745 02/17/20 0942 02/17/20 1156  BP: 115/71 126/74 119/75 (!) 100/46  Pulse: (!) 135 (!) 117 (!) 109 (!) 109  Resp: 18 20  18   Temp: 98.1 F (36.7 C) 98.2 F (36.8 C)  98 F (36.7 C)  TempSrc: Oral Oral    SpO2: 100% 100%  100%  Weight: 122.2 kg     Height:        Intake/Output Summary (Last 24 hours) at 02/17/2020 1423 Last data filed at 02/17/2020 1300 Gross per 24 hour  Intake 791.1 ml  Output 300 ml  Net 491.1 ml   Filed Weights   02/14/20 2210 02/17/20 0412  Weight: 131 kg 122.2 kg    Data Reviewed: I have personally reviewed and interpreted daily labs, tele strips, imagings as discussed above. I reviewed all nursing notes, pharmacy notes, vitals, pertinent old records I have discussed plan of care as described above with RN and patient/family.  CBC: Recent Labs  Lab 02/14/20 2214 02/16/20 0404 02/17/20 0326  WBC 6.5  7.2 10.6*  HGB 9.9* 8.9* 9.0*  HCT 30.1* 27.0* 26.8*  MCV 103.1* 100.7* 101.5*  PLT 188 165 161*   Basic Metabolic Panel: Recent Labs  Lab 02/14/20 2214 02/22/2020 0048 02/16/20 0404 02/16/20 1049 02/17/20 0326  NA 141  --  142  --  140  K 2.9*  --  2.9*  --  3.2*  CL 94*  --  98  --  95*  CO2 31  --  32  --  29  GLUCOSE 103*  --  99  --  107*  BUN 26*  --  26*  --  28*  CREATININE 2.15*  --  2.13*  --  2.31*  CALCIUM 8.4*  --  7.8*  --  7.9*  MG  --  2.0  --   --  1.8  PHOS  --   --   --  3.2 3.1    Studies: US Paracentesis  Result Date: 02/16/2020 INDICATION: Cirrhosis and ascites. EXAM: ULTRASOUND GUIDED PARACENTESIS MEDICATIONS: None. COMPLICATIONS: None immediate. PROCEDURE: Informed written consent  was obtained from the patient's wife after a discussion of the risks, benefits and alternatives to treatment. A timeout was performed prior to the initiation of the procedure. Initial ultrasound was performed to localize ascites. The left lower abdomen was prepped and draped in the usual sterile fashion. 1% lidocaine was used for local anesthesia. Following this, a 6 Fr Safe-T-Centesis catheter was introduced. An ultrasound image was saved for documentation purposes. The paracentesis was performed. The catheter was removed and a dressing was applied. The patient tolerated the procedure well without immediate post procedural complication. FINDINGS: A total of approximately 9.9 L of yellowish fluid was removed. Samples were sent to the laboratory as requested by the clinical team. IMPRESSION: Successful ultrasound-guided paracentesis yielding 9.9 liters of peritoneal fluid. Electronically Signed   By: Aletta Edouard M.D.   On: 02/16/2020 16:51    Scheduled Meds: . Chlorhexidine Gluconate Cloth  6 each Topical Daily  . diltiazem  30 mg Oral Q8H  . enoxaparin (LOVENOX) injection  0.5 mg/kg Subcutaneous Q24H  . ferrous sulfate  325 mg Oral Q breakfast  . Gerhardt's butt cream   Topical BID  . hydrocerin  1 application Topical Daily  . insulin aspart  0-20 Units Subcutaneous TID WC  . lactulose  20 g Oral TID  . metoprolol tartrate  25 mg Oral BID  . multivitamin with minerals  1 tablet Oral Daily  . pantoprazole  40 mg Oral BID  . pravastatin  20 mg Oral q1800  . sodium chloride flush  3 mL Intravenous Q12H   Continuous Infusions: . sodium chloride    . albumin human 12.5 g (02/17/20 1012)  . cefTRIAXone (ROCEPHIN)  IV 2 g (02/17/20 1214)  . furosemide (LASIX) 200 mg in dextrose 5% 100 mL (17m/mL) infusion 5 mg/hr (02/16/20 2251)   PRN Meds: sodium chloride, albuterol, ketoconazole, morphine injection, ondansetron **OR** ondansetron (ZOFRAN) IV, oxyCODONE, simethicone, sodium chloride flush,  traZODone  Time spent: 35 minutes  Author: DVal Riles MD [facility] 02/17/2020 2:23 PM  To reach On-call, see care teams to locate the attending and reach out to them via [email or web address] If 7PM-7AM, please contact night-coverage If you still have difficulty reaching the attending provider, please page the [facility](Director on Call) for [facility] on amion for assistance.

## 2020-02-17 NOTE — Progress Notes (Signed)
Initial Nutrition Assessment  DOCUMENTATION CODES:   Morbid obesity  INTERVENTION:   Nepro Shake po TID, each supplement provides 425 kcal and 19 grams protein  MVI daily   Pt at high refeed risk; recommend monitor potassium, magnesium and phosphorus labs daily until stable  NUTRITION DIAGNOSIS:   Inadequate oral intake related to poor appetite as evidenced by meal completion < 25%.  GOAL:   Patient will meet greater than or equal to 90% of their needs  MONITOR:   PO intake,Supplement acceptance,Labs,Weight trends,Skin,I & O's  REASON FOR ASSESSMENT:   Malnutrition Screening Tool    ASSESSMENT:   70 y.o. male with medical history significant for morbid obesity (bmi 43), history of liver cirrhosis, diabetes mellitus, A. fib who was brought into the emergency room by EMS for evaluation of mental status   Met with pt in room today. Pt is a poor historian and is having some confusion so unable to obtain a detailed nutrition related history. Pt reports poor appetite and oral intake pta and in hospital. Pt reports that he has not eaten anything today. Pt reports that he is willing to try and drink butter pecan Nepro supplements in hospital; pt does not like Ensure. Per chart, pt is down 20lbs(7%) in < 2 months; this is significant. Pt is noted to have some constipation despite lactulose. Palliative care consult pending.    Medications reviewed and include: lovenox, ferrous sulfate, insulin, lactulose, MVI, protonix, albumin, ceftriaxone, lasix  Labs reviewed: K 3.2(L), BUN 28(H), creat 2.31(H), P 3.1 wnl, Mg 1.8 wnl, tbili 1.8(H) Wbc- 10.6(H), Hgb 9.0(L), Hct 26.8(L), MCV 101.5(H), MCH 34.1(H) cbgs- 104, 113 x 24 hrs  NUTRITION - FOCUSED PHYSICAL EXAM:  Flowsheet Row Most Recent Value  Orbital Region No depletion  Upper Arm Region No depletion  Thoracic and Lumbar Region No depletion  Buccal Region No depletion  Temple Region No depletion  Clavicle Bone Region No  depletion  Clavicle and Acromion Bone Region No depletion  Scapular Bone Region No depletion  Dorsal Hand No depletion  Patellar Region No depletion  Anterior Thigh Region No depletion  Posterior Calf Region No depletion  Edema (RD Assessment) Moderate  Hair Reviewed  Eyes Reviewed  Mouth Reviewed  Skin Reviewed  Nails Reviewed     Diet Order:   Diet Order            Diet Carb Modified Fluid consistency: Thin; Room service appropriate? Yes  Diet effective now                EDUCATION NEEDS:   Education needs have been addressed  Skin:  Skin Assessment: Reviewed RN Assessment (2 cm x 4 cm x 0.1 cm- sacrum)  Last BM:  1/20- constipation   Height:   Ht Readings from Last 1 Encounters:  02/14/20 5' 8" (1.727 m)    Weight:   Wt Readings from Last 1 Encounters:  02/17/20 122.2 kg    Ideal Body Weight:  70 kg  BMI:  Body mass index is 40.96 kg/m.  Estimated Nutritional Needs:   Kcal:  2500-2800kcal/day  Protein:  >125g/day  Fluid:  1.8-2.1L/day    MS, RD, LDN Please refer to AMION for RD and/or RD on-call/weekend/after hours pager  

## 2020-02-18 ENCOUNTER — Inpatient Hospital Stay: Payer: Self-pay

## 2020-02-18 ENCOUNTER — Encounter: Payer: Self-pay | Admitting: Internal Medicine

## 2020-02-18 ENCOUNTER — Inpatient Hospital Stay: Payer: Medicare HMO

## 2020-02-18 DIAGNOSIS — K7290 Hepatic failure, unspecified without coma: Secondary | ICD-10-CM | POA: Diagnosis not present

## 2020-02-18 LAB — GLUCOSE, CAPILLARY
Glucose-Capillary: 110 mg/dL — ABNORMAL HIGH (ref 70–99)
Glucose-Capillary: 137 mg/dL — ABNORMAL HIGH (ref 70–99)
Glucose-Capillary: 129 mg/dL — ABNORMAL HIGH (ref 70–99)
Glucose-Capillary: 124 mg/dL — ABNORMAL HIGH (ref 70–99)
Glucose-Capillary: 134 mg/dL — ABNORMAL HIGH (ref 70–99)

## 2020-02-18 LAB — MAGNESIUM: Magnesium: 2 mg/dL (ref 1.7–2.4)

## 2020-02-18 LAB — BASIC METABOLIC PANEL
Anion gap: 15 (ref 5–15)
BUN: 32 mg/dL — ABNORMAL HIGH (ref 8–23)
CO2: 30 mmol/L (ref 22–32)
Calcium: 8 mg/dL — ABNORMAL LOW (ref 8.9–10.3)
Chloride: 95 mmol/L — ABNORMAL LOW (ref 98–111)
Creatinine, Ser: 2.45 mg/dL — ABNORMAL HIGH (ref 0.61–1.24)
GFR, Estimated: 27 mL/min — ABNORMAL LOW (ref >60)
Glucose, Bld: 134 mg/dL — ABNORMAL HIGH (ref 70–99)
Potassium: 3.4 mmol/L — ABNORMAL LOW (ref 3.5–5.1)
Sodium: 140 mmol/L (ref 135–145)

## 2020-02-18 LAB — CBC
HCT: 25.3 % — ABNORMAL LOW (ref 39.0–52.0)
Hemoglobin: 8.3 g/dL — ABNORMAL LOW (ref 13.0–17.0)
MCH: 33.5 pg (ref 26.0–34.0)
MCHC: 32.8 g/dL (ref 30.0–36.0)
MCV: 102 fL — ABNORMAL HIGH (ref 80.0–100.0)
Platelets: 145 10*3/uL — ABNORMAL LOW (ref 150–400)
RBC: 2.48 MIL/uL — ABNORMAL LOW (ref 4.22–5.81)
RDW: 20.9 % — ABNORMAL HIGH (ref 11.5–15.5)
WBC: 11.5 10*3/uL — ABNORMAL HIGH (ref 4.0–10.5)
nRBC: 0 % (ref 0.0–0.2)

## 2020-02-18 LAB — PROTIME-INR
INR: 3.4 — ABNORMAL HIGH (ref 0.8–1.2)
Prothrombin Time: 32.9 seconds — ABNORMAL HIGH (ref 11.4–15.2)

## 2020-02-18 LAB — HEPATIC FUNCTION PANEL
ALT: 16 U/L (ref 0–44)
AST: 40 U/L (ref 15–41)
Albumin: 2.6 g/dL — ABNORMAL LOW (ref 3.5–5.0)
Alkaline Phosphatase: 96 U/L (ref 38–126)
Bilirubin, Direct: 0.7 mg/dL — ABNORMAL HIGH (ref 0.0–0.2)
Indirect Bilirubin: 1.1 mg/dL — ABNORMAL HIGH (ref 0.3–0.9)
Total Bilirubin: 1.8 mg/dL — ABNORMAL HIGH (ref 0.3–1.2)
Total Protein: 6.4 g/dL — ABNORMAL LOW (ref 6.5–8.1)

## 2020-02-18 LAB — BODY FLUID CULTURE: Culture: NO GROWTH

## 2020-02-18 LAB — AMMONIA: Ammonia: 48 umol/L — ABNORMAL HIGH (ref 9–35)

## 2020-02-18 LAB — PHOSPHORUS: Phosphorus: 2.9 mg/dL (ref 2.5–4.6)

## 2020-02-18 MED ORDER — PHENOL 1.4 % MT LIQD
1.0000 | OROMUCOSAL | Status: DC | PRN
  Filled 2020-02-18: qty 177

## 2020-02-18 MED ORDER — ENOXAPARIN SODIUM 60 MG/0.6ML ~~LOC~~ SOLN
0.5000 mg/kg | SUBCUTANEOUS | Status: DC
  Administered 2020-02-19 – 2020-02-20 (×2): 60 mg via SUBCUTANEOUS
  Filled 2020-02-18 (×2): qty 0.6

## 2020-02-18 MED ORDER — DILTIAZEM HCL 25 MG/5ML IV SOLN
10.0000 mg | Freq: Four times a day (QID) | INTRAVENOUS | Status: DC | PRN
  Administered 2020-02-18 – 2020-02-20 (×4): 10 mg via INTRAVENOUS
  Filled 2020-02-18 (×4): qty 5

## 2020-02-18 MED ORDER — PANTOPRAZOLE SODIUM 40 MG IV SOLR
40.0000 mg | Freq: Two times a day (BID) | INTRAVENOUS | Status: DC
  Administered 2020-02-18 – 2020-02-21 (×6): 40 mg via INTRAVENOUS
  Filled 2020-02-18 (×6): qty 40

## 2020-02-18 MED ORDER — MIDODRINE HCL 5 MG PO TABS
5.0000 mg | ORAL_TABLET | Freq: Three times a day (TID) | ORAL | Status: DC | PRN
  Administered 2020-02-20: 5 mg via ORAL
  Filled 2020-02-18: qty 1

## 2020-02-18 MED ORDER — SODIUM CHLORIDE 0.9% FLUSH
10.0000 mL | Freq: Two times a day (BID) | INTRAVENOUS | Status: DC
  Administered 2020-02-18 – 2020-02-20 (×5): 10 mL

## 2020-02-18 MED ORDER — SODIUM CHLORIDE 0.9% FLUSH
10.0000 mL | INTRAVENOUS | Status: DC | PRN
Start: 2020-02-18 — End: 2020-02-22
  Administered 2020-02-19: 10 mL

## 2020-02-18 NOTE — Progress Notes (Signed)
Peripherally Inserted Central Catheter Placement  The IV Nurse has discussed with the patient and/or persons authorized to consent for the patient, the purpose of this procedure and the potential benefits and risks involved with this procedure.  The benefits include less needle sticks, lab draws from the catheter, and the patient may be discharged home with the catheter. Risks include, but not limited to, infection, bleeding, blood clot (thrombus formation), and puncture of an artery; nerve damage and irregular heartbeat and possibility to perform a PICC exchange if needed/ordered by physician.  Alternatives to this procedure were also discussed.  Bard Power PICC patient education guide, fact sheet on infection prevention and patient information card has been provided to patient /or left at bedside.    PICC Placement Documentation  PICC Double Lumen 02/18/20 PICC Right Brachial 42 cm 0 cm (Active)  Indication for Insertion or Continuance of Line Limited venous access - need for IV therapy >5 days (PICC only);Poor Vasculature-patient has had multiple peripheral attempts or PIVs lasting less than 24 hours 02/18/20 1825  Exposed Catheter (cm) 0 cm 02/18/20 1825  Site Assessment Clean;Dry;Intact 02/18/20 1825  Lumen #1 Status Flushed;Saline locked;Blood return noted 02/18/20 1825  Lumen #2 Status Flushed;Saline locked;Blood return noted 02/18/20 1825  Dressing Type Transparent 02/18/20 1825  Dressing Status Clean;Dry;Intact 02/18/20 1825  Antimicrobial disc in place? Yes 02/18/20 1825  Safety Lock Not Applicable 93/11/21 6244  Line Care Connections checked and tightened 02/18/20 1825  Line Adjustment (NICU/IV Team Only) No 02/18/20 1825  Dressing Intervention New dressing 02/18/20 1825  Dressing Change Due 02/25/20 02/18/20 1825       Malachi Suderman, Nicolette Bang 02/18/2020, 6:26 PM

## 2020-02-18 NOTE — Progress Notes (Signed)
Walked into room, patient pulled out ng tube. Reinserted new 16 F NG TUBE at 53 cm. Chest xray put in to verify placement

## 2020-02-18 NOTE — Progress Notes (Signed)
Telephone consent obtained from wife.

## 2020-02-18 NOTE — Progress Notes (Signed)
[facility] Progress Note  Patient: Christian Berg    LAG:[id number]  DOA: 01/31/2020     Date of Service: the patient was seen and examined on 02/18/2020  Chief Complaint  Patient presents with  . Abdominal Pain   Brief hospital course: ORLANDA LEMMERMAN is a 70 y.o. male with medical history significant for morbid obesity (bmi 43), history of liver cirrhosis, diabetes mellitus, A. fib who was brought into the emergency room by EMS for evaluation of mental status changes.  His wife states that over the last couple of days he has had very poor oral intake and has been weak and tired.  She also notes that over the last 24 hours he has been very forgetful and increasingly confused.  Patient is currently oriented to person and place but not to time which is unusual for him.  His wife is also concerned that his urine output has decreased over the last 24 to 48 hours and he has not had a bowel movement in 4 days.  She also notes generalized body swelling despite taking his diuretics. He denies having any fever or chills, no abdominal pain, no dysuria, no frequency, no diarrhea, no headache, no dizziness, no lightheadedness, no chest pain or shortness of breath, no cough, no nausea, no vomiting, no dizziness or lightheadedness. Labs show sodium 141, potassium 2.9, chloride 94, bicarb 31, glucose 103, BUN 26, creatinine 2.15 above the baseline of 1.6, calcium 8.4, albumin 2.1, AST 52, ALT 18, total protein 7.2, ammonia 49, total bilirubin 2.0, BNP 781, troponin 24, white count 6.5, hemoglobin 9.9, hematocrit 30.1, MCV 103, RDW 21, platelet count 188 Respiratory viral panel is negative Peritoneal fluid analysis reveals 116 nucleated cells CT scan of abdomen and pelvis shows cirrhosis with large volume ascites and anasarca. Small pleural effusions, greater on the left. Chest x-ray reviewed by me shows CHF Twelve-lead EKG reviewed by me shows A. fib with a rapid ventricular rate   ED Course: Patient  is a 70 year old Caucasian male who was brought to the ER by EMS for evaluation of confusion and poor oral intake.  Patient noted to have worsening of his renal function from baseline (1.6 >> 2.15), his ammonia level is elevated and he has not had a bowel movement in about 4 days.  He will be admitted to the hospital for further evaluation.    Assessment and Plan: Principal Problem:   Hepatic encephalopathy ([place]) Active Problems:   (HFpEF) heart failure with preserved ejection fraction (HCC)   Atrial fibrillation with RVR (HCC)   NASH (nonalcoholic steatohepatitis)   CKD stage 3 due to type 2 diabetes mellitus (HCC)   Obesity   Hypokalemia    ileus or early/partial small bowel Obstruction. 1/22 patient developed severe abdominal pain and nausea vomiting, still no BM so suspected small bowel obstruction CT scan abdominal pelvis reviewed Keep n.p.o. NG tube with intermittent suction General surgery consulted for further recommendation   Hepatic encephalopathy Patient appears more forgetful and confused and is only oriented to person and place, not to time which is not his baseline Patient was constipated despite being on lactulose.   Continue lactulose 3 times a day Patient is empirically on Rocephin 2 g IV daily for presumed SBP Currently patient is moving bowels   Ascites due to cirrhosis, presented with anasarca Abdominal distention due to ascites and patient was complaining of pain and discomfort, Patient agreed for paracentesis S/p US guided paracentesis 9.9 L yellowish fluid was tapped IV albumin  12.5 g q6h x 4 doses after paracentesis started  IV Lasix infusion to diurese    AKI on CKD stage IIIb, due to DM and hepatorenal syndrome Continue to  monitor urine output and renal function Nephrology consulted Urology consulted and Foley catheter was inserted    History of chronic HFpEF  TTE 08/05/2019 shows LVEF 60-65%, no wall motion abnormality, diastolic function  indeterminate. Patient noted to have bilateral pleural effusions on imaging but he is not short of breath at this time Hold torsemide and metolazone due to worsening renal function (at baseline patient has a serum creatinine of 1.6 but today on admission it is 2.15) Maintain low-sodium diet Continue metoprolol   Chronic permanent A. fib with rapid ventricular rate Continue metoprolol for rate control Patient not on long-term anticoagulation therapy due to increased risk for bleeding 1/21 started Cardizem 30 mg p.o. every 8 hourly 1/22 use Cardizem IV as needed, as patient was not able to take anything p.o. due to SBO  Diabetes mellitus with complications of stage III chronic kidney disease Maintain consistent carbohydrate diet Sliding scale insulin for glycemic control   Hypokalemia Secondary to diuretic use Supplement potassium magnesium levels 1.8 monitor and replete magnesium level   Morbid obesity Body mass index is 43.91 kg/m.  Complicates overall prognosis and care Interventions: Calorie restricted diet advised to lose body weight and daily exercise if possible         Diet: Carb modified DVT Prophylaxis: Subcutaneous Lovenox   Advance goals of care discussion: DNR  Family Communication: family was not present at bedside, at the time of interview.  The pt provided permission to discuss medical plan with the family. Opportunity was given to ask question and all questions were answered satisfactorily.   Disposition:  Pt is from Home, admitted with anasarca, hepatic encephalopathy and volume overload, which precludes a safe discharge. Discharge to Home, when condition improves.  Patient may need to stay for 3 to 5 days.  Subjective: No significant overnight issues, in the morning time patient was unable to keep anything down, persistent vomiting and was complaining of abdominal pain.  No BM yet. I suspected possibility of SBO, CT scan shows possibility of  partial SBO So patient is being managed as above   Physical Exam: General:  alert oriented to time and person, responds, still mildly confused Appear in mild distress, affect appropriate Eyes: PERRLA ENT: Oral Mucosa Clear, moist  Neck: no JVD,  Cardiovascular: S1 and S2 Present, no Murmur,  Respiratory: decreased respiratory effort, Bilateral Air entry equal and Decreased, bibasilar crackles, no significant wheezing appreciated Abdomen: Bowel Sound present, distended due to ascites and edematous, generalized tenderness,  Skin: Disseminated superficial actinic keratosis Extremities: 4+ Pedal edema, no calf tenderness Neurologic: without any new focal findings Gait not checked due to patient safety concerns  Vitals:   02/18/20 0410 02/18/20 0648 02/18/20 0921 02/18/20 1227  BP: 119/64 123/77 113/70 (!) 106/44  Pulse: (!) 105 (!) 109 (!) 108 (!) 104  Resp: 20  20 19   Temp: 98.4 F (36.9 C)  97.8 F (36.6 C) 97.6 F (36.4 C)  TempSrc: Oral  Oral Oral  SpO2: 98%  100% 98%  Weight:      Height:        Intake/Output Summary (Last 24 hours) at 02/18/2020 1535 Last data filed at 02/18/2020 1500 Gross per 24 hour  Intake 398.35 ml  Output 1150 ml  Net -751.65 ml   Filed Weights   02/14/20 2210 02/17/20  0177  Weight: 131 kg 122.2 kg    Data Reviewed: I have personally reviewed and interpreted daily labs, tele strips, imagings as discussed above. I reviewed all nursing notes, pharmacy notes, vitals, pertinent old records I have discussed plan of care as described above with RN and patient/family.  CBC: Recent Labs  Lab 02/14/20 2214 02/16/20 0404 02/17/20 0326 02/18/20 0620  WBC 6.5 7.2 10.6* 11.5*  HGB 9.9* 8.9* 9.0* 8.3*  HCT 30.1* 27.0* 26.8* 25.3*  MCV 103.1* 100.7* 101.5* 102.0*  PLT 188 165 137* 939*   Basic Metabolic Panel: Recent Labs  Lab 02/14/20 2214 02/16/2020 0048 02/16/20 0404 02/16/20 1049 02/17/20 0326 02/18/20 0620  NA 141  --  142  --  140  140  K 2.9*  --  2.9*  --  3.2* 3.4*  CL 94*  --  98  --  95* 95*  CO2 31  --  32  --  29 30  GLUCOSE 103*  --  99  --  107* 134*  BUN 26*  --  26*  --  28* 32*  CREATININE 2.15*  --  2.13*  --  2.31* 2.45*  CALCIUM 8.4*  --  7.8*  --  7.9* 8.0*  MG  --  2.0  --   --  1.8 2.0  PHOS  --   --   --  3.2 3.1 2.9    Studies: CT ABDOMEN PELVIS WO CONTRAST  Result Date: 02/18/2020 CLINICAL DATA:  Patient with abdominal distension. Evaluate for obstruction. EXAM: CT ABDOMEN AND PELVIS WITHOUT CONTRAST TECHNIQUE: Multidetector CT imaging of the abdomen and pelvis was performed following the standard protocol without IV contrast. COMPARISON:  CT abdomen pelvis 02/02/2020. FINDINGS: Lower chest: Heart is enlarged. Aortic vascular calcifications. Moderate left and small right pleural effusions. Patchy ground-glass and consolidative opacities within the right upper lobe. Dependent consolidative opacities within the lower lobes bilaterally. Hepatobiliary: The liver is small and nodular in contour most compatible with cirrhosis. No intrahepatic or extrahepatic biliary ductal dilatation. Pancreas: Unremarkable Spleen: Unremarkable Adrenals/Urinary Tract: Normal adrenal glands. Kidneys are symmetric in size. Two small stones inferior pole left kidney. Urinary bladder is decompressed with a Foley catheter. Stomach/Bowel: There are prominent and distended loops of small bowel within the left hemiabdomen measuring up to 4.4 cm (image 50; series 3). There is gradual transition to more decompressed small bowel noted distally. Normal morphology of the stomach. Vascular/Lymphatic: Normal caliber abdominal aorta. Peripheral calcified atherosclerotic plaque. No retroperitoneal lymphadenopathy. Multiple mesenteric varices. Reproductive: Heterogeneous prostate. Other: Small bilateral fat containing inguinal hernias. Soft tissue anasarca. Large volume ascites. No free intraperitoneal air. Musculoskeletal: 2 degenerative changes.   No acute process. IMPRESSION: 1. There are prominent and distended loops of small bowel within the left hemiabdomen measuring up to 4.4 cm. There is gradual transition to more decompressed small bowel distally. Findings are nonspecific however may represent ileus or early/partial small bowel obstruction. 2. Findings compatible with cirrhosis and portal venous hypertension including large volume ascites. 3. Moderate left and small right pleural effusions. 4. Patchy ground-glass and consolidative opacities within the right upper lobe and bilateral lower lobes which may represent infection or edema. Recommend follow-up chest CT in 4-6 weeks after resolution of acute symptomatology to ensure resolution. 5. Nonobstructing left nephrolithiasis. 6. Aortic atherosclerosis. Electronically Signed   By: Lovey Newcomer M.D.   On: 02/18/2020 12:28   [place] EKG SITE RITE  Result Date: 02/18/2020 If [facility] image not attached, placement could not be  confirmed due to current cardiac rhythm.   Scheduled Meds: . Chlorhexidine Gluconate Cloth  6 each Topical Daily  . diltiazem  30 mg Oral Q8H  . [START ON 02/19/2020] enoxaparin (LOVENOX) injection  0.5 mg/kg Subcutaneous Q24H  . feeding supplement (NEPRO CARB STEADY)  237 mL Oral TID BM  . ferrous sulfate  325 mg Oral Q breakfast  . Gerhardt's butt cream   Topical BID  . hydrocerin  1 application Topical Daily  . insulin aspart  0-20 Units Subcutaneous TID WC  . lactulose  20 g Oral TID  . metoprolol tartrate  25 mg Oral BID  . multivitamin with minerals  1 tablet Oral Daily  . pantoprazole (PROTONIX) IV  40 mg Intravenous Q12H  . pravastatin  20 mg Oral q1800  . sodium chloride flush  3 mL Intravenous Q12H   Continuous Infusions: . sodium chloride    . cefTRIAXone (ROCEPHIN)  IV Stopped (02/18/20 1010)  . furosemide (LASIX) 200 mg in dextrose 5% 100 mL (49m/mL) infusion 5 mg/hr (02/18/20 1123)   PRN Meds: sodium chloride, albuterol, diltiazem, ketoconazole,  midodrine, morphine injection, ondansetron **OR** ondansetron (ZOFRAN) IV, oxyCODONE, phenol, simethicone, sodium chloride flush, traZODone  Time spent: 35 minutes  Author: DVal Riles MD [facility] 02/18/2020 3:35 PM  To reach On-call, see care teams to locate the attending and reach out to them via [email or web address] If 7PM-7AM, please contact night-coverage If you still have difficulty reaching the attending provider, please page the [facility](Director on Call) for [facility] on amion for assistance.

## 2020-02-18 NOTE — Progress Notes (Signed)
   02/18/20 1000  Clinical Encounter Type  Visited With Patient  Visit Type Initial  Referral From Nurse  Consult/Referral To Chaplain   As the chaplain on call I responded to a consult stating the patient requested prayer. When I arrived he was not open to a visit.   [place], [place]

## 2020-02-18 NOTE — Progress Notes (Signed)
Patient's right arm with swelling and bruising lateral and distal present prior to PICC insertion.  Weeping with dependent edema present to axilla.

## 2020-02-18 NOTE — Progress Notes (Signed)
Pt is confused at this time, attempting to use the call light as a phone, oxygen tubing not on patient, refuses enema at this time. Replaced oxygen and assisted pt calling wife. Pt able to talk to wife. Pt alert to wife's name, telephone number and place.

## 2020-02-18 NOTE — Progress Notes (Signed)
Made Dr. Dwyane Dee aware patient is unable to keep anything down, two bites of ice cream this am, vomiting green liquid bile colored emesis, hypoactive bowel sounds. Last BM 1/20

## 2020-02-18 NOTE — Progress Notes (Signed)
Pt states will complete enema at this time, pt able to roll toward left side with assistance, enema completed. Pt unable to hold fluid, water from rectum during enema pink tinge in color noted. Pt placed on bedpan with no results. Pt has a small amount of blood noted to RLQ, cleaned and band-aide placed. Pt request water, mouth swabs provided, pt able to swab mouth independently.

## 2020-02-18 NOTE — Consult Note (Signed)
Subjective:   CC: Partial small bowel obstruction  HPI:  Christian Berg is a 70 y.o. male who was consulted by Dwyane Dee for issue above.  History obtained via chart check secondary to patient confusion.  Reports of emesis noted this morning as well from the nurse.  CT scan concerning for partial small bowel obstruction so surgery consulted.  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  Past Surgical History:  Past Surgical History:  Procedure Laterality Date  . APPENDECTOMY  2004  . CARDIAC CATHETERIZATION    . CARDIAC PACEMAKER PLACEMENT  2005  . CATARACT EXTRACTION W/PHACO Right 06/14/2014   Procedure: CATARACT EXTRACTION PHACO AND INTRAOCULAR LENS PLACEMENT (IOC);  Surgeon: Leandrew Koyanagi, MD;  Location: [facility];  Service: Ophthalmology;  Laterality: Right;  . CATARACT EXTRACTION W/PHACO Left 10/27/2018   Procedure: CATARACT EXTRACTION PHACO AND INTRAOCULAR LENS PLACEMENT (IOC) LEFT DIABETIC  00:46.4  18.6%  8.63;  Surgeon: Leandrew Koyanagi, MD;  Location: [place];  Service: Ophthalmology;  Laterality: Left;  Diabetic - oral meds  . CYSTOSCOPY W/ RETROGRADES Bilateral 08/09/2014   Procedure: CYSTOSCOPY WITH RETROGRADE PYELOGRAM;  Surgeon: Hollice Espy, MD;  Location: [facility];  Service: Urology;  Laterality: Bilateral;  . ESOPHAGOGASTRODUODENOSCOPY (EGD) WITH PROPOFOL N/A 10/13/2019   Procedure:  ESOPHAGOGASTRODUODENOSCOPY (EGD) WITH PROPOFOL;  Surgeon: Lesly Rubenstein, MD;  Location: [facility];  Service: Endoscopy;  Laterality: N/A;  . INTUBATION-ENDOTRACHEAL WITH TRACHEOSTOMY STANDBY  03/30/2017   Procedure: INTUBATION-ENDOTRACHEAL WITH TRACHEOSTOMY STANDBY;  Surgeon: Carloyn Manner, MD;  Location: [facility];  Service: ENT;;    Family History: family history includes Fibromyalgia in his sister; Heart disease in his mother; Hypertension in his daughter; Migraines in his daughter. He was adopted.  Social History:  reports that he has never smoked. He has never used smokeless tobacco. He reports that he does not drink alcohol and does not use drugs.  Current Medications:  Prior to Admission medications   Medication Sig Start Date End Date Taking? Authorizing Provider  albuterol (VENTOLIN HFA) 108 (90 Base) MCG/ACT inhaler Inhale 2 puffs into the lungs every 6 (six) hours as needed for wheezing or shortness of breath. 01/30/20  Yes Birdie Sons, MD  lovastatin (MEVACOR) 20 MG tablet Take 1 tablet (20 mg total) by mouth every evening. 04/08/19  Yes Birdie Sons, MD  metFORMIN (GLUCOPHAGE) 1000 MG tablet Take 1 tablet by mouth twice daily Patient taking differently: Take 500 mg by mouth daily. 08/29/19  Yes Birdie Sons, MD  metoprolol tartrate (LOPRESSOR) 50 MG tablet Take 1 tablet (50 mg total) by mouth 2 (two) times daily. Patient taking differently: Take 100 mg by mouth daily. 10/23/19  Yes Ezekiel Slocumb, DO  Multiple Vitamin (MULTIVITAMIN WITH MINERALS) TABS tablet Take 1 tablet by mouth daily. 10/24/19  Yes Nicole Kindred A, DO  torsemide (DEMADEX) 10 MG tablet Take 1-2 tablets (10-20 mg total) by mouth daily. 01/04/20  Yes Birdie Sons, MD  traMADol-acetaminophen (ULTRACET) 37.5-325 MG tablet Take 1 tablet by mouth every 6 (six) hours as needed for severe pain. 08/06/19  Yes Wieting, Richard,  MD  warfarin (COUMADIN) 5 MG tablet Take 5 mg by mouth 2 (two) times a  week. 11/05/19  Yes [provider]    Allergies:  Allergies as of 02/14/2020 - Review Complete 02/14/2020  Allergen Reaction Noted  . Clindamycin/lincomycin  11/03/2016  . Sulfa antibiotics Hives 05/31/2014  . Sulfasalazine Hives 05/31/2014    ROS:  Unobtainable secondary to patient mental status    Objective:     BP 105/69 (BP Location: Right Arm)   Pulse (!) 118   Temp 97.6 F (36.4 C) (Oral)   Resp 18   Ht 5' 8"  (1.727 m)   Wt 122.2 kg   SpO2 98%   BMI 40.96 kg/m   Constitutional :  mild distress and Confused  Lymphatics/Throat:  no asymmetry, masses, or scars  Respiratory:  clear to auscultation bilaterally  Cardiovascular:  regular rate and rhythm  Gastrointestinal: Soft, no guarding, no focal tenderness to palpation.  Diffuse anasarca and distention.   Musculoskeletal: Steady movement  Skin: Cool and moist   Psychiatric: Normal affect, non-agitated, not confused       LABS:  CMP Latest Ref Rng & Units 02/18/2020 02/17/2020 02/16/2020  Glucose 70 - 99 mg/dL 134(H) 107(H) 99  BUN 8 - 23 mg/dL 32(H) 28(H) 26(H)  Creatinine 0.61 - 1.24 mg/dL 2.45(H) 2.31(H) 2.13(H)  Sodium 135 - 145 mmol/L 140 140 142  Potassium 3.5 - 5.1 mmol/L 3.4(L) 3.2(L) 2.9(L)  Chloride 98 - 111 mmol/L 95(L) 95(L) 98  CO2 22 - 32 mmol/L 30 29 32  Calcium 8.9 - 10.3 mg/dL 8.0(L) 7.9(L) 7.8(L)  Total Protein 6.5 - 8.1 g/dL 6.4(L) 6.2(L) -  Total Bilirubin 0.3 - 1.2 mg/dL 1.8(H) 1.8(H) -  Alkaline Phos 38 - 126 U/L 96 114 -  AST 15 - 41 U/L 40 49(H) -  ALT 0 - 44 U/L 16 18 -   CBC Latest Ref Rng & Units 02/18/2020 02/17/2020 02/16/2020  WBC 4.0 - 10.5 K/uL 11.5(H) 10.6(H) 7.2  Hemoglobin 13.0 - 17.0 g/dL 8.3(L) 9.0(L) 8.9(L)  Hematocrit 39.0 - 52.0 % 25.3(L) 26.8(L) 27.0(L)  Platelets 150 - 400 K/uL 145(L) 137(L) 165    RADS: CLINICAL DATA:  Patient with abdominal distension. Evaluate for obstruction.  EXAM: CT ABDOMEN AND PELVIS WITHOUT  CONTRAST  TECHNIQUE: Multidetector CT imaging of the abdomen and pelvis was performed following the standard protocol without IV contrast.  COMPARISON:  CT abdomen pelvis 02/26/2020.  FINDINGS: Lower chest: Heart is enlarged. Aortic vascular calcifications. Moderate left and small right pleural effusions. Patchy ground-glass and consolidative opacities within the right upper lobe. Dependent consolidative opacities within the lower lobes bilaterally.  Hepatobiliary: The liver is small and nodular in contour most compatible with cirrhosis. No intrahepatic or extrahepatic biliary ductal dilatation.  Pancreas: Unremarkable  Spleen: Unremarkable  Adrenals/Urinary Tract: Normal adrenal glands. Kidneys are symmetric in size. Two small stones inferior pole left kidney. Urinary bladder is decompressed with a Foley catheter.  Stomach/Bowel: There are prominent and distended loops of small bowel within the left hemiabdomen measuring up to 4.4 cm (image 50; series 3). There is gradual transition to more decompressed small bowel noted distally. Normal morphology of the stomach.  Vascular/Lymphatic: Normal caliber abdominal aorta. Peripheral calcified atherosclerotic plaque. No retroperitoneal lymphadenopathy. Multiple mesenteric varices.  Reproductive: Heterogeneous prostate.  Other: Small bilateral fat containing inguinal hernias. Soft tissue anasarca. Large volume ascites. No free intraperitoneal air.  Musculoskeletal: 2 degenerative changes.  No acute process.  IMPRESSION: 1. There are prominent and distended  loops of small bowel within the left hemiabdomen measuring up to 4.4 cm. There is gradual transition to more decompressed small bowel distally. Findings are nonspecific however may represent ileus or early/partial small bowel obstruction. 2. Findings compatible with cirrhosis and portal venous hypertension including large volume ascites. 3. Moderate left and  small right pleural effusions. 4. Patchy ground-glass and consolidative opacities within the right upper lobe and bilateral lower lobes which may represent infection or edema. Recommend follow-up chest CT in 4-6 weeks after resolution of acute symptomatology to ensure resolution. 5. Nonobstructing left nephrolithiasis. 6. Aortic atherosclerosis. Assessment:   Ileus versus partial small bowel obstruction Cirrhosis with ascites Hepatic encephalopathy  Plan:   NG tube placed at bedside by myself due to his recent episode of emesis, approximately 1 L of bilious discharge immediately noted upon placement.  Secured in place using tape.  Patient likely has an ileus type picture secondary to his cirrhosis causing persistent ascites and hepatic encephalopathy.  Recommend aggressive medical management for his liver issues while continuing NG tube decompression.  We will monitor for bowel movements as well as decreasing NG output to guide timing of NG removal and resuming diet.  Further care for medical comorbidities per primary team.

## 2020-02-18 NOTE — Progress Notes (Signed)
Patient stating that he is "going to die" all night long. Patient wanted to speak with his wife before going to bed. RN called and updated wife and patient spoke with his wife. Patient states that he "needs help" but he is not sure what he needs help with. Morphine given for 10/10 pain. Patient will report 12/10 pain. He is able to get a little rest after morphine is given. Repositioning does not seem to help with pain. He woke up in the middle of the night and monetarily forgot that he was at the hospital but was easily reoriented. He is oriented to person, place, and sometimes time. Patient had a few episodes of emesis this evening. Zofran given for nausea. Albumin given Q6 per orders. Lasix drip continued. Patient with little urine output despite being on lasix drip. Patient continues to have impending doom, and states that he just wants "to be knocked out." Call bell within reach. Patient able to make his needs known. Will continue with the current plan of care.

## 2020-02-18 NOTE — Progress Notes (Signed)
Spoke with Dr Juleen China, nephrology re PICC order.  States ok to place PICC.

## 2020-02-19 ENCOUNTER — Inpatient Hospital Stay: Payer: Medicare HMO

## 2020-02-19 DIAGNOSIS — K625 Hemorrhage of anus and rectum: Secondary | ICD-10-CM | POA: Diagnosis not present

## 2020-02-19 DIAGNOSIS — R188 Other ascites: Secondary | ICD-10-CM

## 2020-02-19 DIAGNOSIS — K7290 Hepatic failure, unspecified without coma: Secondary | ICD-10-CM | POA: Diagnosis not present

## 2020-02-19 LAB — CBC
HCT: 24.8 % — ABNORMAL LOW (ref 39.0–52.0)
HCT: 25.2 % — ABNORMAL LOW (ref 39.0–52.0)
Hemoglobin: 8.1 g/dL — ABNORMAL LOW (ref 13.0–17.0)
Hemoglobin: 8.6 g/dL — ABNORMAL LOW (ref 13.0–17.0)
MCH: 33.6 pg (ref 26.0–34.0)
MCH: 33.9 pg (ref 26.0–34.0)
MCHC: 32.7 g/dL (ref 30.0–36.0)
MCHC: 34.1 g/dL (ref 30.0–36.0)
MCV: 102.9 fL — ABNORMAL HIGH (ref 80.0–100.0)
MCV: 99.2 fL (ref 80.0–100.0)
Platelets: 123 10*3/uL — ABNORMAL LOW (ref 150–400)
Platelets: 117 10*3/uL — ABNORMAL LOW (ref 150–400)
RBC: 2.41 MIL/uL — ABNORMAL LOW (ref 4.22–5.81)
RBC: 2.54 MIL/uL — ABNORMAL LOW (ref 4.22–5.81)
RDW: 21.2 % — ABNORMAL HIGH (ref 11.5–15.5)
RDW: 21.2 % — ABNORMAL HIGH (ref 11.5–15.5)
WBC: 10.8 10*3/uL — ABNORMAL HIGH (ref 4.0–10.5)
WBC: 12.4 10*3/uL — ABNORMAL HIGH (ref 4.0–10.5)
nRBC: 0 % (ref 0.0–0.2)
nRBC: 0.2 % (ref 0.0–0.2)

## 2020-02-19 LAB — HEPATIC FUNCTION PANEL
ALT: 19 U/L (ref 0–44)
AST: 45 U/L — ABNORMAL HIGH (ref 15–41)
Albumin: 2.2 g/dL — ABNORMAL LOW (ref 3.5–5.0)
Alkaline Phosphatase: 92 U/L (ref 38–126)
Bilirubin, Direct: 0.7 mg/dL — ABNORMAL HIGH (ref 0.0–0.2)
Indirect Bilirubin: 1.3 mg/dL — ABNORMAL HIGH (ref 0.3–0.9)
Total Bilirubin: 2 mg/dL — ABNORMAL HIGH (ref 0.3–1.2)
Total Protein: 6 g/dL — ABNORMAL LOW (ref 6.5–8.1)

## 2020-02-19 LAB — AMMONIA: Ammonia: 72 umol/L — ABNORMAL HIGH (ref 9–35)

## 2020-02-19 LAB — BASIC METABOLIC PANEL
Anion gap: 14 (ref 5–15)
BUN: 36 mg/dL — ABNORMAL HIGH (ref 8–23)
CO2: 31 mmol/L (ref 22–32)
Calcium: 7.7 mg/dL — ABNORMAL LOW (ref 8.9–10.3)
Chloride: 95 mmol/L — ABNORMAL LOW (ref 98–111)
Creatinine, Ser: 2.86 mg/dL — ABNORMAL HIGH (ref 0.61–1.24)
GFR, Estimated: 23 mL/min — ABNORMAL LOW (ref >60)
Glucose, Bld: 136 mg/dL — ABNORMAL HIGH (ref 70–99)
Potassium: 3.5 mmol/L (ref 3.5–5.1)
Sodium: 140 mmol/L (ref 135–145)

## 2020-02-19 LAB — PHOSPHORUS: Phosphorus: 3.2 mg/dL (ref 2.5–4.6)

## 2020-02-19 LAB — MAGNESIUM: Magnesium: 1.9 mg/dL (ref 1.7–2.4)

## 2020-02-19 LAB — GLUCOSE, CAPILLARY
Glucose-Capillary: 118 mg/dL — ABNORMAL HIGH (ref 70–99)
Glucose-Capillary: 140 mg/dL — ABNORMAL HIGH (ref 70–99)
Glucose-Capillary: 124 mg/dL — ABNORMAL HIGH (ref 70–99)
Glucose-Capillary: 126 mg/dL — ABNORMAL HIGH (ref 70–99)

## 2020-02-19 LAB — PROTIME-INR
INR: 3.2 — ABNORMAL HIGH (ref 0.8–1.2)
Prothrombin Time: 31.9 seconds — ABNORMAL HIGH (ref 11.4–15.2)

## 2020-02-19 MED ORDER — LACTULOSE ENEMA
300.0000 mL | Freq: Two times a day (BID) | ORAL | Status: DC
  Administered 2020-02-19 (×2): 300 mL via RECTAL
  Filled 2020-02-19 (×7): qty 300

## 2020-02-19 NOTE — Progress Notes (Signed)
   02/19/20 2046  Assess: MEWS Score  Temp (!) 97.5 F (36.4 C)  BP (!) 135/50  Pulse Rate (!) 115  Resp 18  SpO2 100 %  O2 Device Nasal Cannula  O2 Flow Rate (L/min) 2 L/min  Assess: MEWS Score  MEWS Temp 0  MEWS Systolic 0  MEWS Pulse 2  MEWS RR 0  MEWS LOC 0  MEWS Score 2  MEWS Score Color Yellow  Assess: if the MEWS score is Yellow or Red  Were vital signs taken at a resting state? Yes  Focused Assessment No change from prior assessment  Early Detection of Sepsis Score *See Row Information* Medium  MEWS guidelines implemented *See Row Information* Yes  Treat  MEWS Interventions Escalated (See documentation below)  Pain Scale 0-10  Pain Score 0  Take Vital Signs  Increase Vital Sign Frequency  Yellow: Q 2hr X 2 then Q 4hr X 2, if remains yellow, continue Q 4hrs  Escalate  MEWS: Escalate Yellow: discuss with charge nurse/RN and consider discussing with provider and RRT  Notify: Charge Nurse/RN  Name of Charge Nurse/RN Notified Chief Operating Officer  Date Charge Nurse/RN Notified 02/19/20  Time Charge Nurse/RN Notified 2048  Document  Progress note created (see row info) Yes

## 2020-02-19 NOTE — Consult Note (Incomplete Revision)
Christian Berg , MD [address], [place], [place], [place], [address] [id number] [place], [place], [place], [address] Phone: [phone]  Fax: [phone]  Consultation  Referring Provider:     Dr Dwyane Dee Primary Care Physician:  Birdie Sons, MD Primary Gastroenterologist: Dr Haig Prophet         Reason for Consultation:     Rectal bleeding   Date of Admission:  02/26/2020 Date of Consultation:  02/19/2020         HPI:   Christian Berg is a 70 y.o. male has a prior history of liver cirrhosis secondary to [facility] diabetes mellitus morbid obesity.  Came into the hospital over the past few days with a change of mental status confusion.  Underwent a CT scan of the abdomen and pelvis that showed cirrhosis with a lot of verbal with large volume ascites.  Was also noted to be in atrial fibrillation with RVR.  Also found to be in acute kidney failure was noted to have partial small bowel obstruction, evaluated by surgery and treated with NG tube suction on 02/18/2018 to develop some rectal bleeding for which I have been consulted.  Hemoglobin 3 days back was 8.9 g and this morning was 8.1 g.  Platelet count 123.  Albumin of 2.2 INR of 3.2.[place] of the abdomen this morning shows possible ileus.  Creatinine has worsened since admission from 2.15 to present 2.86.  He was recently seen by Dr. Haig Prophet in 10/17/2019 and I did directed with with a GI bleed.  He had been on warfarin at that time.  EGD demonstrated bleeding ulcer in the second portion of the duodenum with visible vessel that was treated with clips and epinephrine.  No esophageal varices were noted.  I discussed with his nurse- blood noted after enemas was given earlier today , none after, no bloody output from NG tube.   Past Medical History:  Diagnosis Date  . A-fib ([place])   . Carotid stenosis   . CHF (congestive heart failure) ([place])   . Degenerative disc disease, lumbar    s/p injury  . Diabetes mellitus without complication  ([place])   . Disseminated superficial actinic porokeratosis   . Dysrhythmia    A-FIB, palpatations  . 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 Fatty liver   . History of degenerative disc disease   . History of kidney stones   . Hypercholesteremia   . Hypertension   . Kidney stones   . Obesity   . Pacemaker    inactive  . Peripheral vascular disease ([place])    Carotid stenosis  . Presence of permanent cardiac pacemaker    Inactive  . TIA (transient ischemic attack) 2011   No deficits  . TIA (transient ischemic attack)   . Vitamin B12 deficiency     Past Surgical History:  Procedure Laterality Date  . APPENDECTOMY  2004  . CARDIAC CATHETERIZATION    . CARDIAC PACEMAKER PLACEMENT  2005  . CATARACT EXTRACTION W/PHACO Right 06/14/2014   Procedure: CATARACT EXTRACTION PHACO AND INTRAOCULAR LENS PLACEMENT (IOC);  Surgeon: Leandrew Koyanagi, MD;  Location: [place];  Service: Ophthalmology;  Laterality: Right;  . CATARACT EXTRACTION W/PHACO Left 10/27/2018   Procedure: CATARACT EXTRACTION PHACO AND INTRAOCULAR LENS PLACEMENT (IOC) LEFT DIABETIC  00:46.4  18.6%  8.63;  Surgeon: Leandrew Koyanagi, MD;  Location: [place];  Service: Ophthalmology;  Laterality: Left;  Diabetic - oral meds  . CYSTOSCOPY W/ RETROGRADES Bilateral 08/09/2014   Procedure:  CYSTOSCOPY WITH RETROGRADE PYELOGRAM;  Surgeon: Hollice Espy, MD;  Location: [facility];  Service: Urology;  Laterality: Bilateral;  . ESOPHAGOGASTRODUODENOSCOPY (EGD) WITH PROPOFOL N/A 10/13/2019   Procedure: ESOPHAGOGASTRODUODENOSCOPY (EGD) WITH PROPOFOL;  Surgeon: Lesly Rubenstein, MD;  Location: [facility];  Service: Endoscopy;  Laterality: N/A;  . INTUBATION-ENDOTRACHEAL WITH TRACHEOSTOMY STANDBY  03/30/2017   Procedure: INTUBATION-ENDOTRACHEAL WITH TRACHEOSTOMY STANDBY;  Surgeon: Carloyn Manner, MD;  Location: [facility];  Service: ENT;;    Prior to Admission medications   Medication Sig Start Date End Date Taking?  Authorizing Provider  albuterol (VENTOLIN HFA) 108 (90 Base) MCG/ACT inhaler Inhale 2 puffs into the lungs every 6 (six) hours as needed for wheezing or shortness of breath. 01/30/20  Yes Birdie Sons, MD  lovastatin (MEVACOR) 20 MG tablet Take 1 tablet (20 mg total) by mouth every evening. 04/08/19  Yes Birdie Sons, MD  metFORMIN (GLUCOPHAGE) 1000 MG tablet Take 1 tablet by mouth twice daily Patient taking differently: Take 500 mg by mouth daily. 08/29/19  Yes Birdie Sons, MD  metoprolol tartrate (LOPRESSOR) 50 MG tablet Take 1 tablet (50 mg total) by mouth 2 (two) times daily. Patient taking differently: Take 100 mg by mouth daily. 10/23/19  Yes Ezekiel Slocumb, DO  Multiple Vitamin (MULTIVITAMIN WITH MINERALS) TABS tablet Take 1 tablet by mouth daily. 10/24/19  Yes Nicole Kindred A, DO  torsemide (DEMADEX) 10 MG tablet Take 1-2 tablets (10-20 mg total) by mouth daily. 01/04/20  Yes Birdie Sons, MD  traMADol-acetaminophen (ULTRACET) 37.5-325 MG tablet Take 1 tablet by mouth every 6 (six) hours as needed for severe pain. 08/06/19  Yes Wieting, Richard, MD  warfarin (COUMADIN) 5 MG tablet Take 5 mg by mouth 2 (two) times a week. 11/05/19  Yes [provider]    Family History  Adopted: Yes  Problem Relation Age of Onset  . Heart disease Mother   . Fibromyalgia Sister   . Hypertension Daughter   . Migraines Daughter   . Kidney disease Neg Hx   . Prostate cancer Neg Hx   . Bladder Cancer Neg Hx      Social History   Tobacco Use  . Smoking status: Never Smoker  . Smokeless tobacco: Never Used  Vaping Use  . Vaping Use: Never used  Substance Use Topics  . Alcohol use: No    Alcohol/week: 0.0 standard drinks  . Drug use: No    Allergies as of 02/14/2020 - Review Complete 02/14/2020  Allergen Reaction Noted  . Clindamycin/lincomycin  11/03/2016  . Sulfa antibiotics Hives 05/31/2014  . Sulfasalazine Hives 05/31/2014    Review of Systems:    Very drowsy and  somnolent-unable to give any history    Physical Exam:  Vital signs in last 24 hours: Temp:  [97.4 F (36.3 C)-98.1 F (36.7 C)] 98.1 F (36.7 C) (01/23 1212) Pulse Rate:  [77-118] 102 (01/23 1212) Resp:  [18-19] 18 (01/23 1212) BP: (93-126)/(36-76) 126/36 (01/23 1212) SpO2:  [94 %-100 %] 94 % (01/23 1212) Weight:  [121.2 kg] 121.2 kg (01/23 0424) Last BM Date: 02/16/20 General:   Drowsy  Head:  Normocephalic and atraumatic. Eyes:   No icterus.   Conjunctiva pink. PERRLA. Ears:  Unable to assess  Neck:  Supple; no masses or thyroidomegaly Lungs: Respirations even and unlabored. Lungs clear to auscultation bilaterally.   No wheezes, crackles, or rhonchi.  Heart:  Regular rate and rhythm;  Without murmur, clicks, rubs or gallops Abdomen:  Soft, mild distension ,  nontender. Normal bowel sounds. No appreciable masses or hepatomegaly.  No rebound or guarding.  Neurologic:  Alert and oriented x o . Skin:  Intact without significant lesions or rashes. Cervical Nodes:  No significant cervical adenopathy. Psych:  Unable to asess  LAB RESULTS: Recent Labs    02/17/20 0326 02/18/20 0620 02/19/20 0501  WBC 10.6* 11.5* 10.8*  HGB 9.0* 8.3* 8.1*  HCT 26.8* 25.3* 24.8*  PLT 137* 145* 123*   BMET Recent Labs    02/17/20 0326 02/18/20 0620 02/19/20 0501  NA 140 140 140  K 3.2* 3.4* 3.5  CL 95* 95* 95*  CO2 29 30 31   GLUCOSE 107* 134* 136*  BUN 28* 32* 36*  CREATININE 2.31* 2.45* 2.86*  CALCIUM 7.9* 8.0* 7.7*   LFT Recent Labs    02/19/20 0501  PROT 6.0*  ALBUMIN 2.2*  AST 45*  ALT 19  ALKPHOS 92  BILITOT 2.0*  BILIDIR 0.7*  IBILI 1.3*   PT/INR Recent Labs    02/18/20 0620 02/19/20 0501  LABPROT 32.9* 31.9*  INR 3.4* 3.2*    STUDIES: CT ABDOMEN PELVIS WO CONTRAST  Result Date: 02/18/2020 CLINICAL DATA:  Patient with abdominal distension. Evaluate for obstruction. EXAM: CT ABDOMEN AND PELVIS WITHOUT CONTRAST TECHNIQUE: Multidetector CT imaging of the abdomen  and pelvis was performed following the standard protocol without IV contrast. COMPARISON:  CT abdomen pelvis 02/22/2020. FINDINGS: Lower chest: Heart is enlarged. Aortic vascular calcifications. Moderate left and small right pleural effusions. Patchy ground-glass and consolidative opacities within the right upper lobe. Dependent consolidative opacities within the lower lobes bilaterally. Hepatobiliary: The liver is small and nodular in contour most compatible with cirrhosis. No intrahepatic or extrahepatic biliary ductal dilatation. Pancreas: Unremarkable Spleen: Unremarkable Adrenals/Urinary Tract: Normal adrenal glands. Kidneys are symmetric in size. Two small stones inferior pole left kidney. Urinary bladder is decompressed with a Foley catheter. Stomach/Bowel: There are prominent and distended loops of small bowel within the left hemiabdomen measuring up to 4.4 cm (image 50; series 3). There is gradual transition to more decompressed small bowel noted distally. Normal morphology of the stomach. Vascular/Lymphatic: Normal caliber abdominal aorta. Peripheral calcified atherosclerotic plaque. No retroperitoneal lymphadenopathy. Multiple mesenteric varices. Reproductive: Heterogeneous prostate. Other: Small bilateral fat containing inguinal hernias. Soft tissue anasarca. Large volume ascites. No free intraperitoneal air. Musculoskeletal: 2 degenerative changes.  No acute process. IMPRESSION: 1. There are prominent and distended loops of small bowel within the left hemiabdomen measuring up to 4.4 cm. There is gradual transition to more decompressed small bowel distally. Findings are nonspecific however may represent ileus or early/partial small bowel obstruction. 2. Findings compatible with cirrhosis and portal venous hypertension including large volume ascites. 3. Moderate left and small right pleural effusions. 4. Patchy ground-glass and consolidative opacities within the right upper lobe and bilateral lower lobes  which may represent infection or edema. Recommend follow-up chest CT in 4-6 weeks after resolution of acute symptomatology to ensure resolution. 5. Nonobstructing left nephrolithiasis. 6. Aortic atherosclerosis. Electronically Signed   By: Lovey Newcomer M.D.   On: 02/18/2020 12:28   DG Abd 1 View  Result Date: 02/19/2020 CLINICAL DATA:  In patient encounter for small bowel obstruction EXAM: ABDOMEN - 1 VIEW COMPARISON:  Abdomen and pelvis CT from yesterday FINDINGS: Stable degree of small and large bowel gas with small bowel loops measuring up to 4.4 cm. There is an enteric tube with tip at the stomach and side port near the GE junction. IMPRESSION: 1. Unchanged degree of gaseous  bowel distension. No change in the pattern to suggest evolving small-bowel obstruction, favor ileus. 2. Enteric tube with tip over the stomach and side port near the GE junction. Electronically Signed   By: Monte Fantasia M.D.   On: 02/19/2020 10:04   DG Chest Port 1 View  Result Date: 02/18/2020 CLINICAL DATA:  NG tube placement. EXAM: PORTABLE CHEST 1 VIEW COMPARISON:  02/18/2020 FINDINGS: An enteric tube is present with tip in the left upper quadrant consistent with location in the proximal stomach. The proximal side hole projects at or just below the level of the EG junction. Right PICC line with tip over the low SVC region. Cardiac pacemaker. Cardiac enlargement with mild central vascular congestion. Hazy perihilar appearance may represent edema or pneumonia. No definite pleural effusion. No pneumothorax. IMPRESSION: Enteric tube tip is in the left upper quadrant consistent with location in the proximal stomach. Cardiac enlargement with pulmonary vascular congestion and perihilar edema/infiltrates. Electronically Signed   By: Lucienne Capers M.D.   On: 02/18/2020 20:06   DG Chest Port 1 View  Result Date: 02/18/2020 CLINICAL DATA:  70 year old male status post central line placement. EXAM: PORTABLE CHEST 1 VIEW COMPARISON:   Chest radiograph dated 02/14/2020. FINDINGS: Interval placement of a right-sided PICC with tip over central SVC close to the cavoatrial junction. Enteric tube extends below the diaphragm with tip beyond the inferior margin of the image. Shallow inspiration. There is cardiomegaly with vascular congestion and mild edema. No large pleural effusion or pneumothorax. Atherosclerotic calcification of the aorta. Left pectoral pacemaker device. IMPRESSION: Interval placement of a right-sided PICC with tip over central SVC. No pneumothorax. Electronically Signed   By: Anner Crete M.D.   On: 02/18/2020 19:13   [place] EKG SITE RITE  Result Date: 02/18/2020 If [facility] image not attached, placement could not be confirmed due to current cardiac rhythm.     Impression / Plan:   RENLY ROOTS is a 70 y.o. y/o male with a history of Karlene Lineman cirrhosis admitted with initially features of partial small bowel obstruction treated conservatively with an NG tube presently x-ray shows possible ileus.No blood mentioned from NG tube.  Overnight he had an episode of rectal bleeding for which have been consulted.  No significant drop in hemoglobin from baseline.Likely secondary to trauma from enema and being on Lovenox at the same time   Last EGD was 3 months back by Dr. Haig Prophet which showed no esophageal varices but demonstrated a bleeding duodenal ulcer.  At that point of time he was on warfarin.  Presently he has developed acute kidney injury.  Unclear if this is prerenal or hepatorenal.His INR is 3.2 which could indicate acute liver failure or effects of Lovenox.No evidence of SBP on 02/16/2020  Plan 1.  Rectal bleeding likely secondary to trauma due to enema,  .  At this point of time cannot obtain a colonoscopy as he has small bowel ileus and cannot tolerate a bowel prep.  There is no evidence of a variceal bleed as the NG tube has not aspirated any blood.  2.  Hepatic encephalopathy lactulose enema, Xifaxan when he can  take orally.Encephelopathy likely worse due to AKI secondary to small bowel ileus.   Nephrology is following for his acute kidney injury.  If they feel it is hepatorenal syndrome would suggest adding octreotide and midodrine in addition.IF no improvement in mental status consider CT head.  3.  If There is no contraindication consider reversing elevated INR with vitamin K 10  mg subcutaneous daily for a total of 3 doses 1 dose daily.  Thank you for involving me in the care of this patient.      LOS: 4 days   Christian Bellows, MD  02/19/2020, 2:03 PM

## 2020-02-19 NOTE — Progress Notes (Signed)
[facility]  ROUNDING NOTE   Subjective:   Confused this morning.  NGT placed yesteray.  UOP not recorded. Has foley catheter.  Furosemide gtt 79m/hr.   Rectal bleeding this morning.   Objective:  Vital signs in last 24 hours:  Temp:  [97.4 F (36.3 C)-98.1 F (36.7 C)] 98.1 F (36.7 C) (01/23 1212) Pulse Rate:  [77-118] 102 (01/23 1212) Resp:  [18-19] 18 (01/23 1212) BP: (93-126)/(36-76) 126/36 (01/23 1212) SpO2:  [94 %-100 %] 94 % (01/23 1212) Weight:  [121.2 kg] 121.2 kg (01/23 0424)  Weight change:  Filed Weights   02/14/20 2210 02/17/20 0412 02/19/20 0424  Weight: 131 kg 122.2 kg 121.2 kg    Intake/Output: I/O last 3 completed shifts: In: 221.2 [I.V.:32.4; IV Piggyback:188.8] Out: 1150 [Urine:150; Emesis/NG output:1000]   Intake/Output this shift:  No intake/output data recorded.  Physical Exam: General:  No acute distress, laying in bed.   Head:  Normocephalic, atraumatic. Moist oral mucosal membranes  Eyes:  Anicteric  Neck:  Supple  Lungs:   Clear to auscultation, normal effort  Heart:  regular  Abdomen:   +distended  Extremities:  2+ peripheral edema.  Neurologic:  alert to self only  Skin:  Warm/dry  Access:  No hemodialysis access    Basic Metabolic Panel: Recent Labs  Lab 02/14/20 2214 02/17/2020 0048 02/16/20 0404 02/16/20 1049 02/17/20 0326 02/18/20 0620 02/19/20 0501  NA 141  --  142  --  140 140 140  K 2.9*  --  2.9*  --  3.2* 3.4* 3.5  CL 94*  --  98  --  95* 95* 95*  CO2 31  --  32  --  29 30 31   GLUCOSE 103*  --  99  --  107* 134* 136*  BUN 26*  --  26*  --  28* 32* 36*  CREATININE 2.15*  --  2.13*  --  2.31* 2.45* 2.86*  CALCIUM 8.4*  --  7.8*  --  7.9* 8.0* 7.7*  MG  --  2.0  --   --  1.8 2.0 1.9  PHOS  --   --   --  3.2 3.1 2.9 3.2    Liver Function Tests: Recent Labs  Lab 02/14/20 2214 02/17/20 0326 02/18/20 0620 02/19/20 0501  AST 52* 49* 40 45*  ALT 18 18 16 19   ALKPHOS 123 114 96 92  BILITOT 2.0*  1.8* 1.8* 2.0*  PROT 7.2 6.2* 6.4* 6.0*  ALBUMIN 2.1* 1.8* 2.6* 2.2*   No results for input(s): LIPASE, AMYLASE in the last 168 hours. Recent Labs  Lab 02/17/20 0326 02/18/20 0620 02/19/20 0500  AMMONIA 34 48* 72*    CBC: Recent Labs  Lab 02/14/20 2214 02/16/20 0404 02/17/20 0326 02/18/20 0620 02/19/20 0501  WBC 6.5 7.2 10.6* 11.5* 10.8*  HGB 9.9* 8.9* 9.0* 8.3* 8.1*  HCT 30.1* 27.0* 26.8* 25.3* 24.8*  MCV 103.1* 100.7* 101.5* 102.0* 102.9*  PLT 188 165 137* 145* 123*    Cardiac Enzymes: No results for input(s): CKTOTAL, CKMB, CKMBINDEX, TROPONINI in the last 168 hours.  BNP: Invalid input(s): POCBNP  CBG: Recent Labs  Lab 02/18/20 1302 02/18/20 1708 02/18/20 2118 02/19/20 0813 02/19/20 1215  GLUCAP 129* 124* 134* 118* 140*    Microbiology: Results for orders placed or performed during the hospital encounter of 02/22/2020  Resp Panel by RT-PCR (Flu A&B, Covid) Nasopharyngeal Swab     Status: None   Collection Time: 02/11/2020  7:56 AM   Specimen:  Nasopharyngeal Swab; Nasopharyngeal(NP) swabs in vial transport medium  Result Value Ref Range Status   SARS Coronavirus 2 by RT PCR NEGATIVE NEGATIVE Final    Comment: (NOTE) SARS-CoV-2 target nucleic acids are NOT DETECTED.  The SARS-CoV-2 RNA is generally detectable in upper respiratory specimens during the acute phase of infection. The lowest concentration of SARS-CoV-2 viral copies this assay can detect is 138 copies/mL. A negative result does not preclude SARS-Cov-2 infection and should not be used as the sole basis for treatment or other patient management decisions. A negative result may occur with  improper specimen collection/handling, submission of specimen other than nasopharyngeal swab, presence of viral mutation(s) within the areas targeted by this assay, and inadequate number of viral copies(<138 copies/mL). A negative result must be combined with clinical observations, patient history, and  epidemiological information. The expected result is Negative.  Fact Sheet for Patients:  EntrepreneurPulse.com.au  Fact Sheet for Healthcare Providers:  IncredibleEmployment.be  This test is no t yet approved or cleared by the [place] FDA and  has been authorized for detection and/or diagnosis of SARS-CoV-2 by FDA under an Emergency Use Authorization (EUA). This EUA will remain  in effect (meaning this test can be used) for the duration of the COVID-19 declaration under Section 564(b)(1) of the Act, 21 U.S.C.section 360bbb-3(b)(1), unless the authorization is terminated  or revoked sooner.       Influenza A by PCR NEGATIVE NEGATIVE Final   Influenza B by PCR NEGATIVE NEGATIVE Final    Comment: (NOTE) The Xpert Xpress SARS-CoV-2/FLU/RSV plus assay is intended as an aid in the diagnosis of influenza from Nasopharyngeal swab specimens and should not be used as a sole basis for treatment. Nasal washings and aspirates are unacceptable for Xpert Xpress SARS-CoV-2/FLU/RSV testing.  Fact Sheet for Patients: EntrepreneurPulse.com.au  Fact Sheet for Healthcare Providers: IncredibleEmployment.be  This test is not yet approved or cleared by the [place] FDA and has been authorized for detection and/or diagnosis of SARS-CoV-2 by FDA under an Emergency Use Authorization (EUA). This EUA will remain in effect (meaning this test can be used) for the duration of the COVID-19 declaration under Section 564(b)(1) of the Act, 21 U.S.C. section 360bbb-3(b)(1), unless the authorization is terminated or revoked.  Performed at [facility], [address], [place], [place] [address]   Body fluid culture     Status: None   Collection Time: 01/29/2020  9:05 AM   Specimen: Peritoneal Washings; Body Fluid  Result Value Ref Range Status   Specimen Description   Final    PERITONEAL Performed at [facility], [address] [place], [place] [address]    Special Requests   Final    NONE Performed at [facility], [address] [place], [place] [address]    Gram Stain   Final    FEW WBC PRESENT, PREDOMINANTLY MONONUCLEAR NO ORGANISMS SEEN    Culture   Final    NO GROWTH Performed at [facility], [place] [address], [place], [place] [address]    Report Status 02/18/2020 FINAL  Final  Body fluid culture     Status: None (Preliminary result)   Collection Time: 02/16/20  3:35 PM   Specimen: PATH Cytology Peritoneal fluid  Result Value Ref Range Status   Specimen Description   Final    PERITONEAL Performed at [facility], [address], [place], [place] [address]    Special Requests   Final    NONE Performed at  [place], [place] [address]    Gram Stain   Final    RARE WBC PRESENT, PREDOMINANTLY MONONUCLEAR NO ORGANISMS SEEN    Culture   Final    NO GROWTH 2 DAYS Performed at [place] [address] [place],  [address]    Report Status PENDING  Incomplete    Coagulation Studies: Recent Labs    02/17/20 0326 02/18/20 0620 02/19/20 0501  LABPROT 31.7* 32.9* 31.9*  INR 3.2* 3.4* 3.2*    Urinalysis: No results for input(s): COLORURINE, LABSPEC, PHURINE, GLUCOSEU, HGBUR, BILIRUBINUR, KETONESUR, PROTEINUR, UROBILINOGEN, NITRITE, LEUKOCYTESUR in the last 72 hours.  Invalid input(s): APPERANCEUR    Imaging: CT ABDOMEN PELVIS WO CONTRAST  Result Date: 02/18/2020 CLINICAL DATA:  Patient with abdominal distension. Evaluate for obstruction. EXAM: CT ABDOMEN AND PELVIS WITHOUT CONTRAST TECHNIQUE: Multidetector CT imaging of the abdomen and pelvis was performed following the standard protocol without IV contrast. COMPARISON:  CT abdomen pelvis 02/05/2020. FINDINGS: Lower chest: Heart is enlarged. Aortic vascular calcifications. Moderate left and small right pleural effusions. Patchy  ground-glass and consolidative opacities within the right upper lobe. Dependent consolidative opacities within the lower lobes bilaterally. Hepatobiliary: The liver is small and nodular in contour most compatible with cirrhosis. No intrahepatic or extrahepatic biliary ductal dilatation. Pancreas: Unremarkable Spleen: Unremarkable Adrenals/Urinary Tract: Normal adrenal glands. Kidneys are symmetric in size. Two small stones inferior pole left kidney. Urinary bladder is decompressed with a Foley catheter. Stomach/Bowel: There are prominent and distended loops of small bowel within the left hemiabdomen measuring up to 4.4 cm (image 50; series 3). There is gradual transition to more decompressed small bowel noted distally. Normal morphology of the stomach. Vascular/Lymphatic: Normal caliber abdominal aorta. Peripheral calcified atherosclerotic plaque. No retroperitoneal lymphadenopathy. Multiple mesenteric varices. Reproductive: Heterogeneous prostate. Other: Small bilateral fat containing inguinal hernias. Soft tissue anasarca. Large volume ascites. No free intraperitoneal air. Musculoskeletal: 2 degenerative changes.  No acute process. IMPRESSION: 1. There are prominent and distended loops of small bowel within the left hemiabdomen measuring up to 4.4 cm. There is gradual transition to more decompressed small bowel distally. Findings are nonspecific however may represent ileus or early/partial small bowel obstruction. 2. Findings compatible with cirrhosis and portal venous hypertension including large volume ascites. 3. Moderate left and small right pleural effusions. 4. Patchy ground-glass and consolidative opacities within the right upper lobe and bilateral lower lobes which may represent infection or edema. Recommend follow-up chest CT in 4-6 weeks after resolution of acute symptomatology to ensure resolution. 5. Nonobstructing left nephrolithiasis. 6. Aortic atherosclerosis. Electronically Signed   By: Lovey Newcomer  M.D.   On: 02/18/2020 12:28   DG Abd 1 View  Result Date: 02/19/2020 CLINICAL DATA:  In patient encounter for small bowel obstruction EXAM: ABDOMEN - 1 VIEW COMPARISON:  Abdomen and pelvis CT from yesterday FINDINGS: Stable degree of small and large bowel gas with small bowel loops measuring up to 4.4 cm. There is an enteric tube with tip at the stomach and side port near the GE junction. IMPRESSION: 1. Unchanged degree of gaseous bowel distension. No change in the pattern to suggest evolving small-bowel obstruction, favor ileus. 2. Enteric tube with tip over the stomach and side port near the GE junction. Electronically Signed   By: Monte Fantasia M.D.   On: 02/19/2020 10:04   DG Chest Port 1 View  Result Date: 02/18/2020 CLINICAL DATA:  NG tube placement. EXAM: PORTABLE CHEST 1 VIEW COMPARISON:  02/18/2020 FINDINGS: An enteric tube is present with tip in the left upper quadrant consistent with location in the proximal stomach. The proximal side hole projects at or just below the level of the EG junction. Right PICC line with tip over the low SVC region. Cardiac pacemaker. Cardiac enlargement with mild central vascular congestion. Hazy perihilar appearance may represent edema or pneumonia. No definite pleural effusion. No pneumothorax. IMPRESSION: Enteric tube tip is in the left upper quadrant consistent with location in the proximal stomach. Cardiac enlargement with pulmonary vascular congestion and perihilar edema/infiltrates. Electronically Signed   By: Lucienne Capers M.D.   On: 02/18/2020 20:06   DG Chest Port 1 View  Result Date: 02/18/2020 CLINICAL DATA:  70 year old male status post central line placement. EXAM: PORTABLE CHEST 1 VIEW COMPARISON:  Chest radiograph dated 02/14/2020. FINDINGS: Interval placement of a right-sided PICC with tip over central SVC close to the cavoatrial junction. Enteric tube extends below the diaphragm with tip beyond the inferior margin of the image. Shallow  inspiration. There is cardiomegaly with vascular congestion and mild edema. No large pleural effusion or pneumothorax. Atherosclerotic calcification of the aorta. Left pectoral pacemaker device. IMPRESSION: Interval placement of a right-sided PICC with tip over central SVC. No pneumothorax. Electronically Signed   By: Anner Crete M.D.   On: 02/18/2020 19:13   [place] EKG SITE RITE  Result Date: 02/18/2020 If [facility] image not attached, placement could not be confirmed due to current cardiac rhythm.    Medications:   . sodium chloride    . cefTRIAXone (ROCEPHIN)  IV 2 g (02/19/20 1002)  . furosemide (LASIX) 200 mg in dextrose 5% 100 mL (82m/mL) infusion 5 mg/hr (02/18/20 1822)   . Chlorhexidine Gluconate Cloth  6 each Topical Daily  . diltiazem  30 mg Oral Q8H  . enoxaparin (LOVENOX) injection  0.5 mg/kg Subcutaneous Q24H  . feeding supplement (NEPRO CARB STEADY)  237 mL Oral TID BM  . ferrous sulfate  325 mg Oral Q breakfast  . Gerhardt's butt cream   Topical BID  . hydrocerin  1 application Topical Daily  . insulin aspart  0-20 Units Subcutaneous TID WC  . lactulose  300 mL Rectal BID  . multivitamin with minerals  1 tablet Oral Daily  . pantoprazole (PROTONIX) IV  40 mg Intravenous Q12H  . pravastatin  20 mg Oral q1800  . sodium chloride flush  10-40 mL Intracatheter Q12H  . sodium chloride flush  3 mL Intravenous Q12H   sodium chloride, albuterol, diltiazem, ketoconazole, midodrine, morphine injection, ondansetron **OR** ondansetron (ZOFRAN) IV, oxyCODONE, phenol, simethicone, sodium chloride flush, sodium chloride flush, traZODone  Assessment/ Plan:   Mr. Christian CRISAFULLIis a 70y.o. white male with diabetes mellitus type 2, hypertension, BPH, atrial fibrillation, hepatic cirrhosis, atrial fibrillation, congestive heart failure, peripheral vascular disease, TIA, pacemaker [facility] ([place] [K72.90] Confusion [R41.0] Other  ascites [R18.8] AMS (altered mental status) [R41.82]  1.  Acute kidney injury on chronic kidney disease stage IIIb baseline creatinine 1.6, EGFR of 42 on 01/30/2020 History of hematuria. No contrast exposure.  Acute kidney injury secondary to acute hepatorenal syndrome, prerenal azotemia from small bowel obstruction.  Responding diuretics and IV albumin - Continue IV lasix gtt for one more day. Evaluate volume status tomorrow.   2.  Anemia of chronic kidney disease.  Hemoglobin 8.1, macrocytic. With thrombocytopenia.  Has not gotten ESA this admission.   3.  Hepatic cirrhosis with  anasarca: status post IV albumin. Large volume paracentesis on 1/20 with 9.9 liters removed.  - check hepatitis viral screen.    LOS: 4 Enola Siebers 1/23/20222:12 PM

## 2020-02-19 NOTE — Progress Notes (Signed)
Subjective:  CC: Christian Berg is a 69 y.o. male  Hospital stay day 4,   pSBO  HPI: No acute issues overnight. No reported BM or flatus  ROS:  Unable to obtain due to patient mentation  Objective:   Temp:  [97.4 F (36.3 C)-98.1 F (36.7 C)] 97.5 F (36.4 C) (01/23 2046) Pulse Rate:  [100-115] 115 (01/23 2046) Resp:  [18-19] 18 (01/23 2046) BP: (93-135)/(36-76) 135/50 (01/23 2046) SpO2:  [94 %-100 %] 100 % (01/23 2046) Weight:  [121.2 kg] 121.2 kg (01/23 0424)     Height: 5' 8"  (172.7 cm) Weight: 121.2 kg BMI (Calculated): 40.64   Intake/Output this shift:   Intake/Output Summary (Last 24 hours) at 02/19/2020 2049 Last data filed at 02/19/2020 1908 Gross per 24 hour  Intake 100 ml  Output 900 ml  Net -800 ml    Constitutional :  uncooperative and unable to answer questions  Respiratory:  clear to auscultation bilaterally  Cardiovascular:  regular rate and rhythm  Gastrointestinal: soft, non-tender; bowel sounds normal; no masses,  no organomegaly. NG with dark bilious output  Skin: Cool and moist.   Psychiatric: Normal affect, non-agitated, not confused       LABS:  CMP Latest Ref Rng & Units 02/19/2020 02/18/2020 02/17/2020  Glucose 70 - 99 mg/dL 136(H) 134(H) 107(H)  BUN 8 - 23 mg/dL 36(H) 32(H) 28(H)  Creatinine 0.61 - 1.24 mg/dL 2.86(H) 2.45(H) 2.31(H)  Sodium 135 - 145 mmol/L 140 140 140  Potassium 3.5 - 5.1 mmol/L 3.5 3.4(L) 3.2(L)  Chloride 98 - 111 mmol/L 95(L) 95(L) 95(L)  CO2 22 - 32 mmol/L 31 30 29   Calcium 8.9 - 10.3 mg/dL 7.7(L) 8.0(L) 7.9(L)  Total Protein 6.5 - 8.1 g/dL 6.0(L) 6.4(L) 6.2(L)  Total Bilirubin 0.3 - 1.2 mg/dL 2.0(H) 1.8(H) 1.8(H)  Alkaline Phos 38 - 126 U/L 92 96 114  AST 15 - 41 U/L 45(H) 40 49(H)  ALT 0 - 44 U/L 19 16 18    CBC Latest Ref Rng & Units 02/19/2020 02/19/2020 02/18/2020  WBC 4.0 - 10.5 K/uL 12.4(H) 10.8(H) 11.5(H)  Hemoglobin 13.0 - 17.0 g/dL 8.6(L) 8.1(L) 8.3(L)  Hematocrit 39.0 - 52.0 % 25.2(L) 24.8(L) 25.3(L)   Platelets 150 - 400 K/uL 117(L) 123(L) 145(L)    RADS: CLINICAL DATA:  In patient encounter for small bowel obstruction  EXAM: ABDOMEN - 1 VIEW  COMPARISON:  Abdomen and pelvis CT from yesterday  FINDINGS: Stable degree of small and large bowel gas with small bowel loops measuring up to 4.4 cm. There is an enteric tube with tip at the stomach and side port near the GE junction.  IMPRESSION: 1. Unchanged degree of gaseous bowel distension. No change in the pattern to suggest evolving small-bowel obstruction, favor ileus. 2. Enteric tube with tip over the stomach and side port near the GE junction.   Electronically Signed   By: Monte Fantasia M.D.   On: 02/19/2020 10:04 Assessment:   Patient likely has an ileus type picture secondary to his cirrhosis causing persistent ascites and hepatic encephalopathy.  Continue aggressive medical management for his liver issues while continuing NG tube decompression.  We will monitor for bowel movements as well as decreasing NG output to guide timing of NG removal and resuming diet.

## 2020-02-19 NOTE — Consult Note (Addendum)
Christian Berg , MD [address] [place], [place], [place], [address] [address] [place], [place], [place], [address] Phone: [phone]  Fax: [phone]  Consultation  Referring Provider:     Dr Dwyane Dee Primary Care Physician:  Birdie Sons, MD Primary Gastroenterologist: Dr Haig Prophet         Reason for Consultation:     Rectal bleeding   Date of Admission:  02/16/2020 Date of Consultation:  02/19/2020         HPI:   Christian Berg is a 70 y.o. male has a prior history of liver cirrhosis secondary to [facility] diabetes mellitus morbid obesity.  Came into the hospital over the past few days with a change of mental status confusion.  Underwent a CT scan of the abdomen and pelvis that showed cirrhosis with a lot of verbal with large volume ascites.  Was also noted to be in atrial fibrillation with RVR.  Also found to be in acute kidney failure was noted to have partial small bowel obstruction, evaluated by surgery and treated with NG tube suction on 02/18/2018 to develop some rectal bleeding for which I have been consulted.  Hemoglobin 3 days back was 8.9 g and this morning was 8.1 g.  Platelet count 123.  Albumin of 2.2 INR of 3.2.[place] of the abdomen this morning shows possible ileus.  Creatinine has worsened since admission from 2.15 to present 2.86.  He was recently seen by Dr. Haig Prophet in 10/17/2019 and I did directed with with a GI bleed.  He had been on warfarin at that time.  EGD demonstrated bleeding ulcer in the second portion of the duodenum with visible vessel that was treated with clips and epinephrine.  No esophageal varices were noted.  I discussed with his nurse- blood noted after enemas was given earlier today , none after, no bloody output from NG tube.   Past Medical History:  Diagnosis Date  . A-fib ([place])   . Carotid stenosis   . CHF (congestive heart failure) ([place])   . Degenerative disc disease, lumbar    s/p injury  . Diabetes mellitus without complication  ([place])   . Disseminated superficial actinic porokeratosis   . Dysrhythmia    A-FIB, palpatations  . 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 Fatty liver   . History of degenerative disc disease   . History of kidney stones   . Hypercholesteremia   . Hypertension   . Kidney stones   . Obesity   . Pacemaker    inactive  . Peripheral vascular disease ([place])    Carotid stenosis  . Presence of permanent cardiac pacemaker    Inactive  . TIA (transient ischemic attack) 2011   No deficits  . TIA (transient ischemic attack)   . Vitamin B12 deficiency     Past Surgical History:  Procedure Laterality Date  . APPENDECTOMY  2004  . CARDIAC CATHETERIZATION    . CARDIAC PACEMAKER PLACEMENT  2005  . CATARACT EXTRACTION W/PHACO Right 06/14/2014   Procedure: CATARACT EXTRACTION PHACO AND INTRAOCULAR LENS PLACEMENT (IOC);  Surgeon: Leandrew Koyanagi, MD;  Location: [facility];  Service: Ophthalmology;  Laterality: Right;  . CATARACT EXTRACTION W/PHACO Left 10/27/2018   Procedure: CATARACT EXTRACTION PHACO AND INTRAOCULAR LENS PLACEMENT (IOC) LEFT DIABETIC  00:46.4  18.6%  8.63;  Surgeon: Leandrew Koyanagi, MD;  Location: [facility];  Service: Ophthalmology;  Laterality: Left;  Diabetic - oral meds  . CYSTOSCOPY W/ RETROGRADES Bilateral 08/09/2014   Procedure:  CYSTOSCOPY WITH RETROGRADE PYELOGRAM;  Surgeon: Hollice Espy, MD;  Location: [facility];  Service: Urology;  Laterality: Bilateral;  . ESOPHAGOGASTRODUODENOSCOPY (EGD) WITH PROPOFOL N/A 10/13/2019   Procedure: ESOPHAGOGASTRODUODENOSCOPY (EGD) WITH PROPOFOL;  Surgeon: Lesly Rubenstein, MD;  Location: [facility];  Service: Endoscopy;  Laterality: N/A;  . INTUBATION-ENDOTRACHEAL WITH TRACHEOSTOMY STANDBY  03/30/2017   Procedure: INTUBATION-ENDOTRACHEAL WITH TRACHEOSTOMY STANDBY;  Surgeon: Carloyn Manner, MD;  Location: [facility];  Service: ENT;;    Prior to Admission medications   Medication Sig Start Date End Date Taking?  Authorizing Provider  albuterol (VENTOLIN HFA) 108 (90 Base) MCG/ACT inhaler Inhale 2 puffs into the lungs every 6 (six) hours as needed for wheezing or shortness of breath. 01/30/20  Yes Birdie Sons, MD  lovastatin (MEVACOR) 20 MG tablet Take 1 tablet (20 mg total) by mouth every evening. 04/08/19  Yes Birdie Sons, MD  metFORMIN (GLUCOPHAGE) 1000 MG tablet Take 1 tablet by mouth twice daily Patient taking differently: Take 500 mg by mouth daily. 08/29/19  Yes Birdie Sons, MD  metoprolol tartrate (LOPRESSOR) 50 MG tablet Take 1 tablet (50 mg total) by mouth 2 (two) times daily. Patient taking differently: Take 100 mg by mouth daily. 10/23/19  Yes Ezekiel Slocumb, DO  Multiple Vitamin (MULTIVITAMIN WITH MINERALS) TABS tablet Take 1 tablet by mouth daily. 10/24/19  Yes Nicole Kindred A, DO  torsemide (DEMADEX) 10 MG tablet Take 1-2 tablets (10-20 mg total) by mouth daily. 01/04/20  Yes Birdie Sons, MD  traMADol-acetaminophen (ULTRACET) 37.5-325 MG tablet Take 1 tablet by mouth every 6 (six) hours as needed for severe pain. 08/06/19  Yes Wieting, Richard, MD  warfarin (COUMADIN) 5 MG tablet Take 5 mg by mouth 2 (two) times a week. 11/05/19  Yes [provider]    Family History  Adopted: Yes  Problem Relation Age of Onset  . Heart disease Mother   . Fibromyalgia Sister   . Hypertension Daughter   . Migraines Daughter   . Kidney disease Neg Hx   . Prostate cancer Neg Hx   . Bladder Cancer Neg Hx      Social History   Tobacco Use  . Smoking status: Never Smoker  . Smokeless tobacco: Never Used  Vaping Use  . Vaping Use: Never used  Substance Use Topics  . Alcohol use: No    Alcohol/week: 0.0 standard drinks  . Drug use: No    Allergies as of 02/14/2020 - Review Complete 02/14/2020  Allergen Reaction Noted  . Clindamycin/lincomycin  11/03/2016  . Sulfa antibiotics Hives 05/31/2014  . Sulfasalazine Hives 05/31/2014    Review of Systems:    Very drowsy and  somnolent-unable to give any history    Physical Exam:  Vital signs in last 24 hours: Temp:  [97.4 F (36.3 C)-98.1 F (36.7 C)] 98.1 F (36.7 C) (01/23 1212) Pulse Rate:  [77-118] 102 (01/23 1212) Resp:  [18-19] 18 (01/23 1212) BP: (93-126)/(36-76) 126/36 (01/23 1212) SpO2:  [94 %-100 %] 94 % (01/23 1212) Weight:  [121.2 kg] 121.2 kg (01/23 0424) Last BM Date: 02/16/20 General:   Drowsy  Head:  Normocephalic and atraumatic. Eyes:   No icterus.   Conjunctiva pink. PERRLA. Ears:  Unable to assess  Neck:  Supple; no masses or thyroidomegaly Lungs: Respirations even and unlabored. Lungs clear to auscultation bilaterally.   No wheezes, crackles, or rhonchi.  Heart:  Regular rate and rhythm;  Without murmur, clicks, rubs or gallops Abdomen:  Soft, mild distension ,  nontender. Normal bowel sounds. No appreciable masses or hepatomegaly.  No rebound or guarding.  Neurologic:  Alert and oriented x o . Skin:  Intact without significant lesions or rashes. Cervical Nodes:  No significant cervical adenopathy. Psych:  Unable to asess  LAB RESULTS: Recent Labs    02/17/20 0326 02/18/20 0620 02/19/20 0501  WBC 10.6* 11.5* 10.8*  HGB 9.0* 8.3* 8.1*  HCT 26.8* 25.3* 24.8*  PLT 137* 145* 123*   BMET Recent Labs    02/17/20 0326 02/18/20 0620 02/19/20 0501  NA 140 140 140  K 3.2* 3.4* 3.5  CL 95* 95* 95*  CO2 29 30 31   GLUCOSE 107* 134* 136*  BUN 28* 32* 36*  CREATININE 2.31* 2.45* 2.86*  CALCIUM 7.9* 8.0* 7.7*   LFT Recent Labs    02/19/20 0501  PROT 6.0*  ALBUMIN 2.2*  AST 45*  ALT 19  ALKPHOS 92  BILITOT 2.0*  BILIDIR 0.7*  IBILI 1.3*   PT/INR Recent Labs    02/18/20 0620 02/19/20 0501  LABPROT 32.9* 31.9*  INR 3.4* 3.2*    STUDIES: CT ABDOMEN PELVIS WO CONTRAST  Result Date: 02/18/2020 CLINICAL DATA:  Patient with abdominal distension. Evaluate for obstruction. EXAM: CT ABDOMEN AND PELVIS WITHOUT CONTRAST TECHNIQUE: Multidetector CT imaging of the abdomen  and pelvis was performed following the standard protocol without IV contrast. COMPARISON:  CT abdomen pelvis 02/14/2020. FINDINGS: Lower chest: Heart is enlarged. Aortic vascular calcifications. Moderate left and small right pleural effusions. Patchy ground-glass and consolidative opacities within the right upper lobe. Dependent consolidative opacities within the lower lobes bilaterally. Hepatobiliary: The liver is small and nodular in contour most compatible with cirrhosis. No intrahepatic or extrahepatic biliary ductal dilatation. Pancreas: Unremarkable Spleen: Unremarkable Adrenals/Urinary Tract: Normal adrenal glands. Kidneys are symmetric in size. Two small stones inferior pole left kidney. Urinary bladder is decompressed with a Foley catheter. Stomach/Bowel: There are prominent and distended loops of small bowel within the left hemiabdomen measuring up to 4.4 cm (image 50; series 3). There is gradual transition to more decompressed small bowel noted distally. Normal morphology of the stomach. Vascular/Lymphatic: Normal caliber abdominal aorta. Peripheral calcified atherosclerotic plaque. No retroperitoneal lymphadenopathy. Multiple mesenteric varices. Reproductive: Heterogeneous prostate. Other: Small bilateral fat containing inguinal hernias. Soft tissue anasarca. Large volume ascites. No free intraperitoneal air. Musculoskeletal: 2 degenerative changes.  No acute process. IMPRESSION: 1. There are prominent and distended loops of small bowel within the left hemiabdomen measuring up to 4.4 cm. There is gradual transition to more decompressed small bowel distally. Findings are nonspecific however may represent ileus or early/partial small bowel obstruction. 2. Findings compatible with cirrhosis and portal venous hypertension including large volume ascites. 3. Moderate left and small right pleural effusions. 4. Patchy ground-glass and consolidative opacities within the right upper lobe and bilateral lower lobes  which may represent infection or edema. Recommend follow-up chest CT in 4-6 weeks after resolution of acute symptomatology to ensure resolution. 5. Nonobstructing left nephrolithiasis. 6. Aortic atherosclerosis. Electronically Signed   By: Lovey Newcomer M.D.   On: 02/18/2020 12:28   DG Abd 1 View  Result Date: 02/19/2020 CLINICAL DATA:  In patient encounter for small bowel obstruction EXAM: ABDOMEN - 1 VIEW COMPARISON:  Abdomen and pelvis CT from yesterday FINDINGS: Stable degree of small and large bowel gas with small bowel loops measuring up to 4.4 cm. There is an enteric tube with tip at the stomach and side port near the GE junction. IMPRESSION: 1. Unchanged degree of gaseous  bowel distension. No change in the pattern to suggest evolving small-bowel obstruction, favor ileus. 2. Enteric tube with tip over the stomach and side port near the GE junction. Electronically Signed   By: Monte Fantasia M.D.   On: 02/19/2020 10:04   DG Chest Port 1 View  Result Date: 02/18/2020 CLINICAL DATA:  NG tube placement. EXAM: PORTABLE CHEST 1 VIEW COMPARISON:  02/18/2020 FINDINGS: An enteric tube is present with tip in the left upper quadrant consistent with location in the proximal stomach. The proximal side hole projects at or just below the level of the EG junction. Right PICC line with tip over the low SVC region. Cardiac pacemaker. Cardiac enlargement with mild central vascular congestion. Hazy perihilar appearance may represent edema or pneumonia. No definite pleural effusion. No pneumothorax. IMPRESSION: Enteric tube tip is in the left upper quadrant consistent with location in the proximal stomach. Cardiac enlargement with pulmonary vascular congestion and perihilar edema/infiltrates. Electronically Signed   By: Lucienne Capers M.D.   On: 02/18/2020 20:06   DG Chest Port 1 View  Result Date: 02/18/2020 CLINICAL DATA:  70 year old male status post central line placement. EXAM: PORTABLE CHEST 1 VIEW COMPARISON:   Chest radiograph dated 02/14/2020. FINDINGS: Interval placement of a right-sided PICC with tip over central SVC close to the cavoatrial junction. Enteric tube extends below the diaphragm with tip beyond the inferior margin of the image. Shallow inspiration. There is cardiomegaly with vascular congestion and mild edema. No large pleural effusion or pneumothorax. Atherosclerotic calcification of the aorta. Left pectoral pacemaker device. IMPRESSION: Interval placement of a right-sided PICC with tip over central SVC. No pneumothorax. Electronically Signed   By: Anner Crete M.D.   On: 02/18/2020 19:13   [place] EKG SITE RITE  Result Date: 02/18/2020 If [facility] image not attached, placement could not be confirmed due to current cardiac rhythm.     Impression / Plan:   Christian Berg is a 70 y.o. y/o male with a history of Karlene Lineman cirrhosis admitted with initially features of partial small bowel obstruction treated conservatively with an NG tube presently x-ray shows possible ileus.No blood mentioned from NG tube.  Overnight he had an episode of rectal bleeding for which have been consulted.  No significant drop in hemoglobin from baseline.Likely secondary to trauma from enema and being on Lovenox at the same time   Last EGD was 3 months back by Dr. Haig Prophet which showed no esophageal varices but demonstrated a bleeding duodenal ulcer.  At that point of time he was on warfarin.  Presently he has developed acute kidney injury.  Unclear if this is prerenal or hepatorenal.His INR is 3.2 which could indicate acute liver failure or effects of Lovenox.No evidence of SBP on 02/16/2020  Plan 1.  Rectal bleeding likely secondary to trauma due to enema,  .  At this point of time cannot obtain a colonoscopy as he has small bowel ileus and cannot tolerate a bowel prep.  There is no evidence of a variceal bleed as the NG tube has not aspirated any blood.  2.  Hepatic encephalopathy lactulose enema, Xifaxan when he can  take orally.Encephelopathy likely worse due to AKI secondary to small bowel ileus.   Nephrology is following for his acute kidney injury.  If they feel it is hepatorenal syndrome would suggest adding octreotide and midodrine in addition.IF no improvement in mental status consider CT head.  3.  If There is no contraindication consider reversing elevated INR with vitamin K 10  mg subcutaneous daily for a total of 3 doses 1 dose daily.  Thank you for involving me in the care of this patient.      LOS: 4 days   Christian Bellows, MD  02/19/2020, 2:03 PM

## 2020-02-19 NOTE — Plan of Care (Signed)

## 2020-02-19 NOTE — Progress Notes (Signed)
[facility] Progress Note  Patient: Christian Berg    WUJ:[id number]  DOA: 02/05/2020     Date of Service: the patient was seen and examined on 02/19/2020  Chief Complaint  Patient presents with  . Abdominal Pain   Brief hospital course: Christian Berg is a 70 y.o. male with medical history significant for morbid obesity (bmi 43), history of liver cirrhosis, diabetes mellitus, A. fib who was brought into the emergency room by EMS for evaluation of mental status changes.  His wife states that over the last couple of days he has had very poor oral intake and has been weak and tired.  She also notes that over the last 24 hours he has been very forgetful and increasingly confused.  Patient is currently oriented to person and place but not to time which is unusual for him.  His wife is also concerned that his urine output has decreased over the last 24 to 48 hours and he has not had a bowel movement in 4 days.  She also notes generalized body swelling despite taking his diuretics. He denies having any fever or chills, no abdominal pain, no dysuria, no frequency, no diarrhea, no headache, no dizziness, no lightheadedness, no chest pain or shortness of breath, no cough, no nausea, no vomiting, no dizziness or lightheadedness. Labs show sodium 141, potassium 2.9, chloride 94, bicarb 31, glucose 103, BUN 26, creatinine 2.15 above the baseline of 1.6, calcium 8.4, albumin 2.1, AST 52, ALT 18, total protein 7.2, ammonia 49, total bilirubin 2.0, BNP 781, troponin 24, white count 6.5, hemoglobin 9.9, hematocrit 30.1, MCV 103, RDW 21, platelet count 188 Respiratory viral panel is negative Peritoneal fluid analysis reveals 116 nucleated cells CT scan of abdomen and pelvis shows cirrhosis with large volume ascites and anasarca. Small pleural effusions, greater on the left. Chest x-ray reviewed by me shows CHF Twelve-lead EKG reviewed by me shows A. fib with a rapid ventricular rate   ED Course: Patient  is a 71 year old Caucasian male who was brought to the ER by EMS for evaluation of confusion and poor oral intake.  Patient noted to have worsening of his renal function from baseline (1.6 >> 2.15), his ammonia level is elevated and he has not had a bowel movement in about 4 days.  He will be admitted to the hospital for further evaluation.    Assessment and Plan: Principal Problem:   Hepatic encephalopathy ([place]) Active Problems:   (HFpEF) heart failure with preserved ejection fraction (HCC)   Atrial fibrillation with RVR (HCC)   NASH (nonalcoholic steatohepatitis)   CKD stage 3 due to type 2 diabetes mellitus (HCC)   Obesity   Hypokalemia    ileus or early/partial small bowel Obstruction. 1/22 patient developed severe abdominal pain and nausea vomiting, still no BM so suspected small bowel obstruction CT scan abdominal pelvis reviewed Keep n.p.o. NG tube with intermittent suction General surgery consulted recommended NG tube with intermittent suction, no bowel obstruction most likely ileus due to cirrhosis and ascites. Ammonia level elevated, started lactulose enema   Rectal bleeding developed on 1/23 As per RN rectal bleeding was noticed Continue monitor H&H and transfuse as needed GI consulted, we will follow for further recommendation    Hepatic encephalopathy Patient appears more forgetful and confused and is only oriented to person and place, not to time which is not his baseline Patient was constipated despite being on lactulose.   Held lactulose ordered due to ileus double up on 1/22  Patient is empirically on Rocephin 2 g IV daily for presumed SBP 1/23 ammonia level 72, started lactulose enema twice daily, general surgery agreed with the plan   Ascites due to cirrhosis, presented with anasarca Abdominal distention due to ascites and patient was complaining of pain and discomfort, Patient agreed for paracentesis S/p US guided paracentesis 9.9 L yellowish fluid was  tapped IV albumin 12.5 g q6h x 4 doses after paracentesis Continue IV Lasix infusion to diurese    AKI on CKD stage IIIb, due to DM and hepatorenal syndrome Continue to  monitor urine output and renal function Nephrology consulted Urology consulted and Foley catheter was inserted    History of chronic HFpEF  TTE 08/05/2019 shows LVEF 60-65%, no wall motion abnormality, diastolic function indeterminate. Patient noted to have bilateral pleural effusions on imaging but he is not short of breath at this time Hold torsemide and metolazone due to worsening renal function (at baseline patient has a serum creatinine of 1.6 but today on admission it is 2.15) Maintain low-sodium diet Continue metoprolol   Chronic permanent A. fib with rapid ventricular rate Patient was on metoprolol which has been discontinued due to soft blood pressure  Patient not on long-term anticoagulation therapy due to increased risk for bleeding 1/21 started Cardizem 30 mg p.o. every 8 hourly 1/22 use Cardizem IV as needed, as patient was not able to take anything p.o. due to SBO 1/23 DC'd beta-blocker, started midodrine 5 mg p.o. 3 times daily due to low blood pressure   Diabetes mellitus with complications of stage III chronic kidney disease Maintain consistent carbohydrate diet Sliding scale insulin for glycemic control   Hypokalemia Secondary to diuretic use Supplement potassium magnesium levels 1.8 monitor and replete magnesium level   Morbid obesity Body mass index is 43.91 kg/m.  Complicates overall prognosis and care Interventions: Calorie restricted diet advised to lose body weight and daily exercise if possible         Diet: Carb modified DVT Prophylaxis: Subcutaneous Lovenox   Advance goals of care discussion: DNR  Family Communication: family was not present at bedside, at the time of interview.  The pt provided permission to discuss medical plan with the family. Opportunity was  given to ask question and all questions were answered satisfactorily.   Disposition:  Pt is from Home, admitted with anasarca, hepatic encephalopathy and volume overload.  Patient developed ileus and rectal bleeding.  GI and general surgery consulted, which precludes a safe discharge. Discharge to Home, when condition improves.   Patient's condition is very sick, needs to stay for >3 days.  Subjective: No significant overnight issues, patient feels very comfortable after NG tube insertion and suctioning denies any abdominal pain.  Patient is still remains confused, does not remember that he was vomiting yesterday.   Physical Exam: General:  alert oriented to time and person, responds, still mildly confused Appear in mild distress, affect appropriate Eyes: PERRLA ENT: Oral Mucosa Clear, moist  Neck: no JVD,  Cardiovascular: S1 and S2 Present, no Murmur,  Respiratory: decreased respiratory effort, Bilateral Air entry equal and Decreased, bibasilar crackles, no significant wheezing appreciated Abdomen: Bowel Sound sluggish, distended due to ascites and edematous, generalized tenderness,  Skin: Disseminated superficial actinic keratosis Extremities: 2-3+ Pedal edema, no calf tenderness, continue Ace wraps Neurologic: without any new focal findings Gait not checked due to patient safety concerns  Vitals:   02/19/20 0115 02/19/20 0424 02/19/20 0811 02/19/20 1212  BP: 119/64 (!) 99/55 120/76 (!) 126/36  Pulse: [name])  109 (!) 110 100 (!) 102  Resp: 18 19 19 18   Temp: (!) 97.5 F (36.4 C) (!) 97.4 F (36.3 C) 98 F (36.7 C) 98.1 F (36.7 C)  TempSrc: Oral Oral Axillary   SpO2: 97% 94% 96% 94%  Weight:  121.2 kg    Height:        Intake/Output Summary (Last 24 hours) at 02/19/2020 1339 Last data filed at 02/18/2020 8786 Gross per 24 hour  Intake 15.74 ml  Output 1000 ml  Net -984.26 ml   Filed Weights   02/14/20 2210 02/17/20 0412 02/19/20 0424  Weight: 131 kg 122.2 kg 121.2 kg     Data Reviewed: I have personally reviewed and interpreted daily labs, tele strips, imagings as discussed above. I reviewed all nursing notes, pharmacy notes, vitals, pertinent old records I have discussed plan of care as described above with RN and patient/family.  CBC: Recent Labs  Lab 02/14/20 2214 02/16/20 0404 02/17/20 0326 02/18/20 0620 02/19/20 0501  WBC 6.5 7.2 10.6* 11.5* 10.8*  HGB 9.9* 8.9* 9.0* 8.3* 8.1*  HCT 30.1* 27.0* 26.8* 25.3* 24.8*  MCV 103.1* 100.7* 101.5* 102.0* 102.9*  PLT 188 165 137* 145* 767*   Basic Metabolic Panel: Recent Labs  Lab 02/14/20 2214 02/01/2020 0048 02/16/20 0404 02/16/20 1049 02/17/20 0326 02/18/20 0620 02/19/20 0501  NA 141  --  142  --  140 140 140  K 2.9*  --  2.9*  --  3.2* 3.4* 3.5  CL 94*  --  98  --  95* 95* 95*  CO2 31  --  32  --  29 30 31   GLUCOSE 103*  --  99  --  107* 134* 136*  BUN 26*  --  26*  --  28* 32* 36*  CREATININE 2.15*  --  2.13*  --  2.31* 2.45* 2.86*  CALCIUM 8.4*  --  7.8*  --  7.9* 8.0* 7.7*  MG  --  2.0  --   --  1.8 2.0 1.9  PHOS  --   --   --  3.2 3.1 2.9 3.2    Studies: DG Abd 1 View  Result Date: 02/19/2020 CLINICAL DATA:  In patient encounter for small bowel obstruction EXAM: ABDOMEN - 1 VIEW COMPARISON:  Abdomen and pelvis CT from yesterday FINDINGS: Stable degree of small and large bowel gas with small bowel loops measuring up to 4.4 cm. There is an enteric tube with tip at the stomach and side port near the GE junction. IMPRESSION: 1. Unchanged degree of gaseous bowel distension. No change in the pattern to suggest evolving small-bowel obstruction, favor ileus. 2. Enteric tube with tip over the stomach and side port near the GE junction. Electronically Signed   By: Monte Fantasia M.D.   On: 02/19/2020 10:04   DG Chest Port 1 View  Result Date: 02/18/2020 CLINICAL DATA:  NG tube placement. EXAM: PORTABLE CHEST 1 VIEW COMPARISON:  02/18/2020 FINDINGS: An enteric tube is present with tip in  the left upper quadrant consistent with location in the proximal stomach. The proximal side hole projects at or just below the level of the EG junction. Right PICC line with tip over the low SVC region. Cardiac pacemaker. Cardiac enlargement with mild central vascular congestion. Hazy perihilar appearance may represent edema or pneumonia. No definite pleural effusion. No pneumothorax. IMPRESSION: Enteric tube tip is in the left upper quadrant consistent with location in the proximal stomach. Cardiac enlargement with pulmonary vascular congestion and perihilar  edema/infiltrates. Electronically Signed   By: Lucienne Capers M.D.   On: 02/18/2020 20:06   DG Chest Port 1 View  Result Date: 02/18/2020 CLINICAL DATA:  70 year old male status post central line placement. EXAM: PORTABLE CHEST 1 VIEW COMPARISON:  Chest radiograph dated 02/14/2020. FINDINGS: Interval placement of a right-sided PICC with tip over central SVC close to the cavoatrial junction. Enteric tube extends below the diaphragm with tip beyond the inferior margin of the image. Shallow inspiration. There is cardiomegaly with vascular congestion and mild edema. No large pleural effusion or pneumothorax. Atherosclerotic calcification of the aorta. Left pectoral pacemaker device. IMPRESSION: Interval placement of a right-sided PICC with tip over central SVC. No pneumothorax. Electronically Signed   By: Anner Crete M.D.   On: 02/18/2020 19:13   [place] EKG SITE RITE  Result Date: 02/18/2020 If [facility] image not attached, placement could not be confirmed due to current cardiac rhythm.   Scheduled Meds: . Chlorhexidine Gluconate Cloth  6 each Topical Daily  . diltiazem  30 mg Oral Q8H  . enoxaparin (LOVENOX) injection  0.5 mg/kg Subcutaneous Q24H  . feeding supplement (NEPRO CARB STEADY)  237 mL Oral TID BM  . ferrous sulfate  325 mg Oral Q breakfast  . Gerhardt's butt cream   Topical BID  . hydrocerin  1 application Topical Daily  . insulin  aspart  0-20 Units Subcutaneous TID WC  . lactulose  300 mL Rectal BID  . multivitamin with minerals  1 tablet Oral Daily  . pantoprazole (PROTONIX) IV  40 mg Intravenous Q12H  . pravastatin  20 mg Oral q1800  . sodium chloride flush  10-40 mL Intracatheter Q12H  . sodium chloride flush  3 mL Intravenous Q12H   Continuous Infusions: . sodium chloride    . cefTRIAXone (ROCEPHIN)  IV 2 g (02/19/20 1002)  . furosemide (LASIX) 200 mg in dextrose 5% 100 mL (36m/mL) infusion 5 mg/hr (02/18/20 1822)   PRN Meds: sodium chloride, albuterol, diltiazem, ketoconazole, midodrine, morphine injection, ondansetron **OR** ondansetron (ZOFRAN) IV, oxyCODONE, phenol, simethicone, sodium chloride flush, sodium chloride flush, traZODone  Time spent: 35 minutes  Author: DVal Riles MD [facility] 02/19/2020 1:39 PM  To reach On-call, see care teams to locate the attending and reach out to them via [email or web address] If 7PM-7AM, please contact night-coverage If you still have difficulty reaching the attending provider, please page the [facility](Director on Call) for [facility] on amion for assistance.

## 2020-02-20 ENCOUNTER — Inpatient Hospital Stay: Payer: Medicare HMO

## 2020-02-20 ENCOUNTER — Other Ambulatory Visit: Payer: Self-pay | Admitting: Cardiology

## 2020-02-20 DIAGNOSIS — K7581 Nonalcoholic steatohepatitis (NASH): Secondary | ICD-10-CM

## 2020-02-20 DIAGNOSIS — Z515 Encounter for palliative care: Secondary | ICD-10-CM

## 2020-02-20 DIAGNOSIS — K625 Hemorrhage of anus and rectum: Secondary | ICD-10-CM | POA: Diagnosis not present

## 2020-02-20 DIAGNOSIS — I5033 Acute on chronic diastolic (congestive) heart failure: Secondary | ICD-10-CM | POA: Diagnosis not present

## 2020-02-20 DIAGNOSIS — I4891 Unspecified atrial fibrillation: Secondary | ICD-10-CM | POA: Diagnosis not present

## 2020-02-20 DIAGNOSIS — K7290 Hepatic failure, unspecified without coma: Secondary | ICD-10-CM | POA: Diagnosis not present

## 2020-02-20 DIAGNOSIS — R188 Other ascites: Secondary | ICD-10-CM

## 2020-02-20 DIAGNOSIS — K567 Ileus, unspecified: Secondary | ICD-10-CM

## 2020-02-20 LAB — HEPATITIS B CORE ANTIBODY, IGM: Hep B C IgM: NONREACTIVE

## 2020-02-20 LAB — AMMONIA: Ammonia: 175 umol/L — ABNORMAL HIGH (ref 9–35)

## 2020-02-20 LAB — GLUCOSE, CAPILLARY
Glucose-Capillary: 128 mg/dL — ABNORMAL HIGH (ref 70–99)
Glucose-Capillary: 120 mg/dL — ABNORMAL HIGH (ref 70–99)
Glucose-Capillary: 140 mg/dL — ABNORMAL HIGH (ref 70–99)

## 2020-02-20 LAB — CBC
HCT: 24.8 % — ABNORMAL LOW (ref 39.0–52.0)
HCT: 22.3 % — ABNORMAL LOW (ref 39.0–52.0)
Hemoglobin: 8.1 g/dL — ABNORMAL LOW (ref 13.0–17.0)
Hemoglobin: 7.5 g/dL — ABNORMAL LOW (ref 13.0–17.0)
MCH: 33.5 pg (ref 26.0–34.0)
MCH: 33.9 pg (ref 26.0–34.0)
MCHC: 32.7 g/dL (ref 30.0–36.0)
MCHC: 33.6 g/dL (ref 30.0–36.0)
MCV: 102.5 fL — ABNORMAL HIGH (ref 80.0–100.0)
MCV: 100.9 fL — ABNORMAL HIGH (ref 80.0–100.0)
Platelets: 131 10*3/uL — ABNORMAL LOW (ref 150–400)
Platelets: 116 10*3/uL — ABNORMAL LOW (ref 150–400)
RBC: 2.42 MIL/uL — ABNORMAL LOW (ref 4.22–5.81)
RBC: 2.21 MIL/uL — ABNORMAL LOW (ref 4.22–5.81)
RDW: 21.2 % — ABNORMAL HIGH (ref 11.5–15.5)
RDW: 21.6 % — ABNORMAL HIGH (ref 11.5–15.5)
WBC: 11.9 10*3/uL — ABNORMAL HIGH (ref 4.0–10.5)
WBC: 10.7 10*3/uL — ABNORMAL HIGH (ref 4.0–10.5)
nRBC: 0 % (ref 0.0–0.2)
nRBC: 0.3 % — ABNORMAL HIGH (ref 0.0–0.2)

## 2020-02-20 LAB — BASIC METABOLIC PANEL
Anion gap: 18 — ABNORMAL HIGH (ref 5–15)
BUN: 39 mg/dL — ABNORMAL HIGH (ref 8–23)
CO2: 28 mmol/L (ref 22–32)
Calcium: 7.7 mg/dL — ABNORMAL LOW (ref 8.9–10.3)
Chloride: 94 mmol/L — ABNORMAL LOW (ref 98–111)
Creatinine, Ser: 3.24 mg/dL — ABNORMAL HIGH (ref 0.61–1.24)
GFR, Estimated: 20 mL/min — ABNORMAL LOW (ref >60)
Glucose, Bld: 135 mg/dL — ABNORMAL HIGH (ref 70–99)
Potassium: 3.4 mmol/L — ABNORMAL LOW (ref 3.5–5.1)
Sodium: 140 mmol/L (ref 135–145)

## 2020-02-20 LAB — BODY FLUID CULTURE: Culture: NO GROWTH

## 2020-02-20 LAB — HEPATITIS B SURFACE ANTIGEN: Hepatitis B Surface Ag: NONREACTIVE

## 2020-02-20 LAB — HEPATITIS C ANTIBODY: HCV Ab: NONREACTIVE

## 2020-02-20 LAB — PROTIME-INR
INR: 3 — ABNORMAL HIGH (ref 0.8–1.2)
Prothrombin Time: 29.9 seconds — ABNORMAL HIGH (ref 11.4–15.2)

## 2020-02-20 LAB — HEPATITIS B SURFACE ANTIBODY,QUALITATIVE: Hep B S Ab: NONREACTIVE

## 2020-02-20 MED ORDER — GLYCOPYRROLATE 0.2 MG/ML IJ SOLN
0.2000 mg | INTRAMUSCULAR | Status: DC | PRN
  Administered 2020-02-21 (×3): 0.2 mg via INTRAVENOUS
  Filled 2020-02-20 (×6): qty 1

## 2020-02-20 MED ORDER — OXYCODONE HCL 5 MG PO TABS: 5.0000 mg | ORAL_TABLET | ORAL | Status: DC | PRN

## 2020-02-20 MED ORDER — AMIODARONE HCL IN DEXTROSE 360-4.14 MG/200ML-% IV SOLN
60.0000 mg/h | INTRAVENOUS | Status: DC
  Administered 2020-02-20 (×2): 60 mg/h via INTRAVENOUS
  Filled 2020-02-20: qty 200

## 2020-02-20 MED ORDER — VITAMIN K1 10 MG/ML IJ SOLN
10.0000 mg | Freq: Every day | INTRAVENOUS | Status: DC
  Administered 2020-02-20: 10 mg via INTRAVENOUS
  Filled 2020-02-20 (×2): qty 1

## 2020-02-20 MED ORDER — ENOXAPARIN SODIUM 40 MG/0.4ML ~~LOC~~ SOLN: 40.0000 mg | SUBCUTANEOUS | Status: DC

## 2020-02-20 MED ORDER — LORAZEPAM 2 MG/ML IJ SOLN
0.5000 mg | INTRAMUSCULAR | Status: DC | PRN
  Administered 2020-02-20 – 2020-02-21 (×2): 0.5 mg via INTRAVENOUS
  Filled 2020-02-20 (×2): qty 1

## 2020-02-20 MED ORDER — LACTULOSE 10 GM/15ML PO SOLN
30.0000 g | Freq: Three times a day (TID) | ORAL | Status: DC
  Administered 2020-02-20: 30 g
  Filled 2020-02-20: qty 60

## 2020-02-20 MED ORDER — AMIODARONE HCL IN DEXTROSE 360-4.14 MG/200ML-% IV SOLN: 30.0000 mg/h | INTRAVENOUS | Status: DC

## 2020-02-20 MED ORDER — FREE WATER: 50.0000 mL | Status: DC

## 2020-02-20 MED ORDER — PROSOURCE TF PO LIQD: 45.0000 mL | Freq: Two times a day (BID) | ORAL | Status: DC

## 2020-02-20 MED ORDER — AMIODARONE LOAD VIA INFUSION
150.0000 mg | Freq: Once | INTRAVENOUS | Status: AC
  Administered 2020-02-20: 150 mg via INTRAVENOUS
  Filled 2020-02-20: qty 83.34

## 2020-02-20 MED ORDER — OSMOLITE 1.5 CAL PO LIQD: 1000.0000 mL | ORAL | Status: DC

## 2020-02-20 MED ORDER — AMIODARONE HCL IN DEXTROSE 360-4.14 MG/200ML-% IV SOLN
60.0000 mg/h | INTRAVENOUS | Status: DC
  Administered 2020-02-20: 60 mg/h via INTRAVENOUS
  Filled 2020-02-20: qty 200

## 2020-02-20 MED ORDER — SODIUM CHLORIDE 0.9 % IV SOLN
50.0000 ug/h | INTRAVENOUS | Status: DC
  Administered 2020-02-20: 50 ug/h via INTRAVENOUS
  Filled 2020-02-20: qty 5
  Filled 2020-02-20 (×2): qty 1
  Filled 2020-02-20: qty 5

## 2020-02-20 MED ORDER — HYDROMORPHONE HCL 1 MG/ML IJ SOLN
0.5000 mg | INTRAMUSCULAR | Status: DC | PRN
  Administered 2020-02-20 – 2020-02-21 (×6): 0.5 mg via INTRAVENOUS
  Filled 2020-02-20 (×6): qty 1

## 2020-02-20 MED ORDER — SODIUM CHLORIDE 0.9 % IV SOLN
50.0000 ug/h | INTRAVENOUS | Status: DC
  Filled 2020-02-20 (×3): qty 1

## 2020-02-20 NOTE — Progress Notes (Signed)
Subjective:  CC: Christian Berg is a 70 y.o. male  Hospital stay day 5,   pSBO  HPI: No acute issues overnight. Lactulose given and noted to have BM.  ROS:  Unable to obtain due to patient mentation  Objective:   Temp:  [97.4 F (36.3 C)-97.9 F (36.6 C)] 97.9 F (36.6 C) (01/24 0831) Pulse Rate:  [92-131] 100 (01/24 2019) Resp:  [18-19] 19 (01/24 2019) BP: (89-167)/(29-82) 90/54 (01/24 2019) SpO2:  [96 %-100 %] 96 % (01/24 2019) Weight:  [121.3 kg] 121.3 kg (01/24 0426)     Height: 5' 8"  (172.7 cm) Weight: 121.3 kg BMI (Calculated): 40.68   Intake/Output this shift:   Intake/Output Summary (Last 24 hours) at 02/20/2020 2029 Last data filed at 02/20/2020 1652 Gross per 24 hour  Intake 0 ml  Output 627 ml  Net -627 ml    Constitutional :  uncooperative and unable to answer questions  Respiratory:  clear to auscultation bilaterally  Cardiovascular:  regular rate and rhythm  Gastrointestinal: soft, non-tender; bowel sounds normal; no masses,  no organomegaly. NG with dark bilious output  Skin: Cool and moist.   Psychiatric: Normal affect, non-agitated, not confused       LABS:  CMP Latest Ref Rng & Units 02/20/2020 02/19/2020 02/18/2020  Glucose 70 - 99 mg/dL 135(H) 136(H) 134(H)  BUN 8 - 23 mg/dL 39(H) 36(H) 32(H)  Creatinine 0.61 - 1.24 mg/dL 3.24(H) 2.86(H) 2.45(H)  Sodium 135 - 145 mmol/L 140 140 140  Potassium 3.5 - 5.1 mmol/L 3.4(L) 3.5 3.4(L)  Chloride 98 - 111 mmol/L 94(L) 95(L) 95(L)  CO2 22 - 32 mmol/L 28 31 30   Calcium 8.9 - 10.3 mg/dL 7.7(L) 7.7(L) 8.0(L)  Total Protein 6.5 - 8.1 g/dL - 6.0(L) 6.4(L)  Total Bilirubin 0.3 - 1.2 mg/dL - 2.0(H) 1.8(H)  Alkaline Phos 38 - 126 U/L - 92 96  AST 15 - 41 U/L - 45(H) 40  ALT 0 - 44 U/L - 19 16   CBC Latest Ref Rng & Units 02/20/2020 02/20/2020 02/19/2020  WBC 4.0 - 10.5 K/uL 10.7(H) 11.9(H) 12.4(H)  Hemoglobin 13.0 - 17.0 g/dL 7.5(L) 8.1(L) 8.6(L)  Hematocrit 39.0 - 52.0 % 22.3(L) 24.8(L) 25.2(L)  Platelets 150 -  400 K/uL 116(L) 131(L) 117(L)    RADS: n/a Assessment:  Ileus  Report of BM noted so placed on clamp trial and passed, but RN reported primary will like to keep in for now at time of exam.  Chart check later in the day noted family decision for comfort care only, so further NG management per primary.  Surgery to sign off

## 2020-02-20 NOTE — Progress Notes (Signed)
Nutrition Follow Up Note   DOCUMENTATION CODES:   Morbid obesity  INTERVENTION:   Once nasogastric tube confirmed in good position:  Osmolite 1.5 _0 /hr- Initiate at 33m/hr and increase by 160mhr q 8 hours until goal rate is reached.   Pro-Source 4579mID via tube, provides 40kcal and 11g of protein per serving   Free water flushes 68m32m hours to maintain tube patency   Regimen provides 2600kcal/day, 127g/day protein and 1580ml97m free water   Pt at high refeed risk; recommend monitor potassium, magnesium and phosphorus labs daily until stable  NUTRITION DIAGNOSIS:   Inadequate oral intake related to poor appetite as evidenced by meal completion < 25%.  GOAL:   Patient will meet greater than or equal to 90% of their needs  -not met   MONITOR:   PO intake,Supplement acceptance,Labs,TF tolerance,Skin,I & O's  ASSESSMENT:   69 y.45 male with medical history significant for morbid obesity (bmi 43), history of liver cirrhosis, diabetes mellitus, A. fib who was brought into the emergency room by EMS for evaluation of mental status   Pt continues to have poor appetite and oral intake in hospital; pt eating only sips and bites of meals. Pt is refusing Nepro supplements. Pt developed nausea, vomiting and abdominal distension on 1/22 and was found to have an ileus. NGT placed to LIS but was clamped today. RN reports that pt's symptoms have resolved. Plan is to initiate tube feeds today via NGT. Pt is at high refeed risk. NGT noted with tip in stomach and side port at GE junction; tube has been advanced and radiograph is pending. Will start tube feeds at trickle rate and advance as tolerated given recent ileus.  Per chart, pt is down 21lbs(7%) since admit; RD unsure how much weight loss is related to fluid changes.   Medications reviewed and include: lovenox, ferrous sulfate, insulin, lactulose, MVI, protonix, ceftriaxone, octreotide, vitamin K  Labs reviewed: K 3.4(L), BUN  39(H), creat 3.24(H) P 3.2 wnl, Mg 1.9 wnl 1/23 Ammonia- 175(H) Wbc- 10.7(H), Hgb 7.5(L), Hct 22.3(L) cbgs- 128, 120 x 24 hrs  Diet Order:    Diet Order            Diet NPO time specified Except for: Ice Chips  Diet effective now                EDUCATION NEEDS:   Education needs have been addressed  Skin:  Skin Assessment: Reviewed RN Assessment (2 cm x 4 cm x 0.1 cm- sacrum)  Last BM:  1/24- type 7  Height:   Ht Readings from Last 1 Encounters:  02/14/20 _1  (1.727 m)    Weight:   Wt Readings from Last 1 Encounters:  02/20/20 121.3 kg    Ideal Body Weight:  70 kg  BMI:  Body mass index is 40.67 kg/m.  Estimated Nutritional Needs:   Kcal:  2500-2800kcal/day  Protein:  >125g/day  Fluid:  1.8-2.1L/day  CaseyKoleen DistanceRD, LDN Please refer to AMIONSt. John'S Regional Medical CenterRD and/or RD on-call/weekend/after hours pager

## 2020-02-20 NOTE — Progress Notes (Signed)
Christian Darby, MD [address]  [name]  [place], [place] [address]  Main: [phone]  Fax: [phone] Pager: [phone]   Subjective: Patient remains obtunded and nonresponsive.  Per patient's nurse, no further episodes of rectal bleeding overnight or today   Objective: Vital signs in last 24 hours: Vitals:   02/20/20 1308 02/20/20 1322 02/20/20 1428 02/20/20 1628  BP: (!) 89/53 (!) 89/55 (!) 96/57 (!) 103/49  Pulse: (!) 122 (!) 115 (!) 122 (!) 122  Resp:      Temp:      TempSrc:      SpO2:      Weight:      Height:       Weight change: 0.136 kg  Intake/Output Summary (Last 24 hours) at 02/20/2020 1741 Last data filed at 02/20/2020 1652 Gross per 24 hour  Intake 0 ml  Output 1527 ml  Net -1527 ml     Exam: Heart:: Regular rate and rhythm, S1S2 present or without murmur or extra heart sounds Lungs: normal and clear to auscultation Abdomen: soft, grossly distended, tympanic   Lab Results: CBC Latest Ref Rng & Units 02/20/2020 02/20/2020 02/19/2020  WBC 4.0 - 10.5 K/uL 10.7(H) 11.9(H) 12.4(H)  Hemoglobin 13.0 - 17.0 g/dL 7.5(L) 8.1(L) 8.6(L)  Hematocrit 39.0 - 52.0 % 22.3(L) 24.8(L) 25.2(L)  Platelets 150 - 400 K/uL 116(L) 131(L) 117(L)   CMP Latest Ref Rng & Units 02/20/2020 02/19/2020 02/18/2020  Glucose 70 - 99 mg/dL 135(H) 136(H) 134(H)  BUN 8 - 23 mg/dL 39(H) 36(H) 32(H)  Creatinine 0.61 - 1.24 mg/dL 3.24(H) 2.86(H) 2.45(H)  Sodium 135 - 145 mmol/L 140 140 140  Potassium 3.5 - 5.1 mmol/L 3.4(L) 3.5 3.4(L)  Chloride 98 - 111 mmol/L 94(L) 95(L) 95(L)  CO2 22 - 32 mmol/L 28 31 30   Calcium 8.9 - 10.3 mg/dL 7.7(L) 7.7(L) 8.0(L)  Total Protein 6.5 - 8.1 g/dL - 6.0(L) 6.4(L)  Total Bilirubin 0.3 - 1.2 mg/dL - 2.0(H) 1.8(H)  Alkaline Phos 38 - 126 U/L - 92 96  AST 15 - 41 U/L - 45(H) 40  ALT 0 - 44 U/L - 19 16    Micro Results: Recent Results (from the past 240 hour(s))  Resp Panel by RT-PCR (Flu A&B, Covid) Nasopharyngeal Swab     Status:  None   Collection Time: 01/28/2020  7:56 AM   Specimen: Nasopharyngeal Swab; Nasopharyngeal(NP) swabs in vial transport medium  Result Value Ref Range Status   SARS Coronavirus 2 by RT PCR NEGATIVE NEGATIVE Final    Comment: (NOTE) SARS-CoV-2 target nucleic acids are NOT DETECTED.  The SARS-CoV-2 RNA is generally detectable in upper respiratory specimens during the acute phase of infection. The lowest concentration of SARS-CoV-2 viral copies this assay can detect is 138 copies/mL. A negative result does not preclude SARS-Cov-2 infection and should not be used as the sole basis for treatment or other patient management decisions. A negative result may occur with  improper specimen collection/handling, submission of specimen other than nasopharyngeal swab, presence of viral mutation(s) within the areas targeted by this assay, and inadequate number of viral copies(<138 copies/mL). A negative result must be combined with clinical observations, patient history, and epidemiological information. The expected result is Negative.  Fact Sheet for Patients:  EntrepreneurPulse.com.au  Fact Sheet for Healthcare Providers:  IncredibleEmployment.be  This test is no t yet approved or cleared by the [place] FDA and  has been authorized for detection and/or diagnosis of SARS-CoV-2 by FDA under an  Emergency Use Authorization (EUA). This EUA will remain  in effect (meaning this test can be used) for the duration of the COVID-19 declaration under Section 564(b)(1) of the Act, 21 U.S.C.section 360bbb-3(b)(1), unless the authorization is terminated  or revoked sooner.       Influenza A by PCR NEGATIVE NEGATIVE Final   Influenza B by PCR NEGATIVE NEGATIVE Final    Comment: (NOTE) The Xpert Xpress SARS-CoV-2/FLU/RSV plus assay is intended as an aid in the diagnosis of influenza from Nasopharyngeal swab specimens and should not be used as a sole basis for  treatment. Nasal washings and aspirates are unacceptable for Xpert Xpress SARS-CoV-2/FLU/RSV testing.  Fact Sheet for Patients: EntrepreneurPulse.com.au  Fact Sheet for Healthcare Providers: IncredibleEmployment.be  This test is not yet approved or cleared by the [place] FDA and has been authorized for detection and/or diagnosis of SARS-CoV-2 by FDA under an Emergency Use Authorization (EUA). This EUA will remain in effect (meaning this test can be used) for the duration of the COVID-19 declaration under Section 564(b)(1) of the Act, 21 U.S.C. section 360bbb-3(b)(1), unless the authorization is terminated or revoked.  Performed at [facility], [address], [place], [place] [address]   Body fluid culture     Status: None   Collection Time: 02/10/2020  9:05 AM   Specimen: Peritoneal Washings; Body Fluid  Result Value Ref Range Status   Specimen Description   Final    PERITONEAL Performed at [facility], [address], [place], [place] [address]    Special Requests   Final    NONE Performed at [facility], [address] [place], [place] [address]    Gram Stain   Final    FEW WBC PRESENT, PREDOMINANTLY MONONUCLEAR NO ORGANISMS SEEN    Culture   Final    NO GROWTH Performed at [facility], [place] [address], [place], [place] [address]    Report Status 02/18/2020 FINAL  Final  Body fluid culture     Status: None   Collection Time: 02/16/20  3:35 PM   Specimen: PATH Cytology Peritoneal fluid  Result Value Ref Range Status   Specimen Description   Final    PERITONEAL Performed at [facility], [address], [place], [place] [address]    Special Requests   Final    NONE Performed at [facility], [address] [place], [place] [address]    Gram Stain   Final    RARE WBC PRESENT, PREDOMINANTLY MONONUCLEAR NO ORGANISMS SEEN    Culture   Final    NO GROWTH 3  DAYS Performed at [facility], [place] [address], [place], [place] [address]    Report Status 02/20/2020 FINAL  Final   Studies/Results: DG Abd 1 View  Result Date: 02/20/2020 CLINICAL DATA:  Check gastric catheter placement EXAM: ABDOMEN - 1 VIEW COMPARISON:  Film from earlier in the same day. FINDINGS: Gastric catheter is been advanced further into the stomach. The stomach is partially distended with air. Colonic gas is noted as well. IMPRESSION: Gastric catheter within the stomach. Electronically Signed   By: Inez Catalina M.D.   On: 02/20/2020 17:04   DG Abd 1 View  Result Date: 02/20/2020 CLINICAL DATA:  Abdominal distension. EXAM: ABDOMEN - 1 VIEW COMPARISON:  02/19/2020 FINDINGS: An enteric tube is unchanged with tip projecting over the proximal aspect of the stomach and side hole in the expected region of the GE junction. Gas is again  seen in loops of small and large bowel. Mild distension of small bowel loops has mildly decreased. No acute osseous abnormality is seen. IMPRESSION: Mildly decreased gaseous bowel distension favoring decreased ileus. Electronically Signed   By: Logan Bores M.D.   On: 02/20/2020 09:57   DG Abd 1 View  Result Date: 02/19/2020 CLINICAL DATA:  In patient encounter for small bowel obstruction EXAM: ABDOMEN - 1 VIEW COMPARISON:  Abdomen and pelvis CT from yesterday FINDINGS: Stable degree of small and large bowel gas with small bowel loops measuring up to 4.4 cm. There is an enteric tube with tip at the stomach and side port near the GE junction. IMPRESSION: 1. Unchanged degree of gaseous bowel distension. No change in the pattern to suggest evolving small-bowel obstruction, favor ileus. 2. Enteric tube with tip over the stomach and side port near the GE junction. Electronically Signed   By: Monte Fantasia M.D.   On: 02/19/2020 10:04   DG Chest Port 1 View  Result Date: 02/18/2020 CLINICAL DATA:  NG tube placement. EXAM: PORTABLE CHEST 1 VIEW COMPARISON:   02/18/2020 FINDINGS: An enteric tube is present with tip in the left upper quadrant consistent with location in the proximal stomach. The proximal side hole projects at or just below the level of the EG junction. Right PICC line with tip over the low SVC region. Cardiac pacemaker. Cardiac enlargement with mild central vascular congestion. Hazy perihilar appearance may represent edema or pneumonia. No definite pleural effusion. No pneumothorax. IMPRESSION: Enteric tube tip is in the left upper quadrant consistent with location in the proximal stomach. Cardiac enlargement with pulmonary vascular congestion and perihilar edema/infiltrates. Electronically Signed   By: Lucienne Capers M.D.   On: 02/18/2020 20:06   DG Chest Port 1 View  Result Date: 02/18/2020 CLINICAL DATA:  70 year old male status post central line placement. EXAM: PORTABLE CHEST 1 VIEW COMPARISON:  Chest radiograph dated 02/14/2020. FINDINGS: Interval placement of a right-sided PICC with tip over central SVC close to the cavoatrial junction. Enteric tube extends below the diaphragm with tip beyond the inferior margin of the image. Shallow inspiration. There is cardiomegaly with vascular congestion and mild edema. No large pleural effusion or pneumothorax. Atherosclerotic calcification of the aorta. Left pectoral pacemaker device. IMPRESSION: Interval placement of a right-sided PICC with tip over central SVC. No pneumothorax. Electronically Signed   By: Anner Crete M.D.   On: 02/18/2020 19:13   Medications:  I have reviewed the patient's current medications. Prior to Admission:  Medications Prior to Admission  Medication Sig Dispense Refill Last Dose  . albuterol (VENTOLIN HFA) 108 (90 Base) MCG/ACT inhaler Inhale 2 puffs into the lungs every 6 (six) hours as needed for wheezing or shortness of breath. 18 g 3 prn at prn  . lovastatin (MEVACOR) 20 MG tablet Take 1 tablet (20 mg total) by mouth every evening. 90 tablet 3 Past Week at  Unknown time  . metFORMIN (GLUCOPHAGE) 1000 MG tablet Take 1 tablet by mouth twice daily (Patient taking differently: Take 500 mg by mouth daily.) 180 tablet 0 02/14/2020 at Unknown time  . metoprolol tartrate (LOPRESSOR) 50 MG tablet Take 1 tablet (50 mg total) by mouth 2 (two) times daily. (Patient taking differently: Take 100 mg by mouth daily.) 60 tablet 1 Past Week at Unknown time  . Multiple Vitamin (MULTIVITAMIN WITH MINERALS) TABS tablet Take 1 tablet by mouth daily.   02/14/2020 at Unknown time  . torsemide (DEMADEX) 10 MG tablet Take 1-2 tablets (10-20  mg total) by mouth daily. 30 tablet 1 02/14/2020 at Unknown time  . traMADol-acetaminophen (ULTRACET) 37.5-325 MG tablet Take 1 tablet by mouth every 6 (six) hours as needed for severe pain. 10 tablet 0 prn at prn  . warfarin (COUMADIN) 5 MG tablet Take 5 mg by mouth 2 (two) times a week.   Past Week at Unknown time   Scheduled: . Gerhardt's butt cream   Topical BID  . hydrocerin  1 application Topical Daily  . lactulose  30 g Per Tube TID  . pantoprazole (PROTONIX) IV  40 mg Intravenous Q12H  . sodium chloride flush  10-40 mL Intracatheter Q12H  . sodium chloride flush  3 mL Intravenous Q12H   Continuous: . sodium chloride    . octreotide  (SANDOSTATIN)    IV infusion     ZES:PQZRAQ chloride, albuterol, diltiazem, glycopyrrolate, HYDROmorphone (DILAUDID) injection, ketoconazole, LORazepam, ondansetron **OR** ondansetron (ZOFRAN) IV, oxyCODONE, phenol, simethicone, sodium chloride flush, sodium chloride flush, traZODone Anti-infectives (From admission, onward)   Start     Dose/Rate Route Frequency Ordered Stop   02/09/2020 1000  cefTRIAXone (ROCEPHIN) 2 g in sodium chloride 0.9 % 100 mL IVPB  Status:  Discontinued        2 g 200 mL/hr over 30 Minutes Intravenous Every 24 hours 02/13/2020 0958 02/20/20 1731     Scheduled Meds: . Gerhardt's butt cream   Topical BID  . hydrocerin  1 application Topical Daily  . lactulose  30 g Per Tube  TID  . pantoprazole (PROTONIX) IV  40 mg Intravenous Q12H  . sodium chloride flush  10-40 mL Intracatheter Q12H  . sodium chloride flush  3 mL Intravenous Q12H   Continuous Infusions: . sodium chloride    . octreotide  (SANDOSTATIN)    IV infusion     PRN Meds:.sodium chloride, albuterol, diltiazem, glycopyrrolate, HYDROmorphone (DILAUDID) injection, ketoconazole, LORazepam, ondansetron **OR** ondansetron (ZOFRAN) IV, oxyCODONE, phenol, simethicone, sodium chloride flush, sodium chloride flush, traZODone   Assessment: Principal Problem:   Hepatic encephalopathy (HCC) Active Problems:   (HFpEF) heart failure with preserved ejection fraction (HCC)   Atrial fibrillation with RVR (HCC)   NASH (nonalcoholic steatohepatitis)   CKD stage 3 due to type 2 diabetes mellitus ([place])   Obesity   Hypokalemia  Decompensated cirrhosis of liver with hepatic encephalopathy and ileus, worsening AKI.  No further episodes of rectal bleeding Patient is currently made comfort care due to overall poor prognosis after family discussion GI will sign off at this time    LOS: 5 days   Christian Berg 02/20/2020, 5:41 PM

## 2020-02-20 NOTE — Consult Note (Signed)
CARDIOLOGY CONSULT NOTE               Patient ID: Christian Berg MRN: [id number] DOB/AGE: 1950/04/27 70 y.o.  Admit date: 01/31/2020 Referring Physician Dr. Val Riles Primary Physician Dr. Lelon Huh  Primary Cardiologist Dr. Serafina Royals  Reason for Consultation Atrial fibrillation with RVR   HPI: Christian Berg is a 70 year old male with a past medical history significant for longstanding persistent atrial fibrillation, currently not on anticoagulation due to bleeding risk, history of sick sinus syndrome s/p permanent pacemaker implantation, history of HFpEF, carotid artery disease, type 2 diabetes, chronic kidney disease, liver cirrhosis, and morbid obesity who presented to the ED on 02/18/2020 via EMS for altered mental status.  Per report, Christian Berg's wife noticed a few day history of progressive weakness, fatigue, and poor oral intake.  Workup in the ED was significant for a potassium of 2.9, creatinine of 2.15, BNP of 781, high sensitivity troponin borderline elevated 24, 24 respectively, ECG revealing atrial fibrillation with RVR, rate of 109bpm, chest xray revealing mild vascular congestion, and abdominal CT revealing cirrhosis with large volume ascites and anasarca.   Christian Berg is followed in outpatient cardiology by Dr. Nehemiah Massed. Most recent echocardiogram on 08/05/19 revealed normal RV and LV systolic function with an EF estimated between 60-65% with mild to moderate aortic valve sclerosis with no evidence of AS.   02/20/20: Christian Berg is lying in bed, in no acute distress, but is a Level 5 Caveat. Unable to provide any history at this time. No family members present at bedside.   Review of systems complete and found to be negative unless listed above     Past Medical History:  Diagnosis Date  . A-fib ([place])   . Carotid stenosis   . CHF (congestive heart failure) ([place])   . Degenerative disc disease, lumbar    s/p injury  . Diabetes mellitus without complication  ([place])   . Disseminated superficial actinic porokeratosis   . Dysrhythmia    A-FIB, palpatations  . 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 Fatty liver   . History of degenerative disc disease   . History of kidney stones   . Hypercholesteremia   . Hypertension   . Kidney stones   . Obesity   . Pacemaker    inactive  . Peripheral vascular disease ([place])    Carotid stenosis  . Presence of permanent cardiac pacemaker    Inactive  . TIA (transient ischemic attack) 2011   No deficits  . TIA (transient ischemic attack)   . Vitamin B12 deficiency     Past Surgical History:  Procedure Laterality Date  . APPENDECTOMY  2004  . CARDIAC CATHETERIZATION    . CARDIAC PACEMAKER PLACEMENT  2005  . CATARACT EXTRACTION W/PHACO Right 06/14/2014   Procedure: CATARACT EXTRACTION PHACO AND INTRAOCULAR LENS PLACEMENT (IOC);  Surgeon: Leandrew Koyanagi, MD;  Location: [facility];  Service: Ophthalmology;  Laterality: Right;  . CATARACT EXTRACTION W/PHACO Left 10/27/2018   Procedure: CATARACT EXTRACTION PHACO AND INTRAOCULAR LENS PLACEMENT (IOC) LEFT DIABETIC  00:46.4  18.6%  8.63;  Surgeon: Leandrew Koyanagi, MD;  Location: [facility];  Service: Ophthalmology;  Laterality: Left;  Diabetic - oral meds  . CYSTOSCOPY W/ RETROGRADES Bilateral 08/09/2014   Procedure: CYSTOSCOPY WITH RETROGRADE PYELOGRAM;  Surgeon: Hollice Espy, MD;  Location: [facility];  Service: Urology;  Laterality: Bilateral;  . ESOPHAGOGASTRODUODENOSCOPY (EGD) WITH PROPOFOL N/A 10/13/2019   Procedure: ESOPHAGOGASTRODUODENOSCOPY (EGD) WITH PROPOFOL;  Surgeon: Lesly Rubenstein,  MD;  Location: [facility];  Service: Endoscopy;  Laterality: N/A;  . INTUBATION-ENDOTRACHEAL WITH TRACHEOSTOMY STANDBY  03/30/2017   Procedure: INTUBATION-ENDOTRACHEAL WITH TRACHEOSTOMY STANDBY;  Surgeon: Carloyn Manner, MD;  Location: [facility];  Service: ENT;;    Medications Prior to Admission  Medication Sig Dispense Refill Last Dose  . albuterol  (VENTOLIN HFA) 108 (90 Base) MCG/ACT inhaler Inhale 2 puffs into the lungs every 6 (six) hours as needed for wheezing or shortness of breath. 18 g 3 prn at prn  . lovastatin (MEVACOR) 20 MG tablet Take 1 tablet (20 mg total) by mouth every evening. 90 tablet 3 Past Week at Unknown time  . metFORMIN (GLUCOPHAGE) 1000 MG tablet Take 1 tablet by mouth twice daily (Patient taking differently: Take 500 mg by mouth daily.) 180 tablet 0 02/14/2020 at Unknown time  . metoprolol tartrate (LOPRESSOR) 50 MG tablet Take 1 tablet (50 mg total) by mouth 2 (two) times daily. (Patient taking differently: Take 100 mg by mouth daily.) 60 tablet 1 Past Week at Unknown time  . Multiple Vitamin (MULTIVITAMIN WITH MINERALS) TABS tablet Take 1 tablet by mouth daily.   02/14/2020 at Unknown time  . torsemide (DEMADEX) 10 MG tablet Take 1-2 tablets (10-20 mg total) by mouth daily. 30 tablet 1 02/14/2020 at Unknown time  . traMADol-acetaminophen (ULTRACET) 37.5-325 MG tablet Take 1 tablet by mouth every 6 (six) hours as needed for severe pain. 10 tablet 0 prn at prn  . warfarin (COUMADIN) 5 MG tablet Take 5 mg by mouth 2 (two) times a week.   Past Week at Unknown time   Social History   Socioeconomic History  . Marital status: Married    Spouse name: Not on file  . Number of children: 2  . Years of education: Not on file  . Highest education level: High school graduate  Occupational History  . Occupation: retired  . Occupation: disable  Tobacco Use  . Smoking status: Never Smoker  . Smokeless tobacco: Never Used  Vaping Use  . Vaping Use: Never used  Substance and Sexual Activity  . Alcohol use: No    Alcohol/week: 0.0 standard drinks  . Drug use: No  . Sexual activity: Not on file  Other Topics Concern  . Not on file  Social History Narrative   Lives at home with wife. Dependent at baseline.    Social Determinants of Health   Financial Resource Strain: Low Risk   . Difficulty of Paying Living Expenses: Not  hard at all  Food Insecurity: No Food Insecurity  . Worried About Charity fundraiser in the Last Year: Never true  . Ran Out of Food in the Last Year: Never true  Transportation Needs: No Transportation Needs  . Lack of Transportation (Medical): No  . Lack of Transportation (Non-Medical): No  Physical Activity: Inactive  . Days of Exercise per Week: 0 days  . Minutes of Exercise per Session: 0 min  Stress: No Stress Concern Present  . Feeling of Stress : Not at all  Social Connections: Moderately Isolated  . Frequency of Communication with Friends and Family: Twice a week  . Frequency of Social Gatherings with Friends and Family: Once a week  . Attends Religious Services: Never  . Active Member of Clubs or Organizations: No  . Attends Archivist Meetings: Never  . Marital Status: Married  Human resources officer Violence: Not At Risk  . Fear of Current or Ex-Partner: No  . Emotionally Abused: No  .  Physically Abused: No  . Sexually Abused: No    Family History  Adopted: Yes  Problem Relation Age of Onset  . Heart disease Mother   . Fibromyalgia Sister   . Hypertension Daughter   . Migraines Daughter   . Kidney disease Neg Hx   . Prostate cancer Neg Hx   . Bladder Cancer Neg Hx       Review of systems complete and found to be negative unless listed above      PHYSICAL EXAM  General: Lying in bed, asleep HEENT:  Normocephalic and atramatic Neck:  No JVD.  Lungs: Clear bilaterally to auscultation and percussion. Heart: Irregularly irregular, rapid ventricular response. Normal S1 and S2 without gallops or murmurs.  Abdomen: Bowel sounds are positive, abdomen soft and non-tender  Msk:  Back normal, normal gait. Normal strength and tone for age. Extremities: Mittens on bilateral hands, raised, dark lesions on lower legs    Labs:   Lab Results  Component Value Date   WBC 11.9 (H) 02/20/2020   HGB 8.1 (L) 02/20/2020   HCT 24.8 (L) 02/20/2020   MCV 102.5 (H)  02/20/2020   PLT 131 (L) 02/20/2020    Recent Labs  Lab 02/19/20 0501 02/20/20 0539  NA 140 140  K 3.5 3.4*  CL 95* 94*  CO2 31 28  BUN 36* 39*  CREATININE 2.86* 3.24*  CALCIUM 7.7* 7.7*  PROT 6.0*  --   BILITOT 2.0*  --   ALKPHOS 92  --   ALT 19  --   AST 45*  --   GLUCOSE 136* 135*   Lab Results  Component Value Date   TROPONINI <0.03 04/03/2017    Lab Results  Component Value Date   CHOL 117 03/02/2019   CHOL 133 10/23/2017   CHOL 105 05/24/2015   Lab Results  Component Value Date   HDL 37 (L) 03/02/2019   HDL 41 10/23/2017   HDL 36 (L) 05/24/2015   Lab Results  Component Value Date   LDLCALC 48 03/02/2019   LDLCALC 48 10/23/2017   LDLCALC 42 05/24/2015   Lab Results  Component Value Date   TRIG 197 (H) 03/02/2019   TRIG 218 (H) 10/23/2017   TRIG 136 05/24/2015   Lab Results  Component Value Date   CHOLHDL 3.2 03/02/2019   CHOLHDL 3.2 10/23/2017   CHOLHDL 2.9 05/24/2015   No results found for: LDLDIRECT    Radiology: CT ABDOMEN PELVIS WO CONTRAST  Result Date: 02/18/2020 CLINICAL DATA:  Patient with abdominal distension. Evaluate for obstruction. EXAM: CT ABDOMEN AND PELVIS WITHOUT CONTRAST TECHNIQUE: Multidetector CT imaging of the abdomen and pelvis was performed following the standard protocol without IV contrast. COMPARISON:  CT abdomen pelvis 02/24/2020. FINDINGS: Lower chest: Heart is enlarged. Aortic vascular calcifications. Moderate left and small right pleural effusions. Patchy ground-glass and consolidative opacities within the right upper lobe. Dependent consolidative opacities within the lower lobes bilaterally. Hepatobiliary: The liver is small and nodular in contour most compatible with cirrhosis. No intrahepatic or extrahepatic biliary ductal dilatation. Pancreas: Unremarkable Spleen: Unremarkable Adrenals/Urinary Tract: Normal adrenal glands. Kidneys are symmetric in size. Two small stones inferior pole left kidney. Urinary bladder is  decompressed with a Foley catheter. Stomach/Bowel: There are prominent and distended loops of small bowel within the left hemiabdomen measuring up to 4.4 cm (image 50; series 3). There is gradual transition to more decompressed small bowel noted distally. Normal morphology of the stomach. Vascular/Lymphatic: Normal caliber abdominal aorta. Peripheral calcified atherosclerotic  plaque. No retroperitoneal lymphadenopathy. Multiple mesenteric varices. Reproductive: Heterogeneous prostate. Other: Small bilateral fat containing inguinal hernias. Soft tissue anasarca. Large volume ascites. No free intraperitoneal air. Musculoskeletal: 2 degenerative changes.  No acute process. IMPRESSION: 1. There are prominent and distended loops of small bowel within the left hemiabdomen measuring up to 4.4 cm. There is gradual transition to more decompressed small bowel distally. Findings are nonspecific however may represent ileus or early/partial small bowel obstruction. 2. Findings compatible with cirrhosis and portal venous hypertension including large volume ascites. 3. Moderate left and small right pleural effusions. 4. Patchy ground-glass and consolidative opacities within the right upper lobe and bilateral lower lobes which may represent infection or edema. Recommend follow-up chest CT in 4-6 weeks after resolution of acute symptomatology to ensure resolution. 5. Nonobstructing left nephrolithiasis. 6. Aortic atherosclerosis. Electronically Signed   By: Lovey Newcomer M.D.   On: 02/18/2020 12:28   CT ABDOMEN PELVIS WO CONTRAST  Result Date: 02/25/2020 CLINICAL DATA:  Abdominal pain and distension EXAM: CT ABDOMEN AND PELVIS WITHOUT CONTRAST TECHNIQUE: Multidetector CT imaging of the abdomen and pelvis was performed following the standard protocol without IV contrast. COMPARISON:  10/22/2019 FINDINGS: Lower chest: Borderline heart size. Dual-chamber pacer leads. Small left more than right pleural effusion mild increased from  prior Hepatobiliary: History of cirrhosis with small volume liver.No evidence of biliary obstruction or stone. Pancreas: Unremarkable. Spleen: Unremarkable. Adrenals/Urinary Tract: Negative adrenals. No hydronephrosis or ureteral stone. Symmetric renal atrophic appearance. There is asymmetric left lower pole thinning with two renal calculi best seen on coronal reformats. Unremarkable bladder. Stomach/Bowel:  No obstruction. No visible bowel inflammation. Vascular/Lymphatic: No acute vascular abnormality. Atherosclerosis. Mesenteric varices associated with the enlarged right gonadal vein. No mass or adenopathy. Reproductive:No pathologic findings. Other: Large volume low-density ascites. Anasarca which is progressed from before. Musculoskeletal: Advanced and generalized spinal degeneration. IMPRESSION: 1. Cirrhosis with large volume ascites and anasarca. 2. Small pleural effusions, greater on the left. Electronically Signed   By: Monte Fantasia M.D.   On: 02/10/2020 07:01   DG Chest 1 View  Result Date: 02/14/2020 CLINICAL DATA:  Pain and abdominal distension EXAM: CHEST  1 VIEW COMPARISON:  10/28/2019 FINDINGS: Cardiac shadow is enlarged but stable. Pacing device is again seen and stable. The overall inspiratory effort is poor. Increased vascular congestion is noted consistent with mild CHF. Mild bibasilar atelectatic changes are noted related to the poor inspiratory effort. Postsurgical changes in the left humerus are seen. IMPRESSION: Changes of mild CHF. Electronically Signed   By: Inez Catalina M.D.   On: 02/14/2020 22:37   DG Abd 1 View  Result Date: 02/20/2020 CLINICAL DATA:  Abdominal distension. EXAM: ABDOMEN - 1 VIEW COMPARISON:  02/19/2020 FINDINGS: An enteric tube is unchanged with tip projecting over the proximal aspect of the stomach and side hole in the expected region of the GE junction. Gas is again seen in loops of small and large bowel. Mild distension of small bowel loops has mildly  decreased. No acute osseous abnormality is seen. IMPRESSION: Mildly decreased gaseous bowel distension favoring decreased ileus. Electronically Signed   By: Logan Bores M.D.   On: 02/20/2020 09:57   DG Abd 1 View  Result Date: 02/19/2020 CLINICAL DATA:  In patient encounter for small bowel obstruction EXAM: ABDOMEN - 1 VIEW COMPARISON:  Abdomen and pelvis CT from yesterday FINDINGS: Stable degree of small and large bowel gas with small bowel loops measuring up to 4.4 cm. There is an enteric tube with tip  at the stomach and side port near the GE junction. IMPRESSION: 1. Unchanged degree of gaseous bowel distension. No change in the pattern to suggest evolving small-bowel obstruction, favor ileus. 2. Enteric tube with tip over the stomach and side port near the GE junction. Electronically Signed   By: Monte Fantasia M.D.   On: 02/19/2020 10:04   DG Abdomen 1 View  Result Date: 02/16/2020 CLINICAL DATA:  Abdominal distension EXAM: ABDOMEN - 1 VIEW COMPARISON:  March 30, 2017 FINDINGS: The bowel gas pattern is normal. No radio-opaque calculi or other significant radiographic abnormality are seen. There are degenerative changes of the lumbar spine and bilateral hips. IMPRESSION: Negative. Electronically Signed   By: Constance Holster M.D.   On: 02/11/2020 02:39   US Paracentesis  Result Date: 02/16/2020 INDICATION: Cirrhosis and ascites. EXAM: ULTRASOUND GUIDED PARACENTESIS MEDICATIONS: None. COMPLICATIONS: None immediate. PROCEDURE: Informed written consent was obtained from the patient's wife after a discussion of the risks, benefits and alternatives to treatment. A timeout was performed prior to the initiation of the procedure. Initial ultrasound was performed to localize ascites. The left lower abdomen was prepped and draped in the usual sterile fashion. 1% lidocaine was used for local anesthesia. Following this, a 6 Fr Safe-T-Centesis catheter was introduced. An ultrasound image was saved for  documentation purposes. The paracentesis was performed. The catheter was removed and a dressing was applied. The patient tolerated the procedure well without immediate post procedural complication. FINDINGS: A total of approximately 9.9 L of yellowish fluid was removed. Samples were sent to the laboratory as requested by the clinical team. IMPRESSION: Successful ultrasound-guided paracentesis yielding 9.9 liters of peritoneal fluid. Electronically Signed   By: Aletta Edouard M.D.   On: 02/16/2020 16:51   DG Chest Port 1 View  Result Date: 02/18/2020 CLINICAL DATA:  NG tube placement. EXAM: PORTABLE CHEST 1 VIEW COMPARISON:  02/18/2020 FINDINGS: An enteric tube is present with tip in the left upper quadrant consistent with location in the proximal stomach. The proximal side hole projects at or just below the level of the EG junction. Right PICC line with tip over the low SVC region. Cardiac pacemaker. Cardiac enlargement with mild central vascular congestion. Hazy perihilar appearance may represent edema or pneumonia. No definite pleural effusion. No pneumothorax. IMPRESSION: Enteric tube tip is in the left upper quadrant consistent with location in the proximal stomach. Cardiac enlargement with pulmonary vascular congestion and perihilar edema/infiltrates. Electronically Signed   By: Lucienne Capers M.D.   On: 02/18/2020 20:06   DG Chest Port 1 View  Result Date: 02/18/2020 CLINICAL DATA:  69 year old male status post central line placement. EXAM: PORTABLE CHEST 1 VIEW COMPARISON:  Chest radiograph dated 02/14/2020. FINDINGS: Interval placement of a right-sided PICC with tip over central SVC close to the cavoatrial junction. Enteric tube extends below the diaphragm with tip beyond the inferior margin of the image. Shallow inspiration. There is cardiomegaly with vascular congestion and mild edema. No large pleural effusion or pneumothorax. Atherosclerotic calcification of the aorta. Left pectoral pacemaker  device. IMPRESSION: Interval placement of a right-sided PICC with tip over central SVC. No pneumothorax. Electronically Signed   By: Anner Crete M.D.   On: 02/18/2020 19:13   [place] EKG SITE RITE  Result Date: 02/18/2020 If [facility] image not attached, placement could not be confirmed due to current cardiac rhythm.   EKG: Atrial fibrillation with RVR, rate of 109bpm   ASSESSMENT AND PLAN:  1.  Atrial fibrillation with RVR   -  In the setting of hepatic encephalopathy, probable SBO  -BP remains soft, however, given concerns over probable SBO, will remain on IV amiodarone for now and closely follow BP/HR  -Would agree to remain off of metoprolol   -Currently not on anticoagulation due to bleeding risk; episode of rectal bleeding reported 1/23  2.   History of HFpEF  -Most recent echocardiogram revealed preserved LV function   -Agree to hold Lasix and metolazone due to worsening renal function   -Hold metoprolol due to hypotension   3.  Chronic kidney disease  -Nephrology following   4.  Type 2 diabetes  -SSI   5.  Ascites due to cirrhosis   -Paracentesis performed    The history, physical exam findings, and plan of care were all discussed with Dr. Bartholome Bill, and all decision making was made in collaboration.   Signed: Avie Arenas PA-C 02/20/2020, 1:23 PM

## 2020-02-20 NOTE — Consult Note (Signed)
[facility] Nurse wound follow up Wound type:Weekly [place] boots on Monday.  Wraps removed.  No open wounds but raised wart like lesions present to bilateral lower  Legs.  Measurement: 0.5 cm raised brown lesions Wound BZX:YDSW Drainage (amount, consistency, odor) none Periwound:chronic skin changes to legs Dressing procedure/placement/frequency:Cleanse legs with soap and water and pat dry.  Wrap both legs with zinc layer from below toes to below knee  Secure with self adherent coban layer. Change weekly on MOnday.  Will follow.  Domenic Moras MSN, RN, FNP-BC [facility] Wound, Ostomy, Continence Nurse Pager [phone]

## 2020-02-20 NOTE — Consult Note (Signed)
                                                                                 Consultation Note Date: 02/20/2020   Patient Name: Christian Berg  DOB: 06/05/1950  MRN: [id number]  Age / Sex: 70 y.o., male  PCP: Fisher, Donald E, MD Referring Physician: Kumar, Dileep, MD  Reason for Consultation: Establishing goals of care  HPI/Patient Profile: 70 y.o. male  with past medical history of morbid obesity (BMI 43), liver cirrhosis, DM, afib admitted on 02/14/2020 with mental status changes, weakness, and poor oral intake. CT a/p reveals cirrhosis with large volume ascites and anasarca, small pleural effusions. CXR reveals CHF. S/p paracentesis with 9.9L removed. Hospital admission for hepatic encephalopathy secondary to liver cirrhosis, ascites, AKI on CKD possibly hepatorenal, CHF, afib RVR. On 1/23 developed rectal bleeding, GI consulted. Patient with ileus, no bowel obstruction. Surgery following and recommending NPO and NGT for decompression. Palliative medicine consultation for goals of care.   Clinical Assessment and Goals of Care:  I have reviewed medical records, discussed with care team, and met with patient and wife (Christian Berg) at bedside. Christian Berg will answer to his name, but otherwise lethargic and disoriented. He does not follow commands. He does not appear to be in pain or distress.   I introduced Palliative Medicine as specialized medical care for people living with serious illness. It focuses on providing relief from the symptoms and stress of a serious illness.  We discussed a brief life review of the patient. Married to Christian Berg for 48 years. They have one son and one daughter (who unfortunately is not involved in his life). Son is supportive. Christian Berg has cared for Christian Berg for many years. She shares 10-15 years of chronic debility including liver cirrhosis. He saw a liver specialist once and was resistant to follow-up. Prior to hospitalization, wife reports his health was becoming progressively  worse.   Discussed events leading up to admission and course of hospitalization including diagnoses, interventions, plan of care in detail. Frankly and compassionately shared poor prognosis and concern that Christian Berg's condition is worsening. Christian Berg understands how sick Christian Berg is and acknowledges he has continued to worsen throughout hospitalization. Today he does not recognize her. She was told on Saturday he may not make it through the night.   Wife confirms Christian Berg's decision for DNR code status. "He would not want to be revived."   Medically recommended consideration of comfort focused pathway in order to ensure comfort, peace, dignity ,and relief from suffering as his condition worsens and he nears end of life. Prepared Ms. Christian Berg that Christian Berg may not make it out of the hospital. She shares that her and her son, Christian Berg understand how sick he is.   Wife is considering shift to comfort measures but would like to speak with son prior to making decision. Hard Choices booklet and PMT contact information given. Wife will call me this afternoon about her decision.   1715: Received call from wife, Christian Berg. Christian Berg appreciates conversation earlier and shares that she has spoken with son, Christian Berg. Christian Berg shares her decision to shift towards comfort. She wishes for Christian Berg to "rest in peace" be "comfortable" and free   from pain. She acknowledges how much he has been through for many years. She does not wish to see him suffer. Educated on shift to comfort measures including discontinuation of interventions not aimed at comfort, focus on symptom management, comfort sips/chips, unrestricted visitor access. [name] confirms that her son agrees with this plan. Answered questions. Emotional/spiritual support provided.   Spoke with son, Valarie Merino via telephone. Reviewed care plan and decision for shift to comfort measures. Valarie Merino understands and agrees with this decision. Answered questions. PMT contact information given.    SUMMARY OF  RECOMMENDATIONS    GOC discussion with wife and son.   Family understands diagnoses and poor prognosis. Family is ready for shift to comfort measures only. They wish for Christian Berg to be comfortable and peaceful.   DNR/DNI  Discontinue interventions not aimed at comfort.  Symptom management--see below  Comfort sips/chips. Clear liquid diet ordered.  Continue NGT for now. May help with symptom management. Do not replace if patient removes.   Unrestricted visitor access as patient is nearing EOL. Anticipate he may pass inpatient and have prepared family for this.   Updated care team.  Code Status/Advance Care Planning:  DNR  Symptom Management:   Dilaudid 0.64m IV q2h prn pain/dyspnea/air hunger/tachypnea  Oxycodone 532mPO q3h prn moderate pain/dyspnea/air hunger/tachypnea  Ativan 0.38m56mV q4h prn anxiety  Robinul 0.2mg74m q4h prn secretions.  Zofran 4mg 8mq6h prn n/v  Will leave NGT in place for now to assist with symptom management. If patient removes or tube becomes dislodged, do not replace.   Palliative Prophylaxis:   Aspiration, Bowel Regimen, Delirium Protocol, Frequent Pain Assessment, Oral Care and Turn Reposition  Psycho-social/Spiritual:   Desire for further Chaplaincy support: yes  Additional Recommendations: Caregiving  Support/Resources, Compassionate Wean Education and Education on Hospice  Prognosis:   Poor prognosis likely days if not hours  Discharge Planning: Anticipate hospital death     Primary Diagnoses: Present on Admission: . Hepatic encephalopathy (HCC) [place] heart failure with preserved ejection fraction (HCC) [place] stage 3 due to type 2 diabetes mellitus (HCC) [place] (n[place]coholic ste[place]patitis) . Obesity . Hypokalemia   I have reviewed the medical record, interviewed the patient and family, and examined the patient. The following aspects are pertinent.  Past Medical History:  Diagnosis  Date  . A-fib (HCC) [place] Carotid stenosis   . CHF (congestive heart failure) (HCC) [place] Degenerative disc disease, lumbar    s/p injury  . Diabetes mellitus without complication (HCC) [place] Disseminated superficial actinic porokeratosis   . Dysrhythmia    A-FIB, palpatations  . 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 Fatty liver   . History of degenerative disc disease   . History of kidney stones   . Hypercholesteremia   . Hypertension   . Kidney stones   . Obesity   . Pacemaker    inactive  . Peripheral vascular disease (HCC) [place]  . Presence of permanent cardiac pacemaker    Inactive  . TIA (transient ischemic attack) 2011   No deficits  . TIA (transient ischemic attack)   . Vitamin B12 deficiency    Social History   Socioeconomic History  . Marital status: Married    Spouse name: Not on file  . Number of children: 2  . Years of education: Not on file  . Highest education level: High school graduate  Occupational History  . Occupation: retired  .  Occupation: disable  Tobacco Use  . Smoking status: Never Smoker  . Smokeless tobacco: Never Used  Vaping Use  . Vaping Use: Never used  Substance and Sexual Activity  . Alcohol use: No    Alcohol/week: 0.0 standard drinks  . Drug use: No  . Sexual activity: Not on file  Other Topics Concern  . Not on file  Social History Narrative   Lives at home with wife. Dependent at baseline.    Social Determinants of Health   Financial Resource Strain: Low Risk   . Difficulty of Paying Living Expenses: Not hard at all  Food Insecurity: No Food Insecurity  . Worried About Running Out of Food in the Last Year: Never true  . Ran Out of Food in the Last Year: Never true  Transportation Needs: No Transportation Needs  . Lack of Transportation (Medical): No  . Lack of Transportation (Non-Medical): No  Physical Activity: Inactive  . Days of Exercise per Week: 0 days  . Minutes of Exercise per Session: 0 min  Stress: No Stress  Concern Present  . Feeling of Stress : Not at all  Social Connections: Moderately Isolated  . Frequency of Communication with Friends and Family: Twice a week  . Frequency of Social Gatherings with Friends and Family: Once a week  . Attends Religious Services: Never  . Active Member of Clubs or Organizations: No  . Attends Club or Organization Meetings: Never  . Marital Status: Married   Family History  Adopted: Yes  Problem Relation Age of Onset  . Heart disease Mother   . Fibromyalgia Sister   . Hypertension Daughter   . Migraines Daughter   . Kidney disease Neg Hx   . Prostate cancer Neg Hx   . Bladder Cancer Neg Hx    Scheduled Meds: . Gerhardt's butt cream   Topical BID  . hydrocerin  1 application Topical Daily  . pantoprazole (PROTONIX) IV  40 mg Intravenous Q12H  . sodium chloride flush  10-40 mL Intracatheter Q12H  . sodium chloride flush  3 mL Intravenous Q12H   Continuous Infusions: . sodium chloride     PRN Meds:.sodium chloride, albuterol, diltiazem, glycopyrrolate, HYDROmorphone (DILAUDID) injection, ketoconazole, LORazepam, ondansetron **OR** ondansetron (ZOFRAN) IV, oxyCODONE, phenol, simethicone, sodium chloride flush, sodium chloride flush, traZODone Medications Prior to Admission:  Prior to Admission medications   Medication Sig Start Date End Date Taking? Authorizing Provider  albuterol (VENTOLIN HFA) 108 (90 Base) MCG/ACT inhaler Inhale 2 puffs into the lungs every 6 (six) hours as needed for wheezing or shortness of breath. 01/30/20  Yes Fisher, Donald E, MD  lovastatin (MEVACOR) 20 MG tablet Take 1 tablet (20 mg total) by mouth every evening. 04/08/19  Yes Fisher, Donald E, MD  metFORMIN (GLUCOPHAGE) 1000 MG tablet Take 1 tablet by mouth twice daily Patient taking differently: Take 500 mg by mouth daily. 08/29/19  Yes Fisher, Donald E, MD  metoprolol tartrate (LOPRESSOR) 50 MG tablet Take 1 tablet (50 mg total) by mouth 2 (two) times daily. Patient taking  differently: Take 100 mg by mouth daily. 10/23/19  Yes Griffith, Kelly A, DO  Multiple Vitamin (MULTIVITAMIN WITH MINERALS) TABS tablet Take 1 tablet by mouth daily. 10/24/19  Yes Griffith, Kelly A, DO  torsemide (DEMADEX) 10 MG tablet Take 1-2 tablets (10-20 mg total) by mouth daily. 01/04/20  Yes Fisher, Donald E, MD  traMADol-acetaminophen (ULTRACET) 37.5-325 MG tablet Take 1 tablet by mouth every 6 (six) hours as needed for severe pain. 08/06/19    Yes Loletha Grayer, MD  warfarin (COUMADIN) 5 MG tablet Take 5 mg by mouth 2 (two) times a week. 11/05/19  Yes [provider]   Allergies  Allergen Reactions  . Clindamycin/Lincomycin     Chest discomfort  . Sulfa Antibiotics Hives    Causes Raised red patches all over body  . Sulfasalazine Hives    Causes raised red patches over body   Review of Systems  Unable to perform ROS: Acuity of condition   Physical Exam Vitals and nursing note reviewed.  Constitutional:      Appearance: He is ill-appearing.  HENT:     Head: Normocephalic and atraumatic.  Cardiovascular:     Heart sounds: Normal heart sounds.  Pulmonary:     Effort: No tachypnea, accessory muscle usage or respiratory distress.     Breath sounds: Decreased breath sounds present.  Abdominal:     General: There is distension.     Tenderness: There is no abdominal tenderness.  Skin:    General: Skin is warm and dry.     Coloration: Skin is pale.  Neurological:     Mental Status: He is lethargic.     Comments: Oriented to name, otherwise lethargic and disoriented. Unable to participate in discussion.   Psychiatric:        Attention and Perception: He is inattentive.        Speech: Speech is delayed.        Cognition and Memory: Cognition is impaired.    Vital Signs: BP (!) 103/49   Pulse (!) 122   Temp 97.9 F (36.6 C)   Resp 19   Ht 5' 8" (1.727 m)   Wt 121.3 kg   SpO2 100%   BMI 40.67 kg/m  Pain Scale: PAINAD POSS *See Group Information*:  S-Acceptable,Sleep, easy to arouse Pain Score: 0-No pain   SpO2: SpO2: 100 % O2 Device:SpO2: 100 % O2 Flow Rate: .O2 Flow Rate (L/min): 2 L/min  IO: Intake/output summary:   Intake/Output Summary (Last 24 hours) at 02/20/2020 2021 Last data filed at 02/20/2020 1652 Gross per 24 hour  Intake 0 ml  Output 627 ml  Net -627 ml    LBM: Last BM Date: 02/20/20 Baseline Weight: Weight: 131 kg Most recent weight: Weight: 121.3 kg     Palliative Assessment/Data: PPS 20%   Flowsheet Rows   Flowsheet Row Most Recent Value  Intake Tab   Referral Department Hospitalist  Unit at Time of Referral Cardiac/Telemetry Unit  Palliative Care Primary Diagnosis Other (Comment)  Palliative Care Type New Palliative care  Date first seen by Palliative Care 02/20/20  Clinical Assessment   Palliative Performance Scale Score 20%  Psychosocial & Spiritual Assessment   Palliative Care Outcomes   Patient/Family meeting held? Yes  Who was at the meeting? wife, son  Palliative Care Outcomes Improved pain interventions, Improved non-pain symptom therapy, Clarified goals of care, Counseled regarding hospice, Provided end of life care assistance, Provided psychosocial or spiritual support, Changed to focus on comfort, ACP counseling assistance      Time Total: 48mn Greater than 50%  of this time was spent counseling and coordinating care related to the above assessment and plan.  Signed by:  MIhor Dow DNP, FNP-C Palliative Medicine Team  Phone: [phone]: [phone]  Please contact Palliative Medicine Team phone at [phone] questions and concerns.  For individual provider: See AShea Evans

## 2020-02-20 NOTE — Progress Notes (Signed)
SLP Cancellation Note  Patient Details Name: Christian Berg MRN: [id number] DOB: 14-Oct-1950   Cancelled treatment:       Reason Eval/Treat Not Completed: Patient not medically ready   Per MD, pt is not medically ready for BSE at this time. Currently has NGT placed for suction (currently clamped). Please re-consult when pt is medically appropriate for PO trials.   Nyiesha Beever B. Rutherford Nail M.S., CCC-SLP, [place] [facility] [phone]    Stormy Fabian 02/20/2020, 12:29 PM

## 2020-02-20 NOTE — Progress Notes (Signed)
pts HR sustaining in 130s-140s at rest/ pt asymptomatic/ MD made/ PRN IV Cardizem not effective/ pts BP is also low/ MD made aware/ Cardio consult ordered/ will continue to monitor.

## 2020-02-20 NOTE — Progress Notes (Signed)
Instructed by dietician to advanced NG tube 47ms and have repeat abd xray performed to confirm placement/  MD made aware and ok with recomendations /  NG tube advanced to 59cm/ xray ordered/ will monitor

## 2020-02-20 NOTE — Progress Notes (Signed)
pts BP remains low/ pts asymptomatic / MD aware/ orders to continue amio gtt at this time/ will monitor close

## 2020-02-20 NOTE — Progress Notes (Signed)
[facility] Progress Note  Patient: Christian Berg    FBP:[id number]  DOA: 02/20/2020     Date of Service: the patient was seen and examined on 02/20/2020  Chief Complaint  Patient presents with  . Abdominal Pain   Brief hospital course: Christian Berg is a 70 y.o. male with medical history significant for morbid obesity (bmi 43), history of liver cirrhosis, diabetes mellitus, A. fib who was brought into the emergency room by EMS for evaluation of mental status changes.  His wife states that over the last couple of days he has had very poor oral intake and has been weak and tired.  She also notes that over the last 24 hours he has been very forgetful and increasingly confused.  Patient is currently oriented to person and place but not to time which is unusual for him.  His wife is also concerned that his urine output has decreased over the last 24 to 48 hours and he has not had a bowel movement in 4 days.  She also notes generalized body swelling despite taking his diuretics. He denies having any fever or chills, no abdominal pain, no dysuria, no frequency, no diarrhea, no headache, no dizziness, no lightheadedness, no chest pain or shortness of breath, no cough, no nausea, no vomiting, no dizziness or lightheadedness. Labs show sodium 141, potassium 2.9, chloride 94, bicarb 31, glucose 103, BUN 26, creatinine 2.15 above the baseline of 1.6, calcium 8.4, albumin 2.1, AST 52, ALT 18, total protein 7.2, ammonia 49, total bilirubin 2.0, BNP 781, troponin 24, white count 6.5, hemoglobin 9.9, hematocrit 30.1, MCV 103, RDW 21, platelet count 188 Respiratory viral panel is negative Peritoneal fluid analysis reveals 116 nucleated cells CT scan of abdomen and pelvis shows cirrhosis with large volume ascites and anasarca. Small pleural effusions, greater on the left. Chest x-ray reviewed by me shows CHF Twelve-lead EKG reviewed by me shows A. fib with a rapid ventricular rate   ED Course: Patient  is a 70 year old Caucasian male who was brought to the ER by EMS for evaluation of confusion and poor oral intake.  Patient noted to have worsening of his renal function from baseline (1.6 >> 2.15), his ammonia level is elevated and he has not had a bowel movement in about 4 days.  He will be admitted to the hospital for further evaluation.    Assessment and Plan: Principal Problem:   Hepatic encephalopathy (Christian Berg) Active Problems:   (HFpEF) heart failure with preserved ejection fraction (HCC)   Atrial fibrillation with RVR (HCC)   NASH (nonalcoholic steatohepatitis)   CKD stage 3 due to type 2 diabetes mellitus (HCC)   Obesity   Hypokalemia    ileus or early/partial small bowel Obstruction. 1/22 patient developed severe abdominal pain and nausea vomiting, still no BM so suspected small bowel obstruction CT scan abdominal pelvis reviewed Keep n.p.o. NG tube with intermittent suction General surgery consulted recommended NG tube with intermittent suction, no bowel obstruction most likely ileus due to cirrhosis and ascites. Ammonia level elevated, started lactulose enema   Rectal bleeding developed on 1/23 As per RN rectal bleeding was noticed Continue monitor H&H and transfuse as needed GI consulted, we will follow for further recommendation    Hepatic encephalopathy Patient appears more forgetful and confused and is only oriented to person and place, not to time which is not his baseline Patient was constipated despite being on lactulose.   Held lactulose ordered due to ileus double up on 1/22  Patient is empirically on Rocephin 2 g IV daily for presumed SBP 1/23 ammonia level 72, started lactulose enema twice daily, general surgery agreed with the plan 1/24 NG tube drainage decreased, clamped, may start lactulose via NG tube and start NG tube feeding, dietitian consulted GI recommended to start octreotide, midodrine for hepatorenal syndrome and vitamin K due to high  INR   Coagulopathy, elevated INR secondary to liver failure 1/24 Vitamin K 10 mg IV daily x3 doses, as per GI Follow INR and monitor H&H   Ascites due to cirrhosis, presented with anasarca Abdominal distention due to ascites and patient was complaining of pain and discomfort, Patient agreed for paracentesis S/p US guided paracentesis 9.9 L yellowish fluid was tapped IV albumin 12.5 g q6h x 4 doses after paracentesis Continue IV Lasix infusion to diurese    AKI on CKD stage IIIb, due to DM and hepatorenal syndrome Continue to  monitor urine output and renal function Nephrology consulted Urology consulted and Foley catheter was inserted    History of chronic HFpEF  TTE 08/05/2019 shows LVEF 60-65%, no wall motion abnormality, diastolic function indeterminate. Patient noted to have bilateral pleural effusions on imaging but he is not short of breath at this time Hold torsemide and metolazone due to worsening renal function (at baseline patient has a serum creatinine of 1.6 but today on admission it is 2.15) Maintain low-sodium diet Continue metoprolol   Chronic permanent A. fib with rapid ventricular rate Patient was on metoprolol which has been discontinued due to soft blood pressure  Patient not on long-term anticoagulation therapy due to increased risk for bleeding 1/21 started Cardizem 30 mg p.o. every 8 hourly 1/22 use Cardizem IV as needed, as patient was not able to take anything p.o. due to SBO 1/23 DC'd beta-blocker, started midodrine 5 mg p.o. 3 times daily due to low blood pressure 1/24 A. fib with RVR with low blood pressure, cardiology consulted and patient was started on amiodarone drip Continue to follow cards for further recommendation   Diabetes mellitus with complications of stage III chronic kidney disease Maintain consistent carbohydrate diet Sliding scale insulin for glycemic control   Hypokalemia Secondary to diuretic use Supplement  potassium magnesium levels 1.8 monitor and replete magnesium level   Morbid obesity Body mass index is 43.91 kg/m.  Complicates overall prognosis and care Interventions: Calorie restricted diet advised to lose body weight and daily exercise if possible    Overall prognosis is poor, patient is extremely sick and in a critical condition.  Palliative care consulted for goals of care discussion with family.  Patient was DNR If no improvement then patient may be a good candidate for hospice      Diet: Carb modified DVT Prophylaxis: Subcutaneous Lovenox   Advance goals of care discussion: DNR  Family Communication: family was not present at bedside, at the time of interview.  The pt provided permission to discuss medical plan with the family. Opportunity was given to ask question and all questions were answered satisfactorily.   Disposition:  Pt is from Home, admitted with anasarca, hepatic encephalopathy and volume overload.  Patient developed ileus, rectal bleeding, A. fib with RVR and hypotension.  GI cardiology and general surgery consulted.  Patient's condition is critical, which precludes a safe discharge. Discharge to Home, when condition improves.   Patient is very sick, needs to stay for >5 days.  Subjective: No significant overnight issues, patient remained encephalopathic, very confused today due to high ammonia level.  Was getting  lactulose enema but it was not holding so maybe it was not effective. Patient is very confused, AAO x1 and very lethargic Overall patient's condition is very critical controlled at any time. 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,  Eyes: PERRLA ENT: Oral Mucosa Clear, moist  Neck: no JVD,  Cardiovascular: S1 and S2 Present, no Murmur,  Respiratory: decreased respiratory effort, Bilateral Air entry equal and Decreased, bibasilar  crackles, no significant wheezing appreciated Abdomen: Bowel Sound sluggish, distended due to ascites and edematous, generalized tenderness,  Skin: Disseminated superficial actinic keratosis Extremities: 2-3+ Pedal edema, no calf tenderness, continue Ace wraps Neurologic: without any new focal findings, encephalopathic, lethargic Gait not checked due to patient safety concerns  Vitals:   02/20/20 1240 02/20/20 1308 02/20/20 1322 02/20/20 1428  BP: 101/70 (!) 89/53 (!) 89/55 (!) 96/57  Pulse: (!) 131 (!) 122 (!) 115 (!) 122  Resp:      Temp:      TempSrc:      SpO2:      Weight:      Height:        Intake/Output Summary (Last 24 hours) at 02/20/2020 1547 Last data filed at 02/20/2020 0431 Gross per 24 hour  Intake 0 ml  Output 1300 ml  Net -1300 ml   Filed Weights   02/17/20 0412 02/19/20 0424 02/20/20 0426  Weight: 122.2 kg 121.2 kg 121.3 kg    Data Reviewed: I have personally reviewed and interpreted daily labs, tele strips, imagings as discussed above. I reviewed all nursing notes, pharmacy notes, vitals, pertinent old records I have discussed plan of care as described above with RN and patient/family.  CBC: Recent Labs  Lab 02/18/20 0620 02/19/20 0501 02/19/20 1435 02/20/20 0120 02/20/20 1431  WBC 11.5* 10.8* 12.4* 11.9* 10.7*  HGB 8.3* 8.1* 8.6* 8.1* 7.5*  HCT 25.3* 24.8* 25.2* 24.8* 22.3*  MCV 102.0* 102.9* 99.2 102.5* 100.9*  PLT 145* 123* 117* 131* 329*   Basic Metabolic Panel: Recent Labs  Lab 02/18/2020 0048 02/16/20 0404 02/16/20 1049 02/17/20 0326 02/18/20 0620 02/19/20 0501 02/20/20 0539  NA  --  142  --  140 140 140 140  K  --  2.9*  --  3.2* 3.4* 3.5 3.4*  CL  --  98  --  95* 95* 95* 94*  CO2  --  32  --  29 30 31 28   GLUCOSE  --  99  --  107* 134* 136* 135*  BUN  --  26*  --  28* 32* 36* 39*  CREATININE  --  2.13*  --  2.31* 2.45* 2.86* 3.24*  CALCIUM  --  7.8*  --  7.9* 8.0* 7.7* 7.7*  MG 2.0  --   --  1.8 2.0 1.9  --   PHOS  --   --   3.2 3.1 2.9 3.2  --     Studies: DG Abd 1 View  Result Date: 02/20/2020 CLINICAL DATA:  Abdominal distension. EXAM: ABDOMEN - 1 VIEW COMPARISON:  02/19/2020 FINDINGS: An enteric tube is unchanged with tip projecting over the proximal aspect of the stomach and side hole in the expected region of the GE junction. Gas is again seen in loops of small and large bowel. Mild distension of small bowel loops has mildly decreased. No acute osseous abnormality is seen. IMPRESSION: Mildly decreased gaseous bowel distension favoring decreased ileus. Electronically Signed  By: Logan Bores M.D.   On: 02/20/2020 09:57    Scheduled Meds: . Chlorhexidine Gluconate Cloth  6 each Topical Daily  . diltiazem  30 mg Oral Q8H  . [START ON 2020/02/22] enoxaparin (LOVENOX) injection  40 mg Subcutaneous Q24H  . feeding supplement (NEPRO CARB STEADY)  237 mL Oral TID BM  . ferrous sulfate  325 mg Oral Q breakfast  . Gerhardt's butt cream   Topical BID  . hydrocerin  1 application Topical Daily  . insulin aspart  0-20 Units Subcutaneous TID WC  . lactulose  30 g Per Tube TID  . lactulose  300 mL Rectal BID  . multivitamin with minerals  1 tablet Oral Daily  . pantoprazole (PROTONIX) IV  40 mg Intravenous Q12H  . pravastatin  20 mg Oral q1800  . sodium chloride flush  10-40 mL Intracatheter Q12H  . sodium chloride flush  3 mL Intravenous Q12H   Continuous Infusions: . sodium chloride    . amiodarone 60 mg/hr (02/20/20 1442)  . amiodarone    . cefTRIAXone (ROCEPHIN)  IV 2 g (02/20/20 1248)  . octreotide  (SANDOSTATIN)    IV infusion 50 mcg/hr (02/20/20 1429)  . phytonadione (VITAMIN K) IV 10 mg (02/20/20 1323)   PRN Meds: sodium chloride, albuterol, diltiazem, ketoconazole, midodrine, morphine injection, ondansetron **OR** ondansetron (ZOFRAN) IV, oxyCODONE, phenol, simethicone, sodium chloride flush, sodium chloride flush, traZODone  Time spent: 35 minutes  Author: Val Riles. MD [facility] 02/20/2020 3:47 PM  To reach On-call, see care teams to locate the attending and reach out to them via [email or web address]. If 7PM-7AM, please contact night-coverage If you still have difficulty reaching the attending provider, please page the [facility] (Director on Call) for [facility] on amion for assistance.

## 2020-02-20 NOTE — Progress Notes (Signed)
[facility]  ROUNDING NOTE   Subjective:   Confused this morning. Pulling mittens off. Unable to answer questions.  NGT placed yesteray.   UOP 316m  Furosemide gtt 565mhr.    Objective:  Vital signs in last 24 hours:  Temp:  [97.4 F (36.3 C)-98.1 F (36.7 C)] 97.9 F (36.6 C) (01/24 0831) Pulse Rate:  [92-123] 92 (01/24 0426) Resp:  [18-19] 19 (01/24 0426) BP: (100-167)/(29-82) 138/55 (01/24 0554) SpO2:  [94 %-100 %] 100 % (01/24 0831) Weight:  [121.3 kg] 121.3 kg (01/24 0426)  Weight change: 0.136 kg Filed Weights   02/17/20 0412 02/19/20 0424 02/20/20 0426  Weight: 122.2 kg 121.2 kg 121.3 kg    Intake/Output: I/O last 3 completed shifts: In: 100 [IV Piggyback:100] Out: 1300 [Urine:325; Emesis/NG output:975]   Intake/Output this shift:  No intake/output data recorded.  Physical Exam: General:  No acute distress, laying in bed.   Head:  Normocephalic, atraumatic. Dry oral mucosal membranes  Eyes:  Anicteric  Neck:  Supple  Lungs:   bilateral crackles, diminished bilaterally, 2L [place]  Heart:  tachycardia  Abdomen:   +distended, abdominal wall edema  Extremities:  2+ peripheral edema.  Neurologic:  alert to self only  Skin:  Warm/dry  Access:  No hemodialysis access    Basic Metabolic Panel: Recent Labs  Lab 02/14/2020 0048 02/16/20 0404 02/16/20 1049 02/17/20 0326 02/18/20 0620 02/19/20 0501 02/20/20 0539  NA  --  142  --  140 140 140 140  K  --  2.9*  --  3.2* 3.4* 3.5 3.4*  CL  --  98  --  95* 95* 95* 94*  CO2  --  32  --  29 30 31 28   GLUCOSE  --  99  --  107* 134* 136* 135*  BUN  --  26*  --  28* 32* 36* 39*  CREATININE  --  2.13*  --  2.31* 2.45* 2.86* 3.24*  CALCIUM  --  7.8*  --  7.9* 8.0* 7.7* 7.7*  MG 2.0  --   --  1.8 2.0 1.9  --   PHOS  --   --  3.2 3.1 2.9 3.2  --     Liver Function Tests: Recent Labs  Lab 02/14/20 2214 02/17/20 0326 02/18/20 0620 02/19/20 0501  AST 52* 49* 40 45*  ALT 18 18 16 19   ALKPHOS 123  114 96 92  BILITOT 2.0* 1.8* 1.8* 2.0*  PROT 7.2 6.2* 6.4* 6.0*  ALBUMIN 2.1* 1.8* 2.6* 2.2*   No results for input(s): LIPASE, AMYLASE in the last 168 hours. Recent Labs  Lab 02/18/20 0620 02/19/20 0500 02/20/20 0539  AMMONIA 48* 72* 175*    CBC: Recent Labs  Lab 02/17/20 0326 02/18/20 0620 02/19/20 0501 02/19/20 1435 02/20/20 0120  WBC 10.6* 11.5* 10.8* 12.4* 11.9*  HGB 9.0* 8.3* 8.1* 8.6* 8.1*  HCT 26.8* 25.3* 24.8* 25.2* 24.8*  MCV 101.5* 102.0* 102.9* 99.2 102.5*  PLT 137* 145* 123* 117* 131*    Cardiac Enzymes: No results for input(s): CKTOTAL, CKMB, CKMBINDEX, TROPONINI in the last 168 hours.  BNP: Invalid input(s): POCBNP  CBG: Recent Labs  Lab 02/19/20 0813 02/19/20 1215 02/19/20 1701 02/19/20 2042 02/20/20 0720  GLUCAP 118* 140* 124* 126* 128*    Microbiology: Results for orders placed or performed during the hospital encounter of 02/07/2020  Resp Panel by RT-PCR (Flu A&B, Covid) Nasopharyngeal Swab     Status: None   Collection Time: 02/07/2020  7:56 AM  Specimen: Nasopharyngeal Swab; Nasopharyngeal(NP) swabs in vial transport medium  Result Value Ref Range Status   SARS Coronavirus 2 by RT PCR NEGATIVE NEGATIVE Final    Comment: (NOTE) SARS-CoV-2 target nucleic acids are NOT DETECTED.  The SARS-CoV-2 RNA is generally detectable in upper respiratory specimens during the acute phase of infection. The lowest concentration of SARS-CoV-2 viral copies this assay can detect is 138 copies/mL. A negative result does not preclude SARS-Cov-2 infection and should not be used as the sole basis for treatment or other patient management decisions. A negative result may occur with  improper specimen collection/handling, submission of specimen other than nasopharyngeal swab, presence of viral mutation(s) within the areas targeted by this assay, and inadequate number of viral copies(<138 copies/mL). A negative result must be combined with clinical  observations, patient history, and epidemiological information. The expected result is Negative.  Fact Sheet for Patients:  EntrepreneurPulse.com.au  Fact Sheet for Healthcare Providers:  IncredibleEmployment.be  This test is no t yet approved or cleared by the [place] FDA and  has been authorized for detection and/or diagnosis of SARS-CoV-2 by FDA under an Emergency Use Authorization (EUA). This EUA will remain  in effect (meaning this test can be used) for the duration of the COVID-19 declaration under Section 564(b)(1) of the Act, 21 U.S.C.section 360bbb-3(b)(1), unless the authorization is terminated  or revoked sooner.       Influenza A by PCR NEGATIVE NEGATIVE Final   Influenza B by PCR NEGATIVE NEGATIVE Final    Comment: (NOTE) The Xpert Xpress SARS-CoV-2/FLU/RSV plus assay is intended as an aid in the diagnosis of influenza from Nasopharyngeal swab specimens and should not be used as a sole basis for treatment. Nasal washings and aspirates are unacceptable for Xpert Xpress SARS-CoV-2/FLU/RSV testing.  Fact Sheet for Patients: EntrepreneurPulse.com.au  Fact Sheet for Healthcare Providers: IncredibleEmployment.be  This test is not yet approved or cleared by the [place] FDA and has been authorized for detection and/or diagnosis of SARS-CoV-2 by FDA under an Emergency Use Authorization (EUA). This EUA will remain in effect (meaning this test can be used) for the duration of the COVID-19 declaration under Section 564(b)(1) of the Act, 21 U.S.C. section 360bbb-3(b)(1), unless the authorization is terminated or revoked.  Performed at [facility], [address], [place], [place] [address]   Body fluid culture     Status: None   Collection Time: 02/02/2020  9:05 AM   Specimen: Peritoneal Washings; Body Fluid  Result Value Ref Range Status   Specimen Description   Final     PERITONEAL Performed at [facility], [address], [place], [place] [address]    Special Requests   Final    NONE Performed at [facility], [address] [place], [place] [address]    Gram Stain   Final    FEW WBC PRESENT, PREDOMINANTLY MONONUCLEAR NO ORGANISMS SEEN    Culture   Final    NO GROWTH Performed at [facility], [place] [address], [place], [place] [address]    Report Status 02/18/2020 FINAL  Final  Body fluid culture     Status: None   Collection Time: 02/16/20  3:35 PM   Specimen: PATH Cytology Peritoneal fluid  Result Value Ref Range Status   Specimen Description   Final    PERITONEAL Performed at [facility], [address], [place], [place] [address]    Special Requests   Final    NONE Performed at [facility], [place], [place] [address]    Gram Stain   Final    RARE WBC PRESENT, PREDOMINANTLY MONONUCLEAR NO ORGANISMS SEEN    Culture   Final    NO GROWTH 3 DAYS Performed at [facility] [address], [place], [place] [address]    Report Status 02/20/2020 FINAL  Final    Coagulation Studies: Recent Labs    02/18/20 0620 02/19/20 0501 02/20/20 0539  LABPROT 32.9* 31.9* 29.9*  INR 3.4* 3.2* 3.0*    Urinalysis: No results for input(s): COLORURINE, LABSPEC, PHURINE, GLUCOSEU, HGBUR, BILIRUBINUR, KETONESUR, PROTEINUR, UROBILINOGEN, NITRITE, LEUKOCYTESUR in the last 72 hours.  Invalid input(s): APPERANCEUR    Imaging: CT ABDOMEN PELVIS WO CONTRAST  Result Date: 02/18/2020 CLINICAL DATA:  Patient with abdominal distension. Evaluate for obstruction. EXAM: CT ABDOMEN AND PELVIS WITHOUT CONTRAST TECHNIQUE: Multidetector CT imaging of the abdomen and pelvis was performed following the standard protocol without IV contrast. COMPARISON:  CT abdomen pelvis 02/24/2020. FINDINGS: Lower chest: Heart is enlarged. Aortic vascular calcifications. Moderate left and small right pleural  effusions. Patchy ground-glass and consolidative opacities within the right upper lobe. Dependent consolidative opacities within the lower lobes bilaterally. Hepatobiliary: The liver is small and nodular in contour most compatible with cirrhosis. No intrahepatic or extrahepatic biliary ductal dilatation. Pancreas: Unremarkable Spleen: Unremarkable Adrenals/Urinary Tract: Normal adrenal glands. Kidneys are symmetric in size. Two small stones inferior pole left kidney. Urinary bladder is decompressed with a Foley catheter. Stomach/Bowel: There are prominent and distended loops of small bowel within the left hemiabdomen measuring up to 4.4 cm (image 50; series 3). There is gradual transition to more decompressed small bowel noted distally. Normal morphology of the stomach. Vascular/Lymphatic: Normal caliber abdominal aorta. Peripheral calcified atherosclerotic plaque. No retroperitoneal lymphadenopathy. Multiple mesenteric varices. Reproductive: Heterogeneous prostate. Other: Small bilateral fat containing inguinal hernias. Soft tissue anasarca. Large volume ascites. No free intraperitoneal air. Musculoskeletal: 2 degenerative changes.  No acute process. IMPRESSION: 1. There are prominent and distended loops of small bowel within the left hemiabdomen measuring up to 4.4 cm. There is gradual transition to more decompressed small bowel distally. Findings are nonspecific however may represent ileus or early/partial small bowel obstruction. 2. Findings compatible with cirrhosis and portal venous hypertension including large volume ascites. 3. Moderate left and small right pleural effusions. 4. Patchy ground-glass and consolidative opacities within the right upper lobe and bilateral lower lobes which may represent infection or edema. Recommend follow-up chest CT in 4-6 weeks after resolution of acute symptomatology to ensure resolution. 5. Nonobstructing left nephrolithiasis. 6. Aortic atherosclerosis. Electronically Signed    By: Lovey Newcomer M.D.   On: 02/18/2020 12:28   DG Abd 1 View  Result Date: 02/20/2020 CLINICAL DATA:  Abdominal distension. EXAM: ABDOMEN - 1 VIEW COMPARISON:  02/19/2020 FINDINGS: An enteric tube is unchanged with tip projecting over the proximal aspect of the stomach and side hole in the expected region of the GE junction. Gas is again seen in loops of small and large bowel. Mild distension of small bowel loops has mildly decreased. No acute osseous abnormality is seen. IMPRESSION: Mildly decreased gaseous bowel distension favoring decreased ileus. Electronically Signed   By: Logan Bores M.D.   On: 02/20/2020 09:57   DG Abd 1 View  Result Date: 02/19/2020 CLINICAL DATA:  In patient encounter for small bowel obstruction EXAM: ABDOMEN - 1 VIEW COMPARISON:  Abdomen and pelvis CT from yesterday FINDINGS: Stable degree of small and large bowel gas  with small bowel loops measuring up to 4.4 cm. There is an enteric tube with tip at the stomach and side port near the GE junction. IMPRESSION: 1. Unchanged degree of gaseous bowel distension. No change in the pattern to suggest evolving small-bowel obstruction, favor ileus. 2. Enteric tube with tip over the stomach and side port near the GE junction. Electronically Signed   By: Monte Fantasia M.D.   On: 02/19/2020 10:04   DG Chest Port 1 View  Result Date: 02/18/2020 CLINICAL DATA:  NG tube placement. EXAM: PORTABLE CHEST 1 VIEW COMPARISON:  02/18/2020 FINDINGS: An enteric tube is present with tip in the left upper quadrant consistent with location in the proximal stomach. The proximal side hole projects at or just below the level of the EG junction. Right PICC line with tip over the low SVC region. Cardiac pacemaker. Cardiac enlargement with mild central vascular congestion. Hazy perihilar appearance may represent edema or pneumonia. No definite pleural effusion. No pneumothorax. IMPRESSION: Enteric tube tip is in the left upper quadrant consistent with  location in the proximal stomach. Cardiac enlargement with pulmonary vascular congestion and perihilar edema/infiltrates. Electronically Signed   By: Lucienne Capers M.D.   On: 02/18/2020 20:06   DG Chest Port 1 View  Result Date: 02/18/2020 CLINICAL DATA:  70 year old male status post central line placement. EXAM: PORTABLE CHEST 1 VIEW COMPARISON:  Chest radiograph dated 02/14/2020. FINDINGS: Interval placement of a right-sided PICC with tip over central SVC close to the cavoatrial junction. Enteric tube extends below the diaphragm with tip beyond the inferior margin of the image. Shallow inspiration. There is cardiomegaly with vascular congestion and mild edema. No large pleural effusion or pneumothorax. Atherosclerotic calcification of the aorta. Left pectoral pacemaker device. IMPRESSION: Interval placement of a right-sided PICC with tip over central SVC. No pneumothorax. Electronically Signed   By: Anner Crete M.D.   On: 02/18/2020 19:13   [place] EKG SITE RITE  Result Date: 02/18/2020 If [facility] image not attached, placement could not be confirmed due to current cardiac rhythm.    Medications:   . sodium chloride    . amiodarone     Followed by  . amiodarone    . cefTRIAXone (ROCEPHIN)  IV 2 g (02/19/20 1002)  . octreotide  (SANDOSTATIN)    IV infusion    . phytonadione (VITAMIN K) IV     . amiodarone  150 mg Intravenous Once  . Chlorhexidine Gluconate Cloth  6 each Topical Daily  . diltiazem  30 mg Oral Q8H  . enoxaparin (LOVENOX) injection  0.5 mg/kg Subcutaneous Q24H  . feeding supplement (NEPRO CARB STEADY)  237 mL Oral TID BM  . ferrous sulfate  325 mg Oral Q breakfast  . Gerhardt's butt cream   Topical BID  . hydrocerin  1 application Topical Daily  . insulin aspart  0-20 Units Subcutaneous TID WC  . lactulose  30 g Per Tube TID  . lactulose  300 mL Rectal BID  . multivitamin with minerals  1 tablet Oral Daily  . pantoprazole (PROTONIX) IV  40 mg Intravenous Q12H  .  pravastatin  20 mg Oral q1800  . sodium chloride flush  10-40 mL Intracatheter Q12H  . sodium chloride flush  3 mL Intravenous Q12H   sodium chloride, albuterol, diltiazem, ketoconazole, midodrine, morphine injection, ondansetron **OR** ondansetron (ZOFRAN) IV, oxyCODONE, phenol, simethicone, sodium chloride flush, sodium chloride flush, traZODone  Assessment/ Plan:   Mr. Christian Berg is a 70 y.o. white  male with diabetes mellitus type 2, hypertension, BPH, atrial fibrillation, hepatic cirrhosis, atrial fibrillation, congestive heart failure, peripheral vascular disease, TIA, pacemaker who is admitted to [facility] on 01/29/2020 for Hepatic encephalopathy ([place]) [K72.90] Confusion [R41.0] Other ascites [R18.8] AMS (altered mental status) [R41.82]  1.  Acute kidney injury on chronic kidney disease stage IIIb baseline creatinine 1.6, EGFR of 42 on 01/30/2020 History of hematuria. No contrast exposure.  Acute kidney injury secondary to acute hepatorenal syndrome, prerenal azotemia from small bowel obstruction.  Responding diuretics and IV albumin - discontinue furosemide gtt.   2.  Anemia of chronic kidney disease.  Hemoglobin 8.1, macrocytic. With thrombocytopenia.  Has not gotten ESA this admission.   3.  Hepatic cirrhosis with anasarca: status post IV albumin. Large volume paracentesis on 1/20 with 9.9 liters removed.  - Pending hepatitis viral screen.   Overall prognosis is poor. MELD score calculated today is 31.    LOS: 5 Shareta Fishbaugh 1/24/202211:10 AM

## 2020-02-20 NOTE — Progress Notes (Signed)
NG tube clamped as ordered/  S/p 3hr residual check less than 22ms/ MD aware/ ordered to keep clamped but leave NG in place for meds and possible tube feed/ will monitor.

## 2020-02-20 NOTE — Progress Notes (Signed)
Pt has been changed to comfort care/ orders to keep NG tube in place/ son at bedside/ will monitor close

## 2020-02-21 DIAGNOSIS — K7469 Other cirrhosis of liver: Secondary | ICD-10-CM

## 2020-02-21 DIAGNOSIS — K7290 Hepatic failure, unspecified without coma: Secondary | ICD-10-CM | POA: Diagnosis not present

## 2020-02-21 DIAGNOSIS — Z515 Encounter for palliative care: Secondary | ICD-10-CM | POA: Diagnosis not present

## 2020-02-21 DIAGNOSIS — I4891 Unspecified atrial fibrillation: Secondary | ICD-10-CM | POA: Diagnosis not present

## 2020-02-21 MED ORDER — SODIUM CHLORIDE 0.9 % IV SOLN
1.0000 mg/h | INTRAVENOUS | Status: DC
  Administered 2020-02-21: 1 mg/h via INTRAVENOUS
  Administered 2020-02-21: 2 mg/h via INTRAVENOUS
  Filled 2020-02-21: qty 2.5

## 2020-02-21 MED ORDER — ARTIFICIAL TEARS OPHTHALMIC OINT
TOPICAL_OINTMENT | OPHTHALMIC | Status: DC | PRN
  Filled 2020-02-21: qty 3.5

## 2020-02-21 MED ORDER — ATROPINE SULFATE 1 % OP SOLN
2.0000 [drp] | Freq: Four times a day (QID) | OPHTHALMIC | Status: DC | PRN
  Administered 2020-02-21: 2 [drp] via SUBLINGUAL
  Filled 2020-02-21 (×2): qty 2

## 2020-02-28 NOTE — Progress Notes (Signed)
PROGRESS NOTE    Christian Berg  ZRA:[id number] DOB: 1950/12/25 DOA: 02/07/2020 PCP: Birdie Sons, MD    Assessment & Plan:   Principal Problem:   Hepatic encephalopathy ([place]) Active Problems:   (HFpEF) heart failure with preserved ejection fraction (HCC)   Atrial fibrillation with RVR (HCC)   NASH (nonalcoholic steatohepatitis)   CKD stage 3 due to type 2 diabetes mellitus ([place])   Obesity   Hypokalemia   Other ascites   Ileus ([place])   Palliative care by specialist   Terminal care   Ileus or early/partial SBO: NPO. Comfort care only   Rectal bleeding: developed on 02/19/20. Comfort care only   Hepatic encephalopathy: continue on lactulose enema. Comfort care only   Coagulopathy: w/ elevated INR secondary to liver failure. S/p vitamin K x 3 doses  Cirrhosis: w/ ascites & anasarca.  S/p paracentesis of 9.9 L & s/p IV albumin.   AKI on CKDIIIb: Cr is trending up from day prior.   Hx of chronic diastolic CHF: TEE showed EF 54-56%, diastolic function indeterminate & no wall motion abnormality. Continue on to hold home dose of torsemide, metolazone.  Chronic a. Fib: comfort care only   DM2: poorly controlled. Comfort care only   Hypokalemia: no labs as pt is comfort care only   Morbid obesity: BMI 40.6. Complicates care and overall prognosis        DVT prophylaxis: none  Code Status: DNR Family Communication: discussed pt's care w/ pt's wife, [name] and answered her questions Disposition Plan: likely in hospital death. Continue w/ comfort care  Status is: Inpatient  Remains inpatient appropriate because:IV treatments appropriate due to intensity of illness or inability to take PO and Inpatient level of care appropriate due to severity of illness   Dispo: The patient is from: Home              Anticipated d/c is to: likely in hospital death, continue w/ comfort care              Anticipated d/c date is: 1 day              Patient currently is not  medically stable to d/c.   Difficult to place patient n/a     Consultants:   Palliative care/hospice  nephro    Procedures:  Antimicrobials:  Subjective: Pt is obtunded   Objective: Vitals:   02/20/20 1428 02/20/20 1628 02/20/20 2019 03/06/2020 0801  BP: (!) 96/57 (!) 103/49 (!) 90/54 (!) 80/52  Pulse: (!) 122 (!) 122 100 (!) 135  Resp:   19 (!) 22  Temp:    97.8 F (36.6 C)  TempSrc:    Axillary  SpO2:   96% 95%  Weight:      Height:        Intake/Output Summary (Last 24 hours) at 03/06/20 0943 Last data filed at 02/20/2020 2019 Gross per 24 hour  Intake 0 ml  Output 252 ml  Net -252 ml   Filed Weights   02/17/20 0412 02/19/20 0424 02/20/20 0426  Weight: 122.2 kg 121.2 kg 121.3 kg    Examination:  General exam: Obtunded  Respiratory system: course breath sounds  Cardiovascular system: S1 & S2 +. No rubs, gallops or clicks. B/l LE edema Gastrointestinal system: Abdomen is obese and nontender & hypoactive bowel sounds Central nervous system: obtunded Psychiatry: Judgement and insight appear abnormal. .     Data Reviewed: I have personally reviewed following labs and imaging studies  CBC:  Recent Labs  Lab 02/18/20 0620 02/19/20 0501 02/19/20 1435 02/20/20 0120 02/20/20 1431  WBC 11.5* 10.8* 12.4* 11.9* 10.7*  HGB 8.3* 8.1* 8.6* 8.1* 7.5*  HCT 25.3* 24.8* 25.2* 24.8* 22.3*  MCV 102.0* 102.9* 99.2 102.5* 100.9*  PLT 145* 123* 117* 131* 371*   Basic Metabolic Panel: Recent Labs  Lab 02/19/2020 0048 02/16/20 0404 02/16/20 1049 02/17/20 0326 02/18/20 0620 02/19/20 0501 02/20/20 0539  NA  --  142  --  140 140 140 140  K  --  2.9*  --  3.2* 3.4* 3.5 3.4*  CL  --  98  --  95* 95* 95* 94*  CO2  --  32  --  29 30 31 28   GLUCOSE  --  99  --  107* 134* 136* 135*  BUN  --  26*  --  28* 32* 36* 39*  CREATININE  --  2.13*  --  2.31* 2.45* 2.86* 3.24*  CALCIUM  --  7.8*  --  7.9* 8.0* 7.7* 7.7*  MG 2.0  --   --  1.8 2.0 1.9  --   PHOS  --   --   3.2 3.1 2.9 3.2  --    GFR: Estimated Creatinine Clearance: 26.5 mL/min (A) (by C-G formula based on SCr of 3.24 mg/dL (H)). Liver Function Tests: Recent Labs  Lab 02/14/20 2214 02/17/20 0326 02/18/20 0620 02/19/20 0501  AST 52* 49* 40 45*  ALT 18 18 16 19   ALKPHOS 123 114 96 92  BILITOT 2.0* 1.8* 1.8* 2.0*  PROT 7.2 6.2* 6.4* 6.0*  ALBUMIN 2.1* 1.8* 2.6* 2.2*   No results for input(s): LIPASE, AMYLASE in the last 168 hours. Recent Labs  Lab 02/12/2020 0710 02/17/20 0326 02/18/20 0620 02/19/20 0500 02/20/20 0539  AMMONIA 49* 34 48* 72* 175*   Coagulation Profile: Recent Labs  Lab 02/16/20 1049 02/17/20 0326 02/18/20 0620 02/19/20 0501 02/20/20 0539  INR 3.2* 3.2* 3.4* 3.2* 3.0*   Cardiac Enzymes: No results for input(s): CKTOTAL, CKMB, CKMBINDEX, TROPONINI in the last 168 hours. BNP (last 3 results) No results for input(s): PROBNP in the last 8760 hours. HbA1C: No results for input(s): HGBA1C in the last 72 hours. CBG: Recent Labs  Lab 02/19/20 1701 02/19/20 2042 02/20/20 0720 02/20/20 1125 02/20/20 1648  GLUCAP 124* 126* 128* 120* 140*   Lipid Profile: No results for input(s): CHOL, HDL, LDLCALC, TRIG, CHOLHDL, LDLDIRECT in the last 72 hours. Thyroid Function Tests: No results for input(s): TSH, T4TOTAL, FREET4, T3FREE, THYROIDAB in the last 72 hours. Anemia Panel: No results for input(s): VITAMINB12, FOLATE, FERRITIN, TIBC, IRON, RETICCTPCT in the last 72 hours. Sepsis Labs: No results for input(s): PROCALCITON, LATICACIDVEN in the last 168 hours.  Recent Results (from the past 240 hour(s))  Resp Panel by RT-PCR (Flu A&B, Covid) Nasopharyngeal Swab     Status: None   Collection Time: 02/10/2020  7:56 AM   Specimen: Nasopharyngeal Swab; Nasopharyngeal(NP) swabs in vial transport medium  Result Value Ref Range Status   SARS Coronavirus 2 by RT PCR NEGATIVE NEGATIVE Final    Comment: (NOTE) SARS-CoV-2 target nucleic acids are NOT DETECTED.  The  SARS-CoV-2 RNA is generally detectable in upper respiratory specimens during the acute phase of infection. The lowest concentration of SARS-CoV-2 viral copies this assay can detect is 138 copies/mL. A negative result does not preclude SARS-Cov-2 infection and should not be used as the sole basis for treatment or other patient management decisions. A negative result may occur with  improper specimen collection/handling, submission of specimen other than nasopharyngeal swab, presence of viral mutation(s) within the areas targeted by this assay, and inadequate number of viral copies(<138 copies/mL). A negative result must be combined with clinical observations, patient history, and epidemiological information. The expected result is Negative.  Fact Sheet for Patients:  EntrepreneurPulse.com.au  Fact Sheet for Healthcare Providers:  IncredibleEmployment.be  This test is no t yet approved or cleared by the [place] FDA and  has been authorized for detection and/or diagnosis of SARS-CoV-2 by FDA under an Emergency Use Authorization (EUA). This EUA will remain  in effect (meaning this test can be used) for the duration of the COVID-19 declaration under Section 564(b)(1) of the Act, 21 U.S.C.section 360bbb-3(b)(1), unless the authorization is terminated  or revoked sooner.       Influenza A by PCR NEGATIVE NEGATIVE Final   Influenza B by PCR NEGATIVE NEGATIVE Final    Comment: (NOTE) The Xpert Xpress SARS-CoV-2/FLU/RSV plus assay is intended as an aid in the diagnosis of influenza from Nasopharyngeal swab specimens and should not be used as a sole basis for treatment. Nasal washings and aspirates are unacceptable for Xpert Xpress SARS-CoV-2/FLU/RSV testing.  Fact Sheet for Patients: EntrepreneurPulse.com.au  Fact Sheet for Healthcare Providers: IncredibleEmployment.be  This test is not yet approved or  cleared by the [place] FDA and has been authorized for detection and/or diagnosis of SARS-CoV-2 by FDA under an Emergency Use Authorization (EUA). This EUA will remain in effect (meaning this test can be used) for the duration of the COVID-19 declaration under Section 564(b)(1) of the Act, 21 U.S.C. section 360bbb-3(b)(1), unless the authorization is terminated or revoked.  Performed at [facility], [address] [place], [place] [address]   Body fluid culture     Status: None   Collection Time: 02/27/2020  9:05 AM   Specimen: Peritoneal Washings; Body Fluid  Result Value Ref Range Status   Specimen Description   Final    PERITONEAL Performed at [facility], [address], [place], [place] [address]    Special Requests   Final    NONE Performed at [facility], [address] [place], [place] [address]    Gram Stain   Final    FEW WBC PRESENT, PREDOMINANTLY MONONUCLEAR NO ORGANISMS SEEN    Culture   Final    NO GROWTH Performed at [facility], [place] [address] [place], [place] [address]    Report Status 02/18/2020 FINAL  Final  Body fluid culture     Status: None   Collection Time: 02/16/20  3:35 PM   Specimen: PATH Cytology Peritoneal fluid  Result Value Ref Range Status   Specimen Description   Final    PERITONEAL Performed at [facility] [address] [place], [place] [address]    Special Requests   Final    NONE Performed at [facility], [address] [place],  [address]    Gram Stain   Final    RARE WBC PRESENT, PREDOMINANTLY MONONUCLEAR NO ORGANISMS SEEN    Culture   Final    NO GROWTH 3 DAYS Performed at [facility], [place] [address], [place],  [address]    Report Status 02/20/2020 FINAL  Final         Radiology Studies: DG Abd 1 View  Result Date: 02/20/2020 CLINICAL DATA:  Check gastric catheter placement EXAM: ABDOMEN - 1 VIEW COMPARISON:  Film from  earlier in  the same day. FINDINGS: Gastric catheter is been advanced further into the stomach. The stomach is partially distended with air. Colonic gas is noted as well. IMPRESSION: Gastric catheter within the stomach. Electronically Signed   By: Inez Catalina M.D.   On: 02/20/2020 17:04   DG Abd 1 View  Result Date: 02/20/2020 CLINICAL DATA:  Abdominal distension. EXAM: ABDOMEN - 1 VIEW COMPARISON:  02/19/2020 FINDINGS: An enteric tube is unchanged with tip projecting over the proximal aspect of the stomach and side hole in the expected region of the GE junction. Gas is again seen in loops of small and large bowel. Mild distension of small bowel loops has mildly decreased. No acute osseous abnormality is seen. IMPRESSION: Mildly decreased gaseous bowel distension favoring decreased ileus. Electronically Signed   By: Logan Bores M.D.   On: 02/20/2020 09:57   DG Abd 1 View  Result Date: 02/19/2020 CLINICAL DATA:  In patient encounter for small bowel obstruction EXAM: ABDOMEN - 1 VIEW COMPARISON:  Abdomen and pelvis CT from yesterday FINDINGS: Stable degree of small and large bowel gas with small bowel loops measuring up to 4.4 cm. There is an enteric tube with tip at the stomach and side port near the GE junction. IMPRESSION: 1. Unchanged degree of gaseous bowel distension. No change in the pattern to suggest evolving small-bowel obstruction, favor ileus. 2. Enteric tube with tip over the stomach and side port near the GE junction. Electronically Signed   By: Monte Fantasia M.D.   On: 02/19/2020 10:04        Scheduled Meds: . Gerhardt's butt cream   Topical BID  . hydrocerin  1 application Topical Daily  . pantoprazole (PROTONIX) IV  40 mg Intravenous Q12H  . sodium chloride flush  10-40 mL Intracatheter Q12H  . sodium chloride flush  3 mL Intravenous Q12H   Continuous Infusions: . sodium chloride       LOS: 6 days    Time spent: 20 mins     Wyvonnia Dusky, MD [facility] Pager [phone]  If 7PM-7AM, please contact night-coverage 10-Mar-2020, 9:43 AM

## 2020-02-28 NOTE — Progress Notes (Signed)
Patient was started on hydromorphone drip at 69m/hr.

## 2020-02-28 NOTE — Progress Notes (Signed)
[facility]  ROUNDING NOTE   Subjective:   Patient and family met with palliative care yesterday. Family has decided to proceed with comfort measures.    Objective:  Vital signs in last 24 hours:  Temp:  [97.8 F (36.6 C)] 97.8 F (36.6 C) (01/25 0801) Pulse Rate:  [100-135] 135 (01/25 0801) Resp:  [19-22] 22 (01/25 0801) BP: (80-103)/(49-71) 80/52 (01/25 0801) SpO2:  [95 %-96 %] 95 % (01/25 0801)  Weight change:  Filed Weights   02/17/20 0412 02/19/20 0424 02/20/20 0426  Weight: 122.2 kg 121.2 kg 121.3 kg    Intake/Output: I/O last 3 completed shifts: In: 0  Out: 1552 [Urine:550; Emesis/NG output:1002]   Intake/Output this shift:  No intake/output data recorded.  Physical Exam: General:  No acute distress, laying in bed.   Head:  Normocephalic, atraumatic. Dry oral mucosal membranes  Eyes:  Anicteric  Neck:  Supple  Lungs:   bilateral crackles, diminished bilaterally, 2L [place]  Heart:  tachycardia  Abdomen:   +distended, abdominal wall edema  Extremities:  2+ peripheral edema.  Neurologic:  alert to self only  Skin:  Warm/dry  Access:  No hemodialysis access    Basic Metabolic Panel: Recent Labs  Lab 02/27/2020 0048 02/16/20 0404 02/16/20 1049 02/17/20 0326 02/18/20 0620 02/19/20 0501 02/20/20 0539  NA  --  142  --  140 140 140 140  K  --  2.9*  --  3.2* 3.4* 3.5 3.4*  CL  --  98  --  95* 95* 95* 94*  CO2  --  32  --  29 30 31 28   GLUCOSE  --  99  --  107* 134* 136* 135*  BUN  --  26*  --  28* 32* 36* 39*  CREATININE  --  2.13*  --  2.31* 2.45* 2.86* 3.24*  CALCIUM  --  7.8*  --  7.9* 8.0* 7.7* 7.7*  MG 2.0  --   --  1.8 2.0 1.9  --   PHOS  --   --  3.2 3.1 2.9 3.2  --     Liver Function Tests: Recent Labs  Lab 02/14/20 2214 02/17/20 0326 02/18/20 0620 02/19/20 0501  AST 52* 49* 40 45*  ALT 18 18 16 19   ALKPHOS 123 114 96 92  BILITOT 2.0* 1.8* 1.8* 2.0*  PROT 7.2 6.2* 6.4* 6.0*  ALBUMIN 2.1* 1.8* 2.6* 2.2*   No results for  input(s): LIPASE, AMYLASE in the last 168 hours. Recent Labs  Lab 02/18/20 0620 02/19/20 0500 02/20/20 0539  AMMONIA 48* 72* 175*    CBC: Recent Labs  Lab 02/18/20 0620 02/19/20 0501 02/19/20 1435 02/20/20 0120 02/20/20 1431  WBC 11.5* 10.8* 12.4* 11.9* 10.7*  HGB 8.3* 8.1* 8.6* 8.1* 7.5*  HCT 25.3* 24.8* 25.2* 24.8* 22.3*  MCV 102.0* 102.9* 99.2 102.5* 100.9*  PLT 145* 123* 117* 131* 116*    Cardiac Enzymes: No results for input(s): CKTOTAL, CKMB, CKMBINDEX, TROPONINI in the last 168 hours.  BNP: Invalid input(s): POCBNP  CBG: Recent Labs  Lab 02/19/20 1701 02/19/20 2042 02/20/20 0720 02/20/20 1125 02/20/20 1648  GLUCAP 124* 126* 128* 120* 140*    Microbiology: Results for orders placed or performed during the hospital encounter of 02/07/2020  Resp Panel by RT-PCR (Flu A&B, Covid) Nasopharyngeal Swab     Status: None   Collection Time: 02/05/2020  7:56 AM   Specimen: Nasopharyngeal Swab; Nasopharyngeal(NP) swabs in vial transport medium  Result Value Ref Range Status  SARS Coronavirus 2 by RT PCR NEGATIVE NEGATIVE Final    Comment: (NOTE) SARS-CoV-2 target nucleic acids are NOT DETECTED.  The SARS-CoV-2 RNA is generally detectable in upper respiratory specimens during the acute phase of infection. The lowest concentration of SARS-CoV-2 viral copies this assay can detect is 138 copies/mL. A negative result does not preclude SARS-Cov-2 infection and should not be used as the sole basis for treatment or other patient management decisions. A negative result may occur with  improper specimen collection/handling, submission of specimen other than nasopharyngeal swab, presence of viral mutation(s) within the areas targeted by this assay, and inadequate number of viral copies(<138 copies/mL). A negative result must be combined with clinical observations, patient history, and epidemiological information. The expected result is Negative.  Fact Sheet for Patients:   EntrepreneurPulse.com.au  Fact Sheet for Healthcare Providers:  IncredibleEmployment.be  This test is no t yet approved or cleared by the [place] FDA and  has been authorized for detection and/or diagnosis of SARS-CoV-2 by FDA under an Emergency Use Authorization (EUA). This EUA will remain  in effect (meaning this test can be used) for the duration of the COVID-19 declaration under Section 564(b)(1) of the Act, 21 U.S.C.section 360bbb-3(b)(1), unless the authorization is terminated  or revoked sooner.       Influenza A by PCR NEGATIVE NEGATIVE Final   Influenza B by PCR NEGATIVE NEGATIVE Final    Comment: (NOTE) The Xpert Xpress SARS-CoV-2/FLU/RSV plus assay is intended as an aid in the diagnosis of influenza from Nasopharyngeal swab specimens and should not be used as a sole basis for treatment. Nasal washings and aspirates are unacceptable for Xpert Xpress SARS-CoV-2/FLU/RSV testing.  Fact Sheet for Patients: EntrepreneurPulse.com.au  Fact Sheet for Healthcare Providers: IncredibleEmployment.be  This test is not yet approved or cleared by the [place] FDA and has been authorized for detection and/or diagnosis of SARS-CoV-2 by FDA under an Emergency Use Authorization (EUA). This EUA will remain in effect (meaning this test can be used) for the duration of the COVID-19 declaration under Section 564(b)(1) of the Act, 21 U.S.C. section 360bbb-3(b)(1), unless the authorization is terminated or revoked.  Performed at [facility], [address] [place], [place] [address]   Body fluid culture     Status: None   Collection Time: 01/31/2020  9:05 AM   Specimen: Peritoneal Washings; Body Fluid  Result Value Ref Range Status   Specimen Description   Final    PERITONEAL Performed at [facility], [address], [place], [place] [address]    Special Requests   Final     NONE Performed at [facility], [address] [place], [place] [address]    Gram Stain   Final    FEW WBC PRESENT, PREDOMINANTLY MONONUCLEAR NO ORGANISMS SEEN    Culture   Final    NO GROWTH Performed at [facility], [place] [address], [place], [place] [address]    Report Status 02/18/2020 FINAL  Final  Body fluid culture     Status: None   Collection Time: 02/16/20  3:35 PM   Specimen: PATH Cytology Peritoneal fluid  Result Value Ref Range Status   Specimen Description   Final    PERITONEAL Performed at [facility], [address], [place],  [address]    Special Requests   Final    NONE Performed at [facility], [address] [place],  [address]    Gram Stain   Final  RARE WBC PRESENT, PREDOMINANTLY MONONUCLEAR NO ORGANISMS SEEN    Culture   Final    NO GROWTH 3 DAYS Performed at [facility], [place] [address] [place], [place] [address]    Report Status 02/20/2020 FINAL  Final    Coagulation Studies: Recent Labs    02/19/20 0501 02/20/20 0539  LABPROT 31.9* 29.9*  INR 3.2* 3.0*    Urinalysis: No results for input(s): COLORURINE, LABSPEC, PHURINE, GLUCOSEU, HGBUR, BILIRUBINUR, KETONESUR, PROTEINUR, UROBILINOGEN, NITRITE, LEUKOCYTESUR in the last 72 hours.  Invalid input(s): APPERANCEUR    Imaging: DG Abd 1 View  Result Date: 02/20/2020 CLINICAL DATA:  Check gastric catheter placement EXAM: ABDOMEN - 1 VIEW COMPARISON:  Film from earlier in the same day. FINDINGS: Gastric catheter is been advanced further into the stomach. The stomach is partially distended with air. Colonic gas is noted as well. IMPRESSION: Gastric catheter within the stomach. Electronically Signed   By: Inez Catalina M.D.   On: 02/20/2020 17:04   DG Abd 1 View  Result Date: 02/20/2020 CLINICAL DATA:  Abdominal distension. EXAM: ABDOMEN - 1 VIEW COMPARISON:  02/19/2020 FINDINGS: An enteric tube is unchanged with tip projecting over  the proximal aspect of the stomach and side hole in the expected region of the GE junction. Gas is again seen in loops of small and large bowel. Mild distension of small bowel loops has mildly decreased. No acute osseous abnormality is seen. IMPRESSION: Mildly decreased gaseous bowel distension favoring decreased ileus. Electronically Signed   By: Logan Bores M.D.   On: 02/20/2020 09:57   DG Abd 1 View  Result Date: 02/19/2020 CLINICAL DATA:  In patient encounter for small bowel obstruction EXAM: ABDOMEN - 1 VIEW COMPARISON:  Abdomen and pelvis CT from yesterday FINDINGS: Stable degree of small and large bowel gas with small bowel loops measuring up to 4.4 cm. There is an enteric tube with tip at the stomach and side port near the GE junction. IMPRESSION: 1. Unchanged degree of gaseous bowel distension. No change in the pattern to suggest evolving small-bowel obstruction, favor ileus. 2. Enteric tube with tip over the stomach and side port near the GE junction. Electronically Signed   By: Monte Fantasia M.D.   On: 02/19/2020 10:04     Medications:   . sodium chloride     . Gerhardt's butt cream   Topical BID  . hydrocerin  1 application Topical Daily  . pantoprazole (PROTONIX) IV  40 mg Intravenous Q12H  . sodium chloride flush  10-40 mL Intracatheter Q12H  . sodium chloride flush  3 mL Intravenous Q12H   sodium chloride, albuterol, diltiazem, glycopyrrolate, HYDROmorphone (DILAUDID) injection, ketoconazole, LORazepam, ondansetron **OR** ondansetron (ZOFRAN) IV, oxyCODONE, phenol, simethicone, sodium chloride flush, sodium chloride flush, traZODone  Assessment/ Plan:   Christian Berg is a 70 y.o. white male with diabetes mellitus type 2, hypertension, BPH, atrial fibrillation, hepatic cirrhosis, atrial fibrillation, congestive heart failure, peripheral vascular disease, TIA, pacemaker who is admitted to [facility] on 02/26/2020 for Hepatic encephalopathy ([place]) [K72.90] Confusion [R41.0] Other  ascites [R18.8] AMS (altered mental status) [R41.82]  1.  Acute kidney injury on chronic kidney disease stage IIIb baseline creatinine 1.6, EGFR of 42 on 01/30/2020 History of hematuria. No contrast exposure.  Acute kidney injury secondary to acute hepatorenal syndrome, prerenal azotemia from small bowel obstruction.   2.  Anemia of chronic kidney disease.  with thrombocytopenia.  Has not gotten ESA this admission.   3.  Hepatic cirrhosis with  anasarca: status post IV albumin. Large volume paracentesis on 1/20 with 9.9 liters removed.   Overall prognosis is poor. MELD score calculated on Mar 09, 2020 was 31.    LOS: 6 Teon Hudnall Feb 11, 20229:13 AM

## 2020-02-28 NOTE — Death Summary Note (Signed)
Death Summary  Christian Berg MCE:[id number] DOB: Jun 05, 1950 DOA: 02/27/20  PCP: Birdie Sons, MD PCP/Office notified:   Admit date: Feb 27, 2020 Date of Death: Mar 04, 2020  Final Diagnoses:  Principal Problem:   Hepatic encephalopathy ([place]) Active Problems:   (HFpEF) heart failure with preserved ejection fraction (HCC)   Atrial fibrillation with RVR (HCC)   NASH (nonalcoholic steatohepatitis)   CKD stage 3 due to type 2 diabetes mellitus ([place])   Obesity   Hypokalemia   Other ascites   Ileus ([place])   Palliative care by specialist   Terminal care     History of present illness:  HPI was taken from Dr.Agbata: Christian Berg is a 70 y.o. male with medical history significant for morbid obesity (bmi 43), history of liver cirrhosis, diabetes mellitus, A. fib who was brought into the emergency room by EMS for evaluation of mental status changes.  His wife states that over the last couple of days he has had very poor oral intake and has been weak and tired.  She also notes that over the last 24 hours he has been very forgetful and increasingly confused.  Patient is currently oriented to person and place but not to time which is unusual for him.  His wife is also concerned that his urine output has decreased over the last 24 to 48 hours and he has not had a bowel movement in 4 days.  She also notes generalized body swelling despite taking his diuretics. He denies having any fever or chills, no abdominal pain, no dysuria, no frequency, no diarrhea, no headache, no dizziness, no lightheadedness, no chest pain or shortness of breath, no cough, no nausea, no vomiting, no dizziness or lightheadedness. Labs show sodium 141, potassium 2.9, chloride 94, bicarb 31, glucose 103, BUN 26, creatinine 2.15 above the baseline of 1.6, calcium 8.4, albumin 2.1, AST 52, ALT 18, total protein 7.2, ammonia 49, total bilirubin 2.0, BNP 781, troponin 24, white count 6.5, hemoglobin 9.9, hematocrit 30.1, MCV 103,  RDW 21, platelet count 188 Respiratory viral panel is negative Peritoneal fluid analysis reveals 116 nucleated cells CT scan of abdomen and pelvis shows cirrhosis with large volume ascites and anasarca. Small pleural effusions, greater on the left. Chest x-ray reviewed by me shows CHF Twelve-lead EKG reviewed by me shows A. fib with a rapid ventricular rate   ED Course: Patient is a 70 year old Caucasian male who was brought to the ER by EMS for evaluation of confusion and poor oral intake.  Patient noted to have worsening of his renal function from baseline (1.6 >> 2.15), his ammonia level is elevated and he has not had a bowel movement in about 4 days.  He will be admitted to the hospital for further evaluation.   As per Dr. Dwyane Dee: No significant overnight issues, patient remained encephalopathic, very confused today due to high ammonia level.  Was getting lactulose enema but it was not holding so maybe it was not effective. Patient is very confused, AAO x1 and very lethargic Overall patient's condition is very critical controlled at any time. Palliative care with patient's wife about goals of care  Hospital Course from Dr. Jimmye Norman 03-04-20: By the time I saw the pt was already made comfort care. Pt was obtunded and unresponsive. Pt unfortunately passed away at 1734 on 03-04-2020. For more information please see previous progress/consult notes.     Ileus or early/partial SBO: NPO. Comfort care only   Rectal bleeding: developed on 02/19/20. Comfort care only  Hepatic encephalopathy: continue on lactulose enema. Comfort care only   Coagulopathy: w/ elevated INR secondary to liver failure. S/p vitamin K x 3 doses  Cirrhosis: w/ ascites & anasarca.  S/p paracentesis of 9.9 L & s/p IV albumin.   AKIon CKDIIIb: Cr is trending up from day prior.   Hx of chronic diastolic CHF: TEE showed EF 58-83%, diastolic function indeterminate & no wall motion abnormality. Continue on to hold  home dose of torsemide, metolazone.  Chronic a. Fib: comfort care only   DM2: poorly controlled. Comfort care only   Hypokalemia: no labs as pt is comfort care only   Morbid obesity: BMI 40.6. Complicates care and overall prognosis   Time: 1734 was time of death   Signed:  Wyvonnia Dusky  [facility] 2020-02-29, 7:14 PM

## 2020-02-28 NOTE — Progress Notes (Signed)
Hydromorphone drip was increased to 69m/hr in order to increase comfort.

## 2020-02-28 NOTE — Progress Notes (Signed)
Patient appears to be comfortable with his son at the bedside. Patient is breathing agonal breathing. Atropine drops are were given to help with excess secretions.

## 2020-02-28 NOTE — Plan of Care (Signed)

## 2020-02-28 NOTE — Progress Notes (Signed)
Palliative: Christian Berg, [place], is now comfort measures only.  He is lying quietly in bed, and although he is actively dying, he appears relatively comfortable.  There is no family at bedside at this time.  It seems that Mr. Hittle's time is short.  Conference with attending and bedside nursing staff related to patient condition, needs, goals of care, symptom management.   Symptom management needs addressed, orders adjusted.  Plan: Full comfort care Prognosis: Hours, anticipate in hospital death.  25 minutes Quinn Axe, NP Palliative medicine team Team phone [phone] Greater than 50% of this time was spent counseling and coordinating care related to the above assessment and plan.

## 2020-02-28 NOTE — Progress Notes (Signed)
[facility]    SUBJECTIVE: Christian Berg is a 70 year old male with a past medical history significant for longstanding persistent atrial fibrillation, currently not on anticoagulation due to bleeding risk, history of sick sinus syndrome s/p permanent pacemaker implantation, history of HFpEF, carotid artery disease, type 2 diabetes, chronic kidney disease, liver cirrhosis, and morbid obesity who presented to the ED on 02/10/2020 via EMS for altered mental status.  Per report, Christian Berg's wife noticed a few day history of progressive weakness, fatigue, and poor oral intake.  Workup in the ED was significant for a potassium of 2.9, creatinine of 2.15, BNP of 781, high sensitivity troponin borderline elevated 24, 24 respectively, ECG revealing atrial fibrillation with RVR, rate of 109bpm, chest xray revealing mild vascular congestion, and abdominal CT revealing cirrhosis with large volume ascites and anasarca.   Christian Berg is followed in outpatient cardiology by Dr. Nehemiah Massed. Most recent echocardiogram on 08/05/19 revealed normal RV and LV systolic function with an EF estimated between 60-65% with mild to moderate aortic valve sclerosis with no evidence of AS.   03/06/20:  Due to mentation, patient unable to provide any history at this time.    Vitals:   02/20/20 1428 02/20/20 1628 02/20/20 2019 03/06/20 0801  BP: (!) 96/57 (!) 103/49 (!) 90/54 (!) 80/52  Pulse: (!) 122 (!) 122 100 (!) 135  Resp:   19 (!) 22  Temp:    97.8 F (36.6 C)  TempSrc:    Axillary  SpO2:   96% 95%  Weight:      Height:         Intake/Output Summary (Last 24 hours) at March 06, 2020 0831 Last data filed at 02/20/2020 2019 Gross per 24 hour  Intake 0 ml  Output 252 ml  Net -252 ml      PHYSICAL EXAM  General: Lying in bed, asleep HEENT:  Normocephalic and atramatic Neck:   No JVD.  Lungs: Clear bilaterally to auscultation and percussion. Heart: Irregularly irregular, rapid ventricular response. Normal S1 and S2  without gallops or murmurs.  Abdomen: Bowel sounds are positive, abdomen soft and non-tender  Msk:  Back normal, normal gait. Normal strength and tone for age. Extremities: Mittens on bilateral hands, raised, dark lesions on lower legs    LABS: Basic Metabolic Panel: Recent Labs    02/19/20 0501 02/20/20 0539  NA 140 140  K 3.5 3.4*  CL 95* 94*  CO2 31 28  GLUCOSE 136* 135*  BUN 36* 39*  CREATININE 2.86* 3.24*  CALCIUM 7.7* 7.7*  MG 1.9  --   PHOS 3.2  --    Liver Function Tests: Recent Labs    02/19/20 0501  AST 45*  ALT 19  ALKPHOS 92  BILITOT 2.0*  PROT 6.0*  ALBUMIN 2.2*   No results for input(s): LIPASE, AMYLASE in the last 72 hours. CBC: Recent Labs    02/20/20 0120 02/20/20 1431  WBC 11.9* 10.7*  HGB 8.1* 7.5*  HCT 24.8* 22.3*  MCV 102.5* 100.9*  PLT 131* 116*   Cardiac Enzymes: No results for input(s): CKTOTAL, CKMB, CKMBINDEX, TROPONINI in the last 72 hours. BNP: Invalid input(s): POCBNP D-Dimer: No results for input(s): DDIMER in the last 72 hours. Hemoglobin A1C: No results for input(s): HGBA1C in the last 72 hours. Fasting Lipid Panel: No results for input(s): CHOL, HDL, LDLCALC, TRIG, CHOLHDL, LDLDIRECT in the last 72 hours. Thyroid Function Tests: No results for input(s): TSH, T4TOTAL, T3FREE, THYROIDAB in the last 72 hours.  Invalid input(s): FREET3 Anemia Panel: No results for input(s): VITAMINB12, FOLATE, FERRITIN, TIBC, IRON, RETICCTPCT in the last 72 hours.  DG Abd 1 View  Result Date: 02/20/2020 CLINICAL DATA:  Check gastric catheter placement EXAM: ABDOMEN - 1 VIEW COMPARISON:  Film from earlier in the same day. FINDINGS: Gastric catheter is been advanced further into the stomach. The stomach is partially distended with air. Colonic gas is noted as well. IMPRESSION: Gastric catheter within the stomach. Electronically Signed   By: Inez Catalina M.D.   On: 02/20/2020 17:04   DG Abd 1 View  Result Date: 02/20/2020 CLINICAL DATA:   Abdominal distension. EXAM: ABDOMEN - 1 VIEW COMPARISON:  02/19/2020 FINDINGS: An enteric tube is unchanged with tip projecting over the proximal aspect of the stomach and side hole in the expected region of the GE junction. Gas is again seen in loops of small and large bowel. Mild distension of small bowel loops has mildly decreased. No acute osseous abnormality is seen. IMPRESSION: Mildly decreased gaseous bowel distension favoring decreased ileus. Electronically Signed   By: Logan Bores M.D.   On: 02/20/2020 09:57   DG Abd 1 View  Result Date: 02/19/2020 CLINICAL DATA:  In patient encounter for small bowel obstruction EXAM: ABDOMEN - 1 VIEW COMPARISON:  Abdomen and pelvis CT from yesterday FINDINGS: Stable degree of small and large bowel gas with small bowel loops measuring up to 4.4 cm. There is an enteric tube with tip at the stomach and side port near the GE junction. IMPRESSION: 1. Unchanged degree of gaseous bowel distension. No change in the pattern to suggest evolving small-bowel obstruction, favor ileus. 2. Enteric tube with tip over the stomach and side port near the GE junction. Electronically Signed   By: Monte Fantasia M.D.   On: 02/19/2020 10:04     TELEMETRY: Atrial fibrillation, rapid ventricular response  ASSESSMENT AND PLAN:  Principal Problem:   Hepatic encephalopathy (HCC) Active Problems:   (HFpEF) heart failure with preserved ejection fraction (HCC)   Atrial fibrillation with RVR (HCC)   NASH (nonalcoholic steatohepatitis)   CKD stage 3 due to type 2 diabetes mellitus (HCC)   Obesity   Hypokalemia   Other ascites   Ileus ([place])   Palliative care by specialist   Terminal care    1.  Atrial fibrillation with RVR              -In the setting of hepatic encephalopathy, probable SBO; patient has been made comfort care             -BP remains soft, however, given concerns over probable SBO, will remain on IV amiodarone and closely follow BP/HR             -Would agree  to remain off of metoprolol              -Currently not on anticoagulation due to bleeding risk; episode of rectal bleeding reported 1/23  2.   History of HFpEF             -Most recent echocardiogram revealed preserved LV function              -Agree to hold Lasix and metolazone due to worsening renal function              -Hold metoprolol due to hypotension   3.  Chronic kidney disease             -Nephrology following   4.  Type 2  diabetes             -SSI   5.  Ascites due to cirrhosis              -Paracentesis performed   The history, physical exam findings, and plan of care were all discussed with Dr. Bartholome Bill, and all decision making was made in collaboration.    Avie Arenas  PA-C 2020/02/29 8:31 AM

## 2020-02-28 NOTE — Progress Notes (Signed)
Patient expired peacefully with son at the beside.

## 2020-02-28 NOTE — Care Management Important Message (Signed)
Important Message  Patient Details  Name: Christian Berg MRN: [id number] Date of Birth: 03/09/1950   Medicare Important Message Given:  Other (see comment)  Patient now on comfort care measures.  Medicare IM withheld at this time out of respect for patient and family.   [name] 2020-03-04, 8:38 AM

## 2020-02-28 DEATH — deceased

## 2020-04-07 IMAGING — CR DG CHEST 2V
1 series · 2 of 2 positions shown · non-contrast
Comparison: Portable chest x-ray April 01, 2017

CLINICAL DATA: Cough and sore throat began yesterday. History of
atrial fibrillation, diabetes, CHF, and TIA. Never smoked.

EXAM:
CHEST - 2 VIEW

[Series 1: dg chest 2 view · 0.14mm/px · 2 of 2 slices shown]
[im 1/2]
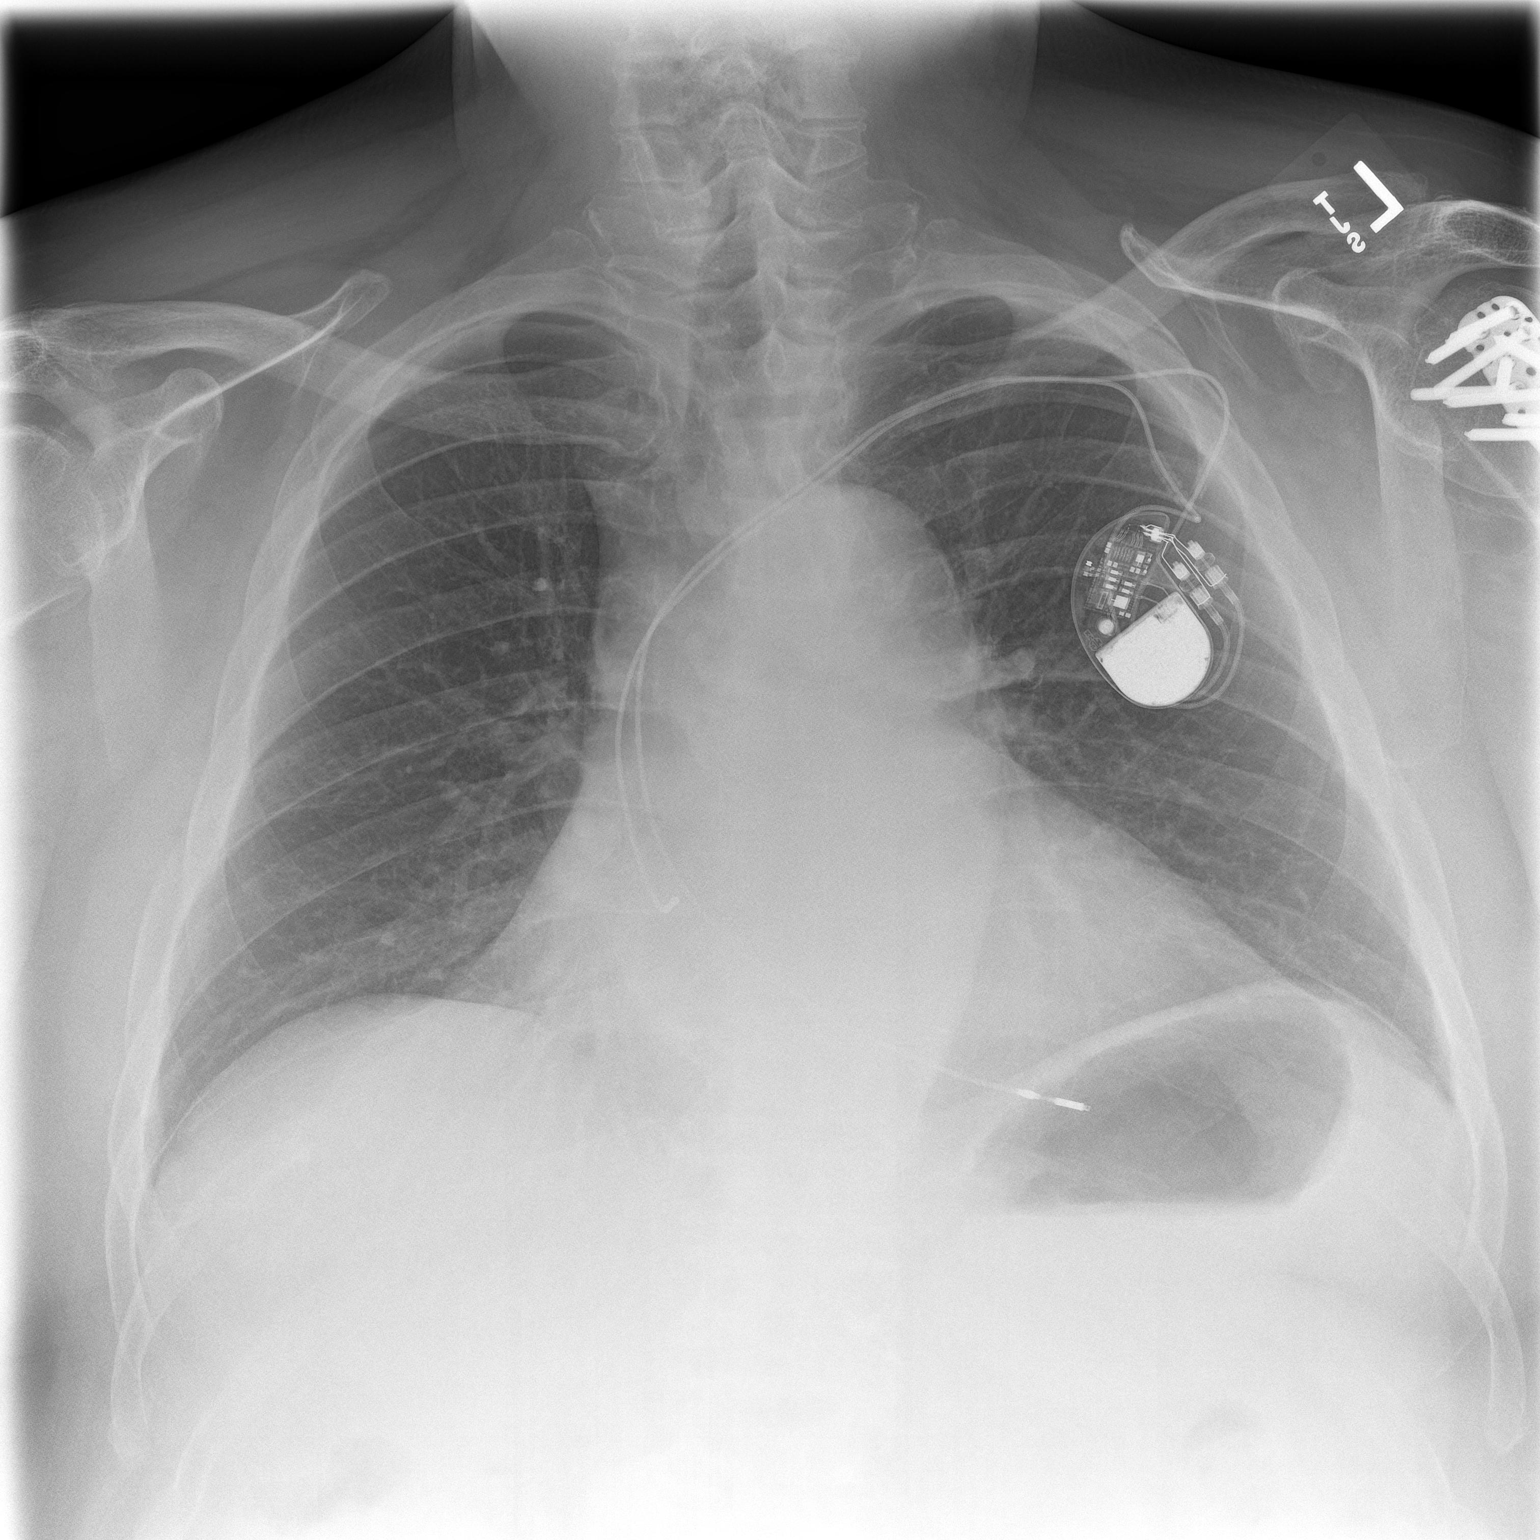
[im 2/2]
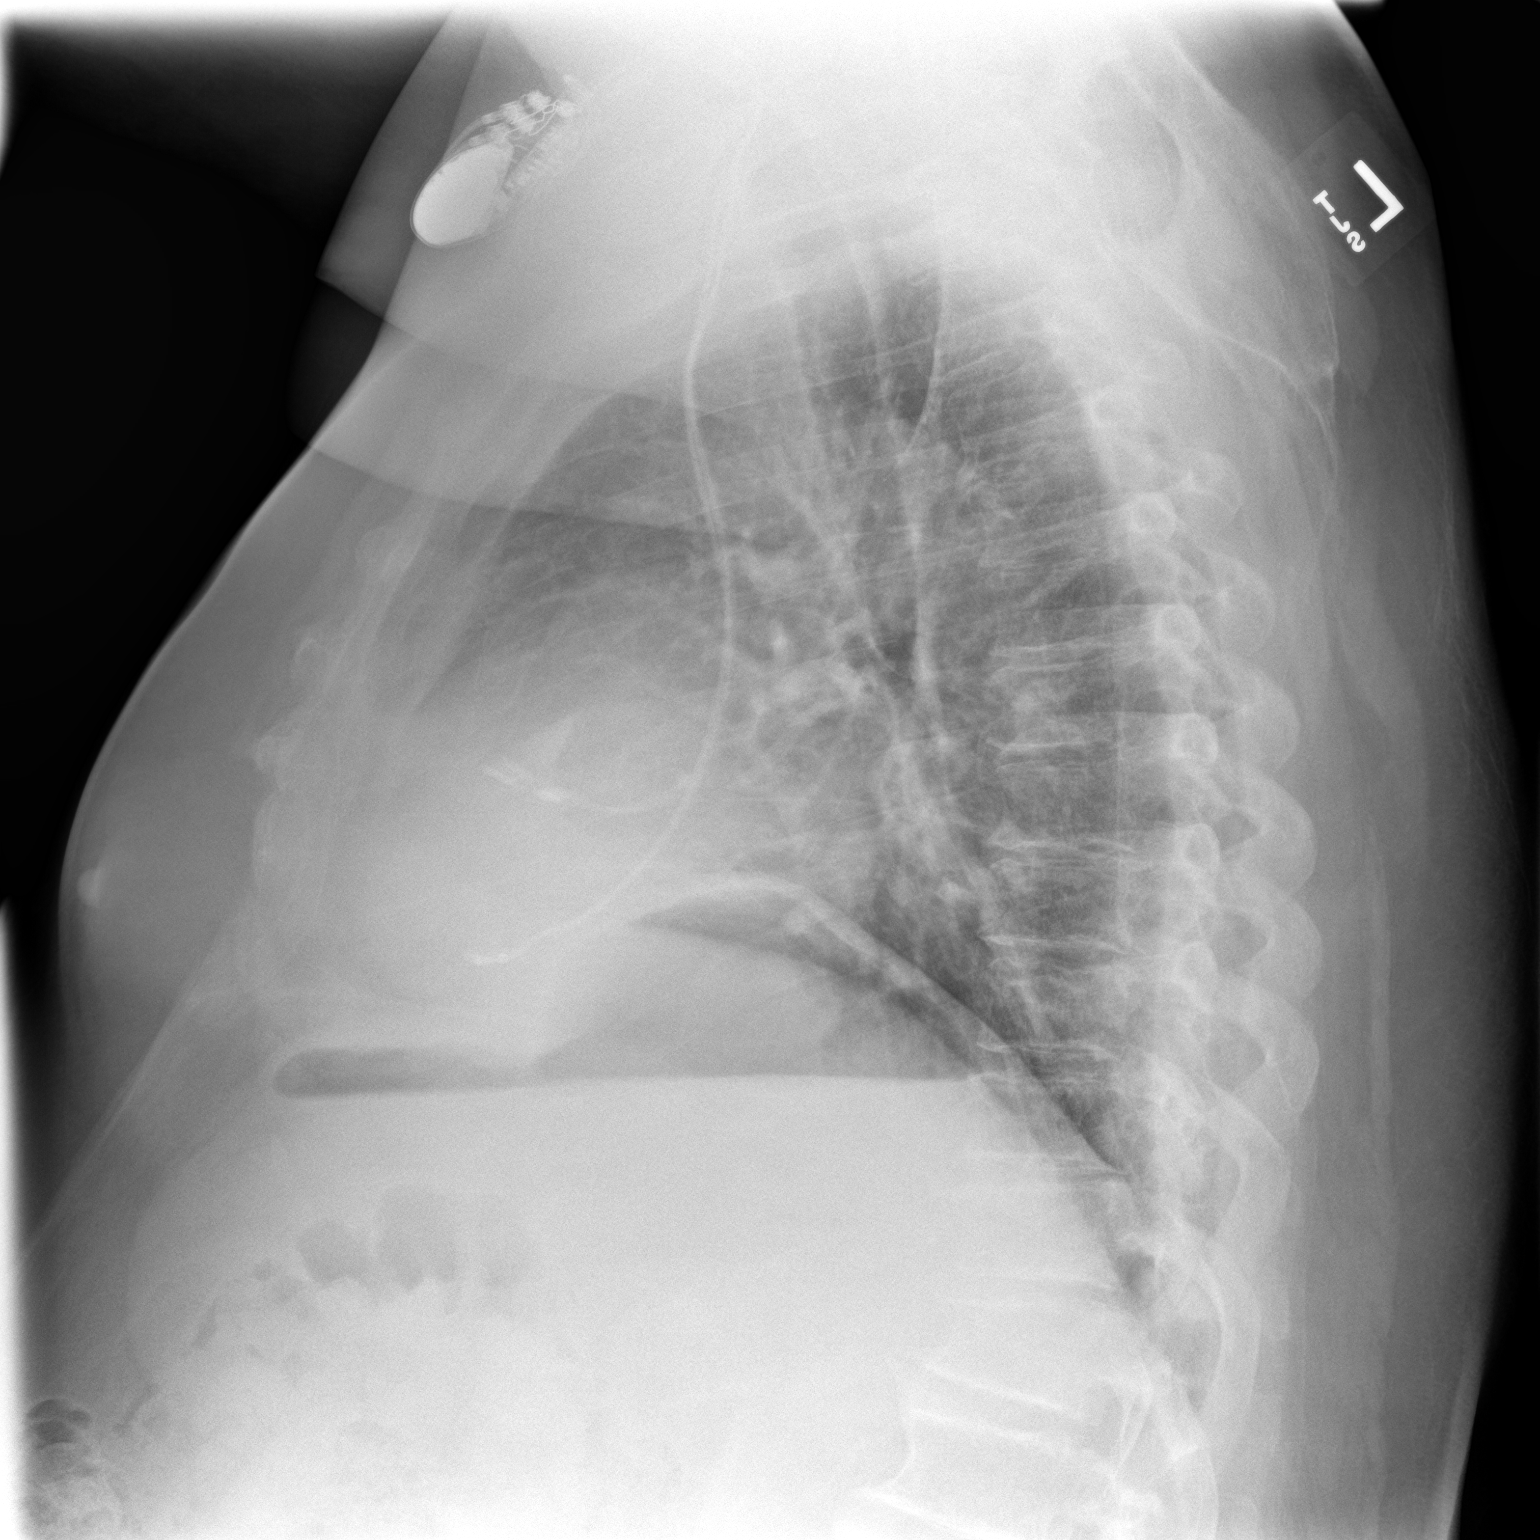

[2 of 2 positions shown; findings below may reference images not displayed]

FINDINGS: The lungs are adequately inflated and clear. The cardiac silhouette
is enlarged but stable. The pulmonary vascularity is normal. The ICD
is in stable position. There is calcification in the wall of the
thoracic aorta. The bony thorax exhibits no acute abnormality.
IMPRESSION: Stable enlargement of the cardiac silhouette. No pneumonia, CHF, nor
other acute cardiopulmonary abnormality.

Thoracic aortic atherosclerosis.
